# Patient Record
Sex: Male | Born: 1935 | Race: White | Hispanic: No | Marital: Married | Smoking: Never smoker
Health system: Southern US, Community
[De-identification: ages and names within clinical notes are randomized; demographics above are authoritative.]

## PROBLEM LIST (undated history)

## (undated) ENCOUNTER — Emergency Department: Admission: EM | Payer: PPO

## (undated) DIAGNOSIS — C801 Malignant (primary) neoplasm, unspecified: Secondary | ICD-10-CM

## (undated) DIAGNOSIS — K219 Gastro-esophageal reflux disease without esophagitis: Secondary | ICD-10-CM

## (undated) DIAGNOSIS — K635 Polyp of colon: Secondary | ICD-10-CM

## (undated) DIAGNOSIS — M1990 Unspecified osteoarthritis, unspecified site: Secondary | ICD-10-CM

## (undated) DIAGNOSIS — I493 Ventricular premature depolarization: Secondary | ICD-10-CM

## (undated) DIAGNOSIS — R002 Palpitations: Secondary | ICD-10-CM

## (undated) DIAGNOSIS — R001 Bradycardia, unspecified: Secondary | ICD-10-CM

## (undated) DIAGNOSIS — R251 Tremor, unspecified: Secondary | ICD-10-CM

## (undated) DIAGNOSIS — L570 Actinic keratosis: Secondary | ICD-10-CM

## (undated) DIAGNOSIS — F329 Major depressive disorder, single episode, unspecified: Secondary | ICD-10-CM

## (undated) DIAGNOSIS — S81811A Laceration without foreign body, right lower leg, initial encounter: Secondary | ICD-10-CM

## (undated) DIAGNOSIS — F419 Anxiety disorder, unspecified: Secondary | ICD-10-CM

## (undated) DIAGNOSIS — J449 Chronic obstructive pulmonary disease, unspecified: Secondary | ICD-10-CM

## (undated) DIAGNOSIS — N189 Chronic kidney disease, unspecified: Secondary | ICD-10-CM

## (undated) DIAGNOSIS — T7840XA Allergy, unspecified, initial encounter: Secondary | ICD-10-CM

## (undated) DIAGNOSIS — R55 Syncope and collapse: Secondary | ICD-10-CM

## (undated) DIAGNOSIS — I471 Supraventricular tachycardia, unspecified: Secondary | ICD-10-CM

## (undated) DIAGNOSIS — J310 Chronic rhinitis: Secondary | ICD-10-CM

## (undated) DIAGNOSIS — E039 Hypothyroidism, unspecified: Secondary | ICD-10-CM

## (undated) DIAGNOSIS — A4901 Methicillin susceptible Staphylococcus aureus infection, unspecified site: Secondary | ICD-10-CM

## (undated) DIAGNOSIS — Z974 Presence of external hearing-aid: Secondary | ICD-10-CM

## (undated) DIAGNOSIS — F32A Depression, unspecified: Secondary | ICD-10-CM

## (undated) DIAGNOSIS — Z87442 Personal history of urinary calculi: Secondary | ICD-10-CM

## (undated) DIAGNOSIS — Z9289 Personal history of other medical treatment: Secondary | ICD-10-CM

## (undated) DIAGNOSIS — I2510 Atherosclerotic heart disease of native coronary artery without angina pectoris: Secondary | ICD-10-CM

## (undated) DIAGNOSIS — I499 Cardiac arrhythmia, unspecified: Secondary | ICD-10-CM

## (undated) DIAGNOSIS — Z972 Presence of dental prosthetic device (complete) (partial): Secondary | ICD-10-CM

## (undated) DIAGNOSIS — C439 Malignant melanoma of skin, unspecified: Secondary | ICD-10-CM

## (undated) DIAGNOSIS — G250 Essential tremor: Secondary | ICD-10-CM

## (undated) DIAGNOSIS — E079 Disorder of thyroid, unspecified: Secondary | ICD-10-CM

## (undated) HISTORY — DX: Methicillin susceptible Staphylococcus aureus infection, unspecified site: A49.01

## (undated) HISTORY — PX: MELANOMA EXCISION: SHX5266

## (undated) HISTORY — DX: Depression, unspecified: F32.A

## (undated) HISTORY — DX: Disorder of thyroid, unspecified: E07.9

## (undated) HISTORY — DX: Actinic keratosis: L57.0

## (undated) HISTORY — PX: ADENOIDECTOMY: SUR15

## (undated) HISTORY — DX: Malignant melanoma of skin, unspecified: C43.9

## (undated) HISTORY — DX: Essential tremor: G25.0

## (undated) HISTORY — DX: Syncope and collapse: R55

## (undated) HISTORY — DX: Palpitations: R00.2

## (undated) HISTORY — DX: Personal history of other medical treatment: Z92.89

## (undated) HISTORY — PX: PATELLECTOMY: SHX1022

## (undated) HISTORY — DX: Anxiety disorder, unspecified: F41.9

## (undated) HISTORY — DX: Tremor, unspecified: R25.1

## (undated) HISTORY — DX: Ventricular premature depolarization: I49.3

## (undated) HISTORY — PX: BRAIN SURGERY: SHX531

## (undated) HISTORY — DX: Gastro-esophageal reflux disease without esophagitis: K21.9

## (undated) HISTORY — DX: Bradycardia, unspecified: R00.1

## (undated) HISTORY — DX: Polyp of colon: K63.5

## (undated) HISTORY — DX: Supraventricular tachycardia, unspecified: I47.10

## (undated) HISTORY — DX: Unspecified osteoarthritis, unspecified site: M19.90

## (undated) HISTORY — DX: Chronic kidney disease, unspecified: N18.9

## (undated) HISTORY — DX: Laceration without foreign body, right lower leg, initial encounter: S81.811A

## (undated) HISTORY — DX: Atherosclerotic heart disease of native coronary artery without angina pectoris: I25.10

## (undated) HISTORY — DX: Major depressive disorder, single episode, unspecified: F32.9

## (undated) HISTORY — DX: Allergy, unspecified, initial encounter: T78.40XA

## (undated) HISTORY — DX: Malignant (primary) neoplasm, unspecified: C80.1

## (undated) HISTORY — PX: TONSILLECTOMY: SUR1361

## (undated) HISTORY — PX: OTHER SURGICAL HISTORY: SHX169

---

## 2000-03-13 DIAGNOSIS — C801 Malignant (primary) neoplasm, unspecified: Secondary | ICD-10-CM

## 2000-03-13 HISTORY — DX: Malignant (primary) neoplasm, unspecified: C80.1

## 2001-03-13 HISTORY — PX: JOINT REPLACEMENT: SHX530

## 2010-05-10 DIAGNOSIS — Z85820 Personal history of malignant melanoma of skin: Secondary | ICD-10-CM | POA: Insufficient documentation

## 2010-05-10 DIAGNOSIS — L570 Actinic keratosis: Secondary | ICD-10-CM | POA: Insufficient documentation

## 2010-05-10 DIAGNOSIS — L820 Inflamed seborrheic keratosis: Secondary | ICD-10-CM | POA: Insufficient documentation

## 2012-12-11 ENCOUNTER — Ambulatory Visit: Payer: Self-pay | Admitting: *Deleted

## 2013-04-28 ENCOUNTER — Encounter: Payer: Self-pay | Admitting: Adult Health

## 2013-04-28 ENCOUNTER — Ambulatory Visit (INDEPENDENT_AMBULATORY_CARE_PROVIDER_SITE_OTHER): Payer: Commercial Managed Care - HMO | Admitting: Adult Health

## 2013-04-28 VITALS — BP 108/62 | HR 62 | Temp 98.0°F | Resp 16 | Ht 75.5 in | Wt 183.5 lb

## 2013-04-28 DIAGNOSIS — E291 Testicular hypofunction: Secondary | ICD-10-CM

## 2013-04-28 DIAGNOSIS — R7989 Other specified abnormal findings of blood chemistry: Secondary | ICD-10-CM

## 2013-04-28 DIAGNOSIS — F341 Dysthymic disorder: Secondary | ICD-10-CM

## 2013-04-28 DIAGNOSIS — F418 Other specified anxiety disorders: Secondary | ICD-10-CM | POA: Insufficient documentation

## 2013-04-28 DIAGNOSIS — R0982 Postnasal drip: Secondary | ICD-10-CM

## 2013-04-28 DIAGNOSIS — J310 Chronic rhinitis: Secondary | ICD-10-CM | POA: Insufficient documentation

## 2013-04-28 NOTE — Progress Notes (Signed)
Pre visit review using our clinic review tool, if applicable. No additional management support is needed unless otherwise documented below in the visit note. 

## 2013-04-28 NOTE — Progress Notes (Signed)
Patient ID: Theodore [name], male   DOB: Jul 19, 1929, 80 y.o.   MRN: [id number]    Subjective:    Patient ID: Theodore [name], male    DOB: Mar 15, 1929, 80 y.o.   MRN: [id number]  HPI  Theodore [name] is a pleasant 80 y/o male who presents to clinic to establish care. He reports a hx of depression mainly situational. Currently on medication. Depression related to adjusting to retirement. He has always been very active and feels he is not contributing. 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 Pt also reports hx of low testosterone and has been on replacement tx for ~ 2 years. Reports having his medicare wellness exam back in October. Problems with arthritis but well controlled. He is planning trip to [place] in March to visit his daughter.    Past Medical History  Diagnosis Date  . Arthritis   . Cancer 2002    melanoma right ear  . Depression   . Thyroid disease     hypothyroid  . Colon polyp   . Tremors of nervous system     Benign     Past Surgical History  Procedure Laterality Date  . Joint replacement Right 2003    knee  . Melanoma excision Right     ear. Followed by Dr. Nehemiah Massed     Family History  Problem Relation Age of Onset  . Hypertension Mother   . Heart disease Mother     CHF  . Heart disease Father   . Cancer Sister     melanoma     History   Social History  . Marital Status: Married    Spouse Name: [name]    Number of Children: 4  . Years of Education: 16   Occupational History  . Retired     Architect -    Social History Main Topics  . Smoking status: Never Smoker   . Smokeless tobacco: Never Used  . Alcohol Use: Yes     Comment: 1 glass of wine daily  . Drug Use: No  . Sexual Activity: Not on file   Other Topics Concern  . Not on file   Social History Narrative   Theodore [name] grew up in [place], [place]. 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 He and his wife are living at [facility]. He is very active at [facility] [employer] [facility]. He enjoys music.      Review of Systems  Constitutional: Negative.   HENT: Positive for hearing loss, postnasal drip and rhinorrhea.        Wears hearing aids  Eyes: Negative.   Respiratory: Negative.   Cardiovascular: Negative.   Gastrointestinal: Negative.   Endocrine: Negative.   Genitourinary: Negative.   Musculoskeletal: Positive for arthralgias (arthritis in several joints. Well controlled).  Skin: Negative.   Allergic/Immunologic: Negative.   Neurological: Negative.   Hematological: Negative.   Psychiatric/Behavioral: Negative for suicidal ideas and sleep disturbance. The patient is nervous/anxious (Depression - having trouble adjusting to retirement).        Objective:  There were no vitals taken for this visit.   Physical Exam  Constitutional: He is oriented to person, place, and time. He appears well-developed and well-nourished. No distress.  HENT:  Head: Normocephalic and atraumatic.  Right  Ear: External ear normal.  Left Ear: External ear normal.  Nose: Nose normal.  Mouth/Throat: Oropharynx is clear and moist.  Eyes: Conjunctivae and EOM are normal. Pupils are equal, round, and reactive to light.  Neck: Normal range of motion. Neck supple. No tracheal deviation present. No thyromegaly present.  Cardiovascular: Normal rate, regular rhythm, normal heart sounds and intact distal pulses.  Exam reveals no gallop and no friction rub.   No murmur heard. Pulmonary/Chest: Effort normal and breath sounds normal. No respiratory distress. He has no wheezes. He has no rales.  Abdominal: Soft. Bowel sounds are normal. He exhibits no distension and no mass. There is no tenderness. There is no rebound and no guarding.    Musculoskeletal: Normal range of motion. He exhibits no edema and no tenderness.  Lymphadenopathy:    He has no cervical adenopathy.  Neurological: He is alert and oriented to person, place, and time. He has normal reflexes. No cranial nerve deficit. Coordination normal.  Skin: Skin is warm and dry.  Psychiatric: He has a normal mood and affect. His behavior is normal. Judgment and thought content normal.       Assessment & Plan:   1. Depression with anxiety Doing well on currents meds. Reports these occur based on certain situations. He is having difficulty with adjusting to retirement. He stays very busy with exercise, activities within his retirement community and volunteer work. He also does some consulting work. Continue to follow.  2. Low serum testosterone level Last time levels were elevated. Will recheck testosterone.  - Testosterone; Future  3. Post-nasal drip Start nasonex. Sample provided. If this help will call in prescription for same.

## 2013-04-28 NOTE — Patient Instructions (Signed)
   Thank you for choosing [facility] for your health care needs.  Please have your labs drawn for your testosterone level.  The results will be available through MyChart for your convenience. Please remember to activate this. The activation code is located at the end of this form.  Nasonex 2 sprays into each nostril daily. If this helps I will send in a prescription for you.

## 2013-04-29 ENCOUNTER — Other Ambulatory Visit: Payer: Commercial Managed Care - HMO

## 2013-04-30 ENCOUNTER — Other Ambulatory Visit (INDEPENDENT_AMBULATORY_CARE_PROVIDER_SITE_OTHER): Payer: Commercial Managed Care - HMO

## 2013-04-30 ENCOUNTER — Encounter: Payer: Self-pay | Admitting: *Deleted

## 2013-04-30 DIAGNOSIS — R7989 Other specified abnormal findings of blood chemistry: Secondary | ICD-10-CM

## 2013-04-30 DIAGNOSIS — E291 Testicular hypofunction: Secondary | ICD-10-CM

## 2013-04-30 LAB — TESTOSTERONE: Testosterone: 673.38 ng/dL (ref 350.00–890.00)

## 2013-05-07 ENCOUNTER — Other Ambulatory Visit: Payer: Self-pay | Admitting: Adult Health

## 2013-05-07 ENCOUNTER — Telehealth: Payer: Self-pay | Admitting: Adult Health

## 2013-05-07 MED ORDER — BUPROPION HCL ER (XL) 300 MG PO TB24: 300.0000 mg | ORAL_TABLET | Freq: Every day | ORAL | Status: DC

## 2013-05-07 MED ORDER — LEVOTHYROXINE SODIUM 100 MCG PO TABS: 100.0000 ug | ORAL_TABLET | Freq: Every day | ORAL | Status: DC

## 2013-05-07 MED ORDER — PHENYTOIN SODIUM EXTENDED 100 MG PO CAPS: 200.0000 mg | ORAL_CAPSULE | Freq: Every day | ORAL | Status: DC

## 2013-05-07 NOTE — Telephone Encounter (Signed)
yes

## 2013-05-07 NOTE — Telephone Encounter (Signed)
Ok refill phenytoin?

## 2013-05-07 NOTE — Telephone Encounter (Signed)
Pt came into office, states [facility] advised him to come to the office to let us know that we should be receiving a fax from them.  Pt used [facility] on [address] Previously but will now have [facility] for some things.    [facility]: Bupropion Phenetoin Levothyroxine

## 2013-05-09 ENCOUNTER — Telehealth: Payer: Self-pay | Admitting: Adult Health

## 2013-05-09 MED ORDER — LEVOTHYROXINE SODIUM 100 MCG PO TABS: 100.0000 ug | ORAL_TABLET | Freq: Every day | ORAL | Status: DC

## 2013-05-09 NOTE — Telephone Encounter (Signed)
Pt came in today needing to get refill on the follow rx.  He is going out of town on Tuesday and needs rx prior to that.  He has mail order coming but needs before  levothroxine 100 mcg  Tablet   [facility] days [facility]

## 2013-07-15 ENCOUNTER — Other Ambulatory Visit: Payer: Self-pay | Admitting: Adult Health

## 2013-07-17 ENCOUNTER — Other Ambulatory Visit: Payer: Self-pay

## 2013-07-17 ENCOUNTER — Telehealth: Payer: Self-pay

## 2013-07-17 MED ORDER — TESTOSTERONE CYPIONATE 100 MG/ML IM SOLN: INTRAMUSCULAR | Status: DC

## 2013-07-17 NOTE — Telephone Encounter (Signed)
Rx was not sent to [facility], Rx was printed and needed approval from [facility] . The approval was denied. Patient needs to see the provider that prescribed the medication.

## 2013-07-17 NOTE — Telephone Encounter (Signed)
Patient called asking for a refill for his Testostrone CYP injection 2,000mg . Patient last lab was done Feb.2015. Rx was sent to [facility] mail order pharmacy as patient requested.

## 2013-07-17 NOTE — Telephone Encounter (Signed)
Spoke with Patient and notifed him that his Rx request was denied. Asked patient if he would like a referral for urologist. Patient stated no at this trime. He will contact the provider ( Dr. Ronny Flurry [place]) that has been prescribing it to him for years. Told patient to call back if he changes his mind about getting a referral.

## 2013-07-18 ENCOUNTER — Telehealth: Payer: Self-pay

## 2013-07-18 ENCOUNTER — Other Ambulatory Visit: Payer: Self-pay | Admitting: Adult Health

## 2013-07-18 DIAGNOSIS — R7989 Other specified abnormal findings of blood chemistry: Secondary | ICD-10-CM

## 2013-07-18 NOTE — Telephone Encounter (Signed)
Patient called yesterday requesting a Rx for testosterone injection and we denied his request. Offer to put in a referral for the patient to see a urologist. Patient would like a referral at this time.

## 2013-07-31 ENCOUNTER — Ambulatory Visit (INDEPENDENT_AMBULATORY_CARE_PROVIDER_SITE_OTHER): Payer: Commercial Managed Care - HMO | Admitting: Adult Health

## 2013-07-31 ENCOUNTER — Encounter: Payer: Self-pay | Admitting: Adult Health

## 2013-07-31 ENCOUNTER — Encounter (INDEPENDENT_AMBULATORY_CARE_PROVIDER_SITE_OTHER): Payer: Self-pay

## 2013-07-31 VITALS — BP 118/60 | HR 72 | Temp 98.2°F | Resp 14 | Ht 75.5 in | Wt 182.0 lb

## 2013-07-31 DIAGNOSIS — E559 Vitamin D deficiency, unspecified: Secondary | ICD-10-CM | POA: Insufficient documentation

## 2013-07-31 DIAGNOSIS — R5383 Other fatigue: Secondary | ICD-10-CM | POA: Insufficient documentation

## 2013-07-31 DIAGNOSIS — R5381 Other malaise: Secondary | ICD-10-CM

## 2013-07-31 DIAGNOSIS — F341 Dysthymic disorder: Secondary | ICD-10-CM

## 2013-07-31 DIAGNOSIS — R0982 Postnasal drip: Secondary | ICD-10-CM

## 2013-07-31 DIAGNOSIS — F418 Other specified anxiety disorders: Secondary | ICD-10-CM

## 2013-07-31 LAB — CBC WITH DIFFERENTIAL/PLATELET
Basophils Absolute: 0 10*3/uL (ref 0.0–0.1)
Basophils Relative: 0.5 % (ref 0.0–3.0)
Eosinophils Absolute: 0.2 10*3/uL (ref 0.0–0.7)
Eosinophils Relative: 3.2 % (ref 0.0–5.0)
HCT: 48.1 % (ref 39.0–52.0)
Hemoglobin: 16.8 g/dL (ref 13.0–17.0)
Lymphocytes Relative: 16.1 % (ref 12.0–46.0)
Lymphs Abs: 0.8 10*3/uL (ref 0.7–4.0)
MCHC: 34.9 g/dL (ref 30.0–36.0)
MCV: 98.3 fl (ref 78.0–100.0)
Monocytes Absolute: 0.4 10*3/uL (ref 0.1–1.0)
Monocytes Relative: 7 % (ref 3.0–12.0)
Neutro Abs: 3.8 10*3/uL (ref 1.4–7.7)
Neutrophils Relative %: 73.2 % (ref 43.0–77.0)
Platelets: 210 10*3/uL (ref 150.0–400.0)
RBC: 4.89 Mil/uL (ref 4.22–5.81)
RDW: 12.8 % (ref 11.5–15.5)
WBC: 5.2 10*3/uL (ref 4.0–10.5)

## 2013-07-31 LAB — BASIC METABOLIC PANEL
BUN: 21 mg/dL (ref 6–23)
CO2: 28 mEq/L (ref 19–32)
Calcium: 8.9 mg/dL (ref 8.4–10.5)
Chloride: 104 mEq/L (ref 96–112)
Creatinine, Ser: 1.3 mg/dL (ref 0.4–1.5)
GFR: 58.75 mL/min — ABNORMAL LOW (ref >60.00)
Glucose, Bld: 99 mg/dL (ref 70–99)
Potassium: 4.5 mEq/L (ref 3.5–5.1)
Sodium: 139 mEq/L (ref 135–145)

## 2013-07-31 LAB — TSH: TSH: 0.44 u[IU]/mL (ref 0.35–4.50)

## 2013-07-31 NOTE — Progress Notes (Signed)
Pre visit review using our clinic review tool, if applicable. No additional management support is needed unless otherwise documented below in the visit note. 

## 2013-07-31 NOTE — Patient Instructions (Addendum)
  Please have your blood drawn prior to leaving the office.  We will contact you with results once they are available.  For allergies try taking Allegra. This is over the counter.  Continue using the nettie pot.

## 2013-07-31 NOTE — Progress Notes (Signed)
Subjective:    Patient ID: Theodore [name], male    DOB: 01/27/1930, 80 y.o.   MRN: [id number]  HPI Theodore [name] is a pleasant 80 y/o male who presents to clinic with ongoing fatigue. Hx of low testosterone on weekly replacement, depression with anxiety (on wellbutrin and alprazolam). Has been taking alprazolam as needed with good results. Has only had to take it approximately twice in the last month. He has an appointment with urology next week to manage his testosterone. Fatigue is atypical for him. He exercises regularly and has been noticing feeling completely wiped out after his workout. He denies hx of anemia. Has hypothyroidism on levothyroxine. He is taking medication exactly as prescribed.  Pt also reports ongoing post nasal drip. Using nettie pot with improvement but the nasacort did not help any. Feels the post nasal drip causing cough. No fever, chills, shortness of breath. He does not have any sinus pressure or pain.   Past Medical History  Diagnosis Date  . Arthritis   . Cancer 2002    melanoma right ear  . Depression   . Thyroid disease     hypothyroid  . Colon polyp   . Tremors of nervous system     Benign    Review of Systems  Constitutional: Positive for fatigue. Negative for fever and chills.  HENT: Negative.   Eyes: Negative.   Respiratory: Negative.   Cardiovascular: Negative.   Gastrointestinal: Negative.   Endocrine: Negative.   Genitourinary: Negative.   Musculoskeletal: Negative.        Exercises regularly but feels wiped out following. This is a new finding for him.  Skin: Negative.   Allergic/Immunologic: Negative.   Neurological: Negative.   Hematological: Negative.   Psychiatric/Behavioral: The patient is nervous/anxious (controlled with alprazolam).        Depression controlled with wellbutrin       Objective:   Physical Exam  Constitutional: He is oriented to person, place, and time. He appears well-developed and well-nourished. No distress.    HENT:  Head: Normocephalic and atraumatic.  Eyes: Conjunctivae and EOM are normal.  Cardiovascular: Normal rate, regular rhythm, normal heart sounds and intact distal pulses.  Exam reveals no gallop and no friction rub.   No murmur heard. Pulmonary/Chest: Effort normal and breath sounds normal. No respiratory distress. He has no wheezes. He has no rales.  Musculoskeletal: Normal range of motion.  Neurological: He is alert and oriented to person, place, and time.  Skin: Skin is warm and dry.  Psychiatric: He has a normal mood and affect. His behavior is normal. Judgment and thought content normal.   BP 118/60  Pulse 72  Temp(Src) 98.2 F (36.8 C) (Oral)  Resp 14  Ht 6' 3.5" (1.918 m)  Wt 182 lb (82.555 kg)  BMI 22.44 kg/m2  SpO2 97%     Assessment & Plan:   1. Fatigue Multifactorial. Could be his testosterone which he has appointment next week. Last testosterone level was in the 600s. I will check his thyroid and for anemia. Make sure his electrolytes are stable. Fatigue may also be symptom of depression. Still has some symptoms but feels they are better controlled.  - TSH - CBC with Differential - Basic metabolic panel  2. Vitamin D deficiency Has been on 5000 units of D3. Will check levels today to make sure they are not too elevated. - Vit D  25 hydroxy (rtn osteoporosis monitoring)  3. Depression with anxiety As above, he is improved. Anxiety  better controlled with less episodes occuring. Exercise is a good outlet for him although his fatigue is hindering this a bit. Continue to follow.  4. Post-nasal drip Nasocort is not helping. I have asked patient to try Allegra without the Decongestant. Continue with the nettie pot. Allergy symptoms can also be contributing to him feeling somewhat run down. Unfortunately, pollen levels have been very high. Need to avoid exposure as much as he is able.

## 2013-08-01 LAB — VITAMIN D 25 HYDROXY (VIT D DEFICIENCY, FRACTURES): Vit D, 25-Hydroxy: 42 ng/mL (ref 30–89)

## 2013-08-07 ENCOUNTER — Other Ambulatory Visit: Payer: Self-pay | Admitting: Adult Health

## 2013-08-08 DIAGNOSIS — E291 Testicular hypofunction: Secondary | ICD-10-CM | POA: Insufficient documentation

## 2013-08-08 DIAGNOSIS — N138 Other obstructive and reflux uropathy: Secondary | ICD-10-CM | POA: Insufficient documentation

## 2013-08-08 DIAGNOSIS — N401 Enlarged prostate with lower urinary tract symptoms: Secondary | ICD-10-CM | POA: Insufficient documentation

## 2013-09-08 ENCOUNTER — Encounter: Payer: Self-pay | Admitting: Adult Health

## 2013-09-08 ENCOUNTER — Ambulatory Visit (INDEPENDENT_AMBULATORY_CARE_PROVIDER_SITE_OTHER): Payer: Commercial Managed Care - HMO | Admitting: Adult Health

## 2013-09-08 VITALS — BP 120/62 | HR 66 | Temp 97.9°F | Resp 16 | Wt 183.5 lb

## 2013-09-08 DIAGNOSIS — E291 Testicular hypofunction: Secondary | ICD-10-CM

## 2013-09-08 DIAGNOSIS — R0982 Postnasal drip: Secondary | ICD-10-CM

## 2013-09-08 DIAGNOSIS — R5383 Other fatigue: Secondary | ICD-10-CM

## 2013-09-08 DIAGNOSIS — F418 Other specified anxiety disorders: Secondary | ICD-10-CM

## 2013-09-08 DIAGNOSIS — R5381 Other malaise: Secondary | ICD-10-CM

## 2013-09-08 DIAGNOSIS — R7989 Other specified abnormal findings of blood chemistry: Secondary | ICD-10-CM

## 2013-09-08 DIAGNOSIS — F341 Dysthymic disorder: Secondary | ICD-10-CM

## 2013-09-08 MED ORDER — IPRATROPIUM BROMIDE 0.03 % NA SOLN: 2.0000 | Freq: Two times a day (BID) | NASAL | Status: DC

## 2013-09-08 NOTE — Patient Instructions (Addendum)
  Return on Thursday or Monday for your labs. Remember to schedule a lab appointment.  Start Atrovent nasal spray 2 sprays into each nostril every 12 hours.  If symptoms are not improved I will refer you to ENT (ear, nose and throat) specialist.  Continue increasing your physical activity gradually. Being on antibiotics can make you feel sluggish and without energy.  Return for follow up evaluation in 6 month or sooner if necessary.

## 2013-09-08 NOTE — Progress Notes (Signed)
Pre visit review using our clinic review tool, if applicable. No additional management support is needed unless otherwise documented below in the visit note. 

## 2013-09-08 NOTE — Progress Notes (Signed)
Patient ID: Theodore [name], male   DOB: 1929-10-02, 80 y.o.   MRN: [id number]    Subjective:    Patient ID: Theodore [name], male    DOB: [birth year], 80 y.o.   MRN: [id number]  HPI  Pt is a pleasant 80 y/o male who presents to clinic for follow up on several chronic problems:  1. Low testosterone Pt reports that he is being followed by urology for same. Reports that his previous PCP prescribed .05 mg weekly to maintain his levels more steady. He is currently on this dose every 10 days. Given recent studies about testosterone and cardiac implications, he reports that Urologist was reluctant to change his dose. He would like to have his level drawn midweek to see if they drop significantly.  2. Other fatigue Has been experiencing fatigue. Reports that he had a root canal on 08/18/13 and was started on Amoxicillin tid. He felt totally wiped out. No energy. He is very active. Exercises daily and was unable to do anything. He reports being off antibiotic for 14 days now. Saturday he began to feel improvement in energy. He has attended 2 exercise classes today and did very well.  3. Post-nasal drip Still having considerable post nasal drip and rhinorrhea. Tried allegra as we discussed but this did not help. Also tried flonase but this was also ineffective. He reports that when he is exercising and has to lie on his back he feels the drainage trickling and makes him cough  5. Depression with anxiety This is controlled at present. Only takes the alprazolam occasionally. He rarely feels he needs this. Has considered weaning off wellbutrin but does not want to feel like he was feeling a year ago.    Past Medical History  Diagnosis Date  . Arthritis   . Cancer 2002    melanoma right ear  . Depression   . Thyroid disease     hypothyroid  . Colon polyp   . Tremors of nervous system     Benign    Current Outpatient Prescriptions on File Prior to Visit  Medication Sig Dispense Refill  . ALPRAZolam  (XANAX) 0.25 MG tablet Take 0.25 mg by mouth daily as needed for anxiety.      [name] buPROPion (WELLBUTRIN XL) 300 MG 24 hr tablet TAKE 1 TABLET EVERY DAY  90 tablet  0  . cholecalciferol (VITAMIN D) 1000 UNITS tablet Take 1,000 Units by mouth daily.      [name] co-enzyme Q-10 30 MG capsule Take 30 mg by mouth 3 (three) times daily.      . COD LIVER OIL PO Take by mouth.      . cyanocobalamin 500 MCG tablet Take 500 mcg by mouth daily.      . Flaxseed, Linseed, (FLAX SEEDS PO) Take by mouth.      . Hydrogen Peroxide 30 % SOLN by Does not apply route. 5 drops per day in water (food grade)      . Iodine, Kelp, 0.15 MG TABS Take 1 tablet by mouth daily.      [name] levothyroxine (SYNTHROID, LEVOTHROID) 100 MCG tablet TAKE 1 TABLET DAILY BEFORE BREAKFAST.  90 tablet  1  . magnesium oxide (MAG-OX) 400 MG tablet Take 400 mg by mouth daily.      [name] OVER THE COUNTER MEDICATION Tumeric daily      . OVER THE COUNTER MEDICATION Standard Process- Cardio Plus      . OVER THE COUNTER MEDICATION Standard Process- [place]      .  OVER THE COUNTER MEDICATION Standard Process- Folic Acid      . OVER THE COUNTER MEDICATION Standard PRocess- Prosynbiotic      . phenytoin (DILANTIN) 100 MG ER capsule Take 2 capsules (200 mg total) by mouth daily.  60 capsule  6  . S-Adenosylmethionine (SAM-E PO) Take by mouth.      . testosterone cypionate (DEPOTESTOTERONE CYPIONATE) 100 MG/ML injection For IM use only, every 10 days Inject 50 mg into the muscle. For IM use only, every 10 days  10 mL  5  . vitamin E (VITAMIN E) 400 UNIT capsule Take 400 Units by mouth daily.       No current facility-administered medications on file prior to visit.     Review of Systems  Constitutional: Positive for fatigue (improving). Negative for fever and chills.  HENT: Positive for postnasal drip and rhinorrhea.   Eyes: Negative.   Respiratory: Positive for cough (secondary to post nasal drip).   Cardiovascular: Negative.   Gastrointestinal:  Negative.   Endocrine: Negative.   Genitourinary: Negative.   Musculoskeletal: Negative.   Skin: Negative.   Allergic/Immunologic: Negative.   Neurological: Positive for tremors (essential tremor).  Hematological: Negative.   Psychiatric/Behavioral: Negative.   All other systems reviewed and are negative.      Objective:  BP 120/62  Pulse 66  Temp(Src) 97.9 F (36.6 C) (Oral)  Resp 16  Wt 183 lb 8 oz (83.235 kg)  SpO2 98%   Physical Exam  Constitutional: He is oriented to person, place, and time. He appears well-developed and well-nourished. No distress.  HENT:  Head: Normocephalic and atraumatic.  Eyes: Conjunctivae and EOM are normal.  Cardiovascular: Normal rate, regular rhythm, normal heart sounds and intact distal pulses.  Exam reveals no gallop and no friction rub.   No murmur heard. Pulmonary/Chest: Effort normal and breath sounds normal. No respiratory distress. He has no wheezes. He has no rales.  Musculoskeletal: Normal range of motion.  Neurological: He is alert and oriented to person, place, and time.  Skin: Skin is warm and dry.  Psychiatric: He has a normal mood and affect. His behavior is normal. Judgment and thought content normal.      Assessment & Plan:   1. Low testosterone Check testosterone level midweek after administration to see where his levels are. He may benefit from splitting the dose and administering half dose weekly. He will return either on Thursday or Monday of next week.  - Testosterone; Future  2. Other fatigue Improving. I have asked him to gradually increase activity. Not to overdo it. Recent labs all looked good.  3. Post-nasal drip Try atrovent nasal spray. Use 2 sprays into each nostril every 12 hours. If no improvement will refer him to ENT.  4. Depression with anxiety Stable on medication. Continue to follow.

## 2013-09-15 ENCOUNTER — Encounter: Payer: Self-pay | Admitting: Adult Health

## 2013-09-15 ENCOUNTER — Other Ambulatory Visit (INDEPENDENT_AMBULATORY_CARE_PROVIDER_SITE_OTHER): Payer: Commercial Managed Care - HMO

## 2013-09-15 DIAGNOSIS — R7989 Other specified abnormal findings of blood chemistry: Secondary | ICD-10-CM

## 2013-09-15 DIAGNOSIS — E291 Testicular hypofunction: Secondary | ICD-10-CM

## 2013-09-15 LAB — TESTOSTERONE: Testosterone: 1037.13 ng/dL — ABNORMAL HIGH (ref 300.00–890.00)

## 2013-11-04 ENCOUNTER — Other Ambulatory Visit: Payer: Self-pay | Admitting: Adult Health

## 2013-11-04 ENCOUNTER — Encounter: Payer: Self-pay | Admitting: Adult Health

## 2013-11-04 DIAGNOSIS — Z85820 Personal history of malignant melanoma of skin: Secondary | ICD-10-CM

## 2013-11-04 DIAGNOSIS — Z9889 Other specified postprocedural states: Secondary | ICD-10-CM

## 2013-11-04 NOTE — Telephone Encounter (Signed)
Patient requesting a referral for [facility]. Please advise.

## 2013-11-12 ENCOUNTER — Telehealth: Payer: Self-pay

## 2013-11-12 NOTE — Telephone Encounter (Signed)
The patient is approved to see Dr.Tara Nicole Kindred at [facility] [phone] Good for 6 visits through 05/13/14

## 2013-12-02 ENCOUNTER — Other Ambulatory Visit: Payer: Self-pay | Admitting: *Deleted

## 2013-12-02 MED ORDER — LEVOTHYROXINE SODIUM 100 MCG PO TABS: ORAL_TABLET | ORAL | Status: DC

## 2013-12-15 ENCOUNTER — Encounter: Payer: Self-pay | Admitting: Internal Medicine

## 2013-12-15 MED ORDER — PHENYTOIN SODIUM EXTENDED 100 MG PO CAPS: 200.0000 mg | ORAL_CAPSULE | Freq: Every day | ORAL | Status: DC

## 2013-12-15 NOTE — Telephone Encounter (Signed)
Ok to refill,  Refill sent  

## 2013-12-15 NOTE — Telephone Encounter (Signed)
Ok refill? 

## 2014-01-12 DIAGNOSIS — D039 Melanoma in situ, unspecified: Secondary | ICD-10-CM

## 2014-01-12 HISTORY — DX: Melanoma in situ, unspecified: D03.9

## 2014-01-14 ENCOUNTER — Ambulatory Visit (INDEPENDENT_AMBULATORY_CARE_PROVIDER_SITE_OTHER): Payer: Commercial Managed Care - HMO | Admitting: *Deleted

## 2014-01-14 ENCOUNTER — Ambulatory Visit (INDEPENDENT_AMBULATORY_CARE_PROVIDER_SITE_OTHER): Payer: Commercial Managed Care - HMO | Admitting: Internal Medicine

## 2014-01-14 ENCOUNTER — Encounter: Payer: Self-pay | Admitting: Internal Medicine

## 2014-01-14 ENCOUNTER — Telehealth: Payer: Self-pay | Admitting: Internal Medicine

## 2014-01-14 VITALS — BP 124/66 | HR 49 | Temp 97.4°F | Resp 14 | Ht 75.5 in | Wt 185.5 lb

## 2014-01-14 DIAGNOSIS — E291 Testicular hypofunction: Secondary | ICD-10-CM

## 2014-01-14 DIAGNOSIS — R7989 Other specified abnormal findings of blood chemistry: Secondary | ICD-10-CM

## 2014-01-14 DIAGNOSIS — Z79899 Other long term (current) drug therapy: Secondary | ICD-10-CM

## 2014-01-14 DIAGNOSIS — J310 Chronic rhinitis: Secondary | ICD-10-CM

## 2014-01-14 DIAGNOSIS — Z23 Encounter for immunization: Secondary | ICD-10-CM

## 2014-01-14 MED ORDER — IPRATROPIUM BROMIDE 0.06 % NA SOLN: 2.0000 | Freq: Four times a day (QID) | NASAL | Status: DC

## 2014-01-14 NOTE — Patient Instructions (Signed)
We are increasing the strength and dosing of the atrovent nasal spray that Theodore [name]  tried to 2 squirts 4 times daily (new rx sent)  Add 25 mg of generic benadryl at bedtime only  Please suspend your testsoterone for 6 weeks to see if you notice a major difference in your wellbeing  Let me know if you want to resume it

## 2014-01-14 NOTE — Telephone Encounter (Signed)
Theodore [name] wasn't sure if he needed lab work in the next 6wks or a follow-up appt. I didn't see anything notated in his Summary. If he needs labs or a follow-up appt, please let him know. Thank you.

## 2014-01-14 NOTE — Progress Notes (Signed)
Pre-visit discussion using our clinic review tool. No additional management support is needed unless otherwise documented below in the visit note.  

## 2014-01-14 NOTE — Telephone Encounter (Signed)
Per Dr. Derrel Nip, it will depend on when/if he restarts his Testosterone. If he restarts his Testosterone, he will need to recheck labs 6 weeks from restarting.

## 2014-01-14 NOTE — Progress Notes (Signed)
Patient ID: Theodore [name], male   DOB: 1929/04/30, 80 y.o.   MRN: [id number]   Patient Active Problem List   Diagnosis Date Noted  . Long-term use of high-risk medication 01/16/2014  . Vitamin D deficiency 07/31/2013  . Fatigue 07/31/2013  . Depression with anxiety 04/28/2013  . Low serum testosterone level 04/28/2013  . Nonallergic rhinitis 04/28/2013    Subjective:  CC:   Chief Complaint  Patient presents with  . Acute Visit    sinus drainage interferring with sleep, affecting vocal cords, happens year round.    HPI:   Theodore [name] is a 80 y.o. male who presents for   Chronic nonpurulent rhinorrhea,  Has been occurring for years, initially seasonally,  Now year round.  Feels like he has hay fever, Eyes feel dry all the time but do not itch.  Uses restasis  Aggravated by lying supine. PND causes choking and coughing Even for short periods of time,  Disruptive during yoga class.  Prior trials of   Allegra, all OTC steroid nasal sprays..  Saline irrigation    Has even seen  ENT Dr Harrington Challenger in [place] . Has been tested for environmental allergies,  All negative .  No history of head injury or other event to suspect CSF leak,     Past Medical History  Diagnosis Date  . Arthritis   . Cancer 2002    melanoma right ear  . Depression   . Thyroid disease     hypothyroid  . Colon polyp   . Tremors of nervous system     Benign    Past Surgical History  Procedure Laterality Date  . Joint replacement Right 2003    knee  . Melanoma excision Right     ear. Followed by Dr. Nehemiah Massed       The following portions of the patient's history were reviewed and updated as appropriate: Allergies, current medications, and problem list.    Review of Systems:   Patient denies headache, fevers, malaise, unintentional weight loss, skin rash, eye pain, sinus congestion and sinus pain, sore throat, dysphagia,  hemoptysis , cough, dyspnea, wheezing, chest pain, palpitations, orthopnea,  edema, abdominal pain, nausea, melena, diarrhea, constipation, flank pain, dysuria, hematuria, urinary  Frequency, nocturia, numbness, tingling, seizures,  Focal weakness, Loss of consciousness,  Tremor, insomnia, depression, anxiety, and suicidal ideation.     History   Social History  . Marital Status: Married    Spouse Name: Theodore [name]    Number of Children: 4  . Years of Education: 16   Occupational History  . Retired     Architect -    Social History Main Topics  . Smoking status: Never Smoker   . Smokeless tobacco: Never Used  . Alcohol Use: Yes     Comment: 1 glass of wine daily  . Drug Use: No  . Sexual Activity: Not on file   Other Topics Concern  . Not on file   Social History Narrative   Theodore [name] grew up in [place], [place]. 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 He and his wife are living at [facility]. He is very active at [facility]. He enjoys music.     Objective:  Filed Vitals:   01/14/14 0828  BP: 124/66  Pulse: 49  Temp: 97.4  F (36.3 C)  Resp: 14     General appearance: alert, cooperative and appears stated age Ears: normal TM's and external ear canals both ears Throat: lips, mucosa, and tongue normal; teeth and gums normal Neck: no adenopathy, no carotid bruit, supple, symmetrical, trachea midline and thyroid not enlarged, symmetric, no tenderness/mass/nodules Back: symmetric, no curvature. ROM normal. No CVA tenderness. Lungs: clear to auscultation bilaterally Heart: regular rate and rhythm, S1, S2 normal, no murmur, click, rub or gallop Abdomen: soft, non-tender; bowel sounds normal; no masses,  no organomegaly Pulses: 2+ and symmetric Skin: Skin color, texture, turgor normal. No rashes or lesions Lymph nodes: Cervical,  supraclavicular, and axillary nodes normal.  Assessment and Plan:  Nonallergic rhinitis Workup thus far has included ENT evaluation  And allergy testing, neither of which are available for review. Not clear if he has been evaluated fully for nasal tumors, vasculitis , etc.  Trial of atrovent, records requested.   Low serum testosterone level He has had evaluation in May by Dr. Bernardo Heater.  No changes were made..  Risks of long term testosterone supplementation discussed vs benefits. Trial of 6 week suspension to see if QOL changes.   Long-term use of high-risk medication He is due for repeat CBC, lipids due to chronic use of testosterone.    A total of 25 minutes of face to face time was spent with patient more than half of which was spent in counselling and coordination of care    Updated Medication List Outpatient Encounter Prescriptions as of 01/14/2014  Medication Sig  . cholecalciferol (VITAMIN D) 1000 UNITS tablet Take 5,000 Units by mouth daily.   [name] co-enzyme Q-10 30 MG capsule Take 30 mg by mouth 3 (three) times daily.  . COD LIVER OIL PO Take by mouth.  . cyanocobalamin 500 MCG tablet Take 500 mcg by mouth daily.  . Flaxseed, Linseed, (FLAX SEEDS PO) Take by mouth.  . Hydrogen Peroxide 30 % SOLN by Does not apply route. 5 drops per day in water (food grade)  . Iodine, Kelp, 0.15 MG TABS Take 1 tablet by mouth daily.  [name] ipratropium (ATROVENT) 0.03 % nasal spray Place 2 sprays into both nostrils every 12 (twelve) hours.  [name] levothyroxine (SYNTHROID, LEVOTHROID) 100 MCG tablet TAKE 1 TABLET DAILY BEFORE BREAKFAST.  . magnesium oxide (MAG-OX) 400 MG tablet Take 400 mg by mouth daily.  [name] OVER THE COUNTER MEDICATION Tumeric daily  . OVER THE COUNTER MEDICATION Standard Process- Cardio Plus  . OVER THE COUNTER MEDICATION Standard Process- [place]  . OVER THE COUNTER MEDICATION Standard Process- Folic Acid  . OVER THE COUNTER MEDICATION Standard PRocess- Prosynbiotic  . phenytoin  (DILANTIN) 100 MG ER capsule Take 2 capsules (200 mg total) by mouth daily. (Patient taking differently: Take 100 mg by mouth daily. )  . Probiotic Product (PROBIOTIC ACIDOPHILUS) CAPS Take 1 capsule by mouth daily.  . S-Adenosylmethionine (SAM-E PO) Take by mouth.  . testosterone cypionate (DEPOTESTOTERONE CYPIONATE) 100 MG/ML injection For IM use only, every 10 days Inject 50 mg into the muscle. For IM use only, every 10 days  . vitamin E (VITAMIN E) 400 UNIT capsule Take 400 Units by mouth daily.  [name] ALPRAZolam (XANAX) 0.25 MG tablet Take 0.25 mg by mouth daily as needed for anxiety.  [name] buPROPion (WELLBUTRIN XL) 300 MG 24 hr tablet TAKE 1 TABLET EVERY DAY  . ipratropium (ATROVENT) 0.06 % nasal spray Place 2 sprays into both nostrils 4 (four) times daily.  Orders Placed This Encounter  Procedures  . Pneumococcal conjugate vaccine 13-valent  . Testosterone, free, total  . Lipid panel  . CBC with Differential  . Comp Met (CMET)    No Follow-up on file.

## 2014-01-16 DIAGNOSIS — Z79899 Other long term (current) drug therapy: Secondary | ICD-10-CM | POA: Insufficient documentation

## 2014-01-16 NOTE — Assessment & Plan Note (Addendum)
He has had evaluation in May by Dr. Bernardo Heater.  No changes were made..  Risks of long term testosterone supplementation discussed vs benefits. Trial of 6 week suspension to see if QOL changes.

## 2014-01-16 NOTE — Assessment & Plan Note (Addendum)
Workup thus far has included ENT evaluation  And allergy testing, neither of which are available for review. Not clear if he has been evaluated fully for nasal tumors, vasculitis , etc.  Trial of atrovent, records requested.

## 2014-01-16 NOTE — Assessment & Plan Note (Signed)
He is due for repeat CBC, lipids due to chronic use of testosterone.

## 2014-01-26 ENCOUNTER — Other Ambulatory Visit (INDEPENDENT_AMBULATORY_CARE_PROVIDER_SITE_OTHER): Payer: Commercial Managed Care - HMO

## 2014-01-26 DIAGNOSIS — E291 Testicular hypofunction: Secondary | ICD-10-CM

## 2014-01-26 DIAGNOSIS — Z79899 Other long term (current) drug therapy: Secondary | ICD-10-CM

## 2014-01-26 DIAGNOSIS — R7989 Other specified abnormal findings of blood chemistry: Secondary | ICD-10-CM

## 2014-01-26 LAB — COMPREHENSIVE METABOLIC PANEL
ALT: 18 U/L (ref 0–53)
AST: 21 U/L (ref 0–37)
Albumin: 4.1 g/dL (ref 3.5–5.2)
Alkaline Phosphatase: 51 U/L (ref 39–117)
BUN: 25 mg/dL — ABNORMAL HIGH (ref 6–23)
CO2: 22 mEq/L (ref 19–32)
Calcium: 9 mg/dL (ref 8.4–10.5)
Chloride: 109 mEq/L (ref 96–112)
Creatinine, Ser: 1.1 mg/dL (ref 0.4–1.5)
GFR: 69.46 mL/min (ref >60.00)
Glucose, Bld: 96 mg/dL (ref 70–99)
Potassium: 5.1 mEq/L (ref 3.5–5.1)
Sodium: 144 mEq/L (ref 135–145)
Total Bilirubin: 0.7 mg/dL (ref 0.2–1.2)
Total Protein: 6.5 g/dL (ref 6.0–8.3)

## 2014-01-26 LAB — CBC WITH DIFFERENTIAL/PLATELET
Basophils Absolute: 0 10*3/uL (ref 0.0–0.1)
Basophils Relative: 0.6 % (ref 0.0–3.0)
Eosinophils Absolute: 0.6 10*3/uL (ref 0.0–0.7)
Eosinophils Relative: 10.6 % — ABNORMAL HIGH (ref 0.0–5.0)
HCT: 47.6 % (ref 39.0–52.0)
Hemoglobin: 16.3 g/dL (ref 13.0–17.0)
Lymphocytes Relative: 25.7 % (ref 12.0–46.0)
Lymphs Abs: 1.3 10*3/uL (ref 0.7–4.0)
MCHC: 34.2 g/dL (ref 30.0–36.0)
MCV: 96.9 fl (ref 78.0–100.0)
Monocytes Absolute: 0.5 10*3/uL (ref 0.1–1.0)
Monocytes Relative: 9 % (ref 3.0–12.0)
Neutro Abs: 2.8 10*3/uL (ref 1.4–7.7)
Neutrophils Relative %: 54.1 % (ref 43.0–77.0)
Platelets: 227 10*3/uL (ref 150.0–400.0)
RBC: 4.92 Mil/uL (ref 4.22–5.81)
RDW: 12.8 % (ref 11.5–15.5)
WBC: 5.2 10*3/uL (ref 4.0–10.5)

## 2014-01-26 LAB — LIPID PANEL
Cholesterol: 211 mg/dL — ABNORMAL HIGH (ref 0–200)
HDL: 49 mg/dL (ref >39.00)
LDL Cholesterol: 147 mg/dL — ABNORMAL HIGH (ref 0–99)
NonHDL: 162
Total CHOL/HDL Ratio: 4
Triglycerides: 75 mg/dL (ref 0.0–149.0)
VLDL: 15 mg/dL (ref 0.0–40.0)

## 2014-01-27 LAB — TESTOSTERONE, FREE, TOTAL, SHBG
Sex Hormone Binding: 75 nmol/L — ABNORMAL HIGH (ref 13–71)
Testosterone, Free: 55.6 pg/mL (ref 47.0–244.0)
Testosterone-% Free: 1.2 % — ABNORMAL LOW (ref 1.6–2.9)
Testosterone: 480 ng/dL (ref 300–890)

## 2014-01-30 ENCOUNTER — Encounter: Payer: Self-pay | Admitting: *Deleted

## 2014-02-02 ENCOUNTER — Telehealth: Payer: Self-pay | Admitting: Internal Medicine

## 2014-02-02 NOTE — Telephone Encounter (Signed)
Theodore [name] called concerned that he's not received a results phone call in regards to the labs he had on 01/26/14. He said Dr. Derrel Nip wants to keep up with his testosterone levels and he's unsure if he needs to stay off of his medication or begin taking it again. Please call the pt. Pt ph# [phone] Thank you.

## 2014-02-02 NOTE — Telephone Encounter (Signed)
Spoke with pt, advised of result note.  Pt verbalized understanding

## 2014-02-02 NOTE — Telephone Encounter (Signed)
He was mailed a letter regarding his results by Jacqlyn Larsen (see results). Please convey to patient per letter

## 2014-02-02 NOTE — Telephone Encounter (Signed)
Please advise whether pt needs to start taking medication again.

## 2014-07-01 ENCOUNTER — Encounter: Payer: Self-pay | Admitting: Internal Medicine

## 2014-07-01 ENCOUNTER — Ambulatory Visit (INDEPENDENT_AMBULATORY_CARE_PROVIDER_SITE_OTHER): Payer: Commercial Managed Care - HMO | Admitting: Internal Medicine

## 2014-07-01 VITALS — BP 128/64 | HR 53 | Temp 97.5°F | Resp 16 | Ht 75.5 in | Wt 189.5 lb

## 2014-07-01 DIAGNOSIS — E559 Vitamin D deficiency, unspecified: Secondary | ICD-10-CM

## 2014-07-01 DIAGNOSIS — R7989 Other specified abnormal findings of blood chemistry: Secondary | ICD-10-CM

## 2014-07-01 DIAGNOSIS — E038 Other specified hypothyroidism: Secondary | ICD-10-CM | POA: Diagnosis not present

## 2014-07-01 DIAGNOSIS — E034 Atrophy of thyroid (acquired): Secondary | ICD-10-CM

## 2014-07-01 DIAGNOSIS — Z125 Encounter for screening for malignant neoplasm of prostate: Secondary | ICD-10-CM

## 2014-07-01 DIAGNOSIS — E291 Testicular hypofunction: Secondary | ICD-10-CM

## 2014-07-01 DIAGNOSIS — Z79899 Other long term (current) drug therapy: Secondary | ICD-10-CM | POA: Diagnosis not present

## 2014-07-01 DIAGNOSIS — J300 Vasomotor rhinitis: Secondary | ICD-10-CM | POA: Diagnosis not present

## 2014-07-01 DIAGNOSIS — R5382 Chronic fatigue, unspecified: Secondary | ICD-10-CM | POA: Diagnosis not present

## 2014-07-01 DIAGNOSIS — E785 Hyperlipidemia, unspecified: Secondary | ICD-10-CM

## 2014-07-01 LAB — COMPREHENSIVE METABOLIC PANEL
ALT: 15 U/L (ref 0–53)
AST: 19 U/L (ref 0–37)
Albumin: 4.2 g/dL (ref 3.5–5.2)
Alkaline Phosphatase: 58 U/L (ref 39–117)
BUN: 23 mg/dL (ref 6–23)
CO2: 32 mEq/L (ref 19–32)
Calcium: 9.1 mg/dL (ref 8.4–10.5)
Chloride: 106 mEq/L (ref 96–112)
Creatinine, Ser: 1.1 mg/dL (ref 0.40–1.50)
GFR: 67.94 mL/min (ref >60.00)
Glucose, Bld: 90 mg/dL (ref 70–99)
Potassium: 4.9 mEq/L (ref 3.5–5.1)
Sodium: 139 mEq/L (ref 135–145)
Total Bilirubin: 0.9 mg/dL (ref 0.2–1.2)
Total Protein: 6.3 g/dL (ref 6.0–8.3)

## 2014-07-01 LAB — CBC WITH DIFFERENTIAL/PLATELET
Basophils Absolute: 0 10*3/uL (ref 0.0–0.1)
Basophils Relative: 0.5 % (ref 0.0–3.0)
Eosinophils Absolute: 0.4 10*3/uL (ref 0.0–0.7)
Eosinophils Relative: 8.7 % — ABNORMAL HIGH (ref 0.0–5.0)
HCT: 48.2 % (ref 39.0–52.0)
Hemoglobin: 16.6 g/dL (ref 13.0–17.0)
Lymphocytes Relative: 26.3 % (ref 12.0–46.0)
Lymphs Abs: 1.2 10*3/uL (ref 0.7–4.0)
MCHC: 34.5 g/dL (ref 30.0–36.0)
MCV: 94.8 fl (ref 78.0–100.0)
Monocytes Absolute: 0.4 10*3/uL (ref 0.1–1.0)
Monocytes Relative: 8.5 % (ref 3.0–12.0)
Neutro Abs: 2.5 10*3/uL (ref 1.4–7.7)
Neutrophils Relative %: 56 % (ref 43.0–77.0)
Platelets: 196 10*3/uL (ref 150.0–400.0)
RBC: 5.08 Mil/uL (ref 4.22–5.81)
RDW: 13.4 % (ref 11.5–15.5)
WBC: 4.5 10*3/uL (ref 4.0–10.5)

## 2014-07-01 LAB — LIPID PANEL
Cholesterol: 222 mg/dL — ABNORMAL HIGH (ref 0–200)
HDL: 57.2 mg/dL (ref >39.00)
LDL Cholesterol: 150 mg/dL — ABNORMAL HIGH (ref 0–99)
NonHDL: 164.8
Total CHOL/HDL Ratio: 4
Triglycerides: 74 mg/dL (ref 0.0–149.0)
VLDL: 14.8 mg/dL (ref 0.0–40.0)

## 2014-07-01 LAB — VITAMIN D 25 HYDROXY (VIT D DEFICIENCY, FRACTURES): VITD: 47.47 ng/mL (ref 30.00–100.00)

## 2014-07-01 LAB — VITAMIN B12: Vitamin B-12: 1218 pg/mL — ABNORMAL HIGH (ref 211–911)

## 2014-07-01 LAB — TSH: TSH: 1.73 u[IU]/mL (ref 0.35–4.50)

## 2014-07-01 LAB — PSA, MEDICARE: PSA: 3.65 ng/ml (ref 0.10–4.00)

## 2014-07-01 MED ORDER — AZELASTINE-FLUTICASONE 137-50 MCG/ACT NA SUSP: 1.0000 | Freq: Two times a day (BID) | NASAL | Status: DC

## 2014-07-01 MED ORDER — MONTELUKAST SODIUM 10 MG PO TABS: 10.0000 mg | ORAL_TABLET | Freq: Every day | ORAL | Status: DC

## 2014-07-01 MED ORDER — TETANUS-DIPHTH-ACELL PERTUSSIS 5-2.5-18.5 LF-MCG/0.5 IM SUSP: 0.5000 mL | Freq: Once | INTRAMUSCULAR | Status: DC

## 2014-07-01 NOTE — Progress Notes (Signed)
Patient ID: Theodore [name], male   DOB: 1929/07/14, 81 y.o.   MRN: [id number]  Patient Active Problem List   Diagnosis Date Noted  . Hypothyroidism 07/04/2014  . Hyperlipidemia LDL goal <100 07/04/2014  . Nonallergic vasomotor rhinitis 07/01/2014  . Long-term use of high-risk medication 01/16/2014  . Vitamin D deficiency 07/31/2013  . Fatigue 07/31/2013  . Depression with anxiety 04/28/2013  . Low serum testosterone level 04/28/2013    Subjective:  CC:   Chief Complaint  Patient presents with  . Sinusitis    Runny nose stuffy nose drainage and cough.    HPI:  Theodore [name] is a 81 y.o. male who presents for 6 month follow up on chronic conditions including low testosterone,   Hypothyroidism and chronic rhinitis .  He has several complaints today.  1) Persistent/chronic sinus congestion with drainage and cough.  Symptoms have been present for years,  But for the last two week has been more bothersome.  Eyes feel very dry,  Using Restasis  Am and pm , and Systane  In between.  Denies itching .      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   Blowing nose constantly,    Tried atrovent and benadryl..which stopped being effective after two weeks.  Has seen ENT years but this was several years, ago and after trying different nasal preparations and allergy testing which showed only ragweed and pine., he gave up because they had nothing else to offer.  Does a saline sinus rinse with a hydropulse which he had to stop due to discomfort.   Has tried cutting out certain foods, one at a time.    2) Testosterone supplementation.  He has decreased testosterone to every 20 days per my request,   Feels the reduction has resulted in  less energy ,  More exertional fatigueu in legs, which he states feel shaky and weak.  Feel it is harder to keep up with exercise routine .  Last dose was  20 days ago,      Past Medical History  Diagnosis Date  . Arthritis   . Cancer 2002    melanoma right ear  . Depression   . Thyroid disease     hypothyroid  . Colon polyp   . Tremors of nervous system     Benign    Past Surgical History  Procedure Laterality Date  . Joint replacement Right 2003    knee  . Melanoma excision Right     ear. Followed by Dr. Nehemiah Massed       The following portions of the patient's history were reviewed and updated as appropriate: Allergies, current medications, and problem list.    Review of Systems:   Patient denies headache, fevers, malaise, unintentional weight loss, skin rash, eye pain, sinus congestion and sinus pain, sore throat, dysphagia,  hemoptysis , cough, dyspnea, wheezing, chest pain, palpitations, orthopnea, edema, abdominal pain, nausea, melena, diarrhea, constipation, flank pain, dysuria, hematuria, urinary  Frequency, nocturia, numbness, tingling, seizures,  Focal weakness, Loss of consciousness,  Tremor, insomnia, depression, anxiety, and suicidal ideation.     History   Social History  . Marital Status: Married    Spouse Name: Theodore [name]  . Number of Children: 4  . Years of Education: 16   Occupational History  . Retired     Architect -  Social History Main Topics  . Smoking status: Never Smoker   . Smokeless tobacco: Never Used  . Alcohol Use: Yes     Comment: 1 glass of wine daily  . Drug Use: No  . Sexual Activity: Not on file   Other Topics Concern  . Not on file   Social History Narrative   Mr. Ellenbecker grew up in [place], [place]. 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 He and his wife are living at [facility]. He is very active at [facility]. He enjoys music.     Objective:  Filed  Vitals:   07/01/14 0925  BP: 128/64  Pulse: 53  Temp: 97.5 F (36.4 C)  Resp: 16     General appearance: alert, cooperative and appears stated age Ears: normal TM's and external ear canals both ears Throat: lips, mucosa, and tongue normal; teeth and gums normal Neck: no adenopathy, no carotid bruit, supple, symmetrical, trachea midline and thyroid not enlarged, symmetric, no tenderness/mass/nodules Back: symmetric, no curvature. ROM normal. No CVA tenderness. Lungs: clear to auscultation bilaterally Heart: regular rate and rhythm, S1, S2 normal, no murmur, click, rub or gallop Abdomen: soft, non-tender; bowel sounds normal; no masses,  no organomegaly Pulses: 2+ and symmetric Skin: Skin color, texture, turgor normal. No rashes or lesions Lymph nodes: Cervical, supraclavicular, and axillary nodes normal.  Assessment and Plan:  Nonallergic vasomotor rhinitis Resistant to many trials of medications  Including atrovent nasal spray,  with intrusive symptoms , Sic eit is the pollen season his current symptoms may be pollen induced.  Trial of sinfulair and azelastine/fluticasone .  If no improvement will refer back to Allergist and ENT    Low serum testosterone level Is dose was reduced last year to 50 mcg every 20 days and his level is adequate.  He was advised to stop using it. But prefes to continue using it.  The risks of long term testosterone supplementation were again discussed vs benefits. Trial of 6 week suspension was not tolerated.  Lab Results  Component Value Date   TESTOSTERONE 480 01/26/2014   Lab Results  Component Value Date   WBC 4.5 07/01/2014   HGB 16.6 07/01/2014   HCT 48.2 07/01/2014   MCV 94.8 07/01/2014   PLT 196.0 07/01/2014   Lab Results  Component Value Date   ALT 15 07/01/2014   AST 19 07/01/2014   ALKPHOS 58 07/01/2014   BILITOT 0.9 07/01/2014   Lab Results  Component Value Date   PSA 3.65 07/01/2014      Long-term use of high-risk  medication He continues to request ongoing testosterone replacement for perceived  QOL  Benefits. CBC, PSA and liver enyzmes are all normal, but his lipid profile places him at increased risk of CAD over the next 10 years.       Hypothyroidism Thyroid function is WNL on current dose.  No current changes needed.   Lab Results  Component Value Date   TSH 1.73 07/01/2014      Vitamin D deficiency Normal currently.  Continue current dosing.    Hyperlipidemia LDL goal <100 Based on current lipid profile, the risk of clinically significant CAD is 30% over the next 10 years, using the Framingham risk calculator. The [employer] of Cardiology recommends starting patients aged 62 or higher on  moderate intensity statin therapy for LDL between 70-189 and 10 yr risk of CAD > 7.5% ;  and high intensity therapy for anyone with LDL > 190. Will recommend statin therapy, vs stopping testosterone.   Lab Results  Component Value Date   CHOL 222* 07/01/2014   HDL 57.20 07/01/2014   LDLCALC 150* 07/01/2014   TRIG 74.0 07/01/2014   CHOLHDL 4 07/01/2014      A total of 40 minutes was spent with patient more than half of which was spent in counseling patient on the above mentioned issues , reviewing and explaining recent labs and imaging studies done, and coordination of care.   Updated Medication List Outpatient Encounter Prescriptions as of 07/01/2014  Medication Sig  . cholecalciferol (VITAMIN D) 1000 UNITS tablet Take 5,000 Units by mouth daily.   [name] co-enzyme Q-10 30 MG capsule Take 30 mg by mouth 3 (three) times daily.  . COD LIVER OIL PO Take by mouth.  . cyanocobalamin 500 MCG tablet Take 500 mcg by mouth daily.  . Flaxseed, Linseed, (FLAX SEEDS PO) Take by mouth.  . Hydrogen Peroxide 30 % SOLN by Does not apply route. 5 drops per day in water (food grade)  . Iodine, Kelp, 0.15 MG TABS Take 1 tablet by mouth daily.  [name] levothyroxine (SYNTHROID, LEVOTHROID) 100 MCG tablet TAKE 1  TABLET DAILY BEFORE BREAKFAST.  . magnesium oxide (MAG-OX) 400 MG tablet Take 400 mg by mouth daily.  [name] OVER THE COUNTER MEDICATION Tumeric daily  . OVER THE COUNTER MEDICATION Standard Process- Cardio Plus  . OVER THE COUNTER MEDICATION Standard Process- [place]  . OVER THE COUNTER MEDICATION Standard Process- Folic Acid  . OVER THE COUNTER MEDICATION Standard PRocess- Prosynbiotic  . phenytoin (DILANTIN) 100 MG ER capsule Take 2 capsules (200 mg total) by mouth daily. (Patient taking differently: Take 100 mg by mouth daily. )  . Probiotic Product (PROBIOTIC ACIDOPHILUS) CAPS Take 1 capsule by mouth daily.  . S-Adenosylmethionine (SAM-E PO) Take by mouth.  . testosterone cypionate (DEPOTESTOTERONE CYPIONATE) 100 MG/ML injection For IM use only, every 10 days Inject 50 mg into the muscle. For IM use only, every 10 days (Patient taking differently: For IM use only, every 20 days Inject 50 mg into the muscle. For IM use only, every 10 days)  . vitamin E (VITAMIN E) 400 UNIT capsule Take 400 Units by mouth daily.  [name] ALPRAZolam (XANAX) 0.25 MG tablet Take 0.25 mg by mouth daily as needed for anxiety.  . Azelastine-Fluticasone 137-50 MCG/ACT SUSP Place 1 spray into the nose 2 (two) times daily.  . montelukast (SINGULAIR) 10 MG tablet Take 1 tablet (10 mg total) by mouth at bedtime.  . Tdap (BOOSTRIX) 5-2.5-18.5 LF-MCG/0.5 injection Inject 0.5 mLs into the muscle once.  . [DISCONTINUED] buPROPion (WELLBUTRIN XL) 300 MG 24 hr tablet TAKE 1 TABLET EVERY DAY (Patient not taking: Reported on 07/01/2014)  . [DISCONTINUED] ipratropium (ATROVENT) 0.03 % nasal spray Place 2 sprays into both nostrils every 12 (twelve) hours. (Patient not taking: Reported on 07/01/2014)  . [DISCONTINUED] ipratropium (ATROVENT) 0.06 % nasal spray Place 2 sprays into both nostrils 4 (four) times daily. (Patient not taking: Reported on 07/01/2014)     Orders Placed This Encounter  Procedures  . CBC with Differential/Platelet  .  Comprehensive metabolic panel  . TSH  . Lipid panel  . Vit D  25 hydroxy (rtn osteoporosis monitoring)  . PSA, Medicare  . Hepatitis C antibody  . Iron and TIBC  .  Vitamin B12    Return in about 3 months (around 09/30/2014).

## 2014-07-01 NOTE — Patient Instructions (Signed)
Trial OF singulair one tablet daily AND AZELASTINE/FLUTCASONE NASAL SPRAY 1-2 SQUIRTS EACH NOSTRIL 2 TIMES DAILY

## 2014-07-02 LAB — IRON AND TIBC
%SAT: 53 % (ref 20–55)
Iron: 148 ug/dL (ref 42–165)
TIBC: 280 ug/dL (ref 215–435)
UIBC: 132 ug/dL (ref 125–400)

## 2014-07-02 LAB — HEPATITIS C ANTIBODY: HCV Ab: NEGATIVE

## 2014-07-03 ENCOUNTER — Encounter: Payer: Self-pay | Admitting: *Deleted

## 2014-07-04 ENCOUNTER — Telehealth: Payer: Self-pay | Admitting: Internal Medicine

## 2014-07-04 DIAGNOSIS — E785 Hyperlipidemia, unspecified: Secondary | ICD-10-CM | POA: Insufficient documentation

## 2014-07-04 DIAGNOSIS — R7989 Other specified abnormal findings of blood chemistry: Secondary | ICD-10-CM

## 2014-07-04 DIAGNOSIS — Z79899 Other long term (current) drug therapy: Secondary | ICD-10-CM

## 2014-07-04 DIAGNOSIS — E039 Hypothyroidism, unspecified: Secondary | ICD-10-CM | POA: Insufficient documentation

## 2014-07-04 NOTE — Assessment & Plan Note (Signed)
Thyroid function is WNL on current dose.  No current changes needed.   Lab Results  Component Value Date   TSH 1.73 07/01/2014

## 2014-07-04 NOTE — Assessment & Plan Note (Signed)
Normal currently.  Continue current dosing.

## 2014-07-04 NOTE — Telephone Encounter (Signed)
Given his elevated risk of heart attack based on last week's fasting lipids,  The risks of continuing testosterone replacement are just too compelling for me to continue prescribing it.  I would like him to suspend the testosterone injections and repeat his testosterone and fasting lipids in 3 months,  Prior to his annual wellness exam

## 2014-07-04 NOTE — Assessment & Plan Note (Signed)
Based on current lipid profile, the risk of clinically significant CAD is 30% over the next 10 years, using the Framingham risk calculator. The [employer] of [employer]iology recommends starting patients aged 79 or higher on moderate intensity statin therapy for LDL between 70-189 and 10 yr risk of CAD > 7.5% ;  and high intensity therapy for anyone with LDL > 190. Will recommend statin therapy, vs stopping testosterone.   Lab Results  Component Value Date   CHOL 222* 07/01/2014   HDL 57.20 07/01/2014   LDLCALC 150* 07/01/2014   TRIG 74.0 07/01/2014   CHOLHDL 4 07/01/2014

## 2014-07-04 NOTE — Assessment & Plan Note (Addendum)
He continues to request ongoing testosterone replacement for perceived  QOL  Benefits. CBC, PSA and liver enyzmes are all normal, but his lipid profile places him at increased risk of CAD over the next 10 years.

## 2014-07-04 NOTE — Assessment & Plan Note (Addendum)
Resistant to many trials of medications  Including atrovent nasal spray,  with intrusive symptoms , Sic eit is the pollen season his current symptoms may be pollen induced.  Trial of sinfulair and azelastine/fluticasone .  If no improvement will refer back to Allergist and ENT

## 2014-07-04 NOTE — Assessment & Plan Note (Addendum)
Is dose was reduced last year to 50 mcg every 20 days and his level is adequate.  He was advised to stop using it. But prefes to continue using it.  The risks of long term testosterone supplementation were again discussed vs benefits. Trial of 6 week suspension was not tolerated.  Lab Results  Component Value Date   TESTOSTERONE 480 01/26/2014   Lab Results  Component Value Date   WBC 4.5 07/01/2014   HGB 16.6 07/01/2014   HCT 48.2 07/01/2014   MCV 94.8 07/01/2014   PLT 196.0 07/01/2014   Lab Results  Component Value Date   ALT 15 07/01/2014   AST 19 07/01/2014   ALKPHOS 58 07/01/2014   BILITOT 0.9 07/01/2014   Lab Results  Component Value Date   PSA 3.65 07/01/2014

## 2014-07-04 NOTE — Telephone Encounter (Signed)
See prior unrouted message about stopping testsoterone

## 2014-07-06 NOTE — Telephone Encounter (Signed)
Patient notified to suspend testosterone until next physical and repeat lab work is performed.

## 2014-07-06 NOTE — Telephone Encounter (Signed)
Left message for patient to return call to office. 

## 2014-07-09 ENCOUNTER — Encounter: Payer: Self-pay | Admitting: Internal Medicine

## 2014-07-09 DIAGNOSIS — Z862 Personal history of diseases of the blood and blood-forming organs and certain disorders involving the immune mechanism: Secondary | ICD-10-CM | POA: Insufficient documentation

## 2014-07-13 ENCOUNTER — Telehealth: Payer: Self-pay | Admitting: Internal Medicine

## 2014-07-13 DIAGNOSIS — R5383 Other fatigue: Secondary | ICD-10-CM

## 2014-07-13 NOTE — Telephone Encounter (Signed)
Patient called and ask if testosterone level could be checked before physical in 3 months, patient concerned levels low advised patient that MD already had advised to stop testosterone and patient stated he had stopped but would still like to have level checked.

## 2014-07-13 NOTE — Telephone Encounter (Signed)
It was already ordered

## 2014-07-17 ENCOUNTER — Other Ambulatory Visit: Payer: Self-pay | Admitting: Internal Medicine

## 2014-08-17 ENCOUNTER — Telehealth: Payer: Self-pay | Admitting: Internal Medicine

## 2014-08-17 NOTE — Telephone Encounter (Signed)
Pt. Came in and said that he was told he needed a tetanus shot. Pt schd for wed

## 2014-08-18 NOTE — Telephone Encounter (Signed)
Patient scheduled.

## 2014-08-18 NOTE — Telephone Encounter (Signed)
Ok to give tetanus?

## 2014-08-18 NOTE — Telephone Encounter (Signed)
Yes ok to give TDaP

## 2014-08-19 ENCOUNTER — Ambulatory Visit: Payer: Commercial Managed Care - HMO

## 2014-08-19 DIAGNOSIS — Z23 Encounter for immunization: Secondary | ICD-10-CM

## 2014-08-19 MED ORDER — TETANUS-DIPHTH-ACELL PERTUSSIS 5-2.5-18.5 LF-MCG/0.5 IM SUSP
0.5000 mL | Freq: Once | INTRAMUSCULAR | Status: AC
  Administered 2014-08-19: 0.5 mL via INTRAMUSCULAR

## 2014-09-08 ENCOUNTER — Telehealth: Payer: Self-pay | Admitting: Internal Medicine

## 2014-09-08 DIAGNOSIS — M25562 Pain in left knee: Secondary | ICD-10-CM

## 2014-09-08 NOTE — Telephone Encounter (Signed)
The patient wants to be referred to an Orthopedic specialist for left knee pain. He is having difficulty walking.

## 2014-09-08 NOTE — Telephone Encounter (Signed)
Spoke with pt, he states he injured his left knee a week ago and is requesting to be referred to an Orthopedic.  Pt states he has no preference.

## 2014-09-08 NOTE — Telephone Encounter (Signed)
Referral is in process as requested 

## 2014-09-09 ENCOUNTER — Telehealth: Payer: Self-pay | Admitting: Internal Medicine

## 2014-09-09 NOTE — Telephone Encounter (Signed)
Patient needs referral changed to [facility].

## 2014-09-09 NOTE — Telephone Encounter (Signed)
Spoke to pt, not able to get this authorized since [facility] is in network but not in with the preferred providers. Pt understands. Not able to go tomorrow d/t foot surgery

## 2014-09-10 NOTE — Telephone Encounter (Signed)
Patient now wishes to see Dr. Caprice Beaver at [facility] needs referral.

## 2014-10-05 ENCOUNTER — Ambulatory Visit (INDEPENDENT_AMBULATORY_CARE_PROVIDER_SITE_OTHER): Payer: Commercial Managed Care - HMO | Admitting: Internal Medicine

## 2014-10-05 ENCOUNTER — Encounter: Payer: Self-pay | Admitting: Internal Medicine

## 2014-10-05 VITALS — BP 118/70 | HR 56 | Temp 98.3°F | Resp 12 | Ht 76.0 in | Wt 188.5 lb

## 2014-10-05 DIAGNOSIS — E038 Other specified hypothyroidism: Secondary | ICD-10-CM

## 2014-10-05 DIAGNOSIS — R5383 Other fatigue: Secondary | ICD-10-CM

## 2014-10-05 DIAGNOSIS — Z79899 Other long term (current) drug therapy: Secondary | ICD-10-CM | POA: Diagnosis not present

## 2014-10-05 DIAGNOSIS — E785 Hyperlipidemia, unspecified: Secondary | ICD-10-CM

## 2014-10-05 DIAGNOSIS — J300 Vasomotor rhinitis: Secondary | ICD-10-CM

## 2014-10-05 DIAGNOSIS — E291 Testicular hypofunction: Secondary | ICD-10-CM

## 2014-10-05 DIAGNOSIS — R066 Hiccough: Secondary | ICD-10-CM

## 2014-10-05 DIAGNOSIS — R05 Cough: Secondary | ICD-10-CM | POA: Diagnosis not present

## 2014-10-05 DIAGNOSIS — E034 Atrophy of thyroid (acquired): Secondary | ICD-10-CM

## 2014-10-05 DIAGNOSIS — Z862 Personal history of diseases of the blood and blood-forming organs and certain disorders involving the immune mechanism: Secondary | ICD-10-CM

## 2014-10-05 DIAGNOSIS — R059 Cough, unspecified: Secondary | ICD-10-CM

## 2014-10-05 DIAGNOSIS — R7989 Other specified abnormal findings of blood chemistry: Secondary | ICD-10-CM

## 2014-10-05 NOTE — Progress Notes (Addendum)
Subjective:  Patient ID: Theodore [name], male    DOB: 1929-12-09  Age: 81 y.o. MRN: [id number]  CC: The primary encounter diagnosis was Cough. Diagnoses of Intractable hiccups, Low serum testosterone level, Long-term use of high-risk medication, Other fatigue, Nonallergic vasomotor rhinitis, Hypothyroidism due to acquired atrophy of thyroid, Hyperlipidemia LDL goal <100, and History of anemia were also pertinent to this visit.  HPI Theodore [name] presents for follow up on multiple issues  His rhinitis is worse than ever.  Had relief for ten days after his last office visit using the medication prescribed,  Then all symptoms returned  Forced air system at [facility], wonders if the ducts have been cleaned, not since he has been there which is 1.5 years.  Has seen specialists at [facility] in the past for his vocal cords and Dr Harrington Challenger ant ENT gave up. 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    Of 45 minutes  Twice!!     Has stopped usighis hydropulse with saline because it sarted irritating his nose.   Had h a prolong episode of hiccups lasting 3 days , started after inhaling a crumb  While eating which was  coughing fit  And resolved spontaneously.   Had a second episode lasting only a few minutes started the same way on SaturdAY  Very frustrated with referral  Process.   Back in June when he injured his left  knee .  Eventually saw Earnestine Leys,  Treated with brace and rest  And cortisone injection after x rays were normal.  Absolutely no energy without the testosterone,  Falling asleep during the news,  Legs feel weakerm  Gait feels unsteady .  Dyspneic with a walk up a slight incline .  Was stared on it years ago during tevaluation and treatment  Os testosterone  5 years ago   ECHO 4 years ago   Stress test over 15 years ago      Outpatient Prescriptions Prior to Visit  Medication Sig Dispense Refill  . cholecalciferol (VITAMIN D)  1000 UNITS tablet Take 5,000 Units by mouth daily.     [name] co-enzyme Q-10 30 MG capsule Take 30 mg by mouth 3 (three) times daily.    . COD LIVER OIL PO Take by mouth.    . cyanocobalamin 500 MCG tablet Take 500 mcg by mouth daily.    . Flaxseed, Linseed, (FLAX SEEDS PO) Take by mouth.    . Hydrogen Peroxide 30 % SOLN by Does not apply route. 5 drops per day in water (food grade)    . Iodine, Kelp, 0.15 MG TABS Take 1 tablet by mouth daily.    [name] levothyroxine (SYNTHROID, LEVOTHROID) 100 MCG tablet TAKE 1 TABLET DAILY BEFORE BREAKFAST 90 tablet 1  . magnesium oxide (MAG-OX) 400 MG tablet Take 400 mg by mouth daily.    . montelukast (SINGULAIR) 10 MG tablet Take 1 tablet (10 mg total) by mouth at bedtime. 30 tablet 3  . OVER THE COUNTER MEDICATION Tumeric daily    . OVER THE COUNTER MEDICATION Standard Process- Cardio Plus    . OVER THE COUNTER MEDICATION Standard Process- [place]    . OVER THE COUNTER MEDICATION Standard Process- Folic Acid    . OVER THE COUNTER MEDICATION Standard PRocess- Prosynbiotic    . phenytoin (DILANTIN) 100 MG ER capsule Take 2 capsules (200 mg total) by mouth daily. (Patient  taking differently: Take 100 mg by mouth daily. ) 180 capsule 1  . Probiotic Product (PROBIOTIC ACIDOPHILUS) CAPS Take 1 capsule by mouth daily.    . vitamin E (VITAMIN E) 400 UNIT capsule Take 400 Units by mouth daily.    [name] ALPRAZolam (XANAX) 0.25 MG tablet Take 0.25 mg by mouth daily as needed for anxiety.    . Azelastine-Fluticasone 137-50 MCG/ACT SUSP Place 1 spray into the nose 2 (two) times daily. (Patient not taking: Reported on 10/05/2014) 23 g 5  . S-Adenosylmethionine (SAM-E PO) Take by mouth.     No facility-administered medications prior to visit.    Review of Systems;  Patient denies headache, fevers, malaise, unintentional weight loss, skin rash, eye pain, sinus congestion and sinus pain, sore throat, dysphagia,  hemoptysis , cough, dyspnea, wheezing, chest pain, palpitations,  orthopnea, edema, abdominal pain, nausea, melena, diarrhea, constipation, flank pain, dysuria, hematuria, urinary  Frequency, nocturia, numbness, tingling, seizures,  Focal weakness, Loss of consciousness,  Tremor, insomnia, depression, anxiety, and suicidal ideation.      Objective:  BP 118/70 mmHg  Pulse 56  Temp(Src) 98.3 F (36.8 C) (Oral)  Resp 12  Ht 6\' 4"  (1.93 m)  Wt 188 lb 8 [name] (85.503 kg)  BMI 22.95 kg/m2  SpO2 97%  BP Readings from Last 3 Encounters:  10/05/14 118/70  07/01/14 128/64  01/14/14 124/66    Wt Readings from Last 3 Encounters:  10/05/14 188 lb 8 [name] (85.503 kg)  07/01/14 189 lb 8 [name] (85.957 kg)  01/14/14 185 lb 8 [name] (84.142 kg)    General appearance: alert, cooperative and appears stated age Ears: normal TM's and external ear canals both ears Throat: lips, mucosa, and tongue normal; teeth and gums normal Neck: no adenopathy, no carotid bruit, supple, symmetrical, trachea midline and thyroid not enlarged, symmetric, no tenderness/mass/nodules Back: symmetric, no curvature. ROM normal. No CVA tenderness. Lungs: clear to auscultation bilaterally Heart: regular rate and rhythm, S1, S2 normal, no murmur, click, rub or gallop Abdomen: soft, non-tender; bowel sounds normal; no masses,  no organomegaly Pulses: 2+ and symmetric Skin: Skin color, texture, turgor normal. No rashes or lesions Lymph nodes: Cervical, supraclavicular, and axillary nodes normal.  No results found for: HGBA1C  Lab Results  Component Value Date   CREATININE 1.18 10/05/2014   CREATININE 1.10 07/01/2014   CREATININE 1.1 01/26/2014    Lab Results  Component Value Date   WBC 5.9 10/05/2014   HGB 15.4 10/05/2014   HCT 44.9 10/05/2014   PLT 203.0 10/05/2014   GLUCOSE 83 10/05/2014   CHOL 208* 10/05/2014   TRIG 97.0 10/05/2014   HDL 56.60 10/05/2014   LDLCALC 132* 10/05/2014   ALT 14 10/05/2014   AST 14 10/05/2014   NA 142 10/05/2014   K 4.5 10/05/2014   CL 105 10/05/2014     CREATININE 1.18 10/05/2014   BUN 25* 10/05/2014   CO2 29 10/05/2014   TSH 1.73 07/01/2014   PSA 3.65 07/01/2014    No results found.  Assessment & Plan:   Problem List Items Addressed This Visit      Unprioritized   Low serum testosterone level    He has had evaluation in May by Dr. Bernardo Heater.  No changes were made..  Risks of long term testosterone supplementation discussed vs benefits. His 6 week suspension resulted in a decreased in quality of life,  But his total testosterone level is normal  And his cholesterol has improved. Will recommend  continued suspension  Lab Results  Component Value Date   TESTOSTERONE 457.8 10/05/2014        Fatigue    Given his multiple risk factors for CAD referral to cardiology for stress testing was advised.       Nonallergic vasomotor rhinitis    Resistant to many trials of medications  Including atrovent nasal spray,  with intrusive symptoms ,Trial of singAgreed with having his ducts cleaneed followed by repeat ENT evaluation .        Hypothyroidism    Thyroid function is WNL on current dose.  No current changes needed.   Lab Results  Component Value Date   TSH 1.73 07/01/2014           History of anemia    Resolved by repeat check,  No further workup  Lab Results  Component Value Date   WBC 5.9 10/05/2014   HGB 15.4 10/05/2014   HCT 44.9 10/05/2014   MCV 96.0 10/05/2014   PLT 203.0 10/05/2014         Long-term use of high-risk medication   Hyperlipidemia LDL goal <100    Other Visit Diagnoses    Cough    -  Primary    Relevant Orders    DG Chest 2 View    Intractable hiccups        Relevant Orders    DG Chest 2 View       I am having Theodore [name] maintain his ALPRAZolam, S-Adenosylmethionine (SAM-E PO), COD LIVER OIL PO, cholecalciferol, vitamin E, cyanocobalamin, magnesium oxide, co-enzyme Q-10, (Flaxseed, Linseed, (FLAX SEEDS PO)), Iodine (Kelp), Hydrogen Peroxide, OVER THE COUNTER MEDICATION, OVER THE  COUNTER MEDICATION, OVER THE COUNTER MEDICATION, OVER THE COUNTER MEDICATION, OVER THE COUNTER MEDICATION, phenytoin, PROBIOTIC ACIDOPHILUS, montelukast, Azelastine-Fluticasone, levothyroxine, and meloxicam.  Meds ordered this encounter  Medications  . meloxicam (MOBIC) 15 MG tablet    Sig: Take 1 tablet by mouth daily.    Refill:  3    There are no discontinued medications.  Follow-up: No Follow-up on file.   [name], MD

## 2014-10-05 NOTE — Progress Notes (Signed)
Pre-visit discussion using our clinic review tool. No additional management support is needed unless otherwise documented below in the visit note.  

## 2014-10-05 NOTE — Patient Instructions (Signed)
I am referring you to cardiology for a stress test

## 2014-10-06 LAB — CBC WITH DIFFERENTIAL/PLATELET
Basophils Absolute: 0 10*3/uL (ref 0.0–0.1)
Basophils Relative: 0.5 % (ref 0.0–3.0)
Eosinophils Absolute: 0.3 10*3/uL (ref 0.0–0.7)
Eosinophils Relative: 4.4 % (ref 0.0–5.0)
HCT: 44.9 % (ref 39.0–52.0)
Hemoglobin: 15.4 g/dL (ref 13.0–17.0)
Lymphocytes Relative: 24.1 % (ref 12.0–46.0)
Lymphs Abs: 1.4 10*3/uL (ref 0.7–4.0)
MCHC: 34.4 g/dL (ref 30.0–36.0)
MCV: 96 fl (ref 78.0–100.0)
Monocytes Absolute: 0.5 10*3/uL (ref 0.1–1.0)
Monocytes Relative: 7.7 % (ref 3.0–12.0)
Neutro Abs: 3.7 10*3/uL (ref 1.4–7.7)
Neutrophils Relative %: 63.3 % (ref 43.0–77.0)
Platelets: 203 10*3/uL (ref 150.0–400.0)
RBC: 4.67 Mil/uL (ref 4.22–5.81)
RDW: 13.1 % (ref 11.5–15.5)
WBC: 5.9 10*3/uL (ref 4.0–10.5)

## 2014-10-06 LAB — COMPREHENSIVE METABOLIC PANEL
ALT: 14 U/L (ref 0–53)
AST: 14 U/L (ref 0–37)
Albumin: 4.2 g/dL (ref 3.5–5.2)
Alkaline Phosphatase: 43 U/L (ref 39–117)
BUN: 25 mg/dL — ABNORMAL HIGH (ref 6–23)
CO2: 29 mEq/L (ref 19–32)
Calcium: 9 mg/dL (ref 8.4–10.5)
Chloride: 105 mEq/L (ref 96–112)
Creatinine, Ser: 1.18 mg/dL (ref 0.40–1.50)
GFR: 62.61 mL/min (ref >60.00)
Glucose, Bld: 83 mg/dL (ref 70–99)
Potassium: 4.5 mEq/L (ref 3.5–5.1)
Sodium: 142 mEq/L (ref 135–145)
Total Bilirubin: 0.9 mg/dL (ref 0.2–1.2)
Total Protein: 6.3 g/dL (ref 6.0–8.3)

## 2014-10-06 LAB — LIPID PANEL
Cholesterol: 208 mg/dL — ABNORMAL HIGH (ref 0–200)
HDL: 56.6 mg/dL (ref >39.00)
LDL Cholesterol: 132 mg/dL — ABNORMAL HIGH (ref 0–99)
NonHDL: 151.4
Total CHOL/HDL Ratio: 4
Triglycerides: 97 mg/dL (ref 0.0–149.0)
VLDL: 19.4 mg/dL (ref 0.0–40.0)

## 2014-10-07 LAB — TESTOSTERONE, FREE, DIRECT
Testosterone, Free: 5.4 pg/mL — ABNORMAL LOW (ref 6.6–18.1)
Testosterone, total: 457.8 ng/dL (ref 348.0–1197.0)

## 2014-10-07 NOTE — Assessment & Plan Note (Addendum)
He has had evaluation in May by Dr. Bernardo Heater.  No changes were made..  Risks of long term testosterone supplementation discussed vs benefits. His 6 week suspension resulted in a decreased in quality of life,  But his total testosterone level is normal  And his cholesterol has improved. Will recommend  continued suspension   Lab Results  Component Value Date   TESTOSTERONE 457.8 10/05/2014

## 2014-10-07 NOTE — Assessment & Plan Note (Signed)
Given his multiple risk factors for CAD referral to cardiology for stress testing was advised.

## 2014-10-07 NOTE — Assessment & Plan Note (Signed)
Resolved by repeat check,  No further workup  Lab Results  Component Value Date   WBC 5.9 10/05/2014   HGB 15.4 10/05/2014   HCT 44.9 10/05/2014   MCV 96.0 10/05/2014   PLT 203.0 10/05/2014

## 2014-10-07 NOTE — Assessment & Plan Note (Signed)
Thyroid function is WNL on current dose.  No current changes needed.   Lab Results  Component Value Date   TSH 1.73 07/01/2014

## 2014-10-07 NOTE — Assessment & Plan Note (Addendum)
Resistant to many trials of medications  Including atrovent nasal spray,  with intrusive symptoms ,Trial of singAgreed with having his ducts cleaneed followed by repeat ENT evaluation .

## 2015-01-25 ENCOUNTER — Other Ambulatory Visit: Payer: Self-pay | Admitting: Internal Medicine

## 2015-02-03 ENCOUNTER — Encounter: Payer: Self-pay | Admitting: Internal Medicine

## 2015-02-03 ENCOUNTER — Ambulatory Visit (INDEPENDENT_AMBULATORY_CARE_PROVIDER_SITE_OTHER): Payer: Commercial Managed Care - HMO | Admitting: Internal Medicine

## 2015-02-03 VITALS — BP 128/72 | HR 55 | Temp 97.6°F | Resp 12 | Ht 76.0 in | Wt 198.2 lb

## 2015-02-03 DIAGNOSIS — R29898 Other symptoms and signs involving the musculoskeletal system: Secondary | ICD-10-CM

## 2015-02-03 DIAGNOSIS — M6281 Muscle weakness (generalized): Secondary | ICD-10-CM | POA: Diagnosis not present

## 2015-02-03 DIAGNOSIS — E291 Testicular hypofunction: Secondary | ICD-10-CM

## 2015-02-03 DIAGNOSIS — H6123 Impacted cerumen, bilateral: Secondary | ICD-10-CM

## 2015-02-03 DIAGNOSIS — R5383 Other fatigue: Secondary | ICD-10-CM | POA: Diagnosis not present

## 2015-02-03 DIAGNOSIS — E785 Hyperlipidemia, unspecified: Secondary | ICD-10-CM | POA: Diagnosis not present

## 2015-02-03 DIAGNOSIS — E559 Vitamin D deficiency, unspecified: Secondary | ICD-10-CM

## 2015-02-03 DIAGNOSIS — R7989 Other specified abnormal findings of blood chemistry: Secondary | ICD-10-CM

## 2015-02-03 DIAGNOSIS — J300 Vasomotor rhinitis: Secondary | ICD-10-CM

## 2015-02-03 LAB — VITAMIN B12: Vitamin B-12: 828 pg/mL (ref 211–911)

## 2015-02-03 LAB — COMPLETE METABOLIC PANEL WITH GFR
ALT: 14 U/L (ref 9–46)
AST: 16 U/L (ref 10–35)
Albumin: 3.9 g/dL (ref 3.6–5.1)
Alkaline Phosphatase: 46 U/L (ref 40–115)
BUN: 20 mg/dL (ref 7–25)
CO2: 25 mmol/L (ref 20–31)
Calcium: 8.4 mg/dL — ABNORMAL LOW (ref 8.6–10.3)
Chloride: 104 mmol/L (ref 98–110)
Creat: 0.92 mg/dL (ref 0.70–1.11)
GFR, Est African American: 89 mL/min (ref >=60)
GFR, Est Non African American: 77 mL/min (ref >=60)
Glucose, Bld: 75 mg/dL (ref 65–99)
Potassium: 4.3 mmol/L (ref 3.5–5.3)
Sodium: 139 mmol/L (ref 135–146)
Total Bilirubin: 1 mg/dL (ref 0.2–1.2)
Total Protein: 6.2 g/dL (ref 6.1–8.1)

## 2015-02-03 LAB — CBC WITH DIFFERENTIAL/PLATELET
Basophils Absolute: 0 10*3/uL (ref 0.0–0.1)
Basophils Relative: 0.5 % (ref 0.0–3.0)
Eosinophils Absolute: 0.4 10*3/uL (ref 0.0–0.7)
Eosinophils Relative: 6.2 % — ABNORMAL HIGH (ref 0.0–5.0)
HCT: 44.4 % (ref 39.0–52.0)
Hemoglobin: 15.2 g/dL (ref 13.0–17.0)
Lymphocytes Relative: 24.5 % (ref 12.0–46.0)
Lymphs Abs: 1.5 10*3/uL (ref 0.7–4.0)
MCHC: 34.2 g/dL (ref 30.0–36.0)
MCV: 94.8 fl (ref 78.0–100.0)
Monocytes Absolute: 0.6 10*3/uL (ref 0.1–1.0)
Monocytes Relative: 9.3 % (ref 3.0–12.0)
Neutro Abs: 3.6 10*3/uL (ref 1.4–7.7)
Neutrophils Relative %: 59.5 % (ref 43.0–77.0)
Platelets: 237 10*3/uL (ref 150.0–400.0)
RBC: 4.69 Mil/uL (ref 4.22–5.81)
RDW: 13.2 % (ref 11.5–15.5)
WBC: 6 10*3/uL (ref 4.0–10.5)

## 2015-02-03 LAB — CK: Total CK: 134 U/L (ref 7–232)

## 2015-02-03 LAB — TSH: TSH: 1.51 u[IU]/mL (ref 0.35–4.50)

## 2015-02-03 LAB — VITAMIN D 25 HYDROXY (VIT D DEFICIENCY, FRACTURES): VITD: 39.25 ng/mL (ref 30.00–100.00)

## 2015-02-03 MED ORDER — MOMETASONE FUROATE 0.1 % EX CREA: 1.0000 "application " | TOPICAL_CREAM | Freq: Every day | CUTANEOUS | Status: DC

## 2015-02-03 NOTE — Progress Notes (Signed)
Subjective:  Patient ID: Theodore [name], male    DOB: 1929-12-16  Age: 81 y.o. MRN: [id number]  CC: The primary encounter diagnosis was Other fatigue. Diagnoses of Low testosterone, Vitamin D deficiency, Hyperlipidemia, Muscle weakness, Complaints of leg weakness, Nonallergic vasomotor rhinitis, and Cerumen impaction, bilateral were also pertinent to this visit.  HPI Theodore [name] presents for follow up on multiple complaints. Most of which are chronic .  He reports that his legs feel weak at night, doesn't feel steady on his feet when walking.  Present for the last 6 months .  Getting worse,  Feels wiped out after doing exercise classes (2/week, plus Tai Chi  ,  Bosu ball) .  No falls   No true vertigo.  Uses a lot of alternative medicine  Leg cramps occurring nearly every night, but  Not with exercise  Persistent rhinitis .  Has been chronic for many years,  No improvement with multiple  drug trials and ENT evaluations.  Does not recall trying benadryl       Outpatient Prescriptions Prior to Visit  Medication Sig Dispense Refill  . cholecalciferol (VITAMIN D) 1000 UNITS tablet Take 5,000 Units by mouth daily.     [name] co-enzyme Q-10 30 MG capsule Take 30 mg by mouth 3 (three) times daily.    . COD LIVER OIL PO Take by mouth.    . cyanocobalamin 500 MCG tablet Take 500 mcg by mouth daily.    . Flaxseed, Linseed, (FLAX SEEDS PO) Take by mouth.    . Hydrogen Peroxide 30 % SOLN by Does not apply route. 5 drops per day in water (food grade)    . Iodine, Kelp, 0.15 MG TABS Take 1 tablet by mouth daily.    [name] levothyroxine (SYNTHROID, LEVOTHROID) 100 MCG tablet TAKE 1 TABLET DAILY BEFORE BREAKFAST 90 tablet 1  . magnesium oxide (MAG-OX) 400 MG tablet Take 400 mg by mouth daily.    [name] OVER THE COUNTER MEDICATION Tumeric daily    . OVER THE COUNTER MEDICATION Standard Process- Cardio Plus    . OVER THE COUNTER MEDICATION Standard Process- [place]    . OVER THE COUNTER MEDICATION Standard  Process- Folic Acid    . OVER THE COUNTER MEDICATION Standard PRocess- Prosynbiotic    . Probiotic Product (PROBIOTIC ACIDOPHILUS) CAPS Take 1 capsule by mouth daily.    . vitamin E (VITAMIN E) 400 UNIT capsule Take 400 Units by mouth daily.    . meloxicam (MOBIC) 15 MG tablet Take 1 tablet by mouth daily.  3  . ALPRAZolam (XANAX) 0.25 MG tablet Take 0.25 mg by mouth daily as needed for anxiety.    . Azelastine-Fluticasone 137-50 MCG/ACT SUSP Place 1 spray into the nose 2 (two) times daily. (Patient not taking: Reported on 10/05/2014) 23 g 5  . montelukast (SINGULAIR) 10 MG tablet Take 1 tablet (10 mg total) by mouth at bedtime. (Patient not taking: Reported on 02/03/2015) 30 tablet 3  . phenytoin (DILANTIN) 100 MG ER capsule Take 2 capsules (200 mg total) by mouth daily. (Patient not taking: Reported on 02/03/2015) 180 capsule 1  . S-Adenosylmethionine (SAM-E PO) Take by mouth.     No facility-administered medications prior to visit.    Review of Systems;  Patient denies headache, fevers, malaise, unintentional weight loss, skin rash, eye pain, sinus congestion and sinus pain, sore throat, dysphagia,  hemoptysis , cough, dyspnea, wheezing, chest pain, palpitations, orthopnea, edema, abdominal pain, nausea, melena, diarrhea, constipation, flank pain, dysuria, hematuria,  urinary  Frequency, nocturia, numbness, tingling, seizures,  Focal weakness, Loss of consciousness,  Tremor, insomnia, depression, anxiety, and suicidal ideation.      Objective:  BP 128/72 mmHg  Pulse 55  Temp(Src) 97.6 F (36.4 C) (Oral)  Resp 12  Ht 6' 4"  (1.93 m)  Wt 198 lb 4 oz (89.926 kg)  BMI 24.14 kg/m2  SpO2 98%  BP Readings from Last 3 Encounters:  02/03/15 128/72  10/05/14 118/70  07/01/14 128/64    Wt Readings from Last 3 Encounters:  02/03/15 198 lb 4 oz (89.926 kg)  10/05/14 188 lb 8 oz (85.503 kg)  07/01/14 189 lb 8 oz (85.957 kg)    General appearance: alert, cooperative and appears stated  age Ears: normal TM's and bilateral cerumen impaction  Throat: lips, mucosa, and tongue normal; teeth and gums normal Neck: no adenopathy, no carotid bruit, supple, symmetrical, trachea midline and thyroid not enlarged, symmetric, no tenderness/mass/nodules Back: symmetric, no curvature. ROM normal. No CVA tenderness. Lungs: clear to auscultation bilaterally Heart: regular rate and rhythm, S1, S2 normal, no murmur, click, rub or gallop Abdomen: soft, non-tender; bowel sounds normal; no masses,  no organomegaly Pulses: 2+ and symmetric Skin: Skin color, texture, turgor normal. No rashes or lesions Neuro: CNs 2-12 intact. DTRs 2+/4 in biceps, brachioradialis, patellars and achilles. Muscle strength 5/5 in upper and lower exremities. Fine resting tremor bilaterally both hands cerebellar function normal. Romberg negative.  No pronator drift.   Gait normal.  Lymph nodes: Cervical, supraclavicular, and axillary nodes normal.  No results found for: HGBA1C  Lab Results  Component Value Date   CREATININE 1.18 10/05/2014   CREATININE 1.10 07/01/2014   CREATININE 1.1 01/26/2014    Lab Results  Component Value Date   WBC 6.0 02/03/2015   HGB 15.2 02/03/2015   HCT 44.4 02/03/2015   PLT 237.0 02/03/2015   GLUCOSE 83 10/05/2014   CHOL 208* 10/05/2014   TRIG 97.0 10/05/2014   HDL 56.60 10/05/2014   LDLCALC 132* 10/05/2014   ALT 14 10/05/2014   AST 14 10/05/2014   NA 142 10/05/2014   K 4.5 10/05/2014   CL 105 10/05/2014   CREATININE 1.18 10/05/2014   BUN 25* 10/05/2014   CO2 29 10/05/2014   TSH 1.51 02/03/2015   PSA 3.65 07/01/2014    No results found.  Assessment & Plan:   Problem List Items Addressed This Visit    Nonallergic vasomotor rhinitis    Trial of bedtime benadryl.  If not helpful, ENT referral to [facility]       Complaints of leg weakness    His exam is normal except for age related proximal muscle weakness which is mild.  PT referral made,  chck CK,  Testosterone and  thyroid levels are all normal.  Lab Results  Component Value Date   TSH 1.51 02/03/2015   Lab Results  Component Value Date   TESTOSTERONE 457.8 10/05/2014   Lab Results  Component Value Date   CKTOTAL 134 02/03/2015         Cerumen impaction    Eye drops advised,  Return for irrigation.       Fatigue - Primary   Relevant Orders   Vitamin B12 (Completed)   CBC with Differential/Platelet (Completed)   TSH (Completed)   COMPLETE METABOLIC PANEL WITH GFR   Vitamin D deficiency   Relevant Orders   VITAMIN D 25 Hydroxy (Vit-D Deficiency, Fractures) (Completed)    Other Visit Diagnoses    Low testosterone  Relevant Orders    Testosterone, Free, Total, SHBG    Hyperlipidemia        Muscle weakness        Relevant Orders    CK (Completed)    Ambulatory referral to Physical Therapy       I have discontinued Mr. Campo's ALPRAZolam, S-Adenosylmethionine (SAM-E PO), phenytoin, montelukast, and Azelastine-Fluticasone. I am also having him start on mometasone. Additionally, I am having him maintain his COD LIVER OIL PO, cholecalciferol, vitamin E, cyanocobalamin, magnesium oxide, co-enzyme Q-10, (Flaxseed, Linseed, (FLAX SEEDS PO)), Iodine (Kelp), Hydrogen Peroxide, OVER THE COUNTER MEDICATION, OVER THE COUNTER MEDICATION, OVER THE COUNTER MEDICATION, OVER THE COUNTER MEDICATION, OVER THE COUNTER MEDICATION, PROBIOTIC ACIDOPHILUS, meloxicam, and levothyroxine.  Meds ordered this encounter  Medications  . mometasone (ELOCON) 0.1 % cream    Sig: Apply 1 application topically daily. To ear canal for itching    Dispense:  45 g    Refill:  1    Medications Discontinued During This Encounter  Medication Reason  . ALPRAZolam (XANAX) 0.25 MG tablet Patient Preference  . Azelastine-Fluticasone 137-50 MCG/ACT SUSP Patient Preference  . S-Adenosylmethionine (SAM-E PO) Error  . montelukast (SINGULAIR) 10 MG tablet   . phenytoin (DILANTIN) 100 MG ER capsule     A total of  40 minutes was spent with patient more than half of which was spent in counseling patient on the above mentioned issues , reviewing and explaining recent labs and imaging studies done, and coordination of care. Follow-up: Return in about 5 days (around 02/08/2015), or RN visit for ear irrigation .   [name], MD

## 2015-02-03 NOTE — Progress Notes (Signed)
Pre-visit discussion using our clinic review tool. No additional management support is needed unless otherwise documented below in the visit note.  

## 2015-02-03 NOTE — Patient Instructions (Signed)
If your B12 level is low normal,  We can try B12 injections to see if your fatigue improves  We should  consider a sleep study test if yoru labs are normal  Referral for Physical therapy evaluation  Has been initiated.  Return next Monday for an ear cleaning  Mometasone ointment can be used once or twice daily for "chy ears" due to eczema  Try benadryl 25 mg at bedtime for the runny nose

## 2015-02-05 DIAGNOSIS — R29898 Other symptoms and signs involving the musculoskeletal system: Secondary | ICD-10-CM | POA: Insufficient documentation

## 2015-02-06 DIAGNOSIS — H6120 Impacted cerumen, unspecified ear: Secondary | ICD-10-CM | POA: Insufficient documentation

## 2015-02-06 NOTE — Assessment & Plan Note (Signed)
Trial of bedtime benadryl.  If not helpful, [facility] referral [facility]

## 2015-02-06 NOTE — Assessment & Plan Note (Signed)
His exam is normal except for age related proximal muscle weakness which is mild.  PT referral made,  chck CK,  Testosterone and thyroid levels are all normal.  Lab Results  Component Value Date   TSH 1.51 02/03/2015   Lab Results  Component Value Date   TESTOSTERONE 457.8 10/05/2014   Lab Results  Component Value Date   CKTOTAL 134 02/03/2015

## 2015-02-06 NOTE — Assessment & Plan Note (Signed)
Eye drops advised,  Return for irrigation.

## 2015-02-08 ENCOUNTER — Encounter: Payer: Self-pay | Admitting: *Deleted

## 2015-02-08 LAB — TESTOSTERONE, FREE, TOTAL, SHBG
Sex Hormone Binding: 49 nmol/L (ref 22–77)
Testosterone, Free: 38 pg/mL — ABNORMAL LOW (ref 47.0–244.0)
Testosterone-% Free: 1.5 % — ABNORMAL LOW (ref 1.6–2.9)
Testosterone: 254 ng/dL — ABNORMAL LOW (ref 300–890)

## 2015-02-09 ENCOUNTER — Ambulatory Visit (INDEPENDENT_AMBULATORY_CARE_PROVIDER_SITE_OTHER): Payer: Commercial Managed Care - HMO

## 2015-02-09 DIAGNOSIS — T169XXA Foreign body in ear, unspecified ear, initial encounter: Secondary | ICD-10-CM

## 2015-02-09 DIAGNOSIS — H6123 Impacted cerumen, bilateral: Secondary | ICD-10-CM | POA: Diagnosis not present

## 2015-02-09 NOTE — Progress Notes (Addendum)
Patient came in for bilateral ear irrigations.  Inspected left ear and noted whitish, light yellow colored wax in the ear.  Irrigated with warm water and hydrogen peroxide.  Medium amount of wax removed with irrigation and a end cap of his hearing aid removed with orange wax noted on it.  Inspected ear again and noted a additional hearing aid piece noted.  Stopped irrigation and had C. Doss, NP come into room to inspect his ears.  She noted that the hearing aid piece was visible and noted in right ear a piece was noted there.  Neither ear was wax noted.  Came out to talk with Dr. Derrel Nip and suggested a referral to ENT per patient choice.    Patient stated he has seen Amadeo Garnet in [place] at [place] prior.  Would like a Referral to him.    Please advise.     I have reviewed the above information and agree with above. Referral to Dr Amadeo Garnet for removal of foreign body (hearing aid cap)   [name], MD

## 2015-02-09 NOTE — Addendum Note (Signed)
Addended by: [name] on: 02/09/2015 07:59 PM   Modules accepted: Orders

## 2015-02-11 MED ORDER — TESTOSTERONE CYPIONATE 100 MG/ML IM SOLN: 50.0000 mg | INTRAMUSCULAR | Status: DC

## 2015-02-11 NOTE — Addendum Note (Signed)
Addended by: [name] on: 02/11/2015 06:39 AM   Modules accepted: Orders

## 2015-02-12 ENCOUNTER — Other Ambulatory Visit: Payer: Self-pay | Admitting: *Deleted

## 2015-02-12 ENCOUNTER — Telehealth: Payer: Self-pay | Admitting: *Deleted

## 2015-02-12 MED ORDER — TESTOSTERONE CYPIONATE 100 MG/ML IM SOLN: 50.0000 mg | INTRAMUSCULAR | Status: DC

## 2015-02-12 NOTE — Telephone Encounter (Signed)
Patient sript faxed to [facility]

## 2015-03-18 ENCOUNTER — Telehealth: Payer: Self-pay | Admitting: Internal Medicine

## 2015-03-18 DIAGNOSIS — R7989 Other specified abnormal findings of blood chemistry: Secondary | ICD-10-CM

## 2015-03-18 DIAGNOSIS — Z79899 Other long term (current) drug therapy: Secondary | ICD-10-CM

## 2015-03-18 NOTE — Telephone Encounter (Signed)
Pt wanted to know when he should get his Testerone level rechecked. Pt has AWV on 1/17. Please advise pt/msn

## 2015-03-18 NOTE — Telephone Encounter (Signed)
He can have it checked a minimum of 6 weeks after starting supplementation,  One week prior to injection (he injects every 2 weeks).  Fasting labs needed and ordered .

## 2015-03-18 NOTE — Telephone Encounter (Signed)
Was last checked in end of November, restarted supplementation.  Please advise.

## 2015-03-19 NOTE — Telephone Encounter (Signed)
Called and scheduled patient for 1030 on Monday for labs

## 2015-03-22 ENCOUNTER — Other Ambulatory Visit: Payer: Commercial Managed Care - HMO

## 2015-03-23 ENCOUNTER — Other Ambulatory Visit (INDEPENDENT_AMBULATORY_CARE_PROVIDER_SITE_OTHER): Payer: PPO

## 2015-03-23 ENCOUNTER — Other Ambulatory Visit: Payer: Self-pay | Admitting: Internal Medicine

## 2015-03-23 DIAGNOSIS — E291 Testicular hypofunction: Secondary | ICD-10-CM

## 2015-03-23 DIAGNOSIS — Z79899 Other long term (current) drug therapy: Secondary | ICD-10-CM | POA: Diagnosis not present

## 2015-03-23 DIAGNOSIS — R7989 Other specified abnormal findings of blood chemistry: Secondary | ICD-10-CM

## 2015-03-23 LAB — CBC WITH DIFFERENTIAL/PLATELET
Basophils Absolute: 0 10*3/uL (ref 0.0–0.1)
Basophils Relative: 0.4 % (ref 0.0–3.0)
Eosinophils Absolute: 0.3 10*3/uL (ref 0.0–0.7)
Eosinophils Relative: 5.3 % — ABNORMAL HIGH (ref 0.0–5.0)
HCT: 49.7 % (ref 39.0–52.0)
Hemoglobin: 16.8 g/dL (ref 13.0–17.0)
Lymphocytes Relative: 20.9 % (ref 12.0–46.0)
Lymphs Abs: 1.3 10*3/uL (ref 0.7–4.0)
MCHC: 33.8 g/dL (ref 30.0–36.0)
MCV: 94.8 fl (ref 78.0–100.0)
Monocytes Absolute: 0.5 10*3/uL (ref 0.1–1.0)
Monocytes Relative: 7.8 % (ref 3.0–12.0)
Neutro Abs: 4.1 10*3/uL (ref 1.4–7.7)
Neutrophils Relative %: 65.6 % (ref 43.0–77.0)
Platelets: 248 10*3/uL (ref 150.0–400.0)
RBC: 5.24 Mil/uL (ref 4.22–5.81)
RDW: 13.9 % (ref 11.5–15.5)
WBC: 6.3 10*3/uL (ref 4.0–10.5)

## 2015-03-23 LAB — LIPID PANEL
Cholesterol: 256 mg/dL — ABNORMAL HIGH (ref 0–200)
HDL: 59.2 mg/dL (ref >39.00)
LDL Cholesterol: 182 mg/dL — ABNORMAL HIGH (ref 0–99)
NonHDL: 196.74
Total CHOL/HDL Ratio: 4
Triglycerides: 75 mg/dL (ref 0.0–149.0)
VLDL: 15 mg/dL (ref 0.0–40.0)

## 2015-03-23 LAB — HEPATIC FUNCTION PANEL
ALT: 15 U/L (ref 0–53)
AST: 25 U/L (ref 0–37)
Albumin: 4.5 g/dL (ref 3.5–5.2)
Alkaline Phosphatase: 49 U/L (ref 39–117)
Bilirubin, Direct: 0.1 mg/dL (ref 0.0–0.3)
Total Bilirubin: 0.9 mg/dL (ref 0.2–1.2)
Total Protein: 7.3 g/dL (ref 6.0–8.3)

## 2015-03-25 ENCOUNTER — Telehealth: Payer: Self-pay | Admitting: *Deleted

## 2015-03-25 NOTE — Telephone Encounter (Signed)
Patient has requested a call back for lab results from 03/23/15 Contact [phone]

## 2015-03-25 NOTE — Telephone Encounter (Signed)
Please advise results. Thanks 

## 2015-03-25 NOTE — Telephone Encounter (Signed)
Please inform patients who call for their results that it is not necessary and I will call them when I have the results.

## 2015-03-29 LAB — TESTOSTERONE TOTAL,FREE,BIO, MALES
Albumin: 4.3 g/dL (ref 3.6–5.1)
Sex Hormone Binding: 48 nmol/L (ref 22–77)
Testosterone, Bioavailable: 62.7 ng/dL — ABNORMAL LOW (ref 130.5–681.7)
Testosterone, Free: 31.8 pg/mL — ABNORMAL LOW (ref 47.0–244.0)
Testosterone: 338 ng/dL (ref 250–827)

## 2015-03-30 ENCOUNTER — Ambulatory Visit (INDEPENDENT_AMBULATORY_CARE_PROVIDER_SITE_OTHER): Payer: PPO

## 2015-03-30 VITALS — BP 136/68 | HR 60 | Temp 97.2°F | Resp 14 | Ht 75.0 in | Wt 199.4 lb

## 2015-03-30 DIAGNOSIS — Z0000 Encounter for general adult medical examination without abnormal findings: Secondary | ICD-10-CM | POA: Diagnosis not present

## 2015-03-30 NOTE — Patient Instructions (Addendum)
Theodore [name],  Thank you for taking time to come for your Medicare Wellness Visit.  I appreciate your ongoing commitment to your health goals. Please review the following plan we discussed and let me know if I can assist you in the future. Health Maintenance, Male A healthy lifestyle and preventative care can promote health and wellness.  Maintain regular health, dental, and eye exams.  Eat a healthy diet. Foods like vegetables, fruits, whole grains, low-fat dairy products, and lean protein foods contain the nutrients you need and are low in calories. Decrease your intake of foods high in solid fats, added sugars, and salt. Get information about a proper diet from your health care provider, if necessary.  Regular physical exercise is one of the most important things you can do for your health. Most adults should get at least 150 minutes of moderate-intensity exercise (any activity that increases your heart rate and causes you to sweat) each week. In addition, most adults need muscle-strengthening exercises on 2 or more days a week.   Maintain a healthy weight. The body mass index (BMI) is a screening tool to identify possible weight problems. It provides an estimate of body fat based on height and weight. Your health care provider can find your BMI and can help you achieve or maintain a healthy weight. For males 20 years and older:  A BMI below 18.5 is considered underweight.  A BMI of 18.5 to 24.9 is normal.  A BMI of 25 to 29.9 is considered overweight.  A BMI of 30 and above is considered obese.  Maintain normal blood lipids and cholesterol by exercising and minimizing your intake of saturated fat. Eat a balanced diet with plenty of fruits and vegetables. Blood tests for lipids and cholesterol should begin at age 71 and be repeated every 5 years. If your lipid or cholesterol levels are high, you are over age 32, or you are at high risk for heart disease, you may need your cholesterol levels  checked more frequently.Ongoing high lipid and cholesterol levels should be treated with medicines if diet and exercise are not working.  If you smoke, find out from your health care provider how to quit. If you do not use tobacco, do not start.  Lung cancer screening is recommended for adults aged 68-80 years who are at high risk for developing lung cancer because of a history of smoking. A yearly low-dose CT scan of the lungs is recommended for people who have at least a 30-pack-year history of smoking and are current smokers or have quit within the past 15 years. A pack year of smoking is smoking an average of 1 pack of cigarettes a day for 1 year (for example, a 30-pack-year history of smoking could mean smoking 1 pack a day for 30 years or 2 packs a day for 15 years). Yearly screening should continue until the smoker has stopped smoking for at least 15 years. Yearly screening should be stopped for people who develop a health problem that would prevent them from having lung cancer treatment.  If you choose to drink alcohol, do not have more than 2 drinks per day. One drink is considered to be 12 oz (360 mL) of beer, 5 oz (150 mL) of wine, or 1.5 oz (45 mL) of liquor.  Avoid the use of street drugs. Do not share needles with anyone. Ask for help if you need support or instructions about stopping the use of drugs.  High blood pressure causes heart disease and  increases the risk of stroke. High blood pressure is more likely to develop in:  People who have blood pressure in the end of the normal range (100-139/85-89 mm Hg).  People who are overweight or obese.  People who are African American.  If you are 56-55 years of age, have your blood pressure checked every 3-5 years. If you are 27 years of age or older, have your blood pressure checked every year. You should have your blood pressure measured twice--once when you are at a hospital or clinic, and once when you are not at a hospital or clinic.  Record the average of the two measurements. To check your blood pressure when you are not at a hospital or clinic, you can use:  An automated blood pressure machine at a pharmacy.  A home blood pressure monitor.  If you are 51-21 years old, ask your health care provider if you should take aspirin to prevent heart disease.  Diabetes screening involves taking a blood sample to check your fasting blood sugar level. This should be done once every 3 years after age 87 if you are at a normal weight and without risk factors for diabetes. Testing should be considered at a younger age or be carried out more frequently if you are overweight and have at least 1 risk factor for diabetes.  Colorectal cancer can be detected and often prevented. Most routine colorectal cancer screening begins at the age of 39 and continues through age 86. However, your health care provider may recommend screening at an earlier age if you have risk factors for colon cancer. On a yearly basis, your health care provider may provide home test kits to check for hidden blood in the stool. A small camera at the end of a tube may be used to directly examine the colon (sigmoidoscopy or colonoscopy) to detect the earliest forms of colorectal cancer. Talk to your health care provider about this at age 45 when routine screening begins. A direct exam of the colon should be repeated every 5-10 years through age 43, unless early forms of precancerous polyps or small growths are found.  People who are at an increased risk for hepatitis B should be screened for this virus. You are considered at high risk for hepatitis B if:  You were born in a country where hepatitis B occurs often. Talk with your health care provider about which countries are considered high risk.  Your parents were born in a high-risk country and you have not received a shot to protect against hepatitis B (hepatitis B vaccine).  You have HIV or AIDS.  You use needles to  inject street drugs.  You live with, or have sex with, someone who has hepatitis B.  You are a man who has sex with other men (MSM).  You get hemodialysis treatment.  You take certain medicines for conditions like cancer, organ transplantation, and autoimmune conditions.  Hepatitis C blood testing is recommended for all people born from [birth year] through 1963 and any individual with known risk factors for hepatitis C.  Healthy men should no longer receive prostate-specific antigen (PSA) blood tests as part of routine cancer screening. Talk to your health care provider about prostate cancer screening.  Testicular cancer screening is not recommended for adolescents or adult males who have no symptoms. Screening includes self-exam, a health care provider exam, and other screening tests. Consult with your health care provider about any symptoms you have or any concerns you have about testicular cancer.  Practice safe sex. Use condoms and avoid high-risk sexual practices to reduce the spread of sexually transmitted infections (STIs).  You should be screened for STIs, including gonorrhea and chlamydia if:  You are sexually active and are younger than 24 years.  You are older than 24 years, and your health care provider tells you that you are at risk for this type of infection.  Your sexual activity has changed since you were last screened, and you are at an increased risk for chlamydia or gonorrhea. Ask your health care provider if you are at risk.  If you are at risk of being infected with HIV, it is recommended that you take a prescription medicine daily to prevent HIV infection. This is called pre-exposure prophylaxis (PrEP). You are considered at risk if:  You are a man who has sex with other men (MSM).  You are a heterosexual man who is sexually active with multiple partners.  You take drugs by injection.  You are sexually active with a partner who has HIV.  Talk with your health care  provider about whether you are at high risk of being infected with HIV. If you choose to begin PrEP, you should first be tested for HIV. You should then be tested every 3 months for as long as you are taking PrEP.  Use sunscreen. Apply sunscreen liberally and repeatedly throughout the day. You should seek shade when your shadow is shorter than you. Protect yourself by wearing long sleeves, pants, a wide-brimmed hat, and sunglasses year round whenever you are outdoors.  Tell your health care provider of new moles or changes in moles, especially if there is a change in shape or color. Also, tell your health care provider if a mole is larger than the size of a pencil eraser.  A one-time screening for abdominal aortic aneurysm (AAA) and surgical repair of large AAAs by ultrasound is recommended for men aged 82-75 years who are current or former smokers.  Stay current with your vaccines (immunizations).   This information is not intended to replace advice given to you by your health care provider. Make sure you discuss any questions you have with your health care provider.   Document Released: 08/26/2007 Document Revised: 03/20/2014 Document Reviewed: 07/25/2010 Elsevier Interactive Patient Education [employer].

## 2015-03-30 NOTE — Progress Notes (Signed)
Subjective:   Theodore [name] is a 82 y.o. male who presents for an Initial Medicare Annual Wellness Visit.  Review of Systems  No ROS.  Medicare Wellness Visit.  Cardiac Risk Factors include: male gender;advanced age (>63men, >50 women)    Objective:    Today's Vitals   03/30/15 1416  BP: 136/68  Pulse: 60  Temp: 97.2 F (36.2 C)  TempSrc: Oral  Resp: 14  Height: 6\' 3"  (1.905 m)  Weight: 199 lb 6.4 oz (90.447 kg)  SpO2: 98%    Current Medications (verified) Outpatient Encounter Prescriptions as of 03/30/2015  Medication Sig  . cholecalciferol (VITAMIN D) 1000 UNITS tablet Take 5,000 Units by mouth daily.   [name] co-enzyme Q-10 30 MG capsule Take 30 mg by mouth 3 (three) times daily.  . COD LIVER OIL PO Take by mouth.  . cyanocobalamin 500 MCG tablet Take 500 mcg by mouth daily.  . Flaxseed, Linseed, (FLAX SEEDS PO) Take by mouth.  . Hydrogen Peroxide 30 % SOLN by Does not apply route. 5 drops per day in water (food grade)  . Iodine, Kelp, 0.15 MG TABS Take 1 tablet by mouth daily.  [name] levothyroxine (SYNTHROID, LEVOTHROID) 100 MCG tablet TAKE 1 TABLET DAILY BEFORE BREAKFAST  . magnesium oxide (MAG-OX) 400 MG tablet Take 400 mg by mouth daily.  . meloxicam (MOBIC) 15 MG tablet Take 1 tablet by mouth daily.  . mometasone (ELOCON) 0.1 % cream Apply 1 application topically daily. To ear canal for itching  . OVER THE COUNTER MEDICATION Tumeric daily  . OVER THE COUNTER MEDICATION Standard Process- Cardio Plus  . OVER THE COUNTER MEDICATION Standard Process- [place]  . OVER THE COUNTER MEDICATION Standard Process- Folic Acid  . OVER THE COUNTER MEDICATION Standard PRocess- Prosynbiotic  . Probiotic Product (PROBIOTIC ACIDOPHILUS) CAPS Take 1 capsule by mouth daily.  [name] testosterone cypionate (DEPOTESTOTERONE CYPIONATE) 100 MG/ML injection Inject 0.5 mLs (50 mg total) into the muscle every 14 (fourteen) days. For IM use only, every 10 days  . vitamin E (VITAMIN E) 400 UNIT capsule  Take 400 Units by mouth daily.   No facility-administered encounter medications on file as of 03/30/2015.    Allergies (verified) Penicillins   History: Past Medical History  Diagnosis Date  . Arthritis   . Cancer [facility]) 2002    melanoma right ear  . Depression   . Thyroid disease     hypothyroid  . Colon polyp   . Tremors of nervous system     Benign   Past Surgical History  Procedure Laterality Date  . Joint replacement Right 2003    knee  . Melanoma excision Right     ear. Followed by Dr. Nehemiah Massed   Family History  Problem Relation Age of Onset  . Hypertension Mother   . Heart disease Mother     CHF  . Heart disease Father   . Cancer Sister     melanoma   Social History   Occupational History  . Retired     Architect -    Social History Main Topics  . Smoking status: Never Smoker   . Smokeless tobacco: Never Used  . Alcohol Use: Yes     Comment: 1 glass of wine daily  . Drug Use: No  . Sexual Activity: Not Currently   Tobacco Counseling Counseling given: Not Answered   Activities of Daily Living In your present state of health, do you have any difficulty performing the following activities: 03/30/2015  Hearing? Y  Vision? N  Difficulty concentrating or making decisions? N  Walking or climbing stairs? N  Dressing or bathing? N  Doing errands, shopping? N  Preparing Food and eating ? N  Using the Toilet? N  In the past six months, have you accidently leaked urine? N  Do you have problems with loss of bowel control? N  Managing your Medications? N  Managing your Finances? N  Housekeeping or managing your Housekeeping? N    Immunizations and Health Maintenance Immunization History  Administered Date(s) Administered  . Influenza,inj,Quad PF,36+ Mos 01/14/2014  . Influenza-Unspecified 12/11/2012  . Pneumococcal Conjugate-13 01/14/2014  . Pneumococcal Polysaccharide-23 04/28/2012  . Tdap 08/19/2014  . Zoster 04/28/2010   There are no  preventive care reminders to display for this patient.  Patient Care Team: [name], MD as PCP - General (Internal Medicine)  Indicate any recent Medical Services you may have received from other than [facility] providers in the past year (date may be approximate).    Assessment:   This is a routine wellness examination for Theodore [name]. The goal of the wellness visit is to assist the patient how to close the gaps in care and create a preventative care plan for the patient.   VIT D Calcium as appropriate/ Osteoporosis risk reviewed.  Medications reviewed; taking without issues or barriers.  Safety issues reviewed; smoke detectors in the home. No firearms in the home. Wears seatbelts when driving or riding with others. No violence in the home.  The patient was oriented x 3; appropriate in dress and manner and no objective failures at ADL's or IADL's.   Patient Concerns:  Aware of memory changes; sidetracks easily, becoming forgetful.  Requests recent Testosterone lab results as soon as possible.  Requests second referral to be placed to [facility] for unsteady gait; new insurance/Health Alert.  Deferred to PCP for follow up.  Hearing/Vision screen Hearing Screening Comments: Wears hearing aids Vision Screening Comments: Followed by [facility] Annual visits Wears glasses  Dietary issues and exercise activities discussed: Current Exercise Habits:: Structured exercise class, Type of exercise: calisthenics;exercise ball;strength training/weights;stretching, Time (Minutes): 60, Frequency (Times/Week): 5, Weekly Exercise (Minutes/Week): 300, Intensity: Moderate  Goals    . Healthy Lifestyle     Maintain exercise regiment. Increase water intake, stay hydrated. Make healthy food choices when eating, choose lean meats, fruits and vegetables.      Depression Screen PHQ 2/9 Scores 03/30/2015  PHQ - 2 Score 0    Fall Risk Fall Risk  03/30/2015  Falls in the past year? No     Cognitive Function: MMSE - Mini Mental State Exam 03/30/2015  Orientation to time 5  Orientation to Place 5  Registration 3  Attention/ Calculation 5  Recall 3  Language- name 2 objects 2  Language- repeat 1  Language- follow 3 step command 3  Language- read & follow direction 1  Write a sentence 0  Write a sentence-comments Some difficulty. Tremors.  Copy design 1  Total score 29    Screening Tests Health Maintenance  Topic Date Due  . INFLUENZA VACCINE  02/10/2016 (Originally 10/12/2014)  . TETANUS/TDAP  08/18/2024  . ZOSTAVAX  Completed  . PNA vac Low Risk Adult  Completed        Plan:   End of life planning; Advance aging; Advanced directives discussed. Copy requested of current HCPOA/Living Will.    During the course of the visit Theodore [name] was educated and counseled about the following appropriate screening  and preventive services:   Vaccines to include Pneumoccal, Influenza, Hepatitis B, Td, Zostavax, HCV  Electrocardiogram  Colorectal cancer screening  Cardiovascular disease screening  Diabetes screening  Glaucoma screening  Nutrition counseling  Prostate cancer screening  Smoking cessation counseling  Patient Instructions (the written plan) were given to the patient.   Varney Biles, LPN   [phone]

## 2015-03-30 NOTE — Progress Notes (Signed)
  I have reviewed the above information and agree with above.   Jessie Cowher, MD 

## 2015-04-01 ENCOUNTER — Telehealth: Payer: Self-pay

## 2015-04-01 NOTE — Telephone Encounter (Signed)
Change in medication dose documented.

## 2015-04-07 ENCOUNTER — Encounter: Payer: Self-pay | Admitting: Internal Medicine

## 2015-04-09 DIAGNOSIS — R269 Unspecified abnormalities of gait and mobility: Secondary | ICD-10-CM | POA: Diagnosis not present

## 2015-04-09 DIAGNOSIS — M6281 Muscle weakness (generalized): Secondary | ICD-10-CM | POA: Diagnosis not present

## 2015-04-13 ENCOUNTER — Telehealth: Payer: Self-pay | Admitting: Internal Medicine

## 2015-04-13 DIAGNOSIS — R29898 Other symptoms and signs involving the musculoskeletal system: Secondary | ICD-10-CM

## 2015-04-13 NOTE — Telephone Encounter (Signed)
Pt needs a new order for physical therapy at [facility]. Pt actually saw physical therapy on 04/09/15. The therapist request for the order to be back date for 04/09/15.msn

## 2015-04-14 DIAGNOSIS — R269 Unspecified abnormalities of gait and mobility: Secondary | ICD-10-CM | POA: Diagnosis not present

## 2015-04-14 NOTE — Telephone Encounter (Signed)
There is no way to back datean EPIC generated order  unless the patient had been seen by our office on that date, and he was not .   I wrote "to be started jan 27",  That's the best I can do

## 2015-06-01 ENCOUNTER — Other Ambulatory Visit: Payer: Self-pay

## 2015-06-01 ENCOUNTER — Telehealth: Payer: Self-pay | Admitting: Internal Medicine

## 2015-06-01 MED ORDER — LEVOTHYROXINE SODIUM 100 MCG PO TABS: 100.0000 ug | ORAL_TABLET | Freq: Every day | ORAL | Status: DC

## 2015-06-01 NOTE — Telephone Encounter (Signed)
[facility] and [facility] supply was [facility] to [facility] until his mail order gets to his home.

## 2015-06-01 NOTE — Telephone Encounter (Signed)
Pt was in office this morning. He has changed insurance company's and wants to make sure his mail prescriptions will be sent to the correct mail pharmacy. Pt's new insurance is  [employer]. Please call pt at home to advice.  Thanks.

## 2015-07-19 DIAGNOSIS — D229 Melanocytic nevi, unspecified: Secondary | ICD-10-CM | POA: Diagnosis not present

## 2015-07-19 DIAGNOSIS — Z1283 Encounter for screening for malignant neoplasm of skin: Secondary | ICD-10-CM | POA: Diagnosis not present

## 2015-07-19 DIAGNOSIS — L821 Other seborrheic keratosis: Secondary | ICD-10-CM | POA: Diagnosis not present

## 2015-07-19 DIAGNOSIS — L570 Actinic keratosis: Secondary | ICD-10-CM | POA: Diagnosis not present

## 2015-07-19 DIAGNOSIS — L578 Other skin changes due to chronic exposure to nonionizing radiation: Secondary | ICD-10-CM | POA: Diagnosis not present

## 2015-07-19 DIAGNOSIS — B351 Tinea unguium: Secondary | ICD-10-CM | POA: Diagnosis not present

## 2015-07-19 DIAGNOSIS — Z85820 Personal history of malignant melanoma of skin: Secondary | ICD-10-CM | POA: Diagnosis not present

## 2015-07-19 DIAGNOSIS — D1800 Hemangioma unspecified site: Secondary | ICD-10-CM | POA: Diagnosis not present

## 2015-08-20 ENCOUNTER — Telehealth: Payer: Self-pay | Admitting: Internal Medicine

## 2015-08-20 ENCOUNTER — Other Ambulatory Visit: Payer: Self-pay | Admitting: Internal Medicine

## 2015-08-20 DIAGNOSIS — R7989 Other specified abnormal findings of blood chemistry: Secondary | ICD-10-CM

## 2015-08-20 DIAGNOSIS — R5383 Other fatigue: Secondary | ICD-10-CM

## 2015-08-20 DIAGNOSIS — Z79899 Other long term (current) drug therapy: Secondary | ICD-10-CM

## 2015-08-20 MED ORDER — TESTOSTERONE CYPIONATE 100 MG/ML IM SOLN: 25.0000 mg | INTRAMUSCULAR | Status: DC

## 2015-08-20 NOTE — Telephone Encounter (Signed)
testosterone cypionate (DEPOTESTOSTERONE CYPIONATE) 200 MG/ML injection

## 2015-08-20 NOTE — Telephone Encounter (Signed)
Free testosterone date 03/23/15 and last script written was for 100 mg/per ML but is historical please advise can I fill?

## 2015-08-20 NOTE — Telephone Encounter (Signed)
TESTOSTERONE REFILLED.  NEEDS LABS DONE MIDWAY BETWEEN DOSES.  LABS ORDERED

## 2015-08-20 NOTE — Telephone Encounter (Signed)
Last fill:05/09/15 Last office visit: 02/03/15 No future appointment scheduled Okay to refill?

## 2015-08-23 NOTE — Telephone Encounter (Signed)
Patient needs lab appointment and follow up appointment.

## 2015-08-23 NOTE — Telephone Encounter (Signed)
Pt called back. Tried to get a lab and follow up appointment set up. He says that will not work. He is out of his medication. Please advise pt.  Thanks

## 2015-08-24 ENCOUNTER — Telehealth: Payer: Self-pay | Admitting: Internal Medicine

## 2015-08-24 NOTE — Telephone Encounter (Signed)
Theodore [name] from [place], [phone]. Question on pt's testosterone cypionate (DEPOTESTOTERONE CYPIONATE) 100 MG/ML injection. Would like to fill the 200 mg/ml.

## 2015-08-24 NOTE — Telephone Encounter (Signed)
The front desk is setting up appointment for patient for labs I will have to ask MD as to the 100 mg/ ml versus the 200 mg/ ml if pharmacy can use this dose.

## 2015-08-24 NOTE — Telephone Encounter (Signed)
Juliann Pulse,  I see that the patient needed a appt, did that get completed? Have you talked with him.  Looks like Dr. Derrel Nip ordered the 100/ is that pending the labs, I just want to make sure all was correct.thanks

## 2015-08-24 NOTE — Telephone Encounter (Signed)
Just let me know I will call them back. thanks

## 2015-08-24 NOTE — Telephone Encounter (Signed)
Patient medication has been sent but needs a lab appointment for next refill followed by an appointment for follow up.

## 2015-08-24 NOTE — Telephone Encounter (Signed)
Lab and follow up appointment have been made.

## 2015-08-25 ENCOUNTER — Other Ambulatory Visit: Payer: Self-pay | Admitting: *Deleted

## 2015-08-25 MED ORDER — TESTOSTERONE CYPIONATE 200 MG/ML IM SOLN: INTRAMUSCULAR | Status: DC

## 2015-08-25 MED ORDER — TESTOSTERONE CYPIONATE 100 MG/ML IM SOLN: 25.0000 mg | INTRAMUSCULAR | Status: DC

## 2015-08-25 NOTE — Progress Notes (Signed)
[facility]

## 2015-08-27 ENCOUNTER — Telehealth: Payer: Self-pay

## 2015-08-27 ENCOUNTER — Telehealth: Payer: Self-pay | Admitting: *Deleted

## 2015-08-27 NOTE — Telephone Encounter (Signed)
Transferred call to triage.

## 2015-08-27 NOTE — Telephone Encounter (Signed)
Patients pharmacy wants to change patients testosterone injection from 100mg  to 200mg  and change the amount he is dosing himself with.   Call came from [place] @ [phone]

## 2015-08-27 NOTE — Telephone Encounter (Signed)
Courtney from [place] requested a call, she has a direct number [phone].

## 2015-08-29 MED ORDER — TESTOSTERONE CYPIONATE 100 MG/ML IM SOLN: 25.0000 mg | INTRAMUSCULAR | Status: DC

## 2015-08-29 NOTE — Addendum Note (Signed)
Addended by: [name] on: 08/29/2015 04:34 PM   Modules accepted: Orders

## 2015-08-29 NOTE — Telephone Encounter (Signed)
Medication changed  to the 200 mg/ml strength .  Does is 0.25 ml which is 50 mg ,  Eery 14 days.  He needs to have fasting labs done one week after his next dose.

## 2015-08-30 NOTE — Telephone Encounter (Signed)
Left detailed message for pharmacy patient has medication filled locally.

## 2015-08-31 ENCOUNTER — Telehealth: Payer: Self-pay | Admitting: Internal Medicine

## 2015-08-31 NOTE — Telephone Encounter (Signed)
Stacey [phone] called from [place] regarding pt testosterone. She wants to know if it's ok to change the strength from 100 mg per ml to 200 mg per ml and pt will inject himself and use half as much.   Erline Levine will only be in the office today. Thank you!

## 2015-09-02 ENCOUNTER — Other Ambulatory Visit (INDEPENDENT_AMBULATORY_CARE_PROVIDER_SITE_OTHER): Payer: PPO

## 2015-09-02 DIAGNOSIS — R5383 Other fatigue: Secondary | ICD-10-CM | POA: Diagnosis not present

## 2015-09-02 DIAGNOSIS — Z79899 Other long term (current) drug therapy: Secondary | ICD-10-CM | POA: Diagnosis not present

## 2015-09-02 DIAGNOSIS — E291 Testicular hypofunction: Secondary | ICD-10-CM | POA: Diagnosis not present

## 2015-09-02 DIAGNOSIS — R7989 Other specified abnormal findings of blood chemistry: Secondary | ICD-10-CM

## 2015-09-03 LAB — TESTOSTERONE TOTAL,FREE,BIO, MALES
Albumin: 4 g/dL (ref 3.6–5.1)
Sex Hormone Binding: 52 nmol/L (ref 22–77)
Testosterone, Bioavailable: 70.2 ng/dL — ABNORMAL LOW (ref 130.5–681.7)
Testosterone, Free: 38.2 pg/mL — ABNORMAL LOW (ref 47.0–244.0)
Testosterone: 421 ng/dL (ref 250–827)

## 2015-09-03 LAB — CBC WITH DIFFERENTIAL/PLATELET
Basophils Absolute: 0 10*3/uL (ref 0.0–0.1)
Basophils Relative: 0.8 % (ref 0.0–3.0)
Eosinophils Absolute: 0.3 10*3/uL (ref 0.0–0.7)
Eosinophils Relative: 5.9 % — ABNORMAL HIGH (ref 0.0–5.0)
HCT: 40.7 % (ref 39.0–52.0)
Hemoglobin: 14.2 g/dL (ref 13.0–17.0)
Lymphocytes Relative: 27.3 % (ref 12.0–46.0)
Lymphs Abs: 1.5 10*3/uL (ref 0.7–4.0)
MCHC: 34.9 g/dL (ref 30.0–36.0)
MCV: 91.6 fl (ref 78.0–100.0)
Monocytes Absolute: 0.4 10*3/uL (ref 0.1–1.0)
Monocytes Relative: 8.2 % (ref 3.0–12.0)
Neutro Abs: 3.1 10*3/uL (ref 1.4–7.7)
Neutrophils Relative %: 57.8 % (ref 43.0–77.0)
Platelets: 235 10*3/uL (ref 150.0–400.0)
RBC: 4.44 Mil/uL (ref 4.22–5.81)
RDW: 13.5 % (ref 11.5–15.5)
WBC: 5.3 10*3/uL (ref 4.0–10.5)

## 2015-09-03 LAB — LIPID PANEL
Cholesterol: 200 mg/dL (ref 0–200)
HDL: 46.9 mg/dL (ref >39.00)
LDL Cholesterol: 116 mg/dL — ABNORMAL HIGH (ref 0–99)
NonHDL: 153.36
Total CHOL/HDL Ratio: 4
Triglycerides: 189 mg/dL — ABNORMAL HIGH (ref 0.0–149.0)
VLDL: 37.8 mg/dL (ref 0.0–40.0)

## 2015-09-03 LAB — TSH: TSH: 0.66 u[IU]/mL (ref 0.35–4.50)

## 2015-09-03 LAB — COMPREHENSIVE METABOLIC PANEL
ALT: 11 U/L (ref 0–53)
AST: 15 U/L (ref 0–37)
Albumin: 4.1 g/dL (ref 3.5–5.2)
Alkaline Phosphatase: 47 U/L (ref 39–117)
BUN: 21 mg/dL (ref 6–23)
CO2: 30 mEq/L (ref 19–32)
Calcium: 9 mg/dL (ref 8.4–10.5)
Chloride: 104 mEq/L (ref 96–112)
Creatinine, Ser: 1.09 mg/dL (ref 0.40–1.50)
GFR: 68.46 mL/min (ref >60.00)
Glucose, Bld: 99 mg/dL (ref 70–99)
Potassium: 4.9 mEq/L (ref 3.5–5.1)
Sodium: 140 mEq/L (ref 135–145)
Total Bilirubin: 0.9 mg/dL (ref 0.2–1.2)
Total Protein: 6.3 g/dL (ref 6.0–8.3)

## 2015-09-05 ENCOUNTER — Other Ambulatory Visit: Payer: Self-pay | Admitting: Internal Medicine

## 2015-09-05 ENCOUNTER — Encounter: Payer: Self-pay | Admitting: Internal Medicine

## 2015-09-17 ENCOUNTER — Encounter: Payer: Self-pay | Admitting: Internal Medicine

## 2015-09-17 ENCOUNTER — Ambulatory Visit (INDEPENDENT_AMBULATORY_CARE_PROVIDER_SITE_OTHER): Payer: PPO | Admitting: Internal Medicine

## 2015-09-17 VITALS — BP 128/64 | HR 59 | Temp 97.8°F | Resp 10 | Ht 75.0 in | Wt 194.5 lb

## 2015-09-17 DIAGNOSIS — E291 Testicular hypofunction: Secondary | ICD-10-CM

## 2015-09-17 DIAGNOSIS — R7989 Other specified abnormal findings of blood chemistry: Secondary | ICD-10-CM

## 2015-09-17 DIAGNOSIS — Z79899 Other long term (current) drug therapy: Secondary | ICD-10-CM

## 2015-09-17 MED ORDER — TESTOSTERONE CYPIONATE 200 MG/ML IM SOLN: INTRAMUSCULAR | Status: DC

## 2015-09-17 NOTE — Patient Instructions (Addendum)
You are doing well.  I advise you to continue the same dose of testosterone , because your total level is normal .  No medication changes today  today   I will see you in 6 months

## 2015-09-17 NOTE — Progress Notes (Signed)
Pre-visit discussion using our clinic review tool. No additional management support is needed unless otherwise documented below in the visit note.  

## 2015-09-17 NOTE — Progress Notes (Signed)
Subjective:  Patient ID: Theodore [name], male    DOB: 04/22/1929  Age: 82 y.o. MRN: [id number]  CC: The encounter diagnosis was Low serum testosterone level.  HPI Theodore [name] presents for follow up on testosterone deficiency managed with injections due to decreased QOL secondary to fatigue .  Has been taking testosterone for decades, initially found to be low duing treatment of depression.Last injection was July 5 .  Level was checked halfway between the biweekly injections.  Most recent testosterone level was normal , but his free testosterone level and bioavailale levels were very low Other labs were normal    Ome joint Stiffness in early morning.  Gone after 15-20 minutes  contineus to have excessive drainage worse at night despite flushing, etc.   Ran into an open cabinet door 10 days ago,  Hit head,  Was dizzy for 2 hours,  Had a gash on forehead.   Has been Exercising 5/week  FOR THE PAST MONTH  Walking 4 MILES AND KEEPS hr AT 103  FEELS GREAT       Lab Results  Component Value Date   TESTOSTERONE 421 09/02/2015     Outpatient Prescriptions Prior to Visit  Medication Sig Dispense Refill  . cholecalciferol (VITAMIN D) 1000 UNITS tablet Take 4,000 Units by mouth daily.     [name] co-enzyme Q-10 30 MG capsule Take 30 mg by mouth 3 (three) times daily.    . COD LIVER OIL PO Take by mouth.    . cyanocobalamin 500 MCG tablet Take 500 mcg by mouth daily.    . Flaxseed, Linseed, (FLAX SEEDS PO) Take by mouth.    . Hydrogen Peroxide 30 % SOLN by Does not apply route. 5 drops per day in water (food grade)    . Iodine, Kelp, 0.15 MG TABS Take 1 tablet by mouth daily.    [name] levothyroxine (SYNTHROID, LEVOTHROID) 100 MCG tablet Take 1 tablet (100 mcg total) by mouth daily before breakfast. 15 tablet 0  . magnesium oxide (MAG-OX) 400 MG tablet Take 400 mg by mouth daily.    . mometasone (ELOCON) 0.1 % cream Apply 1 application topically daily. To ear canal for itching 45 g 1  . OVER THE  COUNTER MEDICATION Tumeric daily    . OVER THE COUNTER MEDICATION Standard Process- Cardio Plus    . OVER THE COUNTER MEDICATION Standard Process- [place]    . OVER THE COUNTER MEDICATION Standard Process- Folic Acid    . OVER THE COUNTER MEDICATION Standard PRocess- Prosynbiotic    . Probiotic Product (PROBIOTIC ACIDOPHILUS) CAPS Take 1 capsule by mouth daily.    . vitamin E (VITAMIN E) 400 UNIT capsule Take 400 Units by mouth daily.    . meloxicam (MOBIC) 15 MG tablet Take 1 tablet by mouth daily.  3   No facility-administered medications prior to visit.    Review of Systems;  Patient denies headache, fevers, malaise, unintentional weight loss, skin rash, eye pain, sinus congestion and sinus pain, sore throat, dysphagia,  hemoptysis , cough, dyspnea, wheezing, chest pain, palpitations, orthopnea, edema, abdominal pain, nausea, melena, diarrhea, constipation, flank pain, dysuria, hematuria, urinary  Frequency, nocturia, numbness, tingling, seizures,  Focal weakness, Loss of consciousness,  Tremor, insomnia, depression, anxiety, and suicidal ideation.      Objective:  BP 128/64 mmHg  Pulse 59  Temp(Src) 97.8 F (36.6 C) (Oral)  Resp 10  Ht 6\' 3"  (1.905 m)  Wt 194 lb 8 oz (88.225 kg)  BMI 24.31  kg/m2  SpO2 98%  BP Readings from Last 3 Encounters:  09/17/15 128/64  03/30/15 136/68  02/03/15 128/72    Wt Readings from Last 3 Encounters:  09/17/15 194 lb 8 oz (88.225 kg)  03/30/15 199 lb 6.4 oz (90.447 kg)  02/03/15 198 lb 4 oz (89.926 kg)    General appearance: alert, cooperative and appears stated age Ears: normal TM's and external ear canals both ears Throat: lips, mucosa, and tongue normal; teeth and gums normal Neck: no adenopathy, no carotid bruit, supple, symmetrical, trachea midline and thyroid not enlarged, symmetric, no tenderness/mass/nodules Back: symmetric, no curvature. ROM normal. No CVA tenderness. Lungs: clear to auscultation bilaterally Heart: regular  rate and rhythm, S1, S2 normal, no murmur, click, rub or gallop Abdomen: soft, non-tender; bowel sounds normal; no masses,  no organomegaly Pulses: 2+ and symmetric Skin: Skin color, texture, turgor normal. No rashes or lesions Lymph nodes: Cervical, supraclavicular, and axillary nodes normal.  No results found for: HGBA1C  Lab Results  Component Value Date   CREATININE 1.09 09/02/2015   CREATININE 0.92 02/03/2015   CREATININE 1.18 10/05/2014    Lab Results  Component Value Date   WBC 5.3 09/02/2015   HGB 14.2 09/02/2015   HCT 40.7 09/02/2015   PLT 235.0 09/02/2015   GLUCOSE 99 09/02/2015   CHOL 200 09/02/2015   TRIG 189.0* 09/02/2015   HDL 46.90 09/02/2015   LDLCALC 116* 09/02/2015   ALT 11 09/02/2015   AST 15 09/02/2015   NA 140 09/02/2015   K 4.9 09/02/2015   CL 104 09/02/2015   CREATININE 1.09 09/02/2015   BUN 21 09/02/2015   CO2 30 09/02/2015   TSH 0.66 09/02/2015   PSA 3.65 07/01/2014     Assessment & Plan:   Problem List Items Addressed This Visit    Low serum testosterone level - Primary    He feels generally poor without testosterone supplementation.  Risks of long term testosterone supplementation discussed vs benefits. His 6 week suspension resulted in a decreased in quality of life,  But his total testosterone level is normal  And his cholesterol has improved. Since resuming cholesterol he feels significantly beter, has more endurance and his liver function and cholesterol are normal.  Continue current dose   Lab Results  Component Value Date   TESTOSTERONE 421 09/02/2015             I have discontinued Theodore [name]'s meloxicam. I am also having him maintain his COD LIVER OIL PO, cholecalciferol, vitamin E, cyanocobalamin, magnesium oxide, co-enzyme Q-10, (Flaxseed, Linseed, (FLAX SEEDS PO)), Iodine (Kelp), Hydrogen Peroxide, OVER THE COUNTER MEDICATION, OVER THE COUNTER MEDICATION, OVER THE COUNTER MEDICATION, OVER THE COUNTER MEDICATION, OVER THE  COUNTER MEDICATION, PROBIOTIC ACIDOPHILUS BIOBEADS, mometasone, levothyroxine, and testosterone cypionate.  Meds ordered this encounter  Medications  . testosterone cypionate (DEPOTESTOSTERONE CYPIONATE) 200 MG/ML injection    Sig: INJECT 0.25 MLS INTO THE MUSCLE EVERY 14 DAYS    Dispense:  10 mL    Refill:  0    Medications Discontinued During This Encounter  Medication Reason  . meloxicam (MOBIC) 15 MG tablet Patient Preference  . testosterone cypionate (DEPOTESTOSTERONE CYPIONATE) 200 MG/ML injection Reorder    Follow-up: Return in about 6 months (around 03/19/2016) for wellness.   [name], MD

## 2015-09-19 NOTE — Assessment & Plan Note (Signed)
He feels generally poor without testosterone supplementation.  Risks of long term testosterone supplementation discussed vs benefits. His 6 week suspension resulted in a decreased in quality of life,  But his total testosterone level is normal  And his cholesterol has improved. Since resuming cholesterol he feels significantly beter, has more endurance and his liver function and cholesterol are normal.  Continue current dose   Lab Results  Component Value Date   TESTOSTERONE 421 09/02/2015

## 2015-09-19 NOTE — Assessment & Plan Note (Signed)
He continues to request ongoing testosterone replacement for perceived  QOL  Benefits. CBC, PSA and liver enyzmes are all normal.  Lab Results  Component Value Date   ALT 11 09/02/2015   AST 15 09/02/2015   ALKPHOS 47 09/02/2015   BILITOT 0.9 09/02/2015   Lab Results  Component Value Date   WBC 5.3 09/02/2015   HGB 14.2 09/02/2015   HCT 40.7 09/02/2015   MCV 91.6 09/02/2015   PLT 235.0 09/02/2015   Lab Results  Component Value Date   CHOL 200 09/02/2015   HDL 46.90 09/02/2015   LDLCALC 116* 09/02/2015   TRIG 189.0* 09/02/2015   CHOLHDL 4 09/02/2015

## 2015-09-22 NOTE — Telephone Encounter (Signed)
Mailed unread message to patient.  

## 2015-11-16 ENCOUNTER — Telehealth: Payer: Self-pay | Admitting: *Deleted

## 2015-11-16 DIAGNOSIS — M5440 Lumbago with sciatica, unspecified side: Secondary | ICD-10-CM

## 2015-11-16 NOTE — Telephone Encounter (Signed)
Please give a time and date to have pt seen this week . He stated that the matter is urgent and he's in serious pain,pt is very frustrated and did not want to be seen in the office. He stated that he much rather have physical therapy, because he know what's wrong with his back .

## 2015-11-16 NOTE — Telephone Encounter (Signed)
I cant refer for a problem I have not treated,  He needs to make appt with me first

## 2015-11-16 NOTE — Telephone Encounter (Signed)
Please advise 

## 2015-11-16 NOTE — Telephone Encounter (Signed)
Please advise for referral, thanks 

## 2015-11-16 NOTE — Telephone Encounter (Signed)
Patient requested to have a referral to [facility] physical therapy for chronic ongoing back pain.  Pt contact [phone]

## 2015-11-17 NOTE — Telephone Encounter (Signed)
Tried to call and talk with patient and explain he at least needed an Xray before PCP could make referral to PT that we had to at least have an idea of whats going on since he has never been seen by PCP for this problem. Patient raised his voice stating he does not understand he called office 3 times yesterday left message on referral line. No one called him back, patient stated it is impossible to talk to you Juliann Pulse. "i do not like it, and my back is better today but I was in terrible pain and I knew physical therapy would help. Patient stated he was very un happy and to just cancel everything."

## 2015-11-17 NOTE — Telephone Encounter (Signed)
I drafted a letter that was my charted to patient this afternoon

## 2015-11-17 NOTE — Telephone Encounter (Signed)
I attempted to reach patient, spoke with him and he was not happy,I offered to schedule him for the visit on Thursday and he refused, stated that he is frustrated and wanted to know why Juliann Pulse didn't call him, I explained that she was working in the lab today and that I could have her call if needed.  He requested a call from [place].

## 2015-11-17 NOTE — Telephone Encounter (Signed)
You can use Thursday 12:00 since it was not used for hospital follow up (check with [facility])  He will need to have x rays done of lower back first.  I have ordered them so they can be done any time

## 2015-11-19 NOTE — Telephone Encounter (Signed)
Discussed with Tanya.  New process in [facility] drafted to original telephone note.  Tanya edited to Dr. Derrel Nip so patient notified via My Chart and printing and mailing today.

## 2015-11-19 NOTE — Telephone Encounter (Signed)
Letter was printed and sent via mychart and in the mail.

## 2015-12-13 ENCOUNTER — Telehealth: Payer: Self-pay | Admitting: *Deleted

## 2015-12-13 MED ORDER — LEVOTHYROXINE SODIUM 100 MCG PO TABS: 100.0000 ug | ORAL_TABLET | Freq: Every day | ORAL | 1 refills | Status: DC

## 2015-12-13 NOTE — Telephone Encounter (Signed)
Medication sent as requested.

## 2015-12-13 NOTE — Telephone Encounter (Signed)
Pt requested a Rx refill for levothyroxine  Ph[facility] mail order

## 2015-12-23 ENCOUNTER — Ambulatory Visit (INDEPENDENT_AMBULATORY_CARE_PROVIDER_SITE_OTHER): Payer: PPO | Admitting: Internal Medicine

## 2015-12-23 ENCOUNTER — Encounter: Payer: Self-pay | Admitting: Internal Medicine

## 2015-12-23 VITALS — BP 126/74 | HR 53 | Temp 97.8°F | Resp 12 | Ht 75.0 in | Wt 197.8 lb

## 2015-12-23 DIAGNOSIS — R0989 Other specified symptoms and signs involving the circulatory and respiratory systems: Secondary | ICD-10-CM | POA: Diagnosis not present

## 2015-12-23 DIAGNOSIS — M25562 Pain in left knee: Secondary | ICD-10-CM

## 2015-12-23 DIAGNOSIS — H608X3 Other otitis externa, bilateral: Secondary | ICD-10-CM

## 2015-12-23 DIAGNOSIS — R1314 Dysphagia, pharyngoesophageal phase: Secondary | ICD-10-CM

## 2015-12-23 MED ORDER — MELOXICAM 15 MG PO TABS: 15.0000 mg | ORAL_TABLET | Freq: Every day | ORAL | 2 refills | Status: DC

## 2015-12-23 NOTE — Patient Instructions (Addendum)
FOR YOUR KNEE:  TRY TAKING MELOXICAM 15 MG DAILY ,  AND ADD TYLENOL  UP TO 200 MG DAILY IF NEEDED FOR PAIN   IF NOT BETTER IN 2-3 WEEKS CALL FOR REFERRAL TO DR MILLER   Referral  To  Dr Kathyrn Sheriff  IS IN PROCESS , ALONG WITH A SWALLOW EVALUATION FOR THE DYSPHAGIA  Dysphagia Swallowing problems (dysphagia) occur when solids and liquids seem to stick in your throat on the way down to your stomach, or the food takes longer to get to the stomach. Other symptoms include regurgitating food, noises coming from the throat, chest discomfort with swallowing, and a feeling of fullness or the feeling of something being stuck in your throat when swallowing. When blockage in your throat is complete, it may be associated with drooling. CAUSES  Problems with swallowing may occur because of problems with the muscles. The food cannot be propelled in the usual manner into your stomach. You may have ulcers, scar tissue, or inflammation in the tube down which food travels from your mouth to your stomach (esophagus), which blocks food from passing normally into the stomach. Causes of inflammation include:  Acid reflux from your stomach into your esophagus.  Infection.  Radiation treatment for cancer.  Medicines taken without enough fluids to wash them down into your stomach. You may have nerve problems that prevent signals from being sent to the muscles of your esophagus to contract and move your food down to your stomach. Globus pharyngeus is a relatively common problem in which there is a sense of an obstruction or difficulty in swallowing, without any physical abnormalities of the swallowing passages being found. This problem usually improves over time with reassurance and testing to rule out other causes. DIAGNOSIS Dysphagia can be diagnosed and its cause can be determined by tests in which you swallow a white substance that helps illuminate the inside of your throat (contrast medium) while X-rays are taken.  Sometimes a flexible telescope that is inserted down your throat (endoscopy) to look at your esophagus and stomach is used. TREATMENT   If the dysphagia is caused by acid reflux or infection, medicines may be used.  If the dysphagia is caused by problems with your swallowing muscles, swallowing therapy may be used to help you strengthen your swallowing muscles.  If the dysphagia is caused by a blockage or mass, procedures to remove the blockage may be done. HOME CARE INSTRUCTIONS  Try to eat soft food that is easier to swallow and check your weight on a daily basis to be sure that it is not decreasing.  Be sure to drink liquids when sitting upright (not lying down). SEEK MEDICAL CARE IF:  You are losing weight because you are unable to swallow.  You are coughing when you drink liquids (aspiration).  You are coughing up partially digested food. SEEK IMMEDIATE MEDICAL CARE IF:  You are unable to swallow your own saliva .  You are having shortness of breath or a fever, or both.  You have a hoarse voice along with difficulty swallowing. MAKE SURE YOU:  Understand these instructions.  Will watch your condition.  Will get help right away if you are not doing well or get worse.   This information is not intended to replace advice given to you by your health care provider. Make sure you discuss any questions you have with your health care provider.   Document Released: 02/25/2000 Document Revised: 03/20/2014 Document Reviewed: 08/16/2012 Elsevier Interactive Patient Education [employer].

## 2015-12-23 NOTE — Progress Notes (Signed)
Subjective:  Patient ID: Theodore [name], male    DOB: 10-03-1929  Age: 82 y.o. MRN: [id number]  CC: The primary encounter diagnosis was Dysphagia, pharyngoesophageal phase. Diagnoses of Choking episode, Chronic eczematous otitis externa of both ears, and Anterior knee pain, left were also pertinent to this visit.  HPI Theodore [name] presents for evaluation of dysphagia . Patient has a longstanding  history of chronic uncontrolled nonallergic vasomotor rhinitis , with multiple ENT evaluations,  Who presents with a two month history of choking on food. Last episode occurred yesterday while eating a cracker made with flax seed, the episodes are always accompanied by coughing, but not regurgitation.    Two weeks ago he choked on a piece of meat.  The episode resulted in prolonged coughing so severe he developed a nosebleed. .  More lately he has noted episodes occurring lwith iquids as well as solids    2) persistent left knee pain.  Started after playing in a [employer]. History of patellectomy  1954.  History of  RIGHT KNEE REPLACEMENT 2003.[name]  Has been icing his  LEFT KNEE IT AND USING LINAMENT,  NO NSAIDS .  Pain is brought on by getting in and out of care,  And getting into and turning over in bed. he pain is described as sharp.   NO PAIN GOING UP OR DOWN STAIRS JUST WEAKNESS.  AGGRAVATED BY BACI BALL TOURNAMENT . Would like to see Earnestine Leys  3) Chronic otitis externa. WANTS A STEROID LOTION FOR EARS Stronger than mometasone but less $$ THAn CORDRAN (FLURANDRENOLIDE 0.05% )    Outpatient Medications Prior to Visit  Medication Sig Dispense Refill  . cholecalciferol (VITAMIN D) 1000 UNITS tablet Take 4,000 Units by mouth daily.     [name] co-enzyme Q-10 30 MG capsule Take 30 mg by mouth 3 (three) times daily.    . COD LIVER OIL PO Take by mouth.    . cyanocobalamin 500 MCG tablet Take 500 mcg by mouth daily.    . Iodine, Kelp, 0.15 MG TABS Take 1 tablet by mouth daily.    [name]  levothyroxine (SYNTHROID, LEVOTHROID) 100 MCG tablet Take 1 tablet (100 mcg total) by mouth daily before breakfast. 90 tablet 1  . magnesium oxide (MAG-OX) 400 MG tablet Take 400 mg by mouth daily.    [name] OVER THE COUNTER MEDICATION Tumeric daily    . OVER THE COUNTER MEDICATION Standard Process- Cardio Plus    . OVER THE COUNTER MEDICATION Standard Process- [place]    . OVER THE COUNTER MEDICATION Standard Process- Folic Acid    . OVER THE COUNTER MEDICATION Standard PRocess- Prosynbiotic    . testosterone cypionate (DEPOTESTOSTERONE CYPIONATE) 200 MG/ML injection INJECT 0.25 MLS INTO THE MUSCLE EVERY 14 DAYS 10 mL 0  . vitamin E (VITAMIN E) 400 UNIT capsule Take 400 Units by mouth daily.    . Flaxseed, Linseed, (FLAX SEEDS PO) Take by mouth.    . Hydrogen Peroxide 30 % SOLN by Does not apply route. 5 drops per day in water (food grade)    . mometasone (ELOCON) 0.1 % cream Apply 1 application topically daily. To ear canal for itching (Patient not taking: Reported on 12/23/2015) 45 g 1  . Probiotic Product (PROBIOTIC ACIDOPHILUS) CAPS Take 1 capsule by mouth daily.     No facility-administered medications prior to visit.     Review of Systems;  Patient denies headache, fevers, malaise, unintentional weight loss, skin rash, eye pain, sinus congestion  and sinus pain, sore throat,   hemoptysis , , dyspnea, wheezing, chest pain, palpitations, orthopnea, edema, abdominal pain, nausea, melena, diarrhea, constipation, flank pain, dysuria, hematuria, urinary  Frequency, nocturia, numbness, tingling, seizures,  Focal weakness, Loss of consciousness,  Tremor, insomnia, depression, anxiety, and suicidal ideation.      Objective:  BP 126/74   Pulse (!) 53   Temp 97.8 F (36.6 C) (Oral)   Resp 12   Ht 6\' 3"  (1.905 m)   Wt 197 lb 12 oz (89.7 kg)   SpO2 96%   BMI 24.72 kg/m   BP Readings from Last 3 Encounters:  12/23/15 126/74  09/17/15 128/64  03/30/15 136/68    Wt Readings from Last 3  Encounters:  12/23/15 197 lb 12 oz (89.7 kg)  09/17/15 194 lb 8 oz (88.2 kg)  03/30/15 199 lb 6.4 oz (90.4 kg)    General appearance: alert, cooperative and appears stated age Ears: normal TM's and external ear canals both ears Throat: lips, mucosa, and tongue normal; teeth and gums normal Neck: no adenopathy, no carotid bruit, supple, symmetrical, trachea midline and thyroid not enlarged, symmetric, no tenderness/mass/nodules Back: symmetric, no curvature. ROM normal. No CVA tenderness. Lungs: clear to auscultation bilaterally Heart: regular rate and rhythm, S1, S2 normal, no murmur, click, rub or gallop Abdomen: soft, non-tender; bowel sounds normal; no masses,  no organomegaly Pulses: 2+ and symmetric Skin: Skin color, texture, turgor normal. No rashes or lesions Lymph nodes: Cervical, supraclavicular, and axillary nodes normal.  No results found for: HGBA1C  Lab Results  Component Value Date   CREATININE 1.09 09/02/2015   CREATININE 0.92 02/03/2015   CREATININE 1.18 10/05/2014    Lab Results  Component Value Date   WBC 5.3 09/02/2015   HGB 14.2 09/02/2015   HCT 40.7 09/02/2015   PLT 235.0 09/02/2015   GLUCOSE 99 09/02/2015   CHOL 200 09/02/2015   TRIG 189.0 (H) 09/02/2015   HDL 46.90 09/02/2015   LDLCALC 116 (H) 09/02/2015   ALT 11 09/02/2015   AST 15 09/02/2015   NA 140 09/02/2015   K 4.9 09/02/2015   CL 104 09/02/2015   CREATININE 1.09 09/02/2015   BUN 21 09/02/2015   CO2 30 09/02/2015   TSH 0.66 09/02/2015   PSA 3.65 07/01/2014    Assessment & Plan:   Problem List Items Addressed This Visit    Choking episode    Recurrent, initially with solids,  Now with liquids.  Modified barium swallow ordered followed by ENT vs GI evaluation       Otitis externa, eczematoid    fluocinolide ointment prescribed.  Mometasone was not effective, and Cordran (flurandrenolide) was  too expensive.       Anterior knee pain, left    Persistent since recnet [employer]. Prior patellectomy. 2 weeks trial of meloxicam and tylenol.  If No improvement, referral to Earnestine Leys. Requested.        Other Visit Diagnoses    Dysphagia, pharyngoesophageal phase    -  Primary   Relevant Orders   Ambulatory referral to ENT   SLP clinical swallow evaluation     A total of 25 minutes of face to face time was spent with patient more than half of which was spent in counselling about the above mentioned conditions  and coordination of care  I am having Mr. Neer start on meloxicam and fluocinonide ointment. I am also having him maintain his COD LIVER OIL PO, cholecalciferol, vitamin E, cyanocobalamin,  magnesium oxide, co-enzyme Q-10, (Flaxseed, Linseed, (FLAX SEEDS PO)), Iodine (Kelp), Hydrogen Peroxide, OVER THE COUNTER MEDICATION, OVER THE COUNTER MEDICATION, OVER THE COUNTER MEDICATION, OVER THE COUNTER MEDICATION, OVER THE COUNTER MEDICATION, PROBIOTIC ACIDOPHILUS BIOBEADS, mometasone, testosterone cypionate, and levothyroxine.  Meds ordered this encounter  Medications  . meloxicam (MOBIC) 15 MG tablet    Sig: Take 1 tablet (15 mg total) by mouth daily.    Dispense:  30 tablet    Refill:  2  . fluocinonide ointment (LIDEX) 0.05 %    Sig: Apply 1 application topically 2 (two) times daily. To external ear canal for one week as needed    Dispense:  30 g    Refill:  1    There are no discontinued medications.  Follow-up: No Follow-up on file.   [name], MD

## 2015-12-23 NOTE — Progress Notes (Signed)
Pre-visit discussion using our clinic review tool. No additional management support is needed unless otherwise documented below in the visit note.  

## 2015-12-24 ENCOUNTER — Telehealth: Payer: Self-pay | Admitting: Internal Medicine

## 2015-12-24 ENCOUNTER — Other Ambulatory Visit: Payer: Self-pay | Admitting: Internal Medicine

## 2015-12-24 DIAGNOSIS — R1314 Dysphagia, pharyngoesophageal phase: Secondary | ICD-10-CM

## 2015-12-24 DIAGNOSIS — R1310 Dysphagia, unspecified: Secondary | ICD-10-CM

## 2015-12-24 NOTE — Telephone Encounter (Signed)
Please advise, thanks.

## 2015-12-24 NOTE — Telephone Encounter (Signed)
Order corrected to [facility]

## 2015-12-24 NOTE — Telephone Encounter (Signed)
Theodore [name] [address] [place] scheduling called about the order for SLP should entered in as a Modified Barium Swallow. Pt is scheduled for 01/12/16 @ 1pm . Pt is aware. Just order needs to be corrected. Thank you!

## 2015-12-24 NOTE — Telephone Encounter (Signed)
Noted thanks °

## 2015-12-26 ENCOUNTER — Other Ambulatory Visit: Payer: Self-pay | Admitting: Internal Medicine

## 2015-12-26 DIAGNOSIS — H60549 Acute eczematoid otitis externa, unspecified ear: Secondary | ICD-10-CM | POA: Insufficient documentation

## 2015-12-26 DIAGNOSIS — M25562 Pain in left knee: Secondary | ICD-10-CM | POA: Insufficient documentation

## 2015-12-26 MED ORDER — FLUOCINONIDE 0.05 % EX OINT: 1.0000 "application " | TOPICAL_OINTMENT | Freq: Two times a day (BID) | CUTANEOUS | 1 refills | Status: DC

## 2015-12-26 NOTE — Assessment & Plan Note (Signed)
fluocinolide ointment prescribed.  Mometasone was not effective, and Cordran (flurandrenolide) was  too expensive.

## 2015-12-26 NOTE — Assessment & Plan Note (Signed)
Recurrent, initially with solids,  Now with liquids.  Modified barium swallow ordered followed by ENT vs GI evaluation

## 2015-12-26 NOTE — Assessment & Plan Note (Addendum)
Persistent since recnet [employer]. Prior patellectomy. 2 weeks trial of meloxicam and tylenol.  If No improvement, referral to Earnestine Leys. Requested.

## 2015-12-27 ENCOUNTER — Encounter: Payer: Self-pay | Admitting: Internal Medicine

## 2016-01-05 ENCOUNTER — Telehealth: Payer: Self-pay | Admitting: *Deleted

## 2016-01-12 ENCOUNTER — Ambulatory Visit
Admission: RE | Admit: 2016-01-12 | Discharge: 2016-01-12 | Disposition: A | Discharge status: Home / Self Care | Payer: PPO | Source: Ambulatory Visit | Attending: Internal Medicine | Admitting: Internal Medicine

## 2016-01-12 DIAGNOSIS — R1310 Dysphagia, unspecified: Secondary | ICD-10-CM | POA: Insufficient documentation

## 2016-01-12 DIAGNOSIS — M1712 Unilateral primary osteoarthritis, left knee: Secondary | ICD-10-CM | POA: Diagnosis not present

## 2016-01-12 DIAGNOSIS — R1312 Dysphagia, oropharyngeal phase: Secondary | ICD-10-CM

## 2016-01-12 DIAGNOSIS — Z96651 Presence of right artificial knee joint: Secondary | ICD-10-CM | POA: Diagnosis not present

## 2016-01-12 NOTE — Therapy (Signed)
[place], [place], [address] Phone: [phone]   Fax:     Modified Barium Swallow  Patient Details  Name: Theodore [name] MRN: [id number] Date of Birth: [birth year] No Data Recorded  Encounter Date: 01/12/2016      End of Session - 01/12/16 1442    Visit Number 1   Date for SLP Re-Evaluation 01/12/16   SLP Start Time 70   SLP Stop Time  1400   SLP Time Calculation (min) 48 min   Activity Tolerance Patient tolerated treatment well      Past Medical History:  Diagnosis Date  . Arthritis   . Cancer [facility]) 2002   melanoma right ear  . Colon polyp   . Depression   . Thyroid disease    hypothyroid  . Tremors of nervous system    Benign    Past Surgical History:  Procedure Laterality Date  . JOINT REPLACEMENT Right 2003   knee  . MELANOMA EXCISION Right    ear. Followed by Dr. Nehemiah Massed    There were no vitals filed for this visit.   Subjective: Patient behavior: (alertness, ability to follow instructions, etc.): Patient able to follow directions  Chief complaint: Choking and coughing with food and liquid. The patient reported choking on rare meat, almonds, and water with flax seeds.  The patient was followed at [facility] in 2009.  At that time, SLP reported: "Notable videolaryngostroboscopic findings today revealed significant rhinitis as well as thick mucus along the vocal folds, very mild laryngeal edema, and MTD, as confirmed by Dr. Patrice Paradise." and an MBS which showed moderate pharyngeal residue and 1 episode of flash/transient laryngeal penetration.    Objective:  Radiological Procedure: A videoflouroscopic evaluation of oral-preparatory, reflex initiation, and pharyngeal phases of the swallow was performed; as well as a screening of the upper esophageal phase.  I. POSTURE: Upright in MBS chair  II. VIEW: Lateral  III. COMPENSATORY STRATEGIES: Dry swallow and liquid wash  helpful in clearing pharyngeal residue  IV. BOLUSES ADMINISTERED:   Thin Liquid: 2 small sips, 5 rapid, consecutive gulps (patient asked to "drink like you normally do")   Nectar-thick Liquid: 1 moderate size bolus    Puree: 2 teaspoon presentations   Mechanical Soft: 1/4 graham cracker in applesauce  V. RESULTS OF EVALUATION: A. ORAL PREPARATORY PHASE: (The lips, tongue, and velum are observed for strength and coordination)       **Overall Severity Rating: Within normal limits  B. SWALLOW INITIATION/REFLEX: (The reflex is normal if "triggered" by the time the bolus reached the base of the tongue)  **Overall Severity Rating: Mild, triggers at valleculae or while falling from the valleculae to the pyriform sinuses  C. PHARYNGEAL PHASE: (Pharyngeal function is normal if the bolus shows rapid, smooth, and continuous transit through the pharynx and there is no pharyngeal residue after the swallow)  **Overall Severity Rating: Moderate; decreased anterior hyoid movement, decreased tongue base retraction, and incomplete epiglottic inversion  D. LARYNGEAL PENETRATION: (Material entering into the laryngeal inlet/vestibule but not aspirated) During rapid consecutive gulps of thin liquid; trace laryngeal vestibule residue after completion of swallowing  E. ASPIRATION: Trace X1 during rapid, consecutive gulping of thin; aspiration elicited throat clearing  F. ESOPHAGEAL PHASE: (Screening of the upper esophagus) In the cervical esophagus there is a finger-like protrusion along the posterior wall during swallow (does not impede flow of boluses) consistent with prominent cricopharyngeus.    ASSESSMENT: 82 year old  man, with increasing episodes of choking on food and liquids, is presenting with mild-moderate oropharyngeal dysphagia characterized by delayed pharyngeal swallow initiation, decreased anterior hyoid movement, decreased tongue base retraction, and incomplete epiglottic inversion.  There is  moderate vallecular and pyriform sinus residue across consistencies.  This clears with second swallow or liquid chaser.  The patient does not spontaneously swallow, indicating decreased sensation.  There was no observed laryngeal penetration/aspiration of solids, nectar-thick liquid, or small sips of thin liquid.  During rapid, consecutive gulping (5 swallows) of thin liquid there is deep laryngeal penetration with trace aspiration (X1) and trace laryngeal vestibule residue.  The patient cleared his throat after the trace aspiration.  The patient was advised to take single sips only, never gulp his liquids.  This patient had an MBS at [facility] in 2009, the patient has had remarkably little change in 8 years considering his age.  The patient was advised to monitor and analyze episodes of choking and modify foods or eating patterns to prevent choking events (eg, moisten dry foods as well as chew carefully).  The patient reports that he will see Dr. Pryor Ochoa next week.  PLAN/RECOMMENDATIONS:   A. Diet: Regular; moisten, soften, chop as needed to avoid choking   B. Swallowing Precautions: Single sips of liquid- no gulping   C. Recommended consultation to: ENT, patient states he has an appointment with Dr. Pryor Ochoa next week   D. Therapy recommendations: none at this time   E. Results and recommendations were discussed with the patient at the time of the study.  The full report will be routed to his primary care physician, Dr. Derrel Nip and to Dr. Pryor Ochoa with the patient's permission.    Oropharyngeal dysphagia - Plan: DG OP Swallowing Func-Medicare/Speech Path, DG OP Swallowing Func-Medicare/Speech Path      G-Codes - 2016/01/22 1443    Functional Assessment Tool Used MBS, clinical judgment   Functional Limitations Swallowing   Swallow Current Status (O5366) At least 40 percent but less than 60 percent impaired, limited or restricted   Swallow Goal Status (Y4034) At least 40 percent but less than 60 percent  impaired, limited or restricted   Swallow Discharge Status [phone]) At least 40 percent but less than 60 percent impaired, limited or restricted          Problem List Patient Active Problem List   Diagnosis Date Noted  . Otitis externa, eczematoid 12/26/2015  . Anterior knee pain, left 12/26/2015  . Choking episode 12/23/2015  . History of anemia 07/09/2014  . Hypothyroidism 07/04/2014  . Hyperlipidemia LDL goal <100 07/04/2014  . Nonallergic vasomotor rhinitis 07/01/2014  . Long-term use of high-risk medication 01/16/2014  . Vitamin D deficiency 07/31/2013  . Fatigue 07/31/2013  . Depression with anxiety 04/28/2013  . Low serum testosterone level 04/28/2013   Leroy Sea, MS/CCC- SLP  Lou Miner 2016/01/22, 2:43 PM  [facility] [place], [place], [address] Phone: [phone]   Fax:     Name: Theodore [name] MRN: [id number] Date of Birth: 1929-03-28

## 2016-01-14 ENCOUNTER — Telehealth: Payer: Self-pay | Admitting: Internal Medicine

## 2016-01-14 DIAGNOSIS — R1312 Dysphagia, oropharyngeal phase: Secondary | ICD-10-CM

## 2016-01-14 NOTE — Telephone Encounter (Signed)
Referral for EGD in place,  Please notify patient that we  Are ordering this per ENT request

## 2016-01-14 NOTE — Telephone Encounter (Signed)
Christy from [facility] stating that they had Theodore [name] scheduled for Monday. However they are going to reschedule his appointment. They have reviewed his barium swallow but would like for him to have an endoscopy prior to them seeing him. Please advise.

## 2016-01-14 NOTE — Telephone Encounter (Signed)
Line busy could not leave message.

## 2016-01-17 NOTE — Telephone Encounter (Signed)
Patient notified and voiced understanding.

## 2016-01-17 NOTE — Telephone Encounter (Signed)
Pt called returning your call. Thank you!  Call pt @ [phone]

## 2016-01-18 ENCOUNTER — Other Ambulatory Visit: Payer: Self-pay | Admitting: Gastroenterology

## 2016-01-18 DIAGNOSIS — R1310 Dysphagia, unspecified: Secondary | ICD-10-CM | POA: Diagnosis not present

## 2016-01-24 DIAGNOSIS — Z1283 Encounter for screening for malignant neoplasm of skin: Secondary | ICD-10-CM | POA: Diagnosis not present

## 2016-01-24 DIAGNOSIS — D229 Melanocytic nevi, unspecified: Secondary | ICD-10-CM | POA: Diagnosis not present

## 2016-01-24 DIAGNOSIS — L821 Other seborrheic keratosis: Secondary | ICD-10-CM | POA: Diagnosis not present

## 2016-01-24 DIAGNOSIS — L820 Inflamed seborrheic keratosis: Secondary | ICD-10-CM | POA: Diagnosis not present

## 2016-01-24 DIAGNOSIS — D1800 Hemangioma unspecified site: Secondary | ICD-10-CM | POA: Diagnosis not present

## 2016-01-24 DIAGNOSIS — L219 Seborrheic dermatitis, unspecified: Secondary | ICD-10-CM | POA: Diagnosis not present

## 2016-01-24 DIAGNOSIS — B351 Tinea unguium: Secondary | ICD-10-CM | POA: Diagnosis not present

## 2016-01-24 DIAGNOSIS — L814 Other melanin hyperpigmentation: Secondary | ICD-10-CM | POA: Diagnosis not present

## 2016-01-24 DIAGNOSIS — L905 Scar conditions and fibrosis of skin: Secondary | ICD-10-CM | POA: Diagnosis not present

## 2016-01-24 DIAGNOSIS — Z85820 Personal history of malignant melanoma of skin: Secondary | ICD-10-CM | POA: Diagnosis not present

## 2016-01-26 ENCOUNTER — Ambulatory Visit
Admission: RE | Admit: 2016-01-26 | Discharge: 2016-01-26 | Disposition: A | Discharge status: Home / Self Care | Payer: PPO | Source: Ambulatory Visit | Attending: Gastroenterology | Admitting: Gastroenterology

## 2016-01-26 DIAGNOSIS — R1310 Dysphagia, unspecified: Secondary | ICD-10-CM | POA: Insufficient documentation

## 2016-01-31 ENCOUNTER — Telehealth: Payer: Self-pay | Admitting: Internal Medicine

## 2016-01-31 MED ORDER — ALPRAZOLAM 0.25 MG PO TABS: 0.2500 mg | ORAL_TABLET | Freq: Every day | ORAL | 1 refills | Status: DC | PRN

## 2016-01-31 NOTE — Telephone Encounter (Signed)
Refilled for two months.  Will need OV in January if more refills needed

## 2016-01-31 NOTE — Telephone Encounter (Signed)
Script faxed.

## 2016-01-31 NOTE — Telephone Encounter (Signed)
Patient has not had this medication since 01/27/15 ok to fill last OV 12/23/15?

## 2016-01-31 NOTE — Telephone Encounter (Signed)
Pt called requesting a refill on Xanax. Please advise, thank you!  Pharmacy - [id number] - [place], [place]  Call pt @ [phone]

## 2016-03-21 DIAGNOSIS — M1712 Unilateral primary osteoarthritis, left knee: Secondary | ICD-10-CM | POA: Diagnosis not present

## 2016-03-27 DIAGNOSIS — H2513 Age-related nuclear cataract, bilateral: Secondary | ICD-10-CM | POA: Diagnosis not present

## 2016-03-29 ENCOUNTER — Ambulatory Visit: Payer: PPO

## 2016-04-04 ENCOUNTER — Ambulatory Visit: Payer: PPO

## 2016-04-14 ENCOUNTER — Ambulatory Visit (INDEPENDENT_AMBULATORY_CARE_PROVIDER_SITE_OTHER): Payer: PPO

## 2016-04-14 VITALS — BP 120/70 | HR 57 | Temp 97.5°F | Resp 14 | Ht 74.0 in | Wt 186.8 lb

## 2016-04-14 DIAGNOSIS — Z0000 Encounter for general adult medical examination without abnormal findings: Secondary | ICD-10-CM | POA: Diagnosis not present

## 2016-04-14 NOTE — Progress Notes (Signed)
Subjective:   Theodore [name] is a 83 y.o. male who presents for Medicare Annual/Subsequent preventive examination.  Review of Systems:  No ROS.  Medicare Wellness Visit.  Cardiac Risk Factors include: advanced age (>69men, >102 women);male gender     Objective:    Vitals: BP 120/70 (BP Location: Left Arm, Patient Position: Sitting, Cuff Size: Normal)   Pulse (!) 57   Temp 97.5 F (36.4 C) (Oral)   Resp 14   Ht 6\' 2"  (1.88 m)   Wt 186 lb 12.8 oz (84.7 kg)   SpO2 97%   BMI 23.98 kg/m   Body mass index is 23.98 kg/m.  Tobacco History  Smoking Status  . Never Smoker  Smokeless Tobacco  . Never Used     Counseling given: Not Answered   Past Medical History:  Diagnosis Date  . Arthritis   . Cancer [facility]) 2002   melanoma right ear  . Colon polyp   . Depression   . Thyroid disease    hypothyroid  . Tremors of nervous system    Benign   Past Surgical History:  Procedure Laterality Date  . JOINT REPLACEMENT Right 2003   knee  . MELANOMA EXCISION Right    ear. Followed by Dr. Nehemiah Massed   Family History  Problem Relation Age of Onset  . Hypertension Mother   . Heart disease Mother     CHF  . Heart disease Father   . Cancer Sister     melanoma   History  Sexual Activity  . Sexual activity: Not Currently    Outpatient Encounter Prescriptions as of 04/14/2016  Medication Sig  . ALPRAZolam (XANAX) 0.25 MG tablet Take 1 tablet (0.25 mg total) by mouth daily as needed for anxiety.  . cholecalciferol (VITAMIN D) 1000 UNITS tablet Take 4,000 Units by mouth daily.   [name] co-enzyme Q-10 30 MG capsule Take 30 mg by mouth 3 (three) times daily.  . COD LIVER OIL PO Take by mouth.  . cyanocobalamin 500 MCG tablet Take 500 mcg by mouth daily.  . Flaxseed, Linseed, (FLAX SEEDS PO) Take by mouth.  . fluocinonide ointment (LIDEX) 1.24 % Apply 1 application topically 2 (two) times daily. To external ear canal for one week as needed  . Hydrogen Peroxide 30 % SOLN by Does not  apply route. 5 drops per day in water (food grade)  . Iodine, Kelp, 0.15 MG TABS Take 1 tablet by mouth daily.  [name] levothyroxine (SYNTHROID, LEVOTHROID) 100 MCG tablet TAKE 1 TABLET (100 MCG TOTAL) BY MOUTH DAILY BEFORE BREAKFAST.  . magnesium oxide (MAG-OX) 400 MG tablet Take 400 mg by mouth daily.  . mometasone (ELOCON) 0.1 % cream Apply 1 application topically daily. To ear canal for itching  . OVER THE COUNTER MEDICATION Tumeric daily  . OVER THE COUNTER MEDICATION Standard Process- Cardio Plus  . OVER THE COUNTER MEDICATION Standard Process- [place]  . OVER THE COUNTER MEDICATION Standard Process- Folic Acid  . OVER THE COUNTER MEDICATION Standard PRocess- Prosynbiotic  . Probiotic Product (PROBIOTIC ACIDOPHILUS) CAPS Take 1 capsule by mouth daily.  [name] testosterone cypionate (DEPOTESTOSTERONE CYPIONATE) 200 MG/ML injection INJECT 0.25 MLS INTO THE MUSCLE EVERY 14 DAYS  . vitamin E (VITAMIN E) 400 UNIT capsule Take 400 Units by mouth daily.  . [DISCONTINUED] levothyroxine (SYNTHROID, LEVOTHROID) 100 MCG tablet Take 1 tablet (100 mcg total) by mouth daily before breakfast.  . meloxicam (MOBIC) 15 MG tablet Take 1 tablet (15 mg total) by mouth daily. (Patient  not taking: Reported on 04/14/2016)   No facility-administered encounter medications on file as of 04/14/2016.     Activities of Daily Living In your present state of health, do you have any difficulty performing the following activities: 04/14/2016  Hearing? Y  Vision? N  Difficulty concentrating or making decisions? Y  Walking or climbing stairs? Y  Dressing or bathing? N  Doing errands, shopping? N  Preparing Food and eating ? N  Using the Toilet? N  In the past six months, have you accidently leaked urine? N  Do you have problems with loss of bowel control? N  Managing your Medications? N  Managing your Finances? N  Housekeeping or managing your Housekeeping? N  Some recent data might be hidden    Patient Care Team: [name], MD as PCP - General (Internal Medicine)   Assessment:    This is a routine wellness examination for Theodore [name]. The goal of the wellness visit is to assist the patient how to close the gaps in care and create a preventative care plan for the patient.   Osteoporosis risk reviewed.  Medications reviewed; taking without issues or barriers.  Safety issues reviewed; smoke detectors in the home. No firearms in the home. Wears seatbelts when driving or riding with others. No violence in the home.  No identified risk were noted; The patient was oriented x 3; appropriate in dress and manner and no objective failures at ADL's or IADL's.   BMI; discussed the importance of a healthy diet, water intake and exercise. Educational material provided.  Health maintenance gaps; closed.  Patient Concerns: None at this time. Follow up with PCP as needed.  Exercise Activities and Dietary recommendations Current Exercise Habits: Structured exercise class, Type of exercise: calisthenics;stretching (Plank), Time (Minutes): 30, Frequency (Times/Week): 3, Weekly Exercise (Minutes/Week): 90, Intensity: Intense  Goals    . Healthy Lifestyle          Yoga for exercise Increase water intake, stay hydrated Make healthy food choices when eating, choose lean meats, fruits and vegetables      Fall Risk Fall Risk  04/14/2016 03/30/2015  Falls in the past year? Yes No  Number falls in past yr: 1 -  Injury with Fall? No -  Follow up Falls prevention discussed -   Depression Screen PHQ 2/9 Scores 04/14/2016 03/30/2015  PHQ - 2 Score 0 0    Cognitive Function MMSE - Mini Mental State Exam 03/30/2015  Orientation to time 5  Orientation to Place 5  Registration 3  Attention/ Calculation 5  Recall 3  Language- name 2 objects 2  Language- repeat 1  Language- follow 3 step command 3  Language- read & follow direction 1  Write a sentence 0  Write a sentence-comments Some difficulty. Tremors.  Copy  design 1  Total score 29     6CIT Screen 04/14/2016  What Year? 0 points  What month? 0 points  What time? 0 points  Count back from 20 0 points  Months in reverse 0 points  Repeat phrase 0 points  Total Score 0    Immunization History  Administered Date(s) Administered  . Influenza,inj,Quad PF,36+ Mos 01/14/2014  . Influenza-Unspecified 12/11/2012  . Pneumococcal Conjugate-13 01/14/2014  . Pneumococcal Polysaccharide-23 04/28/2012  . Tdap 08/19/2014  . Zoster 04/28/2010   Screening Tests Health Maintenance  Topic Date Due  . INFLUENZA VACCINE  01/10/2018 (Originally 10/12/2015)  . TETANUS/TDAP  08/18/2024  . ZOSTAVAX  Completed  . PNA vac Low  Risk Adult  Completed      Plan:    End of life planning; Advance aging; Advanced directives discussed. Copy of current HCPOA/Living Will requested.  Medicare Attestation I have personally reviewed: The patient's medical and social history Their use of alcohol, tobacco or illicit drugs Their current medications and supplements The patient's functional ability including ADLs,fall risks, home safety risks, cognitive, and hearing and visual impairment Diet and physical activities Evidence for depression   The patient's weight, height, BMI, and visual acuity have been recorded in the chart.  I have made referrals and provided education to the patient based on review of the above and I have provided the patient with a written personalized care plan for preventive services.    During the course of the visit the patient was educated and counseled about the following appropriate screening and preventive services:   Vaccines to include Pneumoccal, Influenza, Hepatitis B, Td, Zostavax, HCV  Electrocardiogram  Cardiovascular Disease  Colorectal cancer screening  Diabetes screening  Prostate Cancer Screening  Glaucoma screening  Nutrition counseling   Smoking cessation counseling  Patient Instructions (the written plan) was  given to the patient.    Varney Biles, LPN  0/03/270

## 2016-04-14 NOTE — Patient Instructions (Addendum)
  [name] , Thank you for taking time to come for your Medicare Wellness Visit. I appreciate your ongoing commitment to your health goals. Please review the following plan we discussed and let me know if I can assist you in the future.   Follow up with Dr. Derrel Nip as needed.  These are the goals we discussed: Goals    . Healthy Lifestyle          Yoga for exercise Increase water intake, stay hydrated Make healthy food choices when eating, choose lean meats, fruits and vegetables       This is a list of the screening recommended for you and due dates:  Health Maintenance  Topic Date Due  . Flu Shot  01/10/2018*  . Tetanus Vaccine  08/18/2024  . Shingles Vaccine  Completed  . Pneumonia vaccines  Completed  *Topic was postponed. The date shown is not the original due date.

## 2016-04-19 NOTE — Progress Notes (Signed)
  I have reviewed the above information and agree with above.   Roisin Mones, MD 

## 2016-04-27 ENCOUNTER — Telehealth: Payer: Self-pay | Admitting: Internal Medicine

## 2016-04-27 ENCOUNTER — Encounter: Payer: Self-pay | Admitting: Internal Medicine

## 2016-04-27 ENCOUNTER — Ambulatory Visit (INDEPENDENT_AMBULATORY_CARE_PROVIDER_SITE_OTHER): Payer: PPO | Admitting: Internal Medicine

## 2016-04-27 ENCOUNTER — Ambulatory Visit (INDEPENDENT_AMBULATORY_CARE_PROVIDER_SITE_OTHER)
Admission: RE | Admit: 2016-04-27 | Discharge: 2016-04-27 | Disposition: A | Discharge status: Home / Self Care | Payer: PPO | Source: Ambulatory Visit | Attending: Internal Medicine | Admitting: Internal Medicine

## 2016-04-27 VITALS — BP 148/72 | HR 53 | Temp 97.7°F | Ht 74.0 in | Wt 185.6 lb

## 2016-04-27 DIAGNOSIS — I499 Cardiac arrhythmia, unspecified: Secondary | ICD-10-CM

## 2016-04-27 DIAGNOSIS — R0602 Shortness of breath: Secondary | ICD-10-CM | POA: Diagnosis not present

## 2016-04-27 DIAGNOSIS — F418 Other specified anxiety disorders: Secondary | ICD-10-CM | POA: Diagnosis not present

## 2016-04-27 DIAGNOSIS — R0600 Dyspnea, unspecified: Secondary | ICD-10-CM | POA: Diagnosis not present

## 2016-04-27 DIAGNOSIS — E039 Hypothyroidism, unspecified: Secondary | ICD-10-CM | POA: Diagnosis not present

## 2016-04-27 DIAGNOSIS — Z862 Personal history of diseases of the blood and blood-forming organs and certain disorders involving the immune mechanism: Secondary | ICD-10-CM | POA: Diagnosis not present

## 2016-04-27 LAB — CBC WITH DIFFERENTIAL/PLATELET
Basophils Absolute: 0 10*3/uL (ref 0.0–0.1)
Basophils Relative: 0.6 % (ref 0.0–3.0)
Eosinophils Absolute: 0.2 10*3/uL (ref 0.0–0.7)
Eosinophils Relative: 3.7 % (ref 0.0–5.0)
HCT: 45.9 % (ref 39.0–52.0)
Hemoglobin: 16 g/dL (ref 13.0–17.0)
Lymphocytes Relative: 23.8 % (ref 12.0–46.0)
Lymphs Abs: 1.2 10*3/uL (ref 0.7–4.0)
MCHC: 34.9 g/dL (ref 30.0–36.0)
MCV: 93.7 fl (ref 78.0–100.0)
Monocytes Absolute: 0.5 10*3/uL (ref 0.1–1.0)
Monocytes Relative: 9.3 % (ref 3.0–12.0)
Neutro Abs: 3.1 10*3/uL (ref 1.4–7.7)
Neutrophils Relative %: 62.6 % (ref 43.0–77.0)
Platelets: 243 10*3/uL (ref 150.0–400.0)
RBC: 4.9 Mil/uL (ref 4.22–5.81)
RDW: 13.7 % (ref 11.5–15.5)
WBC: 5 10*3/uL (ref 4.0–10.5)

## 2016-04-27 LAB — COMPREHENSIVE METABOLIC PANEL
ALT: 12 U/L (ref 0–53)
AST: 17 U/L (ref 0–37)
Albumin: 4.4 g/dL (ref 3.5–5.2)
Alkaline Phosphatase: 40 U/L (ref 39–117)
BUN: 16 mg/dL (ref 6–23)
CO2: 34 mEq/L — ABNORMAL HIGH (ref 19–32)
Calcium: 9.4 mg/dL (ref 8.4–10.5)
Chloride: 102 mEq/L (ref 96–112)
Creatinine, Ser: 1.1 mg/dL (ref 0.40–1.50)
GFR: 67.64 mL/min (ref >60.00)
Glucose, Bld: 82 mg/dL (ref 70–99)
Potassium: 4.8 mEq/L (ref 3.5–5.1)
Sodium: 138 mEq/L (ref 135–145)
Total Bilirubin: 1.7 mg/dL — ABNORMAL HIGH (ref 0.2–1.2)
Total Protein: 6.5 g/dL (ref 6.0–8.3)

## 2016-04-27 LAB — TSH: TSH: 0.48 u[IU]/mL (ref 0.35–4.50)

## 2016-04-27 NOTE — Progress Notes (Signed)
Pre visit review using our clinic review tool, if applicable. No additional management support is needed unless otherwise documented below in the visit note. 

## 2016-04-27 NOTE — Telephone Encounter (Signed)
Pt called about having 2 episodes of SOB in 2 days and irregular heart beat.  I transferred pt to team health. Thank you!  Call pt @ [phone].

## 2016-04-27 NOTE — Patient Instructions (Signed)
Chest x-ray at the [facility] office now  Call or return to clinic prn if these symptoms worsen or fail to improve as anticipated.
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
## 2016-04-27 NOTE — Telephone Encounter (Signed)
Tried to reach patient by phone no answer and no answer or voicemail.

## 2016-04-27 NOTE — Telephone Encounter (Addendum)
Called patient to confirm going to [place] office, no answer was able this call to leave message for patient to return call. FYI

## 2016-04-27 NOTE — Telephone Encounter (Signed)
fyi

## 2016-04-27 NOTE — Telephone Encounter (Signed)
[facility] Patient Name: Theodore [name] DOB: Feb 12, 1930 Initial Comment Caller states he has had shortness of breath and heart beat seems to be skipping beats. Nurse Assessment Nurse: Markus Daft, RN, Sherre Poot Date/Time (Eastern Time): 04/27/2016 8:44:16 AM Confirm and document reason for call. If symptomatic, describe symptoms. ---Caller states he has had shortness of breath and heart beat seems to be skipping beats while at rest yesterday evening. "fluttering" feeling. Pulse is skipping beats when felt. Lasted about an hr, and then he felt normal. Right now he feels a few minutes ago he had a fleeting chest pain, not sharp, barely there, lasted a few seconds, and gone now. Does the patient have any new or worsening symptoms? ---Yes Will a triage be completed? ---Yes Related visit to physician within the last 2 weeks? ---No Does the PT have any chronic conditions? (i.e. diabetes, asthma, etc.) ---No Is this a behavioral health or substance abuse call? ---No Guidelines Guideline Title Affirmed Question Affirmed Notes Heart Rate and Heartbeat Questions New or worsened shortness of breath with activity (dyspnea on exertion) Final Disposition User Go to ED Now (or PCP triage) Markus Daft, RN, Windy Comments BP 145/60, and HR in 60's yesterday evening. No available appts at [facility] or [facility]. Pt did not want to go to ER and willing to go to [facility] office where he will be seen at 10:15 am by Dr. Jabier Gauss Referrals REFERRED TO PCP OFFICE Disagree/Comply: Comply

## 2016-04-28 LAB — D-DIMER, QUANTITATIVE (NOT AT ARMC): D-Dimer, Quant: 0.62 mcg/mL FEU — ABNORMAL HIGH (ref <0.50)

## 2016-05-03 ENCOUNTER — Other Ambulatory Visit: Payer: Self-pay | Admitting: *Deleted

## 2016-05-03 NOTE — Telephone Encounter (Signed)
Last office visit 12/23/15 Next office visit 04/27/17 Denisa

## 2016-05-03 NOTE — Telephone Encounter (Signed)
Pt requested a medication refill for testosterone cypionate. Pharmacy [facility]

## 2016-05-04 MED ORDER — TESTOSTERONE CYPIONATE 200 MG/ML IM SOLN: INTRAMUSCULAR | 0 refills | Status: DC

## 2016-05-04 NOTE — Telephone Encounter (Signed)
Refilled    Lab Results  Component Value Date   TESTOSTERONE 421 09/02/2015

## 2016-05-05 NOTE — Telephone Encounter (Signed)
Printed, and faxed to [facility].

## 2016-05-08 ENCOUNTER — Telehealth: Payer: Self-pay | Admitting: Internal Medicine

## 2016-05-08 NOTE — Telephone Encounter (Signed)
Rx was faxed to [facility] on 05/05/16.  Pt informed.

## 2016-05-08 NOTE — Telephone Encounter (Signed)
Pt called requesting a refill on histestosterone cypionate (DEPOTESTOSTERONE CYPIONATE) 200 MG/ML injection . Please advise, thank you!  Pharmacy - [id number] - [place], [place]  Call pt @ [phone]

## 2016-05-09 ENCOUNTER — Observation Stay (HOSPITAL_BASED_OUTPATIENT_CLINIC_OR_DEPARTMENT_OTHER)
Admit: 2016-05-09 | Discharge: 2016-05-09 | Disposition: A | Discharge status: Home / Self Care | Payer: PPO | Attending: Internal Medicine | Admitting: Internal Medicine

## 2016-05-09 ENCOUNTER — Telehealth: Payer: Self-pay | Admitting: Internal Medicine

## 2016-05-09 ENCOUNTER — Encounter: Payer: Self-pay | Admitting: Family Medicine

## 2016-05-09 ENCOUNTER — Observation Stay
Admission: EM | Admit: 2016-05-09 | Discharge: 2016-05-10 | Disposition: A | Discharge status: Home / Self Care | Payer: PPO | Attending: Specialist | Admitting: Specialist

## 2016-05-09 ENCOUNTER — Emergency Department: Payer: PPO

## 2016-05-09 ENCOUNTER — Encounter: Payer: Self-pay | Admitting: *Deleted

## 2016-05-09 ENCOUNTER — Ambulatory Visit (INDEPENDENT_AMBULATORY_CARE_PROVIDER_SITE_OTHER): Payer: PPO | Admitting: Family Medicine

## 2016-05-09 ENCOUNTER — Telehealth: Payer: Self-pay | Admitting: *Deleted

## 2016-05-09 VITALS — BP 90/60 | HR 58 | Temp 97.4°F | Wt 184.6 lb

## 2016-05-09 DIAGNOSIS — Z85820 Personal history of malignant melanoma of skin: Secondary | ICD-10-CM | POA: Diagnosis not present

## 2016-05-09 DIAGNOSIS — J45909 Unspecified asthma, uncomplicated: Secondary | ICD-10-CM | POA: Insufficient documentation

## 2016-05-09 DIAGNOSIS — R634 Abnormal weight loss: Secondary | ICD-10-CM | POA: Diagnosis not present

## 2016-05-09 DIAGNOSIS — Z880 Allergy status to penicillin: Secondary | ICD-10-CM | POA: Diagnosis not present

## 2016-05-09 DIAGNOSIS — R0602 Shortness of breath: Principal | ICD-10-CM | POA: Insufficient documentation

## 2016-05-09 DIAGNOSIS — I25119 Atherosclerotic heart disease of native coronary artery with unspecified angina pectoris: Secondary | ICD-10-CM

## 2016-05-09 DIAGNOSIS — R079 Chest pain, unspecified: Secondary | ICD-10-CM

## 2016-05-09 DIAGNOSIS — I739 Peripheral vascular disease, unspecified: Secondary | ICD-10-CM

## 2016-05-09 DIAGNOSIS — R5383 Other fatigue: Secondary | ICD-10-CM | POA: Diagnosis present

## 2016-05-09 DIAGNOSIS — Z96651 Presence of right artificial knee joint: Secondary | ICD-10-CM | POA: Diagnosis not present

## 2016-05-09 DIAGNOSIS — F418 Other specified anxiety disorders: Secondary | ICD-10-CM | POA: Diagnosis not present

## 2016-05-09 DIAGNOSIS — R0789 Other chest pain: Secondary | ICD-10-CM | POA: Diagnosis not present

## 2016-05-09 DIAGNOSIS — E039 Hypothyroidism, unspecified: Secondary | ICD-10-CM | POA: Diagnosis present

## 2016-05-09 DIAGNOSIS — I700 Atherosclerosis of aorta: Secondary | ICD-10-CM | POA: Diagnosis not present

## 2016-05-09 DIAGNOSIS — R0609 Other forms of dyspnea: Secondary | ICD-10-CM

## 2016-05-09 DIAGNOSIS — Z79899 Other long term (current) drug therapy: Secondary | ICD-10-CM | POA: Insufficient documentation

## 2016-05-09 DIAGNOSIS — E785 Hyperlipidemia, unspecified: Secondary | ICD-10-CM | POA: Diagnosis present

## 2016-05-09 DIAGNOSIS — I2510 Atherosclerotic heart disease of native coronary artery without angina pectoris: Secondary | ICD-10-CM | POA: Insufficient documentation

## 2016-05-09 DIAGNOSIS — R42 Dizziness and giddiness: Secondary | ICD-10-CM | POA: Diagnosis not present

## 2016-05-09 DIAGNOSIS — R7989 Other specified abnormal findings of blood chemistry: Secondary | ICD-10-CM

## 2016-05-09 DIAGNOSIS — R001 Bradycardia, unspecified: Secondary | ICD-10-CM | POA: Diagnosis not present

## 2016-05-09 DIAGNOSIS — E559 Vitamin D deficiency, unspecified: Secondary | ICD-10-CM | POA: Diagnosis not present

## 2016-05-09 DIAGNOSIS — R0600 Dyspnea, unspecified: Secondary | ICD-10-CM

## 2016-05-09 LAB — URINALYSIS, COMPLETE (UACMP) WITH MICROSCOPIC
Bacteria, UA: NONE SEEN
Bilirubin Urine: NEGATIVE
Glucose, UA: NEGATIVE mg/dL
Hgb urine dipstick: NEGATIVE
Ketones, ur: NEGATIVE mg/dL
Leukocytes, UA: NEGATIVE
Nitrite: NEGATIVE
Protein, ur: NEGATIVE mg/dL
Specific Gravity, Urine: 1.01 (ref 1.005–1.030)
Squamous Epithelial / LPF: NONE SEEN
WBC, UA: NONE SEEN WBC/hpf (ref 0–5)
pH: 7 (ref 5.0–8.0)

## 2016-05-09 LAB — CBC
HCT: 44.7 % (ref 40.0–52.0)
Hemoglobin: 15.8 g/dL (ref 13.0–18.0)
MCH: 33.2 pg (ref 26.0–34.0)
MCHC: 35.5 g/dL (ref 32.0–36.0)
MCV: 93.5 fL (ref 80.0–100.0)
Platelets: 219 10*3/uL (ref 150–440)
RBC: 4.78 MIL/uL (ref 4.40–5.90)
RDW: 13.7 % (ref 11.5–14.5)
WBC: 5.1 10*3/uL (ref 3.8–10.6)

## 2016-05-09 LAB — BASIC METABOLIC PANEL
Anion gap: 5 (ref 5–15)
BUN: 18 mg/dL (ref 6–20)
CO2: 29 mmol/L (ref 22–32)
Calcium: 8.7 mg/dL — ABNORMAL LOW (ref 8.9–10.3)
Chloride: 103 mmol/L (ref 101–111)
Creatinine, Ser: 1.19 mg/dL (ref 0.61–1.24)
GFR calc Af Amer: >60 mL/min (ref >60)
GFR calc non Af Amer: 54 mL/min — ABNORMAL LOW (ref >60)
Glucose, Bld: 87 mg/dL (ref 65–99)
Potassium: 4.5 mmol/L (ref 3.5–5.1)
Sodium: 137 mmol/L (ref 135–145)

## 2016-05-09 LAB — MRSA PCR SCREENING: MRSA by PCR: NEGATIVE

## 2016-05-09 LAB — TROPONIN I
Troponin I: <0.03 ng/mL (ref <0.03)
Troponin I: <0.03 ng/mL (ref <0.03)
Troponin I: <0.03 ng/mL (ref <0.03)

## 2016-05-09 MED ORDER — TRAZODONE HCL 50 MG PO TABS: 25.0000 mg | ORAL_TABLET | Freq: Every evening | ORAL | Status: DC | PRN

## 2016-05-09 MED ORDER — ACETAMINOPHEN 650 MG RE SUPP: 650.0000 mg | Freq: Four times a day (QID) | RECTAL | Status: DC | PRN

## 2016-05-09 MED ORDER — ASPIRIN 81 MG PO CHEW
324.0000 mg | CHEWABLE_TABLET | Freq: Once | ORAL | Status: AC
  Administered 2016-05-09: 243 mg via ORAL
  Filled 2016-05-09: qty 4

## 2016-05-09 MED ORDER — ALPRAZOLAM 0.25 MG PO TABS: 0.2500 mg | ORAL_TABLET | Freq: Every day | ORAL | Status: DC | PRN

## 2016-05-09 MED ORDER — ENOXAPARIN SODIUM 40 MG/0.4ML ~~LOC~~ SOLN
SUBCUTANEOUS | Status: AC
Start: 2016-05-09 — End: 2016-05-09
  Administered 2016-05-09: 40 mg via SUBCUTANEOUS
  Filled 2016-05-09: qty 0.4

## 2016-05-09 MED ORDER — HEPARIN SODIUM (PORCINE) 5000 UNIT/ML IJ SOLN: 5000.0000 [IU] | Freq: Three times a day (TID) | INTRAMUSCULAR | Status: DC

## 2016-05-09 MED ORDER — ACETAMINOPHEN 325 MG PO TABS: 650.0000 mg | ORAL_TABLET | Freq: Four times a day (QID) | ORAL | Status: DC | PRN

## 2016-05-09 MED ORDER — NITROGLYCERIN 2 % TD OINT
TOPICAL_OINTMENT | TRANSDERMAL | Status: AC
  Filled 2016-05-09: qty 1

## 2016-05-09 MED ORDER — ONDANSETRON HCL 4 MG/2ML IJ SOLN: 4.0000 mg | Freq: Four times a day (QID) | INTRAMUSCULAR | Status: DC | PRN

## 2016-05-09 MED ORDER — BISACODYL 5 MG PO TBEC
5.0000 mg | DELAYED_RELEASE_TABLET | Freq: Every day | ORAL | Status: DC | PRN
  Filled 2016-05-09: qty 1

## 2016-05-09 MED ORDER — HYDROCODONE-ACETAMINOPHEN 5-325 MG PO TABS: 1.0000 | ORAL_TABLET | ORAL | Status: DC | PRN

## 2016-05-09 MED ORDER — DOCUSATE SODIUM 100 MG PO CAPS
100.0000 mg | ORAL_CAPSULE | Freq: Two times a day (BID) | ORAL | Status: DC
  Administered 2016-05-10: 100 mg via ORAL
  Filled 2016-05-09: qty 1

## 2016-05-09 MED ORDER — DOCUSATE SODIUM 100 MG PO CAPS
ORAL_CAPSULE | ORAL | Status: AC
  Administered 2016-05-09: 100 mg
  Filled 2016-05-09: qty 1

## 2016-05-09 MED ORDER — ASPIRIN EC 81 MG PO TBEC
81.0000 mg | DELAYED_RELEASE_TABLET | Freq: Every day | ORAL | Status: DC
  Administered 2016-05-10: 81 mg via ORAL
  Filled 2016-05-09: qty 1

## 2016-05-09 MED ORDER — ONDANSETRON HCL 4 MG PO TABS: 4.0000 mg | ORAL_TABLET | Freq: Four times a day (QID) | ORAL | Status: DC | PRN

## 2016-05-09 MED ORDER — LEVOTHYROXINE SODIUM 100 MCG PO TABS
100.0000 ug | ORAL_TABLET | Freq: Every day | ORAL | Status: DC
  Administered 2016-05-10: 100 ug via ORAL
  Filled 2016-05-09: qty 1

## 2016-05-09 MED ORDER — ENOXAPARIN SODIUM 40 MG/0.4ML ~~LOC~~ SOLN: 40.0000 mg | SUBCUTANEOUS | Status: DC

## 2016-05-09 MED ORDER — NITROGLYCERIN 2 % TD OINT: 0.5000 [in_us] | TOPICAL_OINTMENT | Freq: Four times a day (QID) | TRANSDERMAL | Status: DC

## 2016-05-09 MED ORDER — ENOXAPARIN SODIUM 40 MG/0.4ML ~~LOC~~ SOLN
40.0000 mg | SUBCUTANEOUS | Status: DC
  Administered 2016-05-09: 40 mg via SUBCUTANEOUS

## 2016-05-09 NOTE — ED Triage Notes (Addendum)
States SOB and bilateral leg weakness for several weeks, denies any cough, awake and alert in no acute distress, states he was sent from PCP office for low BP today

## 2016-05-09 NOTE — ED Notes (Signed)
Report to Finley, RN on 2A.

## 2016-05-09 NOTE — Progress Notes (Signed)
Pre visit review using our clinic review tool, if applicable. No additional management support is needed unless otherwise documented below in the visit note. 

## 2016-05-09 NOTE — Progress Notes (Signed)
  Tommi Rumps, MD Phone: [phone]  Theodore [name] is a 83 y.o. male who presents today for same day visit.  Patient reports over the last several weeks he's had progressive onset of dyspnea on exertion and chest tightness. Notes a fluttery sensation in his chest as well. Has felt faint at times specifically when he goes from a bent over to standing up. Notes he's been very slow to go upstairs given shortness of breath and weakness. His shortness of breath is more prevalent with exertion. He has no known cardiac history. He was evaluated 2 weeks ago by another physician and had a reassuring chest x-ray and EKG. There was a slight elevation to his d-dimer at that time. He was advised earlier today by our on-call service to go to the emergency room for evaluation though he deferred to this office visit. He does have a history of hyperlipidemia. He notes more persistent symptoms now than previously.  PMH: nonsmoker.   ROS see history of present illness  Objective  Physical Exam Vitals:   05/09/16 1045  BP: 90/60  Pulse: (!) 58  Temp: 97.4 F (36.3 C)    BP Readings from Last 3 Encounters:  05/09/16 90/60  04/27/16 (!) 148/72  04/14/16 120/70   Wt Readings from Last 3 Encounters:  05/09/16 184 lb 9.6 oz (83.7 kg)  04/27/16 185 lb 9.6 oz (84.2 kg)  04/14/16 186 lb 12.8 oz (84.7 kg)    Physical Exam  Constitutional: No distress.  HENT:  Head: Normocephalic and atraumatic.  Mouth/Throat: Oropharynx is clear and moist. No oropharyngeal exudate.  Eyes: Conjunctivae are normal. Pupils are equal, round, and reactive to light.  Cardiovascular: Normal rate, regular rhythm and normal heart sounds.   Pulmonary/Chest: Breath sounds normal. No respiratory distress. He has no wheezes. He has no rales.  At times patient has increased work of breathing though at other times appears normal  Musculoskeletal: He exhibits no edema.  Neurological: He is alert. Gait normal.  Skin: Skin is  warm and dry. He is not diaphoretic.   EKG: Sinus bradycardia with rate variation, rate 54, no apparent ischemic changes  Assessment/Plan: Please see individual problem list.  Shortness of breath Patient with progressive exertional shortness of breath and chest tightness. Does have some shortness of breath at rest at times. Prior workup did reveal a slightly elevated d-dimer though was otherwise reassuring. Given that he has had progressive worsening symptoms and at times appears to have shortness of breath in the office I advised emergency room evaluation. I advised EMS transport though he declined and did sign an AMA form which will be scanned into the chart. I did discuss the risks of transporting himself. He'll transport himself to the emergency room for evaluation. CMA contacted the ED charge nurse and informed them the patient was on his way.   Orders Placed This Encounter  Procedures  . EKG 12-Lead    Tommi Rumps, MD [place]

## 2016-05-09 NOTE — Telephone Encounter (Signed)
[facility]     Patient Name: Theodore [name] Initial Comment Caller states pt having SOB;   DOB: [birth year]      Nurse Assessment  Nurse: Julien Girt, RN, Almyra Free Date/Time (Eastern Time): 05/09/2016 8:36:02 AM  Confirm and document reason for call. If symptomatic, describe symptoms. ---Caller states he is having sob. He was seen on the 15th, neg chest x-ray, normal EKG, no dx. He feels like his sob it worse, is daily now and his heart is fluttering. He has weakness in his legs   Does the patient have any new or worsening symptoms? ---Yes   Will a triage be completed? ---Yes   Related visit to physician within the last 2 weeks? ---Yes   Does the PT have any chronic conditions? (i.e. diabetes, asthma, etc.) ---Yes   List chronic conditions. ---Hypothyroidism   Is this a behavioral health or substance abuse call? ---No     Guidelines     Guideline Title Affirmed Question Affirmed Notes   Breathing Difficulty Sounds like a life-threatening emergency to the triager    Final Disposition User   Call EMS 911 Now Julien Girt, RN, Almyra Free   Comments   Caller refuses 911 outcome or ED. Advised that I would notify his PCP now.   Upgraded to 911 outcome , caller is audibly sob and has chest tighteness   Referrals   GO TO FACILITY REFUSED   Disagree/Comply: Disagree   Disagree/Comply Reason: Disagree with instructions

## 2016-05-09 NOTE — Patient Instructions (Signed)
Nice to meet you. Please go to the ED for evaluation immediately. If you develop any new or worsening symptoms on the way please pull over and call 911.

## 2016-05-09 NOTE — ED Notes (Signed)
Pt taken to floor by Anda Kraft, RN on cardiac monitor. Pt alert and oriented X4, active, cooperative, pt in NAD. RR even and unlabored, color WNL.  All of belongings sent with patient.

## 2016-05-09 NOTE — Telephone Encounter (Signed)
fyi

## 2016-05-09 NOTE — ED Provider Notes (Addendum)
[facility] Emergency Department Provider Note  ____________________________________________   I have reviewed the triage vital signs and the nursing notes.   HISTORY  Chief Complaint Shortness of Breath    HPI Theodore [name] is a 83 y.o. male who presents today complaining of shortness of breath with exertion for the last 3 weeks. Also a chest tightness when he goes upstairs. Symptoms resolve at rest. He states that his never had this before. He states that normally he exercises regularly but he is unable to do so. He normally plays harmonica begets winded plain harmonica. He denies any pleuritic chest pain and leg swelling or personal or family history of PE or DVT. Denies any recent travel or surgery. He states that the discomfort in his chest when he exerts himself as a tightness, like "as if you have stage fright"he states that he has had no nausea or vomiting. The pain is substernal, does not radiate. He states that he also has been having some palpitations. He was seen and evaluated by his cardiologist a few weeks ago and the symptoms have progressed significantly since then. He did also see his primary care doctor today who noted that his blood pressure was somewhat low. Patient states that when he stands up too quickly, he feels lightheaded. He also states that he has had right lateral lower extremity feelings of generalized weakness. He denies any focal numbness or weakness headache or stiff neck. He denies any incontinence of bowel or bladder, he denies any diarrheal symptoms or urinary symptoms. He denies cough or URI symptoms. Lying in the bed at this time he is asymptomatic     Past Medical History:  Diagnosis Date  . Arthritis   . Cancer [facility]) 2002   melanoma right ear  . Colon polyp   . Depression   . Thyroid disease    hypothyroid  . Tremors of nervous system    Benign    Patient Active Problem List   Diagnosis Date Noted  . Shortness  of breath 05/09/2016  . Otitis externa, eczematoid 12/26/2015  . Anterior knee pain, left 12/26/2015  . Choking episode 12/23/2015  . History of anemia 07/09/2014  . Hypothyroidism 07/04/2014  . Hyperlipidemia LDL goal <100 07/04/2014  . Nonallergic vasomotor rhinitis 07/01/2014  . Long-term use of high-risk medication 01/16/2014  . Vitamin D deficiency 07/31/2013  . Fatigue 07/31/2013  . Depression with anxiety 04/28/2013  . Low serum testosterone level 04/28/2013    Past Surgical History:  Procedure Laterality Date  . JOINT REPLACEMENT Right 2003   knee  . MELANOMA EXCISION Right    ear. Followed by Dr. Nehemiah Massed    Prior to Admission medications   Medication Sig Start Date End Date Taking? Authorizing Provider  ALPRAZolam (XANAX) 0.25 MG tablet Take 1 tablet (0.25 mg total) by mouth daily as needed for anxiety. 01/31/16   [name], MD  cholecalciferol (VITAMIN D) 1000 UNITS tablet Take 4,000 Units by mouth daily.     Historical Provider, MD  co-enzyme Q-10 30 MG capsule Take 30 mg by mouth 3 (three) times daily.    Historical Provider, MD  COD LIVER OIL PO Take by mouth.    Historical Provider, MD  cyanocobalamin 500 MCG tablet Take 500 mcg by mouth daily.    Historical Provider, MD  Flaxseed, Linseed, (FLAX SEEDS PO) Take by mouth.    Historical Provider, MD  fluocinonide ointment (LIDEX) 3.26 % Apply 1 application topically 2 (two) times  daily. To external ear canal for one week as needed 12/26/15   [name], MD  Hydrogen Peroxide 30 % SOLN by Does not apply route. 5 drops per day in water (food grade)    Historical Provider, MD  Iodine, Kelp, 0.15 MG TABS Take 1 tablet by mouth daily.    Historical Provider, MD  levothyroxine (SYNTHROID, LEVOTHROID) 100 MCG tablet TAKE 1 TABLET (100 MCG TOTAL) BY MOUTH DAILY BEFORE BREAKFAST. 12/27/15   [name], MD  magnesium oxide (MAG-OX) 400 MG tablet Take 400 mg by mouth daily.    Historical Provider, MD  meloxicam  (MOBIC) 15 MG tablet Take 1 tablet (15 mg total) by mouth daily. 12/23/15   [name], MD  mometasone (ELOCON) 0.1 % cream Apply 1 application topically daily. To ear canal for itching 02/03/15   [name], MD  OVER THE COUNTER MEDICATION Tumeric daily    Historical Provider, MD  OVER THE COUNTER MEDICATION Standard Process- Cardio Plus    Historical Provider, MD  OVER THE COUNTER MEDICATION Standard Process- [place]    Historical Provider, MD  OVER THE COUNTER MEDICATION Standard Process- Folic Acid    Historical Provider, MD  OVER THE COUNTER MEDICATION Standard PRocess- [place]    Historical Provider, MD  Probiotic Product (PROBIOTIC ACIDOPHILUS) CAPS Take 1 capsule by mouth daily.    Historical Provider, MD  testosterone cypionate (DEPOTESTOSTERONE CYPIONATE) 200 MG/ML injection INJECT 0.25 MLS INTO THE MUSCLE EVERY 14 DAYS 05/04/16   [name], MD  vitamin E (VITAMIN E) 400 UNIT capsule Take 400 Units by mouth daily.    Historical Provider, MD    Allergies Penicillins  Family History  Problem Relation Age of Onset  . Hypertension Mother   . Heart disease Mother     CHF  . Heart disease Father   . Cancer Sister     melanoma    Social History Social History  Substance Use Topics  . Smoking status: Never Smoker  . Smokeless tobacco: Never Used  . Alcohol use Yes     Comment: 1 glass of wine daily    Review of Systems Constitutional: No fever/chills Eyes: No visual changes. ENT: No sore throat. No stiff neck no neck pain Cardiovascular: Positive chest pain. Respiratory: Denies shortness of breath. Gastrointestinal:   no vomiting.  No diarrhea.  No constipation. Genitourinary: Positive for dysuria. Musculoskeletal: Negative lower extremity swelling Skin: Negative for rash. Neurological: Negative for severe headaches, focal weakness or numbness. 10-point ROS otherwise negative.  ____________________________________________   PHYSICAL EXAM:  VITAL  SIGNS: ED Triage Vitals [05/09/16 1123]  Enc Vitals Group     BP (!) 141/65     Pulse Rate (!) 52     Resp 16     Temp 97.8 F (36.6 C)     Temp Source Oral     SpO2 100 %     Weight 177 lb (80.3 kg)     Height 6\' 3"  (1.905 m)     Head Circumference      Peak Flow      Pain Score      Pain Loc      Pain Edu?      Excl. in [place]?     Constitutional: Alert and oriented. Well appearing and in no acute distress. Eyes: Conjunctivae are normal. PERRL. EOMI. Head: Atraumatic. Nose: No congestion/rhinnorhea. Mouth/Throat: Mucous membranes are moist.  Oropharynx non-erythematous. Neck: No stridor.   Nontender with no meningismus  Cardiovascular: Normal rate, regular rhythm. Grossly normal heart sounds.  Good peripheral circulation. Respiratory: Normal respiratory effort.  No retractions. Lungs CTAB. Abdominal: Soft and nontender. No distention. No guarding no rebound Back:  There is no focal tenderness or step off.  there is no midline tenderness there are no lesions noted. there is no CVA tenderness Musculoskeletal: No lower extremity tenderness, no upper extremity tenderness. No joint effusions, no DVT signs strong distal pulses no edema Neurologic:  Normal speech and language. No gross focal neurologic deficits are appreciated.  Skin:  Skin is warm, dry and intact. No rash noted. Psychiatric: Mood and affect are normal. Speech and behavior are normal.  ____________________________________________   LABS (all labs ordered are listed, but only abnormal results are displayed)  Labs Reviewed  BASIC METABOLIC PANEL - Abnormal; Notable for the following:       Result Value   Calcium 8.7 (*)    GFR calc non Af Amer 54 (*)    All other components within normal limits  CBC  URINALYSIS, COMPLETE (UACMP) WITH MICROSCOPIC  CBG MONITORING, ED   ____________________________________________  EKG  I personally interpreted any EKGs ordered by me or triage Sinus Riccardi rate 55 bpm no  acute ST elevation or acute ST depression normal axis unremarkable EKG aside from mild bradycardia ____________________________________________  RADIOLOGY  I reviewed any imaging ordered by me or triage that were performed during my shift and, if possible, patient and/or family made aware of any abnormal findings. ____________________________________________   PROCEDURES  Procedure(s) performed: None  Procedures  Critical Care performed: None  ____________________________________________   INITIAL IMPRESSION / ASSESSMENT AND PLAN / ED COURSE  Pertinent labs & imaging results that were available during my care of the patient were reviewed by me and considered in my medical decision making (see chart for details).  Patient with concerning anginal like symptoms with increasing dyspnea on exertion and discomfort with exertion. No suspicion for PE given the exertional nature of these symptoms and low risk factors for PE. Nothing to suggest dissection. We are going to have to admit this patient to the hospital for further evaluation given the progressive nature of his symptoms and the fact that he was referred here from his doctor's office for further evaluation    ____________________________________________   FINAL CLINICAL IMPRESSION(S) / ED DIAGNOSES  Final diagnoses:  SOB (shortness of breath)      This chart was dictated using voice recognition software.  Despite best efforts to proofread,  errors can occur which can change meaning.      Schuyler Amor, MD 05/09/16 [place], MD 05/09/16 [phone]

## 2016-05-09 NOTE — Telephone Encounter (Signed)
Patient main complaint is SOB on exertion with chest tightness and pressure , patient refused ED advised Dr. Caryl Bis, and patient still refused ED even with provider preference he go to ED . Patient coming in at 10.45

## 2016-05-09 NOTE — Assessment & Plan Note (Signed)
Patient with progressive exertional shortness of breath and chest tightness. Does have some shortness of breath at rest at times. Prior workup did reveal a slightly elevated d-dimer though was otherwise reassuring. Given that he has had progressive worsening symptoms and at times appears to have shortness of breath in the office I advised emergency room evaluation. I advised EMS transport though he declined and did sign an AMA form which will be scanned into the chart. I did discuss the risks of transporting himself. He'll transport himself to the emergency room for evaluation. CMA contacted the ED charge nurse and informed them the patient was on his way.

## 2016-05-09 NOTE — H&P (Signed)
[facility] [place] NAME: Theodore [name]    MR#:  [id number]  DATE OF BIRTH:  [birth year]  DATE OF ADMISSION:  05/09/2016  PRIMARY CARE PHYSICIAN: [name], MD   REQUESTING/REFERRING PHYSICIAN: DR.Mcshane  CHIEF COMPLAINT: shortness of breath, fatigue.    Chief Complaint  Patient presents with  . Shortness of Breath    HISTORY OF PRESENT ILLNESS:  Theodore [name]  is a 83 y.o. male with a known history of Arthritis, depression, thyroid problems and came in because of shortness of breath, fatigue. Patient has no chest pain. No pedal edema. Has been having shortness of breath for almost 3 weeks on and off, patient has exertional dyspnea unable to take a flight of stairs without getting out of breath, having some dizziness associated with shortness of breath. Patient usually runs 2 miles on stationary bike and he is not able to do that recently for the past 1 week. He also plays harmonica but the according to him he is not able to do it because of shortness of breath. Has strong family history of heart disease, father died at the age of 45 with heart problems. Noted to have bradycardia with heart rate  47-56. She denies wheezing. No cough. Has chest pain. Pain complaint is shortness of breath, fatigue, dizziness, occasional fluttering in the chest.  PAST MEDICAL HISTORY:   Past Medical History:  Diagnosis Date  . Arthritis   . Cancer [facility]) 2002   melanoma right ear  . Colon polyp   . Depression   . Thyroid disease    hypothyroid  . Tremors of nervous system    Benign    PAST SURGICAL HISTOIRY:   Past Surgical History:  Procedure Laterality Date  . JOINT REPLACEMENT Right 2003   knee  . MELANOMA EXCISION Right    ear. Followed by Dr. Nehemiah Massed    SOCIAL HISTORY:   Social History  Substance Use Topics  . Smoking status: Never Smoker  . Smokeless tobacco: Never Used  . Alcohol use Yes     Comment: 1 glass of wine daily     FAMILY HISTORY:   Family History  Problem Relation Age of Onset  . Hypertension Mother   . Heart disease Mother     CHF  . Heart disease Father   . Cancer Sister     melanoma    DRUG ALLERGIES:   Allergies  Allergen Reactions  . Penicillins Rash    REVIEW OF SYSTEMS:  CONSTITUTIONAL: No fever, fatigue or weakness.  EYES: No blurred or double vision.  EARS, NOSE, AND THROAT: No tinnitus or ear pain.  RESPIRATORY: Shortness of breath, exertional dyspnea. CARDIOVASCULAR: No chest pain, orthopnea, edema.  GASTROINTESTINAL: No nausea, vomiting, diarrhea or abdominal pain.  GENITOURINARY: No dysuria, hematuria.  ENDOCRINE: No polyuria, nocturia,  HEMATOLOGY: No anemia, easy bruising or bleeding SKIN: No rash or lesion. MUSCULOSKELETAL: No joint pain or arthritis.   NEUROLOGIC: No tingling, numbness, weakness.  PSYCHIATRY: No anxiety or depression.   MEDICATIONS AT HOME:   Prior to Admission medications   Medication Sig Start Date End Date Taking? Authorizing Provider  cholecalciferol (VITAMIN D) 1000 UNITS tablet Take 4,000 Units by mouth daily.    Yes Historical Provider, MD  co-enzyme Q-10 30 MG capsule Take 30 mg by mouth daily.    Yes Historical Provider, MD  COD LIVER OIL PO Take by mouth.   Yes Historical Provider, MD  cyanocobalamin 500 MCG  tablet Take 500 mcg by mouth daily.   Yes Historical Provider, MD  fluocinonide ointment (LIDEX) 4.58 % Apply 1 application topically 2 (two) times daily. To external ear canal for one week as needed 12/26/15  Yes [name], MD  Iodine, Kelp, 0.15 MG TABS Take 1 tablet by mouth daily.   Yes Historical Provider, MD  levothyroxine (SYNTHROID, LEVOTHROID) 100 MCG tablet TAKE 1 TABLET (100 MCG TOTAL) BY MOUTH DAILY BEFORE BREAKFAST. 12/27/15  Yes [name], MD  magnesium oxide (MAG-OX) 400 MG tablet Take 400 mg by mouth daily.   Yes Historical Provider, MD  OVER THE COUNTER MEDICATION Tumeric daily   Yes Historical  Provider, MD  OVER THE COUNTER MEDICATION Take 3 tablets by mouth daily. Standard Process- Cardio Plus    Yes Historical Provider, MD  OVER THE COUNTER MEDICATION Standard Process- [place]   Yes Historical Provider, MD  OVER THE COUNTER MEDICATION Standard Process- Folic Acid   Yes Historical Provider, MD  OVER THE COUNTER MEDICATION Take by mouth daily. Hemp oil   Yes Historical Provider, MD  Probiotic Product (PROBIOTIC ACIDOPHILUS) CAPS Take 1 capsule by mouth daily.   Yes Historical Provider, MD  testosterone cypionate (DEPOTESTOSTERONE CYPIONATE) 200 MG/ML injection INJECT 0.25 MLS INTO THE MUSCLE EVERY 14 DAYS 05/04/16  Yes [name], MD  vitamin E (VITAMIN E) 400 UNIT capsule Take 400 Units by mouth daily.   Yes Historical Provider, MD  ALPRAZolam (XANAX) 0.25 MG tablet Take 1 tablet (0.25 mg total) by mouth daily as needed for anxiety. Patient not taking: Reported on 05/09/2016 01/31/16   [name], MD  Flaxseed, Linseed, (FLAX SEEDS PO) Take by mouth.    Historical Provider, MD  Hydrogen Peroxide 30 % SOLN by Does not apply route. 5 drops per day in water (food grade)    Historical Provider, MD  meloxicam (MOBIC) 15 MG tablet Take 1 tablet (15 mg total) by mouth daily. Patient not taking: Reported on 05/09/2016 12/23/15   [name], MD  mometasone (ELOCON) 0.1 % cream Apply 1 application topically daily. To ear canal for itching Patient not taking: Reported on 05/09/2016 02/03/15   [name], MD  OVER THE COUNTER MEDICATION Standard PRocess- [place]    Historical Provider, MD      VITAL SIGNS:  Blood pressure (!) 156/68, pulse (!) 44, temperature 97.8 F (36.6 C), temperature source Oral, resp. rate 14, height 6\' 3"  (1.905 m), weight 80.3 kg (177 lb), SpO2 100 %.  PHYSICAL EXAMINATION:  GENERAL:  83 y.o.-year-old patient lying in the bed with no acute distress.  EYES: Pupils equal, round, reactive to light and accommodation. No scleral icterus. Extraocular  muscles intact.  HEENT: Head atraumatic, normocephalic. Oropharynx and nasopharynx clear.  NECK:  Supple, no jugular venous distention. No thyroid enlargement, no tenderness.  LUNGS: Normal breath sounds bilaterally, no wheezing, rales,rhonchi or crepitation. No use of accessory muscles of respiration.  CARDIOVASCULAR: S1, S2 normal. No murmurs, rubs, or gallops.  ABDOMEN: Soft, nontender, nondistended. Bowel sounds present. No organomegaly or mass.  EXTREMITIES: No pedal edema, cyanosis, or clubbing.  NEUROLOGIC: Cranial nerves II through XII are intact. Muscle strength 5/5 in all extremities. Sensation intact. Gait not checked.  PSYCHIATRIC: The patient is alert and oriented x 3.  SKIN: No obvious rash, lesion, or ulcer.   LABORATORY PANEL:   CBC  Recent Labs Lab 05/09/16 1131  WBC 5.1  HGB 15.8  HCT 44.7  PLT 219   ------------------------------------------------------------------------------------------------------------------  Chemistries   Recent Labs Lab 05/09/16 1131  NA 137  K 4.5  CL 103  CO2 29  GLUCOSE 87  BUN 18  CREATININE 1.19  CALCIUM 8.7*   ------------------------------------------------------------------------------------------------------------------  Cardiac Enzymes  Recent Labs Lab 05/09/16 1131  TROPONINI <0.03   ------------------------------------------------------------------------------------------------------------------  RADIOLOGY:  Dg Chest 2 View  Result Date: 05/09/2016 CLINICAL DATA:  Shortness of breath, wheezing, leg weakness for 3 weeks EXAM: CHEST  2 VIEW COMPARISON:  None. FINDINGS: Cardiomediastinal silhouette is unremarkable. No infiltrate or pleural effusion. No pulmonary edema. Mild hyperinflation. Mild degenerative changes lower thoracic spine. IMPRESSION: No active cardiopulmonary disease.  Mild hyperinflation. Electronically Signed   By: Lahoma Crocker M.D.   On: 05/09/2016 12:07    EKG:   Orders placed or performed  during the hospital encounter of 05/09/16  . ED EKG  . ED EKG   EKG shows sinus bradycardia 55 bpm without any ST-T changes. IMPRESSION AND PLAN:   [age] year old male patient with exertional dyspnea, dizziness concerning for ACS:/UA. Patient will be admitted to observation status, cycle troponins 2 more times, check echocardiogram, continue aspirin, nitrates. Unable to give beta blockers because of bradycardia.  #2 hypothyroidism #3 depression Number for dizziness likely due to bradycardia, pt  May need Holter because he also complained of some  skipped  Beats in between.'  All the records are reviewed and case discussed with ED provider. Management plans discussed with the patient, family and they are in agreement.  CODE STATUS:full  TOTAL TIME TAKING CARE OF THIS PATIENT: 55 minutes.    Epifanio Lesches M.D on 05/09/2016 at 3:06 PM  Between 7am to 6pm - Pager - [phone]  After 6pm go to [email or web address] - password [email or web address] [place] [facility]  Office  [phone]  CC: Primary care physician; [name], MD  Note: This dictation was prepared with Dragon dictation along with smaller phrase technology. Any transcriptional errors that result from this process are unintentional.

## 2016-05-09 NOTE — ED Notes (Signed)
Pt given meal tray and drink. Updated on plan of care. Denies further needs at this time.

## 2016-05-09 NOTE — Telephone Encounter (Signed)
Pt was transferred over to [facility]. Pt has SOB . Pt contact [phone]

## 2016-05-09 NOTE — ED Notes (Signed)
Attempt to call report, transferred to nurse who is receiving patient with no answer X 3.

## 2016-05-10 ENCOUNTER — Encounter: Payer: Self-pay | Admitting: Radiology

## 2016-05-10 ENCOUNTER — Observation Stay: Payer: PPO

## 2016-05-10 DIAGNOSIS — R0609 Other forms of dyspnea: Secondary | ICD-10-CM

## 2016-05-10 DIAGNOSIS — R001 Bradycardia, unspecified: Secondary | ICD-10-CM

## 2016-05-10 DIAGNOSIS — I700 Atherosclerosis of aorta: Secondary | ICD-10-CM | POA: Diagnosis not present

## 2016-05-10 DIAGNOSIS — R5382 Chronic fatigue, unspecified: Secondary | ICD-10-CM

## 2016-05-10 DIAGNOSIS — F418 Other specified anxiety disorders: Secondary | ICD-10-CM | POA: Diagnosis not present

## 2016-05-10 DIAGNOSIS — I25119 Atherosclerotic heart disease of native coronary artery with unspecified angina pectoris: Secondary | ICD-10-CM | POA: Diagnosis not present

## 2016-05-10 DIAGNOSIS — I739 Peripheral vascular disease, unspecified: Secondary | ICD-10-CM

## 2016-05-10 DIAGNOSIS — R42 Dizziness and giddiness: Secondary | ICD-10-CM | POA: Diagnosis not present

## 2016-05-10 DIAGNOSIS — R0602 Shortness of breath: Secondary | ICD-10-CM | POA: Diagnosis not present

## 2016-05-10 LAB — BASIC METABOLIC PANEL
Anion gap: 5 (ref 5–15)
BUN: 20 mg/dL (ref 6–20)
CO2: 27 mmol/L (ref 22–32)
Calcium: 8.6 mg/dL — ABNORMAL LOW (ref 8.9–10.3)
Chloride: 108 mmol/L (ref 101–111)
Creatinine, Ser: 1.08 mg/dL (ref 0.61–1.24)
GFR calc Af Amer: >60 mL/min (ref >60)
GFR calc non Af Amer: >60 mL/min (ref >60)
Glucose, Bld: 93 mg/dL (ref 65–99)
Potassium: 4.1 mmol/L (ref 3.5–5.1)
Sodium: 140 mmol/L (ref 135–145)

## 2016-05-10 LAB — CBC
HCT: 43 % (ref 40.0–52.0)
Hemoglobin: 15.3 g/dL (ref 13.0–18.0)
MCH: 33.2 pg (ref 26.0–34.0)
MCHC: 35.6 g/dL (ref 32.0–36.0)
MCV: 93.3 fL (ref 80.0–100.0)
Platelets: 185 10*3/uL (ref 150–440)
RBC: 4.61 MIL/uL (ref 4.40–5.90)
RDW: 13.4 % (ref 11.5–14.5)
WBC: 4.1 10*3/uL (ref 3.8–10.6)

## 2016-05-10 LAB — GLUCOSE, CAPILLARY: Glucose-Capillary: 80 mg/dL (ref 65–99)

## 2016-05-10 LAB — TROPONIN I: Troponin I: <0.03 ng/mL (ref <0.03)

## 2016-05-10 LAB — ECHOCARDIOGRAM COMPLETE
Height: 75 in
Weight: 2832 oz

## 2016-05-10 MED ORDER — IOPAMIDOL (ISOVUE-370) INJECTION 76%
75.0000 mL | Freq: Once | INTRAVENOUS | Status: AC | PRN
  Administered 2016-05-10: 75 mL via INTRAVENOUS

## 2016-05-10 NOTE — Progress Notes (Signed)
Initial Nutrition Assessment  DOCUMENTATION CODES:   Non-severe (moderate) malnutrition in context of chronic illness  INTERVENTION:  1. Magic cup TID with meals, each supplement provides 290 kcal and 9 grams of protein  NUTRITION DIAGNOSIS:   Malnutrition related to chronic illness as evidenced by severe depletion of muscle mass, moderate depletion of body fat, severe depletion of body fat, moderate depletions of muscle mass.  GOAL:   Patient will meet greater than or equal to 90% of their needs  MONITOR:   PO intake, I & O's, Labs, Weight trends, Supplement acceptance  REASON FOR ASSESSMENT:   Malnutrition Screening Tool    ASSESSMENT:   Theodore [name]  is a 83 y.o. male with a known history of Arthritis, depression, thyroid problems and came in because of shortness of breath, fatigue. Patient has no chest pain. No pedal edema. Has been having shortness of breath for almost 3 weeks on and off, patient has exertional dyspnea unable to take a flight of stairs without getting out of breath, having some dizziness associated with shortness of breath  Spoke with Mr. Shahan, Wife at bedside. He reports good appetite PTA - eating 3 meals/day consisting of: Waffle with peanut butter Hard boiled Egg and Apple Chicken or fish with vegetables and starch for dinner. Patient cooks regularly. Per wife, patient has bouts of depression/anxiety that cause him to lose weight, had these since he was a teenager. Recently lost 8#/4% over 4.5 months. Ate meatloaf with green beans for dinner yesterday. Documented meal completion 100% thus far. Despite apparent good PO intake patient exhibits Severe muscle wasting at temples and clavicles. Moderate muscle wasting/fat depletions at arms, legs.  Labs and medications reviewed: Colace  Diet Order:  Diet Heart Room service appropriate? Yes; Fluid consistency: Thin  Skin:  Reviewed, no issues  Last BM:  05/09/2016  Height:   Ht Readings from Last 1  Encounters:  05/09/16 6\' 3"  (1.905 m)    Weight:   Wt Readings from Last 1 Encounters:  05/10/16 189 lb 8 oz (86 kg)    Ideal Body Weight:  89.09 kg  BMI:  Body mass index is 23.69 kg/m.  Estimated Nutritional Needs:   Kcal:  1700-2150 calories  Protein:  86-103 gm  Fluid:  >/= 1.7L  EDUCATION NEEDS:   No education needs identified at this time  Satira Anis. Morrissa Shein, MS, RD LDN Inpatient Clinical Dietitian Pager [phone]

## 2016-05-10 NOTE — Consult Note (Signed)
Cardiology Consultation Note  Patient ID: Theodore [name], MRN: [id number], DOB/AGE: 14-Jun-1929 83 y.o. Admit date: 05/09/2016   Date of Consult: 05/10/2016 Primary Physician: Theodore Mc, MD Primary Cardiologist: Theodore [name] - consult by Theodore [name] Requesting Physician: Dr. Verdell Carmine, MD  Chief Complaint: SOB/fatigue Reason for Consult: Same/bradycardia  HPI: 83 y.o. male with h/o asymptomatic bradycardia dating back to at least 2015, tremors melanoma of the right ear, hypothyroidism on replacement therapy with levothyroxine, and depression who presented to [facility] ED on 2/27 with increased shortness of breath, generalized fatigue, and weight loss.  Patient does not have any previously known cardiac history. Though he does report a remote stress test years ago. Over the past several years, dating back to at least 2015 patient has been mildly bradycardic with heart rates in the mid to upper 50s at all office visits. He has been asymptomatic with this. Over the past several weeks patient has noted progressive worsening of dyspnea on exertion, generalized fatigue, and decreased appetite. Patient initially saw PCP on 2/15 for these above complaints and was noted to have an elevated d-dimer at that time. He was advised to go to the ER however he declined that and presented to PCP on 2/27. At that time his shortness of breath was more evident. He was noted to have an oxygen saturation of 98% on room air. He was advised to go to the ER.  Upon the patient's arrival to [name] they were found to have systolic blood pressure in the 140s and heart rate in the mid 50s. He was asymptomatic. ECG nonacute as below, CXR showed no acute cardiopulmonary disease. Troponin negative 4. CBC unremarkable 2. Serum creatinine at approximate baseline of 1.1. Potassium 4.5. Recent TSH from 04/27/16 of 0.48. On telemetry patient has remained in sinus bradycardia with heart rates from the mid 40s to upper 50s. He has been asymptomatic.  Given the elevated d-dimer from 04/27/16 as above had yet to be worked up cardiology recommended CTA chest to evaluate for possible PE given the patient's shortness of breath. Internal medicine ordered CTA chest on 2/28 that was negative for PE. However, CTA chest did show aortic atherosclerosis with three-vessel coronary artery calcification. Patient is doing well at time of cardiology consult. He denies any chest pain. 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 The patient notes a 14 pound unintentional weight loss since the Christmas holiday. He notes mild early satiety. His wife also states that when the patient is depressed he does not eat as well. Patient reports he has been depressed since October 2017. IM plans to discharge to day and follow-up as an outpatient.  Past Medical History:  Diagnosis Date  . Arthritis   . Cancer Theodore [name]) 2002   melanoma right ear  . Colon polyp   . Depression   . Thyroid disease    hypothyroid  . Tremors of nervous system    Benign      Most Recent Cardiac Studies: TTE 05/10/2015: Study Conclusions  - Left ventricle: The cavity size was normal. Wall thickness was   increased increased in a pattern of mild to moderate LVH. There   was mild concentric hypertrophy. Systolic function was normal.   The estimated ejection fraction was in the range of 60% to 65%.   Wall motion was normal; there were no  regional wall motion   abnormalities. Left ventricular diastolic function parameters   were normal. - Left atrium: The atrium was normal in size. - Right ventricle: Systolic function was normal. - Pulmonary arteries: Systolic pressure was within the normal   range.   Surgical History:  Past Surgical History:  Procedure Laterality Date  . JOINT REPLACEMENT Right 2003   knee  . MELANOMA EXCISION Right     ear. Followed by Dr. Nehemiah [name]     Home Meds: Prior to Admission medications   Medication Sig Start Date End Date Taking? Authorizing Provider  cholecalciferol (VITAMIN D) 1000 UNITS tablet Take 4,000 Units by mouth daily.    Yes Historical Provider, MD  co-enzyme Q-10 30 MG capsule Take 30 mg by mouth daily.    Yes Historical Provider, MD  COD LIVER OIL PO Take by mouth.   Yes Historical Provider, MD  cyanocobalamin 500 MCG tablet Take 500 mcg by mouth daily.   Yes Historical Provider, MD  fluocinonide ointment (LIDEX) 7.25 % Apply 1 application topically 2 (two) times daily. To external ear canal for one week as needed 12/26/15  Yes Theodore Mc, MD  Iodine, Kelp, 0.15 MG TABS Take 1 tablet by mouth daily.   Yes Historical Provider, MD  levothyroxine (SYNTHROID, LEVOTHROID) 100 MCG tablet TAKE 1 TABLET (100 MCG TOTAL) BY MOUTH DAILY BEFORE BREAKFAST. 12/27/15  Yes Theodore Mc, MD  magnesium oxide (MAG-OX) 400 MG tablet Take 400 mg by mouth daily.   Yes Historical Provider, MD  OVER THE COUNTER MEDICATION Tumeric daily   Yes Historical Provider, MD  OVER THE COUNTER MEDICATION Take 3 tablets by mouth daily. Standard Process- Cardio Plus    Yes Historical Provider, MD  OVER THE COUNTER MEDICATION Standard Process- [place]   Yes Historical Provider, MD  OVER THE COUNTER MEDICATION Standard Process- Folic Acid   Yes Historical Provider, MD  OVER THE COUNTER MEDICATION Take by mouth daily. Hemp oil   Yes Historical Provider, MD  Probiotic Product (PROBIOTIC ACIDOPHILUS) CAPS Take 1 capsule by mouth daily.   Yes Historical Provider, MD  testosterone cypionate (DEPOTESTOSTERONE CYPIONATE) 200 MG/ML injection INJECT 0.25 MLS INTO THE MUSCLE EVERY 14 DAYS 05/04/16  Yes Theodore Mc, MD  vitamin E (VITAMIN E) 400 UNIT capsule Take 400 Units by mouth daily.   Yes Historical Provider, MD  ALPRAZolam (XANAX) 0.25 MG tablet Take 1 tablet (0.25 mg total) by mouth daily as needed for  anxiety. Patient not taking: Reported on 05/09/2016 01/31/16   Theodore Mc, MD  Flaxseed, Linseed, (FLAX SEEDS PO) Take by mouth.    Historical Provider, MD  Hydrogen Peroxide 30 % SOLN by Does not apply route. 5 drops per day in water (food grade)    Historical Provider, MD  meloxicam (MOBIC) 15 MG tablet Take 1 tablet (15 mg total) by mouth daily. Patient not taking: Reported on 05/09/2016 12/23/15   Theodore Mc, MD  mometasone (ELOCON) 0.1 % cream Apply 1 application topically daily. To ear canal for itching Patient not taking: Reported on 05/09/2016 02/03/15   Theodore Mc, MD  OVER THE COUNTER MEDICATION Standard PRocess- [place]    Historical Provider, MD    Inpatient Medications:  . aspirin EC  81 mg Oral Daily  . docusate sodium  100 mg Oral BID  . enoxaparin (LOVENOX) injection  40 mg Subcutaneous Q24H  . levothyroxine  100 mcg Oral QAC breakfast  . nitroGLYCERIN  0.5 inch Topical Q6H  Allergies:  Allergies  Allergen Reactions  . Penicillins Rash    Social History   Social History  . Marital status: Married    Spouse name: [name]  . Number of children: 4  . Years of education: 13   Occupational History  . Retired     Architect -    Social History Main Topics  . Smoking status: Never Smoker  . Smokeless tobacco: Never Used  . Alcohol use Yes     Comment: 1 glass of wine daily  . Drug use: No  . Sexual activity: Not Currently   Other Topics Concern  . Not on file   Social History Narrative   Mr. Rippeon grew up in [place], [place]. 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 He and his wife are living at [facility]. He is very active at [facility]. He enjoys music.      Family History  Problem Relation Age of Onset  .  Hypertension Mother   . Heart disease Mother     CHF  . Heart disease Father   . Cancer Sister     melanoma     Review of Systems: Review of Systems  Constitutional: Positive for malaise/fatigue. Negative for chills, diaphoresis, fever and weight loss.  HENT: Negative for congestion.   Eyes: Negative for discharge and redness.  Respiratory: Positive for shortness of breath. Negative for cough, hemoptysis, sputum production and wheezing.   Cardiovascular: Negative for chest pain, palpitations, orthopnea, claudication, leg swelling and PND.  Gastrointestinal: Negative for abdominal pain, blood in stool, heartburn, melena, nausea and vomiting.  Genitourinary: Negative for hematuria.  Musculoskeletal: Negative for falls and myalgias.  Skin: Negative for rash.  Neurological: Positive for weakness. Negative for dizziness, tingling, tremors, sensory change, speech change, focal weakness and loss of consciousness.  Endo/Heme/Allergies: Does not bruise/bleed easily.  Psychiatric/Behavioral: Positive for depression. Negative for substance abuse. The patient is not nervous/anxious.   All other systems reviewed and are negative.   Labs:  Recent Labs  05/09/16 1131 05/09/16 1639 05/09/16 2029 05/10/16 0236  TROPONINI <0.03 <0.03 <0.03 <0.03   Lab Results  Component Value Date   WBC 4.1 05/10/2016   HGB 15.3 05/10/2016   HCT 43.0 05/10/2016   MCV 93.3 05/10/2016   PLT 185 05/10/2016     Recent Labs Lab 05/10/16 0236  NA 140  K 4.1  CL 108  CO2 27  BUN 20  CREATININE 1.08  CALCIUM 8.6*  GLUCOSE 93   Lab Results  Component Value Date   CHOL 200 09/02/2015   HDL 46.90 09/02/2015   LDLCALC 116 (H) 09/02/2015   TRIG 189.0 (H) 09/02/2015   Lab Results  Component Value Date   DDIMER 0.62 (H) 04/27/2016    Radiology/Studies:  Dg Chest 2 View  Result Date: 05/09/2016 CLINICAL DATA:  Shortness of breath, wheezing, leg weakness for 3 weeks EXAM: CHEST  2 VIEW COMPARISON:   None. FINDINGS: Cardiomediastinal silhouette is unremarkable. No infiltrate or pleural effusion. No pulmonary edema. Mild hyperinflation. Mild degenerative changes lower thoracic spine. IMPRESSION: No active cardiopulmonary disease.  Mild hyperinflation. Electronically Signed   By: Lahoma Crocker M.D.   On: 05/09/2016 12:07   Dg Chest 2 View  Result Date: 04/27/2016 CLINICAL DATA:  Intermittent shortness of breath for 2 weeks. EXAM:  CHEST  2 VIEW COMPARISON:  None. FINDINGS: The heart, mediastinum and hila are unremarkable. Clear lungs.  No pleural effusion.  No pneumothorax. The skeletal structures are unremarkable. IMPRESSION: No active cardiopulmonary disease. Electronically Signed   By: Lajean Manes M.D.   On: 04/27/2016 13:52   Ct Angio Chest Pe W Or Wo Contrast  Result Date: 05/10/2016 CLINICAL DATA:  Shortness of breath with exertion over the last 3 weeks. Chest tightness. EXAM: CT ANGIOGRAPHY CHEST WITH CONTRAST TECHNIQUE: Multidetector CT imaging of the chest was performed using the standard protocol during bolus administration of intravenous contrast. Multiplanar CT image reconstructions and MIPs were obtained to evaluate the vascular anatomy. CONTRAST:  75 cc Isovue 370 COMPARISON:  Chest radiography 05/09/2016 FINDINGS: Cardiovascular: Pulmonary arterial opacification is excellent. No pulmonary emboli. Mild aortic atherosclerotic calcification. No aneurysm or dissection. Some coronary artery calcification is present. Mediastinum/Nodes: Normal Lungs/Pleura: The lungs are clear except for minimal atelectasis/car at both dependent lung bases. No pleural fluid. Upper Abdomen: Multiple stones filling the gallbladder, without CT evidence of cholecystitis or obstruction. Possibility of choledocholithiasis as well. Musculoskeletal: Chronic degenerative changes affect the spine. Review of the MIP images confirms the above findings. IMPRESSION: No pulmonary emboli or other acute chest pathology. Minimal  linear scarring or atelectasis at both dependent lung bases. Chololithiasis.  Possible choledocholithiasis. Aortic atherosclerosis.  Coronary artery calcification. Electronically Signed   By: Nelson Chimes M.D.   On: 05/10/2016 10:38    EKG: Interpreted by me showed: Sinus bradycardia with sinus arrhythmia, 55 BPM, no acute ST-T changes Telemetry: Interpreted by me showed: Sinus bradycardia, mid to upper 40s to upper 50s  Weights: Filed Weights   05/09/16 1123 05/09/16 2003 05/10/16 0500  Weight: 177 lb (80.3 kg) 189 lb 8 oz (86 kg) 189 lb 8 oz (86 kg)     Physical Exam: Blood pressure 134/64, pulse (!) 49, temperature 97.4 F (36.3 C), temperature source Oral, resp. rate 18, height 6\' 3"  (1.905 m), weight 189 lb 8 oz (86 kg), SpO2 96 %. Body mass index is 23.69 kg/m. General: Well developed, well nourished, in no acute distress. Head: Normocephalic, atraumatic, sclera non-icteric, no xanthomas, nares are without discharge.  Neck: Negative for carotid bruits. JVD not elevated. Lungs: Clear bilaterally to auscultation without wheezes, rales, or rhonchi. Breathing is unlabored. Heart: Bradycardic with S1 S2. No murmurs, rubs, or gallops appreciated. Abdomen: Soft, non-tender, non-distended with normoactive bowel sounds. No hepatomegaly. No rebound/guarding. No obvious abdominal masses. Msk:  Strength and tone appear normal for age. Extremities: No clubbing or cyanosis. No edema. Distal pedal pulses are 2+ and equal bilaterally. Neuro: Alert and oriented X 3. No facial asymmetry. No focal deficit. Moves all extremities spontaneously. Psych:  Responds to questions appropriately with a normal affect.    Assessment and Plan:  Principal Problem:   Bradycardia Active Problems:   Depression with anxiety   Fatigue   Hypothyroidism   Hyperlipidemia LDL goal <100   Shortness of breath    1. Sinus bradycardia: -Patient has been noted to be bradycardic since at least 2015, and has been  mostly asymptomatic throughout this time -Patient noted to be sinus bradycardia with heart rates in the mid-40s to upper 50s on telemetry while inpatient, asymptomatic -Given 3 vessel coronary artery calcification seen on CTA chest to evaluate for PE cannot rule out ischemia playing a role and his bradycardia -Will plan for outpatient Lexiscan Myoview to evaluate for high-risk ischemia -Will also have patient wear a 30 day event  monitor to evaluate for appropriate chronotropic response or arrhythmia -Potassium is adequate at 4.5, TSH recently checked as outpatient on 04/27/16 and found to be normal  2. Generalized fatigue/SOB/weight loss: -Ischemic and event monitor as above -Echo essentially normal -Patient reports 14 pound weight loss since the Christmas holiday as well as increased depression since October 2017. Patient and his wife report when the patient is depressed he does not eat very well. Recommend frequent snacking, could also consider supplementation with Ensure or Boost -Patient previously exercise 4-5 times a day by riding a stationary bike at 4.5 miles at a time he has noticed a decline in this exercise regimen, most recently exercising via walking outdoors on 2/26 for 2.5 miles -May benefit from PT  3. Hypothyroidism: -TSH normal as above -Continue replacement therapy  HLD: -Continue Lipitor   Signed, Christell Faith, PA-C [place] Pager: [phone] 05/10/2016, 12:12 PM

## 2016-05-10 NOTE — Discharge Summary (Signed)
[place] [facility] [place] NAME: Theodore [name]    MR#:  [id number]  DATE OF BIRTH:  1929-08-09  DATE OF ADMISSION:  05/09/2016 ADMITTING PHYSICIAN: Epifanio Lesches, MD  DATE OF DISCHARGE: 05/10/2016  1:21 PM  PRIMARY CARE PHYSICIAN: [name], MD    ADMISSION DIAGNOSIS:  SOB (shortness of breath) [R06.02] Exertional dyspnea [R06.09] Exertional chest pain [R07.9]  DISCHARGE DIAGNOSIS:  Principal Problem:   Bradycardia Active Problems:   Depression with anxiety   Fatigue   Hypothyroidism   Hyperlipidemia LDL goal <100   Shortness of breath   SECONDARY DIAGNOSIS:   Past Medical History:  Diagnosis Date  . Arthritis   . Cancer [facility]) 2002   melanoma right ear  . Colon polyp   . Depression   . Thyroid disease    hypothyroid  . Tremors of nervous system    Benign    HOSPITAL COURSE:   83 year old male with past medical history of osteoarthritis, skin cancer, depression, hypothyroidism, depression/anxiety who presented to the hospital due to shortness of breath and also noted to be bradycardic.  1. Shortness of breath-patient was not noted to be hypoxic. Patient was noted to have an elevated d-dimer as an outpatient, underwent a CT scan of his chest with contrast on the hospital which showed no evidence of pulmonary embolism. He was ambulated and currently is not hypoxic or complaint of shortness of breath and therefore being discharged home.  2. Sinus bradycardia-patient has been bradycardic for a few years. He is clinically usually asymptomatic. -Cardiology consult obtained, they recommend outpatient follow-up with him and/monitor and also possible functional study as an outpatient. Patient's CAT scan done that showed three-vessel Coronary Calcifications.  - pt. Will follow up with Dr. Rockey Situ in 2 weeks.   3. Hypothyroidism-patient will resume his Synthroid.  4. Anxiety-patient will resume his Xanax.  DISCHARGE CONDITIONS:    Stable  CONSULTS OBTAINED:  Treatment Team:  Minna Merritts, MD  DRUG ALLERGIES:   Allergies  Allergen Reactions  . Penicillins Rash    DISCHARGE MEDICATIONS:   Allergies as of 05/10/2016      Reactions   Penicillins Rash      Medication List    STOP taking these medications   ALPRAZolam 0.25 MG tablet Commonly known as:  XANAX   meloxicam 15 MG tablet Commonly known as:  MOBIC   mometasone 0.1 % cream Commonly known as:  ELOCON     TAKE these medications   cholecalciferol 1000 units tablet Commonly known as:  VITAMIN D Take 4,000 Units by mouth daily.   co-enzyme Q-10 30 MG capsule Take 30 mg by mouth daily.   COD LIVER OIL PO Take by mouth.   cyanocobalamin 500 MCG tablet Take 500 mcg by mouth daily.   FLAX SEEDS PO Take by mouth.   fluocinonide ointment 0.05 % Commonly known as:  LIDEX Apply 1 application topically 2 (two) times daily. To external ear canal for one week as needed   Hydrogen Peroxide 30 % Soln by Does not apply route. 5 drops per day in water (food grade)   Iodine (Kelp) 0.15 MG Tabs Take 1 tablet by mouth daily.   levothyroxine 100 MCG tablet Commonly known as:  SYNTHROID, LEVOTHROID TAKE 1 TABLET (100 MCG TOTAL) BY MOUTH DAILY BEFORE BREAKFAST.   magnesium oxide 400 MG tablet Commonly known as:  MAG-OX Take 400 mg by mouth daily.   OVER THE COUNTER MEDICATION Tumeric daily  OVER THE COUNTER MEDICATION Take 3 tablets by mouth daily. Standard Process- Cardio Plus   OVER THE COUNTER MEDICATION Standard Process- [place]   OVER THE COUNTER MEDICATION Standard Process- Folic Acid   OVER THE COUNTER MEDICATION Standard PRocess- Prosynbiotic   OVER THE COUNTER MEDICATION Take by mouth daily. Hemp oil   PROBIOTIC ACIDOPHILUS BIOBEADS Caps Take 1 capsule by mouth daily.   testosterone cypionate 200 MG/ML injection Commonly known as:  DEPOTESTOSTERONE CYPIONATE INJECT 0.25 MLS INTO THE MUSCLE EVERY 14 DAYS    vitamin E 400 UNIT capsule Generic drug:  vitamin E Take 400 Units by mouth daily.         DISCHARGE INSTRUCTIONS:   DIET:  Cardiac diet  DISCHARGE CONDITION:  Stable  ACTIVITY:  Activity as tolerated  OXYGEN:  Home Oxygen: No.   Oxygen Delivery: room air  DISCHARGE LOCATION:  home   If you experience worsening of your admission symptoms, develop shortness of breath, life threatening emergency, suicidal or homicidal thoughts you must seek medical attention immediately by calling 911 or calling your MD immediately  if symptoms less severe.  You Must read complete instructions/literature along with all the possible adverse reactions/side effects for all the Medicines you take and that have been prescribed to you. Take any new Medicines after you have completely understood and accpet all the possible adverse reactions/side effects.   Please note  You were cared for by a hospitalist during your hospital stay. If you have any questions about your discharge medications or the care you received while you were in the hospital after you are discharged, you can call the unit and asked to speak with the hospitalist on call if the hospitalist that took care of you is not available. Once you are discharged, your primary care physician will handle any further medical issues. Please note that NO REFILLS for any discharge medications will be authorized once you are discharged, as it is imperative that you return to your primary care physician (or establish a relationship with a primary care physician if you do not have one) for your aftercare needs so that they can reassess your need for medications and monitor your lab values.     Today   No chest pain, shortness of breath.  Noted to be bradycardic but hemodynamically stable.   VITAL SIGNS:  Blood pressure 134/64, pulse (!) 49, temperature 97.4 F (36.3 C), temperature source Oral, resp. rate 18, height 6\' 3"  (1.905 m), weight 86 kg  (189 lb 8 oz), SpO2 96 %.  I/O:   Intake/Output Summary (Last 24 hours) at 05/10/16 1547 Last data filed at 05/10/16 1011  Gross per 24 hour  Intake              315 ml  Output                0 ml  Net              315 ml    PHYSICAL EXAMINATION:  GENERAL:  83 y.o.-year-old patient lying in the bed in no acute distress.  EYES: Pupils equal, round, reactive to light and accommodation. No scleral icterus. Extraocular muscles intact.  HEENT: Head atraumatic, normocephalic. Oropharynx and nasopharynx clear.  NECK:  Supple, no jugular venous distention. No thyroid enlargement, no tenderness.  LUNGS: Normal breath sounds bilaterally, no wheezing, rales,rhonchi. No use of accessory muscles of respiration.  CARDIOVASCULAR: S1, S2 normal. No murmurs, rubs, or gallops.  ABDOMEN: Soft, non-tender, non-distended. Bowel sounds  present. No organomegaly or mass.  EXTREMITIES: No pedal edema, cyanosis, or clubbing.  NEUROLOGIC: Cranial nerves II through XII are intact. No focal motor or sensory defecits b/l.  PSYCHIATRIC: The patient is alert and oriented x 3.  SKIN: No obvious rash, lesion, or ulcer.   DATA REVIEW:   CBC  Recent Labs Lab 05/10/16 0236  WBC 4.1  HGB 15.3  HCT 43.0  PLT 185    Chemistries   Recent Labs Lab 05/10/16 0236  NA 140  K 4.1  CL 108  CO2 27  GLUCOSE 93  BUN 20  CREATININE 1.08  CALCIUM 8.6*    Cardiac Enzymes  Recent Labs Lab 05/10/16 0236  TROPONINI <0.03    Microbiology Results  Results for orders placed or performed during the hospital encounter of 05/09/16  MRSA PCR Screening     Status: None   Collection Time: 05/09/16  7:44 PM  Result Value Ref Range Status   MRSA by PCR NEGATIVE NEGATIVE Final    Comment:        The GeneXpert MRSA Assay (FDA approved for NASAL specimens only), is one component of a comprehensive MRSA colonization surveillance program. It is not intended to diagnose MRSA infection nor to guide or monitor  treatment for MRSA infections.     RADIOLOGY:  Dg Chest 2 View  Result Date: 05/09/2016 CLINICAL DATA:  Shortness of breath, wheezing, leg weakness for 3 weeks EXAM: CHEST  2 VIEW COMPARISON:  None. FINDINGS: Cardiomediastinal silhouette is unremarkable. No infiltrate or pleural effusion. No pulmonary edema. Mild hyperinflation. Mild degenerative changes lower thoracic spine. IMPRESSION: No active cardiopulmonary disease.  Mild hyperinflation. Electronically Signed   By: Lahoma Crocker M.D.   On: 05/09/2016 12:07   Ct Angio Chest Pe W Or Wo Contrast  Result Date: 05/10/2016 CLINICAL DATA:  Shortness of breath with exertion over the last 3 weeks. Chest tightness. EXAM: CT ANGIOGRAPHY CHEST WITH CONTRAST TECHNIQUE: Multidetector CT imaging of the chest was performed using the standard protocol during bolus administration of intravenous contrast. Multiplanar CT image reconstructions and MIPs were obtained to evaluate the vascular anatomy. CONTRAST:  75 cc Isovue 370 COMPARISON:  Chest radiography 05/09/2016 FINDINGS: Cardiovascular: Pulmonary arterial opacification is excellent. No pulmonary emboli. Mild aortic atherosclerotic calcification. No aneurysm or dissection. Some coronary artery calcification is present. Mediastinum/Nodes: Normal Lungs/Pleura: The lungs are clear except for minimal atelectasis/car at both dependent lung bases. No pleural fluid. Upper Abdomen: Multiple stones filling the gallbladder, without CT evidence of cholecystitis or obstruction. Possibility of choledocholithiasis as well. Musculoskeletal: Chronic degenerative changes affect the spine. Review of the MIP images confirms the above findings. IMPRESSION: No pulmonary emboli or other acute chest pathology. Minimal linear scarring or atelectasis at both dependent lung bases. Chololithiasis.  Possible choledocholithiasis. Aortic atherosclerosis.  Coronary artery calcification. Electronically Signed   By: Nelson Chimes M.D.   On:  05/10/2016 10:38      Management plans discussed with the patient, family and they are in agreement.  CODE STATUS:     Code Status Orders        Start     Ordered   05/09/16 1447  Full code  Continuous     05/09/16 1447    Code Status History    Date Active Date Inactive Code Status Order ID Comments User Context   This patient has a current code status but no historical code status.    Advance Directive Documentation   Flowsheet Row Most Recent Value  Type of Advance Directive  Healthcare Power of Attorney  Pre-existing out of facility DNR order (yellow form or pink MOST form)  No data  "MOST" Form in Place?  No data      TOTAL TIME TAKING CARE OF THIS PATIENT: 40 minutes.    Henreitta Leber M.D on 05/10/2016 at 3:47 PM  Between 7am to 6pm - Pager - [phone]  After 6pm go to [email or web address] - Proofreader  [facility]  Office  [phone]  CC: Primary care physician; [name], MD

## 2016-05-11 ENCOUNTER — Telehealth: Payer: Self-pay | Admitting: Cardiovascular Disease

## 2016-05-11 NOTE — Telephone Encounter (Signed)
TCM.... Pt calling stating he just discharged from hospital  He states he saw Dr Rockey Situ in hospital  He is coming to see Dr Rockey Situ on 05/19/16

## 2016-05-11 NOTE — Telephone Encounter (Signed)
Attempted to contact pt regarding discharge from [facility] on 05/11/15. Left message asking pt to call back regarding discharge instructions and/or medications. Advised pt of appt w/ Dr. Rockey Situ on 05/19/16 at 11:00 w/ [facility]. Asked pt to call back if unable to keep this appt.

## 2016-05-12 ENCOUNTER — Telehealth: Payer: Self-pay | Admitting: Cardiovascular Disease

## 2016-05-12 DIAGNOSIS — R079 Chest pain, unspecified: Secondary | ICD-10-CM

## 2016-05-12 DIAGNOSIS — R0602 Shortness of breath: Secondary | ICD-10-CM

## 2016-05-12 NOTE — Telephone Encounter (Signed)
Patient contacted regarding discharge from [facility] on 05/10/16.  Patient understands to follow up with Dr. Rockey Situ on 05/19/16 at 11:00 at [facility]. Patient understands discharge instructions? yes Patient understands medications and regiment? yes Patient understands to bring all medications to this visit? yes   Advised pt that Dr. Rockey Situ spoke w/ him in the hospital and recommended that pt have a lexiscan and Zio monitor. Scheduled lexi for 05/17/16 @ 12:30. Call transferred to scheduling to sched pt for Zio monitor placement and to move his appt out ~3 wks.

## 2016-05-16 ENCOUNTER — Encounter
Admission: RE | Admit: 2016-05-16 | Discharge: 2016-05-16 | Disposition: A | Discharge status: Home / Self Care | Payer: PPO | Source: Ambulatory Visit | Attending: Cardiovascular Disease | Admitting: Cardiovascular Disease

## 2016-05-16 DIAGNOSIS — R079 Chest pain, unspecified: Secondary | ICD-10-CM | POA: Diagnosis not present

## 2016-05-16 DIAGNOSIS — R0602 Shortness of breath: Secondary | ICD-10-CM | POA: Diagnosis not present

## 2016-05-16 LAB — NM MYOCAR MULTI W/SPECT W/WALL MOTION / EF
Estimated workload: 1 METS
LV dias vol: 49 mL (ref 62–150)
LV sys vol: 15 mL
Peak HR: 57 {beats}/min
Percent HR: 54 %
Percent of predicted max HR: 41 %
Rest HR: 50 {beats}/min
Stage 1 Grade: 0 %
Stage 1 HR: 48 {beats}/min
Stage 1 Speed: 0 mph
Stage 2 Grade: 0 %
Stage 2 HR: 48 {beats}/min
Stage 2 Speed: 0 mph
Stage 3 Grade: 0 %
Stage 3 HR: 57 {beats}/min
Stage 3 Speed: 0 mph
Stage 4 Grade: 0 %
Stage 4 HR: 72 {beats}/min
Stage 4 Speed: 0 mph
Stage 5 DBP: 62 mmHg
Stage 5 Grade: 0 %
Stage 5 HR: 70 {beats}/min
Stage 5 SBP: 120 mmHg
Stage 5 Speed: 0 mph
TID: 0.86

## 2016-05-16 MED ORDER — TECHNETIUM TC 99M TETROFOSMIN IV KIT
12.7700 | PACK | Freq: Once | INTRAVENOUS | Status: AC | PRN
  Administered 2016-05-16: 12.77 via INTRAVENOUS

## 2016-05-16 MED ORDER — REGADENOSON 0.4 MG/5ML IV SOLN
0.4000 mg | Freq: Once | INTRAVENOUS | Status: AC
  Administered 2016-05-16: 0.4 mg via INTRAVENOUS

## 2016-05-16 MED ORDER — TECHNETIUM TC 99M TETROFOSMIN IV KIT
32.6700 | PACK | Freq: Once | INTRAVENOUS | Status: AC | PRN
  Administered 2016-05-16: 32.67 via INTRAVENOUS

## 2016-05-17 ENCOUNTER — Other Ambulatory Visit: Payer: PPO

## 2016-05-18 ENCOUNTER — Other Ambulatory Visit: Payer: Self-pay | Admitting: Internal Medicine

## 2016-05-18 ENCOUNTER — Telehealth: Payer: Self-pay | Admitting: Internal Medicine

## 2016-05-18 DIAGNOSIS — I25119 Atherosclerotic heart disease of native coronary artery with unspecified angina pectoris: Secondary | ICD-10-CM

## 2016-05-18 NOTE — Telephone Encounter (Signed)
Pt recently had several labs in hospital. What else would you like ordered? Thanks!

## 2016-05-18 NOTE — Telephone Encounter (Signed)
Left detailed mess informing pt of below.  

## 2016-05-18 NOTE — Telephone Encounter (Signed)
Pt called wanting to get labs done before his appt. Need orders please and thank you!  Call pt @ [phone].

## 2016-05-18 NOTE — Telephone Encounter (Signed)
He needs fasting lipids.  Ordered

## 2016-05-19 ENCOUNTER — Encounter: Payer: PPO | Admitting: Cardiovascular Disease

## 2016-05-22 ENCOUNTER — Other Ambulatory Visit (INDEPENDENT_AMBULATORY_CARE_PROVIDER_SITE_OTHER): Payer: PPO

## 2016-05-22 DIAGNOSIS — I25119 Atherosclerotic heart disease of native coronary artery with unspecified angina pectoris: Secondary | ICD-10-CM

## 2016-05-22 LAB — COMPREHENSIVE METABOLIC PANEL
ALT: 14 U/L (ref 0–53)
AST: 17 U/L (ref 0–37)
Albumin: 4.4 g/dL (ref 3.5–5.2)
Alkaline Phosphatase: 41 U/L (ref 39–117)
BUN: 22 mg/dL (ref 6–23)
CO2: 32 mEq/L (ref 19–32)
Calcium: 9.9 mg/dL (ref 8.4–10.5)
Chloride: 103 mEq/L (ref 96–112)
Creatinine, Ser: 1.21 mg/dL (ref 0.40–1.50)
GFR: 60.58 mL/min (ref >60.00)
Glucose, Bld: 100 mg/dL — ABNORMAL HIGH (ref 70–99)
Potassium: 5.2 mEq/L — ABNORMAL HIGH (ref 3.5–5.1)
Sodium: 141 mEq/L (ref 135–145)
Total Bilirubin: 1.3 mg/dL — ABNORMAL HIGH (ref 0.2–1.2)
Total Protein: 7 g/dL (ref 6.0–8.3)

## 2016-05-22 LAB — LIPID PANEL
Cholesterol: 239 mg/dL — ABNORMAL HIGH (ref 0–200)
HDL: 59 mg/dL (ref >39.00)
LDL Cholesterol: 162 mg/dL — ABNORMAL HIGH (ref 0–99)
NonHDL: 179.83
Total CHOL/HDL Ratio: 4
Triglycerides: 89 mg/dL (ref 0.0–149.0)
VLDL: 17.8 mg/dL (ref 0.0–40.0)

## 2016-05-24 ENCOUNTER — Other Ambulatory Visit: Payer: Self-pay | Admitting: Internal Medicine

## 2016-05-24 ENCOUNTER — Encounter: Payer: Self-pay | Admitting: Internal Medicine

## 2016-05-24 DIAGNOSIS — E875 Hyperkalemia: Secondary | ICD-10-CM

## 2016-05-26 ENCOUNTER — Ambulatory Visit (INDEPENDENT_AMBULATORY_CARE_PROVIDER_SITE_OTHER): Payer: PPO

## 2016-05-26 DIAGNOSIS — R079 Chest pain, unspecified: Secondary | ICD-10-CM | POA: Diagnosis not present

## 2016-05-26 DIAGNOSIS — R0602 Shortness of breath: Secondary | ICD-10-CM | POA: Diagnosis not present

## 2016-05-29 ENCOUNTER — Other Ambulatory Visit (INDEPENDENT_AMBULATORY_CARE_PROVIDER_SITE_OTHER): Payer: PPO

## 2016-05-29 DIAGNOSIS — E875 Hyperkalemia: Secondary | ICD-10-CM | POA: Diagnosis not present

## 2016-05-29 LAB — BASIC METABOLIC PANEL
BUN: 19 mg/dL (ref 6–23)
CO2: 32 mEq/L (ref 19–32)
Calcium: 9.4 mg/dL (ref 8.4–10.5)
Chloride: 100 mEq/L (ref 96–112)
Creatinine, Ser: 1.1 mg/dL (ref 0.40–1.50)
GFR: 67.62 mL/min (ref >60.00)
Glucose, Bld: 92 mg/dL (ref 70–99)
Potassium: 4.6 mEq/L (ref 3.5–5.1)
Sodium: 137 mEq/L (ref 135–145)

## 2016-05-31 ENCOUNTER — Encounter: Payer: Self-pay | Admitting: Internal Medicine

## 2016-05-31 ENCOUNTER — Ambulatory Visit (INDEPENDENT_AMBULATORY_CARE_PROVIDER_SITE_OTHER): Payer: PPO | Admitting: Internal Medicine

## 2016-05-31 VITALS — BP 124/68 | HR 61 | Temp 98.2°F | Resp 16 | Ht 75.0 in | Wt 185.4 lb

## 2016-05-31 DIAGNOSIS — R0600 Dyspnea, unspecified: Secondary | ICD-10-CM

## 2016-05-31 DIAGNOSIS — K8020 Calculus of gallbladder without cholecystitis without obstruction: Secondary | ICD-10-CM

## 2016-05-31 DIAGNOSIS — R0609 Other forms of dyspnea: Secondary | ICD-10-CM | POA: Diagnosis not present

## 2016-05-31 DIAGNOSIS — R7989 Other specified abnormal findings of blood chemistry: Secondary | ICD-10-CM

## 2016-05-31 DIAGNOSIS — E349 Endocrine disorder, unspecified: Secondary | ICD-10-CM

## 2016-05-31 DIAGNOSIS — F418 Other specified anxiety disorders: Secondary | ICD-10-CM | POA: Diagnosis not present

## 2016-05-31 DIAGNOSIS — R0602 Shortness of breath: Secondary | ICD-10-CM

## 2016-05-31 DIAGNOSIS — R634 Abnormal weight loss: Secondary | ICD-10-CM

## 2016-05-31 DIAGNOSIS — E291 Testicular hypofunction: Secondary | ICD-10-CM | POA: Diagnosis not present

## 2016-05-31 DIAGNOSIS — R066 Hiccough: Secondary | ICD-10-CM

## 2016-05-31 NOTE — Progress Notes (Signed)
Pre visit review using our clinic review tool, if applicable. No additional management support is needed unless otherwise documented below in the visit note. 

## 2016-05-31 NOTE — Patient Instructions (Addendum)
While Dr Rockey Situ is finishing the workup on your heart, I am going to have your lung function and your gallbladder tested   I want you try 1/2 TABLET  OF ALPRAZOLAM THE NEXT TIME YOU  FEEL SHORT OF BREATH AND PANICKY   RETURN FOR A MORNING TESTOSTERONE LEVEL

## 2016-05-31 NOTE — Progress Notes (Signed)
Subjective:  Patient ID: Theodore [name], male    DOB: 1929-12-04  Age: 83 y.o. MRN: [id number]  CC: The primary encounter diagnosis was Dyspnea on exertion. Diagnoses of Gallstones, Hiccups, Hypotestosteronemia, SOB (shortness of breath), Low serum testosterone level, Weight loss, unintentional, and Depression with anxiety were also pertinent to this visit.  HPI HASHEEM VOLAND presents for follow upon recent hospitalization.   Admitted to [facility] on 2/27 for exertional dyspnea and ruled out for PE.  3 vessel disease was noted on coronaries so cardiology referral was made.  Dr Rockey Situ ordered a lexiscan which has been done and resulted as a   Low risk study:  no ischemic changes,  Hyperdynamic LV noted .     cardionet was started on march 16 .   cardiology follow up in still pending  Echo at [facility],  EF 60 to 65% mod LVH no WMA  Symptoms started at the end of January  He is frustrated bc he gets short of breath and light headed with nausea,  With minimal provocation , legs feel weak.  Has stopped singing bc of dyspnea.   Has been checking bp at home .  Hr is 61 with normal bp 120/80,  But notes that the heart rate drops when the blood pressure increases.  Feels worst when bp is normal 120/70 and pulse is 55 to 60  The episodes hit him without warning,  No diaphoresis.has to lie down for 2 hours,  Then symptoms resolve .  Has had 3 chest x rays,  All normal.     Unintentional weight loss of 12 lbs due to smaller appetite. No abd pain  But has new onset hicupps.  Just one or 2 at a time.  No change in BMs.  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  No history of "the bends" or deep diving.   Testosterone dose was reduced from previous dose , last check was normal in June   CT chest reviewed:  Gallstones,  Atherosclerosis noted.   LDL 162 addressed,  Does not trust statins       Outpatient Medications  Prior to Visit  Medication Sig Dispense Refill  . cholecalciferol (VITAMIN D) 1000 UNITS tablet Take 4,000 Units by mouth daily.     [name] co-enzyme Q-10 30 MG capsule Take 30 mg by mouth daily.     . COD LIVER OIL PO Take by mouth.    . cyanocobalamin 500 MCG tablet Take 500 mcg by mouth daily.    . Flaxseed, Linseed, (FLAX SEEDS PO) Take by mouth.    . fluocinonide ointment (LIDEX) 1.63 % Apply 1 application topically 2 (two) times daily. To external ear canal for one week as needed 30 g 1  . Iodine, Kelp, 0.15 MG TABS Take 1 tablet by mouth daily.    [name] levothyroxine (SYNTHROID, LEVOTHROID) 100 MCG tablet TAKE 1 TABLET (100 MCG TOTAL) BY MOUTH DAILY BEFORE BREAKFAST. 30 tablet 2  . magnesium oxide (MAG-OX) 400 MG tablet Take 400 mg by mouth daily.    [name] OVER THE COUNTER MEDICATION Tumeric daily    . OVER THE COUNTER MEDICATION Take 3 tablets by mouth daily. Standard Process- Cardio Plus     . OVER THE COUNTER MEDICATION Standard Process- [place]    . OVER THE COUNTER MEDICATION Standard Process- Folic Acid    . OVER  THE COUNTER MEDICATION Standard PRocess- Prosynbiotic    . OVER THE COUNTER MEDICATION Take by mouth daily. Hemp oil    . Probiotic Product (PROBIOTIC ACIDOPHILUS) CAPS Take 1 capsule by mouth daily.    [name] testosterone cypionate (DEPOTESTOSTERONE CYPIONATE) 200 MG/ML injection INJECT 0.25 MLS INTO THE MUSCLE EVERY 14 DAYS 10 mL 0  . vitamin E (VITAMIN E) 400 UNIT capsule Take 400 Units by mouth daily.    . Hydrogen Peroxide 30 % SOLN by Does not apply route. 5 drops per day in water (food grade)     No facility-administered medications prior to visit.     Review of Systems;  Patient denies headache, fevers, malaise, unintentional weight loss, skin rash, eye pain, sinus congestion and sinus pain, sore throat, dysphagia,  hemoptysis , cough, dyspnea, wheezing, chest pain, palpitations, orthopnea, edema, abdominal pain, nausea, melena, diarrhea, constipation, flank pain, dysuria,  hematuria, urinary  Frequency, nocturia, numbness, tingling, seizures,  Focal weakness, Loss of consciousness,  Tremor, insomnia, depression, anxiety, and suicidal ideation.      Objective:  BP 124/68 (BP Location: Left Arm, Patient Position: Sitting, Cuff Size: Normal)   Pulse 61   Temp 98.2 F (36.8 C) (Oral)   Resp 16   Ht 6\' 3"  (1.905 m)   Wt 185 lb 6.4 oz (84.1 kg)   SpO2 98%   BMI 23.17 kg/m   BP Readings from Last 3 Encounters:  05/31/16 124/68  05/10/16 134/64  05/09/16 90/60    Wt Readings from Last 3 Encounters:  05/31/16 185 lb 6.4 oz (84.1 kg)  05/10/16 189 lb 8 oz (86 kg)  05/09/16 184 lb 9.6 oz (83.7 kg)    General appearance: alert, cooperative and appears stated age Ears: normal TM's and external ear canals both ears Throat: lips, mucosa, and tongue normal; teeth and gums normal Neck: no adenopathy, no carotid bruit, supple, symmetrical, trachea midline and thyroid not enlarged, symmetric, no tenderness/mass/nodules Back: symmetric, no curvature. ROM normal. No CVA tenderness. Lungs: clear to auscultation bilaterally Heart: regular rate and rhythm, S1, S2 normal, no murmur, click, rub or gallop Abdomen: soft, non-tender; bowel sounds normal; no masses,  no organomegaly Pulses: 2+ and symmetric Skin: Skin color, texture, turgor normal. No rashes or lesions Lymph nodes: Cervical, supraclavicular, and axillary nodes normal.  No results found for: HGBA1C  Lab Results  Component Value Date   CREATININE 1.10 05/29/2016   CREATININE 1.21 05/22/2016   CREATININE 1.08 05/10/2016    Lab Results  Component Value Date   WBC 4.1 05/10/2016   HGB 15.3 05/10/2016   HCT 43.0 05/10/2016   PLT 185 05/10/2016   GLUCOSE 92 05/29/2016   CHOL 239 (H) 05/22/2016   TRIG 89.0 05/22/2016   HDL 59.00 05/22/2016   LDLCALC 162 (H) 05/22/2016   ALT 14 05/22/2016   AST 17 05/22/2016   NA 137 05/29/2016   K 4.6 05/29/2016   CL 100 05/29/2016   CREATININE 1.10  05/29/2016   BUN 19 05/29/2016   CO2 32 05/29/2016   TSH 0.48 04/27/2016   PSA 3.65 07/01/2014    Nm Myocar Multi W/spect W/wall Motion / Ef  Result Date: 05/16/2016  Low risk study without evidence of ischemia or scar.  The left ventricular ejection fraction is hyperdynamic (>65%).  Sensitivity and specificity of the study are degraded by extracardiac activity.     Assessment & Plan:   Problem List Items Addressed This Visit    Depression with anxiety    adding  alprazolam for panic attacks.       Low serum testosterone level    Repeat level is due      SOB (shortness of breath)    Cardiac workup underway.   Will rule out pulmonary disease with PFTs      Weight loss, unintentional    Accompanied by early satiety and new onset hiccups..  Need to rule out gastric carcinoma.  GI referral  Planned.       Other Visit Diagnoses    Dyspnea on exertion    -  Primary   Relevant Orders   Pulmonary Function Test [facility] Only   Gallstones       Hiccups       Relevant Orders   NM Hepato W/Eject Fract   Hypotestosteronemia       Relevant Orders   Testos,Total,Free and SHBG (Male)    A total of 40 minutes was spent with patient more than half of which was spent in counseling patient on the above mentioned issues , reviewing and explaining recent labs and imaging studies done, and coordination of care.  I have discontinued Mr. Duffy's Hydrogen Peroxide. I am also having him maintain his COD LIVER OIL PO, cholecalciferol, vitamin E, cyanocobalamin, magnesium oxide, co-enzyme Q-10, (Flaxseed, Linseed, (FLAX SEEDS PO)), Iodine (Kelp), OVER THE COUNTER MEDICATION, OVER THE COUNTER MEDICATION, OVER THE COUNTER MEDICATION, OVER THE COUNTER MEDICATION, OVER THE COUNTER MEDICATION, PROBIOTIC ACIDOPHILUS BIOBEADS, levothyroxine, fluocinonide ointment, testosterone cypionate, and OVER THE COUNTER MEDICATION.  No orders of the defined types were placed in this encounter.   Medications  Discontinued During This Encounter  Medication Reason  . Hydrogen Peroxide 30 % SOLN Patient has not taken in last 30 days    Follow-up: Return in about 3 months (around 08/31/2016) for FASTING APPT ASAP FOR TESTOSTERONE.   [name], MD

## 2016-06-01 ENCOUNTER — Other Ambulatory Visit (INDEPENDENT_AMBULATORY_CARE_PROVIDER_SITE_OTHER): Payer: PPO

## 2016-06-01 DIAGNOSIS — E349 Endocrine disorder, unspecified: Secondary | ICD-10-CM | POA: Diagnosis not present

## 2016-06-02 ENCOUNTER — Telehealth: Payer: Self-pay | Admitting: Internal Medicine

## 2016-06-03 DIAGNOSIS — R634 Abnormal weight loss: Secondary | ICD-10-CM | POA: Insufficient documentation

## 2016-06-03 NOTE — Assessment & Plan Note (Signed)
Accompanied by early satiety and new onset hiccups..  Need to rule out gastric carcinoma.  GI referral  Planned.

## 2016-06-03 NOTE — Assessment & Plan Note (Signed)
Cardiac workup underway.   Will rule out pulmonary disease with PFTs

## 2016-06-03 NOTE — Assessment & Plan Note (Signed)
Repeat level is due

## 2016-06-03 NOTE — Assessment & Plan Note (Signed)
adding alprazolam for panic attacks.

## 2016-06-05 LAB — TESTOS,TOTAL,FREE AND SHBG (FEMALE)
Sex Hormone Binding Glob.: 52 nmol/L (ref 22–77)
Testosterone, Free: 80.4 pg/mL (ref 30.0–135.0)
Testosterone,Total,LC/MS/MS: 687 ng/dL (ref 250–1100)

## 2016-06-06 ENCOUNTER — Ambulatory Visit: Payer: PPO | Admitting: Internal Medicine

## 2016-06-08 ENCOUNTER — Encounter: Payer: Self-pay | Admitting: Internal Medicine

## 2016-06-14 DIAGNOSIS — R42 Dizziness and giddiness: Secondary | ICD-10-CM | POA: Diagnosis not present

## 2016-06-20 ENCOUNTER — Ambulatory Visit: Payer: PPO

## 2016-06-21 ENCOUNTER — Ambulatory Visit: Payer: PPO

## 2016-06-23 ENCOUNTER — Other Ambulatory Visit: Payer: Self-pay | Admitting: *Deleted

## 2016-06-23 ENCOUNTER — Telehealth: Payer: Self-pay | Admitting: Internal Medicine

## 2016-06-23 MED ORDER — TESTOSTERONE CYPIONATE 200 MG/ML IM SOLN: INTRAMUSCULAR | 0 refills | Status: DC

## 2016-06-23 NOTE — Telephone Encounter (Signed)
Shingles does not occur on both sides of the body.  ,  Any fevers?

## 2016-06-23 NOTE — Telephone Encounter (Signed)
Patient advised of  Shingle occurs on one side of body.  Spoke with patient states no fever.    There is an opening Monday at 2:00 pm can you place patient in that opening .   Patient aware of appointment .

## 2016-06-23 NOTE — Telephone Encounter (Signed)
Pt called and stated that he and his wife has an identical rash. Pt's wife just came home from her doctor and was diagnosed with shingles. She was prescribed a 5 day treatment, he does not know what the medication is. Pt would like to know f Dr. Derrel Nip could prescribe something for him? Please advise, thank you!  Call pt @ [phone]

## 2016-06-23 NOTE — Progress Notes (Signed)
Cardiology Office Note  Date:  06/26/2016   ID:  Theodore [name], DOB 1930/02/29, MRN [id number]  PCP:  Theodore Mc, MD   Chief Complaint  Patient presents with  . other    NP. TCM- ed-to-hosp.adm. 2/27. Had Echo. Pt c/o chest tightness, sob, weakness, dizziness. Reviewed meds with pt verbally.    HPI:  74 your gentleman who lives at [facility] with notes indicating a history of asymptomatic bradycardia, prior history of depression, who presented to the hospital with episodes of shortness of breath, lightheadedness, fatigue 05/10/2016 Who presents for follow-up of his Sinus bradycardia  Tired later in the day SOB episodes/dizzy Legs weak, unsteady Sometimes nap in the afternoon Tired getting dinner together, weak Some dizziness (checked BP 643 systolic heart rate 32R) Breathing heavy in office, with talking, Has to stop and take a breath  Event monitor: PVCs,  avg rate 61 bpm No significant arrhythmia Results reviewed with him  Stop doing his exercise program, was nervous about his heart  On previous office visit wife reported he haddepression started October 2017,  significant weight loss since that time, anorexia  CT scan done in the hospital showing no significant PE contribute to shortness of breath  shows three-vessel coronary calcifications at least moderate in severity, aortic arch calcification mild to moderate  Results below reviewed with him in detail Echo 05/09/2016 - Left ventricle: The cavity size was normal. Wall thickness was  increased increased in a pattern of mild to moderate LVH. There  was mild concentric hypertrophy. Systolic function was normal.   The estimated ejection fraction was in the range of 60% to 65%.  Wall motion was normal; there were no regional wall motion abnormalities. Left ventricular diastolic function parameterswere normal. - Pulmonary arteries: Systolic pressure was within the normalrange.   Stress myoview 05/16/2016  Low risk study  without evidence of ischemia or scar.  The left ventricular ejection fraction is hyperdynamic (>65%).  Sensitivity and specificity of the study are degraded by extracardiac activity.   EKG on today's visit person the reviewed by myself showing sinus bradycardia rate 57 bpm no significant ST or T-wave changes   PMH:   has a past medical history of Arthritis; Cancer ([place]) (2002); Colon polyp; Depression; Thyroid disease; and Tremors of nervous system.  PSH:    Past Surgical History:  Procedure Laterality Date  . JOINT REPLACEMENT Right 2003   knee  . MELANOMA EXCISION Right    ear. Followed by Dr. Nehemiah [name]    Current Outpatient Prescriptions  Medication Sig Dispense Refill  . cholecalciferol (VITAMIN D) 1000 UNITS tablet Take 4,000 Units by mouth daily.     [name] co-enzyme Q-10 30 MG capsule Take 30 mg by mouth daily.     . COD LIVER OIL PO Take by mouth.    . cyanocobalamin 500 MCG tablet Take 500 mcg by mouth daily.    . fluocinonide ointment (LIDEX) 5.18 % Apply 1 application topically 2 (two) times daily. To external ear canal for one week as needed 30 g 1  . Iodine, Kelp, 0.15 MG TABS Take 1 tablet by mouth daily.    [name] levothyroxine (SYNTHROID, LEVOTHROID) 100 MCG tablet TAKE 1 TABLET (100 MCG TOTAL) BY MOUTH DAILY BEFORE BREAKFAST. 30 tablet 2  . magnesium oxide (MAG-OX) 400 MG tablet Take 400 mg by mouth daily.    [name] OVER THE COUNTER MEDICATION Tumeric daily    . OVER THE COUNTER MEDICATION Take 4 tablets by mouth 2 (two)  times daily. Standard Process- Cardio Plus     . OVER THE COUNTER MEDICATION Take 4 tablets by mouth 2 (two) times daily. Standard Process- Cataplex     . OVER THE COUNTER MEDICATION Standard Process- Folic Acid    . OVER THE COUNTER MEDICATION Take by mouth daily. Hemp oil    . Probiotic Product (PROBIOTIC ACIDOPHILUS) CAPS Take 1 capsule by mouth daily.    . RESTASIS 0.05 % ophthalmic emulsion Place 1 drop into both eyes 2 (two) times daily.    [name] testosterone  cypionate (DEPOTESTOSTERONE CYPIONATE) 200 MG/ML injection INJECT 0.25 MLS INTO THE MUSCLE EVERY 14 DAYS 10 mL 0  . UNABLE TO FIND Take 4 tablets by mouth 2 (two) times daily. Med Name: Standard process- Cyruta Plus    . UNABLE TO FIND Take 4 tablets by mouth daily. Med Name: Standard process- [place]    . vitamin E (VITAMIN E) 400 UNIT capsule Take 400 Units by mouth daily.     No current facility-administered medications for this visit.      Allergies:   Penicillins   Social History:  The patient  reports that he has never smoked. He has never used smokeless tobacco. He reports that he drinks alcohol. He reports that he does not use drugs.   Family History:   family history includes Cancer in his sister; Heart disease in his father and mother; Hypertension in his mother.    Review of Systems: Review of Systems  Constitutional: Negative.   Respiratory: Positive for shortness of breath.   Cardiovascular: Negative.   Gastrointestinal: Negative.   Musculoskeletal: Negative.   Neurological: Negative.   Psychiatric/Behavioral: Negative.   All other systems reviewed and are negative.    PHYSICAL EXAM: VS:  BP 123/70 (BP Location: Right Arm, Patient Position: Sitting, Cuff Size: Normal)   Pulse (!) 57   Ht 6\' 3"  (1.905 m)   Wt 185 lb 4 oz (84 kg)   BMI 23.15 kg/m  , BMI Body mass index is 23.15 kg/m. GEN: Well nourished, well developed, in no acute distress HEENT: normal Neck: no JVD, carotid bruits, or masses Cardiac: RRR; no murmurs, rubs, or gallops,no edema  Respiratory:  clear to auscultation bilaterally, normal work of breathing GI: soft, nontender, nondistended, + BS MS: no deformity or atrophy Skin: warm and dry, no rash Neuro:  Strength and sensation are intact Psych: euthymic mood, full affect    Recent Labs: 04/27/2016: TSH 0.48 05/10/2016: Hemoglobin 15.3; Platelets 185 05/22/2016: ALT 14 05/29/2016: BUN 19; Creatinine, Ser 1.10; Potassium 4.6; Sodium 137     Lipid Panel Lab Results  Component Value Date   CHOL 239 (H) 05/22/2016   HDL 59.00 05/22/2016   LDLCALC 162 (H) 05/22/2016   TRIG 89.0 05/22/2016      Wt Readings from Last 3 Encounters:  06/26/16 185 lb 4 oz (84 kg)  05/31/16 185 lb 6.4 oz (84.1 kg)  05/10/16 189 lb 8 oz (86 kg)       ASSESSMENT AND PLAN:  Hyperlipidemia LDL goal <100  Aortic atherosclerosis (HCC)  Coronary artery disease involving native coronary artery of native heart with angina pectoris (HCC)  Chronic fatigue  Bradycardia  Weight loss, unintentional  --- Sinus bradycardia Recent event monitor, no significant pauses, likely not causing any clinical symptoms He does have PVCs  --- Fatigue, shortness of breath Negative stress test, normal echocardiogram Possibly secondary to depression, deconditioning, anxiety Recommended he restart his exercise program at [facility]  ----Depression Dramatic  weight loss, wife reports depression since October 2017 Less active, general fatigue Recommended he restart exercise program  ---CAD As above, three-vessel coronary disease on CT scan, Stress test With no ischemia  ---PAD Mild to moderate aortic atherosclerosis seen Images reviewed by myself. Discussed with him Would aim for LDL less than 70 with statin if tolerated    Disposition:   F/U  12 months As needed  No orders of the defined types were placed in this encounter.    Signed, Esmond Plants, M.D., Ph.D. 06/26/2016  [place], [place]

## 2016-06-23 NOTE — Telephone Encounter (Signed)
Printed, signed and faxed. Pt has been notified.

## 2016-06-23 NOTE — Telephone Encounter (Signed)
Reason for call: rash Symptoms: rash armpits bilateral, abdomen, right leg started first then went spread on left leg,  circumference around it about 1 week form crust around it .  When the redness goes away it will crust scab over and gradually diminish until it goes away. The rash itches .  Patient states rash isn't painful .   He states he rash is identical to wifes .     Duration 3 weeks  Medications: Lidex helping  Patient had shingles vaccination on 04/28/2010 Wife was diagnosed with shingles  Please advise

## 2016-06-23 NOTE — Telephone Encounter (Signed)
Patient has requested a medication refill for testosterone Pharmacy [facility] [address]  Pt contact [phone]

## 2016-06-23 NOTE — Telephone Encounter (Signed)
Patient scheduled.

## 2016-06-23 NOTE — Telephone Encounter (Signed)
Refilled: 05/04/2016 Last OV: 05/31/2016 Next OV: 09/04/2016

## 2016-06-23 NOTE — Telephone Encounter (Signed)
refilled 

## 2016-06-26 ENCOUNTER — Ambulatory Visit (INDEPENDENT_AMBULATORY_CARE_PROVIDER_SITE_OTHER): Payer: PPO | Admitting: Internal Medicine

## 2016-06-26 ENCOUNTER — Ambulatory Visit (INDEPENDENT_AMBULATORY_CARE_PROVIDER_SITE_OTHER): Payer: PPO | Admitting: Cardiovascular Disease

## 2016-06-26 ENCOUNTER — Encounter: Payer: Self-pay | Admitting: Internal Medicine

## 2016-06-26 ENCOUNTER — Encounter: Payer: Self-pay | Admitting: Cardiovascular Disease

## 2016-06-26 VITALS — BP 106/64 | HR 57 | Ht 75.0 in | Wt 185.2 lb

## 2016-06-26 DIAGNOSIS — E039 Hypothyroidism, unspecified: Secondary | ICD-10-CM | POA: Diagnosis not present

## 2016-06-26 DIAGNOSIS — F418 Other specified anxiety disorders: Secondary | ICD-10-CM | POA: Diagnosis not present

## 2016-06-26 DIAGNOSIS — I700 Atherosclerosis of aorta: Secondary | ICD-10-CM | POA: Diagnosis not present

## 2016-06-26 DIAGNOSIS — B354 Tinea corporis: Secondary | ICD-10-CM

## 2016-06-26 DIAGNOSIS — E785 Hyperlipidemia, unspecified: Secondary | ICD-10-CM

## 2016-06-26 DIAGNOSIS — I25119 Atherosclerotic heart disease of native coronary artery with unspecified angina pectoris: Secondary | ICD-10-CM | POA: Diagnosis not present

## 2016-06-26 DIAGNOSIS — R7989 Other specified abnormal findings of blood chemistry: Secondary | ICD-10-CM | POA: Diagnosis not present

## 2016-06-26 DIAGNOSIS — R5382 Chronic fatigue, unspecified: Secondary | ICD-10-CM

## 2016-06-26 DIAGNOSIS — R0602 Shortness of breath: Secondary | ICD-10-CM | POA: Diagnosis not present

## 2016-06-26 DIAGNOSIS — R001 Bradycardia, unspecified: Secondary | ICD-10-CM | POA: Diagnosis not present

## 2016-06-26 DIAGNOSIS — R634 Abnormal weight loss: Secondary | ICD-10-CM | POA: Diagnosis not present

## 2016-06-26 MED ORDER — TESTOSTERONE CYPIONATE 200 MG/ML IM SOLN: INTRAMUSCULAR | 0 refills | Status: DC

## 2016-06-26 NOTE — Patient Instructions (Addendum)

## 2016-06-26 NOTE — Progress Notes (Signed)
Subjective:  Patient ID: Theodore [name], male    DOB: October 04, 1929  Age: 83 y.o. MRN: [id number]  CC: Diagnoses of Aortic atherosclerosis ([place]), Weight loss, unintentional, Acquired hypothyroidism, Low serum testosterone level, SOB (shortness of breath), and Ringworm, body were pertinent to this visit.  HPI Theodore [name] presents for evaluation painless itchy rash..  Started with a purple macular dime sized round lesion in his  Right axilla,  Resolved spontaneously . Then had one on his left thigh,  Which developed a scaling pale border before resolving.  Third one on his truck (lower abdomen) in final stages of resolution.  Wife was diagnosed with shingles,  But photo and history of her lesions contradicts diagnosis (midline , at nape of neck,  both sides of occipital scalp involved, and not painful) .  pictures of his resolved lesions suggest ringworm.  He has used a natural poultice that he learned how to  make while living in the [place] for 17 years. Cannot remember how it was made.   2) Follow up on persistent weight loss, decreased appetite, post prandial hiccups    Has not had HIDA scan yet or GI referral /   Had a  GI referral   In Nov 2017 for evaluation of dysphagia and reflux,   saw [facility]  For dysphagia .  Refused endoscopy , barium swallow ordered , was normal  Testosterone deficiency,  Using 0.25 ml every 2 weeks but constantly running out a dose early because of the small amount,  Feels very tired and weak when he misses a dose.      Completed cardiology workup by Dr. Rockey Situ: all evidence points to Normal heart .  Still short of breath and dizzy ,  Has pfts tomrorow.       Outpatient Medications Prior to Visit  Medication Sig Dispense Refill  . cholecalciferol (VITAMIN D) 1000 UNITS tablet Take 4,000 Units by mouth daily.     [name] co-enzyme Q-10 30 MG capsule Take 30 mg by mouth daily.     . COD LIVER OIL PO Take by mouth.    . cyanocobalamin 500 MCG tablet Take 500 mcg by mouth  daily.    . fluocinonide ointment (LIDEX) 4.26 % Apply 1 application topically 2 (two) times daily. To external ear canal for one week as needed 30 g 1  . Iodine, Kelp, 0.15 MG TABS Take 1 tablet by mouth daily.    [name] levothyroxine (SYNTHROID, LEVOTHROID) 100 MCG tablet TAKE 1 TABLET (100 MCG TOTAL) BY MOUTH DAILY BEFORE BREAKFAST. 30 tablet 2  . magnesium oxide (MAG-OX) 400 MG tablet Take 400 mg by mouth daily.    [name] OVER THE COUNTER MEDICATION Tumeric daily    . OVER THE COUNTER MEDICATION Take 4 tablets by mouth 2 (two) times daily. Standard Process- Cardio Plus     . OVER THE COUNTER MEDICATION Take 4 tablets by mouth 2 (two) times daily. Standard Process- Cataplex     . OVER THE COUNTER MEDICATION Standard Process- Folic Acid    . OVER THE COUNTER MEDICATION Take by mouth daily. Hemp oil    . Probiotic Product (PROBIOTIC ACIDOPHILUS) CAPS Take 1 capsule by mouth daily.    . RESTASIS 0.05 % ophthalmic emulsion Place 1 drop into both eyes 2 (two) times daily.    [name] UNABLE TO FIND Take 4 tablets by mouth 2 (two) times daily. Med Name: Standard process- Cyruta Plus    . UNABLE TO FIND Take 4 tablets  by mouth daily. Med Name: Standard process- [place]    . vitamin E (VITAMIN E) 400 UNIT capsule Take 400 Units by mouth daily.    [name] testosterone cypionate (DEPOTESTOSTERONE CYPIONATE) 200 MG/ML injection INJECT 0.25 MLS INTO THE MUSCLE EVERY 14 DAYS 10 mL 0   No facility-administered medications prior to visit.     Review of Systems;  Patient denies headache, fevers, malaise, unintentional weight loss, skin rash, eye pain, sinus congestion and sinus pain, sore throat, dysphagia,  hemoptysis , cough, dyspnea, wheezing, chest pain, palpitations, orthopnea, edema, abdominal pain, nausea, melena, diarrhea, constipation, flank pain, dysuria, hematuria, urinary  Frequency, nocturia, numbness, tingling, seizures,  Focal weakness, Loss of consciousness,  Tremor, insomnia, depression, anxiety, and suicidal  ideation.      Objective:  BP 124/68   Pulse (!) 58   Temp 98 F (36.7 C) (Oral)   Resp 15   Ht 6\' 3"  (1.905 m)   Wt 186 lb 12.8 oz (84.7 kg)   SpO2 98%   BMI 23.35 kg/m   BP Readings from Last 3 Encounters:  06/26/16 124/68  06/26/16 106/64  05/31/16 124/68    Wt Readings from Last 3 Encounters:  06/26/16 186 lb 12.8 oz (84.7 kg)  06/26/16 185 lb 4 oz (84 kg)  05/31/16 185 lb 6.4 oz (84.1 kg)    General appearance: alert, cooperative and appears stated age Ears: normal TM's and external ear canals both ears Throat: lips, mucosa, and tongue normal; teeth and gums normal Neck: no adenopathy, no carotid bruit, supple, symmetrical, trachea midline and thyroid not enlarged, symmetric, no tenderness/mass/nodules Back: symmetric, no curvature. ROM normal. No CVA tenderness. Lungs: clear to auscultation bilaterally Heart: regular rate and rhythm, S1, S2 normal, no murmur, click, rub or gallop Abdomen: soft, non-tender; bowel sounds normal; no masses,  no organomegaly Pulses: 2+ and symmetric Skin: Skin color, texture, turgor normal. No rashes or lesions Lymph nodes: Cervical, supraclavicular, and axillary nodes normal.  No results found for: HGBA1C  Lab Results  Component Value Date   CREATININE 1.10 05/29/2016   CREATININE 1.21 05/22/2016   CREATININE 1.08 05/10/2016    Lab Results  Component Value Date   WBC 4.1 05/10/2016   HGB 15.3 05/10/2016   HCT 43.0 05/10/2016   PLT 185 05/10/2016   GLUCOSE 92 05/29/2016   CHOL 239 (H) 05/22/2016   TRIG 89.0 05/22/2016   HDL 59.00 05/22/2016   LDLCALC 162 (H) 05/22/2016   ALT 14 05/22/2016   AST 17 05/22/2016   NA 137 05/29/2016   K 4.6 05/29/2016   CL 100 05/29/2016   CREATININE 1.10 05/29/2016   BUN 19 05/29/2016   CO2 32 05/29/2016   TSH 0.48 04/27/2016   PSA 3.65 07/01/2014    Nm Myocar Multi W/spect W/wall Motion / Ef  Result Date: 05/16/2016  Low risk study without evidence of ischemia or scar.  The  left ventricular ejection fraction is hyperdynamic (>65%).  Sensitivity and specificity of the study are degraded by extracardiac activity.     Assessment & Plan:   Problem List Items Addressed This Visit    Aortic atherosclerosis ([place])    He had signs of atherosclerosis in his coronaries and aorta in recent chest CT.  He has declined statin therapy but will repeat advice once his gi issues are worked out .  Lab Results  Component Value Date   CHOL 239 (H) 05/22/2016   HDL 59.00 05/22/2016   LDLCALC 162 (H) 05/22/2016   TRIG  89.0 05/22/2016   CHOLHDL 4 05/22/2016         Hypothyroidism    Thyroid function is WNL on current dose.  No current changes needed.   Lab Results  Component Value Date   TSH 0.48 04/27/2016         Low serum testosterone level    I am increasing his dose to 0. 50 ml every 2 weeks so he does not run out .  Advised him to continue using only 0. 25 ml       Ringworm, body    Currently resolved,  Will prescribe topical terbinafine ( lamisil) if recurrent. Advised to stop using steroid cream      SOB (shortness of breath)    PFTs have been ordered.  Cardiac workup negative for ischemic cardiomyopathy      Weight loss, unintentional    With post prandial hiccuping,  And cholelithiasis on recent ct Needs egd to rule out malignancy.  He will follow up with [facility]         I have changed Mr. Belkin's testosterone cypionate. I am also having him maintain his COD LIVER OIL PO, cholecalciferol, vitamin E, cyanocobalamin, magnesium oxide, co-enzyme Q-10, Iodine (Kelp), OVER THE COUNTER MEDICATION, OVER THE COUNTER MEDICATION, OVER THE COUNTER MEDICATION, OVER THE COUNTER MEDICATION, PROBIOTIC ACIDOPHILUS BIOBEADS, levothyroxine, fluocinonide ointment, OVER THE COUNTER MEDICATION, RESTASIS, UNABLE TO FIND, and UNABLE TO FIND.  Meds ordered this encounter  Medications  . testosterone cypionate (DEPOTESTOSTERONE CYPIONATE) 200 MG/ML injection    Sig:  INJECT 0.50 MLS INTO THE MUSCLE EVERY 14 DAYS    Dispense:  10 mL    Refill:  0    Medications Discontinued During This Encounter  Medication Reason  . testosterone cypionate (DEPOTESTOSTERONE CYPIONATE) 200 MG/ML injection Reorder   A total of 40 minutes was spent with patient more than half of which was spent in counseling patient on the above mentioned issues , reviewing and explaining recent labs and imaging studies done, and coordination of care. Follow-up: No Follow-up on file.   [name], MD

## 2016-06-26 NOTE — Patient Instructions (Addendum)
You do not need the HIDA scan.  You need to see Dr Angelica Chessman  for an endoscopy  To evaluate the cause of your hiccups that occur after eating   I have increased the dose of the testosterone  So that yoy can fill it early and not run so often .  Please continue to use only 0.25 ml (50 mg ) every two week   I think your rash s[pots are  due to ringworm .  Call if you have another episode  And I will call in  A treament

## 2016-06-26 NOTE — Progress Notes (Signed)
Pre visit review using our clinic review tool, if applicable. No additional management support is needed unless otherwise documented below in the visit note. 

## 2016-06-27 ENCOUNTER — Ambulatory Visit: Payer: PPO | Attending: Internal Medicine

## 2016-06-27 ENCOUNTER — Telehealth: Payer: Self-pay

## 2016-06-27 DIAGNOSIS — B354 Tinea corporis: Secondary | ICD-10-CM | POA: Insufficient documentation

## 2016-06-27 DIAGNOSIS — R0600 Dyspnea, unspecified: Secondary | ICD-10-CM | POA: Diagnosis not present

## 2016-06-27 DIAGNOSIS — R0609 Other forms of dyspnea: Secondary | ICD-10-CM | POA: Diagnosis not present

## 2016-06-27 DIAGNOSIS — R0602 Shortness of breath: Secondary | ICD-10-CM

## 2016-06-27 LAB — BLOOD GAS, ARTERIAL
Acid-Base Excess: 3.2 mmol/L — ABNORMAL HIGH (ref 0.0–2.0)
Bicarbonate: 27.9 mmol/L (ref 20.0–28.0)
FIO2: 21
O2 Saturation: 96.5 %
Patient temperature: 37
pCO2 arterial: 42 mmHg (ref 32.0–48.0)
pH, Arterial: 7.43 (ref 7.350–7.450)
pO2, Arterial: 83 mmHg (ref 83.0–108.0)

## 2016-06-27 NOTE — Assessment & Plan Note (Addendum)
I am increasing his dose to 0. 50 ml every 2 weeks so he does not run out .  Advised him to continue using only 0. 25 ml

## 2016-06-27 NOTE — Assessment & Plan Note (Addendum)
With post prandial hiccuping,  And cholelithiasis on recent ct Needs egd to rule out malignancy.  He will follow up with [facility]

## 2016-06-27 NOTE — Assessment & Plan Note (Signed)
improved with testosterone supplementation ,  He is aware of the risks of use.

## 2016-06-27 NOTE — Telephone Encounter (Signed)
Barbara from scheduling at [facility] called this morning that stated that pt came for his PFT this morning but that they couldn't do the whole thing in one day. So she stated that they rescheduled him for April 26th @ 2:00pm to do the methacholine part. Pamala Hurry stated that she needed a separate order for the methacholine section. I have put the order in it just needs to be signed off on by you.

## 2016-06-27 NOTE — Assessment & Plan Note (Addendum)
Currently resolved,  Will prescribe topical terbinafine ( lamisil) if recurrent. Advised to stop using steroid cream

## 2016-06-27 NOTE — Assessment & Plan Note (Signed)
PFTs have been ordered.  Cardiac workup negative for ischemic cardiomyopathy

## 2016-06-27 NOTE — Assessment & Plan Note (Addendum)
He had signs of atherosclerosis in his coronaries and aorta in recent chest CT.  He has declined statin therapy but will repeat advice once his gi issues are worked out .  Lab Results  Component Value Date   CHOL 239 (H) 05/22/2016   HDL 59.00 05/22/2016   LDLCALC 162 (H) 05/22/2016   TRIG 89.0 05/22/2016   CHOLHDL 4 05/22/2016

## 2016-06-27 NOTE — Assessment & Plan Note (Signed)
Thyroid function is WNL on current dose.  No current changes needed.   Lab Results  Component Value Date   TSH 0.48 04/27/2016

## 2016-06-28 NOTE — Addendum Note (Signed)
Addended by: Minna Merritts on: 06/28/2016 04:12 PM   Modules accepted: Level of Service

## 2016-06-29 ENCOUNTER — Encounter: Payer: Self-pay | Admitting: Internal Medicine

## 2016-06-29 MED ORDER — ALBUTEROL SULFATE HFA 108 (90 BASE) MCG/ACT IN AERS: 2.0000 | INHALATION_SPRAY | Freq: Four times a day (QID) | RESPIRATORY_TRACT | 2 refills | Status: DC | PRN

## 2016-06-30 ENCOUNTER — Ambulatory Visit: Payer: PPO

## 2016-07-03 ENCOUNTER — Other Ambulatory Visit: Payer: Self-pay

## 2016-07-03 ENCOUNTER — Ambulatory Visit (INDEPENDENT_AMBULATORY_CARE_PROVIDER_SITE_OTHER): Payer: PPO | Admitting: *Deleted

## 2016-07-03 DIAGNOSIS — R0602 Shortness of breath: Secondary | ICD-10-CM | POA: Diagnosis not present

## 2016-07-03 MED ORDER — LEVOTHYROXINE SODIUM 100 MCG PO TABS: 100.0000 ug | ORAL_TABLET | Freq: Every day | ORAL | 0 refills | Status: DC

## 2016-07-03 NOTE — Progress Notes (Signed)
  I have reviewed the above information and agree with above.   Teresa Tullo, MD 

## 2016-07-03 NOTE — Progress Notes (Signed)
Patient was advised in proper inhaler use according to Nursing standards , patient felt confident in use of inhaler, Patient exhaled fully and then on inspiration depressed inhaler into mouth while breathing in deeply , patient exhaled with pursed lips slowly, waiting  3 -5 seconds before initiating second puff as prescribed.    Patient 02 sats checked prior to use 96%  Pulse 58 after 2 puffs of pro -air rechecked 02 sats 99% pulse 65.

## 2016-07-05 ENCOUNTER — Telehealth: Payer: Self-pay | Admitting: *Deleted

## 2016-07-05 ENCOUNTER — Ambulatory Visit: Payer: PPO

## 2016-07-05 NOTE — Telephone Encounter (Signed)
Patient has requested a call for instructions on his inhaler  Pt contact [phone]

## 2016-07-06 ENCOUNTER — Ambulatory Visit: Payer: PPO

## 2016-07-06 ENCOUNTER — Ambulatory Visit: Payer: PPO | Attending: Internal Medicine

## 2016-07-06 DIAGNOSIS — R0602 Shortness of breath: Secondary | ICD-10-CM | POA: Insufficient documentation

## 2016-07-06 MED ORDER — METHACHOLINE 0.0625 MG/ML NEB SOLN
2.0000 mL | Freq: Once | RESPIRATORY_TRACT | Status: AC
  Administered 2016-07-06: 0.125 mg via RESPIRATORY_TRACT
  Filled 2016-07-06: qty 2

## 2016-07-06 MED ORDER — METHACHOLINE 4 MG/ML NEB SOLN
2.0000 mL | Freq: Once | RESPIRATORY_TRACT | Status: AC
  Administered 2016-07-06: 8 mg via RESPIRATORY_TRACT
  Filled 2016-07-06: qty 2

## 2016-07-06 MED ORDER — SODIUM CHLORIDE 0.9 % IN NEBU
3.0000 mL | INHALATION_SOLUTION | Freq: Once | RESPIRATORY_TRACT | Status: AC
  Administered 2016-07-06: 3 mL via RESPIRATORY_TRACT

## 2016-07-06 MED ORDER — METHACHOLINE 16 MG/ML NEB SOLN
2.0000 mL | Freq: Once | RESPIRATORY_TRACT | Status: AC
  Administered 2016-07-06: 32 mg via RESPIRATORY_TRACT
  Filled 2016-07-06: qty 2

## 2016-07-06 MED ORDER — METHACHOLINE 0.25 MG/ML NEB SOLN
2.0000 mL | Freq: Once | RESPIRATORY_TRACT | Status: AC
  Administered 2016-07-06: 0.5 mg via RESPIRATORY_TRACT
  Filled 2016-07-06: qty 2

## 2016-07-06 MED ORDER — METHACHOLINE 1 MG/ML NEB SOLN
2.0000 mL | Freq: Once | RESPIRATORY_TRACT | Status: AC
  Administered 2016-07-06: 2 mg via RESPIRATORY_TRACT
  Filled 2016-07-06: qty 2

## 2016-07-06 MED ORDER — ALBUTEROL SULFATE (2.5 MG/3ML) 0.083% IN NEBU
2.5000 mg | INHALATION_SOLUTION | Freq: Once | RESPIRATORY_TRACT | Status: AC
  Administered 2016-07-06: 2.5 mg via RESPIRATORY_TRACT

## 2016-07-06 NOTE — Telephone Encounter (Signed)
Spoke with pt and gave him the directions and instructions on how to use his inhaler. The pt gave a verbal understanding.

## 2016-07-07 DIAGNOSIS — L300 Nummular dermatitis: Secondary | ICD-10-CM | POA: Diagnosis not present

## 2016-07-09 ENCOUNTER — Other Ambulatory Visit: Payer: Self-pay | Admitting: Internal Medicine

## 2016-07-09 ENCOUNTER — Encounter: Payer: Self-pay | Admitting: Internal Medicine

## 2016-07-09 DIAGNOSIS — J449 Chronic obstructive pulmonary disease, unspecified: Secondary | ICD-10-CM

## 2016-07-24 ENCOUNTER — Other Ambulatory Visit: Payer: Self-pay | Admitting: Internal Medicine

## 2016-07-24 DIAGNOSIS — R531 Weakness: Secondary | ICD-10-CM

## 2016-07-24 NOTE — Telephone Encounter (Signed)
Pt came in the office upset this morning upset because his referral hadn't been placed yet. Asked Dr. Derrel Nip if she would order this and sign it since the pt was out front waiting on it. Took the order up front to pt and he was very upset because the referral hadn't been done yet. Explained to the pt that I was sorry for his wait and I understood that he was in pain but that Dr. Derrel Nip had been seeing patients so we had to wait on her to order the referral and sign it. The pt was still upset and stated that "something as simple as that shouldn't need the doctor". Once again explained it to the pt that a referral is something that has to be done by the physician. Pt was still upset when he left.

## 2016-07-24 NOTE — Telephone Encounter (Signed)
Patient requested a call at in reference to his referral [phone]  Cell [phone] Pt stated that PT will see him as soon as the referral is placed

## 2016-07-24 NOTE — Telephone Encounter (Signed)
Pt called requesting a new referral for PT at [facility]. He states that his right shoulder is really hurting him. He says it is muscle pain. He would like this as soon as possible. Also, he inquired about a referral for endoscopy. I do not see a referral for endoscopy. Please advise. Thank you. Pt cb [phone]

## 2016-07-24 NOTE — Telephone Encounter (Signed)
Mailed unread message to patient, thanks 

## 2016-07-25 DIAGNOSIS — M25511 Pain in right shoulder: Secondary | ICD-10-CM | POA: Diagnosis not present

## 2016-07-30 ENCOUNTER — Encounter: Payer: Self-pay | Admitting: Internal Medicine

## 2016-07-31 DIAGNOSIS — Z85820 Personal history of malignant melanoma of skin: Secondary | ICD-10-CM | POA: Diagnosis not present

## 2016-07-31 DIAGNOSIS — Z1283 Encounter for screening for malignant neoplasm of skin: Secondary | ICD-10-CM | POA: Diagnosis not present

## 2016-07-31 DIAGNOSIS — L905 Scar conditions and fibrosis of skin: Secondary | ICD-10-CM | POA: Diagnosis not present

## 2016-07-31 DIAGNOSIS — L570 Actinic keratosis: Secondary | ICD-10-CM | POA: Diagnosis not present

## 2016-07-31 DIAGNOSIS — D1800 Hemangioma unspecified site: Secondary | ICD-10-CM | POA: Diagnosis not present

## 2016-07-31 DIAGNOSIS — L821 Other seborrheic keratosis: Secondary | ICD-10-CM | POA: Diagnosis not present

## 2016-07-31 DIAGNOSIS — L814 Other melanin hyperpigmentation: Secondary | ICD-10-CM | POA: Diagnosis not present

## 2016-07-31 NOTE — Telephone Encounter (Signed)
He was seen by [facility] on 11/7. I called [facility] to follow up on the endoscopy that he mentioned. I was told that per the note there was no evidence of dysphagia so no need of an endoscopy. They will be getting a nurse to call me back regarding this.

## 2016-07-31 NOTE — Telephone Encounter (Signed)
PATIENT WAS REFERREDTO GI  IN November FOR DYSPHAGIA. SAYS HE WAS NEVER CALLED.  CAN YOU FIGURE OUT WHAT HAPPENED?

## 2016-08-01 ENCOUNTER — Encounter: Payer: Self-pay | Admitting: Internal Medicine

## 2016-08-14 ENCOUNTER — Encounter: Payer: Self-pay | Admitting: Internal Medicine

## 2016-08-14 ENCOUNTER — Ambulatory Visit (INDEPENDENT_AMBULATORY_CARE_PROVIDER_SITE_OTHER): Payer: PPO | Admitting: Internal Medicine

## 2016-08-14 VITALS — BP 108/60 | HR 68 | Resp 16 | Ht 75.0 in | Wt 185.0 lb

## 2016-08-14 DIAGNOSIS — J449 Chronic obstructive pulmonary disease, unspecified: Secondary | ICD-10-CM | POA: Diagnosis not present

## 2016-08-14 MED ORDER — PANTOPRAZOLE SODIUM 40 MG PO TBEC: 40.0000 mg | DELAYED_RELEASE_TABLET | Freq: Every day | ORAL | 3 refills | Status: DC

## 2016-08-14 MED ORDER — AZELASTINE-FLUTICASONE 137-50 MCG/ACT NA SUSP: 1.0000 | Freq: Two times a day (BID) | NASAL | 0 refills | Status: DC

## 2016-08-14 MED ORDER — BUDESONIDE-FORMOTEROL FUMARATE 160-4.5 MCG/ACT IN AERO
2.0000 | INHALATION_SPRAY | Freq: Two times a day (BID) | RESPIRATORY_TRACT | 0 refills | Status: DC
Start: 2016-08-14 — End: 2016-09-04

## 2016-08-14 NOTE — Patient Instructions (Addendum)
Start Symbicort Albuterol as needed Start Protonix Start Dymista

## 2016-08-14 NOTE — Progress Notes (Signed)
Name: Theodore [name] MRN: [id number] DOB: 1929/03/25     CONSULTATION DATE:  08/14/2016   REFERRING MD : Derrel Nip  CHIEF COMPLAINT:  SOB   HISTORY OF PRESENT ILLNESS:  83 yo white male with SOB for approx 6 months Worsening over last 2 months Has allergic rhinitis and post nasal drip symptoms daily Has occasional hiccups +resp problems as a child +frequent colds over many years +coughing a lot +wheezing occasionally Albuterol inhaler has some benefit +problems with swallowing Previous extensive cardiac work up by dr Rockey Situ was negative previous second hand smoking exposure Lived in [place] for [place] years [place]  PAST MEDICAL HISTORY :   has a past medical history of Arthritis; Cancer ([place]) (2002); Colon polyp; Depression; Thyroid disease; and Tremors of nervous system.  has a past surgical history that includes Joint replacement (Right, 2003) and Melanoma excision (Right). Prior to Admission medications   Medication Sig Start Date End Date Taking? Authorizing Provider  albuterol (PROVENTIL HFA;VENTOLIN HFA) 108 (90 Base) MCG/ACT inhaler Inhale 2 puffs into the lungs every 6 (six) hours as needed for wheezing or shortness of breath. Use with spacer 06/29/16  Yes [name], MD  cholecalciferol (VITAMIN D) 1000 UNITS tablet Take 4,000 Units by mouth daily.    Yes [provider]  co-enzyme Q-10 30 MG capsule Take 30 mg by mouth daily.    Yes [provider]  COD LIVER OIL PO Take by mouth.   Yes [provider]  cyanocobalamin 500 MCG tablet Take 500 mcg by mouth daily.   Yes [provider]  fluocinonide ointment (LIDEX) 6.73 % Apply 1 application topically 2 (two) times daily. To external ear canal for one week as needed 12/26/15  Yes [name], MD  Iodine, Kelp, 0.15 MG TABS Take 1 tablet by mouth daily.   Yes [provider]  levothyroxine (SYNTHROID, LEVOTHROID) 100 MCG tablet Take 1 tablet (100 mcg total) by mouth  daily before breakfast. 07/03/16  Yes [name], MD  magnesium oxide (MAG-OX) 400 MG tablet Take 400 mg by mouth daily.   Yes [provider]  OVER THE COUNTER MEDICATION Tumeric daily   Yes [provider]  OVER THE COUNTER MEDICATION Take 4 tablets by mouth 2 (two) times daily. Standard Process- Cardio Plus    Yes [provider]  OVER THE COUNTER MEDICATION Take 4 tablets by mouth 2 (two) times daily. Standard Process- Cataplex    Yes [provider]  OVER THE COUNTER MEDICATION Standard Process- Folic Acid   Yes [provider]  OVER THE COUNTER MEDICATION Take by mouth daily. Hemp oil   Yes [provider]  Probiotic Product (PROBIOTIC ACIDOPHILUS) CAPS Take 1 capsule by mouth daily.   Yes [provider]  RESTASIS 0.05 % ophthalmic emulsion Place 1 drop into both eyes 2 (two) times daily. 05/10/16  Yes [provider]  testosterone cypionate (DEPOTESTOSTERONE CYPIONATE) 200 MG/ML injection INJECT 0.50 MLS INTO THE MUSCLE EVERY 14 DAYS 06/26/16  Yes [name], MD  UNABLE TO FIND Take 4 tablets by mouth 2 (two) times daily. Med Name: Standard process- Cyruta Plus   Yes [provider]  UNABLE TO FIND Take 4 tablets by mouth daily. Med Name: Standard process- [place]   Yes [provider]  vitamin E (VITAMIN E) 400 UNIT capsule Take 400 Units by mouth daily.   Yes [provider]   Allergies  Allergen Reactions  . Penicillins  Rash    FAMILY HISTORY:  family history includes Cancer in his sister; Heart disease in his father and mother; Hypertension in his mother. SOCIAL HISTORY:  reports that he has never smoked. He has never used smokeless tobacco. He reports that he drinks alcohol. He reports that he does not use drugs.  REVIEW OF SYSTEMS:   Constitutional: Negative for fever, chills, weight loss, +malaise/fatigue   HENT: Negative for hearing loss, ear pain, nosebleeds,  congestion, sore throat, neck pain, tinnitus and ear discharge.   Eyes: Negative for blurred vision, double vision, photophobia, pain, discharge and redness.  Respiratory:+ cough, -hemoptysis, -sputum production, +shortness of breath, +wheezing and -stridor.   Cardiovascular: Negative for chest pain, palpitations, orthopnea, claudication, leg swelling and PND.  Gastrointestinal: Negative for heartburn, nausea, vomiting, abdominal pain, diarrhea, constipation, blood in stool and melena.  Genitourinary: Negative for dysuria, urgency, frequency, hematuria and flank pain.  Musculoskeletal: Negative for myalgias, back pain, joint pain and falls.  Skin: Negative for itching and rash.  Neurological: +dizziness, NEG tingling, tremors, sensory change, speech change, focal weakness, seizures, loss of consciousness, weakness and headaches.  Endo/Heme/Allergies: Negative for environmental allergies and polydipsia. Does not bruise/bleed easily.  ALL OTHER ROS ARE NEGATIVE   BP 108/60 (BP Location: Right Arm, Cuff Size: Normal)   Pulse 68   Resp 16   Ht 6\' 3"  (1.905 m)   Wt 185 lb (83.9 kg)   SpO2 99%   BMI 23.12 kg/m   Physical Examination:   GENERAL:NAD, no fevers, chills, no weakness no fatigue HEAD: Normocephalic, atraumatic.  EYES: Pupils equal, round, reactive to light. Extraocular muscles intact. No scleral icterus.  MOUTH: Moist mucosal membrane.   EAR, NOSE, THROAT: Clear without exudates. No external lesions.  NECK: Supple. No thyromegaly. No nodules. No JVD.  PULMONARY:CTA B/L no wheezes, no crackles, no rhonchi CARDIOVASCULAR: S1 and S2. Regular rate and rhythm. No murmurs, rubs, or gallops. No edema.  GASTROINTESTINAL: Soft, nontender, nondistended. No masses. Positive bowel sounds.  MUSCULOSKELETAL: No swelling, clubbing, or edema. Range of motion full in all extremities.  NEUROLOGIC: Cranial nerves II through XII are intact. No gross focal neurological deficits.  SKIN: No  ulceration, lesions, rashes, or cyanosis. Skin warm and dry. Turgor intact.  PSYCHIATRIC: Mood, affect within normal limits. The patient is awake, alert and oriented x 3. Insight, judgment intact.    I have Independently reviewed images of  CXR on 05/09/16   on 08/14/2016 Interpretation:no acute findings, no effusions, no opacities noted    ASSESSMENT / PLAN: 83 yo white male with SOB and DOE with abnormal PFT's c/w Mild COPD Gold Stage A in setting of allergic rhinitis and probable GERD  1.SOB and DOE from COPD -start therapy for COPD  2.COPD-Mild -will start IC/LABA with Symbicort 160 BID -albuterol as needed  3.allergic Rhinitis -srtart Dymista nasal sprays  4.probable GERD-start PPI  Follow up in 3 months   Patient/Family are satisfied with Plan of action and management. All questions answered  Corrin Parker, M.D.  [facility]  Medical Director [place] Director [facility] Cardio-Pulmonary Department

## 2016-08-24 ENCOUNTER — Telehealth: Payer: Self-pay | Admitting: Internal Medicine

## 2016-08-24 NOTE — Telephone Encounter (Signed)
Patient was seen recently and has not had any improvement with Symbicort  Patient says he is still having difficulties breathing and now has developed a hoarse throat   Please call

## 2016-08-24 NOTE — Telephone Encounter (Signed)
No improvement in breathing and states he has became hoarse since starting the Symbicort. Informed pt to hold Symbicort until he hears back on what his next step is. Please advise.

## 2016-08-24 NOTE — Telephone Encounter (Signed)
Message to patient  Please call you insurance company and obtain copy of your medication formulary   This will help your Pulmonologist to prescribe the most cost effective medications that your insurance company allows.

## 2016-08-25 NOTE — Telephone Encounter (Signed)
Spoke with pt and informed him of the message that was forwarded to DK and that I tried to find an alternative online for the Symbicort. Informed pt he can call insurance and find out from them what is covered in place of symbicort. Pt states he will call and then contact me back. Will await call.

## 2016-08-25 NOTE — Telephone Encounter (Signed)
Pt emailed me the message below in regards to meds in place of the Symbicort. Please advise.   Misty -  The following two medications are the same Te[place] 3 copay of $45 on my insurance and supposedly substation.  Terbutaline Sulfate  Stiolto Respimat   My problem with Symbacort in addition to losing my voice is that using it as directed for 10 days produced no breathing improvement.  Per your instructions, I stopped using it.  Please advise.   Thanks,  Kevan Ny    I have emailed pt back to inform him I send ing the message to DK. Will await response.

## 2016-08-25 NOTE — Telephone Encounter (Signed)
Symbicort is covered but no alternatives given. Please advise on a different medication.

## 2016-08-25 NOTE — Telephone Encounter (Signed)
Pt is calling to see status of his phone call yesterday.

## 2016-08-28 MED ORDER — TIOTROPIUM BROMIDE-OLODATEROL 2.5-2.5 MCG/ACT IN AERS: 2.0000 | INHALATION_SPRAY | Freq: Every day | RESPIRATORY_TRACT | 3 refills | Status: DC

## 2016-08-28 NOTE — Telephone Encounter (Signed)
Stiolto Respimat please

## 2016-08-28 NOTE — Telephone Encounter (Signed)
Pt informed that Stiolto sent. Nothing further needed.

## 2016-08-29 DIAGNOSIS — R634 Abnormal weight loss: Secondary | ICD-10-CM | POA: Diagnosis not present

## 2016-08-29 DIAGNOSIS — R1310 Dysphagia, unspecified: Secondary | ICD-10-CM | POA: Diagnosis not present

## 2016-09-04 ENCOUNTER — Ambulatory Visit (INDEPENDENT_AMBULATORY_CARE_PROVIDER_SITE_OTHER): Payer: PPO | Admitting: Internal Medicine

## 2016-09-04 ENCOUNTER — Encounter: Payer: Self-pay | Admitting: Internal Medicine

## 2016-09-04 VITALS — BP 136/54 | HR 51 | Temp 97.8°F | Resp 17 | Ht 75.0 in | Wt 185.6 lb

## 2016-09-04 DIAGNOSIS — Z79899 Other long term (current) drug therapy: Secondary | ICD-10-CM

## 2016-09-04 DIAGNOSIS — I700 Atherosclerosis of aorta: Secondary | ICD-10-CM | POA: Diagnosis not present

## 2016-09-04 DIAGNOSIS — J4530 Mild persistent asthma, uncomplicated: Secondary | ICD-10-CM

## 2016-09-04 DIAGNOSIS — R7989 Other specified abnormal findings of blood chemistry: Secondary | ICD-10-CM | POA: Diagnosis not present

## 2016-09-04 DIAGNOSIS — E039 Hypothyroidism, unspecified: Secondary | ICD-10-CM | POA: Diagnosis not present

## 2016-09-04 DIAGNOSIS — R1310 Dysphagia, unspecified: Secondary | ICD-10-CM

## 2016-09-04 DIAGNOSIS — R1319 Other dysphagia: Secondary | ICD-10-CM

## 2016-09-04 NOTE — Patient Instructions (Signed)
I'm glad you are feeling better  I would like to check your testosterone level along with other fasting labs around 7 days after your next injection

## 2016-09-04 NOTE — Progress Notes (Signed)
Subjective:  Patient ID: Theodore [name], male    DOB: 09-12-1929  Age: 83 y.o. MRN: [id number]  CC: The primary encounter diagnosis was Aortic atherosclerosis ([place]). Diagnoses of Acquired hypothyroidism, Long-term use of high-risk medication, Low serum testosterone level, Mild persistent asthma without complication, and Esophageal dysphagia were also pertinent to this visit.  HPI Theodore [name] presents for follow up on  Multiple issues including non cardiac fatigue and dyspnea with cough , as well as chronic dysphagia,    PFTS suggested reversible small airways disease . Theodore [name] on June 4   Started on Symbicort and PPI for probable GERD  As a cause for his chronic cough. . Did not start Protonix   Stopped symbicort after 12 days because he lost his voice and had no relief  From his dyspnea and cough .  Notified Theodore [name];s office  Stiolto prescribed but not started.  Patient started using a natural nebulized liquid called Respiratory Relief ( elemental silver 135 mcg , peppermint oil,  And deonized water ) used it 3 times daily initially, then reduce nebs to twice daily.  Feels significantly better. Does not want to resume any traditional pulmonary meds.  Doesn't see Theodore [name] until September    Chronic dysphagia:  saw GI again after deferring endoscopy in November. Endoscopy is scheduled for Nov 2018!  Still coughing up minute amounts of food products after eating .   he has had DG esophagus and a swallow evaluation  [place] done since November   testosterone deficiency :   Pharmacy has only been filling 1 ml per month due to controlled substance 49foudn otu by calling the pharmacy) .  He has been using 0.25 mg so the bottle is lasting him  2 months worth of medications (4 doses)  . Frustrated,  But feels better.     Outpatient Medications Prior to Visit  Medication Sig Dispense Refill  . ALPRAZolam (XANAX) 0.25 MG tablet Take 0.25 mg by mouth daily as needed. for anxiety  1  . Azelastine-Fluticasone  (DYMISTA) 137-50 MCG/ACT SUSP Place 1 spray into the nose 2 (two) times daily. 23 g 0  . cholecalciferol (VITAMIN D) 1000 UNITS tablet Take 4,000 Units by mouth daily.     [name] co-enzyme Q-10 30 MG capsule Take 30 mg by mouth daily.     . COD LIVER OIL PO Take by mouth.    . cyanocobalamin 500 MCG tablet Take 500 mcg by mouth daily.    . fluocinonide ointment (LIDEX) 1.47 % Apply 1 application topically 2 (two) times daily. To external ear canal for one week as needed 30 g 1  . Iodine, Kelp, 0.15 MG TABS Take 1 tablet by mouth daily.    [name] levothyroxine (SYNTHROID, LEVOTHROID) 100 MCG tablet Take 1 tablet (100 mcg total) by mouth daily before breakfast. 90 tablet 0  . magnesium oxide (MAG-OX) 400 MG tablet Take 400 mg by mouth daily.    [name] OVER THE COUNTER MEDICATION Tumeric daily    . OVER THE COUNTER MEDICATION Take 4 tablets by mouth 2 (two) times daily. Standard Process- Cardio Plus     . OVER THE COUNTER MEDICATION Take 4 tablets by mouth 2 (two) times daily. Standard Process- Cataplex     . OVER THE COUNTER MEDICATION Standard Process- Folic Acid    . OVER THE COUNTER MEDICATION Take by mouth daily. Hemp oil    . Probiotic Product (PROBIOTIC ACIDOPHILUS) CAPS Take 1 capsule by mouth daily.    [name]  RESTASIS 0.05 % ophthalmic emulsion Place 1 drop into both eyes 2 (two) times daily.    [name] UNABLE TO FIND Take 4 tablets by mouth 2 (two) times daily. Med Name: Standard process- Cyruta Plus    . vitamin E (VITAMIN E) 400 UNIT capsule Take 400 Units by mouth daily.    [name] testosterone cypionate (DEPOTESTOSTERONE CYPIONATE) 200 MG/ML injection INJECT 0.50 MLS INTO THE MUSCLE EVERY 14 DAYS 10 mL 0  . albuterol (PROVENTIL HFA;VENTOLIN HFA) 108 (90 Base) MCG/ACT inhaler Inhale 2 puffs into the lungs every 6 (six) hours as needed for wheezing or shortness of breath. Use with spacer (Patient not taking: Reported on 09/04/2016) 1 Inhaler 2  . budesonide-formoterol (SYMBICORT) 160-4.5 MCG/ACT inhaler Inhale 2 puffs  into the lungs 2 (two) times daily. (Patient not taking: Reported on 09/04/2016) 1 Inhaler 0  . pantoprazole (PROTONIX) 40 MG tablet Take 1 tablet (40 mg total) by mouth daily. (Patient not taking: Reported on 09/04/2016) 30 tablet 3  . Tiotropium Bromide-Olodaterol (STIOLTO RESPIMAT) 2.5-2.5 MCG/ACT AERS Inhale 2 puffs into the lungs daily. (Patient not taking: Reported on 09/04/2016) 4 g 3   No facility-administered medications prior to visit.     Review of Systems;  Patient denies headache, fevers, malaise, unintentional weight loss, skin rash, eye pain, sinus congestion and sinus pain, sore throat, dysphagia,  hemoptysis , cough, dyspnea, wheezing, chest pain, palpitations, orthopnea, edema, abdominal pain, nausea, melena, diarrhea, constipation, flank pain, dysuria, hematuria, urinary  Frequency, nocturia, numbness, tingling, seizures,  Focal weakness, Loss of consciousness,  Tremor, insomnia, depression, anxiety, and suicidal ideation.      Objective:  BP (!) 136/54 (BP Location: Left Arm, Patient Position: Sitting, Cuff Size: Normal)   Pulse (!) 51   Temp 97.8 F (36.6 C) (Oral)   Resp 17   Ht 6\' 3"  (1.905 m)   Wt 185 lb 9.6 oz (84.2 kg)   SpO2 97%   BMI 23.20 kg/m   BP Readings from Last 3 Encounters:  09/04/16 (!) 136/54  08/14/16 108/60  06/26/16 124/68    Wt Readings from Last 3 Encounters:  09/04/16 185 lb 9.6 oz (84.2 kg)  08/14/16 185 lb (83.9 kg)  06/26/16 186 lb 12.8 oz (84.7 kg)    General appearance: alert, cooperative and appears stated age Ears: normal TM's and external ear canals both ears Throat: lips, mucosa, and tongue normal; teeth and gums normal Neck: no adenopathy, no carotid bruit, supple, symmetrical, trachea midline and thyroid not enlarged, symmetric, no tenderness/mass/nodules Back: symmetric, no curvature. ROM normal. No CVA tenderness. Lungs: clear to auscultation bilaterally Heart: regular rate and rhythm, S1, S2 normal, no murmur, click,  rub or gallop Abdomen: soft, non-tender; bowel sounds normal; no masses,  no organomegaly Pulses: 2+ and symmetric Skin: Skin color, texture, turgor normal. No rashes or lesions Lymph nodes: Cervical, supraclavicular, and axillary nodes normal.  No results found for: HGBA1C  Lab Results  Component Value Date   CREATININE 1.10 05/29/2016   CREATININE 1.21 05/22/2016   CREATININE 1.08 05/10/2016    Lab Results  Component Value Date   WBC 4.1 05/10/2016   HGB 15.3 05/10/2016   HCT 43.0 05/10/2016   PLT 185 05/10/2016   GLUCOSE 92 05/29/2016   CHOL 239 (H) 05/22/2016   TRIG 89.0 05/22/2016   HDL 59.00 05/22/2016   LDLCALC 162 (H) 05/22/2016   ALT 14 05/22/2016   AST 17 05/22/2016   NA 137 05/29/2016   K 4.6 05/29/2016   CL  100 05/29/2016   CREATININE 1.10 05/29/2016   BUN 19 05/29/2016   CO2 32 05/29/2016   TSH 0.48 04/27/2016   PSA 3.65 07/01/2014    Nm Myocar Multi W/spect W/wall Motion / Ef  Result Date: 05/16/2016  Low risk study without evidence of ischemia or scar.  The left ventricular ejection fraction is hyperdynamic (>65%).  Sensitivity and specificity of the study are degraded by extracardiac activity.     Assessment & Plan:   Problem List Items Addressed This Visit    Low serum testosterone level    With poor quality of life when testsoterone supplementation is suspended.  Continue 0.25 ml every 2 weeks pending next level  Mid trough      Long-term use of high-risk medication   Relevant Orders   Testos,Total,Free and SHBG (Male)   Comprehensive metabolic panel   CBC with Differential/Platelet   Hypothyroidism    Thyroid function is WNL on current dose.  No current changes needed.   Lab Results  Component Value Date   TSH 0.48 04/27/2016         Relevant Orders   TSH   Dysphagia    For EGD im July.  DG esophagus was normal,  Functional swallow eval reportedly normal as well.  Advised to moisten each morsel of food prior to swallowing.         Asthma    Suggested  by PFTs .did not tolerate ICS/LABA.  Symptoms have resolved uing a nebulized homeopathic solution containing silver .  Advised to discuss with dr Mortimer Fries.       Aortic atherosclerosis ([place]) - Primary   Relevant Orders   Lipid panel     A total of  25 minutes was spent with patient more than half of which was spent in counseling patient on the above mentioned issues , reviewing and explaining recent labs and imaging studies done, and coordination of care. I have discontinued Mr. Rogalski's albuterol, budesonide-formoterol, pantoprazole, and Tiotropium Bromide-Olodaterol. I am also having him maintain his COD LIVER OIL PO, cholecalciferol, vitamin E, cyanocobalamin, magnesium oxide, co-enzyme Q-10, Iodine (Kelp), OVER THE COUNTER MEDICATION, OVER THE COUNTER MEDICATION, OVER THE COUNTER MEDICATION, OVER THE COUNTER MEDICATION, PROBIOTIC ACIDOPHILUS BIOBEADS, fluocinonide ointment, OVER THE COUNTER MEDICATION, RESTASIS, UNABLE TO FIND, levothyroxine, ALPRAZolam, and Azelastine-Fluticasone.  No orders of the defined types were placed in this encounter.   Medications Discontinued During This Encounter  Medication Reason  . albuterol (PROVENTIL HFA;VENTOLIN HFA) 108 (90 Base) MCG/ACT inhaler Patient has not taken in last 30 days  . budesonide-formoterol (SYMBICORT) 160-4.5 MCG/ACT inhaler Patient has not taken in last 30 days  . pantoprazole (PROTONIX) 40 MG tablet Patient has not taken in last 30 days  . Tiotropium Bromide-Olodaterol (STIOLTO RESPIMAT) 2.5-2.5 MCG/ACT AERS Patient has not taken in last 30 days    Follow-up: Return in about 6 months (around 03/06/2017) for FASTING LABS 10 DAYS ROUGHLY .   [name], MD

## 2016-09-05 ENCOUNTER — Telehealth: Payer: Self-pay

## 2016-09-05 DIAGNOSIS — R1310 Dysphagia, unspecified: Secondary | ICD-10-CM | POA: Insufficient documentation

## 2016-09-05 MED ORDER — TESTOSTERONE CYPIONATE 200 MG/ML IM SOLN: INTRAMUSCULAR | 5 refills | Status: DC

## 2016-09-05 NOTE — Telephone Encounter (Signed)
Spoke with pt's pharmacy and they stated that the pt is getting a 74ml vial and according to the rx he should be using half the bottle every 14 days.

## 2016-09-05 NOTE — Telephone Encounter (Signed)
LMTCB

## 2016-09-05 NOTE — Assessment & Plan Note (Addendum)
Suggested  by PFTs .did not tolerate ICS/LABA.  Symptoms have resolved uing a nebulized homeopathic solution containing silver .  Advised to discuss with dr Mortimer Fries.

## 2016-09-05 NOTE — Telephone Encounter (Signed)
As them why they are giving him a 1 ml vial when I wrote for a 10 ml vial.

## 2016-09-05 NOTE — Assessment & Plan Note (Signed)
For EGD im July.  DG esophagus was normal,  Functional swallow eval reportedly normal as well.  Advised to moisten each morsel of food prior to swallowing.

## 2016-09-05 NOTE — Assessment & Plan Note (Signed)
With poor quality of life when testsoterone supplementation is suspended.  Continue 0.25 ml every 2 weeks pending next level  Mid trough 

## 2016-09-05 NOTE — Assessment & Plan Note (Signed)
Thyroid function is WNL on current dose.  No current changes needed.   Lab Results  Component Value Date   TSH 0.48 04/27/2016

## 2016-09-05 NOTE — Telephone Encounter (Signed)
Ok , I have changed the prescription,  Let patiient know why. Continue 0.25 ml as the biweekly dose until we check his next testosterone level

## 2016-09-05 NOTE — Telephone Encounter (Signed)
Pharmacy stated that the patients insurance will only cover a 30 day supply at a time.

## 2016-09-06 NOTE — Telephone Encounter (Signed)
Patient has been notified

## 2016-09-15 ENCOUNTER — Telehealth: Payer: Self-pay | Admitting: Radiology

## 2016-09-15 ENCOUNTER — Other Ambulatory Visit: Payer: Self-pay | Admitting: Radiology

## 2016-09-15 DIAGNOSIS — R7989 Other specified abnormal findings of blood chemistry: Secondary | ICD-10-CM

## 2016-09-15 DIAGNOSIS — Z79899 Other long term (current) drug therapy: Secondary | ICD-10-CM

## 2016-09-15 DIAGNOSIS — E039 Hypothyroidism, unspecified: Secondary | ICD-10-CM

## 2016-09-15 DIAGNOSIS — E785 Hyperlipidemia, unspecified: Secondary | ICD-10-CM

## 2016-09-15 NOTE — Telephone Encounter (Signed)
Done

## 2016-09-15 NOTE — Telephone Encounter (Signed)
Yes please place reorder as future orders. Orders can't be released under lab visit due to error saying orders expire on 09/04/16.

## 2016-09-15 NOTE — Telephone Encounter (Signed)
Pt coming in for labs Monday, please place future orders. Thank you 

## 2016-09-15 NOTE — Addendum Note (Signed)
Addended by: [name] on: 09/15/2016 02:33 PM   Modules accepted: Orders

## 2016-09-15 NOTE — Telephone Encounter (Signed)
They were placed on June 25 during visit .  Let me know if they have to be re entered

## 2016-09-18 ENCOUNTER — Other Ambulatory Visit (INDEPENDENT_AMBULATORY_CARE_PROVIDER_SITE_OTHER): Payer: PPO

## 2016-09-18 ENCOUNTER — Telehealth: Payer: Self-pay | Admitting: Internal Medicine

## 2016-09-18 DIAGNOSIS — E785 Hyperlipidemia, unspecified: Secondary | ICD-10-CM | POA: Diagnosis not present

## 2016-09-18 DIAGNOSIS — E039 Hypothyroidism, unspecified: Secondary | ICD-10-CM | POA: Diagnosis not present

## 2016-09-18 DIAGNOSIS — R7989 Other specified abnormal findings of blood chemistry: Secondary | ICD-10-CM | POA: Diagnosis not present

## 2016-09-18 DIAGNOSIS — Z79899 Other long term (current) drug therapy: Secondary | ICD-10-CM | POA: Diagnosis not present

## 2016-09-18 LAB — COMPREHENSIVE METABOLIC PANEL
ALT: 13 U/L (ref 0–53)
AST: 17 U/L (ref 0–37)
Albumin: 4.1 g/dL (ref 3.5–5.2)
Alkaline Phosphatase: 43 U/L (ref 39–117)
BUN: 19 mg/dL (ref 6–23)
CO2: 31 mEq/L (ref 19–32)
Calcium: 9.1 mg/dL (ref 8.4–10.5)
Chloride: 106 mEq/L (ref 96–112)
Creatinine, Ser: 1.19 mg/dL (ref 0.40–1.50)
GFR: 61.71 mL/min (ref >60.00)
Glucose, Bld: 96 mg/dL (ref 70–99)
Potassium: 4.9 mEq/L (ref 3.5–5.1)
Sodium: 141 mEq/L (ref 135–145)
Total Bilirubin: 1.2 mg/dL (ref 0.2–1.2)
Total Protein: 6.4 g/dL (ref 6.0–8.3)

## 2016-09-18 LAB — CBC WITH DIFFERENTIAL/PLATELET
Basophils Absolute: 0 10*3/uL (ref 0.0–0.1)
Basophils Relative: 0.8 % (ref 0.0–3.0)
Eosinophils Absolute: 0.5 10*3/uL (ref 0.0–0.7)
Eosinophils Relative: 10.3 % — ABNORMAL HIGH (ref 0.0–5.0)
HCT: 45.7 % (ref 39.0–52.0)
Hemoglobin: 16 g/dL (ref 13.0–17.0)
Lymphocytes Relative: 29.6 % (ref 12.0–46.0)
Lymphs Abs: 1.3 10*3/uL (ref 0.7–4.0)
MCHC: 35.1 g/dL (ref 30.0–36.0)
MCV: 94 fl (ref 78.0–100.0)
Monocytes Absolute: 0.4 10*3/uL (ref 0.1–1.0)
Monocytes Relative: 9 % (ref 3.0–12.0)
Neutro Abs: 2.2 10*3/uL (ref 1.4–7.7)
Neutrophils Relative %: 50.3 % (ref 43.0–77.0)
Platelets: 223 10*3/uL (ref 150.0–400.0)
RBC: 4.86 Mil/uL (ref 4.22–5.81)
RDW: 13.3 % (ref 11.5–15.5)
WBC: 4.4 10*3/uL (ref 4.0–10.5)

## 2016-09-18 LAB — LIPID PANEL
Cholesterol: 216 mg/dL — ABNORMAL HIGH (ref 0–200)
HDL: 50.7 mg/dL (ref >39.00)
LDL Cholesterol: 150 mg/dL — ABNORMAL HIGH (ref 0–99)
NonHDL: 165.07
Total CHOL/HDL Ratio: 4
Triglycerides: 76 mg/dL (ref 0.0–149.0)
VLDL: 15.2 mg/dL (ref 0.0–40.0)

## 2016-09-18 LAB — TSH: TSH: 1.13 u[IU]/mL (ref 0.35–4.50)

## 2016-09-18 NOTE — Telephone Encounter (Signed)
Placed in red folder  

## 2016-09-18 NOTE — Telephone Encounter (Signed)
Pt has a copy of his visit with with Dr Nevin Bloodgood DrTullo requested. It's in her folder upfront. Thank you!

## 2016-09-21 ENCOUNTER — Encounter: Payer: Self-pay | Admitting: Internal Medicine

## 2016-09-23 LAB — TESTOS,TOTAL,FREE AND SHBG (FEMALE)
Sex Hormone Binding Glob.: 58 nmol/L (ref 22–77)
Testosterone, Free: 38.3 pg/mL (ref 30.0–135.0)
Testosterone,Total,LC/MS/MS: 425 ng/dL (ref 250–1100)

## 2016-09-24 ENCOUNTER — Encounter: Payer: Self-pay | Admitting: Internal Medicine

## 2016-09-28 DIAGNOSIS — M25562 Pain in left knee: Secondary | ICD-10-CM | POA: Diagnosis not present

## 2016-10-04 ENCOUNTER — Other Ambulatory Visit: Payer: Self-pay | Admitting: Internal Medicine

## 2016-10-05 NOTE — Telephone Encounter (Signed)
Mailed unread message to patient.  

## 2016-10-09 NOTE — Telephone Encounter (Signed)
Mailed unread message to patient. thanks 

## 2016-10-30 ENCOUNTER — Encounter: Payer: Self-pay | Admitting: *Deleted

## 2016-10-31 ENCOUNTER — Ambulatory Visit: Payer: PPO | Admitting: Anesthesiology

## 2016-10-31 ENCOUNTER — Ambulatory Visit
Admission: RE | Admit: 2016-10-31 | Discharge: 2016-10-31 | Disposition: A | Discharge status: Home / Self Care | Payer: PPO | Source: Ambulatory Visit | Attending: Gastroenterology | Admitting: Gastroenterology

## 2016-10-31 ENCOUNTER — Encounter
Admission: RE | Disposition: A | Discharge status: Home / Self Care | Payer: Self-pay | Source: Ambulatory Visit | Attending: Gastroenterology

## 2016-10-31 DIAGNOSIS — Z87442 Personal history of urinary calculi: Secondary | ICD-10-CM | POA: Diagnosis not present

## 2016-10-31 DIAGNOSIS — K2970 Gastritis, unspecified, without bleeding: Secondary | ICD-10-CM | POA: Diagnosis not present

## 2016-10-31 DIAGNOSIS — J449 Chronic obstructive pulmonary disease, unspecified: Secondary | ICD-10-CM | POA: Insufficient documentation

## 2016-10-31 DIAGNOSIS — K2950 Unspecified chronic gastritis without bleeding: Secondary | ICD-10-CM | POA: Diagnosis not present

## 2016-10-31 DIAGNOSIS — K224 Dyskinesia of esophagus: Secondary | ICD-10-CM | POA: Insufficient documentation

## 2016-10-31 DIAGNOSIS — K2930 Chronic superficial gastritis without bleeding: Secondary | ICD-10-CM | POA: Diagnosis not present

## 2016-10-31 DIAGNOSIS — R1310 Dysphagia, unspecified: Secondary | ICD-10-CM | POA: Diagnosis present

## 2016-10-31 DIAGNOSIS — K219 Gastro-esophageal reflux disease without esophagitis: Secondary | ICD-10-CM | POA: Insufficient documentation

## 2016-10-31 DIAGNOSIS — K210 Gastro-esophageal reflux disease with esophagitis: Secondary | ICD-10-CM | POA: Diagnosis not present

## 2016-10-31 DIAGNOSIS — Z79899 Other long term (current) drug therapy: Secondary | ICD-10-CM | POA: Diagnosis not present

## 2016-10-31 DIAGNOSIS — Z85820 Personal history of malignant melanoma of skin: Secondary | ICD-10-CM | POA: Insufficient documentation

## 2016-10-31 DIAGNOSIS — E039 Hypothyroidism, unspecified: Secondary | ICD-10-CM | POA: Diagnosis not present

## 2016-10-31 DIAGNOSIS — F329 Major depressive disorder, single episode, unspecified: Secondary | ICD-10-CM | POA: Diagnosis not present

## 2016-10-31 DIAGNOSIS — I2510 Atherosclerotic heart disease of native coronary artery without angina pectoris: Secondary | ICD-10-CM | POA: Diagnosis not present

## 2016-10-31 DIAGNOSIS — K449 Diaphragmatic hernia without obstruction or gangrene: Secondary | ICD-10-CM | POA: Diagnosis not present

## 2016-10-31 DIAGNOSIS — K228 Other specified diseases of esophagus: Secondary | ICD-10-CM | POA: Diagnosis not present

## 2016-10-31 DIAGNOSIS — R1313 Dysphagia, pharyngeal phase: Secondary | ICD-10-CM | POA: Insufficient documentation

## 2016-10-31 DIAGNOSIS — Z880 Allergy status to penicillin: Secondary | ICD-10-CM | POA: Diagnosis not present

## 2016-10-31 HISTORY — DX: Personal history of urinary calculi: Z87.442

## 2016-10-31 HISTORY — DX: Chronic rhinitis: J31.0

## 2016-10-31 HISTORY — PX: ESOPHAGOGASTRODUODENOSCOPY (EGD) WITH PROPOFOL: SHX5813

## 2016-10-31 HISTORY — DX: Chronic obstructive pulmonary disease, unspecified: J44.9

## 2016-10-31 HISTORY — DX: Hypothyroidism, unspecified: E03.9

## 2016-10-31 SURGERY — ESOPHAGOGASTRODUODENOSCOPY (EGD) WITH PROPOFOL
Anesthesia: General

## 2016-10-31 MED ORDER — FENTANYL CITRATE (PF) 100 MCG/2ML IJ SOLN
INTRAMUSCULAR | Status: AC
  Filled 2016-10-31: qty 2

## 2016-10-31 MED ORDER — MIDAZOLAM HCL 2 MG/2ML IJ SOLN
INTRAMUSCULAR | Status: AC
  Filled 2016-10-31: qty 2

## 2016-10-31 MED ORDER — LIDOCAINE 2% (20 MG/ML) 5 ML SYRINGE
INTRAMUSCULAR | Status: DC | PRN
  Administered 2016-10-31: 40 mg via INTRAVENOUS

## 2016-10-31 MED ORDER — FENTANYL CITRATE (PF) 100 MCG/2ML IJ SOLN
INTRAMUSCULAR | Status: DC | PRN
  Administered 2016-10-31: 50 ug via INTRAVENOUS

## 2016-10-31 MED ORDER — MIDAZOLAM HCL 5 MG/5ML IJ SOLN
INTRAMUSCULAR | Status: DC | PRN
  Administered 2016-10-31: 1 mg via INTRAVENOUS

## 2016-10-31 MED ORDER — PROPOFOL 500 MG/50ML IV EMUL
INTRAVENOUS | Status: AC
  Filled 2016-10-31: qty 50

## 2016-10-31 MED ORDER — SODIUM CHLORIDE 0.9 % IV SOLN
INTRAVENOUS | Status: DC
  Administered 2016-10-31 (×2): via INTRAVENOUS

## 2016-10-31 MED ORDER — PROPOFOL 500 MG/50ML IV EMUL
INTRAVENOUS | Status: DC | PRN
  Administered 2016-10-31: 140 ug/kg/min via INTRAVENOUS

## 2016-10-31 MED ORDER — PROPOFOL 10 MG/ML IV BOLUS
INTRAVENOUS | Status: DC | PRN
  Administered 2016-10-31: 100 mg via INTRAVENOUS

## 2016-10-31 MED ORDER — SODIUM CHLORIDE 0.9 % IV SOLN: INTRAVENOUS | Status: DC

## 2016-10-31 NOTE — Transfer of Care (Signed)
Immediate Anesthesia Transfer of Care Note  Patient: Theodore [name]  Procedure(s) Performed: Procedure(s): ESOPHAGOGASTRODUODENOSCOPY (EGD) WITH PROPOFOL (N/A)  Patient Location: PACU and Endoscopy Unit  Anesthesia Type:General  Level of Consciousness: sedated  Airway & Oxygen Therapy: Patient Spontanous Breathing and Patient connected to nasal cannula oxygen  Post-op Assessment: Report given to RN and Post -op Vital signs reviewed and stable  Post vital signs: Reviewed and stable  Last Vitals:  Vitals:   10/31/16 0959  BP: (!) 157/57  Pulse: (!) 51  Resp: 20  Temp: (!) 35.4 C  SpO2: 100%    Last Pain:  Vitals:   10/31/16 0959  TempSrc: Tympanic         Complications: No apparent anesthesia complications

## 2016-10-31 NOTE — Anesthesia Preprocedure Evaluation (Signed)
Anesthesia Evaluation  Patient identified by MRN, date of birth, ID band Patient awake    Reviewed: Allergy & Precautions, NPO status , Patient's Chart, lab work & pertinent test results  History of Anesthesia Complications Negative for: history of anesthetic complications  Airway Mallampati: II       Dental  (+) Partial Lower, Partial Upper   Pulmonary COPD (pt denies),           Cardiovascular + CAD and + Peripheral Vascular Disease       Neuro/Psych Depression    GI/Hepatic Neg liver ROS,   Endo/Other  Hypothyroidism   Renal/GU negative Renal ROS     Musculoskeletal   Abdominal   Peds  Hematology   Anesthesia Other Findings   Reproductive/Obstetrics                             Anesthesia Physical Anesthesia Plan  ASA: III  Anesthesia Plan: General   Post-op Pain Management:    Induction: Intravenous  PONV Risk Score and Plan: Propofol infusion  Airway Management Planned:   Additional Equipment:   Intra-op Plan:   Post-operative Plan:   Informed Consent: I have reviewed the patients History and Physical, chart, labs and discussed the procedure including the risks, benefits and alternatives for the proposed anesthesia with the patient or authorized representative who has indicated his/her understanding and acceptance.     Plan Discussed with:   Anesthesia Plan Comments:         Anesthesia Quick Evaluation

## 2016-10-31 NOTE — Op Note (Signed)
[facility] Gastroenterology Patient Name: Theodore [name] Procedure Date: 10/31/2016 11:15 AM MRN: [id number] Account #: [id number] Date of Birth: 1929/06/12 Admit Type: Outpatient Age: 83 Room: [facility] ENDO ROOM 3 Gender: Male Note Status: Finalized Procedure:            Upper GI endoscopy Indications:          Dysphagia Providers:            Lollie Sails, MD Referring MD:         [name], MD (Referring MD) Medicines:            Monitored Anesthesia Care Complications:        No immediate complications. Procedure:            Pre-Anesthesia Assessment:                       - ASA Grade Assessment: III - A patient with severe                        systemic disease.                       After obtaining informed consent, the endoscope was                        passed under direct vision. Throughout the procedure,                        the patient's blood pressure, pulse, and oxygen                        saturations were monitored continuously. The Endoscope                        was introduced through the mouth, and advanced to the                        third part of duodenum. The upper GI endoscopy was                        accomplished without difficulty. Findings:      prominant cricopharyngeus muscle      The Z-line was variable. Biopsies were taken with a cold forceps for       histology.      Abnormal motility was noted in the middle third of the esophagus and in       the lower third of the esophagus. There is a decrease in motility of the       esophageal body. The distal esophagus/lower esophageal sphincter is       open. Tertiary peristaltic waves are noted.      Diffuse minimal inflammation characterized by erythema was found in the       gastric body. Biopsies were taken with a cold forceps for histology.      A small hiatal hernia was present.      The examined duodenum was normal.      The cardia and gastric fundus were normal on  retroflexion otherwise.      There is no evidence of stenosis or stricture. Impression:           - Z-line variable. Biopsied.                       -  Abnormal esophageal motility, suspicious for                        presbyesophagus.                       - Gastritis. Biopsied.                       - Small hiatal hernia.                       - Normal examined duodenum. Recommendation:       - Discharge patient to home.                       - Use Aciphex (rabeprazole) 20 mg PO daily daily.                       - Return to GI clinic in 4 weeks. Procedure Code(s):    --- Professional ---                       [phone], Esophagogastroduodenoscopy, flexible, transoral;                        with biopsy, single or multiple Diagnosis Code(s):    --- Professional ---                       K22.8, Other specified diseases of esophagus                       K22.4, Dyskinesia of esophagus                       K29.70, Gastritis, unspecified, without bleeding                       K44.9, Diaphragmatic hernia without obstruction or                        gangrene                       R13.10, Dysphagia, unspecified CPT copyright 2016 American Medical Association. All rights reserved. The codes documented in this report are preliminary and upon coder review may  be revised to meet current compliance requirements. Lollie Sails, MD 10/31/2016 11:49:54 AM This report has been signed electronically. Number of Addenda: 0 Note Initiated On: 10/31/2016 11:15 AM      [facility]

## 2016-10-31 NOTE — H&P (Signed)
Outpatient short stay form Pre-procedure 10/31/2016 11:15 AM Theodore Sails MD  Primary Physician: Dr [name]  Reason for visit:  EGD  History of present illness:  Patient is a 83 year old male presenting today as above. He has a personal history of dysphagia. He has had fairly extensive workup in the past by speech pathology as well as radiology indicating a oral pharyngeal dysphagia with multiple irregularities in the posterior pharynx physiology retention of fluid in the vallecular and piriform sinuses delayed pharyngeal swallow initiation decreased anterior hyaloid movement decreased tongue base retraction and incomplete epiglottic inversion. A small liquid tracer or second swallow helps these. He also has some difficulty with thick nasopharyngeal secretions and mucus. He had a standard barium swallow also at about the same time in November 2017 showing a prominent cricopharyngeus muscle however a barium tablet passed promptly from the mouth to the stomach. There was a tiny diverticulum noted in the posterior wall of the distal cervical esophagus. He does not regurgitate foods but rather will cough things. Patient denies use of aspirin or blood thinning agents.    Current Facility-Administered Medications:  .  0.9 %  sodium chloride infusion, , Intravenous, Continuous, Theodore Sails, MD, Last Rate: 20 mL/hr at 10/31/16 1013 .  0.9 %  sodium chloride infusion, , Intravenous, Continuous, Theodore Sails, MD  Prescriptions Prior to Admission  Medication Sig Dispense Refill Last Dose  . Ascorbic Acid (VITAMIN C) 1000 MG tablet Take 1,000 mg by mouth daily.   10/29/2016  . Iodine, Kelp, 0.15 MG TABS Take 1 tablet by mouth daily.   Past Month at Unknown time  . levothyroxine (SYNTHROID, LEVOTHROID) 100 MCG tablet Take 1 tablet by mouth once daily before breakfast 90 tablet 0 10/30/2016 at Unknown time  . OMEGA-3 FATTY ACIDS PO Take 300 mg by mouth daily.   10/29/2016  . ALPRAZolam  (XANAX) 0.25 MG tablet Take 0.25 mg by mouth daily as needed. for anxiety  1 Taking  . Azelastine-Fluticasone (DYMISTA) 137-50 MCG/ACT SUSP Place 1 spray into the nose 2 (two) times daily. 23 g 0 08/31/2016  . cholecalciferol (VITAMIN D) 1000 UNITS tablet Take 4,000 Units by mouth daily.    10/29/2016  . co-enzyme Q-10 30 MG capsule Take 30 mg by mouth daily.    10/29/2016  . COD LIVER OIL PO Take by mouth.   10/29/2016  . cyanocobalamin 500 MCG tablet Take 500 mcg by mouth daily.   10/29/2016  . fluocinonide ointment (LIDEX) 7.56 % Apply 1 application topically 2 (two) times daily. To external ear canal for one week as needed 30 g 1 08/31/2016  . magnesium oxide (MAG-OX) 400 MG tablet Take 400 mg by mouth daily.   10/29/2016  . mometasone (ELOCON) 0.1 % cream Apply 1 application topically daily.   Not Taking at Unknown time  . OVER THE COUNTER MEDICATION Tumeric daily   10/29/2016  . OVER THE COUNTER MEDICATION Take 4 tablets by mouth 2 (two) times daily. Standard Process- Cardio Plus    Taking  . OVER THE COUNTER MEDICATION Take 4 tablets by mouth 2 (two) times daily. Standard Process- Cataplex    10/29/2016  . OVER THE COUNTER MEDICATION Standard Process- Folic Acid   4/33/2951  . OVER THE COUNTER MEDICATION Take by mouth daily. Hemp oil   10/29/2016  . Probiotic Product (PROBIOTIC ACIDOPHILUS) CAPS Take 1 capsule by mouth daily.   10/29/2016  . RESTASIS 0.05 % ophthalmic emulsion Place 1 drop into both eyes  2 (two) times daily.   10/31/2016  . testosterone cypionate (DEPOTESTOSTERONE CYPIONATE) 200 MG/ML injection INJECT 0.50 MLS INTO THE MUSCLE EVERY 14 DAYS 1 mL 5 10/29/2016  . UNABLE TO FIND Take 4 tablets by mouth 2 (two) times daily. Med Name: Standard process- Cyruta Plus   10/29/2016  . vitamin E (VITAMIN E) 400 UNIT capsule Take 400 Units by mouth daily.   10/29/2016     Allergies  Allergen Reactions  . Penicillins Rash     Past Medical History:  Diagnosis Date  . Arthritis   . Cancer  [facility]) 2002   melanoma right ear  . Colon polyp   . COPD (chronic obstructive pulmonary disease) ([place])    pt said he believes it is a misdiagnosis   . Depression   . History of kidney stones   . Hypothyroidism   . Rhinitis   . Thyroid disease    hypothyroid  . Tremors of nervous system    Benign    Review of systems:      Physical Exam    Heart and lungs: Regular rate and rhythm without rub or gallop, lungs are bilaterally clear.    HEENT: Normocephalic atraumatic eyes are anicteric    Other:     Pertinant exam for procedure: Soft nontender nondistended bowel sounds positive normoactive.    Planned proceedures: EGD and indicated procedures. I have discussed the risks benefits and complications of procedures to include not limited to bleeding, infection, perforation and the risk of sedation and the patient wishes to proceed.    Theodore Sails, MD Gastroenterology 10/31/2016  11:15 AM

## 2016-10-31 NOTE — Anesthesia Post-op Follow-up Note (Signed)
Anesthesia QCDR form completed.        

## 2016-10-31 NOTE — Anesthesia Postprocedure Evaluation (Signed)
Anesthesia Post Note  Patient: Theodore [name]  Procedure(s) Performed: Procedure(s) (LRB): ESOPHAGOGASTRODUODENOSCOPY (EGD) WITH PROPOFOL (N/A)  Patient location during evaluation: Endoscopy Anesthesia Type: General Level of consciousness: awake and alert Pain management: pain level controlled Vital Signs Assessment: post-procedure vital signs reviewed and stable Respiratory status: spontaneous breathing, nonlabored ventilation, respiratory function stable and patient connected to nasal cannula oxygen Cardiovascular status: blood pressure returned to baseline and stable Postop Assessment: no signs of nausea or vomiting Anesthetic complications: no     Last Vitals:  Vitals:   10/31/16 1144 10/31/16 1145  BP: 104/66 104/66  Pulse: (!) 57 (!) 56  Resp: 16 16  Temp: (!) 35.6 C (!) 35.6 C  SpO2: 99% 99%    Last Pain:  Vitals:   10/31/16 1145  TempSrc: Tympanic                 Martha Clan

## 2016-11-01 DIAGNOSIS — E049 Nontoxic goiter, unspecified: Secondary | ICD-10-CM | POA: Diagnosis not present

## 2016-11-01 DIAGNOSIS — M1712 Unilateral primary osteoarthritis, left knee: Secondary | ICD-10-CM | POA: Diagnosis not present

## 2016-11-01 DIAGNOSIS — F329 Major depressive disorder, single episode, unspecified: Secondary | ICD-10-CM | POA: Diagnosis not present

## 2016-11-01 DIAGNOSIS — Z880 Allergy status to penicillin: Secondary | ICD-10-CM | POA: Diagnosis not present

## 2016-11-01 DIAGNOSIS — R0689 Other abnormalities of breathing: Secondary | ICD-10-CM | POA: Diagnosis not present

## 2016-11-01 DIAGNOSIS — I10 Essential (primary) hypertension: Secondary | ICD-10-CM | POA: Diagnosis not present

## 2016-11-02 ENCOUNTER — Telehealth: Payer: Self-pay | Admitting: Internal Medicine

## 2016-11-02 ENCOUNTER — Telehealth: Payer: Self-pay | Admitting: Cardiovascular Disease

## 2016-11-02 ENCOUNTER — Encounter: Payer: Self-pay | Admitting: Gastroenterology

## 2016-11-02 LAB — SURGICAL PATHOLOGY

## 2016-11-02 NOTE — Telephone Encounter (Signed)
Patient brought in rhythm strips and EKG's along with letter stating that patient had some [place] and was instructed to bring them in for your review per Dr. Benay Pike. Left voicemail message for him to call back.

## 2016-11-02 NOTE — Telephone Encounter (Signed)
Placed in yellow result folder.

## 2016-11-02 NOTE — Telephone Encounter (Signed)
Pt dropped off his most recent EKG for Dr. Derrel Nip to look over. Please advise. Placed in mail to be delivered

## 2016-11-02 NOTE — Telephone Encounter (Signed)
Pt returning our call °Please call back ° °

## 2016-11-02 NOTE — Telephone Encounter (Signed)
Spoke with patient and let him know that it is not uncommon to have some arrhythmias during procedures that require sedation or medications. Patient states that he felt fine throughout and had no symptoms whatsoever. He just wanted Dr. Rockey Situ to be aware and have the images on file. Will show these to Dr. Rockey Situ and route message as well. Patient denies any symptoms and states that he is fine now.

## 2016-11-02 NOTE — Telephone Encounter (Signed)
Pt brought by EKGs from Dr. Benay Pike for Dr. Rockey Situ to review Placed in nurse box

## 2016-11-03 NOTE — Telephone Encounter (Signed)
Reviewed.  Dr Donivan Scull office has responded to patient and the rhythm strips have been uploaded to his chart

## 2016-11-23 DIAGNOSIS — M1712 Unilateral primary osteoarthritis, left knee: Secondary | ICD-10-CM | POA: Diagnosis not present

## 2016-11-23 DIAGNOSIS — M25562 Pain in left knee: Secondary | ICD-10-CM | POA: Diagnosis not present

## 2016-11-27 ENCOUNTER — Ambulatory Visit: Payer: PPO | Admitting: Internal Medicine

## 2016-12-11 NOTE — Telephone Encounter (Signed)
error 

## 2016-12-19 ENCOUNTER — Telehealth: Payer: Self-pay | Admitting: Internal Medicine

## 2016-12-19 DIAGNOSIS — J441 Chronic obstructive pulmonary disease with (acute) exacerbation: Secondary | ICD-10-CM | POA: Diagnosis not present

## 2016-12-19 NOTE — Telephone Encounter (Signed)
Advised patient to go to walk in clinic, patient agreed.

## 2016-12-19 NOTE — Telephone Encounter (Signed)
Pt called c/o lung congestion, sneezing, cough, and overall weakness. Please advise, thank you!  Call pt @ [phone]

## 2016-12-22 ENCOUNTER — Other Ambulatory Visit: Payer: Self-pay | Admitting: Internal Medicine

## 2017-01-01 DIAGNOSIS — M19219 Secondary osteoarthritis, unspecified shoulder: Secondary | ICD-10-CM | POA: Diagnosis not present

## 2017-01-01 DIAGNOSIS — M19019 Primary osteoarthritis, unspecified shoulder: Secondary | ICD-10-CM | POA: Diagnosis not present

## 2017-01-02 DIAGNOSIS — K2970 Gastritis, unspecified, without bleeding: Secondary | ICD-10-CM | POA: Diagnosis not present

## 2017-01-02 DIAGNOSIS — R066 Hiccough: Secondary | ICD-10-CM | POA: Diagnosis not present

## 2017-01-02 DIAGNOSIS — R1314 Dysphagia, pharyngoesophageal phase: Secondary | ICD-10-CM | POA: Diagnosis not present

## 2017-01-03 DIAGNOSIS — M25511 Pain in right shoulder: Secondary | ICD-10-CM | POA: Diagnosis not present

## 2017-01-03 DIAGNOSIS — M542 Cervicalgia: Secondary | ICD-10-CM | POA: Diagnosis not present

## 2017-01-06 DIAGNOSIS — K5903 Drug induced constipation: Secondary | ICD-10-CM | POA: Diagnosis not present

## 2017-01-09 DIAGNOSIS — M19019 Primary osteoarthritis, unspecified shoulder: Secondary | ICD-10-CM | POA: Diagnosis not present

## 2017-01-22 ENCOUNTER — Telehealth: Payer: Self-pay | Admitting: Internal Medicine

## 2017-01-22 NOTE — Telephone Encounter (Signed)
Copied from [place] #[id number]. Topic: Referral - Request >> Jan 22, 2017  9:05 AM Clack, Laban Emperor wrote: Reason for CRM: Pt states that he needs a referral to [facility].

## 2017-01-23 NOTE — Telephone Encounter (Signed)
Please use this encounter as an example of the inadequate information the first receiver is obtaining

## 2017-01-23 NOTE — Telephone Encounter (Signed)
Spoke with patient again. He was very upset and disrespectful because his referral was not placed within 24 hours after he requested it. Patient stated that this is the second time this has happened and last time he had to come up here and stand until he got what he wanted done. 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 Patient also stated that he was very unsatisfied with [place] and the way we run our practice. Advised patient that you were not in the office this morning but I would try my best to help him.

## 2017-01-23 NOTE — Telephone Encounter (Signed)
I do not place orders for referrals without a reason.  wahy does he want PT.  Why didn't anyone ask him that?

## 2017-01-23 NOTE — Telephone Encounter (Signed)
Pt called and stated that he is at the rehab facility at [facility] and they need a referral for PT.

## 2017-01-23 NOTE — Telephone Encounter (Signed)
Since I have never evaluated him for shoulder pain, I will not make a referral to PT .  Since he is seeing orthopedics for the problem tomorrow he should wait until he sees them and they will order PT if they feel it is appropriate

## 2017-01-23 NOTE — Telephone Encounter (Signed)
Spoke with pt and he stated that he needs the referral for right shoulder pain. He stated that he has gotten a referral from you for this before and PT discharged him do to pain got better. Pt stated that pain has now returned. He also stated that he has seen [facility] for this reason and they did an xray of his shoulder and he has an appt with the physical therapist there tomorrow at 2:40. However he stated that it would be more convenient for him to have the PT done at [facility]. Would you place the referral or would [facility] need to place the referral for him?

## 2017-01-24 DIAGNOSIS — M436 Torticollis: Secondary | ICD-10-CM | POA: Diagnosis not present

## 2017-01-24 DIAGNOSIS — M542 Cervicalgia: Secondary | ICD-10-CM | POA: Diagnosis not present

## 2017-01-24 NOTE — Telephone Encounter (Signed)
Spoke with pt and informed him of the message below and he stated that he understood and that everything is already taken care of.

## 2017-01-26 DIAGNOSIS — M542 Cervicalgia: Secondary | ICD-10-CM | POA: Diagnosis not present

## 2017-01-26 DIAGNOSIS — M436 Torticollis: Secondary | ICD-10-CM | POA: Diagnosis not present

## 2017-01-29 DIAGNOSIS — M542 Cervicalgia: Secondary | ICD-10-CM | POA: Diagnosis not present

## 2017-01-29 DIAGNOSIS — M436 Torticollis: Secondary | ICD-10-CM | POA: Diagnosis not present

## 2017-02-05 DIAGNOSIS — M542 Cervicalgia: Secondary | ICD-10-CM | POA: Diagnosis not present

## 2017-02-05 DIAGNOSIS — M436 Torticollis: Secondary | ICD-10-CM | POA: Diagnosis not present

## 2017-02-23 DIAGNOSIS — R1314 Dysphagia, pharyngoesophageal phase: Secondary | ICD-10-CM | POA: Diagnosis not present

## 2017-02-23 DIAGNOSIS — K2970 Gastritis, unspecified, without bleeding: Secondary | ICD-10-CM | POA: Diagnosis not present

## 2017-02-23 DIAGNOSIS — R066 Hiccough: Secondary | ICD-10-CM | POA: Diagnosis not present

## 2017-03-12 ENCOUNTER — Other Ambulatory Visit: Payer: Self-pay

## 2017-03-12 DIAGNOSIS — Z79899 Other long term (current) drug therapy: Secondary | ICD-10-CM

## 2017-03-12 DIAGNOSIS — E785 Hyperlipidemia, unspecified: Secondary | ICD-10-CM

## 2017-03-12 MED ORDER — TESTOSTERONE CYPIONATE 200 MG/ML IM SOLN: INTRAMUSCULAR | 5 refills | Status: DC

## 2017-03-12 NOTE — Telephone Encounter (Signed)
Refilled: 09/05/2016 Last OV: 09/04/2016 Next OV: not scheduled Last Lab: 09/18/2016

## 2017-03-12 NOTE — Telephone Encounter (Signed)
He is overdue for labs.  Medication refilled but please make appt for fasting labs including testosterone

## 2017-03-19 ENCOUNTER — Ambulatory Visit: Payer: PPO | Admitting: Cardiovascular Disease

## 2017-03-19 ENCOUNTER — Telehealth: Payer: Self-pay | Admitting: Cardiovascular Disease

## 2017-03-19 ENCOUNTER — Encounter: Payer: Self-pay | Admitting: Cardiovascular Disease

## 2017-03-19 VITALS — BP 148/82 | HR 58 | Ht 75.0 in | Wt 185.8 lb

## 2017-03-19 DIAGNOSIS — E785 Hyperlipidemia, unspecified: Secondary | ICD-10-CM

## 2017-03-19 DIAGNOSIS — I25119 Atherosclerotic heart disease of native coronary artery with unspecified angina pectoris: Secondary | ICD-10-CM

## 2017-03-19 DIAGNOSIS — R001 Bradycardia, unspecified: Secondary | ICD-10-CM

## 2017-03-19 DIAGNOSIS — F418 Other specified anxiety disorders: Secondary | ICD-10-CM

## 2017-03-19 DIAGNOSIS — I700 Atherosclerosis of aorta: Secondary | ICD-10-CM

## 2017-03-19 NOTE — Patient Instructions (Addendum)
We will send a referral for pulmonary rehab for COPD, SOB  Medication Instructions:   No medication changes made  Labwork:  No new labs needed  Testing/Procedures:  No further testing at this time   Follow-Up: It was a pleasure seeing you in the office today. Please call us if you have new issues that need to be addressed before your next appt.  [phone]  Your physician wants you to follow-up in: 12 months as needed You will receive a reminder letter in the mail two months in advance. If you don't receive a letter, please call our office to schedule the follow-up appointment.  If you need a refill on your cardiac medications before your next appointment, please call your pharmacy.

## 2017-03-19 NOTE — Telephone Encounter (Signed)
Patient came into office and dropped off letter with concerns. He was scheduled to come in today to see Dr. Rockey Situ to discuss his concerns. Called and spoke with patient and reviewed letter that he dropped off along with triage note. Reviewed normal heart rate ranges and pulse oximetry readings along with blood pressure monitor and his diagnosis of COPD. He reports having shortness of breath with some weakness in the evening. Reviewed that his shortness of breath is from the COPD and that he should discuss this with pulmonary physician. He continued to discuss he had some abnormal EKG readings from July 2018 knee procedure and he was concerned that this may be causing his weakness. Patient will be coming in later to see Dr. Rockey Situ and advised he should discuss concerns at that time. He verbalized understanding of our conversation, agreement with plan, and had no further questions at this time.

## 2017-03-19 NOTE — Telephone Encounter (Signed)
Pt came into office stating  He is having episodes of HR beat is irregular  He brought in a letter States he is having SOB and weakness of legs Pt c/o Shortness Of Breath: STAT if SOB developed within the last 24 hours or pt is noticeably SOB on the phone  1. Are you currently SOB (can you hear that pt is SOB on the phone)? no  2. How long have you been experiencing SOB? Couple weeks  3. Are you SOB when sitting or when up moving around?  Both siting and moving around   4. Are you currently experiencing any other symptoms? Weakness of legs  States he just does not want to go ED  States he can feel perfectly normal for a couple hours and then all the sudden it will come on He states is comes usually around 6-630 pm every night   We may call on home/cell phone   Placed in nurses bin.

## 2017-03-19 NOTE — Progress Notes (Signed)
Cardiology Office Note  Date:  03/19/2017   ID:  Theodore [name], DOB December 13, 1929, MRN [id number]  PCP:  [name], MD   Chief Complaint  Patient presents with  . other    [name] chest discomfort, Low Hr, feeling completely wiped out with light activity, Sob and leg weakness. Meds reviewed verbally with pt.    HPI:  84 yo gentleman who lives at [facility] [place] with notes indicating a history of  asymptomatic bradycardia,  prior history of depression,  who presented to the hospital with episodes of shortness of breath, lightheadedness, fatigue 05/10/2016 Copd  Who presents for follow-up of his Sinus bradycardia and shortness of breath  In follow-up today he reports that he has started having SOB episodes again Legs weak, unsteady Similar to previous presentation one year ago "walking like a drunk"  Normal stress test 05/16/2016 Event monitor  Seen by Dr. Mortimer Fries, Started inhalers, did not help  Tried colloidal silver with nebulizer SOB better Weaned off by dec, felt great SOB back in the past 2 weeks Now cant sing in choir, harmonica  nap in the afternoon Tired getting dinner together, weak Breathing heavy in office, with talking, Has to stop and take a breath  Worried about low heartbeat using pulse oximeter to measure Previous event monitor reviewed showing PVCs Previously stopped his exercise program, was nervous about his heart  depression started October 2017,  significant weight loss since that time, anorexia  CT scan done in the hospital showing no significant PE contribute to shortness of breath  shows three-vessel coronary calcifications at least moderate in severity, aortic arch calcification mild to moderate  EKG personally reviewed by myself on todays visit Shows normal sinus rhythm rate 58 bpm no significant ST or T wave changes  Results below reviewed with him in detail Echo 05/09/2016 - Left ventricle: The cavity size was normal. Wall thickness was  increased  increased in a pattern of mild to moderate LVH. There  was mild concentric hypertrophy. Systolic function was normal.   The estimated ejection fraction was in the range of 60% to 65%.  Wall motion was normal; there were no regional wall motion abnormalities. Left ventricular diastolic function parameterswere normal. - Pulmonary arteries: Systolic pressure was within the normalrange.   Stress myoview 05/16/2016  Low risk study without evidence of ischemia or scar.  The left ventricular ejection fraction is hyperdynamic (>65%).  Sensitivity and specificity of the study are degraded by extracardiac activity.   EKG on today's visit person the reviewed by myself showing sinus bradycardia rate 57 bpm no significant ST or T-wave changes   PMH:   has a past medical history of Arthritis, Cancer ([place]) (2002), Colon polyp, COPD (chronic obstructive pulmonary disease) ([place]), Depression, History of kidney stones, Hypothyroidism, Rhinitis, Thyroid disease, and Tremors of nervous system.  PSH:    Past Surgical History:  Procedure Laterality Date  . ADENOIDECTOMY    . ESOPHAGOGASTRODUODENOSCOPY (EGD) WITH PROPOFOL N/A 10/31/2016   Procedure: ESOPHAGOGASTRODUODENOSCOPY (EGD) WITH PROPOFOL;  Surgeon: Lollie Sails, MD;  Location: [facility] ENDOSCOPY;  Service: Endoscopy;  Laterality: N/A;  . JOINT REPLACEMENT Right 2003   knee  . knee meniscus repair Right   . MELANOMA EXCISION Right    ear. Followed by Dr. Nehemiah Massed  . PATELLECTOMY Bilateral   . TONSILLECTOMY      Current Outpatient Medications  Medication Sig Dispense Refill  . Ascorbic Acid (VITAMIN C) 1000 MG tablet Take 1,000 mg by mouth daily.    [name]  cholecalciferol (VITAMIN D) 1000 UNITS tablet Take 4,000 Units by mouth daily.     [name] co-enzyme Q-10 30 MG capsule Take 30 mg by mouth daily.     . COD LIVER OIL PO Take by mouth.    . cyanocobalamin 500 MCG tablet Take 500 mcg by mouth daily.    . fluocinonide ointment (LIDEX) 1.61 % Apply 1  application topically 2 (two) times daily. To external ear canal for one week as needed 30 g 1  . levothyroxine (SYNTHROID, LEVOTHROID) 100 MCG tablet Take 1 tablet by mouth once daily before breakfast 90 tablet 0  . magnesium oxide (MAG-OX) 400 MG tablet Take 400 mg by mouth daily.    . mometasone (ELOCON) 0.1 % cream Apply 1 application topically daily.    . OMEGA-3 FATTY ACIDS PO Take 300 mg by mouth daily.    [name] OVER THE COUNTER MEDICATION Tumeric daily    . OVER THE COUNTER MEDICATION Take 4 tablets by mouth 2 (two) times daily. Standard Process- Cardio Plus     . OVER THE COUNTER MEDICATION Take 4 tablets by mouth 2 (two) times daily. Standard Process- Cataplex     . OVER THE COUNTER MEDICATION Take by mouth daily. Hemp oil    . Probiotic Product (PROBIOTIC ACIDOPHILUS) CAPS Take 1 capsule by mouth daily.    . RESTASIS 0.05 % ophthalmic emulsion Place 1 drop into both eyes 2 (two) times daily.    [name] testosterone cypionate (DEPOTESTOSTERONE CYPIONATE) 200 MG/ML injection INJECT 0.50 MLS INTO THE MUSCLE EVERY 14 DAYS 1 mL 5  . UNABLE TO FIND Take 4 tablets by mouth 2 (two) times daily. Med Name: Standard process- Cyruta Plus    . vitamin E (VITAMIN E) 400 UNIT capsule Take 400 Units by mouth daily.     No current facility-administered medications for this visit.      Allergies:   Penicillins   Social History:  The patient  reports that  has never smoked. he has never used smokeless tobacco. He reports that he drinks alcohol. He reports that he does not use drugs.   Family History:   family history includes Cancer in his sister; Heart disease in his father and mother; Hypertension in his mother.    Review of Systems: Review of Systems  Constitutional: Negative.   Respiratory: Positive for shortness of breath.   Cardiovascular: Negative.   Gastrointestinal: Negative.   Musculoskeletal: Negative.   Neurological: Negative.   Psychiatric/Behavioral: Negative.   All other systems  reviewed and are negative.    PHYSICAL EXAM: Vitals:   03/19/17 1121  BP: (!) 148/82  Pulse: (!) 58   Body mass index is 23.22 kg/m.  Vitals:   03/19/17 1121  Weight: 185 lb 12 oz (84.3 kg)  Height: 6\' 3"  (1.905 m)   GEN: Well nourished, well developed, in no acute distress  HEENT: normal  Neck: no JVD, carotid bruits, or masses Cardiac: RRR; no murmurs, rubs, or gallops,no edema  Respiratory:  clear to auscultation bilaterally, normal work of breathing GI: soft, nontender, nondistended, + BS MS: no deformity or atrophy  Skin: warm and dry, no rash Neuro:  Strength and sensation are intact Psych: euthymic mood, full affect    Recent Labs: 09/18/2016: ALT 13; BUN 19; Creatinine, Ser 1.19; Hemoglobin 16.0; Platelets 223.0; Potassium 4.9; Sodium 141; TSH 1.13    Lipid Panel Lab Results  Component Value Date   CHOL 216 (H) 09/18/2016   HDL 50.70 09/18/2016   [place]  150 (H) 09/18/2016   TRIG 76.0 09/18/2016      Wt Readings from Last 3 Encounters:  03/19/17 185 lb 12 oz (84.3 kg)  10/31/16 180 lb (81.6 kg)  09/04/16 185 lb 9.6 oz (84.2 kg)       ASSESSMENT AND PLAN:  Bradycardia - Plan: EKG 12-Lead Asymptomatic bradycardia, No further workup needed, previous event monitor reviewed with him Discussed PVCs and periodic low heart rate  Depression with anxiety Will defer to primary care, very anxious concerning his medical issues  Aortic atherosclerosis ([place]) He manages his cholesterol with over-the-counter medications vitamins  COPD Managed by Dr. Mortimer Fries, on inhalers Trying to do management his way,  used silver colloid with nebulizer and feels he got better, Scheduled to see pulmonary tomorrow Legs are weak, short of breath still, recommended he see pulmonary rehab  Coronary artery disease involving native coronary artery of native heart with angina pectoris ([place]) Currently with no symptoms of angina. No further workup at this time. Continue current  medication regimen. Previous stress test reviewed with him   Fatigue, shortness of breath Negative stress test, normal echocardiogram Possibly secondary to depression, deconditioning, anxiety Referral to pulmonary rehab  Depression  depression since October 2017 Recommended he restart exercise program  CAD As above, three-vessel coronary disease on CT scan, Stress test With no ischemia  PAD Mild to moderate aortic atherosclerosis seen He does not want a statin    Disposition:   F/U  12 months As needed   Total encounter time more than 25 minutes  Greater than 50% was spent in counseling and coordination of care with the patient    Orders Placed This Encounter  Procedures  . EKG 12-Lead     Signed, Esmond Plants, M.D., Ph.D. 03/19/2017  [place], [place]

## 2017-03-20 ENCOUNTER — Ambulatory Visit: Payer: PPO | Admitting: Internal Medicine

## 2017-03-20 ENCOUNTER — Encounter: Payer: Self-pay | Admitting: Internal Medicine

## 2017-03-20 VITALS — BP 116/70 | HR 60 | Resp 16 | Ht 75.0 in | Wt 186.0 lb

## 2017-03-20 DIAGNOSIS — J449 Chronic obstructive pulmonary disease, unspecified: Secondary | ICD-10-CM | POA: Diagnosis not present

## 2017-03-20 NOTE — Addendum Note (Signed)
Addended by: Adair Laundry on: 03/20/2017 09:29 AM   Modules accepted: Orders

## 2017-03-20 NOTE — Telephone Encounter (Signed)
Scheduled pt for fasting lab work. Labs have been ordered. Pt is aware of appt date and time.

## 2017-03-20 NOTE — Patient Instructions (Signed)
Follow up as needed

## 2017-03-20 NOTE — Progress Notes (Signed)
   Name: EDDISON SEARLS MRN: [id number] DOB: [birth year]     CONSULTATION DATE:  03/20/2017   REFERRING MD : Derrel Nip  CHIEF COMPLAINT:  SOB   HISTORY OF PRESENT ILLNESS:  Alert and awake SOB when he found out his heart rate was low SOB resolved with time and relaxation No signs of infection  No signs of CHF  Patient states that anxiety is driving his SOB   REVIEW OF SYSTEMS:   ALL OTHER ROS ARE NEGATIVE   Resp 16   Ht 6\' 3"  (1.905 m)   Wt 186 lb (84.4 kg)   BMI 23.25 kg/m   Physical Examination:   GENERAL:NAD, no fevers, chills, no weakness no fatigue HEAD: Normocephalic, atraumatic.  EYES: Pupils equal, round, reactive to light. Extraocular muscles intact. No scleral icterus.  MOUTH: Moist mucosal membrane.   EAR, NOSE, THROAT: Clear without exudates. No external lesions.  NECK: Supple. No thyromegaly. No nodules. No JVD.  PULMONARY:CTA B/L no wheezes, no crackles, no rhonchi CARDIOVASCULAR: S1 and S2. Regular rate and rhythm. No murmurs, rubs, or gallops. No edema.  GASTROINTESTINAL: Soft, nontender, nondistended. No masses. Positive bowel sounds.  MUSCULOSKELETAL: No swelling, clubbing, or edema. Range of motion full in all extremities.  NEUROLOGIC: Cranial nerves II through XII are intact. No gross focal neurological deficits.  SKIN: No ulceration, lesions, rashes, or cyanosis. Skin warm and dry. Turgor intact.  PSYCHIATRIC: Mood, affect within normal limits. The patient is awake, alert and oriented x 3. Insight, judgment intact.    ASSESSMENT / PLAN: 84 yo white male with SOB and DOE with abnormal PFT's c/w Mild COPD Gold Stage A in setting of allergic rhinitis and probable GERD  1.SOB and DOE from COPD -albuterol as needed  2.COPD-Mild -albuterol as needed Avoid stress   3.allergic Rhinitis -continue  nasal sprays  4.probable GERD-start PPI  Follow up as needed   Patient satisfied with Plan of action and management. All questions answered  Corrin Parker, M.D.  [facility]  Medical Director [place] Director [facility] Cardio-Pulmonary Department

## 2017-03-22 ENCOUNTER — Other Ambulatory Visit: Payer: Self-pay | Admitting: Internal Medicine

## 2017-03-22 ENCOUNTER — Other Ambulatory Visit (INDEPENDENT_AMBULATORY_CARE_PROVIDER_SITE_OTHER): Payer: PPO

## 2017-03-22 DIAGNOSIS — E785 Hyperlipidemia, unspecified: Secondary | ICD-10-CM | POA: Diagnosis not present

## 2017-03-22 DIAGNOSIS — Z79899 Other long term (current) drug therapy: Secondary | ICD-10-CM

## 2017-03-22 LAB — COMPREHENSIVE METABOLIC PANEL
ALT: 12 U/L (ref 0–53)
AST: 17 U/L (ref 0–37)
Albumin: 4.4 g/dL (ref 3.5–5.2)
Alkaline Phosphatase: 40 U/L (ref 39–117)
BUN: 24 mg/dL — ABNORMAL HIGH (ref 6–23)
CO2: 31 mEq/L (ref 19–32)
Calcium: 9.3 mg/dL (ref 8.4–10.5)
Chloride: 102 mEq/L (ref 96–112)
Creatinine, Ser: 1.22 mg/dL (ref 0.40–1.50)
GFR: 59.89 mL/min — ABNORMAL LOW (ref >60.00)
Glucose, Bld: 100 mg/dL — ABNORMAL HIGH (ref 70–99)
Potassium: 4.7 mEq/L (ref 3.5–5.1)
Sodium: 139 mEq/L (ref 135–145)
Total Bilirubin: 1.5 mg/dL — ABNORMAL HIGH (ref 0.2–1.2)
Total Protein: 7 g/dL (ref 6.0–8.3)

## 2017-03-22 LAB — CBC WITH DIFFERENTIAL/PLATELET
Basophils Absolute: 0 10*3/uL (ref 0.0–0.1)
Basophils Relative: 0.6 % (ref 0.0–3.0)
Eosinophils Absolute: 0.3 10*3/uL (ref 0.0–0.7)
Eosinophils Relative: 6.9 % — ABNORMAL HIGH (ref 0.0–5.0)
HCT: 49.3 % (ref 39.0–52.0)
Hemoglobin: 16.6 g/dL (ref 13.0–17.0)
Lymphocytes Relative: 30.7 % (ref 12.0–46.0)
Lymphs Abs: 1.5 10*3/uL (ref 0.7–4.0)
MCHC: 33.7 g/dL (ref 30.0–36.0)
MCV: 96.5 fl (ref 78.0–100.0)
Monocytes Absolute: 0.4 10*3/uL (ref 0.1–1.0)
Monocytes Relative: 8.6 % (ref 3.0–12.0)
Neutro Abs: 2.7 10*3/uL (ref 1.4–7.7)
Neutrophils Relative %: 53.2 % (ref 43.0–77.0)
Platelets: 252 10*3/uL (ref 150.0–400.0)
RBC: 5.11 Mil/uL (ref 4.22–5.81)
RDW: 14.2 % (ref 11.5–15.5)
WBC: 5 10*3/uL (ref 4.0–10.5)

## 2017-03-22 LAB — LIPID PANEL
Cholesterol: 218 mg/dL — ABNORMAL HIGH (ref 0–200)
HDL: 55.1 mg/dL (ref >39.00)
LDL Cholesterol: 144 mg/dL — ABNORMAL HIGH (ref 0–99)
NonHDL: 162.44
Total CHOL/HDL Ratio: 4
Triglycerides: 92 mg/dL (ref 0.0–149.0)
VLDL: 18.4 mg/dL (ref 0.0–40.0)

## 2017-03-22 NOTE — Telephone Encounter (Signed)
Pt dropped off a letter about his testosterone injection. Letter is up front in Dr. Lupita Dawn color folder.

## 2017-03-22 NOTE — Telephone Encounter (Signed)
Please advise 

## 2017-03-23 NOTE — Telephone Encounter (Signed)
Refilled: 03/12/2017 Last OV: 09/04/2016 Next OV: 04/17/2017 Last Labs: 03/23/2017

## 2017-03-26 DIAGNOSIS — Z85820 Personal history of malignant melanoma of skin: Secondary | ICD-10-CM | POA: Diagnosis not present

## 2017-03-26 DIAGNOSIS — L820 Inflamed seborrheic keratosis: Secondary | ICD-10-CM | POA: Diagnosis not present

## 2017-03-26 DIAGNOSIS — D239 Other benign neoplasm of skin, unspecified: Secondary | ICD-10-CM

## 2017-03-26 DIAGNOSIS — D485 Neoplasm of uncertain behavior of skin: Secondary | ICD-10-CM | POA: Diagnosis not present

## 2017-03-26 DIAGNOSIS — D2262 Melanocytic nevi of left upper limb, including shoulder: Secondary | ICD-10-CM | POA: Diagnosis not present

## 2017-03-26 DIAGNOSIS — D1800 Hemangioma unspecified site: Secondary | ICD-10-CM | POA: Diagnosis not present

## 2017-03-26 DIAGNOSIS — L821 Other seborrheic keratosis: Secondary | ICD-10-CM | POA: Diagnosis not present

## 2017-03-26 DIAGNOSIS — Z1283 Encounter for screening for malignant neoplasm of skin: Secondary | ICD-10-CM | POA: Diagnosis not present

## 2017-03-26 HISTORY — DX: Other benign neoplasm of skin, unspecified: D23.9

## 2017-03-27 ENCOUNTER — Other Ambulatory Visit: Payer: Self-pay | Admitting: Internal Medicine

## 2017-03-27 LAB — TESTOS,TOTAL,FREE AND SHBG (FEMALE)
Free Testosterone: 37.4 pg/mL (ref 30.0–135.0)
Sex Hormone Binding: 53 nmol/L (ref 22–77)
Testosterone, Total, LC-MS-MS: 406 ng/dL (ref 250–1100)

## 2017-03-27 MED ORDER — TESTOSTERONE CYPIONATE 200 MG/ML IM SOLN: INTRAMUSCULAR | 0 refills | Status: DC

## 2017-04-02 DIAGNOSIS — H04123 Dry eye syndrome of bilateral lacrimal glands: Secondary | ICD-10-CM | POA: Diagnosis not present

## 2017-04-17 ENCOUNTER — Ambulatory Visit: Payer: PPO

## 2017-04-18 ENCOUNTER — Other Ambulatory Visit: Payer: Self-pay | Admitting: Internal Medicine

## 2017-04-26 ENCOUNTER — Other Ambulatory Visit: Payer: Self-pay | Admitting: Internal Medicine

## 2017-04-26 MED ORDER — ALPRAZOLAM 0.25 MG PO TABS: 0.2500 mg | ORAL_TABLET | Freq: Every day | ORAL | 1 refills | Status: DC | PRN

## 2017-04-26 NOTE — Telephone Encounter (Signed)
Med was not discontinued.  Epic dc's it automatically when it runs out of refills . Please call in ,  I will authorize for 30 days with one refill.  Needs OV to be scheduled

## 2017-04-26 NOTE — Telephone Encounter (Signed)
Copied from [place]. Topic: Quick Communication - Rx Refill/Question >> Apr 26, 2017 10:02 AM Theodore [name], NT wrote: Medication: Alprazolam .25 mg   Has the patient contacted their pharmacy? Yes    (Agent: If no, request that the patient contact the pharmacy for the refill.)  [id number] [name], [place] (Phone) [phone] (Fax)   Preferred Pharmacy (with phone number or street name):    Agent: Please be advised that RX refills may take up to 3 business days. We ask that you follow-up with your pharmacy.

## 2017-04-26 NOTE — Telephone Encounter (Signed)
Alprazolam  refill Last OV:  09/04/16 Last Refill: med was discontinued on 03/19/17

## 2017-04-26 NOTE — Telephone Encounter (Signed)
Line busy no voicemail script called to pharmacy patient needs 3 month Follow up scheduled PEC can advised and schedule.

## 2017-04-27 NOTE — Telephone Encounter (Signed)
Printed, signed and faxed.  

## 2017-04-27 NOTE — Telephone Encounter (Signed)
Duplicate request

## 2017-04-27 NOTE — Telephone Encounter (Signed)
Refilled: 04/26/2017 Last OV: 09/04/2016 Next OV: not scheduled

## 2017-05-01 DIAGNOSIS — H04123 Dry eye syndrome of bilateral lacrimal glands: Secondary | ICD-10-CM | POA: Diagnosis not present

## 2017-05-04 ENCOUNTER — Ambulatory Visit: Payer: PPO

## 2017-05-07 DIAGNOSIS — D485 Neoplasm of uncertain behavior of skin: Secondary | ICD-10-CM | POA: Diagnosis not present

## 2017-05-07 DIAGNOSIS — L988 Other specified disorders of the skin and subcutaneous tissue: Secondary | ICD-10-CM | POA: Diagnosis not present

## 2017-05-07 DIAGNOSIS — Z86018 Personal history of other benign neoplasm: Secondary | ICD-10-CM

## 2017-05-07 HISTORY — DX: Personal history of other benign neoplasm: Z86.018

## 2017-05-14 DIAGNOSIS — D485 Neoplasm of uncertain behavior of skin: Secondary | ICD-10-CM | POA: Diagnosis not present

## 2017-05-14 DIAGNOSIS — L820 Inflamed seborrheic keratosis: Secondary | ICD-10-CM | POA: Diagnosis not present

## 2017-05-29 ENCOUNTER — Ambulatory Visit (INDEPENDENT_AMBULATORY_CARE_PROVIDER_SITE_OTHER): Payer: PPO

## 2017-05-29 ENCOUNTER — Telehealth: Payer: Self-pay

## 2017-05-29 VITALS — BP 108/64 | HR 64 | Temp 98.0°F | Resp 16 | Ht 75.0 in | Wt 185.1 lb

## 2017-05-29 DIAGNOSIS — Z0000 Encounter for general adult medical examination without abnormal findings: Secondary | ICD-10-CM | POA: Diagnosis not present

## 2017-05-29 MED ORDER — FLUTICASONE PROPIONATE 50 MCG/ACT NA SUSP: 2.0000 | Freq: Every day | NASAL | 6 refills | Status: DC

## 2017-05-29 NOTE — Progress Notes (Signed)
Subjective:   Theodore [name] is a 84 y.o. male who presents for Medicare Annual/Subsequent preventive examination.  Review of Systems:  No ROS.  Medicare Wellness Visit. Additional risk factors are reflected in the social history.  Cardiac Risk Factors include: advanced age (>66men, >38 women);male gender     Objective:    Vitals: BP 108/64 (BP Location: Left Arm, Patient Position: Sitting, Cuff Size: Normal)   Pulse 64   Temp 98 F (36.7 C) (Oral)   Resp 16   Ht 6\' 3"  (1.905 m)   Wt 185 lb 1.9 oz (84 kg)   SpO2 99%   BMI 23.14 kg/m   Body mass index is 23.14 kg/m.  Advanced Directives 05/29/2017 10/31/2016 05/09/2016 05/09/2016 04/14/2016 03/30/2015  Does Patient Have a Medical Advance Directive? Yes Yes Yes Yes Yes Yes  Type of Paramedic of [place];Living will [place];Living will Healthcare Power of [place] Living will [place];Living will [place];Living will  Does patient want to make changes to medical advance directive? No - Patient declined - No - Patient declined - No - Patient declined No - Patient declined  Copy of [place] in Chart? No - copy requested No - copy requested No - copy requested - No - copy requested No - copy requested    Tobacco Social History   Tobacco Use  Smoking Status Never Smoker  Smokeless Tobacco Never Used     Counseling given: Not Answered   Clinical Intake:  Pre-visit preparation completed: Yes  Pain : No/denies pain     Nutritional Status: BMI of 19-24  Normal Diabetes: No  How often do you need to have someone help you when you read instructions, pamphlets, or other written materials from your doctor or pharmacy?: 1 - Never  Interpreter Needed?: No     Past Medical History:  Diagnosis Date  . Arthritis   . Cancer [facility]) 2002   melanoma right ear  . Colon polyp   . COPD (chronic obstructive pulmonary disease) ([place])     pt said he believes it is a misdiagnosis   . Depression   . History of kidney stones   . Hypothyroidism   . Rhinitis   . Thyroid disease    hypothyroid  . Tremors of nervous system    Benign   Past Surgical History:  Procedure Laterality Date  . ADENOIDECTOMY    . ESOPHAGOGASTRODUODENOSCOPY (EGD) WITH PROPOFOL N/A 10/31/2016   Procedure: ESOPHAGOGASTRODUODENOSCOPY (EGD) WITH PROPOFOL;  Surgeon: Lollie Sails, MD;  [facility] END[facility];  Service: Endoscopy;  Laterality: N/A;  . JOINT REPLACEMENT Right 2003   knee  . knee meniscus repair Right   . MELANOMA EXCISION Right    ear. Followed by Dr. Nehemiah Massed  . PATELLECTOMY Bilateral   . TONSILLECTOMY     Family History  Problem Relation Age of Onset  . Hypertension Mother   . Heart disease Mother        CHF  . Heart disease Father   . Cancer Sister        melanoma   Social History   Socioeconomic History  . Marital status: Married    Spouse name: [name]  . Number of children: 4  . Years of education: 49  . Highest education level: None  Social Needs  . Financial resource strain: Not hard at all  . Food insecurity - worry: Never true  . Food insecurity - inability:  Never true  . Transportation needs - medical: No  . Transportation needs - non-medical: No  Occupational History  . Occupation: Retired    Comment: Architect -   Tobacco Use  . Smoking status: Never Smoker  . Smokeless tobacco: Never Used  Substance and Sexual Activity  . Alcohol use: Yes    Comment: 1 glass of wine daily  . Drug use: No  . Sexual activity: Not Currently  Other Topics Concern  . None  Social History Narrative   Mr. Pelissier grew up in [place], [place]. 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 He and his wife are living at [facility]. He is very active at [facility] [facility]. He enjoys music.     Outpatient Encounter Medications as of 05/29/2017  Medication Sig  . ALPRAZolam (XANAX) 0.25 MG tablet Take 1 tablet (0.25 mg total) by mouth daily as needed for anxiety.  . Ascorbic Acid (VITAMIN C) 1000 MG tablet Take 1,000 mg by mouth daily.  . cholecalciferol (VITAMIN D) 1000 UNITS tablet Take 4,000 Units by mouth daily.   [name] co-enzyme Q-10 30 MG capsule Take 30 mg by mouth daily.   . COD LIVER OIL PO Take by mouth.  . cyanocobalamin 500 MCG tablet Take 500 mcg by mouth daily.  [name] levothyroxine (SYNTHROID, LEVOTHROID) 100 MCG tablet Take 1 tablet by mouth once daily before breakfast  . magnesium oxide (MAG-OX) 400 MG tablet Take 400 mg by mouth daily.  [name] MELATONIN PO Take 1 capsule by mouth.  . mometasone (ELOCON) 0.1 % cream Apply 1 application topically daily.  . OMEGA-3 FATTY ACIDS PO Take 300 mg by mouth daily.  [name] OVER THE COUNTER MEDICATION Tumeric daily  . OVER THE COUNTER MEDICATION Take 4 tablets by mouth 2 (two) times daily. Standard Process- Cardio Plus   . OVER THE COUNTER MEDICATION Take 4 tablets by mouth 2 (two) times daily. Standard Process- Cataplex   . OVER THE COUNTER MEDICATION Take by mouth daily. Hemp oil  . Probiotic Product (PROBIOTIC ACIDOPHILUS) CAPS Take 1 capsule by mouth daily.  . RESTASIS 0.05 % ophthalmic emulsion Place 1 drop into both eyes 2 (two) times daily.  [name] testosterone cypionate (DEPOTESTOSTERONE CYPIONATE) 200 MG/ML injection INJECT 0.50 MLS INTO THE MUSCLE EVERY 14 DAYS  . UNABLE TO FIND Take 4 tablets by mouth 2 (two) times daily. Med Name: Standard process- Cyruta Plus  . vitamin E (VITAMIN E) 400 UNIT capsule Take 400 Units by mouth daily.   No facility-administered encounter medications on file as of 05/29/2017.     Activities of Daily Living In your present state of health, do you have any difficulty performing the following activities: 05/29/2017  Hearing? Y    Comment Hearing aids  Vision? N  Difficulty concentrating or making decisions? Y  Walking or climbing stairs? N  Dressing or bathing? N  Doing errands, shopping? N  Preparing Food and eating ? N  Using the Toilet? N  In the past six months, have you accidently leaked urine? N  Do you have problems with loss of bowel control? N  Managing your Medications? N  Managing your Finances? N  Housekeeping or managing your Housekeeping? N  Some recent data might be hidden    Patient Care Team: [name], MD as PCP -  General (Internal Medicine)   Assessment:   This is a routine wellness examination for [place]. The goal of the wellness visit is to assist the patient how to close the gaps in care and create a preventative care plan for the patient.   The roster of all physicians providing medical care to patient is listed in the Snapshot section of the chart.  Taking calcium VIT D as appropriate/Osteoporosis risk reviewed.    Safety issues reviewed; Smoke and carbon monoxide detectors in the home. No firearms or firearms locked in a safe within the home. Wears seatbelts when driving or riding with others. No violence in the home.  They do not have excessive sun exposure.  Discussed the need for sun protection: hats, long sleeves and the use of sunscreen if there is significant sun exposure.  Patient is alert, normal appearance, oriented to person/place/and time. Correctly identified the president of the [place] and recalls of 3/3 words.  Performs simple calculations and can read correct time from watch face. Displays appropriate judgement.  No new identified risk were noted.  No failures at ADL's or IADL's.    BMI- discussed the importance of a healthy diet, water intake and the benefits of aerobic exercise. Educational material provided.   24 hour diet recall: Low cholesterol/low carb diet  Dental- every 6 months.  Dr.Booth ([place]).  Eye- Visual acuity not assessed per patient  preference since they have regular follow up with the ophthalmologist.  Wears corrective lenses.  Sleep patterns- Sleeps 6-8 hours at night.  Wakes feeling rested.  Naps during the day.    Health maintenance gaps- closed.  Patient Concerns: None at this time. Follow up with PCP as needed.  Exercise Activities and Dietary recommendations Current Exercise Habits: Home exercise routine, Type of exercise: calisthenics;yoga, Time (Minutes): 60, Frequency (Times/Week): 3, Weekly Exercise (Minutes/Week): 180, Intensity: Moderate  Goals    .  INCREASE WATER INTAKE       Fall Risk Fall Risk  05/29/2017 04/14/2016 03/30/2015  Falls in the past year? No Yes No  Number falls in past yr: - 1 -  Injury with Fall? - No -  Follow up - Falls prevention discussed -   Depression Screen PHQ 2/9 Scores 05/29/2017 04/14/2016 03/30/2015  PHQ - 2 Score 0 0 0    Cognitive Function MMSE - Mini Mental State Exam 03/30/2015  Orientation to time 5  Orientation to Place 5  Registration 3  Attention/ Calculation 5  Recall 3  Language- name 2 objects 2  Language- repeat 1  Language- follow 3 step command 3  Language- read & follow direction 1  Write a sentence 0  Write a sentence-comments Some difficulty. Tremors.  Copy design 1  Total score 29     6CIT Screen 05/29/2017 04/14/2016  What Year? 0 points 0 points  What month? 0 points 0 points  What time? 0 points 0 points  Count back from 20 0 points 0 points  Months in reverse 0 points 0 points  Repeat phrase 0 points 0 points  Total Score 0 0    Immunization History  Administered Date(s) Administered  . Influenza,inj,Quad PF,6+ Mos 01/14/2014  . Influenza-Unspecified 12/11/2012  . Pneumococcal Conjugate-13 01/14/2014  . Pneumococcal Polysaccharide-23 04/28/2012  . Tdap 08/19/2014  . Zoster 04/28/2010   Screening Tests Health Maintenance  Topic Date Due  . INFLUENZA VACCINE  01/10/2018 (Originally 10/11/2016)  . TETANUS/TDAP  08/18/2024  .  PNA vac Low Risk Adult  Completed  Plan:    End of life planning; Advance aging; Advanced directives discussed. Copy of current HCPOA/Living Will requested.    I have personally reviewed and noted the following in the patient's chart:   . Medical and social history . Use of alcohol, tobacco or illicit drugs  . Current medications and supplements . Functional ability and status . Nutritional status . Physical activity . Advanced directives . List of other physicians . Hospitalizations, surgeries, and ER visits in previous 12 months . Vitals . Screenings to include cognitive, depression, and falls . Referrals and appointments  In addition, I have reviewed and discussed with patient certain preventive protocols, quality metrics, and best practice recommendations. A written personalized care plan for preventive services as well as general preventive health recommendations were provided to patient.     Varney Biles, LPN  05/22/8116   Reviewed above information.  Agree with assessment and plan.   Dr Nicki Reaper

## 2017-05-29 NOTE — Telephone Encounter (Signed)
flonase sent

## 2017-05-29 NOTE — Telephone Encounter (Signed)
Requests flonase be sent to local pharmacy for continued post nasal drip.  Will schedule follow up if symptoms do not subside.

## 2017-05-29 NOTE — Patient Instructions (Addendum)
  [name] , Thank you for taking time to come for your Medicare Wellness Visit. I appreciate your ongoing commitment to your health goals. Please review the following plan we discussed and let me know if I can assist you in the future.   Follow up with Dr. Derrel Nip as needed.    Bring a copy of your [place] and/or Living Will to be scanned into chart.  Have a great day!  These are the goals we discussed: Goals    .  INCREASE WATER INTAKE       This is a list of the screening recommended for you and due dates:  Health Maintenance  Topic Date Due  . Flu Shot  01/10/2018*  . Tetanus Vaccine  08/18/2024  . Pneumonia vaccines  Completed  *Topic was postponed. The date shown is not the original due date.

## 2017-06-06 DIAGNOSIS — H04123 Dry eye syndrome of bilateral lacrimal glands: Secondary | ICD-10-CM | POA: Diagnosis not present

## 2017-06-11 ENCOUNTER — Telehealth: Payer: Self-pay | Admitting: Internal Medicine

## 2017-06-11 NOTE — Telephone Encounter (Signed)
Copied from [place]. Topic: Appointment Scheduling - Scheduling Inquiry for Clinic >> Jun 11, 2017  8:59 AM Synthia Innocent wrote: Reason for CRM: requesting follow up on meds, requesting to be seen before next available in May, ok to work in?  Patient has been scheduled with Dr. Derrel Nip. He is aware of his appointment on 4.5.19 @ 4:00

## 2017-06-15 ENCOUNTER — Ambulatory Visit (INDEPENDENT_AMBULATORY_CARE_PROVIDER_SITE_OTHER): Payer: PPO | Admitting: Internal Medicine

## 2017-06-15 ENCOUNTER — Encounter: Payer: Self-pay | Admitting: Internal Medicine

## 2017-06-15 DIAGNOSIS — Z79899 Other long term (current) drug therapy: Secondary | ICD-10-CM

## 2017-06-15 DIAGNOSIS — F418 Other specified anxiety disorders: Secondary | ICD-10-CM

## 2017-06-15 DIAGNOSIS — J4530 Mild persistent asthma, uncomplicated: Secondary | ICD-10-CM | POA: Diagnosis not present

## 2017-06-15 MED ORDER — "SYRINGE 25G X 1"" 3 ML MISC": 0 refills | Status: DC

## 2017-06-15 MED ORDER — CYANOCOBALAMIN 1000 MCG/ML IJ SOLN: 1000.0000 ug | Freq: Once | INTRAMUSCULAR | 0 refills | Status: AC

## 2017-06-15 MED ORDER — ALBUTEROL SULFATE 108 (90 BASE) MCG/ACT IN AEPB: 2.0000 | INHALATION_SPRAY | RESPIRATORY_TRACT | 11 refills | Status: DC | PRN

## 2017-06-15 NOTE — Progress Notes (Signed)
Subjective:  Patient ID: Theodore [name], male    DOB: 10-04-1929  Age: 84 y.o. MRN: [id number]  CC: Diagnoses of Depression with anxiety, Mild persistent asthma without complication, and Long-term use of high-risk medication were pertinent to this visit.  HPI Theodore [name] presents for follow up on  Persistent shortness of breath,  hypothyroidism,  Low testosterone managed with injection biweekly  Last level was normal in January   Multiple complaints  1) shortness of breath,  Weakness of legs,  And no stamina.  Occurs often after dinner ,  Makes him short of breath  no loss of muscle tone.    Short of breath after one flight of stairs. Getting winded evan while doing yoga going to the grocery store.  Clears throat,  But does not cough often, except if he eats too quickly  2) had RFA of  Left knee in August and irregular heart rate noted . Bought a pulse oximetery and  noted drops in HR to 30's  For 2 minutes  At a time.  [name]    Has history of asthma but  Felt no imporvement after 2 weeks of spiriva and symbicort   . Had a cardiac workup in 2018 that was negative for iscehmia  Saw Cardiology and Pulmonology in January,  Reassured he was fine.  Felt great for a few onths.   His x wife of 14 years died recently and he  Has been grieved by it.   Does not believe in the after life . Suddenly realizes he is at the end of his life .    History of depression as a teenager,  Has not been treated in at least 10 years ago in [place] does not want to try medications  Takes Hemp Oil fror the last year ingd turmeric    Discussed referral to [facility]   Outpatient Medications Prior to Visit  Medication Sig Dispense Refill  . ALPRAZolam (XANAX) 0.25 MG tablet Take 1 tablet (0.25 mg total) by mouth daily as needed for anxiety. 30 tablet 1  . Ascorbic Acid (VITAMIN C) 1000 MG tablet Take 1,000 mg by mouth daily.    . cholecalciferol (VITAMIN D) 1000 UNITS tablet Take 4,000 Units by mouth daily.     [name]  co-enzyme Q-10 30 MG capsule Take 30 mg by mouth daily.     . COD LIVER OIL PO Take by mouth.    . cyanocobalamin 500 MCG tablet Take 500 mcg by mouth daily.    . fluticasone (FLONASE) 50 MCG/ACT nasal spray Place 2 sprays into both nostrils daily. 16 g 6  . levothyroxine (SYNTHROID, LEVOTHROID) 100 MCG tablet Take 1 tablet by mouth once daily before breakfast 90 tablet 0  . magnesium oxide (MAG-OX) 400 MG tablet Take 400 mg by mouth daily.    [name] MELATONIN PO Take 1 capsule by mouth.    . mometasone (ELOCON) 0.1 % cream Apply 1 application topically daily.    . OMEGA-3 FATTY ACIDS PO Take 300 mg by mouth daily.    [name] OVER THE COUNTER MEDICATION Tumeric daily    . OVER THE COUNTER MEDICATION Take 4 tablets by mouth 2 (two) times daily. Standard Process- Cardio Plus     . OVER THE COUNTER MEDICATION Take 4 tablets by mouth 2 (two) times daily. Standard Process- Cataplex     . OVER THE COUNTER MEDICATION Take by mouth daily. Hemp oil    . Probiotic Product (PROBIOTIC ACIDOPHILUS) CAPS Take  1 capsule by mouth daily.    . RESTASIS 0.05 % ophthalmic emulsion Place 1 drop into both eyes 2 (two) times daily.    [name] testosterone cypionate (DEPOTESTOSTERONE CYPIONATE) 200 MG/ML injection INJECT 0.50 MLS INTO THE MUSCLE EVERY 14 DAYS 10 mL 0  . UNABLE TO FIND Take 4 tablets by mouth 2 (two) times daily. Med Name: Standard process- Cyruta Plus    . vitamin E (VITAMIN E) 400 UNIT capsule Take 400 Units by mouth daily.     No facility-administered medications prior to visit.     Review of Systems;  Patient denies headache, fevers, malaise, unintentional weight loss, skin rash, eye pain, sinus congestion and sinus pain, sore throat, dysphagia,  hemoptysis , cough, dyspnea, wheezing, chest pain, palpitations, orthopnea, edema, abdominal pain, nausea, melena, diarrhea, constipation, flank pain, dysuria, hematuria, urinary  Frequency, nocturia, numbness, tingling, seizures,  Focal weakness, Loss of consciousness,   Tremor, insomnia, depression, anxiety, and suicidal ideation.      Objective:  BP 122/74 (BP Location: Left Arm, Patient Position: Sitting, Cuff Size: Normal)   Pulse (!) 57   Temp 97.6 F (36.4 C) (Oral)   Resp 15   Ht 6\' 3"  (1.905 m)   Wt 185 lb 6.4 oz (84.1 kg)   SpO2 96%   BMI 23.17 kg/m   BP Readings from Last 3 Encounters:  06/15/17 122/74  05/29/17 108/64  03/20/17 116/70    Wt Readings from Last 3 Encounters:  06/15/17 185 lb 6.4 oz (84.1 kg)  05/29/17 185 lb 1.9 oz (84 kg)  03/20/17 186 lb (84.4 kg)    General appearance: alert, cooperative and appears stated age Ears: normal TM's and external ear canals both ears Throat: lips, mucosa, and tongue normal; teeth and gums normal Neck: no adenopathy, no carotid bruit, supple, symmetrical, trachea midline and thyroid not enlarged, symmetric, no tenderness/mass/nodules Back: symmetric, no curvature. ROM normal. No CVA tenderness. Lungs: clear to auscultation bilaterally Heart: regular rate and rhythm, S1, S2 normal, no murmur, click, rub or gallop Abdomen: soft, non-tender; bowel sounds normal; no masses,  no organomegaly Pulses: 2+ and symmetric Skin: Skin color, texture, turgor normal. No rashes or lesions Lymph nodes: Cervical, supraclavicular, and axillary nodes normal.  No results found for: HGBA1C  Lab Results  Component Value Date   CREATININE 1.22 03/22/2017   CREATININE 1.19 09/18/2016   CREATININE 1.10 05/29/2016    Lab Results  Component Value Date   WBC 5.0 03/22/2017   HGB 16.6 03/22/2017   HCT 49.3 03/22/2017   PLT 252.0 03/22/2017   GLUCOSE 100 (H) 03/22/2017   CHOL 218 (H) 03/22/2017   TRIG 92.0 03/22/2017   HDL 55.10 03/22/2017   LDLCALC 144 (H) 03/22/2017   ALT 12 03/22/2017   AST 17 03/22/2017   NA 139 03/22/2017   K 4.7 03/22/2017   CL 102 03/22/2017   CREATININE 1.22 03/22/2017   BUN 24 (H) 03/22/2017   CO2 31 03/22/2017   TSH 1.13 09/18/2016   PSA 3.65 07/01/2014    No  results found.  Assessment & Plan:   Problem List Items Addressed This Visit    Long-term use of high-risk medication    He continues to request ongoing testosterone replacement for perceived  QOL  Benefits and is aware of the potential effects on cholesterol,  Hgb,  And PSA.  Lab Results  Component Value Date   ALT 12 03/22/2017   AST 17 03/22/2017   ALKPHOS 40 03/22/2017   BILITOT  1.5 (H) 03/22/2017   Lab Results  Component Value Date   WBC 5.0 03/22/2017   HGB 16.6 03/22/2017   HCT 49.3 03/22/2017   MCV 96.5 03/22/2017   PLT 252.0 03/22/2017   Lab Results  Component Value Date   CHOL 218 (H) 03/22/2017   HDL 55.10 03/22/2017   LDLCALC 144 (H) 03/22/2017   TRIG 92.0 03/22/2017   CHOLHDL 4 03/22/2017            Depression with anxiety    Secondary to patient's coming to terms with his own aging and impending mortality . He is not comforted by his [place] faith because he states that he really doesn't believe in the after life.  He does not want to try SSRI therapy.. Discussed role of psychotherapy and he is requesting referral to [facility].       Asthma    Recommended trial of albuterol MDI prior to exercise/exertion given his increased symptoms      Relevant Medications   Albuterol Sulfate (PROAIR RESPICLICK) 408 (90 Base) MCG/ACT AEPB    A total of 40 minutes was spent with patient more than half of which was spent in counseling patient on the above mentioned issues , reviewing and explaining recent labs , and coordination of care.   I am having Theodore [name] start on cyanocobalamin, SYRINGE 3CC/25GX1", and Albuterol Sulfate. I am also having him maintain his COD LIVER OIL PO, cholecalciferol, vitamin E, cyanocobalamin, magnesium oxide, co-enzyme Q-10, OVER THE COUNTER MEDICATION, OVER THE COUNTER MEDICATION, OVER THE COUNTER MEDICATION, PROBIOTIC ACIDOPHILUS BIOBEADS, OVER THE COUNTER MEDICATION, RESTASIS, UNABLE TO FIND, vitamin C, mometasone, OMEGA-3  FATTY ACIDS PO, testosterone cypionate, levothyroxine, ALPRAZolam, MELATONIN PO, and fluticasone.  Meds ordered this encounter  Medications  . cyanocobalamin (,VITAMIN B-12,) 1000 MCG/ML injection    Sig: Inject 1 mL (1,000 mcg total) into the muscle once for 1 dose.    Dispense:  10 mL    Refill:  0  . Syringe/Needle, Disp, (SYRINGE 3CC/25GX1") 25G X 1" 3 ML MISC    Sig: Use for b12 injections    Dispense:  50 each    Refill:  0  . Albuterol Sulfate (PROAIR RESPICLICK) 144 (90 Base) MCG/ACT AEPB    Sig: Inhale 2 puffs into the lungs as needed.    Dispense:  1 each    Refill:  11    There are no discontinued medications.  Follow-up: No follow-ups on file.   [name], MD

## 2017-06-15 NOTE — Patient Instructions (Signed)
Try using your albuterol inhaler  Prior to exercising.  It may help your exertional dyspnea   I have sent B12 injectible liquid and syringes to your pharmacy..  Try 1 ml injected weekly    Referral to Debbe Bales for management of grief [place]

## 2017-06-17 NOTE — Assessment & Plan Note (Signed)
He continues to request ongoing testosterone replacement for perceived  QOL  Benefits and is aware of the potential effects on cholesterol,  Hgb,  And PSA.  Lab Results  Component Value Date   ALT 12 03/22/2017   AST 17 03/22/2017   ALKPHOS 40 03/22/2017   BILITOT 1.5 (H) 03/22/2017   Lab Results  Component Value Date   WBC 5.0 03/22/2017   HGB 16.6 03/22/2017   HCT 49.3 03/22/2017   MCV 96.5 03/22/2017   PLT 252.0 03/22/2017   Lab Results  Component Value Date   CHOL 218 (H) 03/22/2017   HDL 55.10 03/22/2017   LDLCALC 144 (H) 03/22/2017   TRIG 92.0 03/22/2017   CHOLHDL 4 03/22/2017

## 2017-06-17 NOTE — Assessment & Plan Note (Signed)
Recommended trial of albuterol MDI prior to exercise/exertion given his increased symptoms 

## 2017-06-17 NOTE — Assessment & Plan Note (Addendum)
Secondary to patient's coming to terms with his own aging and impending mortality . He is not comforted by his [place] faith because he states that he really doesn't believe in the after life.  He does not want to try SSRI therapy.. Discussed role of psychotherapy and he is requesting referral to [facility].

## 2017-06-22 ENCOUNTER — Ambulatory Visit: Payer: PPO | Admitting: Psychology

## 2017-06-22 DIAGNOSIS — F339 Major depressive disorder, recurrent, unspecified: Secondary | ICD-10-CM | POA: Diagnosis not present

## 2017-06-26 ENCOUNTER — Telehealth: Payer: Self-pay | Admitting: Internal Medicine

## 2017-06-26 MED ORDER — "SYRINGE/NEEDLE (DISP) 25G X 1"" 3 ML MISC": 1.0000 "application " | 0 refills | Status: DC

## 2017-06-26 MED ORDER — "NEEDLE (DISP) 25G X 1"" MISC": 1.0000 "application " | 0 refills | Status: DC

## 2017-06-26 MED ORDER — CYANOCOBALAMIN 1000 MCG/ML IJ SOLN: 1000.0000 ug | INTRAMUSCULAR | 0 refills | Status: DC

## 2017-06-26 NOTE — Telephone Encounter (Signed)
Please advise 

## 2017-06-26 NOTE — Telephone Encounter (Signed)
Copied from [place] [id number]. Topic: Quick Communication - See Telephone Encounter >> Jun 26, 2017  1:35 PM Ether Griffins B wrote: CRM for notification. See Telephone encounter for: 06/26/17.  Pt calling in stating that [id number] [name], [place] has not received his B12 injectables. He states [facility] 2 days ago requesting it as well.

## 2017-06-26 NOTE — Telephone Encounter (Signed)
Spoke with pt to let him know that we have sent in the b12 injections.

## 2017-06-29 ENCOUNTER — Encounter: Payer: Self-pay | Admitting: Pulmonary Disease

## 2017-06-29 ENCOUNTER — Ambulatory Visit (INDEPENDENT_AMBULATORY_CARE_PROVIDER_SITE_OTHER): Payer: PPO | Admitting: Pulmonary Disease

## 2017-06-29 VITALS — BP 118/60 | HR 39 | Ht 75.0 in | Wt 184.0 lb

## 2017-06-29 DIAGNOSIS — R001 Bradycardia, unspecified: Secondary | ICD-10-CM

## 2017-06-29 DIAGNOSIS — R0609 Other forms of dyspnea: Secondary | ICD-10-CM

## 2017-06-29 DIAGNOSIS — I951 Orthostatic hypotension: Secondary | ICD-10-CM

## 2017-06-29 DIAGNOSIS — I493 Ventricular premature depolarization: Secondary | ICD-10-CM

## 2017-06-29 DIAGNOSIS — R531 Weakness: Secondary | ICD-10-CM

## 2017-06-29 DIAGNOSIS — R0600 Dyspnea, unspecified: Secondary | ICD-10-CM

## 2017-06-29 NOTE — Patient Instructions (Signed)
We will order a cardiopulmonary stress test which will evaluate cardiac function (including heart rhythm) and respiratory/pulmonary function during exercise.  This test will be performed in [place].  Follow-up with Dr. Mortimer Fries after this test has been performed.  A chest x-ray has been ordered to be performed prior to that follow-up visit.

## 2017-06-29 NOTE — Progress Notes (Signed)
ACUTE PULMONARY OFFICE VISIT  ACUTE PROBLEM: SOB, DOE, weakness, lightheadedness  CHRONIC PULMONARY PROBLEMS: Minimal obstructive lung disease (FEV1/FVC 66%, FEV!1 2.98, 148% pred) Chronic bradycardia Chronic frequent PVCs  SUBJ: 38 M never smoker, pt of DK, who has been evaluated in the past for symptoms very similar to his current problems including exertional dyspnea, lightheadedness and lower extremity weakness.  In the past, he has been diagnosed with mild obstructive lung disease (see PFT results documented below) and very frequent PVCs which were not felt to be the cause of his symptoms.  He has undergone a thorough cardiac evaluation approximately 1 year ago.  He is seen as add on today for increased dyspnea, lightheadedness/orthostasis, lower extremity weakness which accompanies dyspnea and lightheadedness.  He denies significant chest pain, cough, sputum production, hemoptysis, lower extremity edema, calf tenderness, orthopnea, paroxysmal nocturnal dyspnea.  OBJ: Vitals:   06/29/17 0855 06/29/17 0904  BP:  118/60  Pulse:  (!) 39  SpO2:  99%  Weight: 184 lb (83.5 kg)   Height: 6\' 3"  (1.905 m)     Gen: NAD HEENT: NCAT, sclera white Neck: No JVD Lungs: breath sounds full, no wheezes or other adventitious sounds Cardiovascular: Frequent extrasystoles, no murmurs Abdomen: Soft, nontender, normal BS Ext: without clubbing, cyanosis, edema Neuro: grossly intact Skin: Limited exam, no lesions noted  CXR 05/09/16: normal Echcoardiogram 05/09/16: normal CT chest 05/10/16: minimal bibasilar dependent atelectasis. No PE EKG 06/29/17: very freq PVC (bigeminy and trigeminy)  I have personally reviewed all chest radiographs reported above including CXRs and CT chest unless otherwise indicated  IMPRESSION: DOE (dyspnea on exertion)  Orthostasis  Weakness  Frequent PVCs  Sinus bradycardia  His constellation of symptoms are exertional dyspnea, weakness and orthostatic  symptoms.  All of these might be explained by cardiac problems (specifically, chronotropic insufficiency).  However, he also admits to significant stressors in his life recently and admits that there might be an anxiety component.  Based on his previous PFTs, CT chest, current exam and lack of response to inhaled bronchodilators, I feel certain that they cause of his exertional dyspnea does not have a pulmonary origin.  This could be chronotropic insufficiency or there might be a component of an panic/hyperventilation syndrome.   PLAN:   I have reviewed the above EKG with Dr. Rockey Situ.  We agreed to proceed with a cardiopulmonary stress test to be performed in [place] to better define the cause of his exertional limitation.  He is to follow-up with Dr. Mortimer Fries in 4 weeks after the above test has been performed with a CXR performed at the time of that follow-up.  I have reviewed this patient's medical problems, current medications and therapies and prior pulmonary office notes in evaluation and formulation of the above assessment and plan    Merton Border, MD PCCM service Mobile [phone] Pager [phone] 06/29/2017 9:14 AM

## 2017-07-02 ENCOUNTER — Ambulatory Visit: Payer: PPO | Admitting: Psychology

## 2017-07-02 DIAGNOSIS — F339 Major depressive disorder, recurrent, unspecified: Secondary | ICD-10-CM | POA: Diagnosis not present

## 2017-07-06 ENCOUNTER — Encounter: Payer: Self-pay | Admitting: Pulmonary Disease

## 2017-07-06 ENCOUNTER — Ambulatory Visit (HOSPITAL_COMMUNITY): Payer: PPO | Attending: Pulmonary Disease

## 2017-07-06 ENCOUNTER — Other Ambulatory Visit (HOSPITAL_COMMUNITY): Payer: Self-pay | Admitting: *Deleted

## 2017-07-06 DIAGNOSIS — R0600 Dyspnea, unspecified: Secondary | ICD-10-CM

## 2017-07-06 DIAGNOSIS — R0609 Other forms of dyspnea: Secondary | ICD-10-CM

## 2017-07-06 DIAGNOSIS — I493 Ventricular premature depolarization: Secondary | ICD-10-CM

## 2017-07-06 DIAGNOSIS — R001 Bradycardia, unspecified: Secondary | ICD-10-CM | POA: Diagnosis not present

## 2017-07-11 ENCOUNTER — Other Ambulatory Visit: Payer: Self-pay | Admitting: Internal Medicine

## 2017-07-11 DIAGNOSIS — E039 Hypothyroidism, unspecified: Secondary | ICD-10-CM

## 2017-07-11 NOTE — Telephone Encounter (Signed)
NEEDS TSH PRIOR TO NEXT REFILL

## 2017-07-11 NOTE — Telephone Encounter (Signed)
Refilled: 04/19/2017 Last OV: 06/15/2017 Next OV: not scheduled Last TSH: 09/18/2016

## 2017-07-12 ENCOUNTER — Encounter: Payer: Self-pay | Admitting: Podiatry

## 2017-07-12 ENCOUNTER — Ambulatory Visit: Payer: PPO | Admitting: Podiatry

## 2017-07-12 VITALS — BP 141/50 | HR 52

## 2017-07-12 DIAGNOSIS — M216X1 Other acquired deformities of right foot: Secondary | ICD-10-CM | POA: Diagnosis not present

## 2017-07-12 DIAGNOSIS — Q828 Other specified congenital malformations of skin: Secondary | ICD-10-CM | POA: Diagnosis not present

## 2017-07-12 DIAGNOSIS — M2041 Other hammer toe(s) (acquired), right foot: Secondary | ICD-10-CM | POA: Diagnosis not present

## 2017-07-12 NOTE — Progress Notes (Signed)
This patient presents to the office with chief complaint of a painful skin lesion on the bottom of his right forefoot.  He says he has a painful callus developed on the right forefoot.  This patient states that this area is becoming increasingly painful when he walks and wears his shoes.  He says he has provided no self treatment nor sought any professional help.  He admits to having surgical correction on the second and third digits, right foot.  These 2 digits are now contracted.  He admits to having orthoses before which had a cut out at the site of the pain.  He presents the office today for an evaluation and treatment of this painful skin lesion.  General Appearance  Alert, conversant and in no acute stress.  Vascular  Dorsalis pedis and posterior tibial  pulses are palpable  bilaterally.  Capillary return is within normal limits  bilaterally. Temperature is within normal limits  bilaterally.  Neurologic  Senn-Weinstein monofilament wire test within normal limits  bilaterally. Muscle power within normal limits bilaterally.  Nails normal nails noted with no evidence of bacterial or fungal infection.  Orthopedic  No limitations of motion of motion feet .  No crepitus or effusions noted.  HAV  B/L.  Hammer toes 2-4 right foot.  Hammer toes 2-5 left foot.  Skin  normotropic skin with no porokeratosis noted bilaterally.  No signs of infections or ulcers noted.   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  IE  Debride porokeratosis.  Discussed the dislocated second digit right foot, which has caused plantar flexion of the second metatarsal.  Padding was dispensed and placed on his right forefoot.  He is to return to the office when necessary and consider permanent orthoses.   Gardiner Barefoot DPM

## 2017-07-16 ENCOUNTER — Ambulatory Visit: Payer: PPO | Admitting: Psychology

## 2017-07-16 DIAGNOSIS — F339 Major depressive disorder, recurrent, unspecified: Secondary | ICD-10-CM

## 2017-07-17 NOTE — Addendum Note (Signed)
Addended by: Adair Laundry on: 07/17/2017 09:48 AM   Modules accepted: Orders

## 2017-07-17 NOTE — Telephone Encounter (Signed)
LMTCB. Need to schedule pt a non fasting lab appt. Lab has been ordered. Pt needs to have thyroid checked again before Dr. Derrel Nip will givehim any more refills on his thyroid medication.

## 2017-07-18 NOTE — Telephone Encounter (Signed)
Lab appt has been scheduled.  

## 2017-07-23 ENCOUNTER — Ambulatory Visit
Admission: RE | Admit: 2017-07-23 | Discharge: 2017-07-23 | Disposition: A | Discharge status: Home / Self Care | Payer: PPO | Source: Ambulatory Visit | Attending: Pulmonary Disease | Admitting: Pulmonary Disease

## 2017-07-23 DIAGNOSIS — R0609 Other forms of dyspnea: Secondary | ICD-10-CM | POA: Insufficient documentation

## 2017-07-23 DIAGNOSIS — R0600 Dyspnea, unspecified: Secondary | ICD-10-CM

## 2017-07-23 DIAGNOSIS — R0602 Shortness of breath: Secondary | ICD-10-CM | POA: Diagnosis not present

## 2017-07-26 ENCOUNTER — Other Ambulatory Visit: Payer: Self-pay

## 2017-07-26 ENCOUNTER — Ambulatory Visit: Payer: PPO | Admitting: Podiatry

## 2017-07-27 ENCOUNTER — Ambulatory Visit (INDEPENDENT_AMBULATORY_CARE_PROVIDER_SITE_OTHER): Payer: PPO | Admitting: Internal Medicine

## 2017-07-27 ENCOUNTER — Telehealth: Payer: Self-pay | Admitting: Internal Medicine

## 2017-07-27 ENCOUNTER — Encounter: Payer: Self-pay | Admitting: Internal Medicine

## 2017-07-27 ENCOUNTER — Telehealth: Payer: Self-pay

## 2017-07-27 VITALS — BP 132/62 | Ht 75.0 in | Wt 182.0 lb

## 2017-07-27 DIAGNOSIS — I739 Peripheral vascular disease, unspecified: Secondary | ICD-10-CM

## 2017-07-27 DIAGNOSIS — I998 Other disorder of circulatory system: Secondary | ICD-10-CM

## 2017-07-27 DIAGNOSIS — R531 Weakness: Secondary | ICD-10-CM | POA: Diagnosis not present

## 2017-07-27 DIAGNOSIS — J449 Chronic obstructive pulmonary disease, unspecified: Secondary | ICD-10-CM | POA: Diagnosis not present

## 2017-07-27 NOTE — Telephone Encounter (Signed)
Patient was seen today by Dr. Mortimer Fries and was told he needed to see Dr. Lucky Cowboy at [place].  Patient was told to call and scheduled an appt.   [facility] Requires a referral from MD prior to scheduling .  Patient aware that if [facility] doesn't call in a week to contact our office .

## 2017-07-27 NOTE — Progress Notes (Signed)
   Name: Theodore [name] MRN: [id number] DOB: Feb 28, 1930     CONSULTATION DATE:  07/27/2017   REFERRING MD : Derrel Nip  CHIEF COMPLAINT:  SOB, follow COPD  HISTORY OF PRESENT ILLNESS:  Alert and awake SOB resolved with time and relaxation No signs of infection  No signs of CHF  Patient states that anxiety is driving his SOB Uses albuterol infrequently Was doing things all day yesterday and no issues  States that he has weakness and wobbly gate with lower ext Recommend vasc surgery referral to assess for ischemia  No signs of infection No signs of CHF  CARIOOPULM STRESS TEST WAS WNL     REVIEW OF SYSTEMS:    Review of Systems:  Gen:  Denies  fever, sweats, chills weigh loss  HEENT: Denies blurred vision, double vision, ear pain, eye pain, hearing loss, nose bleeds, sore throat Cardiac:  No dizziness, chest pain or heaviness, chest tightness,edema Resp:   +cough +sputum production, +shortness of breath,+wheezing, -hemoptysis,  Gi: Denies swallowing difficulty, stomach pain, nausea or vomiting, diarrhea, constipation, bowel incontinence Gu:  Denies bladder incontinence, burning urine Ext:   +leg weakness Skin: Denies  skin rash, easy bruising or bleeding or hives Endoc:  Denies polyuria, polydipsia , polyphagia or weight change Psych:   Denies depression, insomnia or hallucinations  Other:  All other systems negative  ALL OTHER ROS ARE NEGATIVE   BP 132/62 (BP Location: Left Arm, Cuff Size: Normal)   Ht 6\' 3"  (1.905 m)   Wt 182 lb (82.6 kg)   SpO2 99%   BMI 22.75 kg/m   Physical Examination:   GENERAL:NAD, no fevers, chills, no weakness no fatigue HEAD: Normocephalic, atraumatic.  EYES: Pupils equal, round, reactive to light. Extraocular muscles intact. No scleral icterus.  MOUTH: Moist mucosal membrane.   EAR, NOSE, THROAT: Clear without exudates. No external lesions.  NECK: Supple. No thyromegaly. No nodules. No JVD.  PULMONARY:CTA B/L no wheezes, no  crackles, no rhonchi CARDIOVASCULAR: S1 and S2. Regular rate and rhythm. No murmurs, rubs, or gallops. No edema.  GASTROINTESTINAL: Soft, nontender, nondistended. No masses. Positive bowel sounds.  MUSCULOSKELETAL: No swelling, clubbing, or edema. Range of motion full in all extremities.  NEUROLOGIC: Cranial nerves II through XII are intact. No gross focal neurological deficits.  SKIN: No ulceration, lesions, rashes, or cyanosis. Skin warm and dry. Turgor intact.  PSYCHIATRIC: Mood, affect within normal limits. The patient is awake, alert and oriented x 3. Insight, judgment intact.    ASSESSMENT / PLAN: 84 yo white male with SOB and DOE with abnormal PFT's c/w Mild COPD Gold Stage A in setting of allergic rhinitis and probable GERD  1.SOB and DOE from COPD -albuterol as needed  2.COPD-Mild -albuterol as needed Avoid stress   3.allergic Rhinitis -continue  nasal sprays  4.probable GERD-start PPI  5.LOWER EXT WEAKNESS Patient will need to see [place] for LOWER EXT ISCHEMIA Follow up as needed   Patient satisfied with Plan of action and management. All questions answered Follow up in 6 months   Aryn Kops Patricia Pesa, M.D.  [facility]  Medical Director [place] Director [facility] Cardio-Pulmonary Department

## 2017-07-27 NOTE — Telephone Encounter (Signed)
Please advise 

## 2017-07-27 NOTE — Telephone Encounter (Signed)
Copied from [place] [phone] [id number]. Topic: Referral - Question >> Jul 27, 2017 10:49 AM Pricilla Handler wrote: Reason for CRM: Patient called wanting to know if Dr. Derrel Nip would send a referral to Dr. Leotis Pain - Vascular Surgeon. Patient states that he saw Dr. Flora Lipps today which stated that the patient needs to see Dr. Lucky Cowboy. They told the patient that he could call Dr. Bunnie Domino office without a referral. Patient called Dr. Bunnie Domino office, but they stated that the patient will need a referral. Patient would like a call back as to whether Dr. Derrel Nip would send this referral or nor.       Thank You!!!

## 2017-07-27 NOTE — Telephone Encounter (Signed)
Ref placed. Nothing further needed.

## 2017-07-27 NOTE — Patient Instructions (Signed)
ALBUTEROL AS NEEDED    RECOMMEND VASC SURGERY CONSULTATION

## 2017-07-27 NOTE — Telephone Encounter (Signed)
Spoke with pt and informed him that the referral has been placed. Pt gave a verbal understanding.

## 2017-07-27 NOTE — Telephone Encounter (Signed)
Dr Mortimer Fries had already done it ,  So now he has 2 referrals

## 2017-07-27 NOTE — Addendum Note (Signed)
Addended by: [name] on: 07/27/2017 12:54 PM   Modules accepted: Orders

## 2017-07-30 ENCOUNTER — Ambulatory Visit (INDEPENDENT_AMBULATORY_CARE_PROVIDER_SITE_OTHER): Payer: PPO | Admitting: Podiatry

## 2017-07-30 ENCOUNTER — Encounter: Payer: Self-pay | Admitting: Podiatry

## 2017-07-30 DIAGNOSIS — M2041 Other hammer toe(s) (acquired), right foot: Secondary | ICD-10-CM | POA: Diagnosis not present

## 2017-07-30 DIAGNOSIS — M216X1 Other acquired deformities of right foot: Secondary | ICD-10-CM | POA: Diagnosis not present

## 2017-07-30 NOTE — Progress Notes (Signed)
This patient presents to the office with chief complaint of a painful skin lesion on the bottom of his right forefoot.  He was diagnosed with a porokeratosis secondary to a plantar flexed second metatarsal.  Patient states that the pinning worked initially for 3 days and then he needed to stop wearing the pad.  He presents the office today bringing his insoles into the office for reevaluation and treatment of this painful right forefoot.  General Appearance  Alert, conversant and in no acute stress.  Vascular  Dorsalis pedis and posterior tibial  pulses are palpable  bilaterally.  Capillary return is within normal limits  bilaterally. Temperature is within normal limits  bilaterally.  Neurologic  Senn-Weinstein monofilament wire test within normal limits  bilaterally. Muscle power within normal limits bilaterally.  Nails normal nails noted with no evidence of bacterial or fungal infection.  Orthopedic  No limitations of motion of motion feet .  No crepitus or effusions noted.  HAV  B/L.  Hammer toes 2-4 right foot.  Hammer toes 2-5 left foot.  Skin  normotropic skin with no porokeratosis noted bilaterally.  No signs of infections or ulcers noted.   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  ROV.  Adding was placed on his over-the-counter insoles.  Patient stood and said he felt relief and no pain in his right forefoot.  He was told to change the I to relieve his pain.  He is to return to the office when necessary and consider permanent orthoses.   Gardiner Barefoot DPM

## 2017-08-09 ENCOUNTER — Encounter (INDEPENDENT_AMBULATORY_CARE_PROVIDER_SITE_OTHER): Payer: Self-pay | Admitting: Vascular Surgery

## 2017-08-09 ENCOUNTER — Ambulatory Visit (INDEPENDENT_AMBULATORY_CARE_PROVIDER_SITE_OTHER): Payer: PPO | Admitting: Vascular Surgery

## 2017-08-09 VITALS — BP 120/60 | HR 54 | Resp 16 | Ht 75.0 in | Wt 184.0 lb

## 2017-08-09 DIAGNOSIS — E785 Hyperlipidemia, unspecified: Secondary | ICD-10-CM | POA: Diagnosis not present

## 2017-08-09 DIAGNOSIS — R29898 Other symptoms and signs involving the musculoskeletal system: Secondary | ICD-10-CM | POA: Diagnosis not present

## 2017-08-09 NOTE — Progress Notes (Signed)
Subjective:    Patient ID: [name], male    DOB: 1929-04-03, 84 y.o.   MRN: [id number] Chief Complaint  Patient presents with  . New Patient (Initial Visit)    ref Derrel Nip for ischemia   Presents as a new patient referred by Dr. Derrel Nip for bilateral lower extremity weakness.  Patient notes a weakness to his thigh and calf muscles with ambulation for long distances which has progressively worsened over the last year.  Patient does not necessarily experience any "pain" however there is a noticeable weakness.  Patient denies any rest pain or ulceration to the bilateral lower extremity.  Patient denies any recent surgery or trauma to the bilateral lower extremity.  Patient is status post bilateral total knee replacements.  Patient does have a past medical diagnosis of mild COPD.  Patient denies any fever, nausea vomiting.  Review of Systems  Constitutional: Negative.   HENT: Negative.   Eyes: Negative.   Respiratory: Negative.   Cardiovascular:       Thigh and calf weakness  Gastrointestinal: Negative.   Endocrine: Negative.   Genitourinary: Negative.   Musculoskeletal: Negative.   Skin: Negative.   Allergic/Immunologic: Negative.   Neurological: Negative.   Hematological: Negative.   Psychiatric/Behavioral: Negative.       Objective:   Physical Exam  Constitutional: He is oriented to person, place, and time. He appears well-developed and well-nourished. No distress.  HENT:  Head: Normocephalic and atraumatic.  Right Ear: External ear normal.  Left Ear: External ear normal.  Eyes: Pupils are equal, round, and reactive to light. Conjunctivae and EOM are normal.  Neck: Normal range of motion.  Cardiovascular: Normal rate, regular rhythm, normal heart sounds and intact distal pulses.  Pulses:      Radial pulses are 2+ on the right side, and 2+ on the left side.  Hard to palpate pedal pulses however the bilateral feet are warm  Pulmonary/Chest: Effort normal and breath sounds  normal.  Musculoskeletal: Normal range of motion. He exhibits no edema.  Neurological: He is alert and oriented to person, place, and time.  Skin: Skin is warm and dry. He is not diaphoretic.  Psychiatric: He has a normal mood and affect. His behavior is normal. Judgment and thought content normal.  Vitals reviewed.  BP 120/60 (BP Location: Left Arm)   Pulse (!) 54   Resp 16   Ht 6\' 3"  (1.905 m)   Wt 184 lb (83.5 kg)   BMI 23.00 kg/m   Past Medical History:  Diagnosis Date  . Arthritis   . Cancer [facility]) 2002   melanoma right ear  . Colon polyp   . COPD (chronic obstructive pulmonary disease) ([place])    pt said he believes it is a misdiagnosis   . Depression   . History of kidney stones   . Hypothyroidism   . Rhinitis   . Thyroid disease    hypothyroid  . Tremors of nervous system    Benign   Social History   Socioeconomic History  . Marital status: Married    Spouse name: [name]  . Number of children: 4  . Years of education: 6  . Highest education level: Not on file  Occupational History  . Occupation: Retired    Comment: Architect -   Social Needs  . Financial resource strain: Not hard at all  . Food insecurity:    Worry: Never true    Inability: Never true  . Transportation needs:    Medical:  No    Non-medical: No  Tobacco Use  . Smoking status: Never Smoker  . Smokeless tobacco: Never Used  Substance and Sexual Activity  . Alcohol use: Yes    Comment: 1 glass of wine daily  . Drug use: No  . Sexual activity: Not Currently  Lifestyle  . Physical activity:    Days per week: Not on file    Minutes per session: Not on file  . Stress: Not on file  Relationships  . Social connections:    Talks on phone: Not on file    Gets together: Not on file    Attends religious service: Not on file    Active member of club or organization: Not on file    Attends meetings of clubs or organizations: Not on file    Relationship status: Not on file  . Intimate  partner violence:    Fear of current or ex partner: No    Emotionally abused: No    Physically abused: No    Forced sexual activity: No  Other Topics Concern  . Not on file  Social History Narrative   Mr. Scrima grew up in [place], [place]. 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 He and his wife are living at [facility]. He is very active at  [employer] [facility] [employer]. He enjoys music.    Past Surgical History:  Procedure Laterality Date  . ADENOIDECTOMY    . ESOPHAGOGASTRODUODENOSCOPY (EGD) WITH PROPOFOL N/A 10/31/2016   Procedure: ESOPHAGOGASTRODUODENOSCOPY (EGD) WITH PROPOFOL;  Surgeon: Lollie Sails, MD;  Location: [facility] ENDOSCOPY;  Service: Endoscopy;  Laterality: N/A;  . JOINT REPLACEMENT Right 2003   knee  . knee meniscus repair Right   . MELANOMA EXCISION Right    ear. Followed by Dr. Nehemiah Massed  . PATELLECTOMY Bilateral   . TONSILLECTOMY     Family History  Problem Relation Age of Onset  . Hypertension Mother   . Heart disease Mother        CHF  . Heart disease Father   . Cancer Sister        melanoma   Allergies  Allergen Reactions  . Penicillins Rash      Assessment & Plan:  Presents as a new patient referred by Dr. Derrel Nip for bilateral lower extremity weakness.  Patient notes a weakness to his thigh and calf muscles with ambulation for long distances which has progressively worsened over the last year.  Patient does not necessarily experience any "pain" however there is a noticeable weakness.  Patient denies any rest pain or ulceration to the bilateral lower extremity.  Patient denies any recent surgery or trauma to the bilateral lower extremity.  Patient is status post bilateral total knee replacements.  Patient does have a past medical diagnosis of mild COPD.   Patient denies any fever, nausea vomiting.  1. Weakness of both lower extremities - New Patient with progressively worsening thigh and calf claudication with ambulation for over the last year The patient feels that the symptoms have progressed to the point that he is unable to function on a daily basis and they have become lifestyle limiting Patient has hard to palpate pedal pulses on exam I will bring the patient back to undergo a bilateral ABI to assess for any contributing peripheral artery disease I have discussed with the  patient at length the risk factors for and pathogenesis of atherosclerotic disease and encouraged a healthy diet, regular exercise regimen and blood pressure / glucose control.  The patient was encouraged to call the office in the interim if he experiences any claudication like symptoms, rest pain or ulcers to his feet / toes.  - VAS [place] ABI WITH/WO TBI; Future  2. Hyperlipidemia LDL goal <100 - Stable Encouraged good control as its slows the progression of atherosclerotic disease  Current Outpatient Medications on File Prior to Visit  Medication Sig Dispense Refill  . Ascorbic Acid (VITAMIN C) 1000 MG tablet Take 1,000 mg by mouth daily.    . Bilberry, Vaccinium myrtillus, (BILBERRY PO) Take by mouth.    . cholecalciferol (VITAMIN D) 1000 UNITS tablet Take 4,000 Units by mouth daily.     [name] co-enzyme Q-10 30 MG capsule Take 30 mg by mouth daily.     . COD LIVER OIL PO Take by mouth.    . cyanocobalamin 500 MCG tablet Take 500 mcg by mouth daily.    . Flaxseed, Linseed, (FLAX SEEDS PO) Take by mouth.    . fluorometholone (FML) 0.1 % ophthalmic suspension     . fluticasone (FLONASE) 50 MCG/ACT nasal spray Place 2 sprays into both nostrils daily. 16 g 6  . hydrogen peroxide 1.5 % SOLN Apply topically as needed.    . IODINE, KELP, PO Take by mouth.    . levothyroxine (SYNTHROID, LEVOTHROID) 100 MCG tablet Take 1 tablet by mouth once daily before breakfast 90 tablet 0  .  Lifitegrast (XIIDRA) 5 % SOLN Apply to eye.    . LUTEIN PO Take by mouth.    . magnesium oxide (MAG-OX) 400 MG tablet Take 400 mg by mouth daily.    [name] MELATONIN PO Take 1 capsule by mouth.    . mometasone (ELOCON) 0.1 % cream Apply 1 application topically daily.    [name] NEEDLE, DISP, 25 G (EASY TOUCH FLIPLOCK NEEDLES) 25G X 1" MISC Inject 1 application into the muscle once a week. Use for b12 injections 12 each 0  . OMEGA-3 FATTY ACIDS PO Take 300 mg by mouth daily.    [name] OVER THE COUNTER MEDICATION Tumeric daily    . OVER THE COUNTER MEDICATION Take 4 tablets by mouth 2 (two) times daily. Standard Process- Cardio Plus     . OVER THE COUNTER MEDICATION Take 4 tablets by mouth 2 (two) times daily. Standard Process- Cataplex     . OVER THE COUNTER MEDICATION Take by mouth daily. Hemp oil    . Polyethyl Glycol-Propyl Glycol (SYSTANE OP) Apply to eye.    . Probiotic Product (PROBIOTIC ACIDOPHILUS) CAPS Take 1 capsule by mouth daily.    . RESTASIS 0.05 % ophthalmic emulsion Place 1 drop into both eyes 2 (two) times daily.    . S-Adenosylmethionine (SAM-E PO) Take by mouth.    . SYRINGE-NEEDLE, DISP, 3 ML (LUER LOCK SAFETY SYRINGES) 25G X 1" 3 ML MISC Inject 1 application into the muscle once a week. Use to for b12 injections 12 each 0  . Syringe/Needle, Disp, (SYRINGE 3CC/25GX1") 25G X 1" 3 ML MISC Use for b12 injections 50 each 0  . testosterone cypionate (DEPOTESTOSTERONE CYPIONATE) 200 MG/ML injection INJECT 0.50 MLS INTO THE MUSCLE EVERY 14 DAYS 10 mL 0  . TURMERIC PO Take by mouth.    [name] UNABLE TO FIND Take 4 tablets by mouth 2 (two) times daily. Med Name: Standard process- Cyruta Plus    .  vitamin E (VITAMIN E) 400 UNIT capsule Take 400 Units by mouth daily.     No current facility-administered medications on file prior to visit.    There are no Patient Instructions on file for this visit. No follow-ups on file.  Shenna Brissette A Lorelai Huyser, PA-C

## 2017-08-13 ENCOUNTER — Ambulatory Visit: Payer: PPO | Admitting: Psychology

## 2017-08-13 DIAGNOSIS — F339 Major depressive disorder, recurrent, unspecified: Secondary | ICD-10-CM | POA: Diagnosis not present

## 2017-08-22 ENCOUNTER — Ambulatory Visit: Payer: PPO | Admitting: Podiatry

## 2017-08-23 ENCOUNTER — Ambulatory Visit: Payer: PPO | Admitting: Podiatry

## 2017-08-24 ENCOUNTER — Other Ambulatory Visit (INDEPENDENT_AMBULATORY_CARE_PROVIDER_SITE_OTHER): Payer: PPO

## 2017-08-24 DIAGNOSIS — E039 Hypothyroidism, unspecified: Secondary | ICD-10-CM

## 2017-08-24 LAB — TSH: TSH: 1.48 u[IU]/mL (ref 0.35–4.50)

## 2017-08-27 ENCOUNTER — Ambulatory Visit: Payer: PPO | Admitting: Psychology

## 2017-08-27 ENCOUNTER — Ambulatory Visit: Payer: PPO | Admitting: Podiatry

## 2017-08-27 ENCOUNTER — Encounter: Payer: Self-pay | Admitting: Podiatry

## 2017-08-27 DIAGNOSIS — F339 Major depressive disorder, recurrent, unspecified: Secondary | ICD-10-CM

## 2017-08-27 DIAGNOSIS — M2041 Other hammer toe(s) (acquired), right foot: Secondary | ICD-10-CM | POA: Diagnosis not present

## 2017-08-27 DIAGNOSIS — M216X1 Other acquired deformities of right foot: Secondary | ICD-10-CM

## 2017-08-27 DIAGNOSIS — Q828 Other specified congenital malformations of skin: Secondary | ICD-10-CM | POA: Diagnosis not present

## 2017-08-27 NOTE — Progress Notes (Signed)
This patient presents to the office with chief complaint of a painful skin lesion on the bottom of his right forefoot.  He was diagnosed with a porokeratosis secondary to a plantar flexed second metatarsal.  Patient states that the padding that was applied to his arch support in his right foot does not appear to be properly positioned.  General Appearance  Alert, conversant and in no acute stress.  Vascular  Dorsalis pedis and posterior tibial  pulses are palpable  bilaterally.  Capillary return is within normal limits  bilaterally. Temperature is within normal limits  bilaterally.  Neurologic  Senn-Weinstein monofilament wire test within normal limits  bilaterally. Muscle power within normal limits bilaterally.  Nails normal nails noted with no evidence of bacterial or fungal infection.  Orthopedic  No limitations of motion of motion feet .  No crepitus or effusions noted.  HAV  B/L.  Hammer toes 2-4 right foot.  Hammer toes 2-5 left foot.  Skin  normotropic skin with no porokeratosis noted bilaterally.  No signs of infections or ulcers noted.   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  ROV.  Examination of his padding reveals the position is in correct.  Using Betadine to mark the insole the correct position of the padding was applied to the insole.  Patient was told if this  Padding works,, then he should return to the office to see Liliane Channel  for permanent insoles.  He is to return to the office when necessary and consider permanent orthoses.   Gardiner Barefoot DPM

## 2017-08-30 ENCOUNTER — Other Ambulatory Visit: Payer: PPO | Admitting: Orthotics

## 2017-09-05 ENCOUNTER — Other Ambulatory Visit: Payer: Self-pay | Admitting: Internal Medicine

## 2017-09-10 ENCOUNTER — Ambulatory Visit: Payer: PPO | Admitting: Psychology

## 2017-09-10 DIAGNOSIS — F339 Major depressive disorder, recurrent, unspecified: Secondary | ICD-10-CM | POA: Diagnosis not present

## 2017-09-24 ENCOUNTER — Ambulatory Visit: Payer: PPO | Admitting: Psychology

## 2017-09-24 DIAGNOSIS — F339 Major depressive disorder, recurrent, unspecified: Secondary | ICD-10-CM

## 2017-09-26 ENCOUNTER — Encounter

## 2017-09-26 ENCOUNTER — Encounter (INDEPENDENT_AMBULATORY_CARE_PROVIDER_SITE_OTHER): Payer: Self-pay | Admitting: Vascular Surgery

## 2017-09-26 ENCOUNTER — Ambulatory Visit (INDEPENDENT_AMBULATORY_CARE_PROVIDER_SITE_OTHER): Payer: PPO

## 2017-09-26 ENCOUNTER — Ambulatory Visit (INDEPENDENT_AMBULATORY_CARE_PROVIDER_SITE_OTHER): Payer: PPO | Admitting: Nurse Practitioner

## 2017-09-26 VITALS — BP 127/60 | HR 60 | Resp 16 | Ht 75.0 in | Wt 180.0 lb

## 2017-09-26 DIAGNOSIS — E785 Hyperlipidemia, unspecified: Secondary | ICD-10-CM | POA: Diagnosis not present

## 2017-09-26 DIAGNOSIS — R29898 Other symptoms and signs involving the musculoskeletal system: Secondary | ICD-10-CM | POA: Diagnosis not present

## 2017-09-26 DIAGNOSIS — I739 Peripheral vascular disease, unspecified: Secondary | ICD-10-CM | POA: Diagnosis not present

## 2017-09-26 NOTE — Progress Notes (Signed)
Subjective:    Patient ID: Theodore [name], male    DOB: 06-04-1929, 84 y.o.   MRN: [id number] Chief Complaint  Patient presents with  . Follow-up    pt conv abi    HPI  Theodore [name] is a 84 year old male who is following up today for weakness of his bilateral extremities.  He explains his symptoms as having the strength to walk about a walk upstairs, however after so long he feels a "tiredness" that does not allow him to continue.  He does not endorse his legs giving out suddenly.  He denies any pain, cramping, or tightness of his calves or thighs.  He denies any wounds to his lower legs or feet.  The patient does endorse having a history of spinal stenosis on his left side.  He denies any  nausea, vomiting, fever, or diarrhea.  Today the patient had a low bilateral lower extremity duplex study, which revealed that on his right leg his resting ABI indicates moderate disease, with a ABI of 0.72.  Also of note, the distal posterior tibial artery at the ankle is occluded by duplex, there is a collateral seen in the anterior tibial feeding into the distal posterior tibial just proximal to the ankle.  The popliteal artery is widely patent.  On the left there is a resting ABI which also indicates moderate arterial disease with an ABI of 0.65.  The popliteal is also widely patent on the left.   Review of Systems  Constitutional: Negative.  Negative for activity change, fatigue and fever.  HENT: Negative.   Eyes: Negative.   Respiratory: Negative.   Cardiovascular: Negative.   Gastrointestinal: Negative for diarrhea, nausea and vomiting.  Endocrine: Negative.   Genitourinary: Negative.   Musculoskeletal: Negative.   Skin: Negative.   Neurological: Positive for weakness. Negative for syncope and numbness.  Hematological: Negative.   Psychiatric/Behavioral: The patient is nervous/anxious.           Objective:   Physical Exam  Constitutional: He is oriented to person, place, and time. He  appears well-developed and well-nourished.  HENT:  Head: Normocephalic.  Eyes: Pupils are equal, round, and reactive to light. EOM are normal.  Neck: Normal range of motion. No JVD present. No tracheal deviation present.  Cardiovascular: Normal rate, regular rhythm, S1 normal, S2 normal and intact distal pulses.  Pulses:      Posterior tibial pulses are 2+ on the right side, and 2+ on the left side.  Pulmonary/Chest: Effort normal and breath sounds normal.  Musculoskeletal: Normal range of motion. He exhibits no edema, tenderness or deformity.  Neurological: He is alert and oriented to person, place, and time. No sensory deficit. Coordination normal.  Skin: Skin is warm and dry.  Psychiatric: He has a normal mood and affect. His behavior is normal. Judgment and thought content normal.      BP 127/60 (BP Location: Right Arm)   Pulse 60   Resp 16   Ht 6\' 3"  (1.905 m)   Wt 180 lb (81.6 kg)   BMI 22.50 kg/m   Past Medical History:  Diagnosis Date  . Arthritis   . Cancer [facility]) 2002   melanoma right ear  . Colon polyp   . COPD (chronic obstructive pulmonary disease) ([place])    pt said he believes it is a misdiagnosis   . Depression   . History of kidney stones   . Hypothyroidism   . Rhinitis   . Thyroid disease    hypothyroid  .  Tremors of nervous system    Benign    Social History   Socioeconomic History  . Marital status: Married    Spouse name: [name]  . Number of children: 4  . Years of education: 74  . Highest education level: Not on file  Occupational History  . Occupation: Retired    Comment: Architect -   Social Needs  . Financial resource strain: Not hard at all  . Food insecurity:    Worry: Never true    Inability: Never true  . Transportation needs:    Medical: No    Non-medical: No  Tobacco Use  . Smoking status: Never Smoker  . Smokeless tobacco: Never Used  Substance and Sexual Activity  . Alcohol use: Yes    Comment: 1 glass of wine daily    . Drug use: No  . Sexual activity: Not Currently  Lifestyle  . Physical activity:    Days per week: Not on file    Minutes per session: Not on file  . Stress: Not on file  Relationships  . Social connections:    Talks on phone: Not on file    Gets together: Not on file    Attends religious service: Not on file    Active member of club or organization: Not on file    Attends meetings of clubs or organizations: Not on file    Relationship status: Not on file  . Intimate partner violence:    Fear of current or ex partner: No    Emotionally abused: No    Physically abused: No    Forced sexual activity: No  Other Topics Concern  . Not on file  Social History Narrative   Mr. Puls grew up in [place], [place]. 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 He and his wife are living at [facility]. He is very active at [employer]. He enjoys music.     Past Surgical History:  Procedure Laterality Date  . ADENOIDECTOMY    . ESOPHAGOGASTRODUODENOSCOPY (EGD) WITH PROPOFOL N/A 10/31/2016   Procedure: ESOPHAGOGASTRODUODENOSCOPY (EGD) WITH PROPOFOL;  Surgeon: Lollie Sails, MD;  Location: [facility] ENDOSCOPY;  Service: Endoscopy;  Laterality: N/A;  . JOINT REPLACEMENT Right 2003   knee  . knee meniscus repair Right   . MELANOMA EXCISION Right    ear. Followed by Dr. Nehemiah Massed  . PATELLECTOMY Bilateral   . TONSILLECTOMY      Family History  Problem Relation Age of Onset  . Hypertension Mother   . Heart disease Mother        CHF  . Heart disease Father   . Cancer Sister        melanoma    Allergies  Allergen Reactions  . Penicillins Rash       Assessment & Plan:  Theodore [name] is a 84 year old male who is following up today for weakness of his bilateral extremities.   He explains his symptoms as having the strength to walk about a walk upstairs, however after so long he feels a "tiredness" that does not allow him to continue.  He does not endorse his legs giving out suddenly.  He denies any pain, cramping, or tightness of his calves or thighs.  He denies any wounds to his lower legs or feet.  The patient does endorse  having a history of spinal stenosis on his left side.  He denies any  nausea, vomiting, fever, or diarrhea.  Today the patient had a low bilateral lower extremity duplex study, which revealed that on his right leg his resting ABI indicates moderate disease, with a ABI of 0.72.  Also of note, the distal posterior tibial artery at the ankle is occluded by duplex, there is a collateral seen in the anterior tibial feeding into the distal posterior tibial just proximal to the ankle.  The popliteal artery is widely patent.  On the left there is a resting ABI which also indicates moderate arterial disease with an ABI of 0.65.  The popliteal is also widely patent on the left.   1. Peripheral artery disease (HCC)-New  Theodore [name]'s lower extremity duplex and ABIs are consistent with moderate peripheral artery disease in each leg.  However, his symptoms are not typically consistent with what is seen in ischemic peripheral artery disease.  Theodore [name] is free from ulcers, rest pain, and claudication.  The weakness or tiredness that Theodore [name] describes is likely multifactorial.  Given that Theodore [name] has a history of spinal stenosis on his left, it was suggested that he follow-up with his primary care physician to obtain x-rays of his lumbar spine to rule out any possible worsening stenosis.  If the x-rays ruled out any further worsening of spinal stenosis, would be willing to do a diagnostic angiogram if this is something that he and his wife wish to proceed with.  The pathophysiology and risk factors of peripheral artery disease were discussed extensively with Mr.  Tamala [name] and his wife.  The angiogram procedure was also discussed extensively with Theodore [name] and his wife, and the collective decision was made to look into less invasive options (x-ray), before moving into more invasive options.  However, should Theodore [name] and his wife decide to move forward with an angiogram the risk and benefits of the procedure were explained and the patient and his wife understood.  They were also given reading materials as well.  In the meantime, the suggestion was to optimize medical therapy to slow progression of peripheral artery disease by adding aspirin 81 mg as well as a statin.  It was felt that this management decision was best left his primary care physician and we will allow them to make the decision whether to start these medications as well as further management.  We will have Theodore [name] follow-up, regardless of whether he moves forward to the angiogram, in 6 months to check progression of his peripheral artery disease.  - VAS [place] LOWER EXTREMITY ARTERIAL DUPLEX; Future - VAS [place] ABI WITH/WO TBI; Future  2. Weakness of both lower extremities-Stable See plan above  3. Hyperlipidemia LDL goal <100-Stable Will recommend optimizing medical therapy for hyperlipidemia.  Will refer this to the primary care physician as to whether this is the right choice for the patient.  Will refer for continued management of this issue.   Current Outpatient Medications on File Prior to Visit  Medication Sig Dispense Refill  . Ascorbic Acid (VITAMIN C) 1000 MG tablet Take 1,000 mg by mouth daily.    . Bilberry, Vaccinium myrtillus, (BILBERRY PO) Take by mouth.    . cholecalciferol (VITAMIN D) 1000 UNITS tablet Take 4,000 Units by mouth daily.     [name] co-enzyme Q-10 30 MG capsule Take 30 mg by mouth daily.     . COD LIVER OIL PO Take by mouth.    [name]  cyanocobalamin 500 MCG tablet Take 500 mcg by mouth daily.    . Flaxseed, Linseed, (FLAX SEEDS PO) Take by mouth.    . fluorometholone (FML)  0.1 % ophthalmic suspension     . fluticasone (FLONASE) 50 MCG/ACT nasal spray Place 2 sprays into both nostrils daily. 16 g 6  . hydrogen peroxide 1.5 % SOLN Apply topically as needed.    . IODINE, KELP, PO Take by mouth.    . levothyroxine (SYNTHROID, LEVOTHROID) 100 MCG tablet Take 1 tablet by mouth once daily before breakfast 90 tablet 1  . Lifitegrast (XIIDRA) 5 % SOLN Apply to eye.    . LUTEIN PO Take by mouth.    . magnesium oxide (MAG-OX) 400 MG tablet Take 400 mg by mouth daily.    [name] MELATONIN PO Take 1 capsule by mouth.    . mometasone (ELOCON) 0.1 % cream Apply 1 application topically daily.    [name] NEEDLE, DISP, 25 G (EASY TOUCH FLIPLOCK NEEDLES) 25G X 1" MISC Inject 1 application into the muscle once a week. Use for b12 injections 12 each 0  . OMEGA-3 FATTY ACIDS PO Take 300 mg by mouth daily.    [name] OVER THE COUNTER MEDICATION Tumeric daily    . OVER THE COUNTER MEDICATION Take 4 tablets by mouth 2 (two) times daily. Standard Process- Cardio Plus     . OVER THE COUNTER MEDICATION Take 4 tablets by mouth 2 (two) times daily. Standard Process- Cataplex     . OVER THE COUNTER MEDICATION Take by mouth daily. Hemp oil    . Polyethyl Glycol-Propyl Glycol (SYSTANE OP) Apply to eye.    . Probiotic Product (PROBIOTIC ACIDOPHILUS) CAPS Take 1 capsule by mouth daily.    . RESTASIS 0.05 % ophthalmic emulsion Place 1 drop into both eyes 2 (two) times daily.    . S-Adenosylmethionine (SAM-E PO) Take by mouth.    . SYRINGE-NEEDLE, DISP, 3 ML (LUER LOCK SAFETY SYRINGES) 25G X 1" 3 ML MISC Inject 1 application into the muscle once a week. Use to for b12 injections 12 each 0  . Syringe/Needle, Disp, (SYRINGE 3CC/25GX1") 25G X 1" 3 ML MISC Use for b12 injections 50 each 0  . testosterone cypionate (DEPOTESTOSTERONE CYPIONATE) 200 MG/ML injection INJECT 0.50 MLS INTO THE MUSCLE EVERY 14 DAYS 10 mL 0  . TURMERIC PO Take by mouth.    [name] UNABLE TO FIND Take 4 tablets by mouth 2 (two) times daily. Med Name:  Standard process- Cyruta Plus    . vitamin E (VITAMIN E) 400 UNIT capsule Take 400 Units by mouth daily.     No current facility-administered medications on file prior to visit.     There are no Patient Instructions on file for this visit. No follow-ups on file.   Kris Hartmann, NP

## 2017-10-02 DIAGNOSIS — H2513 Age-related nuclear cataract, bilateral: Secondary | ICD-10-CM | POA: Diagnosis not present

## 2017-10-15 ENCOUNTER — Ambulatory Visit: Payer: PPO | Admitting: Psychology

## 2017-10-15 DIAGNOSIS — F339 Major depressive disorder, recurrent, unspecified: Secondary | ICD-10-CM | POA: Diagnosis not present

## 2017-10-16 ENCOUNTER — Other Ambulatory Visit: Payer: Self-pay

## 2017-10-16 ENCOUNTER — Encounter: Payer: Self-pay | Admitting: *Deleted

## 2017-10-16 DIAGNOSIS — H2512 Age-related nuclear cataract, left eye: Secondary | ICD-10-CM | POA: Diagnosis not present

## 2017-10-18 NOTE — Discharge Instructions (Signed)

## 2017-10-19 NOTE — Anesthesia Preprocedure Evaluation (Addendum)
Anesthesia Evaluation  Patient identified by MRN, date of birth, ID band Patient awake    Reviewed: Allergy & Precautions, NPO status , Patient's Chart, lab work & pertinent test results  History of Anesthesia Complications Negative for: history of anesthetic complications  Airway Mallampati: I  TM Distance: >3 FB Neck ROM: Full    Dental  (+)    Pulmonary COPD,    Pulmonary exam normal breath sounds clear to auscultation       Cardiovascular Exercise Tolerance: Good + Peripheral Vascular Disease (bilateral LE)  Normal cardiovascular exam Rhythm:Regular Rate:Normal  ECG 06/29/17: SR with frequent PVCs; possible left atrial enlargement  Myocardial perfusion 05/16/16:  Low risk study without evidence of ischemia or scar. The left ventricular ejection fraction is hyperdynamic (>65%). Sensitivity and specificity of the study are degraded by extracardiac activity.   Neuro/Psych PSYCHIATRIC DISORDERS Anxiety HOH    GI/Hepatic GERD  ,  Endo/Other  Hypothyroidism   Renal/GU Renal disease (hx nephrolithiasis)     Musculoskeletal  (+) Arthritis , Osteoarthritis,    Abdominal   Peds  Hematology negative hematology ROS (+)   Anesthesia Other Findings   Reproductive/Obstetrics                            Anesthesia Physical Anesthesia Plan  ASA: III  Anesthesia Plan: MAC   Post-op Pain Management:    Induction: Intravenous  PONV Risk Score and Plan: 1 and TIVA  Airway Management Planned: Natural Airway  Additional Equipment:   Intra-op Plan:   Post-operative Plan:   Informed Consent: I have reviewed the patients History and Physical, chart, labs and discussed the procedure including the risks, benefits and alternatives for the proposed anesthesia with the patient or authorized representative who has indicated his/her understanding and acceptance.     Plan Discussed with:  CRNA  Anesthesia Plan Comments:        Anesthesia Quick Evaluation

## 2017-10-22 ENCOUNTER — Ambulatory Visit: Payer: PPO | Admitting: Psychology

## 2017-10-22 ENCOUNTER — Ambulatory Visit: Payer: PPO | Admitting: Anesthesiology

## 2017-10-22 ENCOUNTER — Ambulatory Visit
Admission: RE | Admit: 2017-10-22 | Discharge: 2017-10-22 | Disposition: A | Discharge status: Home / Self Care | Payer: PPO | Source: Ambulatory Visit | Attending: Ophthalmology | Admitting: Ophthalmology

## 2017-10-22 ENCOUNTER — Encounter
Admission: RE | Disposition: A | Discharge status: Home / Self Care | Payer: Self-pay | Source: Ambulatory Visit | Attending: Ophthalmology

## 2017-10-22 DIAGNOSIS — Z79890 Hormone replacement therapy (postmenopausal): Secondary | ICD-10-CM | POA: Insufficient documentation

## 2017-10-22 DIAGNOSIS — H25812 Combined forms of age-related cataract, left eye: Secondary | ICD-10-CM | POA: Diagnosis not present

## 2017-10-22 DIAGNOSIS — I739 Peripheral vascular disease, unspecified: Secondary | ICD-10-CM | POA: Diagnosis not present

## 2017-10-22 DIAGNOSIS — Z79899 Other long term (current) drug therapy: Secondary | ICD-10-CM | POA: Diagnosis not present

## 2017-10-22 DIAGNOSIS — Z96659 Presence of unspecified artificial knee joint: Secondary | ICD-10-CM | POA: Diagnosis not present

## 2017-10-22 DIAGNOSIS — H2512 Age-related nuclear cataract, left eye: Secondary | ICD-10-CM | POA: Insufficient documentation

## 2017-10-22 DIAGNOSIS — E890 Postprocedural hypothyroidism: Secondary | ICD-10-CM | POA: Insufficient documentation

## 2017-10-22 DIAGNOSIS — Z85828 Personal history of other malignant neoplasm of skin: Secondary | ICD-10-CM | POA: Insufficient documentation

## 2017-10-22 DIAGNOSIS — J449 Chronic obstructive pulmonary disease, unspecified: Secondary | ICD-10-CM | POA: Diagnosis not present

## 2017-10-22 HISTORY — DX: Cardiac arrhythmia, unspecified: I49.9

## 2017-10-22 HISTORY — DX: Presence of external hearing-aid: Z97.4

## 2017-10-22 HISTORY — PX: CATARACT EXTRACTION W/PHACO: SHX586

## 2017-10-22 HISTORY — DX: Presence of dental prosthetic device (complete) (partial): Z97.2

## 2017-10-22 SURGERY — PHACOEMULSIFICATION, CATARACT, WITH IOL INSERTION
Anesthesia: Monitor Anesthesia Care | Site: Eye | Laterality: Left | Wound class: "Clean "

## 2017-10-22 MED ORDER — ACETAMINOPHEN 160 MG/5ML PO SOLN: 325.0000 mg | ORAL | Status: DC | PRN

## 2017-10-22 MED ORDER — ONDANSETRON HCL 4 MG/2ML IJ SOLN: 4.0000 mg | Freq: Once | INTRAMUSCULAR | Status: DC | PRN

## 2017-10-22 MED ORDER — ACETAMINOPHEN 325 MG PO TABS: 650.0000 mg | ORAL_TABLET | Freq: Once | ORAL | Status: DC | PRN

## 2017-10-22 MED ORDER — MOXIFLOXACIN HCL 0.5 % OP SOLN
OPHTHALMIC | Status: DC | PRN
  Administered 2017-10-22: 0.2 mL via OPHTHALMIC

## 2017-10-22 MED ORDER — FENTANYL CITRATE (PF) 100 MCG/2ML IJ SOLN
INTRAMUSCULAR | Status: DC | PRN
  Administered 2017-10-22 (×2): 50 ug via INTRAVENOUS

## 2017-10-22 MED ORDER — LIDOCAINE HCL (PF) 2 % IJ SOLN
INTRAOCULAR | Status: DC | PRN
  Administered 2017-10-22: 1 mL via INTRAOCULAR

## 2017-10-22 MED ORDER — SODIUM HYALURONATE 10 MG/ML IO SOLN
INTRAOCULAR | Status: DC | PRN
  Administered 2017-10-22: 0.55 mL via INTRAOCULAR

## 2017-10-22 MED ORDER — SODIUM HYALURONATE 23 MG/ML IO SOLN
INTRAOCULAR | Status: DC | PRN
  Administered 2017-10-22: 0.6 mL via INTRAOCULAR

## 2017-10-22 MED ORDER — EPINEPHRINE PF 1 MG/ML IJ SOLN
INTRAOCULAR | Status: DC | PRN
  Administered 2017-10-22: 119 mL via OPHTHALMIC

## 2017-10-22 MED ORDER — MIDAZOLAM HCL 2 MG/2ML IJ SOLN
INTRAMUSCULAR | Status: DC | PRN
  Administered 2017-10-22 (×2): 1 mg via INTRAVENOUS

## 2017-10-22 MED ORDER — ARMC OPHTHALMIC DILATING DROPS
1.0000 "application " | OPHTHALMIC | Status: DC | PRN
  Administered 2017-10-22 (×2): 1 via OPHTHALMIC

## 2017-10-22 MED ORDER — LACTATED RINGERS IV SOLN: INTRAVENOUS | Status: DC

## 2017-10-22 SURGICAL SUPPLY — 17 items

## 2017-10-22 NOTE — Transfer of Care (Signed)
Immediate Anesthesia Transfer of Care Note  Patient: Theodore [name]  Procedure(s) Performed: CATARACT EXTRACTION PHACO AND INTRAOCULAR LENS PLACEMENT (IOC)  LEFT (Left Eye)  Patient Location: PACU  Anesthesia Type: MAC  Level of Consciousness: awake, alert  and patient cooperative  Airway and Oxygen Therapy: Patient Spontanous Breathing and Patient connected to supplemental oxygen  Post-op Assessment: Post-op Vital signs reviewed, Patient's Cardiovascular Status Stable, Respiratory Function Stable, Patent Airway and No signs of Nausea or vomiting  Post-op Vital Signs: Reviewed and stable  Complications: No apparent anesthesia complications

## 2017-10-22 NOTE — Op Note (Signed)
OPERATIVE NOTE  LOTHAR PREHN [id number] 10/22/2017   PREOPERATIVE DIAGNOSIS:  Nuclear sclerotic cataract left eye.  H25.12   POSTOPERATIVE DIAGNOSIS:    Nuclear sclerotic cataract left eye.     PROCEDURE:  Phacoemusification with posterior chamber intraocular lens placement of the left eye   LENS:   Implant Name Type Inv. Item Serial No. Manufacturer Lot No. LRB No. Used  LENS IOL DIOP 22.0 - [id number] Intraocular Lens LENS IOL DIOP 22.0 [id number] AMO  Left 1       PCB00 +22.0   ULTRASOUND TIME: 0 minutes 59 seconds.  CDE 9.29   SURGEON:  Benay Pillow, MD, MPH   ANESTHESIA:  Topical with tetracaine drops augmented with 1% preservative-free intracameral lidocaine.  ESTIMATED BLOOD LOSS: <1 mL   COMPLICATIONS:  None.   DESCRIPTION OF PROCEDURE:  The patient was identified in the holding room and transported to the operating room and placed in the supine position under the operating microscope.  The left eye was identified as the operative eye and it was prepped and draped in the usual sterile ophthalmic fashion.   A 1.0 millimeter clear-corneal paracentesis was made at the 5:00 position. 0.5 ml of preservative-free 1% lidocaine with epinephrine was injected into the anterior chamber.  The anterior chamber was filled with Healon 5 viscoelastic.  A 2.4 millimeter keratome was used to make a near-clear corneal incision at the 2:00 position.  A curvilinear capsulorrhexis was made with a cystotome and capsulorrhexis forceps.  Balanced salt solution was used to hydrodissect and hydrodelineate the nucleus.   Phacoemulsification was then used in stop and chop fashion to remove the lens nucleus and epinucleus.  The remaining cortex was then removed using the irrigation and aspiration handpiece. Healon was then placed into the capsular bag to distend it for lens placement.  A lens was then injected into the capsular bag.  The remaining viscoelastic was aspirated.   Wounds were hydrated  with balanced salt solution.  The anterior chamber was inflated to a physiologic pressure with balanced salt solution.  Intracameral vigamox 0.1 mL undiltued was injected into the eye and a drop placed onto the ocular surface.  No wound leaks were noted.  The patient was taken to the recovery room in stable condition without complications of anesthesia or surgery  Benay Pillow 10/22/2017, 9:15 AM

## 2017-10-22 NOTE — H&P (Signed)
The History and Physical notes are on paper, have been signed, and are to be scanned.   I have examined the patient and there are no changes to the H&P.   Benay Pillow 10/22/2017 8:36 AM

## 2017-10-22 NOTE — Anesthesia Postprocedure Evaluation (Signed)
Anesthesia Post Note  Patient: Theodore [name]  Procedure(s) Performed: CATARACT EXTRACTION PHACO AND INTRAOCULAR LENS PLACEMENT (IOC)  LEFT (Left Eye)  Patient location during evaluation: PACU Anesthesia Type: MAC Level of consciousness: awake and alert, oriented and patient cooperative Pain management: pain level controlled Vital Signs Assessment: post-procedure vital signs reviewed and stable Respiratory status: spontaneous breathing, nonlabored ventilation and respiratory function stable Cardiovascular status: blood pressure returned to baseline and stable Postop Assessment: adequate PO intake Anesthetic complications: no    Darrin Nipper

## 2017-10-23 ENCOUNTER — Encounter: Payer: Self-pay | Admitting: Ophthalmology

## 2017-10-23 ENCOUNTER — Other Ambulatory Visit: Payer: Self-pay | Admitting: Internal Medicine

## 2017-10-23 DIAGNOSIS — D229 Melanocytic nevi, unspecified: Secondary | ICD-10-CM | POA: Diagnosis not present

## 2017-10-23 DIAGNOSIS — Z1283 Encounter for screening for malignant neoplasm of skin: Secondary | ICD-10-CM | POA: Diagnosis not present

## 2017-10-23 DIAGNOSIS — D485 Neoplasm of uncertain behavior of skin: Secondary | ICD-10-CM | POA: Diagnosis not present

## 2017-10-23 DIAGNOSIS — L821 Other seborrheic keratosis: Secondary | ICD-10-CM | POA: Diagnosis not present

## 2017-10-23 DIAGNOSIS — Z85820 Personal history of malignant melanoma of skin: Secondary | ICD-10-CM | POA: Diagnosis not present

## 2017-10-23 DIAGNOSIS — L578 Other skin changes due to chronic exposure to nonionizing radiation: Secondary | ICD-10-CM | POA: Diagnosis not present

## 2017-10-23 DIAGNOSIS — D225 Melanocytic nevi of trunk: Secondary | ICD-10-CM | POA: Diagnosis not present

## 2017-10-23 DIAGNOSIS — L820 Inflamed seborrheic keratosis: Secondary | ICD-10-CM | POA: Diagnosis not present

## 2017-10-23 DIAGNOSIS — D1800 Hemangioma unspecified site: Secondary | ICD-10-CM | POA: Diagnosis not present

## 2017-10-23 NOTE — Telephone Encounter (Signed)
Refilled: 03/27/2017 Last OV: 06/15/2017 Next OV: not scheduled Last Testosterone Level: 03/22/2017 - 406

## 2017-10-29 ENCOUNTER — Ambulatory Visit: Payer: PPO | Admitting: Psychology

## 2017-10-29 DIAGNOSIS — F339 Major depressive disorder, recurrent, unspecified: Secondary | ICD-10-CM

## 2017-11-13 ENCOUNTER — Other Ambulatory Visit: Payer: Self-pay

## 2017-11-13 ENCOUNTER — Encounter: Payer: Self-pay | Admitting: *Deleted

## 2017-11-13 DIAGNOSIS — H2511 Age-related nuclear cataract, right eye: Secondary | ICD-10-CM | POA: Diagnosis not present

## 2017-11-15 NOTE — Discharge Instructions (Signed)

## 2017-11-19 ENCOUNTER — Ambulatory Visit: Payer: PPO | Admitting: Psychology

## 2017-11-19 DIAGNOSIS — F339 Major depressive disorder, recurrent, unspecified: Secondary | ICD-10-CM

## 2017-11-20 ENCOUNTER — Ambulatory Visit: Payer: PPO | Admitting: Anesthesiology

## 2017-11-20 ENCOUNTER — Encounter
Admission: RE | Disposition: A | Discharge status: Home / Self Care | Payer: Self-pay | Source: Ambulatory Visit | Attending: Ophthalmology

## 2017-11-20 ENCOUNTER — Ambulatory Visit
Admission: RE | Admit: 2017-11-20 | Discharge: 2017-11-20 | Disposition: A | Discharge status: Home / Self Care | Payer: PPO | Source: Ambulatory Visit | Attending: Ophthalmology | Admitting: Ophthalmology

## 2017-11-20 DIAGNOSIS — F329 Major depressive disorder, single episode, unspecified: Secondary | ICD-10-CM | POA: Diagnosis not present

## 2017-11-20 DIAGNOSIS — J449 Chronic obstructive pulmonary disease, unspecified: Secondary | ICD-10-CM | POA: Diagnosis not present

## 2017-11-20 DIAGNOSIS — Z85828 Personal history of other malignant neoplasm of skin: Secondary | ICD-10-CM | POA: Insufficient documentation

## 2017-11-20 DIAGNOSIS — E7800 Pure hypercholesterolemia, unspecified: Secondary | ICD-10-CM | POA: Insufficient documentation

## 2017-11-20 DIAGNOSIS — H2511 Age-related nuclear cataract, right eye: Secondary | ICD-10-CM | POA: Insufficient documentation

## 2017-11-20 DIAGNOSIS — Z79899 Other long term (current) drug therapy: Secondary | ICD-10-CM | POA: Insufficient documentation

## 2017-11-20 DIAGNOSIS — H25811 Combined forms of age-related cataract, right eye: Secondary | ICD-10-CM | POA: Diagnosis not present

## 2017-11-20 DIAGNOSIS — G250 Essential tremor: Secondary | ICD-10-CM | POA: Insufficient documentation

## 2017-11-20 DIAGNOSIS — E039 Hypothyroidism, unspecified: Secondary | ICD-10-CM | POA: Diagnosis not present

## 2017-11-20 HISTORY — PX: CATARACT EXTRACTION W/PHACO: SHX586

## 2017-11-20 SURGERY — PHACOEMULSIFICATION, CATARACT, WITH IOL INSERTION
Anesthesia: Monitor Anesthesia Care | Site: Eye | Laterality: Right | Wound class: "Clean "

## 2017-11-20 MED ORDER — MOXIFLOXACIN HCL 0.5 % OP SOLN
OPHTHALMIC | Status: DC | PRN
  Administered 2017-11-20: 0.2 mL via OPHTHALMIC

## 2017-11-20 MED ORDER — EPINEPHRINE PF 1 MG/ML IJ SOLN
INTRAOCULAR | Status: DC | PRN
  Administered 2017-11-20: 83 mL via OPHTHALMIC

## 2017-11-20 MED ORDER — FENTANYL CITRATE (PF) 100 MCG/2ML IJ SOLN
INTRAMUSCULAR | Status: DC | PRN
  Administered 2017-11-20 (×2): 50 ug via INTRAVENOUS

## 2017-11-20 MED ORDER — MIDAZOLAM HCL 2 MG/2ML IJ SOLN
INTRAMUSCULAR | Status: DC | PRN
  Administered 2017-11-20 (×2): 1 mg via INTRAVENOUS

## 2017-11-20 MED ORDER — SODIUM HYALURONATE 23 MG/ML IO SOLN
INTRAOCULAR | Status: DC | PRN
  Administered 2017-11-20: 0.6 mL via INTRAOCULAR

## 2017-11-20 MED ORDER — SODIUM HYALURONATE 10 MG/ML IO SOLN
INTRAOCULAR | Status: DC | PRN
  Administered 2017-11-20: 0.55 mL via INTRAOCULAR

## 2017-11-20 MED ORDER — LACTATED RINGERS IV SOLN: 1000.0000 mL | INTRAVENOUS | Status: DC

## 2017-11-20 MED ORDER — LIDOCAINE HCL (PF) 2 % IJ SOLN
INTRAOCULAR | Status: DC | PRN
  Administered 2017-11-20: 1 mL via INTRAOCULAR

## 2017-11-20 MED ORDER — ARMC OPHTHALMIC DILATING DROPS
1.0000 "application " | OPHTHALMIC | Status: DC | PRN
  Administered 2017-11-20: 1 via OPHTHALMIC

## 2017-11-20 SURGICAL SUPPLY — 17 items
CANNULA ANT/CHMB 27G (MISCELLANEOUS) ×1 IMPLANT
CANNULA ANT/CHMB 27GA (MISCELLANEOUS) ×2 IMPLANT
DISSECTOR HYDRO NUCLEUS 50X22 (MISCELLANEOUS) ×2 IMPLANT
GLOVE BIO SURGEON STRL SZ8 (GLOVE) ×2 IMPLANT
GLOVE SURG LX 7.5 STRW (GLOVE) ×1
GLOVE SURG LX STRL 7.5 STRW (GLOVE) ×1 IMPLANT
GOWN STRL REUS W/ TWL LRG LVL3 (GOWN DISPOSABLE) ×2 IMPLANT
GOWN STRL REUS W/TWL LRG LVL3 (GOWN DISPOSABLE) ×2
LENS IOL TECNIS ITEC 22.5 (Intraocular Lens) ×1 IMPLANT
MARKER SKIN DUAL TIP RULER LAB (MISCELLANEOUS) ×2 IMPLANT
PACK DR. KING ARMS (PACKS) ×2 IMPLANT
PACK EYE AFTER SURG (MISCELLANEOUS) ×2 IMPLANT
PACK OPTHALMIC (MISCELLANEOUS) ×2 IMPLANT
SYR 3ML LL SCALE MARK (SYRINGE) ×2 IMPLANT
SYR TB 1ML LUER SLIP (SYRINGE) ×2 IMPLANT
WATER STERILE IRR 500ML POUR (IV SOLUTION) ×2 IMPLANT
WIPE NON LINTING 3.25X3.25 (MISCELLANEOUS) ×2 IMPLANT

## 2017-11-20 NOTE — Transfer of Care (Signed)
Immediate Anesthesia Transfer of Care Note  Patient: Theodore [name]  Procedure(s) Performed: CATARACT EXTRACTION PHACO AND INTRAOCULAR LENS PLACEMENT (IOC) RIGHT (Right Eye)  Patient Location: PACU  Anesthesia Type: MAC  Level of Consciousness: awake, alert  and patient cooperative  Airway and Oxygen Therapy: Patient Spontanous Breathing and Patient connected to supplemental oxygen  Post-op Assessment: Post-op Vital signs reviewed, Patient's Cardiovascular Status Stable, Respiratory Function Stable, Patent Airway and No signs of Nausea or vomiting  Post-op Vital Signs: Reviewed and stable  Complications: No apparent anesthesia complications

## 2017-11-20 NOTE — Anesthesia Procedure Notes (Signed)
Procedure Name: MAC Date/Time: 11/20/2017 11:06 AM Performed by: Lind Guest, CRNA Pre-anesthesia Checklist: Patient identified, Emergency Drugs available, Suction available, Patient being monitored and Timeout performed Patient Re-evaluated:Patient Re-evaluated prior to induction Oxygen Delivery Method: Nasal cannula

## 2017-11-20 NOTE — Op Note (Signed)
OPERATIVE NOTE  Theodore [name] [id number] 11/20/2017   PREOPERATIVE DIAGNOSIS:  Nuclear sclerotic cataract right eye.  H25.11   POSTOPERATIVE DIAGNOSIS:    Nuclear sclerotic cataract right eye.     PROCEDURE:  Phacoemusification with posterior chamber intraocular lens placement of the right eye   LENS:   Implant Name Type Inv. Item Serial No. Manufacturer Lot No. LRB No. Used  LENS IOL DIOP 22.5 - [id number] Intraocular Lens LENS IOL DIOP 22.5 [id number] AMO  Right 1       PCB00 +22.5   ULTRASOUND TIME: 0 minutes 50 seconds.  CDE 4.43   SURGEON:  Benay Pillow, MD, MPH  ANESTHESIOLOGIST: Anesthesiologist: Elgie Collard, MD CRNA: Lind Guest, CRNA   ANESTHESIA:  Topical with tetracaine drops augmented with 1% preservative-free intracameral lidocaine.  ESTIMATED BLOOD LOSS: less than 1 mL.   COMPLICATIONS:  None.   DESCRIPTION OF PROCEDURE:  The patient was identified in the holding room and transported to the operating room and placed in the supine position under the operating microscope.  The right eye was identified as the operative eye and it was prepped and draped in the usual sterile ophthalmic fashion.   A 1.0 millimeter clear-corneal paracentesis was made at the 10:30 position. 0.5 ml of preservative-free 1% lidocaine with epinephrine was injected into the anterior chamber.  The anterior chamber was filled with Healon 5 viscoelastic.  A 2.4 millimeter keratome was used to make a near-clear corneal incision at the 8:00 position.  A curvilinear capsulorrhexis was made with a cystotome and capsulorrhexis forceps.  Balanced salt solution was used to hydrodissect and hydrodelineate the nucleus.   Phacoemulsification was then used in stop and chop fashion to remove the lens nucleus and epinucleus.  The remaining cortex was then removed using the irrigation and aspiration handpiece. Healon was then placed into the capsular bag to distend it for lens placement.  A lens was  then injected into the capsular bag.  The remaining viscoelastic was aspirated.   Wounds were hydrated with balanced salt solution.  The anterior chamber was inflated to a physiologic pressure with balanced salt solution.   Intracameral vigamox 0.1 mL undiluted was injected into the eye and a drop placed onto the ocular surface.  No wound leaks were noted.  The patient was taken to the recovery room in stable condition without complications of anesthesia or surgery  Benay Pillow 11/20/2017, 11:20 AM

## 2017-11-20 NOTE — Anesthesia Postprocedure Evaluation (Signed)
Anesthesia Post Note  Patient: Theodore [name]  Procedure(s) Performed: CATARACT EXTRACTION PHACO AND INTRAOCULAR LENS PLACEMENT (IOC) RIGHT (Right Eye)  Patient location during evaluation: PACU Anesthesia Type: MAC Level of consciousness: awake and alert Pain management: pain level controlled Vital Signs Assessment: post-procedure vital signs reviewed and stable Respiratory status: spontaneous breathing Cardiovascular status: blood pressure returned to baseline Anesthetic complications: no    Jaci Standard, III,  Sandrine Bloodsworth D

## 2017-11-20 NOTE — H&P (Signed)
The History and Physical notes are on paper, have been signed, and are to be scanned.   I have examined the patient and there are no changes to the H&P.   Theodore [name] 11/20/2017 10:39 AM

## 2017-11-20 NOTE — Anesthesia Preprocedure Evaluation (Signed)
Anesthesia Evaluation  Patient identified by MRN, date of birth, ID band Patient awake    Reviewed: Allergy & Precautions, H&P , NPO status , Patient's Chart, lab work & pertinent test results  History of Anesthesia Complications Negative for: history of anesthetic complications  Airway Mallampati: II  TM Distance: >3 FB Neck ROM: full    Dental no notable dental hx.    Pulmonary COPD,    Pulmonary exam normal breath sounds clear to auscultation       Cardiovascular negative cardio ROS Normal cardiovascular exam     Neuro/Psych    GI/Hepatic negative GI ROS, Neg liver ROS,   Endo/Other  negative endocrine ROS  Renal/GU negative Renal ROS     Musculoskeletal   Abdominal   Peds  Hematology negative hematology ROS (+)   Anesthesia Other Findings   Reproductive/Obstetrics                             Anesthesia Physical Anesthesia Plan  ASA: II  Anesthesia Plan: MAC   Post-op Pain Management:    Induction:   PONV Risk Score and Plan:   Airway Management Planned:   Additional Equipment:   Intra-op Plan:   Post-operative Plan:   Informed Consent: I have reviewed the patients History and Physical, chart, labs and discussed the procedure including the risks, benefits and alternatives for the proposed anesthesia with the patient or authorized representative who has indicated his/her understanding and acceptance.     Plan Discussed with:   Anesthesia Plan Comments:         Anesthesia Quick Evaluation

## 2017-11-21 ENCOUNTER — Encounter: Payer: Self-pay | Admitting: Ophthalmology

## 2017-12-17 ENCOUNTER — Ambulatory Visit: Payer: PPO | Admitting: Psychology

## 2017-12-17 DIAGNOSIS — F339 Major depressive disorder, recurrent, unspecified: Secondary | ICD-10-CM | POA: Diagnosis not present

## 2017-12-27 ENCOUNTER — Other Ambulatory Visit: Payer: Self-pay | Admitting: Internal Medicine

## 2017-12-28 NOTE — Telephone Encounter (Signed)
Refilled: 10/23/2017 Last OV: 06/15/2017 Next OV: not scheduled

## 2018-01-14 ENCOUNTER — Ambulatory Visit: Payer: PPO | Admitting: Psychology

## 2018-01-14 DIAGNOSIS — F339 Major depressive disorder, recurrent, unspecified: Secondary | ICD-10-CM | POA: Diagnosis not present

## 2018-01-18 ENCOUNTER — Other Ambulatory Visit: Payer: Self-pay | Admitting: Internal Medicine

## 2018-01-29 ENCOUNTER — Encounter: Payer: Self-pay | Admitting: Internal Medicine

## 2018-01-29 ENCOUNTER — Ambulatory Visit: Payer: Self-pay | Admitting: Internal Medicine

## 2018-01-29 ENCOUNTER — Ambulatory Visit: Payer: PPO | Admitting: Internal Medicine

## 2018-01-29 VITALS — BP 120/70 | HR 60 | Ht 75.0 in | Wt 183.4 lb

## 2018-01-29 DIAGNOSIS — J449 Chronic obstructive pulmonary disease, unspecified: Secondary | ICD-10-CM | POA: Diagnosis not present

## 2018-01-29 NOTE — Patient Instructions (Signed)
Follow up with Cardiology  Follow up with Vascular surgery

## 2018-01-29 NOTE — Progress Notes (Signed)
Name: Theodore [name] MRN: [id number] DOB: October 07, 1929     CONSULTATION DATE:  01/29/2018   REFERRING MD : Derrel Nip  CHIEF COMPLAINT:  Follow up COPD  HISTORY OF PRESENT ILLNESS:  SOB is intermittent No signs of infection No signs of CHF   Patient uses his own NEB therapy as needed He takes electrodes and uses distilled water and mixes in [facility] and uses this in his own nebulizer He calls this concoction Colloidal Silver Nebulizers  He uses this NEB therapy as   Needed and helps a lot with chest congestion and cough This seems to be a from of Saline NEBS    Has PAD follows Vasc surgery He takes OTC EDTA -patient feels that this has helped his vascualr issues      Review of Systems:  Gen:  Denies  fever, sweats, chills weigh loss  HEENT: Denies blurred vision, double vision, ear pain, eye pain, hearing loss, nose bleeds, sore throat Cardiac:  No dizziness, chest pain or heaviness, chest tightness,edema, No JVD Resp:   No cough, -sputum production, -shortness of breath,-wheezing, -hemoptysis,  Gi: Denies swallowing difficulty, stomach pain, nausea or vomiting, diarrhea, constipation, bowel incontinence Gu:  Denies bladder incontinence, burning urine Ext:   Denies Joint pain, stiffness or swelling Skin: Denies  skin rash, easy bruising or bleeding or hives Endoc:  Denies polyuria, polydipsia , polyphagia or weight change Psych:   Denies depression, insomnia or hallucinations  Other:  All other systems negative     BP 120/70 (BP Location: Left Arm, Cuff Size: Normal)   Pulse 60   Ht 6\' 3"  (1.905 m)   Wt 183 lb 6.4 oz (83.2 kg)   SpO2 100%   BMI 22.92 kg/m   Physical Examination:   GENERAL:NAD, no fevers, chills, no weakness no fatigue HEAD: Normocephalic, atraumatic.  EYES: Pupils equal, round, reactive to light. Extraocular muscles intact. No scleral icterus.  MOUTH: Moist mucosal membrane. Dentition intact. No abscess noted.  EAR, NOSE, THROAT: Clear  without exudates. No external lesions.  NECK: Supple. No thyromegaly. No nodules. No JVD.  PULMONARY: CTA B/L no wheezing, rhonchi, crackles CARDIOVASCULAR: S1 and S2. Regular rate and rhythm. No murmurs, rubs, or gallops. No edema. Pedal pulses 2+ bilaterally.  GASTROINTESTINAL: Soft, nontender, nondistended. No masses. Positive bowel sounds. No hepatosplenomegaly.  MUSCULOSKELETAL: No swelling, clubbing, or edema. Range of motion full in all extremities.  NEUROLOGIC: Cranial nerves II through XII are intact. No gross focal neurological deficits. Sensation intact. Reflexes intact.  SKIN: No ulceration, lesions, rashes, or cyanosis. Skin warm and dry. Turgor intact.  PSYCHIATRIC: Mood, affect within normal limits. The patient is awake, alert and oriented x 3. Insight, judgment intact.  ALL OTHER ROS ARE NEGATIVE        ASSESSMENT / PLAN: 84 yo pleasant white male with chronic intermittent SOB/DOE with mild COPD GOLD STAGE A with allergic rhinitis and GERD with Peripheral Artery disease  SOB and DOE intermittent  Resolves with home made saline nebs(sea salt and distilled water produced by generator)  COPD MILD No precribed inhalers at this time No signs of infection No need for steroids  Allergic rhinitis Saline Nasal sprays as needed  PAD  Uses OTC EDTA Feels better Follow up vascular surgery  Follow up with Cardiology    Patient  satisfied with Plan of action and management. All questions answered Follow up in 6 months  Jenifer Struve Patricia Pesa, M.D.  [facility] Pulmonary & Critical Care Medicine  Medical Director [facility]  [facility] Cardio-Pulmonary Department

## 2018-02-11 ENCOUNTER — Ambulatory Visit: Payer: PPO | Admitting: Psychology

## 2018-02-11 DIAGNOSIS — F339 Major depressive disorder, recurrent, unspecified: Secondary | ICD-10-CM | POA: Diagnosis not present

## 2018-03-07 ENCOUNTER — Telehealth: Payer: Self-pay | Admitting: Internal Medicine

## 2018-03-07 MED ORDER — LEVOTHYROXINE SODIUM 100 MCG PO TABS: ORAL_TABLET | ORAL | 1 refills | Status: DC

## 2018-03-07 NOTE — Telephone Encounter (Signed)
PT is requesting to have his levothyroxine (SYNTHROID, LEVOTHROID) 100 MCG tablet refilled. He has 2 more weeks remaining on his current medication. Pt is requesting to have this filled through his mail pharmacy.

## 2018-03-07 NOTE — Telephone Encounter (Signed)
Medication has been refilled and pt is aware.  

## 2018-03-18 ENCOUNTER — Ambulatory Visit: Payer: PPO | Admitting: Psychology

## 2018-03-18 DIAGNOSIS — F339 Major depressive disorder, recurrent, unspecified: Secondary | ICD-10-CM | POA: Diagnosis not present

## 2018-03-19 ENCOUNTER — Ambulatory Visit: Payer: Self-pay | Admitting: Cardiovascular Disease

## 2018-03-20 NOTE — Progress Notes (Signed)
Cardiology Office Note  Date:  03/21/2018   ID:  Theodore [name], DOB [birth year], MRN [id number]  PCP:  [name], MD   Chief Complaint  Patient presents with  . other    12 mo follow up. Medications reviewed verbally.     HPI:  85 yo gentleman who lives at [facility] [place] with notes indicating a history of  asymptomatic bradycardia,  prior history of depression,  who presented to the hospital with episodes of shortness of breath, lightheadedness, fatigue 05/10/2016 COPD  Who presents for follow-up of his sinus bradycardia and shortness of breath  In follow-up reports he is doing relatively well although complains of" Leg weakness, He is active, does yoga, 1x a week bike 1x a week Office on second floor, does stairs  "12 times a day"  Does not want cholesterol pill, statin CT scan with aortic atherosclerosis can coronary calcification Reviewed recent ABI studies with him showing moderate bilateral disease  Had flu shot, did not want to  Asymptomatic PVCs PVCs seen on EKG today  followed by Dr. Mortimer Fries Cardiopulmonary stress testing was ordered 2019  gas exchange demonstrates excellent functional capacity no evidence of cardiopulmonary limitations.  At peak exercise, patient approached ventilatory limits and is likely hyperventilating and shown with elevated VE/VCO2 slope.    Prior history of shortness of breath episodes Previous use of inhalers did not help Does not seem to be as big an issue recently  Normal stress test 05/16/2016 Event monitor  Uses colloidal silver with nebulizer nap in the afternoon  Prior history of depression started October 2017,  significant weight loss since that time, anorexia  EKG personally reviewed by myself on todays visit Shows normal sinus rhythm rate 69 bpm no significant ST or T wave changes Frequent PVCs, discussed with him, he is asymptomatic and does not want medications or further work-up  Other past medical history reviewed CT  scan done in the hospital showing no significant PE contribute to shortness of breath  shows three-vessel coronary calcifications at least moderate in severity, aortic arch calcification mild to moderate  Echo 05/09/2016 - Left ventricle: The cavity size was normal. Wall thickness was  increased increased in a pattern of mild to moderate LVH. There  was mild concentric hypertrophy. Systolic function was normal.   The estimated ejection fraction was in the range of 60% to 65%.  Wall motion was normal; there were no regional wall motion abnormalities. Left ventricular diastolic function parameterswere normal. - Pulmonary arteries: Systolic pressure was within the normalrange.  Stress myoview 05/16/2016  Low risk study without evidence of ischemia or scar.  The left ventricular ejection fraction is hyperdynamic (>65%).  Sensitivity and specificity of the study are degraded by extracardiac activity.     PMH:   has a past medical history of Arthritis, Cancer ([place]) (2002), Colon polyp, COPD (chronic obstructive pulmonary disease) ([place]), Depression, Dysrhythmia, History of kidney stones, Hypothyroidism, Rhinitis, Thyroid disease, Tremors of nervous system, Wears dentures, and Wears hearing aid in both ears.  PSH:    Past Surgical History:  Procedure Laterality Date  . ADENOIDECTOMY    . CATARACT EXTRACTION W/PHACO Left 10/22/2017   Procedure: CATARACT EXTRACTION PHACO AND INTRAOCULAR LENS PLACEMENT ([place])  LEFT;  Surgeon: Eulogio Bear, MD;  Location: [facility];  Service: Ophthalmology;  Laterality: Left;  . CATARACT EXTRACTION W/PHACO Right 11/20/2017   Procedure: CATARACT EXTRACTION PHACO AND INTRAOCULAR LENS PLACEMENT ([place]) RIGHT;  Surgeon: Eulogio Bear, MD;  Location: [facility];  Service: Ophthalmology;  Laterality: Right;  . ESOPHAGOGASTRODUODENOSCOPY (EGD) WITH PROPOFOL N/A 10/31/2016   Procedure: ESOPHAGOGASTRODUODENOSCOPY (EGD) WITH PROPOFOL;  Surgeon: Lollie Sails, MD;  Location: [facility] ENDOSCOPY;  Service: Endoscopy;  Laterality: N/A;  . JOINT REPLACEMENT Right 2003   knee  . knee meniscus repair Right   . MELANOMA EXCISION Right    ear. Followed by Dr. Nehemiah Massed  . PATELLECTOMY Bilateral   . TONSILLECTOMY      Current Outpatient Medications  Medication Sig Dispense Refill  . Ascorbic Acid (VITAMIN C) 1000 MG tablet Take 1,000 mg by mouth daily.    [name] b complex vitamins tablet Take 4 tablets by mouth daily.    . Bilberry, Vaccinium myrtillus, (BILBERRY PO) Take by mouth.    . cholecalciferol (VITAMIN D) 1000 UNITS tablet Take 4,000 Units by mouth daily.     [name] co-enzyme Q-10 30 MG capsule Take 30 mg by mouth daily.     . COD LIVER OIL PO Take by mouth.    . cyanocobalamin 500 MCG tablet Take 500 mcg by mouth daily.    . Flaxseed, Linseed, (FLAX SEEDS PO) Take by mouth daily. With omega 3    . IODINE, KELP, PO Take by mouth.    . levothyroxine (SYNTHROID, LEVOTHROID) 100 MCG tablet Take 1 tablet by mouth once daily before breakfast 90 tablet 1  . LUTEIN PO Take by mouth.    . magnesium oxide (MAG-OX) 400 MG tablet Take 400 mg by mouth daily.    . mometasone (ELOCON) 0.1 % cream Apply 1 application topically daily.    [name] NEEDLE, DISP, 25 G (EASY TOUCH FLIPLOCK NEEDLES) 25G X 1" MISC Inject 1 application into the muscle once a week. Use for b12 injections 12 each 0  . OVER THE COUNTER MEDICATION Take 1,000 mg by mouth daily. Concentrated raw butter oil    . OVER THE COUNTER MEDICATION Take 4 tablets by mouth daily. Standard Process- Cardio Plus     . OVER THE COUNTER MEDICATION Take 1 tablet by mouth daily. Cardio Platinum - Vitamin K-2 500 mg, Arginine 300 mg, Natokinase 100 mg    . OVER THE COUNTER MEDICATION Take by mouth daily. Hemp oil    . OVER THE COUNTER MEDICATION Take 133 mg by mouth 2 (two) times daily. Fenukgreek    . Phosphatidylserine 100 MG CAPS Take by mouth daily.    . Probiotic Product (PROBIOTIC ACIDOPHILUS) CAPS Take 1  capsule by mouth daily.    . RESTASIS 0.05 % ophthalmic emulsion Place 1 drop into both eyes 2 (two) times daily.    . S-Adenosylmethionine (SAM-E PO) Take by mouth.    . SYRINGE-NEEDLE, DISP, 3 ML (LUER LOCK SAFETY SYRINGES) 25G X 1" 3 ML MISC Inject 1 application into the muscle once a week. Use to for b12 injections 12 each 0  . Syringe/Needle, Disp, (SYRINGE 3CC/25GX1") 25G X 1" 3 ML MISC Use for b12 injections 50 each 0  . testosterone cypionate (DEPOTESTOSTERONE CYPIONATE) 200 MG/ML injection INJECT 0.50 MLS INTO THE MUSCLE EVERY 14 DAYS 1 mL 2  . TURMERIC PO Take by mouth.    . vitamin E (VITAMIN E) 400 UNIT capsule Take 400 Units by mouth daily.    [name] ezetimibe (ZETIA) 10 MG tablet Take 1 tablet (10 mg total) by mouth daily. 90 tablet 3   No current facility-administered medications for this visit.      Allergies:   Penicillins   Social History:  The patient  reports that he has never smoked. He has never used smokeless tobacco. He reports current alcohol use. He reports that he does not use drugs.   Family History:   family history includes Cancer in his sister; Heart disease in his father and mother; Hypertension in his mother.    Review of Systems: Review of Systems  Constitutional: Negative.        Leg weakness  Respiratory: Negative.   Cardiovascular: Negative.   Gastrointestinal: Negative.   Musculoskeletal: Negative.   Neurological: Negative.   Psychiatric/Behavioral: Negative.   All other systems reviewed and are negative.   PHYSICAL EXAM: Vitals:   03/21/18 1319  BP: 140/72  Pulse: 69   Body mass index is 22.88 kg/m.  Vitals:   03/21/18 1319  Weight: 186 lb (84.4 kg)  Height: 6' 3.6" (1.92 m)   Constitutional:  oriented to person, place, and time. No distress.  HENT:  Head: Grossly normal Eyes:  no discharge. No scleral icterus.  Neck: No JVD, no carotid bruits  Cardiovascular: Regular rate and rhythm, no murmurs appreciated Pulmonary/Chest: Clear  to auscultation bilaterally, no wheezes or rails Abdominal: Soft.  no distension.  no tenderness.  Musculoskeletal: Normal range of motion Neurological:  normal muscle tone. Coordination normal. No atrophy Skin: Skin warm and dry Psychiatric: normal affect, pleasant    Recent Labs: 03/22/2017: ALT 12; BUN 24; Creatinine, Ser 1.22; Hemoglobin 16.6; Platelets 252.0; Potassium 4.7; Sodium 139 08/24/2017: TSH 1.48    Lipid Panel Lab Results  Component Value Date   CHOL 218 (H) 03/22/2017   HDL 55.10 03/22/2017   LDLCALC 144 (H) 03/22/2017   TRIG 92.0 03/22/2017      Wt Readings from Last 3 Encounters:  03/21/18 186 lb (84.4 kg)  01/29/18 183 lb 6.4 oz (83.2 kg)  11/20/17 182 lb (82.6 kg)       ASSESSMENT AND PLAN:  Bradycardia - Plan: EKG 12-Lead Asymptomatic bradycardia, PVCs noted today, rate 60-70  Depression with anxiety Will defer to primary care,  Discussed how well he is doing today with his exercise  Aortic atherosclerosis ([place]) He is willing to try Zetia 10 mg daily.  Prescription sent in Does not want a statin  COPD Managed by Dr. Mortimer Fries, on inhalers Reports that he uses silver colloid with nebulizer  Chronic leg weakness and chronic shortness of breath  Coronary artery disease involving native coronary artery of native heart with angina pectoris ([place]) Currently with no symptoms of angina. No further workup at this time. Continue current medication regimen. Previous stress test reviewed with him   Fatigue, shortness of breath Negative stress test, normal echocardiogram Encouraged him to continue his regular exercise program  Depression  depression since October 2017 Continue exercise, overall doing well  CAD As above, three-vessel coronary disease on CT scan, Stress test With no ischemia Started on Zetia, low-dose aspirin  PAD Mild to moderate aortic atherosclerosis seen He does not want a statin    Disposition:   F/U  12 months     Total encounter time more than 25 minutes  Greater than 50% was spent in counseling and coordination of care with the patient    Orders Placed This Encounter  Procedures  . EKG 12-Lead     Signed, Esmond Plants, M.D., Ph.D. 03/21/2018  [facility] [place], [place] [phone]

## 2018-03-21 ENCOUNTER — Encounter: Payer: Self-pay | Admitting: Cardiovascular Disease

## 2018-03-21 ENCOUNTER — Ambulatory Visit: Payer: PPO | Admitting: Cardiovascular Disease

## 2018-03-21 VITALS — BP 140/72 | HR 69 | Ht 75.6 in | Wt 186.0 lb

## 2018-03-21 DIAGNOSIS — F418 Other specified anxiety disorders: Secondary | ICD-10-CM

## 2018-03-21 DIAGNOSIS — E785 Hyperlipidemia, unspecified: Secondary | ICD-10-CM

## 2018-03-21 DIAGNOSIS — I700 Atherosclerosis of aorta: Secondary | ICD-10-CM

## 2018-03-21 DIAGNOSIS — R001 Bradycardia, unspecified: Secondary | ICD-10-CM

## 2018-03-21 DIAGNOSIS — I25119 Atherosclerotic heart disease of native coronary artery with unspecified angina pectoris: Secondary | ICD-10-CM | POA: Diagnosis not present

## 2018-03-21 DIAGNOSIS — I493 Ventricular premature depolarization: Secondary | ICD-10-CM

## 2018-03-21 DIAGNOSIS — R0609 Other forms of dyspnea: Secondary | ICD-10-CM | POA: Diagnosis not present

## 2018-03-21 DIAGNOSIS — R0600 Dyspnea, unspecified: Secondary | ICD-10-CM | POA: Insufficient documentation

## 2018-03-21 MED ORDER — EZETIMIBE 10 MG PO TABS: 10.0000 mg | ORAL_TABLET | Freq: Every day | ORAL | 3 refills | Status: DC

## 2018-03-21 NOTE — Patient Instructions (Signed)
Medication Instructions:   Asa 81 mg daily Zetia one a day for cholesterol   If you need a refill on your cardiac medications before your next appointment, please call your pharmacy.    Lab work: No new labs needed   If you have labs (blood work) drawn today and your tests are completely normal, you will receive your results only by: [name] MyChart Message (if you have MyChart) OR . A paper copy in the mail If you have any lab test that is abnormal or we need to change your treatment, we will call you to review the results.   Testing/Procedures: No new testing needed   Follow-Up: At [facility], you and your health needs are our priority.  As part of our continuing mission to provide you with exceptional heart care, we have created designated Provider Care Teams.  These Care Teams include your primary Cardiologist (physician) and Advanced Practice Providers (APPs -  Physician Assistants and Nurse Practitioners) who all work together to provide you with the care you need, when you need it.  . You will need a follow up appointment in 12 months .   Please call our office 2 months in advance to schedule this appointment.    . Providers on your designated Care Team:   . Murray Hodgkins, NP . Christell Faith, PA-C . Marrianne Mood, PA-C  Any Other Special Instructions Will Be Listed Below (If Applicable).  For educational health videos Log in to : [email or web address] Or : [email or web address], password : [email or web address]

## 2018-03-29 DIAGNOSIS — L820 Inflamed seborrheic keratosis: Secondary | ICD-10-CM | POA: Diagnosis not present

## 2018-03-29 DIAGNOSIS — D485 Neoplasm of uncertain behavior of skin: Secondary | ICD-10-CM | POA: Diagnosis not present

## 2018-03-29 DIAGNOSIS — Z1283 Encounter for screening for malignant neoplasm of skin: Secondary | ICD-10-CM | POA: Diagnosis not present

## 2018-03-29 DIAGNOSIS — D225 Melanocytic nevi of trunk: Secondary | ICD-10-CM | POA: Diagnosis not present

## 2018-03-29 DIAGNOSIS — D1800 Hemangioma unspecified site: Secondary | ICD-10-CM | POA: Diagnosis not present

## 2018-03-29 DIAGNOSIS — L821 Other seborrheic keratosis: Secondary | ICD-10-CM | POA: Diagnosis not present

## 2018-03-29 DIAGNOSIS — Z85820 Personal history of malignant melanoma of skin: Secondary | ICD-10-CM | POA: Diagnosis not present

## 2018-03-29 DIAGNOSIS — D229 Melanocytic nevi, unspecified: Secondary | ICD-10-CM | POA: Diagnosis not present

## 2018-04-01 ENCOUNTER — Ambulatory Visit (INDEPENDENT_AMBULATORY_CARE_PROVIDER_SITE_OTHER): Payer: PPO | Admitting: Vascular Surgery

## 2018-04-01 ENCOUNTER — Encounter (INDEPENDENT_AMBULATORY_CARE_PROVIDER_SITE_OTHER): Payer: PPO

## 2018-04-04 ENCOUNTER — Ambulatory Visit (INDEPENDENT_AMBULATORY_CARE_PROVIDER_SITE_OTHER): Payer: PPO

## 2018-04-04 ENCOUNTER — Ambulatory Visit (INDEPENDENT_AMBULATORY_CARE_PROVIDER_SITE_OTHER): Payer: PPO | Admitting: Vascular Surgery

## 2018-04-04 ENCOUNTER — Other Ambulatory Visit: Payer: Self-pay | Admitting: Internal Medicine

## 2018-04-04 ENCOUNTER — Encounter (INDEPENDENT_AMBULATORY_CARE_PROVIDER_SITE_OTHER): Payer: Self-pay | Admitting: Vascular Surgery

## 2018-04-04 VITALS — BP 150/75 | HR 52 | Resp 12 | Ht 75.0 in | Wt 181.8 lb

## 2018-04-04 DIAGNOSIS — E785 Hyperlipidemia, unspecified: Secondary | ICD-10-CM

## 2018-04-04 DIAGNOSIS — I25119 Atherosclerotic heart disease of native coronary artery with unspecified angina pectoris: Secondary | ICD-10-CM | POA: Diagnosis not present

## 2018-04-04 DIAGNOSIS — I739 Peripheral vascular disease, unspecified: Secondary | ICD-10-CM

## 2018-04-04 DIAGNOSIS — R29898 Other symptoms and signs involving the musculoskeletal system: Secondary | ICD-10-CM | POA: Diagnosis not present

## 2018-04-04 NOTE — Progress Notes (Signed)
MRN : [id number]  Theodore [name] is a 85 y.o. (March 11, 1930) male who presents with chief complaint of  Chief Complaint  Patient presents with  . Follow-up  .  History of Present Illness:   The patient returns to the office for followup and review of the noninvasive studies. There have been no interval changes in lower extremity symptoms. He continues to describe a weakness and "shuffling of my feet".  No interval development of rest pain symptoms. No new ulcers or wounds have occurred since the last visit.  There have been no significant changes to the patient's overall health care.  The patient denies amaurosis fugax or recent TIA symptoms. There are no recent neurological changes noted. The patient denies history of DVT, PE or superficial thrombophlebitis. The patient denies recent episodes of angina or shortness of breath.   ABI Rt=0.68 and Lt=0.79   Duplex ultrasound of the  Bilateral lower extremities arterial system shows normal femoral popliteal arteries with mixed tibial disease  Current Meds  Medication Sig  . Ascorbic Acid (VITAMIN C) 1000 MG tablet Take 1,000 mg by mouth daily.  [name] b complex vitamins tablet Take 4 tablets by mouth daily.  . Bilberry, Vaccinium myrtillus, (BILBERRY PO) Take by mouth.  . cholecalciferol (VITAMIN D) 1000 UNITS tablet Take 4,000 Units by mouth daily.   [name] co-enzyme Q-10 30 MG capsule Take 30 mg by mouth daily.   . COD LIVER OIL PO Take by mouth.  . cyanocobalamin 500 MCG tablet Take 500 mcg by mouth daily.  [name] ezetimibe (ZETIA) 10 MG tablet Take 1 tablet (10 mg total) by mouth daily.  . Flaxseed, Linseed, (FLAX SEEDS PO) Take by mouth daily. With omega 3  . IODINE, KELP, PO Take by mouth.  . levothyroxine (SYNTHROID, LEVOTHROID) 100 MCG tablet Take 1 tablet by mouth once daily before breakfast  . LUTEIN PO Take by mouth.  . magnesium oxide (MAG-OX) 400 MG tablet Take 400 mg by mouth daily.  . mometasone (ELOCON) 0.1 % cream Apply 1  application topically daily.  [name] NEEDLE, DISP, 25 G (EASY TOUCH FLIPLOCK NEEDLES) 25G X 1" MISC Inject 1 application into the muscle once a week. Use for b12 injections  . OVER THE COUNTER MEDICATION Take 1,000 mg by mouth daily. Concentrated raw butter oil  . OVER THE COUNTER MEDICATION Take 4 tablets by mouth daily. Standard Process- Cardio Plus   . OVER THE COUNTER MEDICATION Take 1 tablet by mouth daily. Cardio Platinum - Vitamin K-2 500 mg, Arginine 300 mg, Natokinase 100 mg  . OVER THE COUNTER MEDICATION Take by mouth daily. Hemp oil  . OVER THE COUNTER MEDICATION Take 133 mg by mouth 2 (two) times daily. Fenukgreek  . Phosphatidylserine 100 MG CAPS Take by mouth daily.  . Probiotic Product (PROBIOTIC ACIDOPHILUS) CAPS Take 1 capsule by mouth daily.  . RESTASIS 0.05 % ophthalmic emulsion Place 1 drop into both eyes 2 (two) times daily.  . S-Adenosylmethionine (SAM-E PO) Take by mouth.  . SYRINGE-NEEDLE, DISP, 3 ML (LUER LOCK SAFETY SYRINGES) 25G X 1" 3 ML MISC Inject 1 application into the muscle once a week. Use to for b12 injections  . Syringe/Needle, Disp, (SYRINGE 3CC/25GX1") 25G X 1" 3 ML MISC Use for b12 injections  . testosterone cypionate (DEPOTESTOSTERONE CYPIONATE) 200 MG/ML injection INJECT 0.50 MLS INTO THE MUSCLE EVERY 14 DAYS  . TURMERIC PO Take by mouth.  . vitamin E (VITAMIN E) 400 UNIT capsule Take 400 Units by mouth  daily.    Past Medical History:  Diagnosis Date  . Arthritis    knees  . Cancer [facility]) 2002   melanoma right ear  . Colon polyp   . COPD (chronic obstructive pulmonary disease) ([place])    pt said he believes it is a misdiagnosis   . Depression   . Dysrhythmia    PVCs  . History of kidney stones   . Hypothyroidism   . Rhinitis   . Thyroid disease    hypothyroid  . Tremors of nervous system    Benign  . Wears dentures    partial upper and lower  . Wears hearing aid in both ears     Past Surgical History:  Procedure Laterality Date  .  ADENOIDECTOMY    . CATARACT EXTRACTION W/PHACO Left 10/22/2017   Procedure: CATARACT EXTRACTION PHACO AND INTRAOCULAR LENS PLACEMENT ([place])  LEFT;  Surgeon: Eulogio Bear, MD;  Location: [place];  Service: Ophthalmology;  Laterality: Left;  . CATARACT EXTRACTION W/PHACO Right 11/20/2017   Procedure: CATARACT EXTRACTION PHACO AND INTRAOCULAR LENS PLACEMENT ([place]) RIGHT;  Surgeon: Eulogio Bear, MD;  Location: [place];  Service: Ophthalmology;  Laterality: Right;  . ESOPHAGOGASTRODUODENOSCOPY (EGD) WITH PROPOFOL N/A 10/31/2016   Procedure: ESOPHAGOGASTRODUODENOSCOPY (EGD) WITH PROPOFOL;  Surgeon: Lollie Sails, MD;  Location: [facility] ENDOSCOPY;  Service: Endoscopy;  Laterality: N/A;  . JOINT REPLACEMENT Right 2003   knee  . knee meniscus repair Right   . MELANOMA EXCISION Right    ear. Followed by Dr. Nehemiah Massed  . PATELLECTOMY Bilateral   . TONSILLECTOMY      Social History Social History   Tobacco Use  . Smoking status: Never Smoker  . Smokeless tobacco: Never Used  Substance Use Topics  . Alcohol use: Yes    Comment: 1 glass of wine daily  . Drug use: No    Family History Family History  Problem Relation Age of Onset  . Hypertension Mother   . Heart disease Mother        CHF  . Heart disease Father   . Cancer Sister        melanoma    Allergies  Allergen Reactions  . Penicillins Rash     REVIEW OF SYSTEMS (Negative unless checked)  Constitutional: [] Weight loss  [] Fever  [] Chills Cardiac: [] Chest pain   [] Chest pressure   [] Palpitations   [] Shortness of breath when laying flat   [] Shortness of breath with exertion. Vascular:  [] Pain in legs with walking   [x] Pain in legs at rest  [] History of DVT   [] Phlebitis   [] Swelling in legs   [] Varicose veins   [] Non-healing ulcers Pulmonary:   [] Uses home oxygen   [] Productive cough   [] Hemoptysis   [] Wheeze  [] COPD   [] Asthma Neurologic:  [] Dizziness   [] Seizures   [] History of stroke    [] History of TIA  [] Aphasia   [] Vissual changes   [] Weakness or numbness in arm   [] Weakness or numbness in leg Musculoskeletal:   [] Joint swelling   [] Joint pain   [] Low back pain Hematologic:  [] Easy bruising  [] Easy bleeding   [] Hypercoagulable state   [] Anemic Gastrointestinal:  [] Diarrhea   [] Vomiting  [] Gastroesophageal reflux/heartburn   [] Difficulty swallowing. Genitourinary:  [] Chronic kidney disease   [] Difficult urination  [] Frequent urination   [] Blood in urine Skin:  [] Rashes   [] Ulcers  Psychological:  [] History of anxiety   []  History of major depression.  Physical Examination  Vitals:  04/04/18 1208  BP: (!) 150/75  Pulse: (!) 52  Resp: 12  Weight: 181 lb 12.8 oz (82.5 kg)  Height: 6\' 3"  (1.905 m)   Body mass index is 22.72 kg/m. Gen: WD/WN, NAD Head: [place]/AT, No temporalis wasting.  Ear/Nose/Throat: Hearing grossly intact, nares w/o erythema or drainage Eyes: PER, EOMI, sclera nonicteric.  Neck: Supple, no large masses.   Pulmonary:  Good air movement, no audible wheezing bilaterally, no use of accessory muscles.  Cardiac: RRR, no JVD Vascular: Vessel Right Left  Radial Palpable Palpable  PT Not Palpable Not Palpable  DP Not Palpable Not Palpable  Gastrointestinal: Non-distended. No guarding/no peritoneal signs.  Musculoskeletal: M/S 5/5 throughout.  No deformity or atrophy.  Neurologic: CN 2-12 intact. Symmetrical.  Speech is fluent. Motor exam as listed above. Psychiatric: Judgment intact, Mood & affect appropriate for pt's clinical situation. Dermatologic: No rashes or ulcers noted.  No changes consistent with cellulitis. Lymph : No lichenification or skin changes of chronic lymphedema.  CBC Lab Results  Component Value Date   WBC 5.0 03/22/2017   HGB 16.6 03/22/2017   HCT 49.3 03/22/2017   MCV 96.5 03/22/2017   PLT 252.0 03/22/2017    BMET    Component Value Date/Time   NA 139 03/22/2017 0827   K 4.7 03/22/2017 0827   CL 102 03/22/2017 0827    CO2 31 03/22/2017 0827   GLUCOSE 100 (H) 03/22/2017 0827   BUN 24 (H) 03/22/2017 0827   CREATININE 1.22 03/22/2017 0827   CREATININE 0.92 02/03/2015 1504   CALCIUM 9.3 03/22/2017 0827   GFRNONAA >60 05/10/2016 0236   GFRNONAA 77 02/03/2015 1504   GFRAA >60 05/10/2016 0236   GFRAA 89 02/03/2015 1504   CrCl cannot be calculated (Patient's most recent lab result is older than the maximum 21 days allowed.).  COAG No results found for: INR, PROTIME  Radiology No results found.   Assessment/Plan 1. PAD (peripheral artery disease) (HCC)  Recommend:  The patient has evidence of atherosclerosis of the lower extremities with claudication.  The patient does not voice lifestyle limiting changes at this point in time.  Noninvasive studies do not suggest clinically significant change.  No invasive studies, angiography or surgery at this time The patient should continue walking and begin a more formal exercise program.  The patient should continue antiplatelet therapy and aggressive treatment of the lipid abnormalities  No changes in the patient's medications at this time  The patient should continue wearing graduated compression socks 10-15 mmHg strength to control the mild edema.   - VAS [place] ABI WITH/WO TBI; Future  2. Weakness of both lower extremities He has not been recently evaluated by Neurology.  He describes weakness and numbness as well as shuffling which are not typical for PAD.  In fact at this point he states he is going up stairs at his home 10+ time a day and is riding a bike 3 miles once a weak, again not necessarily consistent with PAD  Given these factors Neurology evaluation may be helpful  3. Coronary artery disease involving native coronary artery of native heart with angina pectoris ([place]) Continue cardiac and antihypertensive medications as already ordered and reviewed, no changes at this time.  Continue statin as ordered and reviewed, no changes at this  time  Nitrates PRN for chest pain   4. Hyperlipidemia LDL goal <100 Continue statin as ordered and reviewed, no changes at this time     Hortencia Pilar, MD  04/04/2018 12:10 PM

## 2018-04-05 ENCOUNTER — Telehealth: Payer: Self-pay | Admitting: Internal Medicine

## 2018-04-05 DIAGNOSIS — E039 Hypothyroidism, unspecified: Secondary | ICD-10-CM

## 2018-04-05 DIAGNOSIS — E785 Hyperlipidemia, unspecified: Secondary | ICD-10-CM

## 2018-04-05 DIAGNOSIS — R7301 Impaired fasting glucose: Secondary | ICD-10-CM

## 2018-04-05 DIAGNOSIS — R7989 Other specified abnormal findings of blood chemistry: Secondary | ICD-10-CM

## 2018-04-05 DIAGNOSIS — Z79899 Other long term (current) drug therapy: Secondary | ICD-10-CM

## 2018-04-05 NOTE — Telephone Encounter (Signed)
Spoke with pt and informed him of Dr. Lupita Dawn message below. The pt gave a verbal understanding.

## 2018-04-05 NOTE — Telephone Encounter (Signed)
Dr Delana Meyer thinks he should see a neurologist, because he didn't find any severe circulation issues,  Is he willing to see one?

## 2018-04-05 NOTE — Telephone Encounter (Signed)
No, it is not all right.  Patients don't get to "add " on tests  ON A WHIM, without discussing why they want it and he wwill have to wait until the visit

## 2018-04-05 NOTE — Telephone Encounter (Signed)
Pt is having lab work done on 05/28/2018. I ordered CBC, CMP, A1c, Lipid, Testosterone, TSH. Pt stated that he would like to have a CRP checked, he stated that he has never had that done before. Is it okay to order and is there anything that I forgot to order?

## 2018-04-16 ENCOUNTER — Ambulatory Visit: Payer: PPO | Admitting: Psychology

## 2018-04-16 DIAGNOSIS — F339 Major depressive disorder, recurrent, unspecified: Secondary | ICD-10-CM | POA: Diagnosis not present

## 2018-04-16 NOTE — Telephone Encounter (Signed)
Pt says he would like Dr. Derrel Nip to refer him to a neurologist.

## 2018-04-18 NOTE — Telephone Encounter (Signed)
Attempted to call pt no answer no voicemail. Please transfer pt to our office if he calls back.

## 2018-04-22 NOTE — Telephone Encounter (Signed)
LMTCB. Please transfer pt to our office.  

## 2018-04-22 NOTE — Telephone Encounter (Signed)
Dr. Delana Meyer recommended that pt see a neurologist due to balance and hand tremors. Pt states that both the balance and hand tremors have gotten worse and now he has started experiencing leg weakness. Pt stated that he does not need a referral with his insurance but that he would like to know who you would recommend.

## 2018-04-22 NOTE — Telephone Encounter (Signed)
Dr Manuella Ghazi at [place] ,  Or Dr Carles Collet Johann Capers in [place]

## 2018-04-23 NOTE — Telephone Encounter (Signed)
Spoke with pt and informed him of the doctors that Dr. Derrel Nip recommended. Pt gave a verbal understanding.

## 2018-05-06 ENCOUNTER — Other Ambulatory Visit: Payer: Self-pay | Admitting: Internal Medicine

## 2018-05-08 ENCOUNTER — Other Ambulatory Visit: Payer: Self-pay | Admitting: Internal Medicine

## 2018-05-08 MED ORDER — TESTOSTERONE CYPIONATE 200 MG/ML IM SOLN: INTRAMUSCULAR | 2 refills | Status: DC

## 2018-05-14 ENCOUNTER — Ambulatory Visit: Payer: PPO | Admitting: Psychology

## 2018-05-21 ENCOUNTER — Ambulatory Visit (INDEPENDENT_AMBULATORY_CARE_PROVIDER_SITE_OTHER): Payer: PPO | Admitting: Psychology

## 2018-05-21 DIAGNOSIS — F330 Major depressive disorder, recurrent, mild: Secondary | ICD-10-CM | POA: Diagnosis not present

## 2018-05-28 ENCOUNTER — Other Ambulatory Visit (INDEPENDENT_AMBULATORY_CARE_PROVIDER_SITE_OTHER): Payer: PPO

## 2018-05-28 ENCOUNTER — Other Ambulatory Visit: Payer: Self-pay

## 2018-05-28 DIAGNOSIS — R7301 Impaired fasting glucose: Secondary | ICD-10-CM

## 2018-05-28 DIAGNOSIS — Z79899 Other long term (current) drug therapy: Secondary | ICD-10-CM | POA: Diagnosis not present

## 2018-05-28 DIAGNOSIS — E785 Hyperlipidemia, unspecified: Secondary | ICD-10-CM | POA: Diagnosis not present

## 2018-05-28 DIAGNOSIS — R7989 Other specified abnormal findings of blood chemistry: Secondary | ICD-10-CM

## 2018-05-28 DIAGNOSIS — E039 Hypothyroidism, unspecified: Secondary | ICD-10-CM

## 2018-05-28 LAB — COMPREHENSIVE METABOLIC PANEL
ALT: 15 U/L (ref 0–53)
AST: 18 U/L (ref 0–37)
Albumin: 4.2 g/dL (ref 3.5–5.2)
Alkaline Phosphatase: 42 U/L (ref 39–117)
BUN: 21 mg/dL (ref 6–23)
CO2: 32 mEq/L (ref 19–32)
Calcium: 9.2 mg/dL (ref 8.4–10.5)
Chloride: 104 mEq/L (ref 96–112)
Creatinine, Ser: 1.3 mg/dL (ref 0.40–1.50)
GFR: 52.22 mL/min — ABNORMAL LOW (ref >60.00)
Glucose, Bld: 97 mg/dL (ref 70–99)
Potassium: 5.1 mEq/L (ref 3.5–5.1)
Sodium: 141 mEq/L (ref 135–145)
Total Bilirubin: 1.4 mg/dL — ABNORMAL HIGH (ref 0.2–1.2)
Total Protein: 6.6 g/dL (ref 6.0–8.3)

## 2018-05-28 LAB — CBC WITH DIFFERENTIAL/PLATELET
Basophils Absolute: 0 10*3/uL (ref 0.0–0.1)
Basophils Relative: 0.7 % (ref 0.0–3.0)
Eosinophils Absolute: 0.3 10*3/uL (ref 0.0–0.7)
Eosinophils Relative: 6.3 % — ABNORMAL HIGH (ref 0.0–5.0)
HCT: 48.1 % (ref 39.0–52.0)
Hemoglobin: 16.5 g/dL (ref 13.0–17.0)
Lymphocytes Relative: 31.6 % (ref 12.0–46.0)
Lymphs Abs: 1.6 10*3/uL (ref 0.7–4.0)
MCHC: 34.4 g/dL (ref 30.0–36.0)
MCV: 95.5 fl (ref 78.0–100.0)
Monocytes Absolute: 0.5 10*3/uL (ref 0.1–1.0)
Monocytes Relative: 9.1 % (ref 3.0–12.0)
Neutro Abs: 2.7 10*3/uL (ref 1.4–7.7)
Neutrophils Relative %: 52.3 % (ref 43.0–77.0)
Platelets: 225 10*3/uL (ref 150.0–400.0)
RBC: 5.03 Mil/uL (ref 4.22–5.81)
RDW: 13.7 % (ref 11.5–15.5)
WBC: 5.1 10*3/uL (ref 4.0–10.5)

## 2018-05-28 LAB — LIPID PANEL
Cholesterol: 177 mg/dL (ref 0–200)
HDL: 53.9 mg/dL (ref >39.00)
LDL Cholesterol: 109 mg/dL — ABNORMAL HIGH (ref 0–99)
NonHDL: 123.1
Total CHOL/HDL Ratio: 3
Triglycerides: 70 mg/dL (ref 0.0–149.0)
VLDL: 14 mg/dL (ref 0.0–40.0)

## 2018-05-28 LAB — HEMOGLOBIN A1C: Hgb A1c MFr Bld: 4.7 % (ref 4.6–6.5)

## 2018-05-28 LAB — TSH: TSH: 2 u[IU]/mL (ref 0.35–4.50)

## 2018-05-30 LAB — TESTOSTERONE, FREE, TOTAL, SHBG
Sex Hormone Binding: 57.5 nmol/L (ref 19.3–76.4)
Testosterone, Free: 15.3 pg/mL (ref 6.6–18.1)
Testosterone: 1154 ng/dL — ABNORMAL HIGH (ref 264–916)

## 2018-05-31 ENCOUNTER — Encounter: Payer: Self-pay | Admitting: Internal Medicine

## 2018-05-31 ENCOUNTER — Ambulatory Visit (INDEPENDENT_AMBULATORY_CARE_PROVIDER_SITE_OTHER): Payer: PPO | Admitting: Internal Medicine

## 2018-05-31 ENCOUNTER — Other Ambulatory Visit: Payer: Self-pay

## 2018-05-31 VITALS — BP 116/60 | HR 62 | Temp 97.7°F | Resp 16 | Ht 75.0 in | Wt 185.4 lb

## 2018-05-31 DIAGNOSIS — E785 Hyperlipidemia, unspecified: Secondary | ICD-10-CM

## 2018-05-31 DIAGNOSIS — E034 Atrophy of thyroid (acquired): Secondary | ICD-10-CM | POA: Diagnosis not present

## 2018-05-31 DIAGNOSIS — R7989 Other specified abnormal findings of blood chemistry: Secondary | ICD-10-CM | POA: Diagnosis not present

## 2018-05-31 DIAGNOSIS — Z79899 Other long term (current) drug therapy: Secondary | ICD-10-CM

## 2018-05-31 DIAGNOSIS — R1314 Dysphagia, pharyngoesophageal phase: Secondary | ICD-10-CM

## 2018-05-31 DIAGNOSIS — R29898 Other symptoms and signs involving the musculoskeletal system: Secondary | ICD-10-CM | POA: Diagnosis not present

## 2018-05-31 DIAGNOSIS — R634 Abnormal weight loss: Secondary | ICD-10-CM

## 2018-05-31 NOTE — Progress Notes (Signed)
Subjective:  Patient ID: Theodore [name], male    DOB: Nov 03, 1929  Age: 85 y.o. MRN: [id number]  CC: The primary encounter diagnosis was Low serum testosterone level. Diagnoses of Hyperlipidemia LDL goal <100, Long-term use of high-risk medication, Weight loss, unintentional, Weakness of both lower extremities, Hypothyroidism due to acquired atrophy of thyroid, and Pharyngoesophageal dysphagia were also pertinent to this visit.   HPI Theodore [name] presents for follow up on multiple chronic issues.    Screened for COVID exposure because of recent travel.  Returned from [facility]  On March 8 .  Flew,  Asymptomatic .  Cataract surgery both eyes by Dr Edison Pace . Vision not good yet. Adjusting eyeglasses  Saw [facility] for intermittent dyspnea , COPD GOLD Stage A .  No meds prescribed.   Had treadmill stress test: normal  Had testosterone level checked 2-3 days after his injection.  Wants his crp checked. Dissuaded him from doing that  Low energy by 4:00 in the afternoon   m has to take a nap  Walking is not easy , gets short of breath and legs don't want to keep going.   Has moderate PAD by recent vascular evaluation .  Has had back pain in the past,  But has not had a formal Neurology evaluation of imaging of the spine   Presyncopal episode recently while Singing with several other men,  Developed vertigo and nearly fainted   Feels ataxic when he walks,  Has leg weakness,  Has neurology appt coming up with Dr Manuella Ghazi on April 20.  to also discuss worsening bilateral hand tremor   Has been trying diapasen,  A commerical product that is supposed to help with circulation on his feet   Outpatient Medications Prior to Visit  Medication Sig Dispense Refill  . Ascorbic Acid (VITAMIN C) 1000 MG tablet Take 1,000 mg by mouth daily.    [name] b complex vitamins tablet Take 4 tablets by mouth daily.    . Bilberry, Vaccinium myrtillus, (BILBERRY PO) Take by mouth.    . cholecalciferol (VITAMIN D) 1000 UNITS  tablet Take 4,000 Units by mouth daily.     [name] co-enzyme Q-10 30 MG capsule Take 30 mg by mouth daily.     . COD LIVER OIL PO Take by mouth.    . cyanocobalamin 500 MCG tablet Take 500 mcg by mouth daily.    [name] ezetimibe (ZETIA) 10 MG tablet Take 1 tablet (10 mg total) by mouth daily. 90 tablet 3  . Flaxseed, Linseed, (FLAX SEEDS PO) Take by mouth daily. With omega 3    . IODINE, KELP, PO Take by mouth.    . levothyroxine (SYNTHROID, LEVOTHROID) 100 MCG tablet Take 1 tablet by mouth once daily before breakfast 90 tablet 1  . LUTEIN PO Take by mouth.    . magnesium oxide (MAG-OX) 400 MG tablet Take 400 mg by mouth daily.    . mometasone (ELOCON) 0.1 % cream Apply 1 application topically daily.    [name] OVER THE COUNTER MEDICATION Take 1,000 mg by mouth daily. Concentrated raw butter oil    . OVER THE COUNTER MEDICATION Take 4 tablets by mouth daily. Standard Process- Cardio Plus     . OVER THE COUNTER MEDICATION Take 1 tablet by mouth daily. Cardio Platinum - Vitamin K-2 500 mg, Arginine 300 mg, Natokinase 100 mg    . OVER THE COUNTER MEDICATION Take by mouth daily. Hemp oil    . OVER THE COUNTER MEDICATION Take  133 mg by mouth 2 (two) times daily. Fenukgreek    . Phosphatidylserine 100 MG CAPS Take by mouth daily.    . Probiotic Product (PROBIOTIC ACIDOPHILUS) CAPS Take 1 capsule by mouth daily.    . RESTASIS 0.05 % ophthalmic emulsion Place 1 drop into both eyes 2 (two) times daily.    . S-Adenosylmethionine (SAM-E PO) Take by mouth.    . testosterone cypionate (DEPOTESTOSTERONE CYPIONATE) 200 MG/ML injection Inject  0.5 mL into muscle every 14 days 1 mL 2  . TURMERIC PO Take by mouth.    . vitamin E (VITAMIN E) 400 UNIT capsule Take 400 Units by mouth daily.    . Syringe/Needle, Disp, (SYRINGE 3CC/25GX1") 25G X 1" 3 ML MISC Use for b12 injections (Patient not taking: Reported on 05/31/2018) 50 each 0  . NEEDLE, DISP, 25 G (EASY TOUCH FLIPLOCK NEEDLES) 25G X 1" MISC Inject 1 application into the  muscle once a week. Use for b12 injections (Patient not taking: Reported on 05/31/2018) 12 each 0  . SYRINGE-NEEDLE, DISP, 3 ML (LUER LOCK SAFETY SYRINGES) 25G X 1" 3 ML MISC Inject 1 application into the muscle once a week. Use to for b12 injections (Patient not taking: Reported on 05/31/2018) 12 each 0   No facility-administered medications prior to visit.     Review of Systems;  Patient denies headache, fevers, malaise, unintentional weight loss, skin rash, eye pain, sinus congestion and sinus pain, sore throat, dysphagia,  hemoptysis , cough, dyspnea, wheezing, chest pain, palpitations, orthopnea, edema, abdominal pain, nausea, melena, diarrhea, constipation, flank pain, dysuria, hematuria, urinary  Frequency, nocturia, numbness, tingling, seizures,  Focal weakness, Loss of consciousness,  Tremor, insomnia, depression, anxiety, and suicidal ideation.      Objective:  BP 116/60 (BP Location: Left Arm, Patient Position: Sitting, Cuff Size: Normal)   Pulse 62   Temp 97.7 F (36.5 C) (Oral)   Resp 16   Ht 6\' 3"  (1.905 m)   Wt 185 lb 6.4 oz (84.1 kg)   SpO2 97%   BMI 23.17 kg/m   BP Readings from Last 3 Encounters:  05/31/18 116/60  04/04/18 (!) 150/75  03/21/18 140/72    Wt Readings from Last 3 Encounters:  05/31/18 185 lb 6.4 oz (84.1 kg)  04/04/18 181 lb 12.8 oz (82.5 kg)  03/21/18 186 lb (84.4 kg)    General appearance: alert, cooperative and appears stated age Ears: normal TM's and external ear canals both ears Throat: lips, mucosa, and tongue normal; teeth and gums normal Neck: no adenopathy, no carotid bruit, supple, symmetrical, trachea midline and thyroid not enlarged, symmetric, no tenderness/mass/nodules Back: symmetric, no curvature. ROM normal. No CVA tenderness. Lungs: clear to auscultation bilaterally Heart: regular rate and rhythm, S1, S2 normal, no murmur, click, rub or gallop Abdomen: soft, non-tender; bowel sounds normal; no masses,  no organomegaly Pulses:  2+ and symmetric Skin: Skin color, texture, turgor normal. No rashes or lesions Lymph nodes: Cervical, supraclavicular, and axillary nodes normal.  Lab Results  Component Value Date   HGBA1C 4.7 05/28/2018    Lab Results  Component Value Date   CREATININE 1.30 05/28/2018   CREATININE 1.22 03/22/2017   CREATININE 1.19 09/18/2016    Lab Results  Component Value Date   WBC 5.1 05/28/2018   HGB 16.5 05/28/2018   HCT 48.1 05/28/2018   PLT 225.0 05/28/2018   GLUCOSE 97 05/28/2018   CHOL 177 05/28/2018   TRIG 70.0 05/28/2018   HDL 53.90 05/28/2018   LDLCALC 109 (  H) 05/28/2018   ALT 15 05/28/2018   AST 18 05/28/2018   NA 141 05/28/2018   K 5.1 05/28/2018   CL 104 05/28/2018   CREATININE 1.30 05/28/2018   BUN 21 05/28/2018   CO2 32 05/28/2018   TSH 2.00 05/28/2018   PSA 3.65 07/01/2014   HGBA1C 4.7 05/28/2018    No results found.  Assessment & Plan:   Problem List Items Addressed This Visit    Weight loss, unintentional    Resolved, weight is stable . Work up included and EGD in [facility] 2019      Weakness of both lower extremities    Recommending neurologic evaluation prior to attributing symptoms to PAD       Low serum testosterone level - Primary    With poor quality of life when testsoterone supplementation is suspended.  Continue 0.25 ml every 2 weeks pending next level  Mid trough      Relevant Orders   Testos,Total,Free and SHBG (Male)   Long-term use of high-risk medication   Relevant Orders   CBC with Differential/Platelet   Comprehensive metabolic panel   Hypothyroidism    Thyroid function is WNL on current dose.  No current changes needed.   Lab Results  Component Value Date   TSH 2.00 05/28/2018         Hyperlipidemia LDL goal <100    Managed with Zetia  Lab Results  Component Value Date   CHOL 177 05/28/2018   HDL 53.90 05/28/2018   LDLCALC 109 (H) 05/28/2018   TRIG 70.0 05/28/2018   CHOLHDL 3 05/28/2018   Lab Results  Component  Value Date   ALT 15 05/28/2018   AST 18 05/28/2018   ALKPHOS 42 05/28/2018   BILITOT 1.4 (H) 05/28/2018         Relevant Orders   Lipid panel   Dysphagia    Secondary to presbyesophagus noted on 2018 EGD         I have discontinued Carloyn Manner A. Eoff's SYRINGE-NEEDLE (DISP) 3 ML and NEEDLE (DISP) 25 G. I am also having him maintain his COD LIVER OIL PO, cholecalciferol, vitamin E, vitamin B-12, magnesium oxide, co-enzyme Q-10, OVER THE COUNTER MEDICATION, OVER THE COUNTER MEDICATION, OVER THE COUNTER MEDICATION, Probiotic Acidophilus BioBeads, OVER THE COUNTER MEDICATION, Restasis, vitamin C, mometasone, SYRINGE 3CC/25GX1", TURMERIC PO, S-Adenosylmethionine (SAM-E PO), (Bilberry, Vaccinium myrtillus, (BILBERRY PO)), LUTEIN PO, (Flaxseed, Linseed, (FLAX SEEDS PO)), (IODINE, KELP, PO), Phosphatidylserine, b complex vitamins, OVER THE COUNTER MEDICATION, levothyroxine, ezetimibe, and testosterone cypionate.  No orders of the defined types were placed in this encounter.   Medications Discontinued During This Encounter  Medication Reason  . NEEDLE, DISP, 25 G (EASY TOUCH FLIPLOCK NEEDLES) 25G X 1" MISC Error  . SYRINGE-NEEDLE, DISP, 3 ML (LUER LOCK SAFETY SYRINGES) 25G X 1" 3 ML MISC Error    Follow-up: Return in about 3 months (around 08/31/2018).   [name], MD

## 2018-05-31 NOTE — Patient Instructions (Addendum)
Please mention your leg weakness  to Dr. Manuella Ghazi when you see him on April 20th to discuss your hand tremor .  Dr Delana Meyer would like you to rule out a neurologic cause of your leg weakness before attributing it to your circulation issues   Continue a walking program daily,  30 minutes minimum   Your foot swelling is due to "venous insufficiency" and can be managed with light weight compression stockings.. (tighter stockings are not advised because of your arterial circulation issues)  Your testosterone level needs to be rechecked 10 days after your injection. You can coordinate your next 3 month lab draw to accommodate this need

## 2018-06-02 ENCOUNTER — Encounter: Payer: Self-pay | Admitting: Internal Medicine

## 2018-06-02 NOTE — Assessment & Plan Note (Signed)
Secondary to presbyesophagus noted on 2018 EGD

## 2018-06-02 NOTE — Assessment & Plan Note (Addendum)
Recommending neurologic evaluation prior to attributing symptoms to PAD

## 2018-06-02 NOTE — Assessment & Plan Note (Addendum)
Resolved, weight is stable . Work up included and EGD in [facility] 2019

## 2018-06-02 NOTE — Assessment & Plan Note (Signed)
Managed with Zetia  Lab Results  Component Value Date   CHOL 177 05/28/2018   HDL 53.90 05/28/2018   LDLCALC 109 (H) 05/28/2018   TRIG 70.0 05/28/2018   CHOLHDL 3 05/28/2018   Lab Results  Component Value Date   ALT 15 05/28/2018   AST 18 05/28/2018   ALKPHOS 42 05/28/2018   BILITOT 1.4 (H) 05/28/2018

## 2018-06-02 NOTE — Assessment & Plan Note (Signed)
Thyroid function is WNL on current dose.  No current changes needed.   Lab Results  Component Value Date   TSH 2.00 05/28/2018

## 2018-06-02 NOTE — Assessment & Plan Note (Signed)
With poor quality of life when testsoterone supplementation is suspended.  Continue 0.25 ml every 2 weeks pending next level  Mid trough

## 2018-06-18 ENCOUNTER — Ambulatory Visit: Payer: PPO | Admitting: Psychology

## 2018-07-02 ENCOUNTER — Other Ambulatory Visit: Payer: Self-pay

## 2018-07-02 MED ORDER — LEVOTHYROXINE SODIUM 100 MCG PO TABS: ORAL_TABLET | ORAL | 1 refills | Status: DC

## 2018-07-09 ENCOUNTER — Ambulatory Visit (INDEPENDENT_AMBULATORY_CARE_PROVIDER_SITE_OTHER): Payer: PPO | Admitting: Psychology

## 2018-07-09 DIAGNOSIS — F330 Major depressive disorder, recurrent, mild: Secondary | ICD-10-CM | POA: Diagnosis not present

## 2018-07-30 ENCOUNTER — Ambulatory Visit (INDEPENDENT_AMBULATORY_CARE_PROVIDER_SITE_OTHER): Payer: PPO | Admitting: Psychology

## 2018-07-30 DIAGNOSIS — F330 Major depressive disorder, recurrent, mild: Secondary | ICD-10-CM | POA: Diagnosis not present

## 2018-08-01 DIAGNOSIS — H04123 Dry eye syndrome of bilateral lacrimal glands: Secondary | ICD-10-CM | POA: Diagnosis not present

## 2018-08-06 ENCOUNTER — Other Ambulatory Visit: Payer: Self-pay | Admitting: Internal Medicine

## 2018-08-07 NOTE — Telephone Encounter (Signed)
Refilled: 05/08/2018 Last OV: 05/31/2018 Next OV: 09/02/2018

## 2018-08-12 DIAGNOSIS — M542 Cervicalgia: Secondary | ICD-10-CM | POA: Insufficient documentation

## 2018-08-14 DIAGNOSIS — M5412 Radiculopathy, cervical region: Secondary | ICD-10-CM | POA: Insufficient documentation

## 2018-08-14 DIAGNOSIS — M542 Cervicalgia: Secondary | ICD-10-CM | POA: Diagnosis not present

## 2018-08-19 ENCOUNTER — Telehealth: Payer: Self-pay | Admitting: *Deleted

## 2018-08-19 ENCOUNTER — Other Ambulatory Visit: Payer: Self-pay

## 2018-08-19 ENCOUNTER — Other Ambulatory Visit (INDEPENDENT_AMBULATORY_CARE_PROVIDER_SITE_OTHER): Payer: PPO

## 2018-08-19 DIAGNOSIS — Z79899 Other long term (current) drug therapy: Secondary | ICD-10-CM

## 2018-08-19 DIAGNOSIS — R7989 Other specified abnormal findings of blood chemistry: Secondary | ICD-10-CM

## 2018-08-19 DIAGNOSIS — E785 Hyperlipidemia, unspecified: Secondary | ICD-10-CM

## 2018-08-19 LAB — COMPREHENSIVE METABOLIC PANEL
ALT: 15 U/L (ref 0–53)
AST: 17 U/L (ref 0–37)
Albumin: 4.1 g/dL (ref 3.5–5.2)
Alkaline Phosphatase: 41 U/L (ref 39–117)
BUN: 25 mg/dL — ABNORMAL HIGH (ref 6–23)
CO2: 29 mEq/L (ref 19–32)
Calcium: 8.8 mg/dL (ref 8.4–10.5)
Chloride: 105 mEq/L (ref 96–112)
Creatinine, Ser: 1.21 mg/dL (ref 0.40–1.50)
GFR: 56.7 mL/min — ABNORMAL LOW (ref >60.00)
Glucose, Bld: 91 mg/dL (ref 70–99)
Potassium: 4.2 mEq/L (ref 3.5–5.1)
Sodium: 140 mEq/L (ref 135–145)
Total Bilirubin: 1 mg/dL (ref 0.2–1.2)
Total Protein: 6.1 g/dL (ref 6.0–8.3)

## 2018-08-19 LAB — CBC WITH DIFFERENTIAL/PLATELET
Basophils Absolute: 0 10*3/uL (ref 0.0–0.1)
Basophils Relative: 0.6 % (ref 0.0–3.0)
Eosinophils Absolute: 0.3 10*3/uL (ref 0.0–0.7)
Eosinophils Relative: 4.7 % (ref 0.0–5.0)
HCT: 48.8 % (ref 39.0–52.0)
Hemoglobin: 16.9 g/dL (ref 13.0–17.0)
Lymphocytes Relative: 41.2 % (ref 12.0–46.0)
Lymphs Abs: 2.9 10*3/uL (ref 0.7–4.0)
MCHC: 34.7 g/dL (ref 30.0–36.0)
MCV: 95.8 fl (ref 78.0–100.0)
Monocytes Absolute: 0.7 10*3/uL (ref 0.1–1.0)
Monocytes Relative: 10 % (ref 3.0–12.0)
Neutro Abs: 3.1 10*3/uL (ref 1.4–7.7)
Neutrophils Relative %: 43.5 % (ref 43.0–77.0)
Platelets: 246 10*3/uL (ref 150.0–400.0)
RBC: 5.1 Mil/uL (ref 4.22–5.81)
RDW: 13.9 % (ref 11.5–15.5)
WBC: 7.1 10*3/uL (ref 4.0–10.5)

## 2018-08-19 LAB — LIPID PANEL
Cholesterol: 166 mg/dL (ref 0–200)
HDL: 57.4 mg/dL (ref >39.00)
LDL Cholesterol: 97 mg/dL (ref 0–99)
NonHDL: 109.06
Total CHOL/HDL Ratio: 3
Triglycerides: 59 mg/dL (ref 0.0–149.0)
VLDL: 11.8 mg/dL (ref 0.0–40.0)

## 2018-08-19 NOTE — Telephone Encounter (Signed)
Please advise if patient needs his testosterone checked. If so, please place future order.

## 2018-08-19 NOTE — Telephone Encounter (Signed)
SOMEBODY allowed this patient to come in without an appointment. THAT means he was not appropriately screened by the front office ? THAT CANNOT HAPPEN, NOT WITH COVID 19 CASES POPPING UP LEFT AND RIGHT  This case needs to be explored: the who what and why

## 2018-08-19 NOTE — Telephone Encounter (Signed)
Testosterone level reordered.

## 2018-08-19 NOTE — Telephone Encounter (Signed)
Pt walked in for labs (no appt) & states that he was told to just ome in for a testosterone lab. No order found for that test. Please place order if needed. The labs that were ordered previously were also drawn.  Also, this patient was stuck twice by [facility] with no success. I went to assess the patient to attempt to collect his blood and noticed that he looked clamy & sweaty. I asked him if he was okay several times & he states that he was. I asked one more time just before sticking him & he said he felt dizzy.  I did not draw him at that time. I was able to get him cooled down & gave him juice. One he felt better, I drew him & let him go once he was okay.  Pt states that has never happened during a blood draw before & kept apologizing. I assured him that it was a natural response & nothing to apologize for.

## 2018-08-19 NOTE — Telephone Encounter (Signed)
Dr. Derrel Nip,  I will get clarification as to what actually occurred

## 2018-08-20 DIAGNOSIS — M5412 Radiculopathy, cervical region: Secondary | ICD-10-CM | POA: Diagnosis not present

## 2018-08-20 DIAGNOSIS — M542 Cervicalgia: Secondary | ICD-10-CM | POA: Diagnosis not present

## 2018-08-20 NOTE — Addendum Note (Signed)
Addended by: Leeanne Rio on: 08/20/2018 08:32 AM   Modules accepted: Orders

## 2018-08-26 ENCOUNTER — Ambulatory Visit (INDEPENDENT_AMBULATORY_CARE_PROVIDER_SITE_OTHER): Payer: PPO | Admitting: Psychology

## 2018-08-26 DIAGNOSIS — M542 Cervicalgia: Secondary | ICD-10-CM | POA: Diagnosis not present

## 2018-08-26 DIAGNOSIS — F330 Major depressive disorder, recurrent, mild: Secondary | ICD-10-CM

## 2018-08-26 DIAGNOSIS — M5412 Radiculopathy, cervical region: Secondary | ICD-10-CM | POA: Diagnosis not present

## 2018-08-26 LAB — TESTOS,TOTAL,FREE AND SHBG (FEMALE)
Free Testosterone: 140.4 pg/mL — ABNORMAL HIGH (ref 30.0–135.0)
Sex Hormone Binding: 38 nmol/L (ref 22–77)
Testosterone, Total, LC-MS-MS: 731 ng/dL (ref 250–1100)

## 2018-08-29 DIAGNOSIS — H04123 Dry eye syndrome of bilateral lacrimal glands: Secondary | ICD-10-CM | POA: Diagnosis not present

## 2018-08-30 ENCOUNTER — Encounter: Payer: Self-pay | Admitting: *Deleted

## 2018-08-30 DIAGNOSIS — M542 Cervicalgia: Secondary | ICD-10-CM | POA: Diagnosis not present

## 2018-08-30 DIAGNOSIS — M5412 Radiculopathy, cervical region: Secondary | ICD-10-CM | POA: Diagnosis not present

## 2018-09-02 ENCOUNTER — Ambulatory Visit (INDEPENDENT_AMBULATORY_CARE_PROVIDER_SITE_OTHER): Payer: PPO | Admitting: Internal Medicine

## 2018-09-02 ENCOUNTER — Other Ambulatory Visit: Payer: Self-pay

## 2018-09-02 ENCOUNTER — Encounter: Payer: Self-pay | Admitting: Internal Medicine

## 2018-09-02 DIAGNOSIS — I739 Peripheral vascular disease, unspecified: Secondary | ICD-10-CM

## 2018-09-02 DIAGNOSIS — Z79899 Other long term (current) drug therapy: Secondary | ICD-10-CM

## 2018-09-02 DIAGNOSIS — F4321 Adjustment disorder with depressed mood: Secondary | ICD-10-CM | POA: Insufficient documentation

## 2018-09-02 DIAGNOSIS — R7989 Other specified abnormal findings of blood chemistry: Secondary | ICD-10-CM

## 2018-09-02 DIAGNOSIS — R0609 Other forms of dyspnea: Secondary | ICD-10-CM | POA: Diagnosis not present

## 2018-09-02 DIAGNOSIS — R29898 Other symptoms and signs involving the musculoskeletal system: Secondary | ICD-10-CM

## 2018-09-02 DIAGNOSIS — R0600 Dyspnea, unspecified: Secondary | ICD-10-CM

## 2018-09-02 DIAGNOSIS — F418 Other specified anxiety disorders: Secondary | ICD-10-CM

## 2018-09-02 MED ORDER — TESTOSTERONE CYPIONATE 100 MG/ML IM SOLN: 100.0000 mg | INTRAMUSCULAR | 0 refills | Status: DC

## 2018-09-02 MED ORDER — ALPRAZOLAM 0.25 MG PO TABS: 0.2500 mg | ORAL_TABLET | Freq: Every evening | ORAL | 1 refills | Status: DC | PRN

## 2018-09-02 MED ORDER — BUPROPION HCL ER (XL) 150 MG PO TB24: 150.0000 mg | ORAL_TABLET | Freq: Every day | ORAL | 1 refills | Status: DC

## 2018-09-02 NOTE — Assessment & Plan Note (Signed)
Dosing may be inaccurate due to problematic delivery.  decreasing strength to 100 mg /ml,  Recommend he continue 100 mg every 2 weeks but will recheck level in about 6 weeks

## 2018-09-02 NOTE — Assessment & Plan Note (Signed)
Trial of wellbutrin XL 150 mg.  Follow up 2 weeks.  Marland Kitchenalprazolam refilled for prn use.

## 2018-09-02 NOTE — Assessment & Plan Note (Signed)
Mild, non progressive by annual vascular follow up

## 2018-09-02 NOTE — Progress Notes (Signed)
Virtual Visit via [email or web address]  This visit type was conducted due to national recommendations for restrictions regarding the COVID-19 pandemic (e.g. social distancing).  This format is felt to be most appropriate for this patient at this time.  All issues noted in this document were discussed and addressed.  No physical exam was performed (except for noted visual exam findings with Video Visits).   I connected with@ on 09/02/18 at  8:30 AM EDT by a video enabled telemedicine application or telephone and verified that I am speaking with the correct person using two identifiers. Location patient: home Location provider: work or home office Persons participating in the virtual visit: patient, provider  I discussed the limitations, risks, security and privacy concerns of performing an evaluation and management service by telephone and the availability of in person appointments. I also discussed with the patient that there may be a patient responsible charge related to this service. The patient expressed understanding and agreed to proceed.   Reason for visit: 3 month follow up  HPI:  1) hypotestosterone: reviewed last level which was high normal.  The draw was midway through his 2 week interval,  He does admit that he has trouble drawing up an accurate  0.5 ml dose,  So we discussed Changing strength of liquids to 100 mg /ml   So he can use 1 ml dosing .  Reviewed the significance of his lower SHBG  Per his request.   2) depression with anxiety.  Has been  Using 0.125 mg alprazolam  For early wakeups . Having a hard time because he is grieving.  Had to euthanize his beloved cat f only 5 years last week due to an acute illness.  Has been going to therapy for several months.,  Medication recommended.  Has tolerated wellbutrin int he past.  Discussed Starting wellbutrin   3) leg weakness: walking  1 mile . Leg muscles feel tired but not painful. .  Stops, no pain.  Mild PAD per vascular annula evaluation    No progression.  Feels he has a  shuffling gait .  Advised to reschedule neurology for evaluation given several symptoms of PD  Prosate CA screen: wants to understand why no PSA has been done lately.  Discussion given.     ROS: See pertinent positives and negatives per HPI.  Past Medical History:  Diagnosis Date  . Arthritis    knees  . Cancer [facility]) 2002   melanoma right ear  . Colon polyp   . COPD (chronic obstructive pulmonary disease) ([place])    pt said he believes it is a misdiagnosis   . Depression   . Dysrhythmia    PVCs  . History of kidney stones   . Hypothyroidism   . Rhinitis   . Thyroid disease    hypothyroid  . Tremors of nervous system    Benign  . Wears dentures    partial upper and lower  . Wears hearing aid in both ears     Past Surgical History:  Procedure Laterality Date  . ADENOIDECTOMY    . CATARACT EXTRACTION W/PHACO Left 10/22/2017   Procedure: CATARACT EXTRACTION PHACO AND INTRAOCULAR LENS PLACEMENT ([place])  LEFT;  Surgeon: Eulogio Bear, MD;  Location: [place];  Service: Ophthalmology;  Laterality: Left;  . CATARACT EXTRACTION W/PHACO Right 11/20/2017   Procedure: CATARACT EXTRACTION PHACO AND INTRAOCULAR LENS PLACEMENT ([place]) RIGHT;  Surgeon: Eulogio Bear, MD;  Location: [place];  Service: Ophthalmology;  Laterality: Right;  .  ESOPHAGOGASTRODUODENOSCOPY (EGD) WITH PROPOFOL N/A 10/31/2016   Procedure: ESOPHAGOGASTRODUODENOSCOPY (EGD) WITH PROPOFOL;  Surgeon: Lollie Sails, MD;  Location[facility] END[facility]COPY;  Service: Endoscopy;  Laterality: N/A;  . JOINT REPLACEMENT Right 2003   knee  . knee meniscus repair Right   . MELANOMA EXCISION Right    ear. Followed by Dr. Nehemiah Massed  . PATELLECTOMY Bilateral   . TONSILLECTOMY      Family History  Problem Relation Age of Onset  . Hypertension Mother   . Heart disease Mother        CHF  . Heart disease Father   . Cancer Sister        melanoma    SOCIAL HX: married,   Retired, plays the [employer].  NO alcohol or tobacco    Current Outpatient Medications:  .  Ascorbic Acid (VITAMIN C) 1000 MG tablet, Take 1,000 mg by mouth daily., Disp: , Rfl:  .  b complex vitamins tablet, Take 4 tablets by mouth daily., Disp: , Rfl:  .  cholecalciferol (VITAMIN D) 1000 UNITS tablet, Take 4,000 Units by mouth daily. , Disp: , Rfl:  .  co-enzyme Q-10 30 MG capsule, Take 30 mg by mouth daily. , Disp: , Rfl:  .  COD LIVER OIL PO, Take by mouth., Disp: , Rfl:  .  cyanocobalamin 500 MCG tablet, Take 500 mcg by mouth daily., Disp: , Rfl:  .  cyclobenzaprine (FLEXERIL) 5 MG tablet, TAKE 1 TABLET BY MOUTH EVERYDAY AT BEDTIME, Disp: , Rfl:  .  ezetimibe (ZETIA) 10 MG tablet, Take 1 tablet (10 mg total) by mouth daily., Disp: 90 tablet, Rfl: 3 .  Flaxseed, Linseed, (FLAX SEEDS PO), Take by mouth daily. With omega 3, Disp: , Rfl:  .  IODINE, KELP, PO, Take by mouth., Disp: , Rfl:  .  levothyroxine (SYNTHROID) 100 MCG tablet, Take 1 tablet by mouth once daily before breakfast, Disp: 90 tablet, Rfl: 1 .  LUTEIN PO, Take by mouth., Disp: , Rfl:  .  magnesium oxide (MAG-OX) 400 MG tablet, Take 400 mg by mouth daily., Disp: , Rfl:  .  mometasone (ELOCON) 0.1 % cream, Apply 1 application topically daily., Disp: , Rfl:  .  OVER THE COUNTER MEDICATION, Take 1,000 mg by mouth daily. Concentrated raw butter oil, Disp: , Rfl:  .  OVER THE COUNTER MEDICATION, Take 4 tablets by mouth daily. Standard Process- Cardio Plus , Disp: , Rfl:  .  OVER THE COUNTER MEDICATION, Take 1 tablet by mouth daily. Cardio Platinum - Vitamin K-2 500 mg, Arginine 300 mg, Natokinase 100 mg, Disp: , Rfl:  .  OVER THE COUNTER MEDICATION, Take by mouth daily. Hemp oil, Disp: , Rfl:  .  OVER THE COUNTER MEDICATION, Take 133 mg by mouth 2 (two) times daily. Fenukgreek, Disp: , Rfl:  .  Phosphatidylserine 100 MG CAPS, Take by mouth daily., Disp: , Rfl:  .  Probiotic Product (PROBIOTIC ACIDOPHILUS) CAPS, Take 1 capsule by  mouth daily., Disp: , Rfl:  .  RESTASIS 0.05 % ophthalmic emulsion, Place 1 drop into both eyes 2 (two) times daily., Disp: , Rfl:  .  S-Adenosylmethionine (SAM-E PO), Take by mouth., Disp: , Rfl:  .  traMADol (ULTRAM) 50 MG tablet, Take 50 mg by mouth every 8 (eight) hours., Disp: , Rfl:  .  TURMERIC PO, Take by mouth., Disp: , Rfl:  .  ALPRAZolam (XANAX) 0.25 MG tablet, Take 1 tablet (0.25 mg total) by mouth at bedtime as needed for anxiety., Disp:  20 tablet, Rfl: 1 .  buPROPion (WELLBUTRIN XL) 150 MG 24 hr tablet, Take 1 tablet (150 mg total) by mouth daily., Disp: 90 tablet, Rfl: 1 .  testosterone cypionate (DEPO-TESTOSTERONE) 100 MG/ML injection, Inject 1 mL (100 mg total) into the muscle every 14 (fourteen) days. For IM use only, Disp: 10 mL, Rfl: 0  EXAM:  VITALS per patient if applicable:  GENERAL: alert, oriented, appears well and in no acute distress  HEENT: atraumatic, conjunttiva clear, no obvious abnormalities on inspection of external nose and ears  NECK: normal movements of the head and neck  LUNGS: on inspection no signs of respiratory distress, breathing rate appears normal, no obvious gross SOB, gasping or wheezing  CV: no obvious cyanosis  MS: moves all visible extremities without noticeable abnormality  PSYCH/NEURO: pleasant and cooperative, no obvious depression or anxiety, speech and thought processing grossly intact  ASSESSMENT AND PLAN:  Discussed the following assessment and plan:   Depression with anxiety Trial of wellbutrin XL 150 mg.  Follow up 2 weeks.  Marland Kitchenalprazolam refilled for prn use.   DOE (dyspnea on exertion) Cardiac workup non revealing   PAD (peripheral artery disease) (HCC) Mild, non progressive by annual vascular follow up  Weakness of both lower extremities He has several mild signs and symptoms suggestive of PD>  urged to reschedule evaluation by Neurology   Low serum testosterone level Dosing may be inaccurate due to problematic  delivery.  decreasing strength to 100 mg /ml,  Recommend he continue 100 mg every 2 weeks but will recheck level in about 6 weeks    I discussed the assessment and treatment plan with the patient. The patient was provided an opportunity to ask questions and all were answered. The patient agreed with the plan and demonstrated an understanding of the instructions.   The patient was advised to call back or seek an in-person evaluation if the symptoms worsen or if the condition fails to improve as anticipated.  I provided 40 minutes of non-face-to-face time during this encounter.   [name], MD

## 2018-09-02 NOTE — Assessment & Plan Note (Signed)
He has several mild signs and symptoms suggestive of PD>  urged to reschedule evaluation by Neurology

## 2018-09-02 NOTE — Assessment & Plan Note (Signed)
Cardiac workup non revealing

## 2018-09-02 NOTE — Patient Instructions (Signed)
The new testosterone liquid is less potent,  So your dose will be 1 ml instead of 1/2 ml (easier to draw up)  We should recheck your level midway through your 2 week interval,  In about 6 weeks    I am starting you on wellbutrin XL for your depression . Take the medicaiton  with breakfast  Janett Billow will call you to set up a two week follow up

## 2018-09-04 ENCOUNTER — Other Ambulatory Visit: Payer: Self-pay | Admitting: Internal Medicine

## 2018-09-04 DIAGNOSIS — M542 Cervicalgia: Secondary | ICD-10-CM | POA: Diagnosis not present

## 2018-09-04 DIAGNOSIS — M5412 Radiculopathy, cervical region: Secondary | ICD-10-CM | POA: Diagnosis not present

## 2018-09-04 NOTE — Telephone Encounter (Signed)
Medication Refill - Medication: buPROPion (WELLBUTRIN XL) 150 MG 24 hr tablet  Refill request.  Patient is all out of medication.  Preferred Pharmacy (with phone number or street name):  [id number] [name], [place] [phone] (Phone) [phone] (Fax)

## 2018-09-04 NOTE — Telephone Encounter (Signed)
Medication has been sent.  

## 2018-09-11 DIAGNOSIS — M5412 Radiculopathy, cervical region: Secondary | ICD-10-CM | POA: Diagnosis not present

## 2018-09-11 DIAGNOSIS — M542 Cervicalgia: Secondary | ICD-10-CM | POA: Diagnosis not present

## 2018-09-12 DIAGNOSIS — M542 Cervicalgia: Secondary | ICD-10-CM | POA: Diagnosis not present

## 2018-09-18 DIAGNOSIS — M542 Cervicalgia: Secondary | ICD-10-CM | POA: Diagnosis not present

## 2018-09-18 DIAGNOSIS — M5412 Radiculopathy, cervical region: Secondary | ICD-10-CM | POA: Diagnosis not present

## 2018-09-23 ENCOUNTER — Ambulatory Visit (INDEPENDENT_AMBULATORY_CARE_PROVIDER_SITE_OTHER): Payer: PPO | Admitting: Internal Medicine

## 2018-09-23 ENCOUNTER — Encounter: Payer: Self-pay | Admitting: Internal Medicine

## 2018-09-23 DIAGNOSIS — J449 Chronic obstructive pulmonary disease, unspecified: Secondary | ICD-10-CM

## 2018-09-23 DIAGNOSIS — F418 Other specified anxiety disorders: Secondary | ICD-10-CM | POA: Diagnosis not present

## 2018-09-23 DIAGNOSIS — I739 Peripheral vascular disease, unspecified: Secondary | ICD-10-CM

## 2018-09-23 DIAGNOSIS — R29898 Other symptoms and signs involving the musculoskeletal system: Secondary | ICD-10-CM

## 2018-09-23 NOTE — Progress Notes (Signed)
Virtual Visit [email or web address]  This visit type was conducted due to national recommendations for restrictions regarding the COVID-19 pandemic (e.g. social distancing).  This format is felt to be most appropriate for this patient at this time.  All issues noted in this document were discussed and addressed.  No physical exam was performed (except for noted visual exam findings with Video Visits).   I connected with@ on 09/23/18 at  4:30 PM EDT by a video enabled telemedicine application or telephone and verified that I am speaking with the correct person using two identifiers. Location patient: home Location provider: work or home office Persons participating in the virtual visit: patient, provider  I discussed the limitations, risks, security and privacy concerns of performing an evaluation and management service by telephone and the availability of in person appointments. I also discussed with the patient that there may be a patient responsible charge related to this service. The patient expressed understanding and agreed to proceed.  Reason for visit: follow up on depression /grief   HPI:  Trial of wellbutrin 2 weeks ago, prescribed at last visit to supplement alprazolam.  Feels significantly better  On the new medication  Still  Bothered by the fact that his legs start to feel weak after walking a distance of one mile. Left leg bothers him more than the right. Has begun a chelation therapy supplement (contains: EDTA, B6, B12 folate, and reservatrol) .  Notices a purplish color to lower legs tha t improves with leg elevation  Recent vascular workup reviewed with patient   ROS: See pertinent positives and negatives per HPI.  Past Medical History:  Diagnosis Date  . Arthritis    knees  . Cancer [facility]) 2002   melanoma right ear  . Colon polyp   . COPD (chronic obstructive pulmonary disease) ([place])    pt said he believes it is a misdiagnosis   . Depression   . Dysrhythmia    PVCs  . History of  kidney stones   . Hypothyroidism   . Rhinitis   . Thyroid disease    hypothyroid  . Tremors of nervous system    Benign  . Wears dentures    partial upper and lower  . Wears hearing aid in both ears     Past Surgical History:  Procedure Laterality Date  . ADENOIDECTOMY    . CATARACT EXTRACTION W/PHACO Left 10/22/2017   Procedure: CATARACT EXTRACTION PHACO AND INTRAOCULAR LENS PLACEMENT ([place])  LEFT;  Surgeon: Eulogio Bear, MD;  Location: [place];  Service: Ophthalmology;  Laterality: Left;  . CATARACT EXTRACTION W/PHACO Right 11/20/2017   Procedure: CATARACT EXTRACTION PHACO AND INTRAOCULAR LENS PLACEMENT ([place]) RIGHT;  Surgeon: Eulogio Bear, MD;  Location: [place];  Service: Ophthalmology;  Laterality: Right;  . ESOPHAGOGASTRODUODENOSCOPY (EGD) WITH PROPOFOL N/A 10/31/2016   Procedure: ESOPHAGOGASTRODUODENOSCOPY (EGD) WITH PROPOFOL;  Surgeon: Lollie Sails, MD;  Location: [facility] ENDOSCOPY;  Service: Endoscopy;  Laterality: N/A;  . JOINT REPLACEMENT Right 2003   knee  . knee meniscus repair Right   . MELANOMA EXCISION Right    ear. Followed by Dr. Nehemiah Massed  . PATELLECTOMY Bilateral   . TONSILLECTOMY      Family History  Problem Relation Age of Onset  . Hypertension Mother   . Heart disease Mother        CHF  . Heart disease Father   . Cancer Sister        melanoma    SOCIAL HX:  reports  that he has never smoked. He has never used smokeless tobacco. He reports current alcohol use. He reports that he does not use drugs.   Current Outpatient Medications:  .  ALPRAZolam (XANAX) 0.25 MG tablet, Take 1 tablet (0.25 mg total) by mouth at bedtime as needed for anxiety., Disp: 20 tablet, Rfl: 1 .  Ascorbic Acid (VITAMIN C) 1000 MG tablet, Take 1,000 mg by mouth daily., Disp: , Rfl:  .  b complex vitamins tablet, Take 4 tablets by mouth daily., Disp: , Rfl:  .  buPROPion (WELLBUTRIN XL) 150 MG 24 hr tablet, Take 1 tablet (150 mg total) by mouth  daily., Disp: 90 tablet, Rfl: 1 .  cholecalciferol (VITAMIN D) 1000 UNITS tablet, Take 4,000 Units by mouth daily. , Disp: , Rfl:  .  co-enzyme Q-10 30 MG capsule, Take 30 mg by mouth daily. , Disp: , Rfl:  .  COD LIVER OIL PO, Take by mouth., Disp: , Rfl:  .  cyanocobalamin 500 MCG tablet, Take 500 mcg by mouth daily., Disp: , Rfl:  .  cyclobenzaprine (FLEXERIL) 5 MG tablet, TAKE 1 TABLET BY MOUTH EVERYDAY AT BEDTIME, Disp: , Rfl:  .  ezetimibe (ZETIA) 10 MG tablet, Take 1 tablet (10 mg total) by mouth daily., Disp: 90 tablet, Rfl: 3 .  Flaxseed, Linseed, (FLAX SEEDS PO), Take by mouth daily. With omega 3, Disp: , Rfl:  .  IODINE, KELP, PO, Take by mouth., Disp: , Rfl:  .  levothyroxine (SYNTHROID) 100 MCG tablet, Take 1 tablet by mouth once daily before breakfast, Disp: 90 tablet, Rfl: 1 .  LUTEIN PO, Take by mouth., Disp: , Rfl:  .  magnesium oxide (MAG-OX) 400 MG tablet, Take 400 mg by mouth daily., Disp: , Rfl:  .  mometasone (ELOCON) 0.1 % cream, Apply 1 application topically daily., Disp: , Rfl:  .  OVER THE COUNTER MEDICATION, Take 1,000 mg by mouth daily. Concentrated raw butter oil, Disp: , Rfl:  .  OVER THE COUNTER MEDICATION, Take 4 tablets by mouth daily. Standard Process- Cardio Plus , Disp: , Rfl:  .  OVER THE COUNTER MEDICATION, Take 1 tablet by mouth daily. Cardio Platinum - Vitamin K-2 500 mg, Arginine 300 mg, Natokinase 100 mg, Disp: , Rfl:  .  OVER THE COUNTER MEDICATION, Take by mouth daily. Hemp oil, Disp: , Rfl:  .  OVER THE COUNTER MEDICATION, Take 133 mg by mouth 2 (two) times daily. Fenukgreek, Disp: , Rfl:  .  Phosphatidylserine 100 MG CAPS, Take by mouth daily., Disp: , Rfl:  .  Probiotic Product (PROBIOTIC ACIDOPHILUS) CAPS, Take 1 capsule by mouth daily., Disp: , Rfl:  .  RESTASIS 0.05 % ophthalmic emulsion, Place 1 drop into both eyes 2 (two) times daily., Disp: , Rfl:  .  S-Adenosylmethionine (SAM-E PO), Take by mouth., Disp: , Rfl:  .  testosterone cypionate  (DEPO-TESTOSTERONE) 100 MG/ML injection, Inject 1 mL (100 mg total) into the muscle every 14 (fourteen) days. For IM use only, Disp: 10 mL, Rfl: 0 .  traMADol (ULTRAM) 50 MG tablet, Take 50 mg by mouth every 8 (eight) hours., Disp: , Rfl:  .  TURMERIC PO, Take by mouth., Disp: , Rfl:   EXAM:  VITALS per patient if applicable:  GENERAL: alert, oriented, appears well and in no acute distress  HEENT: atraumatic, conjunttiva clear, no obvious abnormalities on inspection of external nose and ears  NECK: normal movements of the head and neck  LUNGS: on inspection no signs of  respiratory distress, breathing rate appears normal, no obvious gross SOB, gasping or wheezing  CV: no obvious cyanosis  MS: moves all visible extremities without noticeable abnormality  PSYCH/NEURO: pleasant and cooperative, no obvious depression or anxiety, speech and thought processing grossly intact  ASSESSMENT AND PLAN:   Weakness of both lower extremities He has several mild signs and symptoms suggestive of PD>  urged to reschedule evaluation by Neurology   PAD (peripheral artery disease) ([place]) Reviewed recent ABIs with patient.  Encouraged to continue walking daily    I discussed the assessment and treatment plan with the patient. The patient was provided an opportunity to ask questions and all were answered. The patient agreed with the plan and demonstrated an understanding of the instructions.   The patient was advised to call back or seek an in-person evaluation if the symptoms worsen or if the condition fails to improve as anticipated.  I provided  25 minutes of non-face-to-face time during this encounter.   [name], MD

## 2018-09-24 ENCOUNTER — Ambulatory Visit (INDEPENDENT_AMBULATORY_CARE_PROVIDER_SITE_OTHER): Payer: PPO | Admitting: Psychology

## 2018-09-24 DIAGNOSIS — F330 Major depressive disorder, recurrent, mild: Secondary | ICD-10-CM

## 2018-09-24 DIAGNOSIS — J449 Chronic obstructive pulmonary disease, unspecified: Secondary | ICD-10-CM | POA: Insufficient documentation

## 2018-09-24 DIAGNOSIS — Z1283 Encounter for screening for malignant neoplasm of skin: Secondary | ICD-10-CM | POA: Diagnosis not present

## 2018-09-24 DIAGNOSIS — Z85820 Personal history of malignant melanoma of skin: Secondary | ICD-10-CM | POA: Diagnosis not present

## 2018-09-24 DIAGNOSIS — D229 Melanocytic nevi, unspecified: Secondary | ICD-10-CM | POA: Diagnosis not present

## 2018-09-24 DIAGNOSIS — L820 Inflamed seborrheic keratosis: Secondary | ICD-10-CM | POA: Diagnosis not present

## 2018-09-24 DIAGNOSIS — D485 Neoplasm of uncertain behavior of skin: Secondary | ICD-10-CM | POA: Diagnosis not present

## 2018-09-24 DIAGNOSIS — D225 Melanocytic nevi of trunk: Secondary | ICD-10-CM | POA: Diagnosis not present

## 2018-09-24 DIAGNOSIS — L821 Other seborrheic keratosis: Secondary | ICD-10-CM | POA: Diagnosis not present

## 2018-09-24 DIAGNOSIS — D1800 Hemangioma unspecified site: Secondary | ICD-10-CM | POA: Diagnosis not present

## 2018-09-24 NOTE — Assessment & Plan Note (Signed)
Reviewed recent ABIs with patient.  Encouraged to continue walking daily

## 2018-09-24 NOTE — Assessment & Plan Note (Signed)
He has several mild signs and symptoms suggestive of PD>  urged to reschedule evaluation by Neurology

## 2018-09-24 NOTE — Patient Instructions (Addendum)
Continue Wellbutrin for 3 more months. Then we will discuss whether  to try weaning off of it  Continue walking,  It  Is important for your circulation

## 2018-09-25 DIAGNOSIS — M5412 Radiculopathy, cervical region: Secondary | ICD-10-CM | POA: Diagnosis not present

## 2018-09-25 DIAGNOSIS — M542 Cervicalgia: Secondary | ICD-10-CM | POA: Diagnosis not present

## 2018-10-02 DIAGNOSIS — M5412 Radiculopathy, cervical region: Secondary | ICD-10-CM | POA: Diagnosis not present

## 2018-10-02 DIAGNOSIS — M542 Cervicalgia: Secondary | ICD-10-CM | POA: Diagnosis not present

## 2018-10-16 ENCOUNTER — Other Ambulatory Visit: Payer: PPO

## 2018-10-18 ENCOUNTER — Other Ambulatory Visit (INDEPENDENT_AMBULATORY_CARE_PROVIDER_SITE_OTHER): Payer: PPO

## 2018-10-18 ENCOUNTER — Other Ambulatory Visit: Payer: Self-pay

## 2018-10-18 DIAGNOSIS — R7989 Other specified abnormal findings of blood chemistry: Secondary | ICD-10-CM

## 2018-10-18 DIAGNOSIS — Z79899 Other long term (current) drug therapy: Secondary | ICD-10-CM

## 2018-10-18 LAB — TESTOSTERONE: Testosterone: 441.42 ng/dL (ref 300.00–890.00)

## 2018-10-18 LAB — PSA, MEDICARE: PSA: 9.77 ng/ml — ABNORMAL HIGH (ref 0.10–4.00)

## 2018-10-18 NOTE — Addendum Note (Signed)
Addended by: Leeanne Rio on: 10/18/2018 08:19 AM   Modules accepted: Orders

## 2018-10-21 ENCOUNTER — Other Ambulatory Visit: Payer: Self-pay | Admitting: Internal Medicine

## 2018-10-21 DIAGNOSIS — R7989 Other specified abnormal findings of blood chemistry: Secondary | ICD-10-CM

## 2018-10-21 DIAGNOSIS — R972 Elevated prostate specific antigen [PSA]: Secondary | ICD-10-CM

## 2018-10-30 LAB — TESTOSTERONE, FREE: Testosterone, Free: 7.4 pg/mL (ref 6.6–18.1)

## 2018-10-30 LAB — TESTOSTERONE, % FREE: Testosterone-% Free: 1.3 %

## 2018-10-30 LAB — SEX HORMONE BINDING GLOBULIN: Sex Hormone Binding: 34.2 nmol/L (ref 19.3–76.4)

## 2018-11-20 ENCOUNTER — Ambulatory Visit: Payer: Self-pay | Admitting: Urology

## 2018-11-27 ENCOUNTER — Encounter: Payer: Self-pay | Admitting: Urology

## 2018-11-27 ENCOUNTER — Ambulatory Visit: Payer: Self-pay | Admitting: Urology

## 2018-11-27 ENCOUNTER — Ambulatory Visit (INDEPENDENT_AMBULATORY_CARE_PROVIDER_SITE_OTHER): Payer: PPO | Admitting: Urology

## 2018-11-27 ENCOUNTER — Other Ambulatory Visit: Payer: Self-pay

## 2018-11-27 VITALS — BP 147/75 | HR 71 | Ht 75.0 in | Wt 179.7 lb

## 2018-11-27 DIAGNOSIS — R972 Elevated prostate specific antigen [PSA]: Secondary | ICD-10-CM

## 2018-11-27 NOTE — Progress Notes (Signed)
11/27/18 3:31 PM   Theodore [name] [birth year] [id number]  Referring provider: [name], MD [address] [place],  [place] [address]  CC: Elevated PSA  HPI: I saw [name] in urology clinic today for elevated PSA from Dr. Derrel [name].  He is an 85 year old very healthy male who is been on testosterone replacement for over 20+ years for fatigue and decreased energy who was recently found to have an elevated PSA of 9.77 on 10/18/2018.  This was elevated from his prior value in 06/2014 that was 3.65, and 2.1 in 10/2006.  He denies any family history of prostate cancer.  He denies any significant urinary symptoms, gross hematuria, flank pain, weight loss, or bone pain.  There are no aggravating or alleviating factors.  Severity is mild.   PMH: Past Medical History:  Diagnosis Date  . Arthritis    knees  . Cancer [facility]) 2002   melanoma right ear  . Colon polyp   . COPD (chronic obstructive pulmonary disease) ([place])    pt said he believes it is a misdiagnosis   . Depression   . Dysrhythmia    PVCs  . History of kidney stones   . Hypothyroidism   . Rhinitis   . Thyroid disease    hypothyroid  . Tremors of nervous system    Benign  . Wears dentures    partial upper and lower  . Wears hearing aid in both ears     Surgical History: Past Surgical History:  Procedure Laterality Date  . ADENOIDECTOMY    . CATARACT EXTRACTION W/PHACO Left 10/22/2017   Procedure: CATARACT EXTRACTION PHACO AND INTRAOCULAR LENS PLACEMENT ([place])  LEFT;  Surgeon: Theodore Bear, MD;  Location: [place];  Service: Ophthalmology;  Laterality: Left;  . CATARACT EXTRACTION W/PHACO Right 11/20/2017   Procedure: CATARACT EXTRACTION PHACO AND INTRAOCULAR LENS PLACEMENT ([place]) RIGHT;  Surgeon: Theodore Bear, MD;  Location: [place];  Service: Ophthalmology;  Laterality: Right;  . ESOPHAGOGASTRODUODENOSCOPY (EGD) WITH PROPOFOL N/A 10/31/2016   Procedure:  ESOPHAGOGASTRODUODENOSCOPY (EGD) WITH PROPOFOL;  Surgeon: Theodore Sails, MD;  Location: [facility] ENDOSCOPY;  Service: Endoscopy;  Laterality: N/A;  . JOINT REPLACEMENT Right 2003   knee  . knee meniscus repair Right   . MELANOMA EXCISION Right    ear. Followed by Theodore [name]  . PATELLECTOMY Bilateral   . TONSILLECTOMY     Allergies:  Allergies  Allergen Reactions  . Penicillins Rash    Family History: Family History  Problem Relation Age of Onset  . Hypertension Mother   . Heart disease Mother        CHF  . Heart disease Father   . Cancer Sister        melanoma    Social History:  reports that he has never smoked. He has never used smokeless tobacco. He reports current alcohol use. He reports that he does not use drugs.  ROS: Please see flowsheet from today's date for complete review of systems.  Physical Exam: BP (!) 147/75 (BP Location: Left Arm, Patient Position: Sitting, Cuff Size: Normal)   Pulse 71   Ht 6\' 3"  (1.905 m)   Wt 179 lb 11.2 oz (81.5 kg)   BMI 22.46 kg/m    Constitutional:  Alert and oriented, No acute distress. Cardiovascular: No clubbing, cyanosis, or edema. Respiratory: Normal respiratory effort, no increased work of breathing. GI: Abdomen is soft, nontender, nondistended, no abdominal masses DRE: 50 g, smooth, no nodules  Lymph: No cervical or inguinal lymphadenopathy. Skin: No rashes, bruises or suspicious lesions. Neurologic: Grossly intact, no focal deficits, moving all 4 extremities. Psychiatric: Normal mood and affect.  Laboratory Data: Reviewed, see HPI  Pertinent Imaging: None to review  Assessment & Plan:   In summary, the patient is an 86 year old healthy male with no family history of prostate cancer, normal DRE, on testosterone replacement therapy with an isolated elevated PSA of 9.7 from a baseline of 3.65 in 2016.  We had a very long conversation about the AUA guidelines regarding routine screening for prostate cancer, and  that routine PSA screening is not recommended in men over age 30.  We discussed that normal PSA value was less than 6.5 for men in their 70s, but there are no typical ranges for patient in their 93s as these patients are not typically screened with PSA.  We also discussed the controversial role of testosterone replacement in regards to prostate cancer.  We discussed options including discontinuing PSA screening, repeating PSA in 2 to 3 months to trend, or stopping testosterone and repeating PSA in 2 to 3 months.  We discussed the risks and benefits of these approaches at length.  Repeat PSA with free/total ratio in 2 to 3 months, call with results If PSA remains elevated, consider trial off testosterone to see if PSA normalizes If PSA rising significantly, would consider prostate biopsy   Theodore Co, MD  [facility] [address], [place], [place] [address] [phone]

## 2018-11-27 NOTE — Patient Instructions (Signed)
Prostate Cancer Screening  The prostate is a walnut-sized gland that is located below the bladder and in front of the rectum in males. The function of the prostate (prostate gland) is to add fluid to semen during ejaculation. Prostate cancer is the second most common type of cancer in men. A screening test for cancer is a test that is done before cancer symptoms start. Screening can help to identify cancer at an early stage, when the cancer can be treated more easily. The recommended prostate cancer screening test is a blood test called the prostate-specific antigen (PSA) test. PSA is a protein that is made in the prostate. As you age, your prostate naturally produces more PSA. Abnormally high PSA levels may be caused by:  Prostate cancer.  An enlarged prostate that is not caused by cancer (benign prostatic hyperplasia, BPH). This condition is very common in older men.  A prostate gland infection (prostatitis).  Medicines to assist with hair growth, such as finasteride. Depending on the PSA results, you may need more tests, such as:  A physical exam to check the size of your prostate gland.  Blood and imaging tests.  A procedure to remove tissue samples from your prostate gland for testing (biopsy). Who should have screening? Screening recommendations vary based on age.  If you are younger than age 40, screening is not recommended.  If you are age 40-54 and you have no risk factors, screening is not recommended.  If you are younger than age 55, ask your health care provider if you need screening if you have one of these risk factors: ? Being of African-American descent. ? Having a family history of prostate cancer.  If you are age 55-69, talk with your health care provider about your need for screening and how often screening should be done.  If you are older than age 70, screening is not recommended. This is because the risks that screening can cause are greater than the benefits  that it may provide (risks outweigh the benefits). If you are at high risk for prostate cancer, your health care provider may recommend that you have screenings more often or start screening at a younger age. You may be at high risk if you:  Are older than age 55.  Are African-American.  Have a father, brother, or uncle who has been diagnosed with prostate cancer. The risk may be higher if your family member's cancer occurred at an early age. What are the benefits of screening? There is a small chance that screening may lower your risk of dying from prostate cancer. The chance is small because prostate cancer is typically a slow-growing cancer, and most men with prostate cancer die from a different cause. What are the risks of screening? The main risk of prostate cancer screening is diagnosing and treating prostate cancer that would never have caused any symptoms or problems (overdiagnosis and overtreatment). PSA screening cannot tell you if your PSA is high due to cancer or a different cause. A prostate biopsy is the only procedure to diagnose prostate cancer. Even the results of a biopsy may not tell you if your cancer needs to be treated. Slow-growing prostate cancer may not need any treatment other than monitoring, so diagnosing and treating it may cause unnecessary stress or other side effects. A prostate biopsy may also cause:  Infection or fever.  A false negative. This is a result that shows that you do not have prostate cancer when you actually do have prostate cancer. Questions   to ask your health care provider  When should I start prostate cancer screening?  What is my risk for prostate cancer?  How often do I need screening?  What type of screening tests do I need?  How do I get my test results?  What do my results mean?  Do I need treatment? Contact a health care provider if:  You have difficulty urinating.  You have pain when you urinate or ejaculate.  You have  blood in your urine or semen.  You have pain in your back or in the area of your prostate.  You have trouble getting or maintaining an erection (erectile dysfunction, ED). Summary  Prostate cancer is a common type of cancer in men. The prostate (prostate gland) is located below the bladder and in front of the rectum. This gland adds fluid to semen during ejaculation.  Prostate cancer screening may identify cancer at an early stage, when the cancer can be treated more easily.  The prostate-specific antigen (PSA) test is the recommended screening test for prostate cancer.  Discuss the risks and benefits of prostate cancer screening with your health care provider. If you are age 70 or older, screening is likely to lead to more risks than benefits (risks outweigh the benefits). This information is not intended to replace advice given to you by your health care provider. Make sure you discuss any questions you have with your health care provider. Document Released: 12/08/2016 Document Revised: 02/09/2017 Document Reviewed: 12/08/2016 Elsevier Patient Education  2020 [employer]  

## 2018-11-29 ENCOUNTER — Other Ambulatory Visit: Payer: Self-pay | Admitting: Internal Medicine

## 2018-12-11 ENCOUNTER — Ambulatory Visit: Payer: Self-pay | Admitting: Urology

## 2018-12-13 DIAGNOSIS — H04123 Dry eye syndrome of bilateral lacrimal glands: Secondary | ICD-10-CM | POA: Diagnosis not present

## 2019-01-01 DIAGNOSIS — M9905 Segmental and somatic dysfunction of pelvic region: Secondary | ICD-10-CM | POA: Diagnosis not present

## 2019-01-01 DIAGNOSIS — M5136 Other intervertebral disc degeneration, lumbar region: Secondary | ICD-10-CM | POA: Diagnosis not present

## 2019-01-01 DIAGNOSIS — M5416 Radiculopathy, lumbar region: Secondary | ICD-10-CM | POA: Diagnosis not present

## 2019-01-01 DIAGNOSIS — M9903 Segmental and somatic dysfunction of lumbar region: Secondary | ICD-10-CM | POA: Diagnosis not present

## 2019-01-02 DIAGNOSIS — M5136 Other intervertebral disc degeneration, lumbar region: Secondary | ICD-10-CM | POA: Diagnosis not present

## 2019-01-02 DIAGNOSIS — M9905 Segmental and somatic dysfunction of pelvic region: Secondary | ICD-10-CM | POA: Diagnosis not present

## 2019-01-02 DIAGNOSIS — M9903 Segmental and somatic dysfunction of lumbar region: Secondary | ICD-10-CM | POA: Diagnosis not present

## 2019-01-02 DIAGNOSIS — M5416 Radiculopathy, lumbar region: Secondary | ICD-10-CM | POA: Diagnosis not present

## 2019-01-06 DIAGNOSIS — M5136 Other intervertebral disc degeneration, lumbar region: Secondary | ICD-10-CM | POA: Diagnosis not present

## 2019-01-06 DIAGNOSIS — M5416 Radiculopathy, lumbar region: Secondary | ICD-10-CM | POA: Diagnosis not present

## 2019-01-06 DIAGNOSIS — M9903 Segmental and somatic dysfunction of lumbar region: Secondary | ICD-10-CM | POA: Diagnosis not present

## 2019-01-06 DIAGNOSIS — M9905 Segmental and somatic dysfunction of pelvic region: Secondary | ICD-10-CM | POA: Diagnosis not present

## 2019-01-08 DIAGNOSIS — M9905 Segmental and somatic dysfunction of pelvic region: Secondary | ICD-10-CM | POA: Diagnosis not present

## 2019-01-08 DIAGNOSIS — M5416 Radiculopathy, lumbar region: Secondary | ICD-10-CM | POA: Diagnosis not present

## 2019-01-08 DIAGNOSIS — M5136 Other intervertebral disc degeneration, lumbar region: Secondary | ICD-10-CM | POA: Diagnosis not present

## 2019-01-08 DIAGNOSIS — M9903 Segmental and somatic dysfunction of lumbar region: Secondary | ICD-10-CM | POA: Diagnosis not present

## 2019-01-09 DIAGNOSIS — M5136 Other intervertebral disc degeneration, lumbar region: Secondary | ICD-10-CM | POA: Diagnosis not present

## 2019-01-09 DIAGNOSIS — M5416 Radiculopathy, lumbar region: Secondary | ICD-10-CM | POA: Diagnosis not present

## 2019-01-09 DIAGNOSIS — M9903 Segmental and somatic dysfunction of lumbar region: Secondary | ICD-10-CM | POA: Diagnosis not present

## 2019-01-09 DIAGNOSIS — M9905 Segmental and somatic dysfunction of pelvic region: Secondary | ICD-10-CM | POA: Diagnosis not present

## 2019-01-13 DIAGNOSIS — M9905 Segmental and somatic dysfunction of pelvic region: Secondary | ICD-10-CM | POA: Diagnosis not present

## 2019-01-13 DIAGNOSIS — M9903 Segmental and somatic dysfunction of lumbar region: Secondary | ICD-10-CM | POA: Diagnosis not present

## 2019-01-13 DIAGNOSIS — M5416 Radiculopathy, lumbar region: Secondary | ICD-10-CM | POA: Diagnosis not present

## 2019-01-13 DIAGNOSIS — M5136 Other intervertebral disc degeneration, lumbar region: Secondary | ICD-10-CM | POA: Diagnosis not present

## 2019-01-15 DIAGNOSIS — M5136 Other intervertebral disc degeneration, lumbar region: Secondary | ICD-10-CM | POA: Diagnosis not present

## 2019-01-15 DIAGNOSIS — M9905 Segmental and somatic dysfunction of pelvic region: Secondary | ICD-10-CM | POA: Diagnosis not present

## 2019-01-15 DIAGNOSIS — M9903 Segmental and somatic dysfunction of lumbar region: Secondary | ICD-10-CM | POA: Diagnosis not present

## 2019-01-15 DIAGNOSIS — M5416 Radiculopathy, lumbar region: Secondary | ICD-10-CM | POA: Diagnosis not present

## 2019-01-16 DIAGNOSIS — M5136 Other intervertebral disc degeneration, lumbar region: Secondary | ICD-10-CM | POA: Diagnosis not present

## 2019-01-16 DIAGNOSIS — M5416 Radiculopathy, lumbar region: Secondary | ICD-10-CM | POA: Diagnosis not present

## 2019-01-16 DIAGNOSIS — M9905 Segmental and somatic dysfunction of pelvic region: Secondary | ICD-10-CM | POA: Diagnosis not present

## 2019-01-16 DIAGNOSIS — M9903 Segmental and somatic dysfunction of lumbar region: Secondary | ICD-10-CM | POA: Diagnosis not present

## 2019-01-20 DIAGNOSIS — G250 Essential tremor: Secondary | ICD-10-CM | POA: Diagnosis not present

## 2019-01-20 DIAGNOSIS — R4189 Other symptoms and signs involving cognitive functions and awareness: Secondary | ICD-10-CM | POA: Diagnosis not present

## 2019-01-20 DIAGNOSIS — R200 Anesthesia of skin: Secondary | ICD-10-CM | POA: Diagnosis not present

## 2019-01-20 DIAGNOSIS — R202 Paresthesia of skin: Secondary | ICD-10-CM | POA: Diagnosis not present

## 2019-01-20 DIAGNOSIS — E538 Deficiency of other specified B group vitamins: Secondary | ICD-10-CM | POA: Diagnosis not present

## 2019-01-20 DIAGNOSIS — E531 Pyridoxine deficiency: Secondary | ICD-10-CM | POA: Diagnosis not present

## 2019-01-20 DIAGNOSIS — E519 Thiamine deficiency, unspecified: Secondary | ICD-10-CM | POA: Diagnosis not present

## 2019-01-20 DIAGNOSIS — E559 Vitamin D deficiency, unspecified: Secondary | ICD-10-CM | POA: Diagnosis not present

## 2019-01-21 DIAGNOSIS — M5416 Radiculopathy, lumbar region: Secondary | ICD-10-CM | POA: Diagnosis not present

## 2019-01-21 DIAGNOSIS — M9903 Segmental and somatic dysfunction of lumbar region: Secondary | ICD-10-CM | POA: Diagnosis not present

## 2019-01-21 DIAGNOSIS — M9905 Segmental and somatic dysfunction of pelvic region: Secondary | ICD-10-CM | POA: Diagnosis not present

## 2019-01-21 DIAGNOSIS — M5136 Other intervertebral disc degeneration, lumbar region: Secondary | ICD-10-CM | POA: Diagnosis not present

## 2019-01-23 DIAGNOSIS — M9903 Segmental and somatic dysfunction of lumbar region: Secondary | ICD-10-CM | POA: Diagnosis not present

## 2019-01-23 DIAGNOSIS — M5136 Other intervertebral disc degeneration, lumbar region: Secondary | ICD-10-CM | POA: Diagnosis not present

## 2019-01-23 DIAGNOSIS — M5416 Radiculopathy, lumbar region: Secondary | ICD-10-CM | POA: Diagnosis not present

## 2019-01-23 DIAGNOSIS — M9905 Segmental and somatic dysfunction of pelvic region: Secondary | ICD-10-CM | POA: Diagnosis not present

## 2019-01-24 DIAGNOSIS — M9905 Segmental and somatic dysfunction of pelvic region: Secondary | ICD-10-CM | POA: Diagnosis not present

## 2019-01-24 DIAGNOSIS — M5136 Other intervertebral disc degeneration, lumbar region: Secondary | ICD-10-CM | POA: Diagnosis not present

## 2019-01-24 DIAGNOSIS — M5416 Radiculopathy, lumbar region: Secondary | ICD-10-CM | POA: Diagnosis not present

## 2019-01-24 DIAGNOSIS — M9903 Segmental and somatic dysfunction of lumbar region: Secondary | ICD-10-CM | POA: Diagnosis not present

## 2019-01-27 DIAGNOSIS — M5416 Radiculopathy, lumbar region: Secondary | ICD-10-CM | POA: Diagnosis not present

## 2019-01-27 DIAGNOSIS — M9903 Segmental and somatic dysfunction of lumbar region: Secondary | ICD-10-CM | POA: Diagnosis not present

## 2019-01-27 DIAGNOSIS — M9905 Segmental and somatic dysfunction of pelvic region: Secondary | ICD-10-CM | POA: Diagnosis not present

## 2019-01-27 DIAGNOSIS — M5136 Other intervertebral disc degeneration, lumbar region: Secondary | ICD-10-CM | POA: Diagnosis not present

## 2019-01-28 ENCOUNTER — Other Ambulatory Visit: Payer: Self-pay | Admitting: Neurology

## 2019-01-28 DIAGNOSIS — R4189 Other symptoms and signs involving cognitive functions and awareness: Secondary | ICD-10-CM

## 2019-01-29 DIAGNOSIS — M719 Bursopathy, unspecified: Secondary | ICD-10-CM | POA: Insufficient documentation

## 2019-01-29 DIAGNOSIS — M19019 Primary osteoarthritis, unspecified shoulder: Secondary | ICD-10-CM | POA: Insufficient documentation

## 2019-01-29 DIAGNOSIS — M5416 Radiculopathy, lumbar region: Secondary | ICD-10-CM | POA: Diagnosis not present

## 2019-01-29 DIAGNOSIS — M5136 Other intervertebral disc degeneration, lumbar region: Secondary | ICD-10-CM | POA: Diagnosis not present

## 2019-01-29 DIAGNOSIS — M9905 Segmental and somatic dysfunction of pelvic region: Secondary | ICD-10-CM | POA: Diagnosis not present

## 2019-01-29 DIAGNOSIS — M19219 Secondary osteoarthritis, unspecified shoulder: Secondary | ICD-10-CM | POA: Insufficient documentation

## 2019-01-29 DIAGNOSIS — M1710 Unilateral primary osteoarthritis, unspecified knee: Secondary | ICD-10-CM | POA: Insufficient documentation

## 2019-01-29 DIAGNOSIS — M25519 Pain in unspecified shoulder: Secondary | ICD-10-CM | POA: Insufficient documentation

## 2019-01-29 DIAGNOSIS — M9903 Segmental and somatic dysfunction of lumbar region: Secondary | ICD-10-CM | POA: Diagnosis not present

## 2019-01-29 DIAGNOSIS — M179 Osteoarthritis of knee, unspecified: Secondary | ICD-10-CM | POA: Insufficient documentation

## 2019-01-31 DIAGNOSIS — M5416 Radiculopathy, lumbar region: Secondary | ICD-10-CM | POA: Diagnosis not present

## 2019-01-31 DIAGNOSIS — M5136 Other intervertebral disc degeneration, lumbar region: Secondary | ICD-10-CM | POA: Diagnosis not present

## 2019-01-31 DIAGNOSIS — M9905 Segmental and somatic dysfunction of pelvic region: Secondary | ICD-10-CM | POA: Diagnosis not present

## 2019-01-31 DIAGNOSIS — M9903 Segmental and somatic dysfunction of lumbar region: Secondary | ICD-10-CM | POA: Diagnosis not present

## 2019-02-03 DIAGNOSIS — M9903 Segmental and somatic dysfunction of lumbar region: Secondary | ICD-10-CM | POA: Diagnosis not present

## 2019-02-03 DIAGNOSIS — M5136 Other intervertebral disc degeneration, lumbar region: Secondary | ICD-10-CM | POA: Diagnosis not present

## 2019-02-03 DIAGNOSIS — M9905 Segmental and somatic dysfunction of pelvic region: Secondary | ICD-10-CM | POA: Diagnosis not present

## 2019-02-03 DIAGNOSIS — M5416 Radiculopathy, lumbar region: Secondary | ICD-10-CM | POA: Diagnosis not present

## 2019-02-04 ENCOUNTER — Other Ambulatory Visit: Payer: Self-pay

## 2019-02-04 DIAGNOSIS — L820 Inflamed seborrheic keratosis: Secondary | ICD-10-CM | POA: Diagnosis not present

## 2019-02-04 DIAGNOSIS — D485 Neoplasm of uncertain behavior of skin: Secondary | ICD-10-CM | POA: Diagnosis not present

## 2019-02-04 DIAGNOSIS — Z85820 Personal history of malignant melanoma of skin: Secondary | ICD-10-CM | POA: Diagnosis not present

## 2019-02-05 DIAGNOSIS — Z85820 Personal history of malignant melanoma of skin: Secondary | ICD-10-CM | POA: Diagnosis not present

## 2019-02-05 DIAGNOSIS — D485 Neoplasm of uncertain behavior of skin: Secondary | ICD-10-CM | POA: Diagnosis not present

## 2019-02-10 ENCOUNTER — Ambulatory Visit (HOSPITAL_COMMUNITY)
Admission: RE | Admit: 2019-02-10 | Discharge: 2019-02-10 | Disposition: A | Discharge status: Home / Self Care | Payer: PPO | Source: Ambulatory Visit | Attending: Neurology | Admitting: Neurology

## 2019-02-10 ENCOUNTER — Other Ambulatory Visit: Payer: Self-pay

## 2019-02-10 DIAGNOSIS — R4189 Other symptoms and signs involving cognitive functions and awareness: Secondary | ICD-10-CM | POA: Diagnosis not present

## 2019-02-10 DIAGNOSIS — R930 Abnormal findings on diagnostic imaging of skull and head, not elsewhere classified: Secondary | ICD-10-CM | POA: Diagnosis not present

## 2019-02-10 DIAGNOSIS — G3184 Mild cognitive impairment, so stated: Secondary | ICD-10-CM | POA: Diagnosis not present

## 2019-02-12 ENCOUNTER — Encounter: Payer: Self-pay | Admitting: Internal Medicine

## 2019-02-12 ENCOUNTER — Ambulatory Visit
Admission: RE | Admit: 2019-02-12 | Discharge: 2019-02-12 | Disposition: A | Discharge status: Home / Self Care | Payer: PPO | Source: Ambulatory Visit | Attending: Internal Medicine | Admitting: Internal Medicine

## 2019-02-12 ENCOUNTER — Other Ambulatory Visit: Payer: Self-pay

## 2019-02-12 ENCOUNTER — Ambulatory Visit (INDEPENDENT_AMBULATORY_CARE_PROVIDER_SITE_OTHER): Payer: PPO | Admitting: Internal Medicine

## 2019-02-12 VITALS — BP 144/78 | HR 65 | Temp 96.9°F | Ht 75.0 in | Wt 183.4 lb

## 2019-02-12 DIAGNOSIS — R0609 Other forms of dyspnea: Secondary | ICD-10-CM

## 2019-02-12 DIAGNOSIS — R05 Cough: Secondary | ICD-10-CM | POA: Diagnosis not present

## 2019-02-12 DIAGNOSIS — J449 Chronic obstructive pulmonary disease, unspecified: Secondary | ICD-10-CM

## 2019-02-12 DIAGNOSIS — R0600 Dyspnea, unspecified: Secondary | ICD-10-CM | POA: Diagnosis not present

## 2019-02-12 MED ORDER — ALBUTEROL SULFATE HFA 108 (90 BASE) MCG/ACT IN AERS: 2.0000 | INHALATION_SPRAY | RESPIRATORY_TRACT | 6 refills | Status: DC | PRN

## 2019-02-12 NOTE — Patient Instructions (Signed)
Obtain CXR 2 view  Start ALbuterol 2-4 puffs every 4 hrs as needed for Shortness of breath

## 2019-02-12 NOTE — Progress Notes (Signed)
Name: Theodore [name] MRN: [id number] DOB: [birth year]     CONSULTATION DATE:  02/12/2019   SYNOPSIS established care for COPD  Patient uses his own NEB therapy as needed He takes electrodes and uses distilled water and mixes in [facility] and uses this in his own nebulizer He calls this concoction Colloidal Silver Nebulizers  He uses this NEB therapy as   Needed and helps a lot with chest congestion and cough This seems to be a form of Saline NEBS    Has PAD follows Vasc surgery He takes OTC EDTA -patient feels that this has helped his vascualr issues  CHIEF COMPLAINT: Follow-up COPD  HISTORY OF PRESENT ILLNESS:  Shortness of breath with intermittent Patient has increased shortness of breath and dyspnea exertion of the last 6 weeks Patient denies fevers Denies any exposure to Covid   No  COPD exacerbation at this time No evidence of heart failure at this time No evidence or signs of infection at this time No respiratory distress No fevers, chills, nausea, vomiting, diarrhea No evidence of lower extremity edema No evidence hemoptysis         Review of Systems:  Gen:  Denies  fever, sweats, chills weight loss  HEENT: Denies blurred vision, double vision, ear pain, eye pain, hearing loss, nose bleeds, sore throat Cardiac:  No dizziness, chest pain or heaviness, chest tightness,edema, No JVD Resp: + cough, -sputum production, +shortness of breath,-wheezing, -hemoptysis,  Gi: Denies swallowing difficulty, stomach pain, nausea or vomiting, diarrhea, constipation, bowel incontinence Gu:  Denies bladder incontinence, burning urine Ext:   Denies Joint pain, stiffness or swelling Skin: Denies  skin rash, easy bruising or bleeding or hives Endoc:  Denies polyuria, polydipsia , polyphagia or weight change Psych:   Denies depression, insomnia or hallucinations  Other:  All other systems negative    BP (!) 144/78 (BP Location: Left Arm, Cuff Size: Normal)   Pulse 65    Temp (!) 96.9 F (36.1 C) (Temporal)   Ht 6\' 3"  (1.905 m)   Wt 183 lb 6.4 oz (83.2 kg)   SpO2 97%   BMI 22.92 kg/m    Physical Examination:   GENERAL:NAD, no fevers, chills, no weakness no fatigue HEAD: Normocephalic, atraumatic.  EYES: PERLA, EOMI No scleral icterus.  MOUTH: Moist mucosal membrane.  EAR, NOSE, THROAT: Clear without exudates. No external lesions.  NECK: Supple. No thyromegaly.  No JVD.  PULMONARY: CTA B/L no wheezing, rhonchi, crackles CARDIOVASCULAR: S1 and S2. Regular rate and rhythm. No murmurs GASTROINTESTINAL: Soft, nontender, nondistended. Positive bowel sounds.  MUSCULOSKELETAL: No swelling, clubbing, or edema.  NEUROLOGIC: No gross focal neurological deficits. 5/5 strength all extremities SKIN: No ulceration, lesions, rashes, or cyanosis.  PSYCHIATRIC: Insight, judgment intact. -depression -anxiety ALL OTHER ROS ARE NEGATIVE           ASSESSMENT / PLAN:  85 year old pleasant white male with chronic intermittent shortness of breath and dyspnea exertion with mild COPD Gold stage a with allergic rhinitis and GERD with peripheral artery disease  Shortness of breath and dyspnea on exertion is intermittent  Patient uses homemade saline labs to salt distilled water produced for generator Shortness of breath and dyspnea exertion seems to have progressed over the last 6 weeks At this time I will have ordered a chest x-ray two-view I have also explained to patient he may need albuterol 2 to 4 puffs every 4 hours as needed  At this time if the chest x-ray albuterol does not help I  would recommend proceeding with a CT chest and also inhaled bronchodilator therapy  Mild COPD Albuterol as needed No signs of infection No signs of exacerbation at this time  Allergic rhinitis Saline sprays as needed  Peripheral artery disease Uses over-the-counter EDTA Follow-up with vascular surgery as scheduled    COVID-19 EDUCATION: The signs and symptoms of  COVID-19 were discussed with the patient and how to seek care for testing.  The importance of social distancing was discussed today. Hand Washing Techniques and avoid touching face was advised.     MEDICATION ADJUSTMENTS/LABS AND TESTS ORDERED: Home made nebulizers as needed Albuterol As needed  CURRENT MEDICATIONS REVIEWED AT LENGTH WITH PATIENT TODAY   Patient satisfied with Plan of action and management. All questions answered  Follow up in 3 months   Correll Denbow Patricia Pesa, M.D.  [facility]  Medical Director [place] Director [facility] Cardio-Pulmonary Department

## 2019-02-17 DIAGNOSIS — R4189 Other symptoms and signs involving cognitive functions and awareness: Secondary | ICD-10-CM | POA: Diagnosis not present

## 2019-02-17 DIAGNOSIS — R202 Paresthesia of skin: Secondary | ICD-10-CM | POA: Diagnosis not present

## 2019-02-17 DIAGNOSIS — R200 Anesthesia of skin: Secondary | ICD-10-CM | POA: Diagnosis not present

## 2019-02-17 DIAGNOSIS — G250 Essential tremor: Secondary | ICD-10-CM | POA: Diagnosis not present

## 2019-02-20 DIAGNOSIS — M542 Cervicalgia: Secondary | ICD-10-CM | POA: Diagnosis not present

## 2019-02-24 DIAGNOSIS — M542 Cervicalgia: Secondary | ICD-10-CM | POA: Diagnosis not present

## 2019-02-25 ENCOUNTER — Other Ambulatory Visit: Payer: Self-pay | Admitting: Internal Medicine

## 2019-02-27 DIAGNOSIS — M542 Cervicalgia: Secondary | ICD-10-CM | POA: Diagnosis not present

## 2019-03-04 DIAGNOSIS — M542 Cervicalgia: Secondary | ICD-10-CM | POA: Diagnosis not present

## 2019-03-06 DIAGNOSIS — M542 Cervicalgia: Secondary | ICD-10-CM | POA: Diagnosis not present

## 2019-03-11 ENCOUNTER — Other Ambulatory Visit: Payer: Self-pay

## 2019-03-11 DIAGNOSIS — M542 Cervicalgia: Secondary | ICD-10-CM | POA: Diagnosis not present

## 2019-03-11 MED ORDER — LEVOTHYROXINE SODIUM 100 MCG PO TABS: ORAL_TABLET | ORAL | 0 refills | Status: DC

## 2019-03-13 DIAGNOSIS — M542 Cervicalgia: Secondary | ICD-10-CM | POA: Diagnosis not present

## 2019-03-18 DIAGNOSIS — M542 Cervicalgia: Secondary | ICD-10-CM | POA: Diagnosis not present

## 2019-03-20 ENCOUNTER — Ambulatory Visit: Payer: PPO | Admitting: Family

## 2019-03-20 ENCOUNTER — Other Ambulatory Visit: Payer: Self-pay

## 2019-03-20 ENCOUNTER — Encounter: Payer: Self-pay | Admitting: Family

## 2019-03-20 VITALS — BP 140/60 | HR 68 | Ht 75.0 in | Wt 179.8 lb

## 2019-03-20 DIAGNOSIS — E785 Hyperlipidemia, unspecified: Secondary | ICD-10-CM | POA: Diagnosis not present

## 2019-03-20 DIAGNOSIS — R030 Elevated blood-pressure reading, without diagnosis of hypertension: Secondary | ICD-10-CM | POA: Diagnosis not present

## 2019-03-20 DIAGNOSIS — I25119 Atherosclerotic heart disease of native coronary artery with unspecified angina pectoris: Secondary | ICD-10-CM

## 2019-03-20 DIAGNOSIS — I739 Peripheral vascular disease, unspecified: Secondary | ICD-10-CM

## 2019-03-20 DIAGNOSIS — R001 Bradycardia, unspecified: Secondary | ICD-10-CM | POA: Diagnosis not present

## 2019-03-20 DIAGNOSIS — M542 Cervicalgia: Secondary | ICD-10-CM | POA: Diagnosis not present

## 2019-03-20 DIAGNOSIS — I493 Ventricular premature depolarization: Secondary | ICD-10-CM | POA: Diagnosis not present

## 2019-03-20 MED ORDER — EZETIMIBE 10 MG PO TABS: 10.0000 mg | ORAL_TABLET | Freq: Every day | ORAL | 3 refills | Status: DC

## 2019-03-20 NOTE — Progress Notes (Signed)
Office Visit    Patient Name: Theodore [name] Date of Encounter: 03/20/2019  Primary Care Provider:  [name], MD Primary Cardiologist:  Ida Rogue, MD Electrophysiologist:  None   Chief Complaint    Theodore [name] is a 86 y.o. male with a hx of asymptomatic bradycardia, CAD, depression, COPD, PAD, PVC presents today for annual follow-up of CAD.  Past Medical History    Past Medical History:  Diagnosis Date  . Arthritis    knees  . Cancer [facility]) 2002   melanoma right ear  . Colon polyp   . COPD (chronic obstructive pulmonary disease) ([place])    pt said he believes it is a misdiagnosis   . Depression   . Dysrhythmia    PVCs  . History of kidney stones   . Hypothyroidism   . Rhinitis   . Thyroid disease    hypothyroid  . Tremors of nervous system    Benign  . Wears dentures    partial upper and lower  . Wears hearing aid in both ears    Past Surgical History:  Procedure Laterality Date  . ADENOIDECTOMY    . CATARACT EXTRACTION W/PHACO Left 10/22/2017   Procedure: CATARACT EXTRACTION PHACO AND INTRAOCULAR LENS PLACEMENT ([place])  LEFT;  Surgeon: Eulogio Bear, MD;  Location: [facility];  Service: Ophthalmology;  Laterality: Left;  . CATARACT EXTRACTION W/PHACO Right 11/20/2017   Procedure: CATARACT EXTRACTION PHACO AND INTRAOCULAR LENS PLACEMENT ([place]) RIGHT;  Surgeon: Eulogio Bear, MD;  Location: [facility];  Service: Ophthalmology;  Laterality: Right;  . ESOPHAGOGASTRODUODENOSCOPY (EGD) WITH PROPOFOL N/A 10/31/2016   Procedure: ESOPHAGOGASTRODUODENOSCOPY (EGD) WITH PROPOFOL;  Surgeon: Lollie Sails, MD;  Location: [facility] ENDOSCOPY;  Service: Endoscopy;  Laterality: N/A;  . JOINT REPLACEMENT Right 2003   knee  . knee meniscus repair Right   . MELANOMA EXCISION Right    ear. Followed by Dr. Nehemiah Massed  . PATELLECTOMY Bilateral   . TONSILLECTOMY      Allergies  Allergies  Allergen Reactions  . Penicillins Rash    History of  Present Illness    Theodore [name] is a 86 y.o. male with a hx of asymptomatic bradycardia, CAD, depression, COPD, PAD, PVC last seen 03/21/2018 by Dr. Rockey Situ.  He has three-vessel coronary artery disease on CT scan.  Stress test with no evidence of ischemia 05/16/2016.  Echo 05/09/2016 with mild to moderate LVH, LVEF 60 to 65%, no significant valvular abnormalities.  Last seen by pulmonology 02/12/19. Noted to be more SOB over last 6 weeks and recommended to use his albuterol inhaler. CXR 02/12/19 with no acute cardiopulmonary findings.   He reports no chest pain, pressure, tightness. He lives at [facility] with his wife. Tells me he walks up and down his stairs at least 12 times per day without difficulty. He does the majority of the cooking and enjoys meals such as salmon or baked chicken. Endorses eating a low sodium diet.   Has not been checking his BP at home as his cuff has broken but reports recent BP at pulmonology office was 120s.   Continues to politely decline statin as he does not think they work.   EKGs/Labs/Other Studies Reviewed:   The following studies were reviewed today: Echo 05/09/2016 - Left ventricle: The cavity size was normal. Wall thickness was  increased increased in a pattern of mild to moderate LVH. There  was mild concentric hypertrophy. Systolic function was normal.   The  estimated ejection fraction was in the range of 60% to 65%.  Wall motion was normal; there were no regional wall motion abnormalities. Left ventricular diastolic function parameterswere normal. - Pulmonary arteries: Systolic pressure was within the normalrange.   Stress myoview 05/16/2016  Low risk study without evidence of ischemia or scar.  The left ventricular ejection fraction is hyperdynamic (>65%).  Sensitivity and specificity of the study are degraded by extracardiac activity.   EKG:  EKG is ordered today.  The ekg ordered today demonstrates SR 68 bpm with possible LAE - no acute ST/T wave  changes.  Recent Labs: 05/28/2018: TSH 2.00 08/19/2018: ALT 15; BUN 25; Creatinine, Ser 1.21; Hemoglobin 16.9; Platelets 246.0; Potassium 4.2; Sodium 140  Recent Lipid Panel    Component Value Date/Time   CHOL 166 08/19/2018 0839   TRIG 59.0 08/19/2018 0839   HDL 57.40 08/19/2018 0839   CHOLHDL 3 08/19/2018 0839   VLDL 11.8 08/19/2018 0839   LDLCALC 97 08/19/2018 0839    Home Medications   No outpatient medications have been marked as taking for the 03/20/19 encounter (Appointment) with Loel Dubonnet, NP.      Review of Systems    Review of Systems  Constitution: Negative for chills, fever and malaise/fatigue.  Cardiovascular: Negative for chest pain, dyspnea on exertion, leg swelling, near-syncope, orthopnea, palpitations and syncope.  Respiratory: Negative for cough, shortness of breath and wheezing.   Gastrointestinal: Negative for nausea and vomiting.  Neurological: Negative for dizziness, light-headedness and weakness.   All other systems reviewed and are otherwise negative except as noted above.  Physical Exam    VS:  There were no vitals taken for this visit. , BMI There is no height or weight on file to calculate BMI. GEN: Well nourished, well developed, in no acute distress. HEENT: normal. Neck: Supple, no JVD, carotid bruits, or masses. Cardiac: RRR, no murmurs, rubs, or gallops. No clubbing, cyanosis, edema.  Radials/PT 2+ and equal bilaterally.  Respiratory:  Respirations regular and unlabored, clear to auscultation bilaterally. GI: Soft, nontender, nondistended MS: No deformity or atrophy. Skin: Warm and dry, no rash. Neuro:  Strength and sensation are intact. Psych: Normal affect.   Assessment & Plan    1. CAD - 3 vessel CAD on CT scan. Subsequent stress test without ischemia. Stable with no anginal symptoms. No indication for ischemic eval at this time. No beta blocker secondary to hx of bradycardia. Declines statin, as below - is on Zetia. Regular  exercise encouraged.   2. PAD - Follows with vascular surgery. Reports LLE is more painful than previous year. Has appt next week with vascular surgery.   3. HLD, LDL goal <70 - He politely declines statin therapy. Continue Zetia. Continue to avoid fried foods.   4. COPD - Follows with Dr. Mortimer Fries of pulmonology.   5. Elevated BP without diagnosis of HTN - SBP 140 today. Tells me when seen recently by Dr. Mortimer Fries it was 120. He has not been checking recently. He is agreeable to check BP 2-3 times per week and report BP consistently >130/80. May require antihypertensive therapy.   6. Bradycardia/PVC - Noted history. Not present on EKG today. Were previously asymptomatic.    Disposition: Follow up in 1 year(s) with Dr. Rockey Situ or APP   Loel Dubonnet, NP 03/20/2019, 1:30 PM

## 2019-03-20 NOTE — Patient Instructions (Signed)
Medication Instructions:  No medication changes today.  *If you need a refill on your cardiac medications before your next appointment, please call your pharmacy*  Lab Work: No lab work today.  If you have labs (blood work) drawn today and your tests are completely normal, you will receive your results only by: [name] MyChart Message (if you have MyChart) OR . A paper copy in the mail If you have any lab test that is abnormal or we need to change your treatment, we will call you to review the results.  Testing/Procedures: You had an EKG today. It showed normal sinus rhythm.   Follow-Up: At [facility], you and your health needs are our priority.  As part of our continuing mission to provide you with exceptional heart care, we have created designated Provider Care Teams.  These Care Teams include your primary Cardiologist (physician) and Advanced Practice Providers (APPs -  Physician Assistants and Nurse Practitioners) who all work together to provide you with the care you need, when you need it.  Your next appointment:   1 year(s)  The format for your next appointment:   In Person  Provider:    You may see Ida Rogue, MD or one of the following Advanced Practice Providers on your designated Care Team:    Murray Hodgkins, NP  Christell Faith, PA-C  Marrianne Mood, PA-C  Other Instructions  Would recommend checking your blood pressure twice per week. If you notice your blood pressure is consistently more than 130/80 please call our office or Dr. Lupita Dawn office.

## 2019-03-27 ENCOUNTER — Ambulatory Visit (INDEPENDENT_AMBULATORY_CARE_PROVIDER_SITE_OTHER): Payer: PPO

## 2019-03-27 ENCOUNTER — Other Ambulatory Visit: Payer: Self-pay

## 2019-03-27 VITALS — Ht 75.0 in | Wt 179.0 lb

## 2019-03-27 DIAGNOSIS — Z0000 Encounter for general adult medical examination without abnormal findings: Secondary | ICD-10-CM | POA: Diagnosis not present

## 2019-03-27 NOTE — Patient Instructions (Addendum)
  [name] , Thank you for taking time to come for your Medicare Wellness Visit. I appreciate your ongoing commitment to your health goals. Please review the following plan we discussed and let me know if I can assist you in the future.   These are the goals we discussed: Goals      Patient Stated   . I would like to ride the stationary bike 3.5 miles 3 times weekly when able (pt-stated)       This is a list of the screening recommended for you and due dates:  Health Maintenance  Topic Date Due  . Tetanus Vaccine  08/18/2024  . Flu Shot  Completed  . Pneumonia vaccines  Completed

## 2019-03-27 NOTE — Progress Notes (Signed)
Subjective:   Theodore [name] is a 86 y.o. male who presents for Medicare Annual/Subsequent preventive examination.  Review of Systems:  No ROS.  Medicare Wellness Virtual Visit.  Visual/audio telehealth visit, UTA vital signs.   Wt/Ht provided. See social history for additional risk factors.   Cardiac Risk Factors include: advanced age (>52men, >43 women);male gender     Objective:    Vitals: Ht 6\' 3"  (1.905 m)   Wt 179 lb (81.2 kg)   BMI 22.37 kg/m   Body mass index is 22.37 kg/m.  Advanced Directives 03/27/2019 11/20/2017 10/22/2017 05/29/2017 10/31/2016 05/09/2016 05/09/2016  Does Patient Have a Medical Advance Directive? Yes Yes Yes Yes Yes Yes Yes  Type of Paramedic of [place];Living will [place];Living will [place];Living will [place];Living will [place];Living will [place] will  Does patient want to make changes to medical advance directive? No - Patient declined - No - Patient declined No - Patient declined - No - Patient declined -  Copy of [place] in Chart? No - copy requested Yes Yes No - copy requested No - copy requested No - copy requested -    Tobacco Social History   Tobacco Use  Smoking Status Never Smoker  Smokeless Tobacco Never Used     Counseling given: Not Answered   Clinical Intake:  Pre-visit preparation completed: Yes        Diabetes: No  How often do you need to have someone help you when you read instructions, pamphlets, or other written materials from your doctor or pharmacy?: 1 - Never  Interpreter Needed?: No     Past Medical History:  Diagnosis Date  . Arthritis    knees  . Cancer [facility]) 2002   melanoma right ear  . Colon polyp   . COPD (chronic obstructive pulmonary disease) ([place])    pt said he believes it is a misdiagnosis   . Depression   . Dysrhythmia    PVCs  .  History of kidney stones   . Hypothyroidism   . Rhinitis   . Thyroid disease    hypothyroid  . Tremors of nervous system    Benign  . Wears dentures    partial upper and lower  . Wears hearing aid in both ears    Past Surgical History:  Procedure Laterality Date  . ADENOIDECTOMY    . CATARACT EXTRACTION W/PHACO Left 10/22/2017   Procedure: CATARACT EXTRACTION PHACO AND INTRAOCULAR LENS PLACEMENT ([place])  LEFT;  Surgeon: Eulogio Bear, MD;  Location: [place];  Service: Ophthalmology;  Laterality: Left;  . CATARACT EXTRACTION W/PHACO Right 11/20/2017   Procedure: CATARACT EXTRACTION PHACO AND INTRAOCULAR LENS PLACEMENT ([place]) RIGHT;  Surgeon: Eulogio Bear, MD;  Location: [place];  Service: Ophthalmology;  Laterality: Right;  . ESOPHAGOGASTRODUODENOSCOPY (EGD) WITH PROPOFOL N/A 10/31/2016   Procedure: ESOPHAGOGASTRODUODENOSCOPY (EGD) WITH PROPOFOL;  Surgeon: Lollie Sails, MD;  Location: [facility] ENDOSCOPY;  Service: Endoscopy;  Laterality: N/A;  . JOINT REPLACEMENT Right 2003   knee  . knee meniscus repair Right   . MELANOMA EXCISION Right    ear. Followed by Dr. Nehemiah Massed  . PATELLECTOMY Bilateral   . TONSILLECTOMY     Family History  Problem Relation Age of Onset  . Hypertension Mother   . Heart disease Mother        CHF  . Heart disease Father   .  Cancer Sister        melanoma   Social History   Socioeconomic History  . Marital status: Married    Spouse name: [name]  . Number of children: 4  . Years of education: 74  . Highest education level: Not on file  Occupational History  . Occupation: Retired    Comment: Architect -   Tobacco Use  . Smoking status: Never Smoker  . Smokeless tobacco: Never Used  Substance and Sexual Activity  . Alcohol use: Yes    Comment: 1 glass of wine daily  . Drug use: No  . Sexual activity: Not Currently  Other Topics Concern  . Not on file  Social History Narrative   Mr. Deyoung grew up in [place], [place]. 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 He and his wife are living at [facility] He is very active at [facility]. He enjoys music.    Social Determinants of Health   Financial Resource Strain:   . Difficulty of Paying Living Expenses: Not on file  Food Insecurity: No Food Insecurity  . Worried About Charity fundraiser in the Last Year: Never true  . Ran Out of Food in the Last Year: Never true  Transportation Needs: No Transportation Needs  . Lack of Transportation (Medical): No  . Lack of Transportation (Non-Medical): No  Physical Activity: Insufficiently Active  . Days of Exercise per Week: 4 days  . Minutes of Exercise per Session: 30 min  Stress: No Stress Concern Present  . Feeling of Stress : Only a little  Social Connections: Not Isolated  . Frequency of Communication with Friends and Family: More than three times a week  . Frequency of Social Gatherings with Friends and Family: Once a week  . Attends Religious Services: 1 to 4 times per year  . Active Member of Clubs or Organizations: Yes  . Attends Archivist Meetings: More than 4 times per year  . Marital Status: Married    Outpatient Encounter Medications as of 03/27/2019  Medication Sig  . albuterol (VENTOLIN HFA) 108 (90 Base) MCG/ACT inhaler Inhale 2 puffs into the lungs every 4 (four) hours as needed for wheezing or shortness of breath.  . Ascorbic Acid (VITAMIN C) 1000 MG tablet Take 1,000 mg by mouth daily.  [name] b complex vitamins tablet Take 4 tablets by mouth daily.  [name] buPROPion (WELLBUTRIN XL) 150 MG 24 hr tablet TAKE 1 TABLET BY MOUTH EVERY DAY  . cholecalciferol (VITAMIN D) 1000 UNITS tablet Take 4,000 Units by mouth daily.   [name] co-enzyme Q-10 30 MG capsule Take 30 mg by  mouth daily.   . COD LIVER OIL PO Take by mouth.  . donepezil (ARICEPT) 5 MG tablet Take 5 mg by mouth at bedtime.   [name] ezetimibe (ZETIA) 10 MG tablet Take 1 tablet (10 mg total) by mouth daily.  . Flaxseed, Linseed, (FLAX SEEDS PO) Take by mouth daily. With omega 3  . levothyroxine (SYNTHROID) 100 MCG tablet Take 1 tablet by mouth once daily before breakfast  . LUTEIN PO Take by mouth.  . magnesium oxide (MAG-OX) 400 MG tablet Take 400 mg by mouth daily.  [name] OVER THE COUNTER MEDICATION Take 1,000 mg by mouth daily. Concentrated raw butter oil  . OVER THE COUNTER MEDICATION  Take 4 tablets by mouth daily. Standard Process- Cardio Plus   . OVER THE COUNTER MEDICATION Take 1 tablet by mouth daily. Cardio Platinum - Vitamin K-2 500 mg, Arginine 300 mg, Natokinase 100 mg  . OVER THE COUNTER MEDICATION Take by mouth daily. Hemp oil  . Phosphatidylserine 100 MG CAPS Take by mouth daily.  . Probiotic Product (PROBIOTIC ACIDOPHILUS) CAPS Take 1 capsule by mouth daily.  . RESTASIS 0.05 % ophthalmic emulsion Place 1 drop into both eyes 2 (two) times daily.  . S-Adenosylmethionine (SAM-E PO) Take by mouth.  . testosterone cypionate (DEPO-TESTOSTERONE) 100 MG/ML injection Inject 1 mL (100 mg total) into the muscle every 14 (fourteen) days. For IM use only  . TURMERIC PO Take by mouth.  . vitamin B-12 (CYANOCOBALAMIN) 500 MCG tablet Take 500 mcg by mouth daily.    No facility-administered encounter medications on file as of 03/27/2019.    Activities of Daily Living In your present state of health, do you have any difficulty performing the following activities: 03/27/2019  Hearing? Y  Comment Hearing aid  Vision? N  Difficulty concentrating or making decisions? N  Walking or climbing stairs? N  Dressing or bathing? N  Doing errands, shopping? Y  Preparing Food and eating ? N  Using the Toilet? N  In the past six months, have you accidently leaked urine? N  Do you have problems with loss of bowel  control? N  Managing your Medications? N  Managing your Finances? N  Housekeeping or managing your Housekeeping? N  Comment Maid assist 1 times a month  Some recent data might be hidden    Patient Care Team: [name], MD as PCP - General (Internal Medicine) Minna Merritts, MD as PCP - Cardiology (Cardiology)   Assessment:   This is a routine wellness examination for [place].  Nurse connected with patient 03/27/19 at 11:00 AM EST by a telephone enabled telemedicine application and verified that I am speaking with the correct person using two identifiers. Patient stated full name and DOB. Patient gave permission to continue with virtual visit. Patient's location was at home and Nurse's location was at [facility] office.   Patient is alert and oriented x3. Patient denies difficulty focusing or concentrating. Patient likes to read, plays backgammon, instruments and cards for brain stimulation.  MMSE omitted due to recent Neurology exam- score 28/30.  Health Maintenance Due: See completed HM at the end of note.   Eye: Visual acuity not assessed. Virtual visit. Followed by their ophthalmologist.  Dental: Dentures- yes  Hearing: Hearing aids- yes  Safety:  Patient feels safe at home- yes Patient does have smoke detectors at home- yes Patient does wear sunscreen or protective clothing when in direct sunlight - yes Patient does wear seat belt when in a moving vehicle - yes Patient drives- yes Adequate lighting in walkways free from debris- yes Grab bars and handrails used as appropriate- yes Ambulates with an assistive device- yes; knee brace  Cell phone on person when ambulating outside of the home-   Social: Alcohol intake - yes      Smoking history- never  Smokers in home? none Illicit drug use? none  Medication: Taking as directed and without issues.  Self managed - yes   Covid-19: Precautions and sickness symptoms discussed. Wears mask, social distancing, hand  hygiene as appropriate.   Activities of Daily Living Patient denies needing assistance with: household chores, feeding themselves, getting from bed to chair, getting to the toilet, bathing/showering,  dressing, managing money, or preparing meals.  Assisted by staff with household chores once a month.    Discussed the importance of a healthy diet, water intake and the benefits of aerobic exercise.   Physical activity- walking 3-4 times weekly. Stair climbing 10 times daily.   Diet:  Regular Water: good intake Caffeine: none  Other Providers Patient Care Team: [name], MD as PCP - General (Internal Medicine) Minna Merritts, MD as PCP - Cardiology (Cardiology)  Exercise Activities and Dietary recommendations Current Exercise Habits: Home exercise routine, Type of exercise: walking, Frequency (Times/Week): 4, Intensity: Mild  Goals      Patient Stated   . I would like to ride the stationary bike 3.5 miles 3 times weekly when able (pt-stated)       Fall Risk Fall Risk  03/27/2019 02/04/2019 05/29/2017 04/14/2016 03/30/2015  Falls in the past year? 0 0 No Yes No  Comment - Emmi Telephone Survey: data to providers prior to load - - -  Number falls in past yr: - - - 1 -  Injury with Fall? - - - No -  Follow up Falls evaluation completed - - Falls prevention discussed -   Timed Get Up and Go Performed: no, virtual visit  Depression Screen PHQ 2/9 Scores 09/24/2018 05/29/2017 04/14/2016 03/30/2015  PHQ - 2 Score 0 0 0 0  PHQ- 9 Score 0 - - -    Cognitive Function MMSE - Mini Mental State Exam 03/30/2015  Orientation to time 5  Orientation to Place 5  Registration 3  Attention/ Calculation 5  Recall 3  Language- name 2 objects 2  Language- repeat 1  Language- follow 3 step command 3  Language- read & follow direction 1  Write a sentence 0  Write a sentence-comments Some difficulty. Tremors.  Copy design 1  Total score 29     6CIT Screen 05/29/2017 04/14/2016  What  Year? 0 points 0 points  What month? 0 points 0 points  What time? 0 points 0 points  Count back from 20 0 points 0 points  Months in reverse 0 points 0 points  Repeat phrase 0 points 0 points  Total Score 0 0    Immunization History  Administered Date(s) Administered  . Fluad Quad(high Dose 65+) 12/13/2018  . Influenza,inj,Quad PF,6+ Mos 01/14/2014, 01/25/2018  . Influenza-Unspecified 12/11/2012  . Pneumococcal Conjugate-13 01/14/2014  . Pneumococcal Polysaccharide-23 04/28/2012  . Tdap 08/19/2014  . Zoster 04/28/2010  . Zoster Recombinat (Shingrix) 03/12/2017   Screening Tests Health Maintenance  Topic Date Due  . TETANUS/TDAP  08/18/2024  . INFLUENZA VACCINE  Completed  . PNA vac Low Risk Adult  Completed       Plan:   Keep all routine maintenance appointments.   Medicare Attestation I have personally reviewed: The patient's medical and social history Their use of alcohol, tobacco or illicit drugs Their current medications and supplements The patient's functional ability including ADLs,fall risks, home safety risks, cognitive, and hearing and visual impairment Diet and physical activities Evidence for depression   I have reviewed and discussed with patient certain preventive protocols, quality metrics, and best practice recommendations.     Varney Biles, LPN  9/73/[id number]

## 2019-04-03 DIAGNOSIS — H31001 Unspecified chorioretinal scars, right eye: Secondary | ICD-10-CM | POA: Diagnosis not present

## 2019-04-03 DIAGNOSIS — H04123 Dry eye syndrome of bilateral lacrimal glands: Secondary | ICD-10-CM | POA: Diagnosis not present

## 2019-04-03 DIAGNOSIS — H52223 Regular astigmatism, bilateral: Secondary | ICD-10-CM | POA: Diagnosis not present

## 2019-04-07 ENCOUNTER — Other Ambulatory Visit: Payer: Self-pay

## 2019-04-07 ENCOUNTER — Ambulatory Visit (INDEPENDENT_AMBULATORY_CARE_PROVIDER_SITE_OTHER): Payer: PPO | Admitting: Vascular Surgery

## 2019-04-07 ENCOUNTER — Encounter (INDEPENDENT_AMBULATORY_CARE_PROVIDER_SITE_OTHER): Payer: Self-pay | Admitting: Vascular Surgery

## 2019-04-07 ENCOUNTER — Ambulatory Visit (INDEPENDENT_AMBULATORY_CARE_PROVIDER_SITE_OTHER): Payer: PPO

## 2019-04-07 VITALS — BP 133/75 | HR 51 | Resp 15 | Wt 178.4 lb

## 2019-04-07 DIAGNOSIS — I739 Peripheral vascular disease, unspecified: Secondary | ICD-10-CM | POA: Diagnosis not present

## 2019-04-07 DIAGNOSIS — I25119 Atherosclerotic heart disease of native coronary artery with unspecified angina pectoris: Secondary | ICD-10-CM | POA: Diagnosis not present

## 2019-04-07 DIAGNOSIS — J449 Chronic obstructive pulmonary disease, unspecified: Secondary | ICD-10-CM

## 2019-04-07 DIAGNOSIS — E785 Hyperlipidemia, unspecified: Secondary | ICD-10-CM | POA: Diagnosis not present

## 2019-04-07 NOTE — Progress Notes (Signed)
MRN : [id number]  Theodore [name] is a 86 y.o. (06/26/1929) male who presents with chief complaint of No chief complaint on file. [name]  History of Present Illness:  The patient returns to the office for followup and review of the noninvasive studies. There have been no interval changes in lower extremity symptoms. He continues to describe a weakness and "shuffling of my feet".  No interval development of rest pain symptoms. No new ulcers or wounds have occurred since the last visit.  There have been no significant changes to the patient's overall health care.  The patient denies amaurosis fugax or recent TIA symptoms. There are no recent neurological changes noted. The patient denies history of DVT, PE or superficial thrombophlebitis. The patient denies recent episodes of angina or shortness of breath.   ABI Rt=0.90 and Lt=0.73  (previous ABI Rt=0.68 and Lt=0.79) Previous duplex ultrasound of the  Bilateral lower extremities arterial system shows normal femoral popliteal arteries with mixed tibial disease  No outpatient medications have been marked as taking for the 04/07/19 encounter (Appointment) with Delana Meyer, Dolores Lory, MD.    Past Medical History:  Diagnosis Date  . Arthritis    knees  . Cancer [facility]) 2002   melanoma right ear  . Colon polyp   . COPD (chronic obstructive pulmonary disease) ([place])    pt said he believes it is a misdiagnosis   . Depression   . Dysrhythmia    PVCs  . History of kidney stones   . Hypothyroidism   . Rhinitis   . Thyroid disease    hypothyroid  . Tremors of nervous system    Benign  . Wears dentures    partial upper and lower  . Wears hearing aid in both ears     Past Surgical History:  Procedure Laterality Date  . ADENOIDECTOMY    . CATARACT EXTRACTION W/PHACO Left 10/22/2017   Procedure: CATARACT EXTRACTION PHACO AND INTRAOCULAR LENS PLACEMENT ([place])  LEFT;  Surgeon: Eulogio Bear, MD;  Location: [place];  Service:  Ophthalmology;  Laterality: Left;  . CATARACT EXTRACTION W/PHACO Right 11/20/2017   Procedure: CATARACT EXTRACTION PHACO AND INTRAOCULAR LENS PLACEMENT ([place]) RIGHT;  Surgeon: Eulogio Bear, MD;  Location: [place];  Service: Ophthalmology;  Laterality: Right;  . ESOPHAGOGASTRODUODENOSCOPY (EGD) WITH PROPOFOL N/A 10/31/2016   Procedure: ESOPHAGOGASTRODUODENOSCOPY (EGD) WITH PROPOFOL;  Surgeon: Lollie Sails, MD;  Location: [facility] ENDOSCOPY;  Service: Endoscopy;  Laterality: N/A;  . JOINT REPLACEMENT Right 2003   knee  . knee meniscus repair Right   . MELANOMA EXCISION Right    ear. Followed by Dr. Nehemiah Massed  . PATELLECTOMY Bilateral   . TONSILLECTOMY      Social History Social History   Tobacco Use  . Smoking status: Never Smoker  . Smokeless tobacco: Never Used  Substance Use Topics  . Alcohol use: Yes    Comment: 1 glass of wine daily  . Drug use: No    Family History Family History  Problem Relation Age of Onset  . Hypertension Mother   . Heart disease Mother        CHF  . Heart disease Father   . Cancer Sister        melanoma    Allergies  Allergen Reactions  . Penicillins Rash     REVIEW OF SYSTEMS (Negative unless checked)  Constitutional: [] Weight loss  [] Fever  [] Chills Cardiac: [] Chest pain   [] Chest pressure   [] Palpitations   [] Shortness  of breath when laying flat   [] Shortness of breath with exertion. Vascular:  [x] Pain in legs with walking   [x] Pain in legs at rest  [] History of DVT   [] Phlebitis   [x] Swelling in legs   [] Varicose veins   [] Non-healing ulcers Pulmonary:   [] Uses home oxygen   [] Productive cough   [] Hemoptysis   [] Wheeze  [] COPD   [] Asthma Neurologic:  [] Dizziness   [] Seizures   [] History of stroke   [] History of TIA  [] Aphasia   [] Vissual changes   [] Weakness or numbness in arm   [x] Weakness or numbness in leg Musculoskeletal:   [] Joint swelling   [x] Joint pain   [] Low back pain Hematologic:  [] Easy bruising  [] Easy  bleeding   [] Hypercoagulable state   [] Anemic Gastrointestinal:  [] Diarrhea   [] Vomiting  [] Gastroesophageal reflux/heartburn   [] Difficulty swallowing. Genitourinary:  [] Chronic kidney disease   [] Difficult urination  [] Frequent urination   [] Blood in urine Skin:  [] Rashes   [] Ulcers  Psychological:  [] History of anxiety   []  History of major depression.  Physical Examination  There were no vitals filed for this visit. There is no height or weight on file to calculate BMI. Gen: WD/WN, NAD Head: [place]/AT, No temporalis wasting.  Ear/Nose/Throat: Hearing grossly intact, nares w/o erythema or drainage Eyes: PER, EOMI, sclera nonicteric.  Neck: Supple, no large masses.   Pulmonary:  Good air movement, no audible wheezing bilaterally, no use of accessory muscles.  Cardiac: RRR, no JVD Vascular: scattered varicosities present bilaterally.  Mild venous stasis changes to the legs bilaterally.  2+ soft pitting edema Vessel Right Left  Radial Palpable Palpable  PT Not Palpable Not Palpable  DP Not Palpable Not Palpable  Gastrointestinal: Non-distended. No guarding/no peritoneal signs.  Musculoskeletal: M/S 5/5 throughout.  No deformity or atrophy.  Neurologic: CN 2-12 intact. Symmetrical.  Speech is fluent. Motor exam as listed above. Psychiatric: Judgment intact, Mood & affect appropriate for pt's clinical situation. Dermatologic: No rashes or ulcers noted.  No changes consistent with cellulitis. Lymph : No lichenification or skin changes of chronic lymphedema.  CBC Lab Results  Component Value Date   WBC 7.1 08/19/2018   HGB 16.9 08/19/2018   HCT 48.8 08/19/2018   MCV 95.8 08/19/2018   PLT 246.0 08/19/2018    BMET    Component Value Date/Time   NA 140 08/19/2018 0839   K 4.2 08/19/2018 0839   CL 105 08/19/2018 0839   CO2 29 08/19/2018 0839   GLUCOSE 91 08/19/2018 0839   BUN 25 (H) 08/19/2018 0839   CREATININE 1.21 08/19/2018 0839   CREATININE 0.92 02/03/2015 1504   CALCIUM 8.8  08/19/2018 0839   GFRNONAA >60 05/10/2016 0236   GFRNONAA 77 02/03/2015 1504   GFRAA >60 05/10/2016 0236   GFRAA 89 02/03/2015 1504   CrCl cannot be calculated (Patient's most recent lab result is older than the maximum 21 days allowed.).  COAG No results found for: INR, PROTIME  Radiology No results found.    Assessment/Plan 1. PAD (peripheral artery disease) (HCC)  Recommend:  The patient has evidence of atherosclerosis of the lower extremities with claudication.  The patient does not voice lifestyle limiting changes at this point in time.  Noninvasive studies do not suggest clinically significant change.  No invasive studies, angiography or surgery at this time The patient should continue walking and begin a more formal exercise program.  The patient should continue antiplatelet therapy and aggressive treatment of the lipid abnormalities  No changes in  the patient's medications at this time  The patient should continue wearing graduated compression socks 10-15 mmHg strength to control the mild edema.   - VAS [place] ABI WITH/WO TBI; Future  2. Coronary artery disease involving native coronary artery of native heart with angina pectoris ([place]) Continue cardiac and antihypertensive medications as already ordered and reviewed, no changes at this time.  Continue statin as ordered and reviewed, no changes at this time  Nitrates PRN for chest pain   3. Hyperlipidemia LDL goal <100 Continue statin as ordered and reviewed, no changes at this time   4. Chronic obstructive pulmonary disease, unspecified COPD type ([place]) Continue pulmonary medications and aerosols as already ordered, these medications have been reviewed and there are no changes at this time.     Hortencia Pilar, MD  04/07/2019 8:27 AM

## 2019-04-08 ENCOUNTER — Encounter (INDEPENDENT_AMBULATORY_CARE_PROVIDER_SITE_OTHER): Payer: Self-pay | Admitting: Vascular Surgery

## 2019-04-21 DIAGNOSIS — L821 Other seborrheic keratosis: Secondary | ICD-10-CM | POA: Diagnosis not present

## 2019-04-21 DIAGNOSIS — D1801 Hemangioma of skin and subcutaneous tissue: Secondary | ICD-10-CM | POA: Diagnosis not present

## 2019-04-21 DIAGNOSIS — L578 Other skin changes due to chronic exposure to nonionizing radiation: Secondary | ICD-10-CM | POA: Diagnosis not present

## 2019-04-21 DIAGNOSIS — L820 Inflamed seborrheic keratosis: Secondary | ICD-10-CM | POA: Diagnosis not present

## 2019-04-21 DIAGNOSIS — D1722 Benign lipomatous neoplasm of skin and subcutaneous tissue of left arm: Secondary | ICD-10-CM | POA: Diagnosis not present

## 2019-04-29 ENCOUNTER — Other Ambulatory Visit: Payer: Self-pay | Admitting: Internal Medicine

## 2019-04-29 NOTE — Telephone Encounter (Signed)
Refilled: 09/02/2018 Last OV: 09/23/2018 Next OV: not scheduled

## 2019-05-09 DIAGNOSIS — M1712 Unilateral primary osteoarthritis, left knee: Secondary | ICD-10-CM | POA: Diagnosis not present

## 2019-05-13 ENCOUNTER — Other Ambulatory Visit: Payer: Self-pay | Admitting: Internal Medicine

## 2019-05-14 ENCOUNTER — Telehealth: Payer: Self-pay | Admitting: Internal Medicine

## 2019-05-14 DIAGNOSIS — R4189 Other symptoms and signs involving cognitive functions and awareness: Secondary | ICD-10-CM | POA: Diagnosis not present

## 2019-05-14 DIAGNOSIS — G250 Essential tremor: Secondary | ICD-10-CM | POA: Diagnosis not present

## 2019-05-14 DIAGNOSIS — Z719 Counseling, unspecified: Secondary | ICD-10-CM | POA: Diagnosis not present

## 2019-05-14 MED ORDER — AZELASTINE HCL 0.1 % NA SOLN: 1.0000 | Freq: Two times a day (BID) | NASAL | 1 refills | Status: DC

## 2019-05-14 NOTE — Telephone Encounter (Signed)
Spoke with Olivia Mackie at [employer] to let her know that it is okay to switch the manufacturer. Called the pt to let him know. Pt stated that he has a bunch of the manufacturer that he is already taking so I advised pt to give [place] a call when he starts the new bottle to schedule a lab appt 6 weeks after starting. Pt gave a verbal understanding.   Pt stated that he saw his neurologist this week and he is going to have a procedure preformed soon. He stated that the neurologist told him that he needs to have his drainage cleared up as much as possible and he recommended the pt try using Azelistine nasal spray. Pt is wondering if we could send in a prescription for it.

## 2019-05-14 NOTE — Telephone Encounter (Signed)
Spoke with pt to let him know that the medication has been sent in.

## 2019-05-14 NOTE — Telephone Encounter (Signed)
Azelastine nasal spray [facility]

## 2019-05-14 NOTE — Telephone Encounter (Signed)
Hope with [facility] called to state that the manufacturer changed for levothyroxine (SYNTHROID) 100 MCG tablet. It was [place]oz and now it is [place]. Is this okay with PCP? Please call back at [phone]. Ref # [id number]

## 2019-05-19 ENCOUNTER — Telehealth: Payer: Self-pay | Admitting: Internal Medicine

## 2019-05-19 NOTE — Telephone Encounter (Signed)
Theodore [name] with [facility] called and states that pt is doing "excellent". Vital signs BP 135/69 HR-51 Temp-96.6

## 2019-05-19 NOTE — Telephone Encounter (Signed)
FYI

## 2019-05-28 DIAGNOSIS — G250 Essential tremor: Secondary | ICD-10-CM | POA: Insufficient documentation

## 2019-06-03 ENCOUNTER — Other Ambulatory Visit: Payer: Self-pay | Admitting: Neurology

## 2019-06-03 ENCOUNTER — Other Ambulatory Visit (HOSPITAL_COMMUNITY): Payer: Self-pay | Admitting: Neurology

## 2019-06-03 DIAGNOSIS — G250 Essential tremor: Secondary | ICD-10-CM

## 2019-06-05 ENCOUNTER — Other Ambulatory Visit: Payer: Self-pay | Admitting: Internal Medicine

## 2019-06-05 DIAGNOSIS — G5620 Lesion of ulnar nerve, unspecified upper limb: Secondary | ICD-10-CM | POA: Insufficient documentation

## 2019-06-05 DIAGNOSIS — G5622 Lesion of ulnar nerve, left upper limb: Secondary | ICD-10-CM | POA: Diagnosis not present

## 2019-06-05 DIAGNOSIS — M79641 Pain in right hand: Secondary | ICD-10-CM | POA: Insufficient documentation

## 2019-06-05 DIAGNOSIS — G5621 Lesion of ulnar nerve, right upper limb: Secondary | ICD-10-CM | POA: Diagnosis not present

## 2019-06-13 ENCOUNTER — Other Ambulatory Visit: Payer: Self-pay

## 2019-06-13 ENCOUNTER — Ambulatory Visit
Admission: RE | Admit: 2019-06-13 | Discharge: 2019-06-13 | Disposition: A | Discharge status: Home / Self Care | Payer: PPO | Source: Ambulatory Visit | Attending: Neurology | Admitting: Neurology

## 2019-06-13 DIAGNOSIS — G250 Essential tremor: Secondary | ICD-10-CM | POA: Diagnosis not present

## 2019-06-13 DIAGNOSIS — R251 Tremor, unspecified: Secondary | ICD-10-CM | POA: Diagnosis not present

## 2019-06-30 DIAGNOSIS — G5603 Carpal tunnel syndrome, bilateral upper limbs: Secondary | ICD-10-CM | POA: Diagnosis not present

## 2019-06-30 DIAGNOSIS — G5623 Lesion of ulnar nerve, bilateral upper limbs: Secondary | ICD-10-CM | POA: Diagnosis not present

## 2019-06-30 DIAGNOSIS — R4189 Other symptoms and signs involving cognitive functions and awareness: Secondary | ICD-10-CM | POA: Diagnosis not present

## 2019-06-30 DIAGNOSIS — G250 Essential tremor: Secondary | ICD-10-CM | POA: Diagnosis not present

## 2019-06-30 DIAGNOSIS — I25119 Atherosclerotic heart disease of native coronary artery with unspecified angina pectoris: Secondary | ICD-10-CM | POA: Diagnosis not present

## 2019-07-10 DIAGNOSIS — G5603 Carpal tunnel syndrome, bilateral upper limbs: Secondary | ICD-10-CM | POA: Diagnosis not present

## 2019-07-10 DIAGNOSIS — G5622 Lesion of ulnar nerve, left upper limb: Secondary | ICD-10-CM | POA: Diagnosis not present

## 2019-07-10 DIAGNOSIS — G5621 Lesion of ulnar nerve, right upper limb: Secondary | ICD-10-CM | POA: Diagnosis not present

## 2019-07-10 DIAGNOSIS — G5601 Carpal tunnel syndrome, right upper limb: Secondary | ICD-10-CM | POA: Insufficient documentation

## 2019-07-17 ENCOUNTER — Other Ambulatory Visit: Payer: Self-pay

## 2019-07-17 ENCOUNTER — Encounter: Payer: Self-pay | Admitting: Podiatry

## 2019-07-17 ENCOUNTER — Ambulatory Visit: Payer: PPO | Admitting: Podiatry

## 2019-07-17 VITALS — Temp 97.3°F

## 2019-07-17 DIAGNOSIS — Q828 Other specified congenital malformations of skin: Secondary | ICD-10-CM | POA: Diagnosis not present

## 2019-07-17 DIAGNOSIS — M2041 Other hammer toe(s) (acquired), right foot: Secondary | ICD-10-CM

## 2019-07-17 DIAGNOSIS — M216X1 Other acquired deformities of right foot: Secondary | ICD-10-CM

## 2019-07-17 NOTE — Progress Notes (Signed)
This patient returns to my office for at risk foot care.  This patient requires this care by a professional since this patient will be at risk due to having PAD   He has painful callus right forefoot.   Patient requests trimming of his nails since he has cut his left big toe doing it himself. This patient is unable to cut nails himself since the patient cannot reach his nails.These nails are painful walking and wearing shoes.  This patient presents for at risk foot care today.  General Appearance  Alert, conversant and in no acute stress.  Vascular  Dorsalis pedis and posterior tibial  pulses are palpable  bilaterally.  Capillary return is within normal limits  bilaterally. Temperature is within normal limits  bilaterally.  Neurologic  Senn-Weinstein monofilament wire test within normal limits  bilaterally. Muscle power within normal limits bilaterally.  Nails Thick disfigured discolored nails with subungual debris  from hallux to fifth toes bilaterally. No evidence of bacterial infection or drainage bilaterally.  Orthopedic  No limitations of motion  feet .  No crepitus or effusions noted.  No bony pathology or digital deformities noted  .HAV  B/L.  Hammer toes 2-5  B/L.  Plantar flexed second metatarsal right foot.  Skin  normotropic skin with no porokeratosis noted bilaterally.  No signs of infections or ulcers noted.     Onychomycosis  Pain in right toes  Pain in left toes  Porokeratosis sub 2 right foot.  Consent was obtained for treatment procedures.   Mechanical debridement of nails 1-5  bilaterally performed with a nail nipper.  Filed with dremel without incident. Debridement of porokeratosis with # 15 blade.   Return office visit   prn                  Told patient to return for periodic foot care and evaluation due to potential at risk complications.   Gardiner Barefoot DPM

## 2019-07-18 ENCOUNTER — Telehealth: Payer: Self-pay | Admitting: Internal Medicine

## 2019-07-18 MED ORDER — LEVOTHYROXINE SODIUM 100 MCG PO TABS: 100.0000 ug | ORAL_TABLET | Freq: Every day | ORAL | 0 refills | Status: DC

## 2019-07-18 NOTE — Telephone Encounter (Signed)
[facility]SYNTHROID) 100 MCG tablet

## 2019-07-18 NOTE — Telephone Encounter (Signed)
Medication has been refilled.

## 2019-07-21 DIAGNOSIS — E039 Hypothyroidism, unspecified: Secondary | ICD-10-CM | POA: Diagnosis not present

## 2019-07-21 DIAGNOSIS — G250 Essential tremor: Secondary | ICD-10-CM | POA: Diagnosis not present

## 2019-07-21 DIAGNOSIS — Z79899 Other long term (current) drug therapy: Secondary | ICD-10-CM | POA: Diagnosis not present

## 2019-08-01 DIAGNOSIS — G250 Essential tremor: Secondary | ICD-10-CM | POA: Diagnosis not present

## 2019-08-06 ENCOUNTER — Other Ambulatory Visit: Payer: Self-pay | Admitting: Internal Medicine

## 2019-08-07 DIAGNOSIS — G5601 Carpal tunnel syndrome, right upper limb: Secondary | ICD-10-CM | POA: Diagnosis not present

## 2019-08-07 DIAGNOSIS — M65332 Trigger finger, left middle finger: Secondary | ICD-10-CM | POA: Diagnosis not present

## 2019-08-13 ENCOUNTER — Ambulatory Visit: Payer: PPO | Admitting: Physician Assistant

## 2019-08-14 ENCOUNTER — Other Ambulatory Visit: Payer: Self-pay | Admitting: Internal Medicine

## 2019-08-14 ENCOUNTER — Other Ambulatory Visit: Payer: Self-pay

## 2019-08-14 ENCOUNTER — Encounter: Payer: Self-pay | Admitting: Nurse Practitioner

## 2019-08-14 ENCOUNTER — Ambulatory Visit: Payer: PPO | Admitting: Nurse Practitioner

## 2019-08-14 VITALS — BP 130/54 | HR 55 | Ht 75.0 in | Wt 183.0 lb

## 2019-08-14 DIAGNOSIS — R002 Palpitations: Secondary | ICD-10-CM | POA: Diagnosis not present

## 2019-08-14 DIAGNOSIS — R5383 Other fatigue: Secondary | ICD-10-CM

## 2019-08-14 DIAGNOSIS — R0600 Dyspnea, unspecified: Secondary | ICD-10-CM | POA: Diagnosis not present

## 2019-08-14 DIAGNOSIS — E782 Mixed hyperlipidemia: Secondary | ICD-10-CM | POA: Diagnosis not present

## 2019-08-14 DIAGNOSIS — R0609 Other forms of dyspnea: Secondary | ICD-10-CM

## 2019-08-14 MED ORDER — TESTOSTERONE CYPIONATE 200 MG/ML IM SOLN: INTRAMUSCULAR | 5 refills | Status: DC

## 2019-08-14 NOTE — Progress Notes (Signed)
Office Visit    Patient Name: Theodore [name] Date of Encounter: 08/14/2019  Primary Care Provider:  [name], MD Primary Cardiologist:  Ida Rogue, MD  Chief Complaint    86 year old male with a history of asymptomatic bradycardia, CAD, depression, COPD, PAD, and PVCs, who presents for follow-up related to fatigue and DOE.  Past Medical History    Past Medical History:  Diagnosis Date  . Arthritis    knees  . Asymptomatic Sinus bradycardia    a. 05/2016 Zio: Avg HR 61 (41-167).  . Benign essential tremor   . Cancer [facility]) 2002   melanoma right ear  . Colon polyp   . COPD (chronic obstructive pulmonary disease) ([place])    pt said he believes it is a misdiagnosis   . Coronary artery calcification seen on CT scan    a. 06/2016 CTA Chest: cor Ca2+; b. 05/2017 MV: EF >65%. No ischemia/infarct.  . Depression   . History of echocardiogram    a. 04/2016 Echo: EF 60-65%, mild conc LVH. Nl PASP.  [name] History of kidney stones   . Hypothyroidism   . Hypothyroidism   . Palpitations    a. 05/2016 Zio: Avg HR 61 (41-167). 9 SVT runs (fastest 167 - 5 beats; longest 8 beats - 101 bpm). Rare PACs/PVCs.  . PVC's (premature ventricular contractions)   . Rhinitis   . Wears dentures    partial upper and lower  . Wears hearing aid in both ears    Past Surgical History:  Procedure Laterality Date  . ADENOIDECTOMY    . CATARACT EXTRACTION W/PHACO Left 10/22/2017   Procedure: CATARACT EXTRACTION PHACO AND INTRAOCULAR LENS PLACEMENT ([place])  LEFT;  Surgeon: Eulogio Bear, MD;  Location: [place];  Service: Ophthalmology;  Laterality: Left;  . CATARACT EXTRACTION W/PHACO Right 11/20/2017   Procedure: CATARACT EXTRACTION PHACO AND INTRAOCULAR LENS PLACEMENT ([place]) RIGHT;  Surgeon: Eulogio Bear, MD;  Location: [place];  Service: Ophthalmology;  Laterality: Right;  . ESOPHAGOGASTRODUODENOSCOPY (EGD) WITH PROPOFOL N/A 10/31/2016   Procedure:  ESOPHAGOGASTRODUODENOSCOPY (EGD) WITH PROPOFOL;  Surgeon: Lollie Sails, MD;  Location: [facility] END[facility]COPY;  Service: Endoscopy;  Laterality: N/A;  . JOINT REPLACEMENT Right 2003   knee  . knee meniscus repair Right   . MELANOMA EXCISION Right    ear. Followed by Dr. Nehemiah Massed  . PATELLECTOMY Bilateral   . TONSILLECTOMY      Allergies  Allergies  Allergen Reactions  . Penicillins Rash and Other (See Comments)    History of Present Illness    86 year old male with the above past medical history including asymptomatic bradycardia, CAD (on CT), depression, COPD, PAD, and PVCs.  Previous chest CT showed three-vessel coronary disease with subsequent stress testing in March 2018 showing no evidence of ischemia or infarct.  Echocardiogram in February 2018 showed an EF of 60% with mild to moderate LVH.  There were no significant valvular abnormalities.  He has been conservatively managed with Zetia (declined statin).  He was last seen in cardiology clinic in early January of this year, at which time he was doing relatively well.  In early May, he underwent MRI guided focused ultrasound thalamotomy on the left for treatment of essential tremor of the right arm.  He was told that he may experience fatigue and some weakness on the right side following this procedure.  He has since noted fatigue and also dyspnea on exertion, which is new for him.  Because of both  of those symptoms, he has been less likely to do some of the things he enjoys, such as racing his radial controlled sailboat.  He notes that prior to his procedure, he started experiencing a vibration-like symptom over his left lower pectoral muscle, occurring several times a day, lasting just a few seconds, and resolving spontaneously.  This is most likely to occur when he is lying down in the afternoon.  He says it is very superficial sensation and at times he feels as though he can feel the vibration with his hand.  He does not think that this  is in any way associated with heart palpitations.  The vibration does not hurt, it has just been noticeable, though this week, he has barely noticed it at all.  He denies chest pain, palpitations, PND, orthopnea, dizziness, syncope, edema, or early satiety.  Home Medications    Prior to Admission medications   Medication Sig Start Date End Date Taking? Authorizing Provider  albuterol (VENTOLIN HFA) 108 (90 Base) MCG/ACT inhaler Inhale 2 puffs into the lungs every 4 (four) hours as needed for wheezing or shortness of breath. 02/12/19   Flora Lipps, MD  Alpha-Lipoic Acid 600 MG CAPS Take by mouth.    [provider]  ascorbic Acid (VITAMIN C) 500 MG CPCR Take by mouth.    [provider]  azelastine (ASTELIN) 0.1 % nasal spray Place 1 spray into both nostrils 2 (two) times daily. Use in each nostril as directed 05/14/19   [name], MD  b complex vitamins tablet Take by mouth.    [provider]  buPROPion (WELLBUTRIN XL) 150 MG 24 hr tablet Take by mouth.    [provider]  buPROPion HBr (APLENZIN PO) bupropion HBr    [provider]  Cholecalciferol 125 MCG (5000 UT) TABS Take by mouth.    [provider]  COD LIVER OIL PO Take by mouth.    [provider]  Coenzyme Q10 100 MG capsule Take by mouth.    [provider]  cycloSPORINE (RESTASIS) 0.05 % ophthalmic emulsion Place 1 drop into both eyes 2 (two) times daily.    [provider]  donepezil (ARICEPT) 5 MG tablet Take by mouth. 07/03/19   [provider]  ezetimibe (ZETIA) 10 MG tablet Take 1 tablet (10 mg total) by mouth daily. 03/20/19   Loel Dubonnet, NP  famotidine (PEPCID) 20 MG tablet Take by mouth. 07/17/19 07/24/19  [provider]  Flaxseed, Linseed, (FLAX SEEDS PO) Take by mouth daily. With omega 3    [provider]  levothyroxine (SYNTHROID) 100 MCG tablet Take 1 tablet (100 mcg total) by mouth daily before breakfast.  07/18/19   [name], MD  LUTEIN PO Take by mouth.    [provider]  magnesium oxide (MAG-OX) 400 MG tablet Take 400 mg by mouth daily.    [provider]  meloxicam (MOBIC) 7.5 MG tablet Take by mouth. 05/09/19   [provider]  OVER THE COUNTER MEDICATION Take 1,000 mg by mouth daily. Concentrated raw butter oil    [provider]  OVER THE COUNTER MEDICATION Take 4 tablets by mouth daily. Standard Process- Cardio Plus     [provider]  OVER THE COUNTER MEDICATION Take 1 tablet by mouth daily. Cardio Platinum - Vitamin K-2 500 mg, Arginine 300 mg, Natokinase 100 mg    [provider]  OVER THE COUNTER MEDICATION Take by mouth daily. Hemp oil  [provider]  Phosphatidylserine 100 MG CAPS Take by mouth daily.    [provider]  Probiotic Product (PROBIOTIC ACIDOPHILUS) CAPS Take 1 capsule by mouth daily.    [provider]  S-Adenosylmethionine 400 MG TABS Take by mouth.    [provider]  testosterone cypionate (DEPOTESTOSTERONE CYPIONATE) 200 MG/ML injection INJECT 0.5 ML (100 MG TOTAL) INTO THE MUSCLE EVERY 14 (FOURTEEN) DAYS. 08/06/19   [name], MD  Turmeric 400 MG CAPS Take by mouth.    [provider]  UNABLE TO FIND Take by mouth.    [provider]  vitamin B-12 (CYANOCOBALAMIN) 500 MCG tablet Take 500 mcg by mouth daily.     [provider]    Review of Systems    He has been having fatigue and dyspnea on exertion.  He has also been noticing a superficial left pectoral vibration over the past month.  He denies chest pain, palpitations, PND, orthopnea, dizziness, syncope, edema, or early satiety.  All other systems reviewed and are otherwise negative except as noted above.  Physical Exam    VS:  BP (!) 130/54 (BP Location: Left Arm, Patient Position: Sitting, Cuff Size: Normal)   Pulse (!) 55   Ht 6\' 3"  (1.905 m)   Wt 183 lb (83 kg)   SpO2 98%    BMI 22.87 kg/m  , BMI Body mass index is 22.87 kg/m. GEN: Well nourished, well developed, in no acute distress. HEENT: normal. Neck: Supple, no JVD, carotid bruits, or masses. Cardiac: RRR, no murmurs, rubs, or gallops. No clubbing, cyanosis, edema.  Radials/DP/PT 1+ and equal bilaterally.  Respiratory:  Respirations regular and unlabored, clear to auscultation bilaterally. GI: Soft, nontender, nondistended, BS + x 4. MS: no deformity or atrophy. Skin: warm and dry, no rash. Neuro:  Strength and sensation are intact. Psych: Normal affect.  Accessory Clinical Findings    ECG personally reviewed by me today -sinus bradycardia, 55, PACs- no acute changes.  Lab Results  Component Value Date   WBC 7.1 08/19/2018   HGB 16.9 08/19/2018   HCT 48.8 08/19/2018   MCV 95.8 08/19/2018   PLT 246.0 08/19/2018   Lab Results  Component Value Date   CREATININE 1.21 08/19/2018   BUN 25 (H) 08/19/2018   NA 140 08/19/2018   K 4.2 08/19/2018   CL 105 08/19/2018   CO2 29 08/19/2018   Lab Results  Component Value Date   ALT 15 08/19/2018   AST 17 08/19/2018   ALKPHOS 41 08/19/2018   BILITOT 1.0 08/19/2018   Lab Results  Component Value Date   CHOL 166 08/19/2018   HDL 57.40 08/19/2018   LDLCALC 97 08/19/2018   TRIG 59.0 08/19/2018   CHOLHDL 3 08/19/2018    Lab Results  Component Value Date   HGBA1C 4.7 05/28/2018    Assessment & Plan    1.  Dyspnea on exertion: Patient notes that over the past month, he has been experiencing fatigue and dyspnea on exertion which is unusual for him.  He did undergo MRI guided focused ultrasound thalamotomy in early May in the setting of benign essential tremor on the right and was advised that he may experience fatigue after that procedure.  The dyspnea is concerning however.  His ECG and exam are unremarkable today.  I will arrange for an echocardiogram.  We also discussed obtaining lab work but he notes that he is due for his annual follow-up with  Dr. Derrel Nip and he will  reach out to her office to arrange for labs as he also usually has testosterone testing and PSA in addition to routine blood work (CBC, complete metabolic panel, lipids, TSH).  2.  Fatigue: See #1.  This has been present since his neurologic procedure, and he was advised that this may occur as a side effect.  As noted above, he has also been having dyspnea on exertion which was not expected.  He is due for lab work and plans to reach out to primary care to have this performed through that office.  3.  Left chest vibration: Over the past month, he has noted intermittent superficial vibrations over his left lower pectoral muscle which lasts just a few seconds and resolve spontaneously.  These typically occur when he is lying down after feeling fatigued.  He does not think that these are related to palpitations as it is a very superficial feeling.  We discussed that this might represent a muscle spasm.  As noted above, he will arrange for follow-up labs through primary care.  I advised him to try and pay attention to heart rhythm during these episodes as there could be a role for event monitoring.  He does have a known history of rare PACs, PVCs, and brief episodes of SVT noted on monitoring in 2018.  4.  Palpitations: Patient denies palpitations today.  See #3.  5.  Hyperlipidemia: LDL of 97 last year and Zetia therapy.  He has previously wished to avoid statin therapy.  6.  Coronary calcium on CT: Asymptomatic.  Previous nonischemic stress testing.  He remains on Zetia therapy.  7.  Disposition: Follow-up echocardiogram.  Follow-up in clinic in 6 to 8 weeks.  He plans to follow-up with primary care for his annual physical and lab work in the interim.  Murray Hodgkins, NP 08/14/2019, 10:01 AM

## 2019-08-14 NOTE — Telephone Encounter (Signed)
PT called and stated that his pharmacy refused his prepscrtion for testosterone need new one

## 2019-08-14 NOTE — Patient Instructions (Signed)
Medication Instructions:  Your physician recommends that you continue on your current medications as directed. Please refer to the Current Medication list given to you today.  *If you need a refill on your cardiac medications before your next appointment, please call your pharmacy*   Lab Work: None ordered  If you have labs (blood work) drawn today and your tests are completely normal, you will receive your results only by: [name] MyChart Message (if you have MyChart) OR . A paper copy in the mail If you have any lab test that is abnormal or we need to change your treatment, we will call you to review the results.   Testing/Procedures: 1- Echo  Please return to [facility] on ______________ at _______________ AM/PM for an Echocardiogram. Your physician has requested that you have an echocardiogram. Echocardiography is a painless test that uses sound waves to create images of your heart. It provides your doctor with information about the size and shape of your heart and how well your heart's chambers and valves are working. This procedure takes approximately one hour. There are no restrictions for this procedure. Please note; depending on visual quality an IV may need to be placed.     Follow-Up: At [facility], you and your health needs are our priority.  As part of our continuing mission to provide you with exceptional heart care, we have created designated Provider Care Teams.  These Care Teams include your primary Cardiologist (physician) and Advanced Practice Providers (APPs -  Physician Assistants and Nurse Practitioners) who all work together to provide you with the care you need, when you need it.  We recommend signing up for the patient portal called "MyChart".  Sign up information is provided on this After Visit Summary.  MyChart is used to connect with patients for Virtual Visits (Telemedicine).  Patients are able to view lab/test results, encounter notes, upcoming  appointments, etc.  Non-urgent messages can be sent to your provider as well.   To learn more about what you can do with MyChart, go to [email or web address].    Your next appointment:   6-8 week(s)  The format for your next appointment:   In Person  Provider:    You may see Ida Rogue, MD or Murray Hodgkins, NP

## 2019-08-14 NOTE — Telephone Encounter (Signed)
Pharmacy could not fill pt's rx because it was not sent electronically the last time it was faxed. Pt is needing a new rx sent electronically so he can get medication.

## 2019-08-20 ENCOUNTER — Telehealth: Payer: Self-pay | Admitting: *Deleted

## 2019-08-20 DIAGNOSIS — E034 Atrophy of thyroid (acquired): Secondary | ICD-10-CM

## 2019-08-20 DIAGNOSIS — E785 Hyperlipidemia, unspecified: Secondary | ICD-10-CM

## 2019-08-20 DIAGNOSIS — Z79899 Other long term (current) drug therapy: Secondary | ICD-10-CM

## 2019-08-20 DIAGNOSIS — E559 Vitamin D deficiency, unspecified: Secondary | ICD-10-CM

## 2019-08-20 DIAGNOSIS — E291 Testicular hypofunction: Secondary | ICD-10-CM

## 2019-08-20 NOTE — Telephone Encounter (Signed)
Please place future orders for lab appt.  

## 2019-08-22 ENCOUNTER — Other Ambulatory Visit (INDEPENDENT_AMBULATORY_CARE_PROVIDER_SITE_OTHER): Payer: PPO

## 2019-08-22 ENCOUNTER — Other Ambulatory Visit: Payer: Self-pay

## 2019-08-22 DIAGNOSIS — Z79899 Other long term (current) drug therapy: Secondary | ICD-10-CM

## 2019-08-22 DIAGNOSIS — E559 Vitamin D deficiency, unspecified: Secondary | ICD-10-CM | POA: Diagnosis not present

## 2019-08-22 DIAGNOSIS — E785 Hyperlipidemia, unspecified: Secondary | ICD-10-CM

## 2019-08-22 DIAGNOSIS — E291 Testicular hypofunction: Secondary | ICD-10-CM

## 2019-08-22 DIAGNOSIS — E034 Atrophy of thyroid (acquired): Secondary | ICD-10-CM | POA: Diagnosis not present

## 2019-08-22 LAB — COMPREHENSIVE METABOLIC PANEL
ALT: 18 U/L (ref 0–53)
AST: 22 U/L (ref 0–37)
Albumin: 4.3 g/dL (ref 3.5–5.2)
Alkaline Phosphatase: 45 U/L (ref 39–117)
BUN: 23 mg/dL (ref 6–23)
CO2: 31 mEq/L (ref 19–32)
Calcium: 9.2 mg/dL (ref 8.4–10.5)
Chloride: 104 mEq/L (ref 96–112)
Creatinine, Ser: 1.21 mg/dL (ref 0.40–1.50)
GFR: 56.57 mL/min — ABNORMAL LOW (ref >60.00)
Glucose, Bld: 91 mg/dL (ref 70–99)
Potassium: 4.4 mEq/L (ref 3.5–5.1)
Sodium: 140 mEq/L (ref 135–145)
Total Bilirubin: 1.2 mg/dL (ref 0.2–1.2)
Total Protein: 6.4 g/dL (ref 6.0–8.3)

## 2019-08-22 LAB — CBC WITH DIFFERENTIAL/PLATELET
Basophils Absolute: 0 10*3/uL (ref 0.0–0.1)
Basophils Relative: 0.7 % (ref 0.0–3.0)
Eosinophils Absolute: 0.4 10*3/uL (ref 0.0–0.7)
Eosinophils Relative: 9 % — ABNORMAL HIGH (ref 0.0–5.0)
HCT: 46 % (ref 39.0–52.0)
Hemoglobin: 15.7 g/dL (ref 13.0–17.0)
Lymphocytes Relative: 29.7 % (ref 12.0–46.0)
Lymphs Abs: 1.4 10*3/uL (ref 0.7–4.0)
MCHC: 34 g/dL (ref 30.0–36.0)
MCV: 95.9 fl (ref 78.0–100.0)
Monocytes Absolute: 0.4 10*3/uL (ref 0.1–1.0)
Monocytes Relative: 8 % (ref 3.0–12.0)
Neutro Abs: 2.4 10*3/uL (ref 1.4–7.7)
Neutrophils Relative %: 52.6 % (ref 43.0–77.0)
Platelets: 225 10*3/uL (ref 150.0–400.0)
RBC: 4.79 Mil/uL (ref 4.22–5.81)
RDW: 13.1 % (ref 11.5–15.5)
WBC: 4.5 10*3/uL (ref 4.0–10.5)

## 2019-08-22 LAB — LIPID PANEL
Cholesterol: 218 mg/dL — ABNORMAL HIGH (ref 0–200)
HDL: 63.3 mg/dL (ref >39.00)
LDL Cholesterol: 131 mg/dL — ABNORMAL HIGH (ref 0–99)
NonHDL: 155.09
Total CHOL/HDL Ratio: 3
Triglycerides: 118 mg/dL (ref 0.0–149.0)
VLDL: 23.6 mg/dL (ref 0.0–40.0)

## 2019-08-22 LAB — VITAMIN D 25 HYDROXY (VIT D DEFICIENCY, FRACTURES): VITD: 62.76 ng/mL (ref 30.00–100.00)

## 2019-08-26 ENCOUNTER — Telehealth: Payer: Self-pay | Admitting: Nurse Practitioner

## 2019-08-26 LAB — THYROID PANEL WITH TSH
Free Thyroxine Index: 2.6 (ref 1.4–3.8)
T3 Uptake: 36 % — ABNORMAL HIGH (ref 22–35)
T4, Total: 7.3 ug/dL (ref 4.9–10.5)
TSH: 2.15 mIU/L (ref 0.40–4.50)

## 2019-08-26 LAB — TESTOS,TOTAL,FREE AND SHBG (FEMALE)
Free Testosterone: 27.2 pg/mL — ABNORMAL LOW (ref 30.0–135.0)
Sex Hormone Binding: 71 nmol/L (ref 22–77)
Testosterone, Total, LC-MS-MS: 332 ng/dL (ref 250–1100)

## 2019-08-26 NOTE — Telephone Encounter (Signed)
Labs reviewed.  Lipids higher than last year.  Is he still taking zetia?  Should resume if not.

## 2019-08-26 NOTE — Telephone Encounter (Signed)
Call to patient to verify he is still taking zetia. Pt reports compliance with all ordered rx.   Routing to Ignacia Bayley, NP for updated POC.

## 2019-08-26 NOTE — Telephone Encounter (Signed)
The patient had lab work with Dr. Derrel Nip on 08/22/19- to Ignacia Bayley, NP to review.

## 2019-08-26 NOTE — Telephone Encounter (Signed)
Patient states he had is blood work done this past Friday. Please review.

## 2019-08-27 DIAGNOSIS — I25119 Atherosclerotic heart disease of native coronary artery with unspecified angina pectoris: Secondary | ICD-10-CM | POA: Diagnosis not present

## 2019-08-27 DIAGNOSIS — I6523 Occlusion and stenosis of bilateral carotid arteries: Secondary | ICD-10-CM | POA: Diagnosis not present

## 2019-08-27 NOTE — Telephone Encounter (Signed)
Zetia was started 03/2018 with improvement in lipids shortly thereafter.  Current numbers are very similar to values prior to starting zetia.  Would you mind checking with his pharmacy to ensure that he is filling?  Unless he has dramatically changed his diet (wt relatively unchanged), current values appear that he isn't responding to zetia.

## 2019-08-27 NOTE — Telephone Encounter (Signed)
[facility]  Refilled 01/2019 (90 day) 08/02/19 (90 day)  Made call to mail in pharmacy he uses for other medications. They reported no hx of sending zetia to this patient.   I cant see where it has been sent to different pharmacy in the past year.   Routing to provider to make aware.

## 2019-09-01 ENCOUNTER — Ambulatory Visit: Payer: PPO | Admitting: Dermatology

## 2019-09-01 ENCOUNTER — Other Ambulatory Visit: Payer: Self-pay

## 2019-09-01 ENCOUNTER — Encounter: Payer: Self-pay | Admitting: Dermatology

## 2019-09-01 DIAGNOSIS — Z86018 Personal history of other benign neoplasm: Secondary | ICD-10-CM | POA: Diagnosis not present

## 2019-09-01 DIAGNOSIS — L821 Other seborrheic keratosis: Secondary | ICD-10-CM

## 2019-09-01 DIAGNOSIS — L814 Other melanin hyperpigmentation: Secondary | ICD-10-CM

## 2019-09-01 DIAGNOSIS — L720 Epidermal cyst: Secondary | ICD-10-CM

## 2019-09-01 DIAGNOSIS — D1801 Hemangioma of skin and subcutaneous tissue: Secondary | ICD-10-CM

## 2019-09-01 DIAGNOSIS — D229 Melanocytic nevi, unspecified: Secondary | ICD-10-CM

## 2019-09-01 DIAGNOSIS — D225 Melanocytic nevi of trunk: Secondary | ICD-10-CM | POA: Diagnosis not present

## 2019-09-01 DIAGNOSIS — Z1283 Encounter for screening for malignant neoplasm of skin: Secondary | ICD-10-CM | POA: Diagnosis not present

## 2019-09-01 DIAGNOSIS — L820 Inflamed seborrheic keratosis: Secondary | ICD-10-CM

## 2019-09-01 DIAGNOSIS — Z86006 Personal history of melanoma in-situ: Secondary | ICD-10-CM | POA: Diagnosis not present

## 2019-09-01 DIAGNOSIS — D1722 Benign lipomatous neoplasm of skin and subcutaneous tissue of left arm: Secondary | ICD-10-CM | POA: Diagnosis not present

## 2019-09-01 DIAGNOSIS — L578 Other skin changes due to chronic exposure to nonionizing radiation: Secondary | ICD-10-CM | POA: Diagnosis not present

## 2019-09-01 NOTE — Progress Notes (Signed)
   Follow-Up Visit   Subjective  Theodore [name] is a 86 y.o. male who presents for the following: Annual Exam (Total body skin exam hx of Melanoma L jaw, R ear).  No spots changing or bothersome today.   The following portions of the chart were reviewed this encounter and updated as appropriate:      Review of Systems:  No other skin or systemic complaints except as noted in HPI or Assessment and Plan.  Objective  Well appearing patient in no apparent distress; mood and affect are within normal limits.  A full examination was performed including scalp, head, eyes, ears, nose, lips, neck, chest, axillae, abdomen, back, buttocks, bilateral upper extremities, bilateral lower extremities, hands, feet, fingers, toes, fingernails, and toenails. All findings within normal limits unless otherwise noted below.  Objective  L jaw, R ear: Well healed scar with no evidence of recurrence  Objective  Left Shoulder - Anterior: Scar with no evidence of recurrence.   Objective  Central frontal scalp: 2.0cm speckled brown patch  Objective  Left Upper Back: 2.0cm firm sq nodule  Objective  Upper Arms: 1.5cm rubbery nodule left upper arm; rubbery nodules bil upper arms  Objective  Right Lower Sternum: 4.40mm medium dark brown macule, speckled  Images    Objective  Left Pretibia: Erythematous keratotic or waxy stuck-on papule    Assessment & Plan  Hx of melanoma in situ L jaw, R ear  Clear. Observe for recurrence. Call clinic for new or changing lesions.  Recommend regular skin exams, daily broad-spectrum spf 30+ sunscreen use, and photoprotection.     History of dysplastic nevus Left Shoulder - Anterior  Clear, observe for changes   Seborrheic keratosis Central frontal scalp  Bx-proven, Benign, observe.    Epidermal inclusion cyst Left Upper Back  Reassured benign growth.  Recommend observation.  Discussed surgical excision in office if changes noted or symptomatic.      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    Nevus Right Lower Sternum  vs SK  Benign-appearing.  Observation.  Call clinic for new or changing moles.  Recommend daily use of broad spectrum spf 30+ sunscreen to sun-exposed areas.    Inflamed seborrheic keratosis Left Pretibia  Benign, observe.  Discussed LN2 if becomes bothersome.  Pt defers today   Skin cancer screening performed today. Actinic Damage - diffuse scaly erythematous macules with underlying dyspigmentation - Recommend daily broad spectrum sunscreen SPF 30+ to sun-exposed areas, reapply every 2 hours as needed.  - Call for new or changing lesions.  Seborrheic Keratoses - Stuck-on, waxy, tan-brown papules and plaques  - Discussed benign etiology and prognosis. - Observe - Call for any changes  Hemangiomas - Red papules - Discussed benign nature - Observe - Call for any changes  Lentigines - Scattered tan macules - Discussed due to sun exposure - Benign, observe - Call for any changes   Return in about 6 months (around 03/02/2020) for UBSE.  IJamesetta Orleans, CMA, am acting as scribe for [name], MD .  Documentation: I have reviewed the above documentation for accuracy and completeness, and I agree with the above.  [name] MD

## 2019-09-05 DIAGNOSIS — M65332 Trigger finger, left middle finger: Secondary | ICD-10-CM | POA: Diagnosis not present

## 2019-09-05 DIAGNOSIS — G5601 Carpal tunnel syndrome, right upper limb: Secondary | ICD-10-CM | POA: Diagnosis not present

## 2019-09-08 NOTE — Telephone Encounter (Signed)
Just closing the loop on this.  So it does appear that there was a gap in time where he had not filled his zetia, thus his current numbers are reflective of missing it for several months.  It appears that he last refilled it on 5/22, and should continue once daily dosing.

## 2019-09-11 ENCOUNTER — Ambulatory Visit (INDEPENDENT_AMBULATORY_CARE_PROVIDER_SITE_OTHER): Payer: PPO

## 2019-09-11 ENCOUNTER — Other Ambulatory Visit: Payer: Self-pay

## 2019-09-11 DIAGNOSIS — R0600 Dyspnea, unspecified: Secondary | ICD-10-CM

## 2019-09-11 DIAGNOSIS — R0609 Other forms of dyspnea: Secondary | ICD-10-CM

## 2019-09-19 DIAGNOSIS — G250 Essential tremor: Secondary | ICD-10-CM | POA: Diagnosis not present

## 2019-09-23 ENCOUNTER — Other Ambulatory Visit: Payer: Self-pay

## 2019-09-23 ENCOUNTER — Ambulatory Visit (INDEPENDENT_AMBULATORY_CARE_PROVIDER_SITE_OTHER): Payer: PPO | Admitting: Internal Medicine

## 2019-09-23 ENCOUNTER — Encounter: Payer: Self-pay | Admitting: Internal Medicine

## 2019-09-23 VITALS — BP 130/66 | HR 65 | Temp 97.6°F | Resp 14 | Ht 75.0 in | Wt 189.4 lb

## 2019-09-23 DIAGNOSIS — R0600 Dyspnea, unspecified: Secondary | ICD-10-CM

## 2019-09-23 DIAGNOSIS — R635 Abnormal weight gain: Secondary | ICD-10-CM | POA: Diagnosis not present

## 2019-09-23 DIAGNOSIS — R7989 Other specified abnormal findings of blood chemistry: Secondary | ICD-10-CM | POA: Diagnosis not present

## 2019-09-23 LAB — CBC WITH DIFFERENTIAL/PLATELET
Basophils Absolute: 0 10*3/uL (ref 0.0–0.1)
Basophils Relative: 0.7 % (ref 0.0–3.0)
Eosinophils Absolute: 0.3 10*3/uL (ref 0.0–0.7)
Eosinophils Relative: 5.3 % — ABNORMAL HIGH (ref 0.0–5.0)
HCT: 43.2 % (ref 39.0–52.0)
Hemoglobin: 14.8 g/dL (ref 13.0–17.0)
Lymphocytes Relative: 21.5 % (ref 12.0–46.0)
Lymphs Abs: 1.1 10*3/uL (ref 0.7–4.0)
MCHC: 34.3 g/dL (ref 30.0–36.0)
MCV: 95.2 fl (ref 78.0–100.0)
Monocytes Absolute: 0.4 10*3/uL (ref 0.1–1.0)
Monocytes Relative: 7.8 % (ref 3.0–12.0)
Neutro Abs: 3.2 10*3/uL (ref 1.4–7.7)
Neutrophils Relative %: 64.7 % (ref 43.0–77.0)
Platelets: 211 10*3/uL (ref 150.0–400.0)
RBC: 4.54 Mil/uL (ref 4.22–5.81)
RDW: 13.9 % (ref 11.5–15.5)
WBC: 4.9 10*3/uL (ref 4.0–10.5)

## 2019-09-23 LAB — TSH: TSH: 1.39 u[IU]/mL (ref 0.35–4.50)

## 2019-09-23 LAB — COMPREHENSIVE METABOLIC PANEL
ALT: 18 U/L (ref 0–53)
AST: 22 U/L (ref 0–37)
Albumin: 4.1 g/dL (ref 3.5–5.2)
Alkaline Phosphatase: 42 U/L (ref 39–117)
BUN: 26 mg/dL — ABNORMAL HIGH (ref 6–23)
CO2: 28 mEq/L (ref 19–32)
Calcium: 8.9 mg/dL (ref 8.4–10.5)
Chloride: 105 mEq/L (ref 96–112)
Creatinine, Ser: 1.22 mg/dL (ref 0.40–1.50)
GFR: 56.02 mL/min — ABNORMAL LOW (ref >60.00)
Glucose, Bld: 87 mg/dL (ref 70–99)
Potassium: 4 mEq/L (ref 3.5–5.1)
Sodium: 140 mEq/L (ref 135–145)
Total Bilirubin: 1.1 mg/dL (ref 0.2–1.2)
Total Protein: 6.3 g/dL (ref 6.0–8.3)

## 2019-09-23 NOTE — Patient Instructions (Signed)
Good to see you!   I'll have the results in a few days

## 2019-09-23 NOTE — Progress Notes (Signed)
Subjective:  Patient ID: Theodore [name], male    DOB: 01-25-1930  Age: 86 y.o. MRN: [id number]  CC: The primary encounter diagnosis was Weight gain. Diagnoses of Low serum testosterone level and Dyspnea, unspecified type were also pertinent to this visit.  HPI [name] presents for evaluation of reported weight gain   This visit occurred during the SARS-CoV-2 public health emergency.  Safety protocols were in place, including screening questions prior to the visit, additional usage of staff PPE, and extensive cleaning of exam room while observing appropriate contact time as indicated for disinfecting solutions.    He has been weighing daily .using a [employer] scale and reports that he has gained 11 lbs in the last ten days.    185 today . Has been weighing daily and noting a  1 lb daily gain.  He denies any  dietary changes.  Diet is very lean and was reviewed today in detail.   Only desserts is a 1/3 of a single serve ice cream Sunday.  Wife has been weighing daily on same machine and weight has not changed .   Pants are getting tighter.  Some dyspnea which is chronic and unchanged. No orthopnea . However he is not walking for exercise as he was prior to undergoing a US guided thalotomy which was done in May for treatment of essential tremor( done by a neurosurgeon at [place]). Used to walk 3-4 times per week,  One mile at a brisk pace .   Now not walking  At all  Due to heat and gait instabilty which is improving.  Weight gain is not an anticipated side effect of procedure.  Gait is improving and feeling more stable.   Had cardiology evaluation last week for "palpitations".  EKG and ECHO were done and reviewed.     No constipation.  Left knee arthritis avoiding replacement   Outpatient Medications Prior to Visit  Medication Sig Dispense Refill   Alpha-Lipoic Acid 600 MG CAPS Take by mouth.     ascorbic Acid (VITAMIN C) 500 MG CPCR Take by mouth.     b complex vitamins tablet  Take by mouth.     COD LIVER OIL PO Take by mouth.     Coenzyme Q10 100 MG capsule Take by mouth.     cycloSPORINE (RESTASIS) 0.05 % ophthalmic emulsion Place 1 drop into both eyes 2 (two) times daily.     donepezil (ARICEPT) 5 MG tablet Take by mouth.     ezetimibe (ZETIA) 10 MG tablet Take 1 tablet (10 mg total) by mouth daily. 90 tablet 3   Flaxseed, Linseed, (FLAX SEEDS PO) Take by mouth daily. With omega 3     levothyroxine (SYNTHROID) 100 MCG tablet Take 1 tablet (100 mcg total) by mouth daily before breakfast. 90 tablet 0   LUTEIN PO Take by mouth.     magnesium oxide (MAG-OX) 400 MG tablet Take 400 mg by mouth daily.     OVER THE COUNTER MEDICATION Take 1,000 mg by mouth daily. Concentrated raw butter oil     OVER THE COUNTER MEDICATION Take 4 tablets by mouth daily. Standard Process- Cardio Plus      OVER THE COUNTER MEDICATION Take 1 tablet by mouth daily. Cardio Platinum - Vitamin K-2 500 mg, Arginine 300 mg, Natokinase 100 mg     OVER THE COUNTER MEDICATION Take by mouth daily. Hemp oil     Probiotic Product (PROBIOTIC ACIDOPHILUS) CAPS Take 1 capsule by mouth  daily.     testosterone cypionate (DEPOTESTOSTERONE CYPIONATE) 200 MG/ML injection INJECT 0.5 ML (100 MG TOTAL) INTO THE MUSCLE EVERY 14 (FOURTEEN) DAYS. 1 mL 5   Turmeric 400 MG CAPS Take by mouth.     buPROPion (WELLBUTRIN XL) 150 MG 24 hr tablet Take by mouth.     buPROPion HBr (APLENZIN PO) bupropion HBr     Cholecalciferol 125 MCG (5000 UT) TABS Take by mouth.     famotidine (PEPCID) 20 MG tablet Take by mouth.     meloxicam (MOBIC) 7.5 MG tablet Take by mouth.     Phosphatidylserine 100 MG CAPS Take by mouth daily. (Patient not taking: Reported on 09/23/2019)     S-Adenosylmethionine 400 MG TABS Take by mouth. (Patient not taking: Reported on 09/23/2019)     UNABLE TO FIND Take by mouth. (Patient not taking: Reported on 09/23/2019)     No facility-administered medications prior to visit.     Review of Systems;  Patient denies headache, fevers, malaise, unintentional weight loss, skin rash, eye pain, sinus congestion and sinus pain, sore throat, dysphagia,  hemoptysis , cough, dyspnea, wheezing, chest pain, palpitations, orthopnea, edema, abdominal pain, nausea, melena, diarrhea, constipation, flank pain, dysuria, hematuria, urinary  Frequency, nocturia, numbness, tingling, seizures,  Focal weakness, Loss of consciousness,  Tremor, insomnia, depression, anxiety, and suicidal ideation.      Objective:  BP 130/66 (BP Location: Left Arm, Patient Position: Sitting, Cuff Size: Normal)    Pulse 65    Temp 97.6 F (36.4 C) (Oral)    Resp 14    Ht 6\' 3"  (1.905 m)    Wt 189 lb 6.4 oz (85.9 kg)    SpO2 98%    BMI 23.67 kg/m   BP Readings from Last 3 Encounters:  09/23/19 130/66  08/14/19 (!) 130/54  04/07/19 133/75    Wt Readings from Last 3 Encounters:  09/23/19 189 lb 6.4 oz (85.9 kg)  08/14/19 183 lb (83 kg)  04/07/19 178 lb 6.4 oz (80.9 kg)    General appearance: alert, cooperative and appears stated age Ears: normal TM's and external ear canals both ears Throat: lips, mucosa, and tongue normal; teeth and gums normal Neck: no adenopathy, no carotid bruit, supple, symmetrical, trachea midline and thyroid not enlarged, symmetric, no tenderness/mass/nodules Back: symmetric, no curvature. ROM normal. No CVA tenderness. Lungs: clear to auscultation bilaterally Heart: regular rate and rhythm, S1, S2 normal, no murmur, click, rub or gallop Abdomen: soft, non-tender; bowel sounds normal; no masses,  no organomegaly Pulses: 2+ and symmetric Skin: Skin color, texture, turgor normal. No rashes or lesions Lymph nodes: Cervical, supraclavicular, and axillary nodes normal.  Lab Results  Component Value Date   HGBA1C 4.7 05/28/2018    Lab Results  Component Value Date   CREATININE 1.22 09/23/2019   CREATININE 1.21 08/22/2019   CREATININE 1.21 08/19/2018    Lab Results   Component Value Date   WBC 4.9 09/23/2019   HGB 14.8 09/23/2019   HCT 43.2 09/23/2019   PLT 211.0 09/23/2019   GLUCOSE 87 09/23/2019   CHOL 218 (H) 08/22/2019   TRIG 118.0 08/22/2019   HDL 63.30 08/22/2019   LDLCALC 131 (H) 08/22/2019   ALT 18 09/23/2019   AST 22 09/23/2019   NA 140 09/23/2019   K 4.0 09/23/2019   CL 105 09/23/2019   CREATININE 1.22 09/23/2019   BUN 26 (H) 09/23/2019   CO2 28 09/23/2019   TSH 1.39 09/23/2019   PSA 9.77 (H) 10/18/2018  HGBA1C 4.7 05/28/2018    CT HEAD WO CONTRAST  Result Date: 06/13/2019 CLINICAL DATA:  Chronic tremors. EXAM: CT HEAD WITHOUT CONTRAST TECHNIQUE: Contiguous axial images were obtained from the base of the skull through the vertex without intravenous contrast. COMPARISON:  MRI of the brain February 10, 2019 FINDINGS: Brain: No evidence of acute infarction, hemorrhage, hydrocephalus, extra-axial collection or mass lesion/mass effect. Vascular: Calcified plaques are noted in the bilateral carotid siphons and in the left vertebral artery. Skull: Normal. Negative for fracture or focal lesion. Sinuses/Orbits: No acute finding. Other: Small amount of cerumen in the bilateral external auditory canals. IMPRESSION: 1. No acute intracranial abnormality. 2. 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 Electronically Signed   By: Pedro Earls M.D.   On: 06/13/2019 11:05    Assessment & Plan:   Problem List Items Addressed This Visit      Unprioritized   Low serum testosterone level   Relevant Orders   Testos,Total,Free and SHBG (Male)   Weight gain - Primary    Thyroid function repeated and thyroid is active.  Likely due to lack of activity .  Testosterone level pending   Lab Results  Component Value Date   TSH 1.39 09/23/2019          Relevant Orders   TSH (Completed)    Other Visit Diagnoses    Dyspnea, unspecified type       Relevant Orders   CBC with  Differential/Platelet (Completed)   Comprehensive metabolic panel (Completed)     I provided  30 minutes of  face-to-face time during this encounter reviewing patient's current problems and past surgeries, labs and imaging studies, providing counseling on the above mentioned problems , and coordination  of care .  I have discontinued Carloyn Manner A. Armor's Phosphatidylserine, meloxicam, famotidine, buPROPion HBr (APLENZIN PO), UNABLE TO FIND, buPROPion, Cholecalciferol, and S-Adenosylmethionine. I am also having him maintain his COD LIVER OIL PO, magnesium oxide, OVER THE COUNTER MEDICATION, OVER THE COUNTER MEDICATION, OVER THE COUNTER MEDICATION, Probiotic Acidophilus BioBeads, OVER THE COUNTER MEDICATION, LUTEIN PO, (Flaxseed, Linseed, (FLAX SEEDS PO)), ezetimibe, ascorbic Acid, Alpha-Lipoic Acid, b complex vitamins, Coenzyme Q10, cycloSPORINE, donepezil, Turmeric, levothyroxine, and testosterone cypionate.  No orders of the defined types were placed in this encounter.   Medications Discontinued During This Encounter  Medication Reason   meloxicam (MOBIC) 7.5 MG tablet Error   famotidine (PEPCID) 20 MG tablet Error   buPROPion HBr (APLENZIN PO) Error   buPROPion (WELLBUTRIN XL) 150 MG 24 hr tablet Error   Cholecalciferol 125 MCG (5000 UT) TABS Error   Phosphatidylserine 100 MG CAPS Completed Course   S-Adenosylmethionine 400 MG TABS Error   UNABLE TO FIND Error    Follow-up: No follow-ups on file.   [name], MD

## 2019-09-23 NOTE — Assessment & Plan Note (Signed)
Thyroid function repeated and thyroid is active.  Likely due to lack of activity .  Testosterone level pending   Lab Results  Component Value Date   TSH 1.39 09/23/2019

## 2019-09-24 LAB — TESTOS,TOTAL,FREE AND SHBG (FEMALE)

## 2019-09-28 NOTE — Progress Notes (Signed)
Cardiology Office Note  Date:  09/29/2019   ID:  Theodore [name], DOB 1929/09/26, MRN [id number]  PCP:  [name], MD   Chief Complaint  Patient presents with  . Other    Follow up for Echo, 6-8 weeks; medication verbally reviewed with patient    HPI:  86 yo gentleman who lives at [facility] with notes indicating a history of asymptomatic bradycardia,  prior history of depression,  who presented to the hospital with episodes of shortness of breath, lightheadedness, fatigue 05/10/2016 COPD  Who presents for follow-up of his sinus bradycardia and shortness of breath  Last seen by myself in clinic January 2020 Seen by one of our providers January 2021 and again in June 2021  seen by pulmonology 02/12/19.   more SOB over last 6 weeks and recommended to use his albuterol inhaler.  CXR 02/12/19 with no acute cardiopulmonary findings.   Previously declined statin  Reports having procedure for his Essential tremors, Following procedure, symptoms are much improved on the right,  Had treatment at [place], u/s teratment ,  Followed by neurology, they recommended the procedure Affected gait on right, right leg Hoarse voice, should improve [facility], [place], dr. Wynonia Sours Follows with zoom call  Less active, less exercise Thinks as it is right leg strength improves following procedure as detailed above he will restart his exercise program  Echo, results discussed in detail 1. Left ventricular ejection fraction, by estimation, is 55 to 60%. The  left ventricle has normal function. The left ventricle has no regional  wall motion abnormalities. Left ventricular diastolic parameters were  normal.  2. Right ventricular systolic function is normal. The right ventricular  size is normal.   Still with fluttering feeling in chest, comes and goes PVCs in the past  Does not want cholesterol pill, statin CT scan with aortic atherosclerosis ,coronary calcification Reviewed recent ABI  studies with him showing moderate bilateral disease  Had flu shot, did not want to  Asymptomatic PVCs PVCs seen on EKG today  Chronic shortness of breath, previously seen by pulmonary Inhalers did not seem to help Cardiopulmonary stress testing results as detailed below Prior stress testing for symptoms with no significant ischemia  EKG personally reviewed by myself on todays visit Shows sinus bradycardia rate 53 bpm no significant ST-T wave changes   Other past medical history reviewed Cardiopulmonary stress testing was ordered 2019  gas exchange demonstrates excellent functional capacity no evidence of cardiopulmonary limitations.  At peak exercise, patient approached ventilatory limits and is likely hyperventilating and shown with elevated VE/VCO2 slope.  EKG personally reviewed by myself on todays visit Shows sinus bradycardia rate 53 bpm no significant ST-T wave changes  Other past medical history reviewed Prior history of depression October 2017,  Weight loss with anorexia  CT scan done in the hospital showing no significant PE contribute to shortness of breath  shows three-vessel coronary calcifications at least moderate in severity, aortic arch calcification mild to moderate   PMH:   has a past medical history of Arthritis, Asymptomatic Sinus bradycardia, Benign essential tremor, Cancer ([place]) (2002), Colon polyp, COPD (chronic obstructive pulmonary disease) ([place]), Coronary artery calcification seen on CT scan, Depression, History of echocardiogram, History of kidney stones, dysplastic nevus (05/07/2017), Hypothyroidism, Hypothyroidism, Melanoma ([place]) (1990's per pt), 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  PSH:    Past Surgical History:  Procedure Laterality Date  . ADENOIDECTOMY    .  CATARACT EXTRACTION W/PHACO Left 10/22/2017   Procedure: CATARACT  EXTRACTION PHACO AND INTRAOCULAR LENS PLACEMENT ([place])  LEFT;  Surgeon: Eulogio Bear, MD;  Location: [facility];  Service: Ophthalmology;  Laterality: Left;  . CATARACT EXTRACTION W/PHACO Right 11/20/2017   Procedure: CATARACT EXTRACTION PHACO AND INTRAOCULAR LENS PLACEMENT ([place]) RIGHT;  Surgeon: Eulogio Bear, MD;  Location: [place];  Service: Ophthalmology;  Laterality: Right;  . ESOPHAGOGASTRODUODENOSCOPY (EGD) WITH PROPOFOL N/A 10/31/2016   Procedure: ESOPHAGOGASTRODUODENOSCOPY (EGD) WITH PROPOFOL;  Surgeon: Lollie Sails, MD;  [facility] ENDOSCOPY;  Service: Endoscopy;  Laterality: N/A;  . JOINT REPLACEMENT Right 2003   knee  . knee meniscus repair Right   . MELANOMA EXCISION Right    ear. Followed by Dr. Nehemiah Massed  . PATELLECTOMY Bilateral   . TONSILLECTOMY      Current Outpatient Medications  Medication Sig Dispense Refill  . Alpha-Lipoic Acid 600 MG CAPS Take by mouth.    [name] ascorbic Acid (VITAMIN C) 500 MG CPCR Take by mouth.    [name] b complex vitamins tablet Take by mouth.    . COD LIVER OIL PO Take by mouth.    . Coenzyme Q10 100 MG capsule Take by mouth.    . cycloSPORINE (RESTASIS) 0.05 % ophthalmic emulsion Place 1 drop into both eyes 2 (two) times daily.    [name] donepezil (ARICEPT) 5 MG tablet Take by mouth.    . ezetimibe (ZETIA) 10 MG tablet Take 1 tablet (10 mg total) by mouth daily. 90 tablet 3  . Flaxseed, Linseed, (FLAX SEEDS PO) Take by mouth daily. With omega 3    . levothyroxine (SYNTHROID) 100 MCG tablet Take 1 tablet (100 mcg total) by mouth daily before breakfast. 90 tablet 0  . LUTEIN PO Take by mouth.    . magnesium oxide (MAG-OX) 400 MG tablet Take 400 mg by mouth daily.    [name] OVER THE COUNTER MEDICATION Take 4 tablets by mouth daily. Standard Process- Cardio Plus     . OVER THE COUNTER MEDICATION Take 1 tablet by mouth daily. Cardio Platinum - Vitamin K-2 500 mg, Arginine 300 mg, Natokinase 100 mg    . OVER THE COUNTER  MEDICATION Take by mouth daily. Hemp oil    . Probiotic Product (PROBIOTIC ACIDOPHILUS) CAPS Take 1 capsule by mouth daily.    [name] testosterone cypionate (DEPOTESTOSTERONE CYPIONATE) 200 MG/ML injection INJECT 0.5 ML (100 MG TOTAL) INTO THE MUSCLE EVERY 14 (FOURTEEN) DAYS. 1 mL 5  . Turmeric 400 MG CAPS Take by mouth.     No current facility-administered medications for this visit.     Allergies:   Penicillins   Social History:  The patient  reports that he has never smoked. He has never used smokeless tobacco. He reports current alcohol use. He reports that he does not use drugs.   Family History:   family history includes Cancer in his sister; Heart disease in his father and mother; Hypertension in his mother.    Review of Systems: Review of Systems  Constitutional: Negative.        Leg weakness  Respiratory: Negative.   Cardiovascular: Negative.   Gastrointestinal: Negative.   Musculoskeletal: Negative.   Neurological: Negative.   Psychiatric/Behavioral: Negative.   All other systems reviewed and are negative.   PHYSICAL EXAM: Vitals:   09/29/19 1520  BP: 120/62  Pulse: (!) 53  SpO2: 97%   Body mass index is 23.5 kg/m.  Vitals:   09/29/19 1520  Weight:  188 lb (85.3 kg)  Height: 6\' 3"  (1.905 m)   Constitutional:  oriented to person, place, and time. No distress.  HENT:  Head: Grossly normal Eyes:  no discharge. No scleral icterus.  Neck: No JVD, no carotid bruits  Cardiovascular: Regular rate and rhythm, no murmurs appreciated Pulmonary/Chest: Clear to auscultation bilaterally, no wheezes or rails Abdominal: Soft.  no distension.  no tenderness.  Musculoskeletal: Normal range of motion Neurological:  normal muscle tone. Coordination normal. No atrophy Skin: Skin warm and dry Psychiatric: normal affect, pleasant   Recent Labs: 09/23/2019: ALT 18; BUN 26; Creatinine, Ser 1.22; Hemoglobin 14.8; Platelets 211.0; Potassium 4.0; Sodium 140; TSH 1.39    Lipid  Panel Lab Results  Component Value Date   CHOL 218 (H) 08/22/2019   HDL 63.30 08/22/2019   LDLCALC 131 (H) 08/22/2019   TRIG 118.0 08/22/2019      Wt Readings from Last 3 Encounters:  09/29/19 188 lb (85.3 kg)  09/23/19 189 lb 6.4 oz (85.9 kg)  08/14/19 183 lb (83 kg)       ASSESSMENT AND PLAN:  Bradycardia - Plan: EKG 12-Lead Asymptomatic bradycardia, rate in the 50s  Depression with anxiety Recommended regular exercise program  Aortic atherosclerosis (HCC) He is willing to try Zetia 10 mg daily.  Prescription sent in Does not want a statin  COPD Managed by Dr. Mortimer Fries, on inhalers Chronic shortness of breath, no further work-up ordered at this time  Coronary artery disease involving native coronary artery of native heart with angina pectoris [facility]) Currently with no symptoms of angina. No further workup at this time. Continue current medication regimen. Previously declined statin   Fatigue, shortness of breath Prior work-up including negative stress test, normal echocardiogram Recommended regular walking program  Depression  depression since October 2017 Continue exercise, overall doing well  CAD On aspirin with low-dose Zetia, prior stress test low risk  PAD Mild to moderate aortic atherosclerosis seen He does not want a statin On Zetia  Essential tremor Recent successful procedure discussed with him in detail, overall he is very happy with his recovery    Total encounter time more than 25 minutes  Greater than 50% was spent in counseling and coordination of care with the patient    No orders of the defined types were placed in this encounter.    Signed, Esmond Plants, M.D., Ph.D. 09/29/2019  [place], [place]

## 2019-09-29 ENCOUNTER — Ambulatory Visit (INDEPENDENT_AMBULATORY_CARE_PROVIDER_SITE_OTHER): Payer: PPO | Admitting: Cardiovascular Disease

## 2019-09-29 ENCOUNTER — Other Ambulatory Visit: Payer: Self-pay

## 2019-09-29 ENCOUNTER — Encounter: Payer: Self-pay | Admitting: Cardiovascular Disease

## 2019-09-29 VITALS — BP 120/62 | HR 53 | Ht 75.0 in | Wt 188.0 lb

## 2019-09-29 DIAGNOSIS — I25119 Atherosclerotic heart disease of native coronary artery with unspecified angina pectoris: Secondary | ICD-10-CM | POA: Diagnosis not present

## 2019-09-29 DIAGNOSIS — I700 Atherosclerosis of aorta: Secondary | ICD-10-CM

## 2019-09-29 DIAGNOSIS — R002 Palpitations: Secondary | ICD-10-CM | POA: Diagnosis not present

## 2019-09-29 DIAGNOSIS — R0600 Dyspnea, unspecified: Secondary | ICD-10-CM

## 2019-09-29 DIAGNOSIS — I739 Peripheral vascular disease, unspecified: Secondary | ICD-10-CM | POA: Diagnosis not present

## 2019-09-29 DIAGNOSIS — E782 Mixed hyperlipidemia: Secondary | ICD-10-CM | POA: Diagnosis not present

## 2019-09-29 DIAGNOSIS — R0609 Other forms of dyspnea: Secondary | ICD-10-CM

## 2019-09-29 MED ORDER — EZETIMIBE 10 MG PO TABS: 10.0000 mg | ORAL_TABLET | Freq: Every day | ORAL | 3 refills | Status: DC

## 2019-09-29 NOTE — Patient Instructions (Signed)

## 2019-09-30 NOTE — Addendum Note (Signed)
Addended by: Othelia Pulling C on: 09/30/2019 11:05 AM   Modules accepted: Orders

## 2019-10-06 ENCOUNTER — Other Ambulatory Visit: Payer: Self-pay | Admitting: Internal Medicine

## 2019-10-09 DIAGNOSIS — R2689 Other abnormalities of gait and mobility: Secondary | ICD-10-CM | POA: Diagnosis not present

## 2019-10-09 DIAGNOSIS — M25762 Osteophyte, left knee: Secondary | ICD-10-CM | POA: Diagnosis not present

## 2019-10-09 DIAGNOSIS — M1712 Unilateral primary osteoarthritis, left knee: Secondary | ICD-10-CM | POA: Diagnosis not present

## 2019-10-09 DIAGNOSIS — M25562 Pain in left knee: Secondary | ICD-10-CM | POA: Diagnosis not present

## 2019-10-09 DIAGNOSIS — M170 Bilateral primary osteoarthritis of knee: Secondary | ICD-10-CM | POA: Diagnosis not present

## 2019-10-27 DIAGNOSIS — M1712 Unilateral primary osteoarthritis, left knee: Secondary | ICD-10-CM | POA: Diagnosis not present

## 2019-10-27 DIAGNOSIS — M25562 Pain in left knee: Secondary | ICD-10-CM | POA: Diagnosis not present

## 2019-10-31 DIAGNOSIS — M65332 Trigger finger, left middle finger: Secondary | ICD-10-CM | POA: Diagnosis not present

## 2019-10-31 DIAGNOSIS — G5601 Carpal tunnel syndrome, right upper limb: Secondary | ICD-10-CM | POA: Diagnosis not present

## 2019-12-12 DIAGNOSIS — M1712 Unilateral primary osteoarthritis, left knee: Secondary | ICD-10-CM | POA: Diagnosis not present

## 2019-12-16 DIAGNOSIS — M1712 Unilateral primary osteoarthritis, left knee: Secondary | ICD-10-CM | POA: Diagnosis not present

## 2019-12-18 DIAGNOSIS — G250 Essential tremor: Secondary | ICD-10-CM | POA: Diagnosis not present

## 2019-12-25 DIAGNOSIS — H04123 Dry eye syndrome of bilateral lacrimal glands: Secondary | ICD-10-CM | POA: Diagnosis not present

## 2020-01-03 ENCOUNTER — Other Ambulatory Visit: Payer: Self-pay | Admitting: Internal Medicine

## 2020-02-03 DIAGNOSIS — M5442 Lumbago with sciatica, left side: Secondary | ICD-10-CM | POA: Diagnosis not present

## 2020-02-03 DIAGNOSIS — M9903 Segmental and somatic dysfunction of lumbar region: Secondary | ICD-10-CM | POA: Diagnosis not present

## 2020-02-09 DIAGNOSIS — M9903 Segmental and somatic dysfunction of lumbar region: Secondary | ICD-10-CM | POA: Diagnosis not present

## 2020-02-09 DIAGNOSIS — M5442 Lumbago with sciatica, left side: Secondary | ICD-10-CM | POA: Diagnosis not present

## 2020-02-12 DIAGNOSIS — M9903 Segmental and somatic dysfunction of lumbar region: Secondary | ICD-10-CM | POA: Diagnosis not present

## 2020-02-12 DIAGNOSIS — M5442 Lumbago with sciatica, left side: Secondary | ICD-10-CM | POA: Diagnosis not present

## 2020-02-17 DIAGNOSIS — M5442 Lumbago with sciatica, left side: Secondary | ICD-10-CM | POA: Diagnosis not present

## 2020-02-17 DIAGNOSIS — M9903 Segmental and somatic dysfunction of lumbar region: Secondary | ICD-10-CM | POA: Diagnosis not present

## 2020-02-19 DIAGNOSIS — M5442 Lumbago with sciatica, left side: Secondary | ICD-10-CM | POA: Diagnosis not present

## 2020-02-19 DIAGNOSIS — M9903 Segmental and somatic dysfunction of lumbar region: Secondary | ICD-10-CM | POA: Diagnosis not present

## 2020-02-20 ENCOUNTER — Telehealth: Payer: Self-pay

## 2020-02-20 DIAGNOSIS — R29898 Other symptoms and signs involving the musculoskeletal system: Secondary | ICD-10-CM

## 2020-02-20 NOTE — Telephone Encounter (Signed)
Pt would like a referral sent to [facility] for physical therapy for his leg weakness.

## 2020-02-23 DIAGNOSIS — M9903 Segmental and somatic dysfunction of lumbar region: Secondary | ICD-10-CM | POA: Diagnosis not present

## 2020-02-23 DIAGNOSIS — M5442 Lumbago with sciatica, left side: Secondary | ICD-10-CM | POA: Diagnosis not present

## 2020-02-24 DIAGNOSIS — M9903 Segmental and somatic dysfunction of lumbar region: Secondary | ICD-10-CM | POA: Diagnosis not present

## 2020-02-24 DIAGNOSIS — M5442 Lumbago with sciatica, left side: Secondary | ICD-10-CM | POA: Diagnosis not present

## 2020-02-28 ENCOUNTER — Other Ambulatory Visit: Payer: Self-pay | Admitting: Internal Medicine

## 2020-03-01 ENCOUNTER — Ambulatory Visit: Payer: PPO | Admitting: Dermatology

## 2020-03-01 ENCOUNTER — Encounter: Payer: Self-pay | Admitting: Dermatology

## 2020-03-01 ENCOUNTER — Other Ambulatory Visit: Payer: Self-pay

## 2020-03-01 DIAGNOSIS — D1722 Benign lipomatous neoplasm of skin and subcutaneous tissue of left arm: Secondary | ICD-10-CM

## 2020-03-01 DIAGNOSIS — Z86006 Personal history of melanoma in-situ: Secondary | ICD-10-CM | POA: Diagnosis not present

## 2020-03-01 DIAGNOSIS — D1720 Benign lipomatous neoplasm of skin and subcutaneous tissue of unspecified limb: Secondary | ICD-10-CM

## 2020-03-01 DIAGNOSIS — D1800 Hemangioma unspecified site: Secondary | ICD-10-CM | POA: Diagnosis not present

## 2020-03-01 DIAGNOSIS — L821 Other seborrheic keratosis: Secondary | ICD-10-CM | POA: Diagnosis not present

## 2020-03-01 DIAGNOSIS — Z85820 Personal history of malignant melanoma of skin: Secondary | ICD-10-CM

## 2020-03-01 DIAGNOSIS — D1721 Benign lipomatous neoplasm of skin and subcutaneous tissue of right arm: Secondary | ICD-10-CM

## 2020-03-01 DIAGNOSIS — L578 Other skin changes due to chronic exposure to nonionizing radiation: Secondary | ICD-10-CM | POA: Diagnosis not present

## 2020-03-01 DIAGNOSIS — D225 Melanocytic nevi of trunk: Secondary | ICD-10-CM | POA: Diagnosis not present

## 2020-03-01 DIAGNOSIS — D229 Melanocytic nevi, unspecified: Secondary | ICD-10-CM

## 2020-03-01 DIAGNOSIS — L820 Inflamed seborrheic keratosis: Secondary | ICD-10-CM | POA: Diagnosis not present

## 2020-03-01 DIAGNOSIS — Z86018 Personal history of other benign neoplasm: Secondary | ICD-10-CM | POA: Diagnosis not present

## 2020-03-01 NOTE — Telephone Encounter (Signed)
RX Refill:testosterone Last Seen:09-23-19 Last ordered:08-14-19

## 2020-03-01 NOTE — Patient Instructions (Signed)

## 2020-03-01 NOTE — Progress Notes (Signed)
Follow-Up Visit   Subjective  Theodore [name] is a 86 y.o. male who presents for the following: UBSE (Upper body skin exam, hx of Melanoma IS L jaw, hx of Melanoma R ear, hx of Dysplastic Nevus L ant shoulder).    The following portions of the chart were reviewed this encounter and updated as appropriate:       Review of Systems:  No other skin or systemic complaints except as noted in HPI or Assessment and Plan.  Objective  Well appearing patient in no apparent distress; mood and affect are within normal limits.  All skin waist up examined.  Objective  L jaw: Well healed scar with no evidence of recurrence   Objective  Right Ear: Well healed scar with no evidence of recurrence   Objective  Left anterior shoulder: Scar with no evidence of recurrence.   Objective  bil arms: Multiple rubbery nodules, 1.5cm R upper arm anticubitum  R lower sternum  Objective  R lower sternum: 4.33mm med brown macule, photo compared  Objective  Mid sternum x 1, R nasal alar crease x 1, R upper forehead x 1, R mid jaw x 1, Posterior scalp x 1, R upper arm x 1 (6): Erythematous keratotic or waxy stuck-on papule   Objective  Central frontal scalp: Speckled waxy patch  Images     Assessment & Plan    Hemangiomas - Red papules - Discussed benign nature - Observe - Call for any changes  Seborrheic Keratoses - Stuck-on, waxy, tan-brown papules and plaques  - Discussed benign etiology and prognosis. - Observe - Call for any changes  Actinic Damage - chronic, secondary to cumulative UV radiation exposure/sun exposure over time - diffuse scaly erythematous macules with underlying dyspigmentation - Recommend daily broad spectrum sunscreen SPF 30+ to sun-exposed areas, reapply every 2 hours as needed.  - Call for new or changing lesions.  History of melanoma in situ L jaw  01/2014 Mohs surgery  Clear. Observe for recurrence. Call clinic for new or changing lesions.   Recommend regular skin exams, daily broad-spectrum spf 30+ sunscreen use, and photoprotection.     History of melanoma Right Ear  1990's  Clear. Observe for recurrence. Call clinic for new or changing lesions.  Recommend regular skin exams, daily broad-spectrum spf 30+ sunscreen use, and photoprotection.     History of dysplastic nevus Left anterior shoulder  Clear. Observe for recurrence. Call clinic for new or changing lesions.  Recommend regular skin exams, daily broad-spectrum spf 30+ sunscreen use, and photoprotection.     Lipoma of upper extremity, unspecified laterality bil arms  Benign appearing, observe  Reassured benign growth.  Recommend observation.  Discussed surgical excision in office if changes noted or symptomatic.   Nevus R lower sternum  Benign-appearing.  Stable.  Observation.  Call clinic for new or changing moles.  Recommend daily use of broad spectrum spf 30+ sunscreen to sun-exposed areas.    Inflamed seborrheic keratosis (6) Mid sternum x 1, R nasal alar crease x 1, R upper forehead x 1, R mid jaw x 1, Posterior scalp x 1, R upper arm x 1  Destruction of lesion - Mid sternum x 1, R nasal alar crease x 1, R upper forehead x 1, R mid jaw x 1, Posterior scalp x 1, R upper arm x 1  Destruction method: cryotherapy   Informed consent: discussed and consent obtained   Lesion destroyed using liquid nitrogen: Yes   Region frozen until ice ball extended  beyond lesion: Yes   Outcome: patient tolerated procedure well with no complications   Post-procedure details: wound care instructions given    Seborrheic keratosis Central frontal scalp  Bx proven 2015, benign  Benign appearing, observe  Return in about 6 months (around 08/30/2020) for UBSE, Hx of Melanoma, Hx of Dysplastic nevi.  I, Othelia Pulling, RMA, am acting as scribe for [name], MD . Documentation: I have reviewed the above documentation for accuracy and completeness, and I agree with the  above.  [name] MD

## 2020-03-02 DIAGNOSIS — R2681 Unsteadiness on feet: Secondary | ICD-10-CM | POA: Diagnosis not present

## 2020-03-02 DIAGNOSIS — R262 Difficulty in walking, not elsewhere classified: Secondary | ICD-10-CM | POA: Diagnosis not present

## 2020-03-02 DIAGNOSIS — R29898 Other symptoms and signs involving the musculoskeletal system: Secondary | ICD-10-CM | POA: Diagnosis not present

## 2020-03-03 DIAGNOSIS — M9903 Segmental and somatic dysfunction of lumbar region: Secondary | ICD-10-CM | POA: Diagnosis not present

## 2020-03-03 DIAGNOSIS — M5442 Lumbago with sciatica, left side: Secondary | ICD-10-CM | POA: Diagnosis not present

## 2020-03-04 DIAGNOSIS — M5442 Lumbago with sciatica, left side: Secondary | ICD-10-CM | POA: Diagnosis not present

## 2020-03-04 DIAGNOSIS — M9903 Segmental and somatic dysfunction of lumbar region: Secondary | ICD-10-CM | POA: Diagnosis not present

## 2020-03-16 DIAGNOSIS — R262 Difficulty in walking, not elsewhere classified: Secondary | ICD-10-CM | POA: Diagnosis not present

## 2020-03-16 DIAGNOSIS — R2681 Unsteadiness on feet: Secondary | ICD-10-CM | POA: Diagnosis not present

## 2020-03-16 DIAGNOSIS — R29898 Other symptoms and signs involving the musculoskeletal system: Secondary | ICD-10-CM | POA: Diagnosis not present

## 2020-03-22 ENCOUNTER — Ambulatory Visit (INDEPENDENT_AMBULATORY_CARE_PROVIDER_SITE_OTHER): Payer: PPO

## 2020-03-22 ENCOUNTER — Encounter (INDEPENDENT_AMBULATORY_CARE_PROVIDER_SITE_OTHER): Payer: Self-pay | Admitting: Vascular Surgery

## 2020-03-22 ENCOUNTER — Other Ambulatory Visit: Payer: Self-pay

## 2020-03-22 ENCOUNTER — Ambulatory Visit (INDEPENDENT_AMBULATORY_CARE_PROVIDER_SITE_OTHER): Payer: PPO | Admitting: Vascular Surgery

## 2020-03-22 VITALS — BP 130/69 | HR 55 | Ht 75.0 in | Wt 191.0 lb

## 2020-03-22 DIAGNOSIS — I739 Peripheral vascular disease, unspecified: Secondary | ICD-10-CM

## 2020-03-22 DIAGNOSIS — J449 Chronic obstructive pulmonary disease, unspecified: Secondary | ICD-10-CM | POA: Diagnosis not present

## 2020-03-22 DIAGNOSIS — I25119 Atherosclerotic heart disease of native coronary artery with unspecified angina pectoris: Secondary | ICD-10-CM

## 2020-03-22 DIAGNOSIS — E785 Hyperlipidemia, unspecified: Secondary | ICD-10-CM

## 2020-03-22 NOTE — Progress Notes (Signed)
MRN : [id number]  Theodore [name] is a 87 y.o. (12-19-1929) male who presents with chief complaint of No chief complaint on file. [name]  History of Present Illness:   The patient returns to the office for followup and review of the noninvasive studies. There have been no interval changes in lower extremity symptoms.He continues to describe a weakness and "shuffling of my feet".No interval development of rest pain symptoms. No new ulcers or wounds have occurred since the last visit.  There have been no significant changes to the patient's overall health care.  The patient denies amaurosis fugax or recent TIA symptoms. There are no recent neurological changes noted. The patient denies history of DVT, PE or superficial thrombophlebitis. The patient denies recent episodes of angina or shortness of breath.   ABI Rt=0.0.83and Lt=0.83(previous ABI Rt=0.90and Lt=0.73) Previous duplex ultrasound of theBilateral lower extremities arterial system shows normal femoral popliteal arteries with mixed tibial disease  No outpatient medications have been marked as taking for the 03/22/20 encounter (Appointment) with Delana Meyer, Dolores Lory, MD.    Past Medical History:  Diagnosis Date  . Arthritis    knees  . Asymptomatic Sinus bradycardia    a. 05/2016 Zio: Avg HR 61 (41-167).  . Benign essential tremor   . Cancer [facility]) 2002   melanoma right ear  . Colon polyp   . COPD (chronic obstructive pulmonary disease) ([place])    pt said he believes it is a misdiagnosis   . Coronary artery calcification seen on CT scan    a. 06/2016 CTA Chest: cor Ca2+; b. 05/2017 MV: EF >65%. No ischemia/infarct.  . Depression   . History of echocardiogram    a. 04/2016 Echo: EF 60-65%, mild conc LVH. Nl PASP.  [name] History of kidney stones   . Hx of dysplastic nevus 05/07/2017   L anterior shoulder, severe  . Hypothyroidism   . Hypothyroidism   . Melanoma ([place]) 1990's per pt   R ear  . 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 Palpitations    a. 05/2016 Zio: Avg HR 61 (41-167). 9 SVT runs (fastest 167 - 5 beats; longest 8 beats - 101 bpm). Rare PACs/PVCs.  . PVC's (premature ventricular contractions)   . Rhinitis   . Wears dentures    partial upper and lower  . Wears hearing aid in both ears     Past Surgical History:  Procedure Laterality Date  . ADENOIDECTOMY    . CATARACT EXTRACTION W/PHACO Left 10/22/2017   Procedure: CATARACT EXTRACTION PHACO AND INTRAOCULAR LENS PLACEMENT ([place])  LEFT;  Surgeon: Eulogio Bear, MD;  Location: [place];  Service: Ophthalmology;  Laterality: Left;  . CATARACT EXTRACTION W/PHACO Right 11/20/2017   Procedure: CATARACT EXTRACTION PHACO AND INTRAOCULAR LENS PLACEMENT ([place]) RIGHT;  Surgeon: Eulogio Bear, MD;  Location: [place];  Service: Ophthalmology;  Laterality: Right;  . ESOPHAGOGASTRODUODENOSCOPY (EGD) WITH PROPOFOL N/A 10/31/2016   Procedure: ESOPHAGOGASTRODUODENOSCOPY (EGD) WITH PROPOFOL;  Surgeon: Lollie Sails, MD;  Location: [facility] ENDOSCOPY;  Service: Endoscopy;  Laterality: N/A;  . JOINT REPLACEMENT Right 2003   knee  . knee meniscus repair Right   . MELANOMA EXCISION Right    ear. Followed by Dr. Nehemiah Massed  . PATELLECTOMY Bilateral   . TONSILLECTOMY      Social History Social History   Tobacco Use  . Smoking status: Never Smoker  . Smokeless tobacco: Never Used  Vaping Use  . Vaping Use: Never  used  Substance Use Topics  . Alcohol use: Yes    Comment: 1 glass of wine daily  . Drug use: No    Family History Family History  Problem Relation Age of Onset  . Hypertension Mother   . Heart disease Mother        CHF  . Heart disease Father   . Cancer Sister        melanoma    Allergies  Allergen Reactions  . Penicillins Rash and Other (See Comments)     REVIEW OF SYSTEMS (Negative unless checked)  Constitutional: [] Weight loss  [] Fever  [] Chills Cardiac: [] Chest pain   [] Chest pressure    [] Palpitations   [] Shortness of breath when laying flat   [] Shortness of breath with exertion. Vascular:  [x] Pain in legs with walking   [] Pain in legs at rest  [] History of DVT   [] Phlebitis   [] Swelling in legs   [] Varicose veins   [] Non-healing ulcers Pulmonary:   [] Uses home oxygen   [] Productive cough   [] Hemoptysis   [] Wheeze  [] COPD   [] Asthma Neurologic:  [] Dizziness   [] Seizures   [] History of stroke   [] History of TIA  [] Aphasia   [] Vissual changes   [] Weakness or numbness in arm   [x] Weakness or numbness in leg Musculoskeletal:   [] Joint swelling   [x] Joint pain   [x] Low back pain Hematologic:  [] Easy bruising  [] Easy bleeding   [] Hypercoagulable state   [] Anemic Gastrointestinal:  [] Diarrhea   [] Vomiting  [x] Gastroesophageal reflux/heartburn   [] Difficulty swallowing. Genitourinary:  [] Chronic kidney disease   [] Difficult urination  [] Frequent urination   [] Blood in urine Skin:  [] Rashes   [] Ulcers  Psychological:  [] History of anxiety   []  History of major depression.  Physical Examination  There were no vitals filed for this visit. There is no height or weight on file to calculate BMI. Gen: WD/WN, NAD Head: [place]/AT, No temporalis wasting.  Ear/Nose/Throat: Hearing grossly intact, nares w/o erythema or drainage Eyes: PER, EOMI, sclera nonicteric.  Neck: Supple, no large masses.   Pulmonary:  Good air movement, no audible wheezing bilaterally, no use of accessory muscles.  Cardiac: RRR, no JVD Vascular:  Vessel Right Left  Radial Palpable Palpable  PT Not Palpable Not Palpable  DP Not Palpable Not Palpable  Gastrointestinal: Non-distended. No guarding/no peritoneal signs.  Musculoskeletal: M/S 5/5 throughout.  No deformity or atrophy.  Neurologic: CN 2-12 intact. Symmetrical.  Speech is fluent. Motor exam as listed above. Psychiatric: Judgment intact, Mood & affect appropriate for pt's clinical situation. Dermatologic: No rashes or ulcers noted.  No changes consistent with  cellulitis.   CBC Lab Results  Component Value Date   WBC 4.9 09/23/2019   HGB 14.8 09/23/2019   HCT 43.2 09/23/2019   MCV 95.2 09/23/2019   PLT 211.0 09/23/2019    BMET    Component Value Date/Time   NA 140 09/23/2019 1341   K 4.0 09/23/2019 1341   CL 105 09/23/2019 1341   CO2 28 09/23/2019 1341   GLUCOSE 87 09/23/2019 1341   BUN 26 (H) 09/23/2019 1341   CREATININE 1.22 09/23/2019 1341   CREATININE 0.92 02/03/2015 1504   CALCIUM 8.9 09/23/2019 1341   GFRNONAA >60 05/10/2016 0236   GFRNONAA 77 02/03/2015 1504   GFRAA >60 05/10/2016 0236   GFRAA 89 02/03/2015 1504   CrCl cannot be calculated (Patient's most recent lab result is older than the maximum 21 days allowed.).  COAG No results found for: INR, PROTIME  Radiology No results found.   Assessment/Plan 1. PAD (peripheral artery disease) (HCC)  Recommend:  The patient has evidence of atherosclerosis of the lower extremities with claudication.  The patient does not voice lifestyle limiting changes at this point in time.  Noninvasive studies do not suggest clinically significant change.  No invasive studies, angiography or surgery at this time The patient should continue walking and begin a more formal exercise program.  The patient should continue antiplatelet therapy and aggressive treatment of the lipid abnormalities  No changes in the patient's medications at this time  The patient should continue wearing graduated compression socks 10-15 mmHg strength to control the mild edema.   - VAS [place] ABI WITH/WO TBI; Future  2. Coronary artery disease involving native coronary artery of native heart with angina pectoris ([place]) Continue cardiac and antihypertensive medications as already ordered and reviewed, no changes at this time.  Continue statin as ordered and reviewed, no changes at this time  Nitrates PRN for chest pain   3. Chronic obstructive pulmonary disease, unspecified COPD type ([place]) Continue  pulmonary medications and aerosols as already ordered, these medications have been reviewed and there are no changes at this time.    4. Hyperlipidemia LDL goal <100 Continue statin as ordered and reviewed, no changes at this time   Hortencia Pilar, MD  03/22/2020 9:46 AM

## 2020-03-29 ENCOUNTER — Telehealth: Payer: Self-pay

## 2020-03-29 ENCOUNTER — Ambulatory Visit: Payer: PPO

## 2020-03-29 NOTE — Telephone Encounter (Signed)
Patient was not available for AWV. Called the office and noted the recording as closed for the day. Reschedule as appropriate.

## 2020-04-05 ENCOUNTER — Encounter (INDEPENDENT_AMBULATORY_CARE_PROVIDER_SITE_OTHER): Payer: PPO

## 2020-04-05 ENCOUNTER — Ambulatory Visit (INDEPENDENT_AMBULATORY_CARE_PROVIDER_SITE_OTHER): Payer: PPO | Admitting: Vascular Surgery

## 2020-04-06 ENCOUNTER — Encounter: Payer: Self-pay | Admitting: Internal Medicine

## 2020-04-06 ENCOUNTER — Telehealth (INDEPENDENT_AMBULATORY_CARE_PROVIDER_SITE_OTHER): Payer: PPO | Admitting: Internal Medicine

## 2020-04-06 DIAGNOSIS — U071 COVID-19: Secondary | ICD-10-CM | POA: Diagnosis not present

## 2020-04-06 HISTORY — DX: COVID-19: U07.1

## 2020-04-06 MED ORDER — CHERATUSSIN AC 100-10 MG/5ML PO SOLN: 5.0000 mL | Freq: Three times a day (TID) | ORAL | 0 refills | Status: DC | PRN

## 2020-04-06 MED ORDER — PREDNISONE 10 MG PO TABS
ORAL_TABLET | ORAL | 0 refills | Status: DC
Start: 2020-04-06 — End: 2020-08-03

## 2020-04-06 MED ORDER — IPRATROPIUM-ALBUTEROL 0.5-2.5 (3) MG/3ML IN SOLN: 3.0000 mL | Freq: Four times a day (QID) | RESPIRATORY_TRACT | 0 refills | Status: DC | PRN

## 2020-04-06 NOTE — Progress Notes (Signed)
Virtual Visit via [place]   This visit type was conducted due to national recommendations for restrictions regarding the COVID-19 pandemic (e.g. social distancing).  This format is felt to be most appropriate for this patient at this time.  All issues noted in this document were discussed and addressed.  No physical exam was performed (except for noted visual exam findings with Video Visits).   I connected with@ on 04/06/20 at  3:00 PM EST by a video enabled telemedicine application  and verified that I am speaking with the correct person using two identifiers. Location patient: home Location provider: work or home office Persons participating in the virtual visit: patient, provider  I discussed the limitations, risks, security and privacy concerns of performing an evaluation and management service by telephone and the availability of in person appointments. I also discussed with the patient that there may be a patient responsible charge related to this service. The patient expressed understanding and agreed to proceed.  Interactive audio and video telecommunications were attempted between this provider and patient, however failed, due to patient having technical difficulties.    We continued and completed visit with audio only.   Reason for visit: COVID POSITIVE   HPI:  87 YR OLD VACCINATED MALE PRESENTS WITH CONGESTION,  PRODUCTIVE COUGH, AND FATIGUE that stared 6 days ago.  He denies pleurisy and fevers, SYMPTOMS STARTED 6 DAYS AGO and covid test was positive today from [facility] . COUGH was  KEEPING HIM UP AT NIGHT,  Sleeping better now in a  RECLINER.  TAKING a cold remedy that has zyrtec/sudafed and a nebulized colloid silver using his nebulizer   ROS: See pertinent positives and negatives per HPI.  Past Medical History:  Diagnosis Date  . Arthritis    knees  . Asymptomatic Sinus bradycardia    a. 05/2016 Zio: Avg HR 61 (41-167).  . Benign essential tremor   . Cancer [facility]) 2002    melanoma right ear  . Colon polyp   . COPD (chronic obstructive pulmonary disease) ([place])    pt said he believes it is a misdiagnosis   . Coronary artery calcification seen on CT scan    a. 06/2016 CTA Chest: cor Ca2+; b. 05/2017 MV: EF >65%. No ischemia/infarct.  . Depression   . History of echocardiogram    a. 04/2016 Echo: EF 60-65%, mild conc LVH. Nl PASP.  [name] History of kidney stones   . Hx of dysplastic nevus 05/07/2017   L anterior shoulder, severe  . Hypothyroidism   . Hypothyroidism   . Melanoma ([place]) 1990's per pt   R ear  . Melanoma in situ ([place]) 01/12/2014   left jaw  . Palpitations    a. 05/2016 Zio: Avg HR 61 (41-167). 9 SVT runs (fastest 167 - 5 beats; longest 8 beats - 101 bpm). Rare PACs/PVCs.  . PVC's (premature ventricular contractions)   . Rhinitis   . Wears dentures    partial upper and lower  . Wears hearing aid in both ears     Past Surgical History:  Procedure Laterality Date  . ADENOIDECTOMY    . CATARACT EXTRACTION W/PHACO Left 10/22/2017   Procedure: CATARACT EXTRACTION PHACO AND INTRAOCULAR LENS PLACEMENT ([place])  LEFT;  Surgeon: Eulogio Bear, MD;  Location: [place];  Service: Ophthalmology;  Laterality: Left;  . CATARACT EXTRACTION W/PHACO Right 11/20/2017   Procedure: CATARACT EXTRACTION PHACO AND INTRAOCULAR LENS PLACEMENT ([place]) RIGHT;  Surgeon: Eulogio Bear, MD;  Location: [place];  Service: Ophthalmology;  Laterality: Right;  . ESOPHAGOGASTRODUODENOSCOPY (EGD) WITH PROPOFOL N/A 10/31/2016   Procedure: ESOPHAGOGASTRODUODENOSCOPY (EGD) WITH PROPOFOL;  Surgeon: Lollie Sails, MD;  Location: [facility] ENDOSCOPY;  Service: Endoscopy;  Laterality: N/A;  . JOINT REPLACEMENT Right 2003   knee  . knee meniscus repair Right   . MELANOMA EXCISION Right    ear. Followed by Dr. Nehemiah Massed  . PATELLECTOMY Bilateral   . TONSILLECTOMY      Family History  Problem Relation Age of Onset  . Hypertension Mother   . Heart disease  Mother        CHF  . Heart disease Father   . Cancer Sister        melanoma    SOCIAL HX:  reports that he has never smoked. He has never used smokeless tobacco. He reports current alcohol use. He reports that he does not use drugs.   Current Outpatient Medications:  .  Alpha-Lipoic Acid 600 MG CAPS, Take by mouth., Disp: , Rfl:  .  ascorbic Acid (VITAMIN C) 500 MG CPCR, Take by mouth., Disp: , Rfl:  .  b complex vitamins tablet, Take by mouth., Disp: , Rfl:  .  Cholecalciferol 125 MCG (5000 UT) TABS, Take by mouth., Disp: , Rfl:  .  COD LIVER OIL PO, Take by mouth., Disp: , Rfl:  .  Coenzyme Q10 100 MG capsule, Take by mouth., Disp: , Rfl:  .  cycloSPORINE (RESTASIS) 0.05 % ophthalmic emulsion, Restasis 0.05 % eye drops in a dropperette  INSTILL 1 DROP INTO BOTH EYES TWICE A DAY, Disp: , Rfl:  .  donepezil (ARICEPT) 5 MG tablet, Take by mouth., Disp: , Rfl:  .  ezetimibe (ZETIA) 10 MG tablet, Take 1 tablet (10 mg total) by mouth daily., Disp: 90 tablet, Rfl: 3 .  Flaxseed, Linseed, (FLAX SEEDS PO), Take by mouth daily. With omega 3, Disp: , Rfl:  .  guaiFENesin-codeine (CHERATUSSIN AC) 100-10 MG/5ML syrup, Take 5 mLs by mouth 3 (three) times daily as needed for cough., Disp: 120 mL, Rfl: 0 .  ipratropium-albuterol (DUONEB) 0.5-2.5 (3) MG/3ML SOLN, Take 3 mLs by nebulization every 6 (six) hours as needed., Disp: 360 mL, Rfl: 0 .  levothyroxine (SYNTHROID) 100 MCG tablet, TAKE 1 TABLET BY MOUTH EVERY DAY BEFORE BREAKFAST, Disp: 90 tablet, Rfl: 0 .  LUTEIN PO, Take by mouth., Disp: , Rfl:  .  magnesium oxide (MAG-OX) 400 MG tablet, Take 400 mg by mouth daily., Disp: , Rfl:  .  OVER THE COUNTER MEDICATION, Take 4 tablets by mouth daily. Standard Process- Cardio Plus, Disp: , Rfl:  .  OVER THE COUNTER MEDICATION, Take 1 tablet by mouth daily. Cardio Platinum - Vitamin K-2 500 mg, Arginine 300 mg, Natokinase 100 mg, Disp: , Rfl:  .  OVER THE COUNTER MEDICATION, Take by mouth daily. Hemp oil,  Disp: , Rfl:  .  predniSONE (DELTASONE) 10 MG tablet, 6 tablets on Day 1 , then reduce by 1 tablet daily until gone, Disp: 21 tablet, Rfl: 0 .  Probiotic Product (PROBIOTIC + TURMERIC EXTRACT) 400 MG CAPS, Take by mouth., Disp: , Rfl:  .  Probiotic Product (PROBIOTIC ACIDOPHILUS) CAPS, Take 1 capsule by mouth daily., Disp: , Rfl:  .  S-Adenosylmethionine 400 MG TABS, Take by mouth., Disp: , Rfl:  .  testosterone cypionate (DEPOTESTOSTERONE CYPIONATE) 200 MG/ML injection, INJECT 0.5 ML (100 MG TOTAL) INTO THE MUSCLE EVERY 14 DAYS., Disp: 1 mL, Rfl: 5 .  Turmeric 400 MG CAPS,  Take by mouth., Disp: , Rfl:   EXAM:  VITALS per patient if applicable:  GENERAL: alert, oriented, appears well and in no acute distress  HEENT: atraumatic, conjunttiva clear, no obvious abnormalities on inspection of external nose and ears  NECK: normal movements of the head and neck  LUNGS: on inspection no signs of respiratory distress, breathing rate appears normal, no obvious gross SOB, gasping or wheezing  CV: no obvious cyanosis  MS: moves all visible extremities without noticeable abnormality  PSYCH/NEURO: pleasant and cooperative, no obvious depression or anxiety, speech and thought processing grossly intact  ASSESSMENT AND PLAN:  Discussed the following assessment and plan:  COVID-19 virus infection  COVID-19 virus infection SYMPTOMS ARE MILD.  Advised to check pulse ox using home equipment.  Supportive care with prednisone taper, duonebs (given productive cough),  cheratussin     I discussed the assessment and treatment plan with the patient. The patient was provided an opportunity to ask questions and all were answered. The patient agreed with the plan and demonstrated an understanding of the instructions.   The patient was advised to call back or seek an in-person evaluation if the symptoms worsen or if the condition fails to improve as anticipated.  I provided  22 minutes of non-face-to-face  time during this encounter reviewing patient's current problems and past surgeries, labs and imaging studies, providing counseling on the above mentioned problems , and coordination  of care .  [name], MD

## 2020-04-06 NOTE — Assessment & Plan Note (Signed)
SYMPTOMS ARE MILD.  Advised to check pulse ox using home equipment.  Supportive care with prednisone taper, duonebs (given productive cough),  cheratussin

## 2020-04-06 NOTE — Patient Instructions (Signed)
HERE'S HOW YOU MANAGE COVID INFECTION :  VITAMIN C 1000 MG DAILY VITAMIN D 4000 IU DAILY  ZINC 50 MG DAILY   (stop these megadoses once you are feeling better)  Continue generic zyrtec/sudafed combination .  Adding prednisone taper,  cheratussin cough syrup (contains codeine)  And Duonebs (ipatropium/albuterol) for the cough  YOU SHOULD BE FEVER FREE FOR 24 HOURS WITHOUT THE USE OF ADVIL OR MOTRIN ,  AND ALL SYMPTOMS NEED TO BE IMPROVING BEFORE YOU CAN BREAK QUARANTINE (MINIMUM OF 10  DAYS  FROM DAY 1 OF SYMPTOMS )

## 2020-04-08 ENCOUNTER — Telehealth: Payer: Self-pay

## 2020-04-08 NOTE — Telephone Encounter (Signed)
PA submitted for duoneb has been submitted on covermymeds.

## 2020-04-12 NOTE — Telephone Encounter (Signed)
Prior authorization for Duoneb has been denied through medicare Part D but has been approved and will be covered through part A/B as long as it is prescribed by Dr. Derrel Nip and is deemed medically appropriate.  Patient has been made aware and has already picked up their medication.

## 2020-04-13 ENCOUNTER — Telehealth: Payer: Self-pay | Admitting: Internal Medicine

## 2020-04-14 ENCOUNTER — Emergency Department
Admission: EM | Admit: 2020-04-14 | Discharge: 2020-04-14 | Disposition: A | Discharge status: Home / Self Care | Payer: PPO | Attending: Emergency Medicine | Admitting: Emergency Medicine

## 2020-04-14 ENCOUNTER — Other Ambulatory Visit: Payer: Self-pay

## 2020-04-14 ENCOUNTER — Telehealth: Payer: Self-pay | Admitting: Internal Medicine

## 2020-04-14 ENCOUNTER — Telehealth: Payer: PPO | Admitting: Family

## 2020-04-14 ENCOUNTER — Emergency Department: Payer: PPO

## 2020-04-14 DIAGNOSIS — R0602 Shortness of breath: Secondary | ICD-10-CM | POA: Diagnosis not present

## 2020-04-14 DIAGNOSIS — U071 COVID-19: Secondary | ICD-10-CM | POA: Diagnosis not present

## 2020-04-14 DIAGNOSIS — J449 Chronic obstructive pulmonary disease, unspecified: Secondary | ICD-10-CM | POA: Insufficient documentation

## 2020-04-14 DIAGNOSIS — J069 Acute upper respiratory infection, unspecified: Secondary | ICD-10-CM

## 2020-04-14 DIAGNOSIS — I25119 Atherosclerotic heart disease of native coronary artery with unspecified angina pectoris: Secondary | ICD-10-CM | POA: Insufficient documentation

## 2020-04-14 DIAGNOSIS — J45909 Unspecified asthma, uncomplicated: Secondary | ICD-10-CM | POA: Diagnosis not present

## 2020-04-14 DIAGNOSIS — Z85820 Personal history of malignant melanoma of skin: Secondary | ICD-10-CM | POA: Diagnosis not present

## 2020-04-14 DIAGNOSIS — B9789 Other viral agents as the cause of diseases classified elsewhere: Secondary | ICD-10-CM | POA: Diagnosis not present

## 2020-04-14 DIAGNOSIS — E039 Hypothyroidism, unspecified: Secondary | ICD-10-CM | POA: Insufficient documentation

## 2020-04-14 DIAGNOSIS — Z79899 Other long term (current) drug therapy: Secondary | ICD-10-CM | POA: Diagnosis not present

## 2020-04-14 DIAGNOSIS — Z96651 Presence of right artificial knee joint: Secondary | ICD-10-CM | POA: Diagnosis not present

## 2020-04-14 DIAGNOSIS — R0600 Dyspnea, unspecified: Secondary | ICD-10-CM | POA: Diagnosis not present

## 2020-04-14 DIAGNOSIS — R059 Cough, unspecified: Secondary | ICD-10-CM | POA: Diagnosis not present

## 2020-04-14 DIAGNOSIS — I10 Essential (primary) hypertension: Secondary | ICD-10-CM | POA: Insufficient documentation

## 2020-04-14 LAB — CBC
HCT: 44.4 % (ref 39.0–52.0)
Hemoglobin: 15.6 g/dL (ref 13.0–17.0)
MCH: 32.4 pg (ref 26.0–34.0)
MCHC: 35.1 g/dL (ref 30.0–36.0)
MCV: 92.3 fL (ref 80.0–100.0)
Platelets: 297 10*3/uL (ref 150–400)
RBC: 4.81 MIL/uL (ref 4.22–5.81)
RDW: 13.4 % (ref 11.5–15.5)
WBC: 7.3 10*3/uL (ref 4.0–10.5)
nRBC: 0 % (ref 0.0–0.2)

## 2020-04-14 LAB — BASIC METABOLIC PANEL
Anion gap: 8 (ref 5–15)
BUN: 20 mg/dL (ref 8–23)
CO2: 27 mmol/L (ref 22–32)
Calcium: 8.6 mg/dL — ABNORMAL LOW (ref 8.9–10.3)
Chloride: 102 mmol/L (ref 98–111)
Creatinine, Ser: 1.37 mg/dL — ABNORMAL HIGH (ref 0.61–1.24)
GFR, Estimated: 50 mL/min — ABNORMAL LOW (ref >60)
Glucose, Bld: 73 mg/dL (ref 70–99)
Potassium: 4.5 mmol/L (ref 3.5–5.1)
Sodium: 137 mmol/L (ref 135–145)

## 2020-04-14 LAB — TROPONIN I (HIGH SENSITIVITY): Troponin I (High Sensitivity): 11 ng/L (ref <18)

## 2020-04-14 NOTE — ED Notes (Signed)
E-signature not working at this time. Pt verbalized understanding of D/C instructions, prescriptions and follow up care with no further questions at this time. Pt in NAD and ambulatory at time of D/C.  

## 2020-04-14 NOTE — ED Provider Notes (Signed)
[facility] Emergency Department Provider Note   ____________________________________________   Event Date/Time   First MD Initiated Contact with Patient 04/14/20 1053     (approximate)  I have reviewed the triage vital signs and the nursing notes.   HISTORY  Chief Complaint Cough and Shortness of Breath    HPI Theodore [name] is a 87 y.o. male with past medical history of hypertension, hyperlipidemia, CAD, PAD, COPD, and essential tremor who presents to the ED complaining of cough and shortness of breath.  Patient reports he initially tested positive for COVID-19 at [facility] on January 12.  He initially felt very bad with fever, cough, shortness of breath, and malaise.  His symptoms seem to improve about 1 week ago but now he continues to have lingering cough and shortness of breath.  He feels like his chest is very congested and he cannot clear all of the mucus.  He has been using nebulized breathing treatments at home with partial relief.  He was also prescribed steroids by his PCP, but admits he did not taper these correctly and went quickly from a high dose to lower dose.  He denies any nausea, vomiting, diarrhea, chest pain, or abdominal pain.        Past Medical History:  Diagnosis Date  . Arthritis    knees  . Asymptomatic Sinus bradycardia    a. 05/2016 Zio: Avg HR 61 (41-167).  . Benign essential tremor   . Cancer [facility]) 2002   melanoma right ear  . Colon polyp   . COPD (chronic obstructive pulmonary disease) ([place])    pt said he believes it is a misdiagnosis   . Coronary artery calcification seen on CT scan    a. 06/2016 CTA Chest: cor Ca2+; b. 05/2017 MV: EF >65%. No ischemia/infarct.  . Depression   . History of echocardiogram    a. 04/2016 Echo: EF 60-65%, mild conc LVH. Nl PASP.  [name] History of kidney stones   . Hx of dysplastic nevus 05/07/2017   L anterior shoulder, severe  . Hypothyroidism   . Hypothyroidism   . Melanoma ([place]) 1990's per pt    R ear  . Melanoma in situ ([place]) 01/12/2014   left jaw  . Palpitations    a. 05/2016 Zio: Avg HR 61 (41-167). 9 SVT runs (fastest 167 - 5 beats; longest 8 beats - 101 bpm). Rare PACs/PVCs.  . PVC's (premature ventricular contractions)   . Rhinitis   . Wears dentures    partial upper and lower  . Wears hearing aid in both ears     Patient Active Problem List   Diagnosis Date Noted  . COVID-19 virus infection 04/06/2020  . Weight gain 09/23/2019  . Acquired trigger finger of left middle finger 08/07/2019  . Carpal tunnel syndrome of right wrist 07/10/2019  . Pain in right hand 06/05/2019  . Ulnar neuropathy 06/05/2019  . Essential tremor 05/28/2019  . Disorder of bursae of shoulder region 01/29/2019  . Localized, primary osteoarthritis of shoulder region 01/29/2019  . Localized, secondary osteoarthritis of the shoulder region 01/29/2019  . Osteoarthritis of knee 01/29/2019  . Shoulder joint pain 01/29/2019  . Chronic obstructive pulmonary disease ([place]) 09/24/2018  . Grief 09/02/2018  . Cervical radiculopathy 08/14/2018  . Neck pain 08/12/2018  . Sinus bradycardia 03/21/2018  . DOE (dyspnea on exertion) 03/21/2018  . PVC (premature ventricular contraction) 03/21/2018  . Weakness of both lower extremities 08/09/2017  . Dysphagia 09/05/2016  . Ringworm, body  06/27/2016  . Weight loss, unintentional 06/03/2016  . Bradycardia 05/10/2016  . Coronary artery disease involving native coronary artery of native heart with angina pectoris ([place])   . PAD (peripheral artery disease) ([place])   . Asthma 05/09/2016  . Anterior knee pain, left 12/26/2015  . Hypothyroidism 07/04/2014  . Hyperlipidemia LDL goal <100 07/04/2014  . Nonallergic vasomotor rhinitis 07/01/2014  . Long-term use of high-risk medication 01/16/2014  . Hypertrophy of prostate with urinary obstruction and other lower urinary tract symptoms (LUTS) 08/08/2013  . Hypogonadism male 08/08/2013  . Vitamin D deficiency  07/31/2013  . Depression with anxiety 04/28/2013  . Low serum testosterone level 04/28/2013  . Actinic keratosis 05/10/2010  . History of malignant melanoma of skin 05/10/2010  . Inflamed seborrheic keratosis 05/10/2010    Past Surgical History:  Procedure Laterality Date  . ADENOIDECTOMY    . CATARACT EXTRACTION W/PHACO Left 10/22/2017   Procedure: CATARACT EXTRACTION PHACO AND INTRAOCULAR LENS PLACEMENT ([place])  LEFT;  Surgeon: Eulogio Bear, MD;  Location: [facility];  Service: Ophthalmology;  Laterality: Left;  . CATARACT EXTRACTION W/PHACO Right 11/20/2017   Procedure: CATARACT EXTRACTION PHACO AND INTRAOCULAR LENS PLACEMENT ([place]) RIGHT;  Surgeon: Eulogio Bear, MD;  Location: [place];  Service: Ophthalmology;  Laterality: Right;  . ESOPHAGOGASTRODUODENOSCOPY (EGD) WITH PROPOFOL N/A 10/31/2016   Procedure: ESOPHAGOGASTRODUODENOSCOPY (EGD) WITH PROPOFOL;  Surgeon: Lollie Sails, MD;  Location[facility] ENDOSCOPY;  Service: Endoscopy;  Laterality: N/A;  . JOINT REPLACEMENT Right 2003   knee  . knee meniscus repair Right   . MELANOMA EXCISION Right    ear. Followed by Dr. Nehemiah Massed  . PATELLECTOMY Bilateral   . TONSILLECTOMY      Prior to Admission medications   Medication Sig Start Date End Date Taking? Authorizing Provider  Alpha-Lipoic Acid 600 MG CAPS Take by mouth.    [provider]  ascorbic Acid (VITAMIN C) 500 MG CPCR Take by mouth.    [provider]  b complex vitamins tablet Take by mouth.    [provider]  Cholecalciferol 125 MCG (5000 UT) TABS Take by mouth.    [provider]  COD LIVER OIL PO Take by mouth.    [provider]  Coenzyme Q10 100 MG capsule Take by mouth.    [provider]  cycloSPORINE (RESTASIS) 0.05 % ophthalmic emulsion Restasis 0.05 % eye drops in a dropperette  INSTILL 1 DROP INTO BOTH EYES TWICE A DAY    [provider]  donepezil (ARICEPT) 5 MG tablet  Take by mouth. 07/03/19   [provider]  ezetimibe (ZETIA) 10 MG tablet Take 1 tablet (10 mg total) by mouth daily. 09/29/19   Minna Merritts, MD  Flaxseed, Linseed, (FLAX SEEDS PO) Take by mouth daily. With omega 3    [provider]  guaiFENesin-codeine (CHERATUSSIN AC) 100-10 MG/5ML syrup Take 5 mLs by mouth 3 (three) times daily as needed for cough. 04/06/20   [name], MD  ipratropium-albuterol (DUONEB) 0.5-2.5 (3) MG/3ML SOLN Take 3 mLs by nebulization every 6 (six) hours as needed. 04/06/20   [name], MD  levothyroxine (SYNTHROID) 100 MCG tablet TAKE 1 TABLET BY MOUTH EVERY DAY BEFORE BREAKFAST 01/05/20   [name], MD  LUTEIN PO Take by mouth.    [provider]  magnesium oxide (MAG-OX) 400 MG tablet Take 400 mg by mouth daily.    [provider]  OVER THE COUNTER MEDICATION Take 4  tablets by mouth daily. Standard Process- Cardio Plus    [provider]  OVER THE COUNTER MEDICATION Take 1 tablet by mouth daily. Cardio Platinum - Vitamin K-2 500 mg, Arginine 300 mg, Natokinase 100 mg    [provider]  OVER THE COUNTER MEDICATION Take by mouth daily. Hemp oil    [provider]  predniSONE (DELTASONE) 10 MG tablet 6 tablets on Day 1 , then reduce by 1 tablet daily until gone 04/06/20   [name], MD  Probiotic Product (PROBIOTIC + TURMERIC EXTRACT) 400 MG CAPS Take by mouth.    [provider]  Probiotic Product (PROBIOTIC ACIDOPHILUS) CAPS Take 1 capsule by mouth daily.    [provider]  S-Adenosylmethionine 400 MG TABS Take by mouth.    [provider]  testosterone cypionate (DEPOTESTOSTERONE CYPIONATE) 200 MG/ML injection INJECT 0.5 ML (100 MG TOTAL) INTO THE MUSCLE EVERY 14 DAYS. 03/01/20   [name], MD  Turmeric 400 MG CAPS Take by mouth.    [provider]    Allergies Penicillins  Family History  Problem Relation Age of Onset  . Hypertension  Mother   . Heart disease Mother        CHF  . Heart disease Father   . Cancer Sister        melanoma    Social History Social History   Tobacco Use  . Smoking status: Never Smoker  . Smokeless tobacco: Never Used  Vaping Use  . Vaping Use: Never used  Substance Use Topics  . Alcohol use: Yes    Comment: 1 glass of wine daily  . Drug use: No    Review of Systems  Constitutional: No fever/chills.  Positive for fatigue. Eyes: No visual changes. ENT: No sore throat. Cardiovascular: Denies chest pain. Respiratory: Positive for cough, congestion, and shortness of breath. Gastrointestinal: No abdominal pain.  No nausea, no vomiting.  No diarrhea.  No constipation. Genitourinary: Negative for dysuria. Musculoskeletal: Negative for back pain. Skin: Negative for rash. Neurological: Negative for headaches, focal weakness or numbness.  ____________________________________________   PHYSICAL EXAM:  VITAL SIGNS: ED Triage Vitals  Enc Vitals Group     BP 04/14/20 1033 (!) 114/56     Pulse Rate 04/14/20 1033 72     Resp 04/14/20 1033 18     Temp 04/14/20 1033 98.2 F (36.8 C)     Temp src --      SpO2 04/14/20 1033 98 %     Weight 04/14/20 1034 181 lb (82.1 kg)     Height 04/14/20 1034 6\' 3"  (1.905 m)     Head Circumference --      Peak Flow --      Pain Score --      Pain Loc --      Pain Edu? --      Excl. in [place]? --     Constitutional: Alert and oriented. Eyes: Conjunctivae are normal. Head: Atraumatic. Nose: No congestion/rhinnorhea. Mouth/Throat: Mucous membranes are moist. Neck: Normal ROM Cardiovascular: Normal rate, regular rhythm. Grossly normal heart sounds.  2+ radial pulses bilaterally. Respiratory: Normal respiratory effort.  No retractions. Lungs CTAB. Gastrointestinal: Soft and nontender. No distention. Genitourinary: deferred Musculoskeletal: No lower extremity tenderness nor edema. Neurologic:  Normal speech and language. No gross focal  neurologic deficits are appreciated. Skin:  Skin is warm, dry and intact. No rash noted. Psychiatric: Mood and affect are normal. Speech and behavior are normal.  ____________________________________________  LABS (all labs ordered are listed, but only abnormal results are displayed)  Labs Reviewed  BASIC METABOLIC PANEL - Abnormal; Notable for the following components:      Result Value   Creatinine, Ser 1.37 (*)    Calcium 8.6 (*)    GFR, Estimated 50 (*)    All other components within normal limits  CBC  TROPONIN I (HIGH SENSITIVITY)  TROPONIN I (HIGH SENSITIVITY)   ____________________________________________  EKG  ED ECG REPORT I, Blake Divine, the attending physician, personally viewed and interpreted this ECG.   Date: 04/14/2020  EKG Time: 10:29  Rate: 65  Rhythm: normal sinus rhythm  Axis: Normal  Intervals:none  ST&T Change: None   PROCEDURES  Procedure(s) performed (including Critical Care):  Procedures   ____________________________________________   INITIAL IMPRESSION / ASSESSMENT AND PLAN / ED COURSE       87 year old male with past medical history of hypertension, hyperlipidemia, CAD, PAD, COPD, and tremor who presents to the ED with ongoing fatigue, cough, congestion, and shortness of breath after being diagnosed with COVID-19 on January 12.  EKG shows no evidence of arrhythmia or ischemia, low suspicion for ACS given lack of chest pain but we will screen troponin.  Chest x-ray reviewed by me and shows no infiltrate, edema, or effusion and I have a low suspicion for superimposed bacterial pneumonia at this time.  Additional labs are pending but if work-up is unremarkable, patient may be dealing with lingering symptoms of COVID-19 with element of steroid withdrawal.  Troponin within normal limits, remainder of labs are unremarkable.  Patient breathing comfortably at this time and is appropriate for discharge home with PCP follow-up.       ____________________________________________   FINAL CLINICAL IMPRESSION(S) / ED DIAGNOSES  Final diagnoses:  Viral URI with cough  COVID-19     ED Discharge Orders    None       Note:  This document was prepared using Dragon voice recognition software and may include unintentional dictation errors.   Blake Divine, MD 04/14/20 1340

## 2020-04-14 NOTE — Telephone Encounter (Signed)
Absolutely agree

## 2020-04-14 NOTE — ED Triage Notes (Signed)
Pt states he tested positive for covid 1/12 and states he is continues to have SOb with congestion and fatigue, pt is ambulatory to triage with no distress noted at this time,

## 2020-04-14 NOTE — Telephone Encounter (Signed)
Patient last seen 02/12/2019.   Patient stated that he tested positive for covid on 03/24/2020. PCP prescribed prednisone and albuterol HFA at that time.  Patient has had very minimum relief in sx.  He is using albuterol BID with mild relief. He reports of increased sob, weakness,shakey, fatigue and productive cough with yellow mucus.  Temp is 96.8. he typically runs 98.6. He has been monitoring his oxygen levels. spo2 is dropping to 88-90% with exertion on roomair.  He does not wear supplemental oxygen. He has had both covid vaccines and booster. I have recommended that he go to the ED for eval based off of sx.  Patient voiced his understanding and had no further questions.   Routing to Dr. Mortimer Fries as an Juluis Rainier

## 2020-05-08 ENCOUNTER — Other Ambulatory Visit: Payer: Self-pay | Admitting: Internal Medicine

## 2020-05-13 ENCOUNTER — Other Ambulatory Visit: Payer: Self-pay

## 2020-05-13 MED ORDER — BUPROPION HCL ER (XL) 150 MG PO TB24: 150.0000 mg | ORAL_TABLET | Freq: Every day | ORAL | 1 refills | Status: DC

## 2020-06-07 ENCOUNTER — Other Ambulatory Visit: Payer: Self-pay | Admitting: Internal Medicine

## 2020-07-05 DIAGNOSIS — M542 Cervicalgia: Secondary | ICD-10-CM | POA: Diagnosis not present

## 2020-07-08 ENCOUNTER — Telehealth: Payer: Self-pay | Admitting: Cardiovascular Disease

## 2020-07-08 NOTE — Telephone Encounter (Signed)
Attempted to reach out to pt x2, LDM on VM (DPR approved), advised to call the office and make an appt to be seen, EKG needed d/t "heart fluttering". Pt has not been seen since July of last year. Will await call back.

## 2020-07-08 NOTE — Telephone Encounter (Signed)
Patient calling in report heart fluttering. No pain however its been going on for a month. No other symptoms  Please advise

## 2020-07-12 DIAGNOSIS — M542 Cervicalgia: Secondary | ICD-10-CM | POA: Diagnosis not present

## 2020-07-12 NOTE — Telephone Encounter (Signed)
Attempted to schedule.  Patient states symptoms have gone for now and are intermittent.   Patient declined to schedule a visit at this time as he is dealing with health issues of wife.    Patient agrees to call back to schedule if needed.

## 2020-07-13 ENCOUNTER — Ambulatory Visit: Payer: PPO | Admitting: Cardiovascular Disease

## 2020-07-14 DIAGNOSIS — M542 Cervicalgia: Secondary | ICD-10-CM | POA: Diagnosis not present

## 2020-07-20 DIAGNOSIS — M542 Cervicalgia: Secondary | ICD-10-CM | POA: Diagnosis not present

## 2020-07-23 DIAGNOSIS — M542 Cervicalgia: Secondary | ICD-10-CM | POA: Diagnosis not present

## 2020-07-28 DIAGNOSIS — M542 Cervicalgia: Secondary | ICD-10-CM | POA: Diagnosis not present

## 2020-08-02 DIAGNOSIS — M542 Cervicalgia: Secondary | ICD-10-CM | POA: Diagnosis not present

## 2020-08-03 ENCOUNTER — Other Ambulatory Visit: Payer: Self-pay

## 2020-08-03 ENCOUNTER — Ambulatory Visit (INDEPENDENT_AMBULATORY_CARE_PROVIDER_SITE_OTHER): Payer: PPO

## 2020-08-03 VITALS — BP 128/77 | HR 77 | Temp 97.1°F | Resp 14 | Ht 75.0 in | Wt 182.2 lb

## 2020-08-03 DIAGNOSIS — Z0000 Encounter for general adult medical examination without abnormal findings: Secondary | ICD-10-CM

## 2020-08-03 NOTE — Progress Notes (Signed)
Subjective:   Theodore [name] is a 87 y.o. male who presents for Medicare Annual/Subsequent preventive examination.  Review of Systems    No ROS.  Medicare Wellness   Cardiac Risk Factors include: advanced age (>68men, >33 women);male gender     Objective:    Today's Vitals   08/03/20 1036  BP: 128/77  Pulse: 77  Resp: 14  Temp: (!) 97.1 F (36.2 C)  SpO2: 100%  Weight: 182 lb 3.2 oz (82.6 kg)   Body mass index is 22.77 kg/m.  Advanced Directives 08/03/2020 04/14/2020 03/27/2019 11/20/2017 10/22/2017 05/29/2017 10/31/2016  Does Patient Have a Medical Advance Directive? Yes No Yes Yes Yes Yes Yes  Type of Paramedic of [place];Living will - [place];Living will [place];Living will [place];Living will [place];Living will [place];Living will  Does patient want to make changes to medical advance directive? No - Patient declined - No - Patient declined - No - Patient declined No - Patient declined -  Copy of [place] in Chart? No - copy requested - No - copy requested Yes Yes No - copy requested No - copy requested  Would patient like information on creating a medical advance directive? - No - Patient declined - - - - -    Current Medications (verified) Outpatient Encounter Medications as of 08/03/2020  Medication Sig  . Alpha-Lipoic Acid 600 MG CAPS Take by mouth.  [name] ascorbic Acid (VITAMIN C) 500 MG CPCR Take by mouth.  [name] b complex vitamins tablet Take by mouth.  [name] buPROPion (WELLBUTRIN XL) 150 MG 24 hr tablet Take 1 tablet (150 mg total) by mouth daily.  . Cholecalciferol 125 MCG (5000 UT) TABS Take by mouth.  . COD LIVER OIL PO Take by mouth.  . Coenzyme Q10 100 MG capsule Take by mouth.  . cycloSPORINE (RESTASIS) 0.05 % ophthalmic emulsion Restasis 0.05 % eye drops in a dropperette  INSTILL 1 DROP INTO BOTH EYES TWICE A DAY  .  donepezil (ARICEPT) 5 MG tablet Take by mouth.  . ezetimibe (ZETIA) 10 MG tablet Take 1 tablet (10 mg total) by mouth daily.  . Flaxseed, Linseed, (FLAX SEEDS PO) Take by mouth daily. With omega 3  . ipratropium-albuterol (DUONEB) 0.5-2.5 (3) MG/3ML SOLN Take 3 mLs by nebulization every 6 (six) hours as needed.  [name] levothyroxine (SYNTHROID) 100 MCG tablet TAKE 1 TABLET BY MOUTH EVERY DAY BEFORE BREAKFAST  . LUTEIN PO Take by mouth.  . magnesium oxide (MAG-OX) 400 MG tablet Take 400 mg by mouth daily.  [name] OVER THE COUNTER MEDICATION Take 4 tablets by mouth daily. Standard Process- Cardio Plus  . OVER THE COUNTER MEDICATION Take 1 tablet by mouth daily. Cardio Platinum - Vitamin K-2 500 mg, Arginine 300 mg, Natokinase 100 mg  . OVER THE COUNTER MEDICATION Take by mouth daily. Hemp oil  . Probiotic Product (PROBIOTIC + TURMERIC EXTRACT) 400 MG CAPS Take by mouth.  . Probiotic Product (PROBIOTIC ACIDOPHILUS) CAPS Take 1 capsule by mouth daily.  . S-Adenosylmethionine 400 MG TABS Take by mouth.  . testosterone cypionate (DEPOTESTOSTERONE CYPIONATE) 200 MG/ML injection INJECT 0.5 ML (100 MG TOTAL) INTO THE MUSCLE EVERY 14 DAYS.  . Turmeric 400 MG CAPS Take by mouth.  . [DISCONTINUED] guaiFENesin-codeine (CHERATUSSIN AC) 100-10 MG/5ML syrup Take 5 mLs by mouth 3 (three) times daily as needed for cough.  . [DISCONTINUED] predniSONE (DELTASONE) 10 MG tablet 6  tablets on Day 1 , then reduce by 1 tablet daily until gone   No facility-administered encounter medications on file as of 08/03/2020.    Allergies (verified) Penicillins   History: Past Medical History:  Diagnosis Date  . Arthritis    knees  . Asymptomatic Sinus bradycardia    a. 05/2016 Zio: Avg HR 61 (41-167).  . Benign essential tremor   . Cancer [facility]) 2002   melanoma right ear  . Colon polyp   . COPD (chronic obstructive pulmonary disease) ([place])    pt said he believes it is a misdiagnosis   . Coronary artery calcification seen on CT  scan    a. 06/2016 CTA Chest: cor Ca2+; b. 05/2017 MV: EF >65%. No ischemia/infarct.  . Depression   . History of echocardiogram    a. 04/2016 Echo: EF 60-65%, mild conc LVH. Nl PASP.  [name] History of kidney stones   . Hx of dysplastic nevus 05/07/2017   L anterior shoulder, severe  . Hypothyroidism   . Hypothyroidism   . Melanoma ([place]) 1990's per pt   R ear  . Melanoma in situ ([place]) 01/12/2014   left jaw  . Palpitations    a. 05/2016 Zio: Avg HR 61 (41-167). 9 SVT runs (fastest 167 - 5 beats; longest 8 beats - 101 bpm). Rare PACs/PVCs.  . PVC's (premature ventricular contractions)   . Rhinitis   . Wears dentures    partial upper and lower  . Wears hearing aid in both ears    Past Surgical History:  Procedure Laterality Date  . ADENOIDECTOMY    . CATARACT EXTRACTION W/PHACO Left 10/22/2017   Procedure: CATARACT EXTRACTION PHACO AND INTRAOCULAR LENS PLACEMENT ([place])  LEFT;  Surgeon: Eulogio Bear, MD;  Location: [place];  Service: Ophthalmology;  Laterality: Left;  . CATARACT EXTRACTION W/PHACO Right 11/20/2017   Procedure: CATARACT EXTRACTION PHACO AND INTRAOCULAR LENS PLACEMENT ([place]) RIGHT;  Surgeon: Eulogio Bear, MD;  Location: [place];  Service: Ophthalmology;  Laterality: Right;  . ESOPHAGOGASTRODUODENOSCOPY (EGD) WITH PROPOFOL N/A 10/31/2016   Procedure: ESOPHAGOGASTRODUODENOSCOPY (EGD) WITH PROPOFOL;  Surgeon: Lollie Sails, MD;  Location: [facility] ENDOSCOPY;  Service: Endoscopy;  Laterality: N/A;  . JOINT REPLACEMENT Right 2003   knee  . knee meniscus repair Right   . MELANOMA EXCISION Right    ear. Followed by Dr. Nehemiah Massed  . PATELLECTOMY Bilateral   . TONSILLECTOMY     Family History  Problem Relation Age of Onset  . Hypertension Mother   . Heart disease Mother        CHF  . Heart disease Father   . Cancer Sister        melanoma   Social History   Socioeconomic History  . Marital status: Married    Spouse name: [name]  . Number of  children: 4  . Years of education: 60  . Highest education level: Not on file  Occupational History  . Occupation: Retired    Comment: Architect -   Tobacco Use  . Smoking status: Never Smoker  . Smokeless tobacco: Never Used  Vaping Use  . Vaping Use: Never used  Substance and Sexual Activity  . Alcohol use: Yes    Comment: 1 glass of wine daily  . Drug use: No  . Sexual activity: Not Currently  Other Topics Concern  . Not on file  Social History Narrative   Mr. Windt grew up in [place], [place]. He attended [employer] in [place]  and obtained his Bachelor's Degree in Economics. 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 He and his wife are living at [facility]. He is very active at [employer] [employer] [facility]. He enjoys music.    Social Determinants of Health   Financial Resource Strain: Low Risk   . Difficulty of Paying Living Expenses: Not hard at all  Food Insecurity: No Food Insecurity  . Worried About Charity fundraiser in the Last Year: Never true  . Ran Out of Food in the Last Year: Never true  Transportation Needs: No Transportation Needs  . Lack of Transportation (Medical): No  . Lack of Transportation (Non-Medical): No  Physical Activity: Insufficiently Active  . Days of Exercise per Week: 4 days  . Minutes of Exercise per Session: 30 min  Stress: No Stress Concern Present  . Feeling of Stress : Only a little  Social Connections: Socially Integrated  . Frequency of Communication with Friends and Family: More than three times a week  . Frequency of Social Gatherings with Friends and Family: Once a week  . Attends Religious Services: 1 to 4 times per year  . Active Member of Clubs or Organizations: Yes  . Attends Archivist Meetings: More than 4 times per year  . Marital Status: Married    Tobacco Counseling Counseling given: Not  Answered   Clinical Intake:  Pre-visit preparation completed: Yes        Diabetes: No  How often do you need to have someone help you when you read instructions, pamphlets, or other written materials from your doctor or pharmacy?: 1 - Never   Interpreter Needed?: No      Activities of Daily Living In your present state of health, do you have any difficulty performing the following activities: 08/03/2020  Hearing? Y  Vision? N  Difficulty concentrating or making decisions? Y  Walking or climbing stairs? N  Dressing or bathing? N  Doing errands, shopping? N  Preparing Food and eating ? N  Using the Toilet? N  In the past six months, have you accidently leaked urine? N  Do you have problems with loss of bowel control? N  Managing your Medications? N  Managing your Finances? N  Housekeeping or managing your Housekeeping? Y  Comment Maid assist as needed  Some recent data might be hidden    Patient Care Team: [name], MD as PCP - General (Internal Medicine) Minna Merritts, MD as PCP - Cardiology (Cardiology)  Indicate any recent Medical Services you may have received from other than [facility] providers in the past year (date may be approximate).     Assessment:   This is a routine wellness examination for [place].  Hearing/Vision screen  Hearing Screening   125Hz  250Hz  500Hz  1000Hz  2000Hz  3000Hz  4000Hz  6000Hz  8000Hz   Right ear:           Left ear:           Comments: Hearing aids  Vision Screening Comments: Visual acuity not assessed, virtual visit.  They have seen their ophthalmologist in the last 12 months.     Dietary issues and exercise activities discussed: Current Exercise Habits: Home exercise routine, Intensity: Mild   Regular diet Good fluid intake  Goals Addressed              This Visit's Progress     Patient Stated   .  Follow up with pcp as needed (pt-stated)        Depression Screen PHQ 2/9 Scores 08/03/2020 04/06/2020 09/24/2018  05/29/2017 04/14/2016 03/30/2015  PHQ - 2 Score 2 0 0 0 0 0  PHQ- 9 Score 6 - 0 - - -    Fall Risk Fall Risk  08/03/2020 04/06/2020 09/23/2019 03/27/2019 02/04/2019  Falls in the past year? 0 0 0 0 0  Comment - - - - Emmi Telephone Survey: data to providers prior to load  Number falls in past yr: 0 - - - -  Injury with Fall? 0 - - - -  Follow up Falls evaluation completed Falls evaluation completed Falls evaluation completed Falls evaluation completed -    FALL RISK PREVENTION PERTAINING TO THE HOME: Handrails I use when climbing stairs? Yes Home free of loose throw rugs in walkways, pet beds, electrical cords, etc? Yes  Adequate lighting in your home to reduce risk of falls? Yes   ASSISTIVE DEVICES UTILIZED TO PREVENT FALLS: Use of a cane, walker or w/c? No  Grab bars in the bathroom? Yes   TIMED UP AND GO: Was the test performed? Yes .  Length of time to ambulate 10 feet: 15 sec.   Gait slow and steady without use of assistive device  Cognitive Function: Patient is alert and oriented x3.  Taking Aricept 5 mg daily  Enjoys painting MMSE declined  MMSE - Mini Mental State Exam 03/30/2015  Orientation to time 5  Orientation to Place 5  Registration 3  Attention/ Calculation 5  Recall 3  Language- name 2 objects 2  Language- repeat 1  Language- follow 3 step command 3  Language- read & follow direction 1  Write a sentence 0  Write a sentence-comments Some difficulty. Tremors.  Copy design 1  Total score 29     6CIT Screen 05/29/2017 04/14/2016  What Year? 0 points 0 points  What month? 0 points 0 points  What time? 0 points 0 points  Count back from 20 0 points 0 points  Months in reverse 0 points 0 points  Repeat phrase 0 points 0 points  Total Score 0 0    Immunizations Immunization History  Administered Date(s) Administered  . Fluad Quad(high Dose 65+) 12/13/2018  . Influenza,inj,Quad PF,6+ Mos 01/14/2014, 01/25/2018  . Influenza-Unspecified 12/11/2012,  12/26/2019  . Moderna Sars-Covid-2 Vaccination 04/25/2019, 05/16/2019, 06/26/2019, 01/17/2020  . Pneumococcal Conjugate-13 01/14/2014, 10/04/2018, 12/13/2018  . Pneumococcal Polysaccharide-23 04/28/2012  . Tdap 08/19/2014  . Zoster 04/28/2010  . Zoster Recombinat (Shingrix) 03/12/2017    Health Maintenance Health Maintenance  Topic Date Due  . INFLUENZA VACCINE  10/11/2020  . TETANUS/TDAP  08/18/2024  . COVID-19 Vaccine  Completed  . PNA vac Low Risk Adult  Completed  . HPV VACCINES  Aged Out   Colorectal cancer screening: No longer required.   Lung Cancer Screening: (Low Dose CT Chest recommended if Age 17-80 years, 30 pack-year currently smoking OR have quit w/in 15years.) does not qualify.   Hepatitis C Screening: does not qualify.   Vision Screening: Recommended annual ophthalmology exams for early detection of glaucoma and other disorders of the eye. Is the patient up to date with their annual eye exam?  Yes   Dental Screening: Recommended annual dental exams for proper oral hygiene. Wears dentures.   Community Resource Referral / Chronic Care Management: CRR required this visit?  No   CCM required this visit?  No      Plan:   Keep  all routine maintenance appointments.   Depression- PHQ- 2 is 2                      PHQ- 9 is 6 Notes followed in counseling as needed. Taking medication as prescribed. Declines additional follow up at this time. Denies harm to self or others. No acute issues to address.   I have personally reviewed and noted the following in the patient's chart:   . Medical and social history . Use of alcohol, tobacco or illicit drugs  . Current medications and supplements including opioid prescriptions. Patient is not currently taking opioid prescriptions. . Functional ability and status . Nutritional status . Physical activity . Advanced directives . List of other physicians . Hospitalizations, surgeries, and ER visits in previous 12  months . Vitals . Screenings to include cognitive, depression, and falls . Referrals and appointments  In addition, I have reviewed and discussed with patient certain preventive protocols, quality metrics, and best practice recommendations. A written personalized care plan for preventive services as well as general preventive health recommendations were provided to patient.     Varney Biles, LPN   [id number]

## 2020-08-03 NOTE — Patient Instructions (Addendum)
[name] , Thank you for taking time to come for your Medicare Wellness Visit. I appreciate your ongoing commitment to your health goals. Please review the following plan we discussed and let me know if I can assist you in the future.   These are the goals we discussed: Goals      Patient Stated   .  Follow up with pcp as needed (pt-stated)       This is a list of the screening recommended for you and due dates:  Health Maintenance  Topic Date Due  . Flu Shot  10/11/2020  . Tetanus Vaccine  08/18/2024  . COVID-19 Vaccine  Completed  . Pneumonia vaccines  Completed  . HPV Vaccine  Aged Out    Immunizations Immunization History  Administered Date(s) Administered  . Fluad Quad(high Dose 65+) 12/13/2018  . Influenza,inj,Quad PF,6+ Mos 01/14/2014, 01/25/2018  . Influenza-Unspecified 12/11/2012, 12/26/2019  . Moderna Sars-Covid-2 Vaccination 04/25/2019, 05/16/2019, 06/26/2019, 01/17/2020  . Pneumococcal Conjugate-13 01/14/2014, 10/04/2018, 12/13/2018  . Pneumococcal Polysaccharide-23 04/28/2012  . Tdap 08/19/2014  . Zoster 04/28/2010  . Zoster Recombinat (Shingrix) 03/12/2017   Advanced directives: End of life planning; Advance aging; Advanced directives discussed.  Copy of current HCPOA/Living Will requested.    Follow up in one year for your annual wellness visit.   Preventive Care 18 Years and Older, Male Preventive care refers to lifestyle choices and visits with your health care provider that can promote health and wellness. What does preventive care include?  A yearly physical exam. This is also called an annual well check.  Dental exams once or twice a year.  Routine eye exams. Ask your health care provider how often you should have your eyes checked.  Personal lifestyle choices, including:  Daily care of your teeth and gums.  Regular physical activity.  Eating a healthy diet.  Avoiding tobacco and drug use.  Limiting alcohol use.  Practicing safe  sex.  Taking low doses of aspirin every day.  Taking vitamin and mineral supplements as recommended by your health care provider. What happens during an annual well check? The services and screenings done by your health care provider during your annual well check will depend on your age, overall health, lifestyle risk factors, and family history of disease. Counseling  Your health care provider may ask you questions about your:  Alcohol use.  Tobacco use.  Drug use.  Emotional well-being.  Home and relationship well-being.  Sexual activity.  Eating habits.  History of falls.  Memory and ability to understand (cognition).  Work and work Statistician. Screening  You may have the following tests or measurements:  Height, weight, and BMI.  Blood pressure.  Lipid and cholesterol levels. These may be checked every 5 years, or more frequently if you are over 4 years old.  Skin check.  Lung cancer screening. You may have this screening every year starting at age 97 if you have a 30-pack-year history of smoking and currently smoke or have quit within the past 15 years.  Fecal occult blood test (FOBT) of the stool. You may have this test every year starting at age 30.  Flexible sigmoidoscopy or colonoscopy. You may have a sigmoidoscopy every 5 years or a colonoscopy every 10 years starting at age 37.  Prostate cancer screening. Recommendations will vary depending on your family history and other risks.  Hepatitis C blood test.  Hepatitis B blood test.  Sexually transmitted disease (STD) testing.  Diabetes screening. This is done by checking  your blood sugar (glucose) after you have not eaten for a while (fasting). You may have this done every 1-3 years.  Abdominal aortic aneurysm (AAA) screening. You may need this if you are a current or former smoker.  Osteoporosis. You may be screened starting at age 4 if you are at high risk. Talk with your health care provider  about your test results, treatment options, and if necessary, the need for more tests. Vaccines  Your health care provider may recommend certain vaccines, such as:  Influenza vaccine. This is recommended every year.  Tetanus, diphtheria, and acellular pertussis (Tdap, Td) vaccine. You may need a Td booster every 10 years.  Zoster vaccine. You may need this after age 58.  Pneumococcal 13-valent conjugate (PCV13) vaccine. One dose is recommended after age 77.  Pneumococcal polysaccharide (PPSV23) vaccine. One dose is recommended after age 31. Talk to your health care provider about which screenings and vaccines you need and how often you need them. This information is not intended to replace advice given to you by your health care provider. Make sure you discuss any questions you have with your health care provider. Document Released: 03/26/2015 Document Revised: 11/17/2015 Document Reviewed: 12/29/2014 Elsevier Interactive Patient Education  2017 [place] Prevention in the Home Falls can cause injuries. They can happen to people of all ages. There are many things you can do to make your home safe and to help prevent falls. What can I do on the outside of my home?  Regularly fix the edges of walkways and driveways and fix any cracks.  Remove anything that might make you trip as you walk through a door, such as a raised step or threshold.  Trim any bushes or trees on the path to your home.  Use bright outdoor lighting.  Clear any walking paths of anything that might make someone trip, such as rocks or tools.  Regularly check to see if handrails are loose or broken. Make sure that both sides of any steps have handrails.  Any raised decks and porches should have guardrails on the edges.  Have any leaves, snow, or ice cleared regularly.  Use sand or salt on walking paths during winter.  Clean up any spills in your garage right away. This includes oil or grease  spills. What can I do in the bathroom?  Use night lights.  Install grab bars by the toilet and in the tub and shower. Do not use towel bars as grab bars.  Use non-skid mats or decals in the tub or shower.  If you need to sit down in the shower, use a plastic, non-slip stool.  Keep the floor dry. Clean up any water that spills on the floor as soon as it happens.  Remove soap buildup in the tub or shower regularly.  Attach bath mats securely with double-sided non-slip rug tape.  Do not have throw rugs and other things on the floor that can make you trip. What can I do in the bedroom?  Use night lights.  Make sure that you have a light by your bed that is easy to reach.  Do not use any sheets or blankets that are too big for your bed. They should not hang down onto the floor.  Have a firm chair that has side arms. You can use this for support while you get dressed.  Do not have throw rugs and other things on the floor that can make you trip. What can I do  in the kitchen?  Clean up any spills right away.  Avoid walking on wet floors.  Keep items that you use a lot in easy-to-reach places.  If you need to reach something above you, use a strong step stool that has a grab bar.  Keep electrical cords out of the way.  Do not use floor polish or wax that makes floors slippery. If you must use wax, use non-skid floor wax.  Do not have throw rugs and other things on the floor that can make you trip. What can I do with my stairs?  Do not leave any items on the stairs.  Make sure that there are handrails on both sides of the stairs and use them. Fix handrails that are broken or loose. Make sure that handrails are as long as the stairways.  Check any carpeting to make sure that it is firmly attached to the stairs. Fix any carpet that is loose or worn.  Avoid having throw rugs at the top or bottom of the stairs. If you do have throw rugs, attach them to the floor with carpet  tape.  Make sure that you have a light switch at the top of the stairs and the bottom of the stairs. If you do not have them, ask someone to add them for you. What else can I do to help prevent falls?  Wear shoes that:  Do not have high heels.  Have rubber bottoms.  Are comfortable and fit you well.  Are closed at the toe. Do not wear sandals.  If you use a stepladder:  Make sure that it is fully opened. Do not climb a closed stepladder.  Make sure that both sides of the stepladder are locked into place.  Ask someone to hold it for you, if possible.  Clearly mark and make sure that you can see:  Any grab bars or handrails.  First and last steps.  Where the edge of each step is.  Use tools that help you move around (mobility aids) if they are needed. These include:  Canes.  Walkers.  Scooters.  Crutches.  Turn on the lights when you go into a dark area. Replace any light bulbs as soon as they burn out.  Set up your furniture so you have a clear path. Avoid moving your furniture around.  If any of your floors are uneven, fix them.  If there are any pets around you, be aware of where they are.  Review your medicines with your doctor. Some medicines can make you feel dizzy. This can increase your chance of falling. Ask your doctor what other things that you can do to help prevent falls. This information is not intended to replace advice given to you by your health care provider. Make sure you discuss any questions you have with your health care provider. Document Released: 12/24/2008 Document Revised: 08/05/2015 Document Reviewed: 04/03/2014 Elsevier Interactive Patient Education  2017 [employer].

## 2020-08-04 DIAGNOSIS — M542 Cervicalgia: Secondary | ICD-10-CM | POA: Diagnosis not present

## 2020-08-10 DIAGNOSIS — M65332 Trigger finger, left middle finger: Secondary | ICD-10-CM | POA: Diagnosis not present

## 2020-08-10 DIAGNOSIS — G5602 Carpal tunnel syndrome, left upper limb: Secondary | ICD-10-CM | POA: Diagnosis not present

## 2020-08-10 DIAGNOSIS — G5601 Carpal tunnel syndrome, right upper limb: Secondary | ICD-10-CM | POA: Diagnosis not present

## 2020-08-17 ENCOUNTER — Other Ambulatory Visit: Payer: Self-pay | Admitting: Internal Medicine

## 2020-08-23 ENCOUNTER — Other Ambulatory Visit: Payer: Self-pay | Admitting: Internal Medicine

## 2020-08-25 ENCOUNTER — Encounter: Payer: Self-pay | Admitting: Cardiovascular Disease

## 2020-08-25 ENCOUNTER — Ambulatory Visit (INDEPENDENT_AMBULATORY_CARE_PROVIDER_SITE_OTHER): Payer: PPO | Admitting: Cardiovascular Disease

## 2020-08-25 ENCOUNTER — Other Ambulatory Visit: Payer: Self-pay

## 2020-08-25 VITALS — BP 130/60 | HR 53 | Ht 75.0 in | Wt 178.0 lb

## 2020-08-25 DIAGNOSIS — R001 Bradycardia, unspecified: Secondary | ICD-10-CM | POA: Diagnosis not present

## 2020-08-25 DIAGNOSIS — I739 Peripheral vascular disease, unspecified: Secondary | ICD-10-CM

## 2020-08-25 DIAGNOSIS — I493 Ventricular premature depolarization: Secondary | ICD-10-CM | POA: Diagnosis not present

## 2020-08-25 MED ORDER — EZETIMIBE 10 MG PO TABS: 10.0000 mg | ORAL_TABLET | Freq: Every day | ORAL | 3 refills | Status: DC

## 2020-08-25 NOTE — Patient Instructions (Addendum)
Medication Instructions:  No changes  If you need a refill on your cardiac medications before your next appointment, please call your pharmacy.   Lab work: No new labs needed  Testing/Procedures: No new testing needed  Follow-Up:  You will need a follow up appointment in 12 months  Providers on your designated Care Team:   Christopher Berge, NP Ryan Dunn, PA-C Jacquelyn Visser, PA-C Cadence Furth, PA-C   COVID-19 Vaccine Information can be found at: [email or web address] For questions related to vaccine distribution or appointments, please email [email or web address] or call [phone].      

## 2020-08-25 NOTE — Progress Notes (Signed)
Cardiology Office Note  Date:  08/25/2020   ID:  Theodore [name], DOB [birth year], MRN [id number]  PCP:  [name], MD   Chief Complaint  Patient presents with   Heart fluttering    Patient c/o fluttering in chest that comes and goes; symptoms for a couple of weeks. Medications reviewed by the patient verbally.     HPI:  87 yo gentleman who lives at [facility] with notes indicating a history of asymptomatic bradycardia,  prior history of depression,  who presented to the hospital with episodes of shortness of breath, lightheadedness, fatigue 05/10/2016 COPD  Who presents for follow-up of his sinus bradycardia and shortness of breath  In follow-up today reports doing well Sometimes when he lays down appreciates fluttering in the chest, short-lived, does not bother him much  Some stressors at home, wife with medical issues, he is taking care of her  Reviewed prior monitor from 2018 showing PVCs, short runs of narrow complex tachycardia Has baseline bradycardia  Denies any chest pain concerning for angina, no claudication symptoms  Place harmonica, active, Prior history chronic shortness of breath  Previous imaging reviewed Echo,   1. Left ventricular ejection fraction, by estimation, is 55 to 60%. The  left ventricle has normal function. The left ventricle has no regional  wall motion abnormalities. Left ventricular diastolic parameters were  normal.   2. Right ventricular systolic function is normal. The right ventricular  size is normal.   Previously declined cholesterol medication CT scan performed previously with aortic atherosclerosis ,coronary calcification ABI studies :moderate bilateral disease  EKG personally reviewed by myself on todays visit Shows sinus bradycardia rate 53 bpm no significant ST-T wave changes  Other past medical history reviewed Cardiopulmonary stress testing was ordered 2019  gas exchange demonstrates excellent functional capacity no  evidence of cardiopulmonary limitations.  At peak exercise, patient approached ventilatory limits and is likely hyperventilating and shown with elevated VE/VCO2 slope.  EKG personally reviewed by myself on todays visit Shows sinus bradycardia rate 53 bpm no significant ST-T wave changes  Other past medical history reviewed Prior history of depression October 2017,  Weight loss with anorexia  CT scan done in the hospital showing no significant PE contribute to shortness of breath  shows three-vessel coronary calcifications at least moderate in severity, aortic arch calcification mild to moderate   PMH:   has a past medical history of Arthritis, Asymptomatic Sinus bradycardia, Benign essential tremor, Cancer ([place]) (2002), Colon polyp, COPD (chronic obstructive pulmonary disease) ([place]), Coronary artery calcification seen on CT scan, Depression, History of echocardiogram, History of kidney stones, dysplastic nevus (05/07/2017), Hypothyroidism, Hypothyroidism, Melanoma ([place]) (1990's per pt), Melanoma in situ ([place]) (01/12/2014), Palpitations, PVC's (premature ventricular contractions), Rhinitis, Wears dentures, and Wears hearing aid in both ears.  PSH:    Past Surgical History:  Procedure Laterality Date   ADENOIDECTOMY     CATARACT EXTRACTION W/PHACO Left 10/22/2017   Procedure: CATARACT EXTRACTION PHACO AND INTRAOCULAR LENS PLACEMENT ([place])  LEFT;  Surgeon: Eulogio Bear, MD;  Location: [facility];  Service: Ophthalmology;  Laterality: Left;   CATARACT EXTRACTION W/PHACO Right 11/20/2017   Procedure: CATARACT EXTRACTION PHACO AND INTRAOCULAR LENS PLACEMENT ([facility]) RIGHT;  Surgeon: Eulogio Bear, MD;  Location: [facility];  Service: Ophthalmology;  Laterality: Right;   ESOPHAGOGASTRODUODENOSCOPY (EGD) WITH PROPOFOL N/A 10/31/2016   Procedure: ESOPHAGOGASTRODUODENOSCOPY (EGD) WITH PROPOFOL;  Surgeon: Lollie Sails, MD;  Location: [facility] ENDOSCOPY;  Service: Endoscopy;   Laterality: N/A;  JOINT REPLACEMENT Right 2003   knee   knee meniscus repair Right    MELANOMA EXCISION Right    ear. Followed by Dr. Nehemiah Massed   PATELLECTOMY Bilateral    TONSILLECTOMY      Current Outpatient Medications  Medication Sig Dispense Refill   Alpha-Lipoic Acid 600 MG CAPS Take by mouth.     ascorbic Acid (VITAMIN C) 500 MG CPCR Take by mouth.     b complex vitamins tablet Take by mouth.     buPROPion (WELLBUTRIN XL) 150 MG 24 hr tablet Take 1 tablet (150 mg total) by mouth daily. 90 tablet 1   Cholecalciferol 125 MCG (5000 UT) TABS Take by mouth.     COD LIVER OIL PO Take by mouth.     Coenzyme Q10 100 MG capsule Take by mouth.     cycloSPORINE (RESTASIS) 0.05 % ophthalmic emulsion Restasis 0.05 % eye drops in a dropperette  INSTILL 1 DROP INTO BOTH EYES TWICE A DAY     donepezil (ARICEPT) 5 MG tablet Take by mouth.     ezetimibe (ZETIA) 10 MG tablet Take 1 tablet (10 mg total) by mouth daily. 90 tablet 3   Flaxseed, Linseed, (FLAX SEEDS PO) Take by mouth daily. With omega 3     ipratropium-albuterol (DUONEB) 0.5-2.5 (3) MG/3ML SOLN Take 3 mLs by nebulization every 6 (six) hours as needed. 360 mL 0   levothyroxine (SYNTHROID) 100 MCG tablet TAKE 1 TABLET BY MOUTH EVERY DAY BEFORE BREAKFAST 90 tablet 0   LUTEIN PO Take by mouth.     magnesium oxide (MAG-OX) 400 MG tablet Take 400 mg by mouth daily.     OVER THE COUNTER MEDICATION Take 4 tablets by mouth daily. Standard Process- Cardio Plus     OVER THE COUNTER MEDICATION Take 1 tablet by mouth daily. Cardio Platinum - Vitamin K-2 500 mg, Arginine 300 mg, Natokinase 100 mg     OVER THE COUNTER MEDICATION Take by mouth daily. Hemp oil     Probiotic Product (PROBIOTIC + TURMERIC EXTRACT) 400 MG CAPS Take by mouth.     Probiotic Product (PROBIOTIC ACIDOPHILUS) CAPS Take 1 capsule by mouth daily.     S-Adenosylmethionine 400 MG TABS Take by mouth.     testosterone cypionate (DEPOTESTOSTERONE CYPIONATE) 200 MG/ML injection  INJECT 0.5 ML (100 MG TOTAL) INTO THE MUSCLE EVERY 14 DAYS. 1 mL 5   Turmeric 400 MG CAPS Take by mouth.     No current facility-administered medications for this visit.     Allergies:   Penicillins   Social History:  The patient  reports that he has never smoked. He has never used smokeless tobacco. He reports current alcohol use. He reports that he does not use drugs.   Family History:   family history includes Cancer in his sister; Heart disease in his father and mother; Hypertension in his mother.    Review of Systems: Review of Systems  Constitutional: Negative.   Respiratory: Negative.    Cardiovascular: Negative.   Gastrointestinal: Negative.   Musculoskeletal: Negative.   Neurological: Negative.   Psychiatric/Behavioral: Negative.    All other systems reviewed and are negative.  PHYSICAL EXAM: Vitals:   08/25/20 1121  BP: 130/60  Pulse: (!) 53  SpO2: 96%   Body mass index is 22.25 kg/m.  Vitals:   08/25/20 1121  Weight: 178 lb (80.7 kg)  Height: 6\' 3"  (1.905 m)   Constitutional:  oriented to person, place, and time. No distress.  HENT:  Head: Grossly normal Eyes:  no discharge. No scleral icterus.  Neck: No JVD, no carotid bruits  Cardiovascular: Regular rate and rhythm, no murmurs appreciated Pulmonary/Chest: Clear to auscultation bilaterally, no wheezes or rails Abdominal: Soft.  no distension.  no tenderness.  Musculoskeletal: Normal range of motion Neurological:  normal muscle tone. Coordination normal. No atrophy Skin: Skin warm and dry Psychiatric: normal affect, pleasant   Recent Labs: 09/23/2019: ALT 18; TSH 1.39 04/14/2020: BUN 20; Creatinine, Ser 1.37; Hemoglobin 15.6; Platelets 297; Potassium 4.5; Sodium 137    Lipid Panel Lab Results  Component Value Date   CHOL 218 (H) 08/22/2019   HDL 63.30 08/22/2019   LDLCALC 131 (H) 08/22/2019   TRIG 118.0 08/22/2019      Wt Readings from Last 3 Encounters:  08/25/20 178 lb (80.7 kg)  08/03/20  182 lb 3.2 oz (82.6 kg)  04/14/20 181 lb (82.1 kg)       ASSESSMENT AND PLAN:  Bradycardia - Plan: EKG 12-Lead Asymptomatic bradycardia, rate in the 50s Unchanged, we will not use beta-blockers for any of his other arrhythmia as he is not very symptomatic  Depression with anxiety Managed by primary care Stressors at home, takes care of wife who has medical issues  Aortic atherosclerosis ([place]) Zetia refilled Does not want a statin  COPD Managed by pulmonary, on inhalers Chronic shortness of breath,  Regular walking program  Coronary artery disease involving native coronary artery of native heart with angina pectoris ([place]) Currently with no symptoms of angina. No further workup at this time. Continue current medication regimen. Previously declined statin  PAD Mild to moderate aortic atherosclerosis seen on prior imaging He does not want a statin On Zetia  Essential tremor Prior successful procedure Stable      Total encounter time more than 25 minutes  Greater than 50% was spent in counseling and coordination of care with the patient    No orders of the defined types were placed in this encounter.    Signed, Esmond Plants, M.D., Ph.D. 08/25/2020  [place], [place]

## 2020-09-03 ENCOUNTER — Other Ambulatory Visit: Payer: Self-pay | Admitting: Internal Medicine

## 2020-09-06 ENCOUNTER — Ambulatory Visit: Payer: PPO | Admitting: Dermatology

## 2020-09-06 ENCOUNTER — Other Ambulatory Visit: Payer: Self-pay

## 2020-09-06 DIAGNOSIS — L814 Other melanin hyperpigmentation: Secondary | ICD-10-CM | POA: Diagnosis not present

## 2020-09-06 DIAGNOSIS — L820 Inflamed seborrheic keratosis: Secondary | ICD-10-CM

## 2020-09-06 DIAGNOSIS — Z86018 Personal history of other benign neoplasm: Secondary | ICD-10-CM | POA: Diagnosis not present

## 2020-09-06 DIAGNOSIS — D225 Melanocytic nevi of trunk: Secondary | ICD-10-CM | POA: Diagnosis not present

## 2020-09-06 DIAGNOSIS — D1800 Hemangioma unspecified site: Secondary | ICD-10-CM | POA: Diagnosis not present

## 2020-09-06 DIAGNOSIS — Z1283 Encounter for screening for malignant neoplasm of skin: Secondary | ICD-10-CM

## 2020-09-06 DIAGNOSIS — L578 Other skin changes due to chronic exposure to nonionizing radiation: Secondary | ICD-10-CM | POA: Diagnosis not present

## 2020-09-06 DIAGNOSIS — L300 Nummular dermatitis: Secondary | ICD-10-CM

## 2020-09-06 DIAGNOSIS — L720 Epidermal cyst: Secondary | ICD-10-CM | POA: Diagnosis not present

## 2020-09-06 DIAGNOSIS — D229 Melanocytic nevi, unspecified: Secondary | ICD-10-CM

## 2020-09-06 DIAGNOSIS — D485 Neoplasm of uncertain behavior of skin: Secondary | ICD-10-CM

## 2020-09-06 DIAGNOSIS — Z86006 Personal history of melanoma in-situ: Secondary | ICD-10-CM

## 2020-09-06 DIAGNOSIS — L821 Other seborrheic keratosis: Secondary | ICD-10-CM

## 2020-09-06 NOTE — Progress Notes (Signed)
Follow-Up Visit   Subjective  Theodore [name] is a 87 y.o. male who presents for the following: UBSE (Patient here for upper body skin exam and skin cancer screening. Patient with hx of Mmis and dysplastic nevi. He has a spot at right neck that it tender, a spot at left neck and a new spot at left forearm. ) and Rash (Small rash at right upper thigh that itches, present for about 3 weeks. ).    The following portions of the chart were reviewed this encounter and updated as appropriate:        Review of Systems:  No other skin or systemic complaints except as noted in HPI or Assessment and Plan.  Objective  Well appearing patient in no apparent distress; mood and affect are within normal limits.  All skin waist up examined.  R neck, L forearm (2) Erythematous keratotic or waxy stuck-on papule   Left Upper Back Firm subcutaneous nodule.   Mid frontal scalp 2cm waxy tan patch  right lower sternum 38mm medium dark brown macule with irregular pigment        Right Anterior Thigh Pink scaly patch   Assessment & Plan  Inflamed seborrheic keratosis R neck, L forearm  Destruction of lesion - R neck, L forearm  Destruction method: cryotherapy   Informed consent: discussed and consent obtained   Lesion destroyed using liquid nitrogen: Yes   Region frozen until ice ball extended beyond lesion: Yes   Outcome: patient tolerated procedure well with no complications   Post-procedure details: wound care instructions given   Additional details:  Prior to procedure, discussed risks of blister formation, small wound, skin dyspigmentation, or rare scar following cryotherapy. Recommend Vaseline ointment to treated areas while healing.   Epidermal inclusion cyst Left Upper Back  Benign-appearing.  Observation.  Call clinic for new or changing lesions.  Recommend daily use of broad spectrum spf 30+ sunscreen to sun-exposed areas.    Seborrheic keratosis Mid frontal  scalp  Photo compared, stable Biopsy proven  Benign-appearing.  Observation.  Call clinic for new or changing lesions.  Recommend daily use of broad spectrum spf 30+ sunscreen to sun-exposed areas.    Neoplasm of uncertain behavior of skin right lower sternum  Epidermal / dermal shaving  Lesion diameter (cm):  0.6 Informed consent: discussed and consent obtained   Patient was prepped and draped in usual sterile fashion: Area prepped with alcohol. Anesthesia: the lesion was anesthetized in a standard fashion   Local anesthetic: 0.5% bupivicaine. Instrument used: flexible razor blade   Hemostasis achieved with: pressure, aluminum chloride and electrodesiccation   Outcome: patient tolerated procedure well   Post-procedure details: wound care instructions given   Post-procedure details comment:  Ointment and small bandage applied.   Specimen 1 - Surgical pathology Differential Diagnosis: SK r/o Atypia  Check Margins: No 17mm medium dark brown macule with irregular pigment   Nummular dermatitis Right Anterior Thigh  Start mometasone cream to affected area twice daily as needed for itch. Avoid applying to face, groin, and axilla. Use as directed. Risk of skin atrophy with long-term use reviewed.  Patient has.  Topical steroids (such as triamcinolone, fluocinolone, fluocinonide, mometasone, clobetasol, halobetasol, betamethasone, hydrocortisone) can cause thinning and lightening of the skin if they are used for too long in the same area. Your physician has selected the right strength medicine for your problem and area affected on the body. Please use your medication only as directed by your physician to prevent side  effects.    Lentigines - Scattered tan macules - Due to sun exposure - Benign-appering, observe - Recommend daily broad spectrum sunscreen SPF 30+ to sun-exposed areas, reapply every 2 hours as needed. - Call for any changes  Seborrheic Keratoses - Stuck-on, waxy,  tan-brown papules and/or plaques  - Benign-appearing - Discussed benign etiology and prognosis. - Observe - Call for any changes  Melanocytic Nevi - Tan-brown and/or pink-flesh-colored symmetric macules and papules - Benign appearing on exam today - Observation - Call clinic for new or changing moles - Recommend daily use of broad spectrum spf 30+ sunscreen to sun-exposed areas.   Hemangiomas - Red papules - Discussed benign nature - Observe - Call for any changes  Actinic Damage - Chronic condition, secondary to cumulative UV/sun exposure - diffuse scaly erythematous macules with underlying dyspigmentation - Recommend daily broad spectrum sunscreen SPF 30+ to sun-exposed areas, reapply every 2 hours as needed.  - Staying in the shade or wearing long sleeves, sun glasses (UVA+UVB protection) and wide brim hats (4-inch brim around the entire circumference of the hat) are also recommended for sun protection.  - Call for new or changing lesions.  Skin cancer screening performed today.  History of Dysplastic Nevi (2019) - No evidence of recurrence today at left anterior shoulder - Recommend regular full body skin exams - Recommend daily broad spectrum sunscreen SPF 30+ to sun-exposed areas, reapply every 2 hours as needed.  - Call if any new or changing lesions are noted between office visits  History of Melanoma in Situ (2015) - No evidence of recurrence today at left jaw, right ear - Recommend regular full body skin exams - Recommend daily broad spectrum sunscreen SPF 30+ to sun-exposed areas, reapply every 2 hours as needed.  - Call if any new or changing lesions are noted between office visits  Return in about 6 months (around 03/08/2021) for UBSE.  [name], RMA, am acting as scribe for [name], MD .  Documentation: I have reviewed the above documentation for accuracy and completeness, and I agree with the above.  [name] MD

## 2020-09-06 NOTE — Patient Instructions (Addendum)
Cryotherapy Aftercare  Wash gently with soap and water everyday.   Apply Vaseline and Band-Aid daily until healed.   Start mometasone cream to affected area at right thigh for eczema twice daily as needed for itch. Avoid applying to face, groin, and axilla. Use as directed. Risk of skin atrophy with long-term use reviewed.   Topical steroids (such as triamcinolone, fluocinolone, fluocinonide, mometasone, clobetasol, halobetasol, betamethasone, hydrocortisone) can cause thinning and lightening of the skin if they are used for too long in the same area. Your physician has selected the right strength medicine for your problem and area affected on the body. Please use your medication only as directed by your physician to prevent side effects.   Wound Care Instructions  Cleanse wound gently with soap and water once a day then pat dry with clean gauze. Apply a thing coat of Petrolatum (petroleum jelly, "Vaseline") over the wound (unless you have an allergy to this). We recommend that you use a new, sterile tube of Vaseline. Do not pick or remove scabs. Do not remove the yellow or white "healing tissue" from the base of the wound.  Cover the wound with fresh, clean, nonstick gauze and secure with paper tape. You may use Band-Aids in place of gauze and tape if the would is small enough, but would recommend trimming much of the tape off as there is often too much. Sometimes Band-Aids can irritate the skin.  You should call the office for your biopsy report after 1 week if you have not already been contacted.  If you experience any problems, such as abnormal amounts of bleeding, swelling, significant bruising, significant pain, or evidence of infection, please call the office immediately.  FOR ADULT SURGERY PATIENTS: If you need something for pain relief you may take 1 extra strength Tylenol (acetaminophen) AND 2 Ibuprofen (200mg  each) together every 4 hours as needed for pain. (do not take these if you are  allergic to them or if you have a reason you should not take them.) Typically, you may only need pain medication for 1 to 3 days.   Melanoma ABCDEs  Melanoma is the most dangerous type of skin cancer, and is the leading cause of death from skin disease.  You are more likely to develop melanoma if you: Have light-colored skin, light-colored eyes, or red or blond hair Spend a lot of time in the sun Tan regularly, either outdoors or in a tanning bed Have had blistering sunburns, especially during childhood Have a close family member who has had a melanoma Have atypical moles or large birthmarks  Early detection of melanoma is key since treatment is typically straightforward and cure rates are extremely high if we catch it early.   The first sign of melanoma is often a change in a mole or a new dark spot.  The ABCDE system is a way of remembering the signs of melanoma.  A for asymmetry:  The two halves do not match. B for border:  The edges of the growth are irregular. C for color:  A mixture of colors are present instead of an even brown color. D for diameter:  Melanomas are usually (but not always) greater than 68mm - the size of a pencil eraser. E for evolution:  The spot keeps changing in size, shape, and color.  Please check your skin once per month between visits. You can use a small mirror in front and a large mirror behind you to keep an eye on the back side or  your body.   If you see any new or changing lesions before your next follow-up, please call to schedule a visit.  Please continue daily skin protection including broad spectrum sunscreen SPF 30+ to sun-exposed areas, reapplying every 2 hours as needed when you're outdoors.    If you have any questions or concerns for your doctor, please call our main line at [phone] and press option 4 to reach your doctor's medical assistant. If no one answers, please leave a voicemail as directed and we will return your call as soon as  possible. Messages left after 4 pm will be answered the following business day.   You may also send [place] a message via [place]. We typically respond to MyChart messages within 1-2 business days.  For prescription refills, please ask your pharmacy to contact our office. Our fax number is [phone].  If you have an urgent issue when the clinic is closed that cannot wait until the next business day, you can page your doctor at the number below.    Please note that while we do our best to be available for urgent issues outside of office hours, we are not available 24/7.   If you have an urgent issue and are unable to reach [place], you may choose to seek medical care at your doctor's office, retail clinic, urgent care center, or emergency room.  If you have a medical emergency, please immediately call 911 or go to the emergency department.  Pager Numbers  - Dr. Nehemiah Massed: [phone]  - Dr. Laurence Ferrari: [phone]  - Dr. Nicole Kindred: [phone]  In the event of inclement weather, please call our main line at [phone] for an update on the status of any delays or closures.  Dermatology Medication Tips: Please keep the boxes that topical medications come in in order to help keep track of the instructions about where and how to use these. Pharmacies typically print the medication instructions only on the boxes and not directly on the medication tubes.   If your medication is too expensive, please contact our office at [phone] option 4 or send [place] a message through [place].   We are unable to tell what your co-pay for medications will be in advance as this is different depending on your insurance coverage. However, we may be able to find a substitute medication at lower cost or fill out paperwork to get insurance to cover a needed medication.   If a prior authorization is required to get your medication covered by your insurance company, please allow [place] 1-2 business days to complete this  process.  Drug prices often vary depending on where the prescription is filled and some pharmacies may offer cheaper prices.  The website www.goodrx.com contains coupons for medications through different pharmacies. The prices here do not account for what the cost may be with help from insurance (it may be cheaper with your insurance), but the website can give you the price if you did not use any insurance.  - You can print the associated coupon and take it with your prescription to the pharmacy.  - You may also stop by our office during regular business hours and pick up a GoodRx coupon card.  - If you need your prescription sent electronically to a different pharmacy, notify our office through [facility] or by phone at [phone] option 4.

## 2020-09-10 ENCOUNTER — Other Ambulatory Visit: Payer: Self-pay | Admitting: Internal Medicine

## 2020-09-10 NOTE — Telephone Encounter (Signed)
Pt overdue for recheck on Testosterone

## 2020-09-10 NOTE — Telephone Encounter (Signed)
PLEASE SCHEDULE LAB APPT ASAP

## 2020-09-14 ENCOUNTER — Telehealth: Payer: Self-pay

## 2020-09-14 NOTE — Telephone Encounter (Signed)
-----   Message from [name], MD sent at 09/11/2020  5:57 PM EDT ----- Skin , right lower sternum DYSPLASTIC JUNCTIONAL LENTIGINOUS NEVUS WITH MODERATE ATYPIA, LIMITED MARGINS FREE  Moderately atypical mole, will observe for recurrence - please call patient

## 2020-09-14 NOTE — Telephone Encounter (Signed)
Advised pt of pathology results/sh 

## 2020-09-15 NOTE — Telephone Encounter (Signed)
Pt has an appt tomorrow with Dr. Derrel Nip.

## 2020-09-16 ENCOUNTER — Encounter: Payer: Self-pay | Admitting: Internal Medicine

## 2020-09-16 ENCOUNTER — Other Ambulatory Visit: Payer: Self-pay

## 2020-09-16 ENCOUNTER — Ambulatory Visit (INDEPENDENT_AMBULATORY_CARE_PROVIDER_SITE_OTHER): Payer: PPO | Admitting: Internal Medicine

## 2020-09-16 VITALS — BP 126/66 | HR 66 | Temp 95.3°F | Resp 16 | Ht 75.0 in | Wt 179.4 lb

## 2020-09-16 DIAGNOSIS — J449 Chronic obstructive pulmonary disease, unspecified: Secondary | ICD-10-CM

## 2020-09-16 DIAGNOSIS — R7989 Other specified abnormal findings of blood chemistry: Secondary | ICD-10-CM

## 2020-09-16 DIAGNOSIS — E291 Testicular hypofunction: Secondary | ICD-10-CM | POA: Diagnosis not present

## 2020-09-16 DIAGNOSIS — Z79899 Other long term (current) drug therapy: Secondary | ICD-10-CM

## 2020-09-16 DIAGNOSIS — J300 Vasomotor rhinitis: Secondary | ICD-10-CM

## 2020-09-16 DIAGNOSIS — F418 Other specified anxiety disorders: Secondary | ICD-10-CM | POA: Diagnosis not present

## 2020-09-16 DIAGNOSIS — R29898 Other symptoms and signs involving the musculoskeletal system: Secondary | ICD-10-CM | POA: Diagnosis not present

## 2020-09-16 MED ORDER — ESCITALOPRAM OXALATE 10 MG PO TABS: 10.0000 mg | ORAL_TABLET | Freq: Every day | ORAL | 2 refills | Status: DC

## 2020-09-16 NOTE — Patient Instructions (Addendum)
I'm sorry you and [name] are struggling so much .  I am adding Lexapro to your wellbutrin  to help manage the anxiety you are having   Please start the Lexapro (escitalopram) at 1/2 tablet daily in the evening for the first few days to avoid nausea.  You can increase to a full tablet after 6 days if you havenot developed side effects of nausea.  If the lexapro interferes with your sleep, take it in the morning instead  Please return in  4 weeks ,  Or e mail me to let me know how it is helping your depression    Return for fasting labs on July 12

## 2020-09-16 NOTE — Progress Notes (Signed)
Subjective:  Patient ID: Theodore [name], male    DOB: 1929-12-29  Age: 87 y.o. MRN: [id number]  CC: The primary encounter diagnosis was Long-term use of high-risk medication. Diagnoses of Low testosterone, Depression with anxiety, Chronic obstructive pulmonary disease, unspecified COPD type ([place]), Hypogonadism male, Weakness of both lower extremities, and Nonallergic vasomotor rhinitis were also pertinent to this visit.  HPI Theodore [name] presents for LOW TESTOSTERONE, DEPRESSION, AND CAD  Depression  screen positive:  STRESSED OUT and depressed by  wife [name]'s persistent severe sciatica x 5 months despite  lumbar decompressive surgery.  Her recovery was complicated by a fall  with fractures and DVT's . Finds himself living through the regrets of his past ; past mistakes, etc  Stil bothered by PND which makes him cough at night .  Nose runs constantly ,  has had multple ENT evaluations in the past , has tried every time of nasal spray,  uses a wedge pill,  but now has an appt with Margaretha Sheffield for a new nonsurgical procedure RhinAer   Bothered by leg weakness.   Loses balance with quick turns. .  Wants to defer PT .  Wants to continue current levels of medications for depression   Trigger finger in both hands  aggravated by painting and playing harmonica.  Saw [facility] and had cortisone injections 2 months ago which helped for a while . Also had PT for shoulder pain aggravated by picking up his wife during her perioperative period  has not tried using voltaren.  Knee is feeling better without surgery despite going up and down stairs and doing 12 squats daily . Taking a joint supplement which may be helping,   Testosterone deficiency:  got behind due to bottle problems.[name]  last dose was Tuesday . Does it every 2 weeks.  Has trouble getting the complete 2nd dose out of each bottle.    All labs due on jun 12 th  Outpatient Medications Prior to Visit  Medication Sig Dispense Refill    Alpha-Lipoic Acid 600 MG CAPS Take by mouth.     ascorbic Acid (VITAMIN C) 500 MG CPCR Take by mouth.     b complex vitamins tablet Take by mouth.     buPROPion (WELLBUTRIN XL) 150 MG 24 hr tablet Take 1 tablet (150 mg total) by mouth daily. 90 tablet 1   Cholecalciferol 125 MCG (5000 UT) TABS Take by mouth.     COD LIVER OIL PO Take by mouth.     Coenzyme Q10 100 MG capsule Take by mouth.     cycloSPORINE (RESTASIS) 0.05 % ophthalmic emulsion Restasis 0.05 % eye drops in a dropperette  INSTILL 1 DROP INTO BOTH EYES TWICE A DAY     donepezil (ARICEPT) 5 MG tablet Take by mouth.     ezetimibe (ZETIA) 10 MG tablet Take 1 tablet (10 mg total) by mouth daily. 90 tablet 3   ipratropium-albuterol (DUONEB) 0.5-2.5 (3) MG/3ML SOLN Take 3 mLs by nebulization every 6 (six) hours as needed. 360 mL 0   levothyroxine (SYNTHROID) 100 MCG tablet TAKE 1 TABLET BY MOUTH EVERY DAY BEFORE BREAKFAST 90 tablet 0   magnesium oxide (MAG-OX) 400 MG tablet Take 400 mg by mouth daily.     OVER THE COUNTER MEDICATION Take 4 tablets by mouth daily. Standard Process- Cardio Plus     OVER THE COUNTER MEDICATION Take 1 tablet by mouth daily. Cardio Platinum - Vitamin K-2 500 mg, Arginine 300 mg, Natokinase 100  mg     OVER THE COUNTER MEDICATION Take by mouth daily. Hemp oil     Probiotic Product (PROBIOTIC + TURMERIC EXTRACT) 400 MG CAPS Take by mouth.     Probiotic Product (PROBIOTIC ACIDOPHILUS) CAPS Take 1 capsule by mouth daily.     testosterone cypionate (DEPOTESTOSTERONE CYPIONATE) 200 MG/ML injection INJECT 0.5 ML (100 MG TOTAL) INTO THE MUSCLE EVERY 14 DAYS. 1 mL 0   Turmeric 400 MG CAPS Take by mouth.     Flaxseed, Linseed, (FLAX SEEDS PO) Take by mouth daily. With omega 3 (Patient not taking: Reported on 09/16/2020)     LUTEIN PO Take by mouth. (Patient not taking: Reported on 09/16/2020)     S-Adenosylmethionine 400 MG TABS Take by mouth. (Patient not taking: Reported on 09/16/2020)     No facility-administered  medications prior to visit.    Review of Systems;  Patient denies headache, fevers, malaise, unintentional weight loss, skin rash, eye pain, sinus congestion and sinus pain, sore throat, dysphagia,  hemoptysis , cough, dyspnea, wheezing, chest pain, palpitations, orthopnea, edema, abdominal pain, nausea, melena, diarrhea, constipation, flank pain, dysuria, hematuria, urinary  Frequency, nocturia, numbness, tingling, seizures,  Focal weakness, Loss of consciousness,  Tremor, insomnia, depression, anxiety, and suicidal ideation.      Objective:  BP 126/66 (BP Location: Left Arm, Patient Position: Sitting, Cuff Size: Normal)   Pulse 66   Temp (!) 95.3 F (35.2 C) (Temporal)   Resp 16   Ht 6\' 3"  (1.905 m)   Wt 179 lb 6.4 oz (81.4 kg)   SpO2 97%   BMI 22.42 kg/m   BP Readings from Last 3 Encounters:  09/16/20 126/66  08/25/20 130/60  08/03/20 128/77    Wt Readings from Last 3 Encounters:  09/16/20 179 lb 6.4 oz (81.4 kg)  08/25/20 178 lb (80.7 kg)  08/03/20 182 lb 3.2 oz (82.6 kg)    General appearance: alert, cooperative and appears stated age Ears: normal TM's and external ear canals both ears Throat: lips, mucosa, and tongue normal; teeth and gums normal Neck: no adenopathy, no carotid bruit, supple, symmetrical, trachea midline and thyroid not enlarged, symmetric, no tenderness/mass/nodules Back: symmetric, no curvature. ROM normal. No CVA tenderness. Lungs: clear to auscultation bilaterally Heart: regular rate and rhythm, S1, S2 normal, no murmur, click, rub or gallop Abdomen: soft, non-tender; bowel sounds normal; no masses,  no organomegaly Pulses: 2+ and symmetric Skin: Skin color, texture, turgor normal. No rashes or lesions Lymph nodes: Cervical, supraclavicular, and axillary nodes normal.  Lab Results  Component Value Date   HGBA1C 4.7 05/28/2018    Lab Results  Component Value Date   CREATININE 1.37 (H) 04/14/2020   CREATININE 1.22 09/23/2019   CREATININE  1.21 08/22/2019    Lab Results  Component Value Date   WBC 7.3 04/14/2020   HGB 15.6 04/14/2020   HCT 44.4 04/14/2020   PLT 297 04/14/2020   GLUCOSE 73 04/14/2020   CHOL 218 (H) 08/22/2019   TRIG 118.0 08/22/2019   HDL 63.30 08/22/2019   LDLCALC 131 (H) 08/22/2019   ALT 18 09/23/2019   AST 22 09/23/2019   NA 137 04/14/2020   K 4.5 04/14/2020   CL 102 04/14/2020   CREATININE 1.37 (H) 04/14/2020   BUN 20 04/14/2020   CO2 27 04/14/2020   TSH 1.39 09/23/2019   PSA 9.77 (H) 10/18/2018   HGBA1C 4.7 05/28/2018    DG Chest 2 View  Result Date: 04/14/2020 CLINICAL DATA:  Dyspnea, cough EXAM:  CHEST - 2 VIEW COMPARISON:  02/12/2019 FINDINGS: The heart size and mediastinal contours are within normal limits. Both lungs are clear. The visualized skeletal structures are unremarkable. IMPRESSION: No active cardiopulmonary disease. Electronically Signed   By: Fidela Salisbury MD   On: 04/14/2020 11:06    Assessment & Plan:   Problem List Items Addressed This Visit       Unprioritized   Chronic obstructive pulmonary disease ([place])    Recommended trial of albuterol MDI prior to exercise/exertion given his increased symptoms       Depression with anxiety    His mood today is quite pessimistic and frustrated, aggravated by his wife [name]'s constant unrelating pain  . I have recommended use of lexapro as a trial , which which he has accepted        Relevant Medications   escitalopram (LEXAPRO) 10 MG tablet   Hypogonadism male    Managed with testosterone replacement.  Repeat level is due next week   Lab Results  Component Value Date   TESTOSTERONE 441.42 10/18/2018          Long-term use of high-risk medication - Primary   Relevant Orders   CBC with Differential/Platelet   Comprehensive metabolic panel   Lipid panel   PSA, Medicare   Nonallergic vasomotor rhinitis    His constant symptoms of  rhinorrhea and PND are negatively impacting his life and he is scheduled to discuss  a  Novel procedure with Dr Kathyrn Sheriff        Weakness of both lower extremities    He has several mild signs and symptoms suggestive of PD but was evaluated by Neurology and PD ruled out        Other Visit Diagnoses     Low testosterone       Relevant Orders   Testosterone Total,Free,Bio, Males-([facility])       I spent 30 minutes dedicated to the care of this patient on the date of this encounter to include pre-visit review of his medical history,  Face-to-face time with the patient , counselling on his mood disorder,  testosterone deficiency , and post visit ordering of testing and therapeutics.   I have discontinued Carloyn Manner A. Godsey's LUTEIN PO, (Flaxseed, Linseed, (FLAX SEEDS PO)), and S-Adenosylmethionine. I am also having him start on escitalopram. Additionally, I am having him maintain his COD LIVER OIL PO, magnesium oxide, OVER THE COUNTER MEDICATION, OVER THE COUNTER MEDICATION, Probiotic Acidophilus BioBeads, OVER THE COUNTER MEDICATION, ascorbic Acid, Alpha-Lipoic Acid, b complex vitamins, Coenzyme Q10, donepezil, Turmeric, Restasis, Probiotic + Turmeric Extract, Cholecalciferol, ipratropium-albuterol, buPROPion, ezetimibe, levothyroxine, and testosterone cypionate.  Meds ordered this encounter  Medications   escitalopram (LEXAPRO) 10 MG tablet    Sig: Take 1 tablet (10 mg total) by mouth daily.    Dispense:  30 tablet    Refill:  2    Medications Discontinued During This Encounter  Medication Reason   LUTEIN PO    Flaxseed, Linseed, (FLAX SEEDS PO) Patient Preference   S-Adenosylmethionine 400 MG TABS     Follow-up: No follow-ups on file.   [name], MD

## 2020-09-19 NOTE — Assessment & Plan Note (Signed)
Managed with testosterone replacement.  Repeat level is due next week   Lab Results  Component Value Date   TESTOSTERONE 441.42 10/18/2018

## 2020-09-19 NOTE — Assessment & Plan Note (Signed)
Recommended trial of albuterol MDI prior to exercise/exertion given his increased symptoms

## 2020-09-19 NOTE — Assessment & Plan Note (Signed)
He has several mild signs and symptoms suggestive of PD but was evaluated by Neurology and PD ruled out

## 2020-09-19 NOTE — Assessment & Plan Note (Signed)
His constant symptoms of  rhinorrhea and PND are negatively impacting his life and he is scheduled to discuss a  Novel procedure with Dr Kathyrn Sheriff

## 2020-09-19 NOTE — Assessment & Plan Note (Signed)
His mood today is quite pessimistic and frustrated, aggravated by his wife [name]'s constant unrelating pain  . I have recommended use of lexapro as a trial , which which he has accepted

## 2020-09-21 ENCOUNTER — Other Ambulatory Visit: Payer: Self-pay

## 2020-09-21 ENCOUNTER — Other Ambulatory Visit (INDEPENDENT_AMBULATORY_CARE_PROVIDER_SITE_OTHER): Payer: PPO

## 2020-09-21 DIAGNOSIS — R7989 Other specified abnormal findings of blood chemistry: Secondary | ICD-10-CM | POA: Diagnosis not present

## 2020-09-21 DIAGNOSIS — Z79899 Other long term (current) drug therapy: Secondary | ICD-10-CM

## 2020-09-21 DIAGNOSIS — R972 Elevated prostate specific antigen [PSA]: Secondary | ICD-10-CM

## 2020-09-21 DIAGNOSIS — R944 Abnormal results of kidney function studies: Secondary | ICD-10-CM

## 2020-09-21 LAB — COMPREHENSIVE METABOLIC PANEL
ALT: 18 U/L (ref 0–53)
AST: 22 U/L (ref 0–37)
Albumin: 4.1 g/dL (ref 3.5–5.2)
Alkaline Phosphatase: 40 U/L (ref 39–117)
BUN: 21 mg/dL (ref 6–23)
CO2: 30 mEq/L (ref 19–32)
Calcium: 9 mg/dL (ref 8.4–10.5)
Chloride: 103 mEq/L (ref 96–112)
Creatinine, Ser: 1.32 mg/dL (ref 0.40–1.50)
GFR: 47.89 mL/min — ABNORMAL LOW (ref >60.00)
Glucose, Bld: 89 mg/dL (ref 70–99)
Potassium: 4.4 mEq/L (ref 3.5–5.1)
Sodium: 140 mEq/L (ref 135–145)
Total Bilirubin: 1.1 mg/dL (ref 0.2–1.2)
Total Protein: 6 g/dL (ref 6.0–8.3)

## 2020-09-21 LAB — CBC WITH DIFFERENTIAL/PLATELET
Basophils Absolute: 0 10*3/uL (ref 0.0–0.1)
Basophils Relative: 0.5 % (ref 0.0–3.0)
Eosinophils Absolute: 0.2 10*3/uL (ref 0.0–0.7)
Eosinophils Relative: 5.1 % — ABNORMAL HIGH (ref 0.0–5.0)
HCT: 44 % (ref 39.0–52.0)
Hemoglobin: 15.1 g/dL (ref 13.0–17.0)
Lymphocytes Relative: 25.1 % (ref 12.0–46.0)
Lymphs Abs: 1.2 10*3/uL (ref 0.7–4.0)
MCHC: 34.3 g/dL (ref 30.0–36.0)
MCV: 94.5 fl (ref 78.0–100.0)
Monocytes Absolute: 0.4 10*3/uL (ref 0.1–1.0)
Monocytes Relative: 9.5 % (ref 3.0–12.0)
Neutro Abs: 2.8 10*3/uL (ref 1.4–7.7)
Neutrophils Relative %: 59.8 % (ref 43.0–77.0)
Platelets: 226 10*3/uL (ref 150.0–400.0)
RBC: 4.66 Mil/uL (ref 4.22–5.81)
RDW: 13.7 % (ref 11.5–15.5)
WBC: 4.6 10*3/uL (ref 4.0–10.5)

## 2020-09-21 LAB — PSA, MEDICARE: PSA: 16.19 ng/ml — ABNORMAL HIGH (ref 0.10–4.00)

## 2020-09-21 LAB — LIPID PANEL
Cholesterol: 197 mg/dL (ref 0–200)
HDL: 62 mg/dL (ref >39.00)
LDL Cholesterol: 121 mg/dL — ABNORMAL HIGH (ref 0–99)
NonHDL: 134.67
Total CHOL/HDL Ratio: 3
Triglycerides: 70 mg/dL (ref 0.0–149.0)
VLDL: 14 mg/dL (ref 0.0–40.0)

## 2020-09-22 DIAGNOSIS — H04123 Dry eye syndrome of bilateral lacrimal glands: Secondary | ICD-10-CM | POA: Diagnosis not present

## 2020-09-22 LAB — TESTOSTERONE TOTAL,FREE,BIO, MALES
Albumin: 3.9 g/dL (ref 3.6–5.1)
Sex Hormone Binding: 55 nmol/L (ref 22–77)
Testosterone, Bioavailable: 86.2 ng/dL (ref 15.0–150.0)
Testosterone, Free: 48 pg/mL (ref 6.0–73.0)
Testosterone: 539 ng/dL (ref 250–827)

## 2020-09-23 ENCOUNTER — Telehealth: Payer: Self-pay

## 2020-09-23 NOTE — Telephone Encounter (Signed)
LMTCB in regards to lab results.  

## 2020-09-23 NOTE — Addendum Note (Signed)
Addended by: [name] on: 09/23/2020 10:06 AM   Modules accepted: Orders

## 2020-09-30 DIAGNOSIS — J300 Vasomotor rhinitis: Secondary | ICD-10-CM | POA: Diagnosis not present

## 2020-09-30 DIAGNOSIS — R490 Dysphonia: Secondary | ICD-10-CM | POA: Diagnosis not present

## 2020-09-30 DIAGNOSIS — K219 Gastro-esophageal reflux disease without esophagitis: Secondary | ICD-10-CM | POA: Diagnosis not present

## 2020-10-02 ENCOUNTER — Other Ambulatory Visit: Payer: Self-pay | Admitting: Internal Medicine

## 2020-10-04 ENCOUNTER — Ambulatory Visit: Payer: Self-pay | Admitting: Urology

## 2020-10-06 ENCOUNTER — Ambulatory Visit: Payer: PPO | Admitting: Urology

## 2020-10-06 ENCOUNTER — Other Ambulatory Visit: Payer: Self-pay

## 2020-10-06 VITALS — BP 127/73 | HR 60 | Ht 75.0 in | Wt 172.0 lb

## 2020-10-06 DIAGNOSIS — R7989 Other specified abnormal findings of blood chemistry: Secondary | ICD-10-CM

## 2020-10-06 DIAGNOSIS — R972 Elevated prostate specific antigen [PSA]: Secondary | ICD-10-CM

## 2020-10-06 NOTE — Progress Notes (Signed)
   10/06/2020 10:05 AM   Theodore [name] 1929/08/03 [id number]  Reason for visit: Follow up elevated PSA, low testosterone  HPI: I saw Theodore [name] back in urology clinic for elevated PSA.  I saw him last in September 2020 for elevated PSA and recommended 40-month follow-up with repeat PSA, but he did not follow-up.  Briefly, 87 year old healthy male who has been on testosterone replacement through PCP for over 20+ years for fatigue and decreased energy, and is found to have a rising PSA.  When I saw him in 2020 PSA was 9.77 which was elevated from prior value in 2016 of 3.65.  PSA was repeated again recently by PCP and had increased again to 16.2.  DRE today 50 g, smooth, no nodules or masses.  We discussed the relationship between testosterone and PSA at length, as well as the controversy surrounding prostate cancer and PSA screening.  We discussed possible etiologies of his significantly elevated PSA including BPH or malignancy.  We discussed options including prostate biopsy, prostate MRI, or holding testosterone for 4 to 6 weeks and repeating the PSA.  Risks and benefits of biopsy discussed at length, and he would like to avoid this if at all possible.  Hold testosterone, repeat PSA in 4 weeks and follow-up with results   Theodore Co, MD  [facility] [address] [place], [place] [address] [phone]

## 2020-10-06 NOTE — Patient Instructions (Signed)
Prostate Cancer Screening  Prostate cancer screening is a test that is done to check for the presence of prostate cancer in men. The prostate gland is a walnut-sized gland that is located below the bladder and in front of the rectum in males. The function of the prostate is to add fluid to semen during ejaculation. Prostate cancer isthe second most common type of cancer in men. Who should have prostate cancer screening?  Screening recommendations vary based on age and other risk factors. Screening is recommended if: You are older than age 31. If you are age 15-69, talk with your health care provider about your need for screening and how often screening should be done. Because most prostate cancers are slow growing and will not cause death, screening is generally reserved in this age group for men who have a 10-15-year life expectancy. You are younger than age 59, and you have these risk factors: Being a black male or a male of African descent. Having a father, brother, or uncle who has been diagnosed with prostate cancer. The risk is higher if your family member's cancer occurred at an early age. Screening is not recommended if: You are younger than age 29. You are between the ages of 68 and 27 and you have no risk factors. You are 18 years of age or older. At this age, the risks that screening can cause are greater than the benefits that it may provide. If you are at high risk for prostate cancer, your health care provider may recommend that you have screenings more often or that you start screening at ayounger age. How is screening for prostate cancer done? The recommended prostate cancer screening test is a blood test called the prostate-specific antigen (PSA) test. PSA is a protein that is made in the prostate. As you age, your prostate naturally produces more PSA. Abnormally high PSA levels may be caused by: Prostate cancer. An enlarged prostate that is not caused by cancer (benign prostatic  hyperplasia, BPH). This condition is very common in older men. A prostate gland infection (prostatitis). Depending on the PSA results, you may need more tests, such as: A physical exam to check the size of your prostate gland. Blood and imaging tests. A procedure to remove tissue samples from your prostate gland for testing (biopsy). What are the benefits of prostate cancer screening? Screening can help to identify cancer at an early stage, before symptoms start and when the cancer can be treated more easily. There is a small chance that screening may lower your risk of dying from prostate cancer. The chance is small because prostate cancer is a slow-growing cancer, and most men with prostate cancer die from a different cause. What are the risks of prostate cancer screening? The main risk of prostate cancer screening is diagnosing and treating prostate cancer that would never have caused any symptoms or problems. This is called overdiagnosisand overtreatment. PSA screening cannot tell you if your PSA is high due to cancer or a different cause. A prostate biopsy is the only procedure to diagnose prostate cancer. Even the results of a biopsy may not tell you if your cancer needs to be treated. Slow-growing prostate cancer may not need any treatment other than monitoring, so diagnosing and treating it may causeunnecessary stress or other side effects. A prostate biopsy may also cause: Infection or fever. A false negative. This is a result that shows that you do not have prostate cancer when you actually do have prostate cancer. Questions to  ask your health care provider When should I start prostate cancer screening? What is my risk for prostate cancer? How often do I need screening? What type of screening tests do I need? How do I get my test results? What do my results mean? Do I need treatment? Where to find more information The American Cancer Society: www.cancer.org American Urological  Association: www.auanet.org Contact a health care provider if: You have difficulty urinating. You have pain when you urinate or ejaculate. You have blood in your urine or semen. You have pain in your back or in the area of your prostate. Summary Prostate cancer is a common type of cancer in men. The prostate gland is located below the bladder and in front of the rectum. This gland adds fluid to semen during ejaculation. Prostate cancer screening may identify cancer at an early stage, when the cancer can be treated more easily. The prostate-specific antigen (PSA) test is the recommended screening test for prostate cancer. Discuss the risks and benefits of prostate cancer screening with your health care provider. If you are age 18 or older, the risks that screening can cause are greater than the benefits that it may provide. This information is not intended to replace advice given to you by your health care provider. Make sure you discuss any questions you have with your healthcare provider. Document Revised: 02/26/2020 Document Reviewed: 10/10/2018 Elsevier Patient Education  Theodore [name].

## 2020-10-09 ENCOUNTER — Other Ambulatory Visit: Payer: Self-pay | Admitting: Internal Medicine

## 2020-10-12 ENCOUNTER — Telehealth: Payer: Self-pay | Admitting: Internal Medicine

## 2020-10-12 NOTE — Telephone Encounter (Signed)
Patient is returning your call.  

## 2020-10-12 NOTE — Telephone Encounter (Signed)
Pt stated that he has stopped taking the escitalopram (LEXAPRO) 10 MG tablet it is causing more anxiety, depression and the shakes  Pt would like a call back

## 2020-10-12 NOTE — Telephone Encounter (Signed)
Left message for patient to return call back.  

## 2020-10-13 NOTE — Telephone Encounter (Signed)
Left message for patient to return call back.  

## 2020-10-13 NOTE — Telephone Encounter (Signed)
Patient stated he was taking lexapro two weeks total. Once patient started the full dosage after the week of taking half a tablet he started having increased anxiety, depression, and hand shaking very bad. Patient last dosage was yesterday morning.

## 2020-10-13 NOTE — Telephone Encounter (Signed)
Patient is ok with that and has been notified.

## 2020-10-15 ENCOUNTER — Other Ambulatory Visit: Payer: PPO

## 2020-10-15 ENCOUNTER — Other Ambulatory Visit: Payer: Self-pay

## 2020-10-15 DIAGNOSIS — R972 Elevated prostate specific antigen [PSA]: Secondary | ICD-10-CM

## 2020-10-16 LAB — PSA: Prostate Specific Ag, Serum: 14.8 ng/mL — ABNORMAL HIGH (ref 0.0–4.0)

## 2020-10-25 ENCOUNTER — Other Ambulatory Visit: Payer: Self-pay

## 2020-10-25 ENCOUNTER — Other Ambulatory Visit: Payer: PPO

## 2020-10-25 ENCOUNTER — Other Ambulatory Visit (INDEPENDENT_AMBULATORY_CARE_PROVIDER_SITE_OTHER): Payer: PPO

## 2020-10-25 DIAGNOSIS — N1831 Chronic kidney disease, stage 3a: Secondary | ICD-10-CM

## 2020-10-25 DIAGNOSIS — R944 Abnormal results of kidney function studies: Secondary | ICD-10-CM

## 2020-10-25 LAB — RENAL FUNCTION PANEL
Albumin: 4.2 g/dL (ref 3.5–5.2)
BUN: 27 mg/dL — ABNORMAL HIGH (ref 6–23)
CO2: 30 mEq/L (ref 19–32)
Calcium: 9.2 mg/dL (ref 8.4–10.5)
Chloride: 103 mEq/L (ref 96–112)
Creatinine, Ser: 1.46 mg/dL (ref 0.40–1.50)
GFR: 42.41 mL/min — ABNORMAL LOW (ref >60.00)
Glucose, Bld: 95 mg/dL (ref 70–99)
Phosphorus: 3.4 mg/dL (ref 2.3–4.6)
Potassium: 4.4 mEq/L (ref 3.5–5.1)
Sodium: 139 mEq/L (ref 135–145)

## 2020-10-26 NOTE — Addendum Note (Signed)
Addended by: [name] on: 10/26/2020 09:51 AM   Modules accepted: Orders

## 2020-10-27 ENCOUNTER — Ambulatory Visit: Payer: PPO | Admitting: Internal Medicine

## 2020-11-02 DIAGNOSIS — R490 Dysphonia: Secondary | ICD-10-CM | POA: Diagnosis not present

## 2020-11-02 DIAGNOSIS — J300 Vasomotor rhinitis: Secondary | ICD-10-CM | POA: Diagnosis not present

## 2020-11-02 DIAGNOSIS — K219 Gastro-esophageal reflux disease without esophagitis: Secondary | ICD-10-CM | POA: Diagnosis not present

## 2020-11-05 ENCOUNTER — Other Ambulatory Visit: Payer: Self-pay

## 2020-11-10 ENCOUNTER — Other Ambulatory Visit: Payer: Self-pay | Admitting: Nephrology

## 2020-11-10 ENCOUNTER — Ambulatory Visit: Payer: Self-pay | Admitting: Urology

## 2020-11-10 ENCOUNTER — Other Ambulatory Visit (HOSPITAL_COMMUNITY): Payer: Self-pay | Admitting: Nephrology

## 2020-11-10 DIAGNOSIS — N184 Chronic kidney disease, stage 4 (severe): Secondary | ICD-10-CM

## 2020-11-10 DIAGNOSIS — N1832 Chronic kidney disease, stage 3b: Secondary | ICD-10-CM | POA: Insufficient documentation

## 2020-11-10 DIAGNOSIS — R319 Hematuria, unspecified: Secondary | ICD-10-CM | POA: Diagnosis not present

## 2020-11-10 DIAGNOSIS — I129 Hypertensive chronic kidney disease with stage 1 through stage 4 chronic kidney disease, or unspecified chronic kidney disease: Secondary | ICD-10-CM | POA: Insufficient documentation

## 2020-11-10 DIAGNOSIS — R8290 Unspecified abnormal findings in urine: Secondary | ICD-10-CM

## 2020-11-18 ENCOUNTER — Other Ambulatory Visit: Payer: Self-pay | Admitting: Internal Medicine

## 2020-11-18 DIAGNOSIS — J343 Hypertrophy of nasal turbinates: Secondary | ICD-10-CM | POA: Diagnosis not present

## 2020-11-18 DIAGNOSIS — J3489 Other specified disorders of nose and nasal sinuses: Secondary | ICD-10-CM | POA: Diagnosis not present

## 2020-11-18 DIAGNOSIS — J300 Vasomotor rhinitis: Secondary | ICD-10-CM | POA: Diagnosis not present

## 2020-11-22 ENCOUNTER — Other Ambulatory Visit: Payer: Self-pay

## 2020-11-22 ENCOUNTER — Other Ambulatory Visit: Payer: PPO

## 2020-11-22 DIAGNOSIS — R972 Elevated prostate specific antigen [PSA]: Secondary | ICD-10-CM

## 2020-11-23 LAB — PSA: Prostate Specific Ag, Serum: 14.5 ng/mL — ABNORMAL HIGH (ref 0.0–4.0)

## 2020-11-24 ENCOUNTER — Ambulatory Visit: Payer: PPO | Admitting: Urology

## 2020-11-24 ENCOUNTER — Other Ambulatory Visit: Payer: Self-pay

## 2020-11-24 ENCOUNTER — Encounter: Payer: Self-pay | Admitting: Urology

## 2020-11-24 VITALS — BP 130/79 | HR 60 | Ht 75.0 in | Wt 165.0 lb

## 2020-11-24 DIAGNOSIS — R972 Elevated prostate specific antigen [PSA]: Secondary | ICD-10-CM

## 2020-11-24 NOTE — Patient Instructions (Signed)

## 2020-11-24 NOTE — Progress Notes (Signed)
   11/24/2020 11:51 AM   Theodore [name] 1929/08/22 [id number]  Reason for visit: Follow up elevated PSA, low testosterone  HPI: 87 year old male who I originally saw in September 2020 for an elevated PSA of 9.77 which had increased from 3.65 in 2016 and 2.1 in 2008.  I recommended follow-up with repeat PSA in 2 to 3 months, but he did not follow-up.  He has been on testosterone long-term over 10 years via PCP.  His PCP repeated PSA which had jumped up to 16.2 in July 2022.  PSA was repeated in August and remained elevated at 14.8.  At that point we stopped his testosterone for 1 month and plan to repeat the PSA.  That PSA on 11/22/2020 remains elevated at 14.5.  DRE at our last visit was benign.  He also has CKD with recent creatinine of 1.46, and is scheduled for renal ultrasound later this week through nephrology.  I reviewed those outside notes.  We had a very long conversation today about possible etiologies of and options moving forward for his significantly elevated PSA.  We discussed possible etiologies including inflammation, BPH, or prostate cancer.  Unfortunately he is not a candidate for prostate MRI with his artificial knee, and he is very averse to prostate biopsy with his age.  We discussed that even if we found prostate cancer on a biopsy, watchful waiting may be a very reasonable option with his age and comorbidities.  We discussed an additional alternative would be to repeat the PSA in 6 months, and reconsider the above options and biopsy if it continues to rise significantly.  I think he has a very good understanding of options, and it is a reasonable decision with his age and comorbidities to repeat PSA in 6 months.  We discussed the possibility of developing metastatic prostate cancer and symptoms with missing of window to cure by deferring biopsy.  I think it is reasonable for him to resume the testosterone since he had no significant decrease in the PSA off this medication, and he  does feel it improves his quality of life.  Okay to resume testosterone RTC repeat PSA in 6 months Return precautions discussed extensively including gross hematuria or new urinary symptoms   Billey Co, MD  [facility] [address] [place], [place] [address] [phone]

## 2020-11-25 ENCOUNTER — Ambulatory Visit: Payer: PPO | Admitting: Urology

## 2020-11-26 ENCOUNTER — Ambulatory Visit: Payer: PPO

## 2020-11-26 ENCOUNTER — Other Ambulatory Visit: Payer: Self-pay

## 2020-11-26 ENCOUNTER — Ambulatory Visit
Admission: RE | Admit: 2020-11-26 | Discharge: 2020-11-26 | Disposition: A | Discharge status: Home / Self Care | Payer: PPO | Source: Ambulatory Visit | Attending: Nephrology | Admitting: Nephrology

## 2020-11-26 DIAGNOSIS — N184 Chronic kidney disease, stage 4 (severe): Secondary | ICD-10-CM | POA: Insufficient documentation

## 2020-11-26 DIAGNOSIS — N1832 Chronic kidney disease, stage 3b: Secondary | ICD-10-CM

## 2020-11-26 DIAGNOSIS — R319 Hematuria, unspecified: Secondary | ICD-10-CM | POA: Insufficient documentation

## 2020-11-26 DIAGNOSIS — R8290 Unspecified abnormal findings in urine: Secondary | ICD-10-CM

## 2020-11-26 DIAGNOSIS — I129 Hypertensive chronic kidney disease with stage 1 through stage 4 chronic kidney disease, or unspecified chronic kidney disease: Secondary | ICD-10-CM

## 2020-11-26 DIAGNOSIS — N189 Chronic kidney disease, unspecified: Secondary | ICD-10-CM | POA: Diagnosis not present

## 2020-11-26 DIAGNOSIS — H903 Sensorineural hearing loss, bilateral: Secondary | ICD-10-CM | POA: Diagnosis not present

## 2020-11-29 ENCOUNTER — Ambulatory Visit (INDEPENDENT_AMBULATORY_CARE_PROVIDER_SITE_OTHER): Payer: PPO | Admitting: Internal Medicine

## 2020-11-29 ENCOUNTER — Other Ambulatory Visit: Payer: Self-pay

## 2020-11-29 ENCOUNTER — Encounter: Payer: Self-pay | Admitting: Internal Medicine

## 2020-11-29 VITALS — BP 128/64 | HR 68 | Temp 95.5°F | Ht 75.0 in | Wt 172.2 lb

## 2020-11-29 DIAGNOSIS — Z79899 Other long term (current) drug therapy: Secondary | ICD-10-CM | POA: Diagnosis not present

## 2020-11-29 DIAGNOSIS — E785 Hyperlipidemia, unspecified: Secondary | ICD-10-CM

## 2020-11-29 DIAGNOSIS — E034 Atrophy of thyroid (acquired): Secondary | ICD-10-CM

## 2020-11-29 DIAGNOSIS — F418 Other specified anxiety disorders: Secondary | ICD-10-CM

## 2020-11-29 DIAGNOSIS — R7989 Other specified abnormal findings of blood chemistry: Secondary | ICD-10-CM | POA: Diagnosis not present

## 2020-11-29 DIAGNOSIS — G250 Essential tremor: Secondary | ICD-10-CM

## 2020-11-29 DIAGNOSIS — R972 Elevated prostate specific antigen [PSA]: Secondary | ICD-10-CM

## 2020-11-29 DIAGNOSIS — G3184 Mild cognitive impairment, so stated: Secondary | ICD-10-CM

## 2020-11-29 DIAGNOSIS — J300 Vasomotor rhinitis: Secondary | ICD-10-CM | POA: Diagnosis not present

## 2020-11-29 DIAGNOSIS — N1831 Chronic kidney disease, stage 3a: Secondary | ICD-10-CM

## 2020-11-29 NOTE — Patient Instructions (Addendum)
I recommend that you resume the testosterone and have your level checked in about 7 weeks   You need about 60 grams of protein  daily to support muscles   Consider adding a protein drink once daily (they have 25  to 30 g protein and lots of calcium as well)  Take a break from your brother's  letters

## 2020-11-29 NOTE — Progress Notes (Addendum)
Subjective:  Patient ID: Theodore [name], male    DOB: 1930-02-03  Age: 87 y.o. MRN: [id number]  CC: The primary encounter diagnosis was Low serum testosterone level. Diagnoses of Long-term use of high-risk medication, Depression with anxiety, Nonallergic vasomotor rhinitis, Hypothyroidism due to acquired atrophy of thyroid, Hyperlipidemia LDL goal <100, Essential tremor, Mild cognitive impairment with memory loss, Elevated PSA, between 10 and less than 20 ng/ml, and Stage 3a chronic kidney disease ([place]) were also pertinent to this visit.  HPI Theodore [name] presents for follow up on depression and other issues.  Chief Complaint  Patient presents with   Follow-up    This visit occurred during the SARS-CoV-2 public health emergency.  Safety protocols were in place, including screening questions prior to the visit, additional usage of staff PPE, and extensive cleaning of exam room while observing appropriate contact time as indicated for disinfecting solutions.   1) Elevated PSA:  Seeing urology , surveillance chosen d over biopsy  due to age.   PSA is slightly lower on repeat.  Testosterone supplementation was suspended but he was told he could resume it. snce the PSA did not change significantly.   Since suspending it he has noted decreased QOL due to low energy level.  He wants to resume it at previous dose.   2) CKD:  has seen nephrology for CKD.  GFR improving to 47 ml with increased intake of water. Avoiding NSAIDs.   3) recent 4  day episode of chest tightness and dyspnea,  profound leg weakness. Could not speak in a complete sentence. Did not have chest pain, cough, orthopnea, fluid retention or calf pain so he did not seek immediate medical attention. Symptoms resolved ,  completely . Last COVID booster was in the [facility]   4) Weight loss of  15 lbs , unintentional. Denies early satiety.   Wife [name] has been suffering from debilitating pain  for several months, which has made him very  worried.    Diet reviewed.    5) Self perceived cognitive changes. Can no longer perform simple math computations without calculator,  trouble remembering names.  Has not seen neurologist Dr. Manuella Ghazi in about a year, but feels that his MCI has progressed.  His deficits make him frustrated and irritable.  6) Depression:  feelings of low self esteem aggravated by memorializing his brother who was killed in WW2 and whose many letters  he has been reading lately in their original forms. He is tearful today when discussing how inferior  he feels compared to the heroicism of  his brother  . Stopped the lexapro bc it aggravated his tremor.  Has been tolerating wellbutrin for years  7) Dysphagia: had a choking incident last evening.  Was talking while eating. Reviewed prior swallow study.   8) Vasomotor rhinitis;  had a new procedure fby Dr Kathyrn Sheriff that not only failed to relieve his rhinitis but resulted in a major nosebleed that occurred as he was driving home from the procedure   Outpatient Medications Prior to Visit  Medication Sig Dispense Refill   Alpha-Lipoic Acid 600 MG CAPS Take by mouth.     ascorbic Acid (VITAMIN C) 500 MG CPCR Take by mouth.     b complex vitamins tablet Take by mouth.     buPROPion (WELLBUTRIN XL) 150 MG 24 hr tablet TAKE 1 TABLET BY MOUTH EVERY DAY 90 tablet 1   Cholecalciferol 125 MCG (5000 UT) TABS Take by mouth.  COD LIVER OIL PO Take by mouth.     Coenzyme Q10 100 MG capsule Take by mouth.     cycloSPORINE (RESTASIS) 0.05 % ophthalmic emulsion Restasis 0.05 % eye drops in a dropperette  INSTILL 1 DROP INTO BOTH EYES TWICE A DAY     donepezil (ARICEPT) 5 MG tablet Take by mouth.     ezetimibe (ZETIA) 10 MG tablet Take 1 tablet (10 mg total) by mouth daily. 90 tablet 3   ipratropium-albuterol (DUONEB) 0.5-2.5 (3) MG/3ML SOLN Take 3 mLs by nebulization every 6 (six) hours as needed. 360 mL 0   levothyroxine (SYNTHROID) 100 MCG tablet TAKE 1 TABLET BY MOUTH EVERY DAY  BEFORE BREAKFAST 90 tablet 0   magnesium oxide (MAG-OX) 400 MG tablet Take 400 mg by mouth daily.     OVER THE COUNTER MEDICATION Take 4 tablets by mouth daily. Standard Process- Cardio Plus     OVER THE COUNTER MEDICATION Take 1 tablet by mouth daily. Cardio Platinum - Vitamin K-2 500 mg, Arginine 300 mg, Natokinase 100 mg     OVER THE COUNTER MEDICATION Take by mouth daily. Hemp oil     Probiotic Product (PROBIOTIC + TURMERIC EXTRACT) 400 MG CAPS Take by mouth.     Probiotic Product (PROBIOTIC ACIDOPHILUS) CAPS Take 1 capsule by mouth daily.     Turmeric 400 MG CAPS Take by mouth.     escitalopram (LEXAPRO) 10 MG tablet TAKE 1 TABLET BY MOUTH EVERY DAY 90 tablet 1   testosterone cypionate (DEPOTESTOSTERONE CYPIONATE) 200 MG/ML injection INJECT 0.5 ML (100 MG TOTAL) INTO THE MUSCLE EVERY 14 DAYS. (Patient not taking: Reported on 11/29/2020) 1 mL 5   No facility-administered medications prior to visit.    Review of Systems;  Patient denies headache, fevers, malaise, unintentional weight loss, skin rash, eye pain, sinus congestion and sinus pain, sore throat, dysphagia,  hemoptysis , cough, dyspnea, wheezing, chest pain, palpitations, orthopnea, edema, abdominal pain, nausea, melena, diarrhea, constipation, flank pain, dysuria, hematuria, urinary  Frequency, nocturia, numbness, tingling, seizures,  Focal weakness, Loss of consciousness,  Tremor, insomnia, depression, anxiety, and suicidal ideation.      Objective:  BP 128/64 (BP Location: Left Arm, Patient Position: Sitting, Cuff Size: Normal)   Pulse 68   Temp (!) 95.5 F (35.3 C) (Temporal)   Ht 6\' 3"  (1.905 m)   Wt 172 lb 3.2 oz (78.1 kg)   SpO2 99%   BMI 21.52 kg/m   BP Readings from Last 3 Encounters:  11/29/20 128/64  11/24/20 130/79  10/06/20 127/73    Wt Readings from Last 3 Encounters:  11/29/20 172 lb 3.2 oz (78.1 kg)  11/24/20 165 lb (74.8 kg)  10/06/20 172 lb (78 kg)    General appearance: alert, cooperative and  appears stated age Ears: normal TM's and external ear canals both ears Throat: lips, mucosa, and tongue normal; teeth and gums normal Neck: no adenopathy, no carotid bruit, supple, symmetrical, trachea midline and thyroid not enlarged, symmetric, no tenderness/mass/nodules Back: symmetric, no curvature. ROM normal. No CVA tenderness. Lungs: clear to auscultation bilaterally Heart: regular rate and rhythm, S1, S2 normal, no murmur, click, rub or gallop Abdomen: soft, non-tender; bowel sounds normal; no masses,  no organomegaly Pulses: 2+ and symmetric Skin: Skin color, texture, turgor normal. No rashes or lesions Lymph nodes: Cervical, supraclavicular, and axillary nodes normal.  Lab Results  Component Value Date   HGBA1C 4.7 05/28/2018    Lab Results  Component Value Date   CREATININE 1.46  10/25/2020   CREATININE 1.32 09/21/2020   CREATININE 1.37 (H) 04/14/2020    Lab Results  Component Value Date   WBC 4.6 09/21/2020   HGB 15.1 09/21/2020   HCT 44.0 09/21/2020   PLT 226.0 09/21/2020   GLUCOSE 95 10/25/2020   CHOL 197 09/21/2020   TRIG 70.0 09/21/2020   HDL 62.00 09/21/2020   LDLCALC 121 (H) 09/21/2020   ALT 18 09/21/2020   AST 22 09/21/2020   NA 139 10/25/2020   K 4.4 10/25/2020   CL 103 10/25/2020   CREATININE 1.46 10/25/2020   BUN 27 (H) 10/25/2020   CO2 30 10/25/2020   TSH 1.39 09/23/2019   PSA 16.19 (H) 09/21/2020   HGBA1C 4.7 05/28/2018    US RENAL  Result Date: 11/27/2020 CLINICAL DATA:  Hematuria.  Chronic renal disease. EXAM: RENAL / URINARY TRACT ULTRASOUND COMPLETE COMPARISON:  None. FINDINGS: Right Kidney: Renal measurements: 10 x 4 x 4.2 cm = volume: 88 mL. Echogenicity within normal limits. No mass or hydronephrosis visualized. Left Kidney: Renal measurements: 9.3 x 4.6 x 4.7 cm = volume: 107 mL. Echogenicity within normal limits. No mass or hydronephrosis visualized. Bladder: Appears normal for degree of bladder distention. Other: None. IMPRESSION: No  cause for the patient's symptoms identified. Electronically Signed   By: Dorise Bullion III M.D.   On: 11/27/2020 16:23    Assessment & Plan:   Problem List Items Addressed This Visit       Unprioritized   Depression with anxiety    Did not tolerate lexapro due to aggravation of hand tremor.  Continue wellbutrin,  Resume testosterone,  Advised to avoid reading his brother's letters since this has aggravated his symptoms       Low serum testosterone level - Primary    He will resume testosterone supplementation since Urology did not note a significant change in PSA after suspending it      Relevant Orders   Testosterone Total,Free,Bio, Males-([facility])   Long-term use of high-risk medication   Relevant Orders   Comprehensive metabolic panel   CBC with Differential/Platelet   Nonallergic vasomotor rhinitis    There has been no improvement with the recent procedure, and he developed epistaxis post procedure.       Hypothyroidism    He is overdue for TSH .  Ordered.       Relevant Orders   TSH   Hyperlipidemia LDL goal <100    Managed with Zetia due to patient preference to avoid statins.   Lab Results  Component Value Date   CHOL 197 09/21/2020   HDL 62.00 09/21/2020   LDLCALC 121 (H) 09/21/2020   TRIG 70.0 09/21/2020   CHOLHDL 3 09/21/2020   Lab Results  Component Value Date   ALT 18 09/21/2020   AST 22 09/21/2020   ALKPHOS 40 09/21/2020   BILITOT 1.1 09/21/2020         Essential tremor    Tremor has improved status post MRI Guided Focused Ultrasound Thalamotomy (Left Sided - 07/21/2019) .  Symptoms were aggravated by trial of lexapro        Mild cognitive impairment with memory loss    With increased difficulty managing simple calculations and remembering names /Follow up with Neurology planned.  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  No change with  testosterone suspension; QOL has suffered without it so he has requested to resume it.       CKD (chronic kidney disease) stage 3, GFR 30-59 ml/min ([facility])    Managed with avoidance of NSAIDs.  Seeing nephrology.  GFR has improved to 47 ml/min per last check .    Lab Results  Component Value Date   CREATININE 1.46 10/25/2020          I provided  40 minutes of  face-to-face time during this encounter reviewing patient's current problems and past surgeries, labs and imaging studies, providing counseling on the above mentioned problems , and coordination  of care .   Medications Discontinued During This Encounter  Medication Reason   escitalopram (LEXAPRO) 10 MG tablet     Follow-up: Return in about 7 weeks (around 01/17/2021).   [name], MD

## 2020-11-30 DIAGNOSIS — R972 Elevated prostate specific antigen [PSA]: Secondary | ICD-10-CM | POA: Insufficient documentation

## 2020-11-30 DIAGNOSIS — G3184 Mild cognitive impairment, so stated: Secondary | ICD-10-CM | POA: Insufficient documentation

## 2020-11-30 NOTE — Assessment & Plan Note (Signed)
He is overdue for TSH .  Ordered.

## 2020-11-30 NOTE — Assessment & Plan Note (Addendum)
With increased difficulty managing simple calculations and remembering names /Follow up with Neurology planned.  Continue Aricept 5 mg daily

## 2020-11-30 NOTE — Assessment & Plan Note (Signed)
He will resume testosterone supplementation since Urology did not note a significant change in PSA after suspending it

## 2020-11-30 NOTE — Assessment & Plan Note (Signed)
Tremor has improved status post MRI Guided Focused Ultrasound Thalamotomy (LeftSided - 07/21/2019) .  Symptoms were aggravated by trial of lexapro

## 2020-11-30 NOTE — Assessment & Plan Note (Addendum)
Managed with Zetia due to patient preference to avoid statins.   Lab Results  Component Value Date   CHOL 197 09/21/2020   HDL 62.00 09/21/2020   LDLCALC 121 (H) 09/21/2020   TRIG 70.0 09/21/2020   CHOLHDL 3 09/21/2020   Lab Results  Component Value Date   ALT 18 09/21/2020   AST 22 09/21/2020   ALKPHOS 40 09/21/2020   BILITOT 1.1 09/21/2020

## 2020-11-30 NOTE — Assessment & Plan Note (Signed)
Did not tolerate lexapro due to aggravation of hand tremor.  Continue wellbutrin,  Resume testosterone,  Advised to avoid reading his brother's letters since this has aggravated his symptoms

## 2020-11-30 NOTE — Assessment & Plan Note (Signed)
There has been no improvement with the recent procedure, and he developed epistaxis post procedure.

## 2020-11-30 NOTE — Assessment & Plan Note (Signed)
S/p Urology evaluation,  Presumed indolent prostate CA but biopsy deferred due to age .  No change with testosterone suspension; QOL has suffered without it so he has requested to resume it.

## 2020-12-01 DIAGNOSIS — N183 Chronic kidney disease, stage 3 unspecified: Secondary | ICD-10-CM | POA: Insufficient documentation

## 2020-12-01 NOTE — Assessment & Plan Note (Signed)
Managed with avoidance of NSAIDs.  Seeing nephrology.  GFR has improved to 47 ml/min per last check .    Lab Results  Component Value Date   CREATININE 1.46 10/25/2020

## 2020-12-02 ENCOUNTER — Telehealth: Payer: Self-pay | Admitting: Internal Medicine

## 2020-12-02 NOTE — Telephone Encounter (Signed)
Number busy

## 2020-12-02 NOTE — Telephone Encounter (Signed)
Patient is calling in to see if Dr.Tullo can call him in a prescription for vallium or another medicine to calm him.Please advise.

## 2020-12-03 ENCOUNTER — Telehealth: Payer: Self-pay | Admitting: Internal Medicine

## 2020-12-03 NOTE — Telephone Encounter (Addendum)
Patient last seen 02/15/2019. Pending OV 12/16/2020.  Patient stated every afternoon around 3 he develops increased sob and leg weakness. Sx have been present for 2 weeks. sx of covid 03/2020. Recently seen PCP and was advised to see Dr. Mortimer Fries.  Denied f/c/s, chest pain, cough or additional sx.  Appt scheduled for 12/08/2020 at 10:00. Patient is agreeable with driving to . Not currently using inhaler. Recommended that he go to UC or ED if sx worsen before appt. Patient would like recommendations.  Dr. Patsey Berthold, please advise. Dr. Mortimer Fries is unavailable.

## 2020-12-03 NOTE — Telephone Encounter (Signed)
Noted.  Patient is aware and voiced his underatanding.

## 2020-12-03 NOTE — Telephone Encounter (Signed)
I agree with the recommendations given.

## 2020-12-07 ENCOUNTER — Ambulatory Visit (INDEPENDENT_AMBULATORY_CARE_PROVIDER_SITE_OTHER): Payer: PPO | Admitting: Internal Medicine

## 2020-12-07 DIAGNOSIS — F430 Acute stress reaction: Secondary | ICD-10-CM | POA: Diagnosis not present

## 2020-12-07 DIAGNOSIS — F411 Generalized anxiety disorder: Secondary | ICD-10-CM | POA: Insufficient documentation

## 2020-12-07 MED ORDER — SERTRALINE HCL 50 MG PO TABS: 50.0000 mg | ORAL_TABLET | Freq: Every day | ORAL | 3 refills | Status: DC

## 2020-12-07 MED ORDER — HYDROXYZINE PAMOATE 25 MG PO CAPS: 25.0000 mg | ORAL_CAPSULE | Freq: Three times a day (TID) | ORAL | 0 refills | Status: DC | PRN

## 2020-12-07 NOTE — Telephone Encounter (Signed)
Pt scheduled for a telephone visit. Unable to do video.

## 2020-12-07 NOTE — Telephone Encounter (Signed)
Patient returning your call.   Patient states this is urgent. Patient sounded winded and quick/ short breathes on the phone. States he is stressed because of a battle with the [employer]. Patient has history of anxiety but states this is worse than before.

## 2020-12-07 NOTE — Telephone Encounter (Signed)
LMTCB

## 2020-12-07 NOTE — Progress Notes (Signed)
Telephone Note  This visit type was conducted due to national recommendations for restrictions regarding the COVID-19 pandemic (e.g. social distancing).  This format is felt to be most appropriate for this patient at this time.  All issues noted in this document were discussed and addressed.  No physical exam was performed (except for noted visual exam findings with Video Visits).   I connected withNAME@ on 12/07/20 at 12:30 PM EDT by  telephone and verified that I am speaking with the correct person using two identifiers. Location patient: home Location provider: work or home office Persons participating in the virtual visit: patient, provider  I discussed the limitations, risks, security and privacy concerns of performing an evaluation and management service by telephone and the availability of in person appointments. I also discussed with the patient that there may be a patient responsible charge related to this service. The patient expressed understanding and agreed to proceed.   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  He is having trouble sleeping and waking up very anxious  denies panic attacks,  alcohol abuse, loss of appetite.  Exercising daily which helps.  Having trouble compartmentalizing his thoughts , finds himself worrying constantly about his situation .  Feels more tremulous than usual.  Denies suicidality. . He has an Forensic psychologist and and Optometrist and is meeting with them regularly for legal advice.   ROS: See pertinent positives and negatives per HPI.  Past Medical History:  Diagnosis Date   Arthritis    knees   Asymptomatic Sinus bradycardia    a. 05/2016 Zio: Avg HR 61 (41-167).   Benign essential tremor    Cancer ([place]) 2002   melanoma right ear    Colon polyp    COPD (chronic obstructive pulmonary disease) ([place])    pt said he believes it is a misdiagnosis    Coronary artery calcification seen on CT scan    a. 06/2016 CTA Chest: cor Ca2+; b. 05/2017 MV: EF >65%. No ischemia/infarct.   Depression    History of echocardiogram    a. 04/2016 Echo: EF 60-65%, mild conc LVH. Nl PASP.   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 05/2016 Zio: Avg HR 61 (41-167). 9 SVT runs (fastest 167 - 5 beats; longest 8 beats - 101 bpm). Rare PACs/PVCs.   PVC's (premature ventricular contractions)    Rhinitis    Wears dentures    partial upper and lower   Wears hearing aid in both ears     Past Surgical History:  Procedure Laterality Date   ADENOIDECTOMY     CATARACT EXTRACTION W/PHACO Left 10/22/2017   Procedure: CATARACT EXTRACTION PHACO AND INTRAOCULAR LENS PLACEMENT (IOC)  LEFT;  Surgeon: Eulogio Bear, MD;  Location: [place];  Service: Ophthalmology;  Laterality: Left;   CATARACT EXTRACTION W/PHACO Right 11/20/2017   Procedure: CATARACT EXTRACTION PHACO AND INTRAOCULAR LENS PLACEMENT (IOC) RIGHT;  Surgeon: Eulogio Bear, MD;  Location: [place];  Service: Ophthalmology;  Laterality: Right;   ESOPHAGOGASTRODUODENOSCOPY (EGD) WITH PROPOFOL N/A 10/31/2016   Procedure: ESOPHAGOGASTRODUODENOSCOPY (EGD) WITH PROPOFOL;  Surgeon: Lollie Sails, MD;  Location: [facility] ENDOSCOPY;  Service: Endoscopy;  Laterality: N/A;   JOINT REPLACEMENT Right 2003   knee   knee meniscus repair Right    MELANOMA EXCISION Right    ear. Followed by Dr. Nehemiah Massed   PATELLECTOMY Bilateral    TONSILLECTOMY      Family History  Problem Relation Age of Onset   Hypertension  Mother    Heart disease Mother        CHF   Heart disease Father    Cancer Sister        melanoma    SOCIAL HX:  reports that he has never smoked. He has never used smokeless tobacco. He reports current alcohol use. He reports that he does not use drugs.   Current Outpatient Medications:    hydrOXYzine (VISTARIL) 25 MG capsule, Take 1 capsule (25 mg total) by mouth every 8 (eight) hours as needed for anxiety., Disp: 30 capsule, Rfl: 0   sertraline (ZOLOFT) 50 MG tablet, Take 1 tablet (50 mg total) by mouth daily. With a meal., Disp: 30 tablet, Rfl: 3   Alpha-Lipoic Acid 600 MG CAPS, Take by mouth., Disp: , Rfl:    ascorbic Acid (VITAMIN C) 500 MG CPCR, Take by mouth., Disp: , Rfl:    b complex vitamins tablet, Take by mouth., Disp: , Rfl:    buPROPion (WELLBUTRIN XL) 150 MG 24 hr tablet, TAKE 1 TABLET BY MOUTH EVERY DAY, Disp: 90 tablet, Rfl: 1   Cholecalciferol 125 MCG (5000 UT) TABS, Take by mouth., Disp: , Rfl:    COD LIVER OIL PO, Take by mouth., Disp: , Rfl:    Coenzyme Q10 100 MG capsule, Take by mouth., Disp: , Rfl:    cycloSPORINE (RESTASIS) 0.05 % ophthalmic emulsion, Restasis 0.05 % eye drops in a dropperette  INSTILL 1 DROP INTO BOTH EYES TWICE A DAY, Disp: , Rfl:    donepezil (ARICEPT) 5 MG tablet, Take by mouth., Disp: , Rfl:    ezetimibe (ZETIA) 10 MG tablet, Take 1 tablet (10 mg total) by mouth daily., Disp: 90 tablet, Rfl: 3   ipratropium-albuterol (DUONEB) 0.5-2.5 (3) MG/3ML SOLN, Take 3 mLs by nebulization every 6 (six) hours as needed., Disp: 360 mL, Rfl: 0   levothyroxine (SYNTHROID) 100 MCG tablet, TAKE 1 TABLET BY MOUTH EVERY DAY BEFORE BREAKFAST, Disp: 90 tablet, Rfl: 0   magnesium oxide (MAG-OX) 400 MG tablet, Take 400 mg by mouth daily., Disp: , Rfl:    OVER THE COUNTER MEDICATION, Take 4 tablets by mouth daily. Standard Process- Cardio Plus, Disp: , Rfl:    OVER THE COUNTER MEDICATION, Take 1 tablet by mouth daily. Cardio Platinum - Vitamin K-2 500 mg, Arginine  300 mg, Natokinase 100 mg, Disp: , Rfl:    OVER THE COUNTER MEDICATION, Take by mouth daily. Hemp oil, Disp: , Rfl:    Probiotic Product (PROBIOTIC + TURMERIC EXTRACT) 400 MG CAPS, Take by mouth., Disp: , Rfl:    Probiotic Product (PROBIOTIC ACIDOPHILUS) CAPS, Take 1 capsule by mouth daily., Disp: , Rfl:    testosterone cypionate (DEPOTESTOSTERONE CYPIONATE) 200 MG/ML injection, INJECT 0.5 ML (100 MG TOTAL) INTO THE MUSCLE EVERY 14 DAYS. (Patient not taking: Reported on 11/29/2020), Disp: 1 mL, Rfl: 5   Turmeric 400 MG CAPS, Take by mouth., Disp: , Rfl:   EXAM:  General impression: alert, cooperative and articulate.  No signs of being in distress  Lungs: speech is fluent sentence length suggests that patient is not short of breath and not punctuated by cough, sneezing or sniffing. [name]   Psych: affect anxious,  distraught, but   speech is articulate and non pressured .  Denies suicidal thoughts   ASSESSMENT AND PLAN:  Discussed the following assessment and plan:  Anxiety in acute stress reaction  Anxiety in acute stress reaction Triggered by IRS Audit  Likely  To last several months.  Prescribing hydroxyzine for acute symptoms and insomnia along with sertraline.  Advised to start with /2 tablet initially for the first few days.  Follow up in 3 weeks     I discussed the assessment and treatment plan with the patient. The patient was provided an opportunity to ask questions and all were answered. The patient agreed with the plan and demonstrated an understanding of the instructions.   The patient was advised to call back or seek an in-person evaluation if the symptoms worsen or if the condition fails to improve as anticipated.   I spent 20 minutes dedicated to the care of this patient on the date of this encounter to include pre-visit review of his medical history,  Face-to-face time with the patient , and post visit ordering of testing and therapeutics.    [name], MD

## 2020-12-07 NOTE — Assessment & Plan Note (Signed)
Triggered by IRS Audit  Likely  To last several months.  Prescribing hydroxyzine for acute symptoms and insomnia along with sertraline.  Advised to start with /2 tablet initially for the first few days.  Follow up in 3 weeks

## 2020-12-07 NOTE — Telephone Encounter (Signed)
Spoke with pt and he stated that he is in a battle with the IRS right now and it is causing him a lot a stress and anxiety. Pt stated that the anxiety is affecting his ability to function like he normally does. Pt would like to know if something could be sent in to help with the anxiety.

## 2020-12-08 ENCOUNTER — Ambulatory Visit: Payer: PPO | Admitting: Adult Health

## 2020-12-16 ENCOUNTER — Ambulatory Visit: Payer: PPO | Admitting: Internal Medicine

## 2020-12-16 ENCOUNTER — Encounter: Payer: Self-pay | Admitting: Internal Medicine

## 2020-12-16 ENCOUNTER — Ambulatory Visit
Admission: RE | Admit: 2020-12-16 | Discharge: 2020-12-16 | Disposition: A | Discharge status: Home / Self Care | Payer: PPO | Attending: Internal Medicine | Admitting: Internal Medicine

## 2020-12-16 ENCOUNTER — Ambulatory Visit
Admission: RE | Admit: 2020-12-16 | Discharge: 2020-12-16 | Disposition: A | Discharge status: Home / Self Care | Payer: PPO | Source: Ambulatory Visit | Attending: Internal Medicine | Admitting: Internal Medicine

## 2020-12-16 ENCOUNTER — Other Ambulatory Visit: Payer: Self-pay

## 2020-12-16 VITALS — BP 116/62 | HR 69 | Temp 97.7°F | Ht 75.0 in | Wt 172.8 lb

## 2020-12-16 DIAGNOSIS — J441 Chronic obstructive pulmonary disease with (acute) exacerbation: Secondary | ICD-10-CM

## 2020-12-16 DIAGNOSIS — J00 Acute nasopharyngitis [common cold]: Secondary | ICD-10-CM

## 2020-12-16 DIAGNOSIS — R0602 Shortness of breath: Secondary | ICD-10-CM | POA: Insufficient documentation

## 2020-12-16 DIAGNOSIS — H04123 Dry eye syndrome of bilateral lacrimal glands: Secondary | ICD-10-CM | POA: Diagnosis not present

## 2020-12-16 MED ORDER — CETIRIZINE HCL 10 MG PO TABS: 10.0000 mg | ORAL_TABLET | Freq: Every day | ORAL | 6 refills | Status: DC

## 2020-12-16 MED ORDER — FLUTICASONE PROPIONATE 50 MCG/ACT NA SUSP: 2.0000 | Freq: Every day | NASAL | 2 refills | Status: DC

## 2020-12-16 MED ORDER — PREDNISONE 20 MG PO TABS: 20.0000 mg | ORAL_TABLET | Freq: Every day | ORAL | 1 refills | Status: DC

## 2020-12-16 MED ORDER — AZITHROMYCIN 250 MG PO TABS
ORAL_TABLET | ORAL | 0 refills | Status: DC
Start: 2020-12-16 — End: 2020-12-28

## 2020-12-16 NOTE — Patient Instructions (Signed)
Recommend Cardiology assessment for chest pressure  Start pred 20 mg daily for 10 days Start Z pak Start Zyrtec and Flonase  Obtain CXR 2 view

## 2020-12-16 NOTE — Progress Notes (Signed)
   Name: Theodore [name] MRN: [id number] DOB: Nov 20, 1929     CONSULTATION DATE:  12/16/2020   SYNOPSIS established care for COPD Patient uses his own NEB therapy as needed He takes electrodes and uses distilled water and mixes in Bdpec Asc Show Low and uses this in his own nebulizer He calls this concoction Colloidal Silver Nebulizers  He uses this NEB therapy as   Needed and helps a lot with chest congestion and cough This seems to be a form of Saline NEBS  Has PAD follows Vasc surgery He takes OTC EDTA -patient feels that this has helped his vascualr issues    CHIEF COMPLAINT:  Follow-up COPD  HISTORY OF PRESENT ILLNESS:   Patient not feeling well Increased shortness of breath   has chest pressure intermittently  Has a chronic productive cough  Sinus congestion   Will treat as COPD exacerbation with atypical pneumonia  I have contacted cardiology to assess chest pressure      Review of Systems:  Gen: in distress HEENT: Denies blurred vision, double vision, ear pain, eye pain, hearing loss, nose bleeds, sore throat Cardiac:  No dizziness, chest pain or heaviness, chest tightness,edema, No JVD Resp:   + cough, +sputum production, +shortness of breath,-wheezing, -hemoptysis,  Other:  All other systems negative    BP 116/62 (BP Location: Left Arm, Patient Position: Sitting, Cuff Size: Normal)   Pulse 69   Temp 97.7 F (36.5 C) (Oral)   Ht 6\' 3"  (1.905 m)   Wt 172 lb 12.8 oz (78.4 kg)   SpO2 100%   BMI 21.60 kg/m     Physical Examination:   General Appearance: +distress  EYES PERRLA, EOM intact.   NECK Supple, No JVD Pulmonary: normal breath sounds, No wheezing.  CardiovascularNormal S1,S2.  No m/r/g.   Abdomen: Benign, Soft, non-tender. Skin:   warm, no rashes, no ecchymosis  Extremities: normal, no cyanosis, clubbing. Neuro:without focal findings,  speech normal  PSYCHIATRIC: Mood, affect within normal limits.   ALL OTHER ROS ARE NEGATIVE  Chest x-ray  February 2022 Personally reviewed today No significant findings No pneumonia no active disease  CT chest 2018 No significant lung findings seen    ASSESSMENT / PLAN:  87 year old pleasant white male seen today for chronic intermittent shortness of breath and dyspnea exertion related to COPD Gold stage a with allergic rhinitis and reflux disease   At this time patient seems to have a mild COPD exacerbation likely from atypical pneumonia however with concerning his intermittent chest pressure he has been having for the last month  Will treat as COPD exacerbation with prednisone and antibiotics at this time Continue albuterol as needed  Allergic rhinitis with postnasal drip Start Zyrtec and Flonase  Chest pressure intermittent Follow-up with cardiology as soon as possible      MEDICATION ADJUSTMENTS/LABS AND TESTS ORDERED: Recommend Cardiology assessment for chest pressure  Start pred 20 mg daily for 10 days Start Z pak Start Zyrtec and Flonase  Obtain CXR 2 view   CURRENT MEDICATIONS REVIEWED AT LENGTH WITH PATIENT TODAY   Patient satisfied with Plan of action and management. All questions answered  Follow up in 3 months Total time spent 25 minutes   Maretta Bees Patricia Pesa, M.D.  [facility]  Medical Director [place] Director [facility] Cardio-Pulmonary Department

## 2020-12-21 ENCOUNTER — Other Ambulatory Visit: Payer: Self-pay

## 2020-12-21 ENCOUNTER — Ambulatory Visit (INDEPENDENT_AMBULATORY_CARE_PROVIDER_SITE_OTHER): Payer: PPO

## 2020-12-21 DIAGNOSIS — Z23 Encounter for immunization: Secondary | ICD-10-CM | POA: Diagnosis not present

## 2020-12-22 DIAGNOSIS — I129 Hypertensive chronic kidney disease with stage 1 through stage 4 chronic kidney disease, or unspecified chronic kidney disease: Secondary | ICD-10-CM | POA: Diagnosis not present

## 2020-12-22 DIAGNOSIS — N1832 Chronic kidney disease, stage 3b: Secondary | ICD-10-CM | POA: Diagnosis not present

## 2020-12-22 DIAGNOSIS — R319 Hematuria, unspecified: Secondary | ICD-10-CM | POA: Diagnosis not present

## 2020-12-28 ENCOUNTER — Other Ambulatory Visit: Payer: Self-pay

## 2020-12-28 ENCOUNTER — Other Ambulatory Visit: Payer: Self-pay | Admitting: Internal Medicine

## 2020-12-28 ENCOUNTER — Ambulatory Visit
Admission: EM | Admit: 2020-12-28 | Discharge: 2020-12-28 | Disposition: A | Discharge status: Home / Self Care | Payer: PPO | Attending: Emergency Medicine | Admitting: Emergency Medicine

## 2020-12-28 ENCOUNTER — Emergency Department: Payer: PPO

## 2020-12-28 ENCOUNTER — Telehealth: Payer: Self-pay | Admitting: Internal Medicine

## 2020-12-28 ENCOUNTER — Emergency Department
Admission: EM | Admit: 2020-12-28 | Discharge: 2020-12-28 | Disposition: A | Discharge status: Home / Self Care | Payer: PPO | Attending: Emergency Medicine | Admitting: Emergency Medicine

## 2020-12-28 DIAGNOSIS — R55 Syncope and collapse: Secondary | ICD-10-CM | POA: Diagnosis not present

## 2020-12-28 DIAGNOSIS — I2510 Atherosclerotic heart disease of native coronary artery without angina pectoris: Secondary | ICD-10-CM | POA: Diagnosis not present

## 2020-12-28 DIAGNOSIS — R42 Dizziness and giddiness: Secondary | ICD-10-CM | POA: Diagnosis not present

## 2020-12-28 DIAGNOSIS — J449 Chronic obstructive pulmonary disease, unspecified: Secondary | ICD-10-CM | POA: Diagnosis not present

## 2020-12-28 DIAGNOSIS — Z5321 Procedure and treatment not carried out due to patient leaving prior to being seen by health care provider: Secondary | ICD-10-CM | POA: Insufficient documentation

## 2020-12-28 DIAGNOSIS — E039 Hypothyroidism, unspecified: Secondary | ICD-10-CM | POA: Diagnosis not present

## 2020-12-28 DIAGNOSIS — R531 Weakness: Secondary | ICD-10-CM | POA: Insufficient documentation

## 2020-12-28 DIAGNOSIS — R0602 Shortness of breath: Secondary | ICD-10-CM

## 2020-12-28 DIAGNOSIS — N183 Chronic kidney disease, stage 3 unspecified: Secondary | ICD-10-CM | POA: Diagnosis not present

## 2020-12-28 DIAGNOSIS — Z20822 Contact with and (suspected) exposure to covid-19: Secondary | ICD-10-CM | POA: Diagnosis not present

## 2020-12-28 DIAGNOSIS — Z8679 Personal history of other diseases of the circulatory system: Secondary | ICD-10-CM | POA: Diagnosis not present

## 2020-12-28 DIAGNOSIS — Z85828 Personal history of other malignant neoplasm of skin: Secondary | ICD-10-CM | POA: Diagnosis not present

## 2020-12-28 LAB — CBC WITH DIFFERENTIAL/PLATELET
Abs Immature Granulocytes: 0.01 10*3/uL (ref 0.00–0.07)
Basophils Absolute: 0 10*3/uL (ref 0.0–0.1)
Basophils Relative: 1 %
Eosinophils Absolute: 0.2 10*3/uL (ref 0.0–0.5)
Eosinophils Relative: 3 %
HCT: 44.1 % (ref 39.0–52.0)
Hemoglobin: 15.3 g/dL (ref 13.0–17.0)
Immature Granulocytes: 0 %
Lymphocytes Relative: 21 %
Lymphs Abs: 1.3 10*3/uL (ref 0.7–4.0)
MCH: 33.7 pg (ref 26.0–34.0)
MCHC: 34.7 g/dL (ref 30.0–36.0)
MCV: 97.1 fL (ref 80.0–100.0)
Monocytes Absolute: 0.4 10*3/uL (ref 0.1–1.0)
Monocytes Relative: 7 %
Neutro Abs: 4.3 10*3/uL (ref 1.7–7.7)
Neutrophils Relative %: 68 %
Platelets: 219 10*3/uL (ref 150–400)
RBC: 4.54 MIL/uL (ref 4.22–5.81)
RDW: 13.2 % (ref 11.5–15.5)
WBC: 6.3 10*3/uL (ref 4.0–10.5)
nRBC: 0 % (ref 0.0–0.2)

## 2020-12-28 LAB — BASIC METABOLIC PANEL
Anion gap: 8 (ref 5–15)
BUN: 31 mg/dL — ABNORMAL HIGH (ref 8–23)
CO2: 28 mmol/L (ref 22–32)
Calcium: 8.7 mg/dL — ABNORMAL LOW (ref 8.9–10.3)
Chloride: 99 mmol/L (ref 98–111)
Creatinine, Ser: 1.39 mg/dL — ABNORMAL HIGH (ref 0.61–1.24)
GFR, Estimated: 48 mL/min — ABNORMAL LOW (ref >60)
Glucose, Bld: 117 mg/dL — ABNORMAL HIGH (ref 70–99)
Potassium: 3.8 mmol/L (ref 3.5–5.1)
Sodium: 135 mmol/L (ref 135–145)

## 2020-12-28 LAB — BRAIN NATRIURETIC PEPTIDE: B Natriuretic Peptide: 91.7 pg/mL (ref 0.0–100.0)

## 2020-12-28 NOTE — ED Notes (Signed)
Patient is being discharged from the Urgent Care and sent to the Emergency Department via private vehicle . Per K TATE NP, patient is in need of higher level of care due to need for further eval. Patient is aware and verbalizes understanding of plan of care.  Vitals:   12/28/20 1636  BP: (!) 145/68  Pulse: (!) 55  Resp: 18  Temp: (!) 97.5 F (36.4 C)  SpO2: 98%

## 2020-12-28 NOTE — Telephone Encounter (Signed)
Theodore Lipps, MD to Me     2:31 PM  Highly recommend OV, or UC or ER visit. OV with any provider anywhere     Pt and his family member were notified of response per Dr Mortimer Fries. No openings in [place] today and he could not make appt time here in [place]. Family member stated would take him to ED now.

## 2020-12-28 NOTE — ED Provider Notes (Signed)
Emergency Medicine Provider Triage Evaluation Note  Theodore [name], a 87 y.o. male  was evaluated in triage.  Pt complains of shortness of breath and dizziness for the last decrease quantity the patient.  Apparently had worsening of her symptoms over the last 2 days, and make contact with his neurologist, who recommend he report to the ED for further evaluation.  Patient initially reported here, but did not check in secondary to the protracted wait.  He subsequently reported to [facility], where he was evaluated initially, and again advised to report to the ED for further management of his symptoms.  Review of Systems  Positive: SOB, weakness Negative: CP  Physical Exam  BP (!) 123/57 (BP Location: Left Arm)   Pulse (!) 52   Temp 97.6 F (36.4 C) (Oral)   Resp 20   Ht 6\' 3"  (1.905 m)   Wt 77.1 kg   SpO2 100%   BMI 21.25 kg/m  Gen:   Awake, no distress  NAD Resp:  Normal effort CTA MSK:   Moves extremities without difficulty  Other:  CVS: brady rate  Medical Decision Making  Medically screening exam initiated at 5:55 PM.  Appropriate orders placed.  Theodore [name] was informed that the remainder of the evaluation will be completed by another provider, this initial triage assessment does not replace that evaluation, and the importance of remaining in the ED until their evaluation is complete.  Geriatric patient with ED evaluation of several weeks of generalized weakness and shortness of breath.  Noting increased symptoms over the last 2 days.   Theodore Needles, PA-C 12/28/20 1757    Theodore Dark, MD 12/28/20 2311

## 2020-12-28 NOTE — ED Triage Notes (Signed)
Pt presents with episodes of dizziness, SOB, weakness in legs x 6 weeks.  Episode of near syncope today.  Pulmonologist referred to ED for workup but pt felt he would have long wait at ED.

## 2020-12-28 NOTE — Discharge Instructions (Addendum)
Go to the emergency department for evaluation of your symptoms.    

## 2020-12-28 NOTE — ED Provider Notes (Signed)
Theodore [name]    CSN: [id number] Arrival date & time: 12/28/20  1626      History   Chief Complaint Chief Complaint  Patient presents with   Shortness of Breath    HPI Theodore [name] is a 87 y.o. male.  Accompanied by his wife, patient presents with dizziness and shortness of breath x 2 days; worse today.  Episode of near syncope today after walking up a flight of stairs.  He also reports weakness in his legs for several weeks.  Contacted his pulmonologist office today and was instructed to go the ED immediately; he went to the ED but left due to the wait time.  He denies chest pain, focal weakness, or other symptoms.  His medical history includes CAD, bradycardia, PVCs, palpitations, COPD, CKD.  The history is provided by the patient, the spouse and medical records.   Past Medical History:  Diagnosis Date   Arthritis    knees   Asymptomatic Sinus bradycardia    a. 05/2016 Zio: Avg HR 61 (41-167).   Benign essential tremor    Cancer ([place]) 2002   melanoma right ear   Colon polyp    COPD (chronic obstructive pulmonary disease) ([place])    pt said he believes it is a misdiagnosis    Coronary artery calcification seen on CT scan    a. 06/2016 CTA Chest: cor Ca2+; b. 05/2017 MV: EF >65%. No ischemia/infarct.   Depression    History of echocardiogram    a. 04/2016 Echo: EF 60-65%, mild conc LVH. Nl PASP.   History of kidney stones    Hx of dysplastic nevus 05/07/2017   L anterior shoulder, severe   Hx of dysplastic nevus 09/06/2020   R lower sternum, moderate atypia   Hypothyroidism    Hypothyroidism    Melanoma ([place]) 1990's per pt   R ear   Melanoma in situ ([place]) 01/12/2014   left jaw   Palpitations    a. 05/2016 Zio: Avg HR 61 (41-167). 9 SVT runs (fastest 167 - 5 beats; longest 8 beats - 101 bpm). Rare PACs/PVCs.   PVC's (premature ventricular contractions)    Rhinitis    Wears dentures    partial upper and lower   Wears hearing aid in both ears     Patient  Active Problem List   Diagnosis Date Noted   Anxiety in acute stress reaction 12/07/2020   CKD (chronic kidney disease) stage 3, GFR 30-59 ml/min (HCC) 12/01/2020   Mild cognitive impairment with memory loss 11/30/2020   Elevated PSA, between 10 and less than 20 ng/ml 11/30/2020   COVID-19 virus infection 04/06/2020   Acquired trigger finger of left middle finger 08/07/2019   Carpal tunnel syndrome of right wrist 07/10/2019   Pain in right hand 06/05/2019   Ulnar neuropathy 06/05/2019   Essential tremor 05/28/2019   Disorder of bursae of shoulder region 01/29/2019   Localized, primary osteoarthritis of shoulder region 01/29/2019   Localized, secondary osteoarthritis of the shoulder region 01/29/2019   Osteoarthritis of knee 01/29/2019   Shoulder joint pain 01/29/2019   Chronic obstructive pulmonary disease ([place]) 09/24/2018   Grief 09/02/2018   Cervical radiculopathy 08/14/2018   Neck pain 08/12/2018   Sinus bradycardia 03/21/2018   DOE (dyspnea on exertion) 03/21/2018   PVC (premature ventricular contraction) 03/21/2018   Weakness of both lower extremities 08/09/2017   Dysphagia 09/05/2016   Ringworm, body 06/27/2016   Bradycardia 05/10/2016   Coronary artery disease involving native coronary artery  of native heart with angina pectoris ([place])    PAD (peripheral artery disease) ([place])    Asthma 05/09/2016   Anterior knee pain, left 12/26/2015   Hypothyroidism 07/04/2014   Hyperlipidemia LDL goal <100 07/04/2014   Nonallergic vasomotor rhinitis 07/01/2014   Long-term use of high-risk medication 01/16/2014   Hypertrophy of prostate with urinary obstruction and other lower urinary tract symptoms (LUTS) 08/08/2013   Hypogonadism male 08/08/2013   Vitamin D deficiency 07/31/2013   Depression with anxiety 04/28/2013   Low serum testosterone level 04/28/2013   Actinic keratosis 05/10/2010   History of malignant melanoma of skin 05/10/2010   Inflamed seborrheic keratosis 05/10/2010     Past Surgical History:  Procedure Laterality Date   ADENOIDECTOMY     CATARACT EXTRACTION W/PHACO Left 10/22/2017   Procedure: CATARACT EXTRACTION PHACO AND INTRAOCULAR LENS PLACEMENT ([place])  LEFT;  Surgeon: Eulogio Bear, MD;  Location: [facility];  Service: Ophthalmology;  Laterality: Left;   CATARACT EXTRACTION W/PHACO Right 11/20/2017   Procedure: CATARACT EXTRACTION PHACO AND INTRAOCULAR LENS PLACEMENT ([facility]) RIGHT;  Surgeon: Eulogio Bear, MD;  Location: [facility];  Service: Ophthalmology;  Laterality: Right;   ESOPHAGOGASTRODUODENOSCOPY (EGD) WITH PROPOFOL N/A 10/31/2016   Procedure: ESOPHAGOGASTRODUODENOSCOPY (EGD) WITH PROPOFOL;  Surgeon: Lollie Sails, MD;  Location[facility] ENDOSCOPY;  Service: Endoscopy;  Laterality: N/A;   JOINT REPLACEMENT Right 2003   knee   knee meniscus repair Right    MELANOMA EXCISION Right    ear. Followed by Dr. Nehemiah Massed   PATELLECTOMY Bilateral    TONSILLECTOMY         Home Medications    Prior to Admission medications   Medication Sig Start Date End Date Taking? Authorizing Provider  Alpha-Lipoic Acid 600 MG CAPS Take by mouth.    [provider]  ascorbic Acid (VITAMIN C) 500 MG CPCR Take by mouth.    [provider]  b complex vitamins tablet Take by mouth.    [provider]  buPROPion (WELLBUTRIN XL) 150 MG 24 hr tablet TAKE 1 TABLET BY MOUTH EVERY DAY 11/18/20   [name], MD  cetirizine (ZYRTEC ALLERGY) 10 MG tablet Take 1 tablet (10 mg total) by mouth daily. 12/16/20   Flora Lipps, MD  Cholecalciferol 125 MCG (5000 UT) TABS Take by mouth.    [provider]  COD LIVER OIL PO Take by mouth.    [provider]  Coenzyme Q10 100 MG capsule Take by mouth.    [provider]  cycloSPORINE (RESTASIS) 0.05 % ophthalmic emulsion Restasis 0.05 % eye drops in a dropperette  INSTILL 1 DROP INTO BOTH EYES TWICE A DAY    [provider]  donepezil  (ARICEPT) 5 MG tablet Take by mouth. 07/03/19   [provider]  ezetimibe (ZETIA) 10 MG tablet Take 1 tablet (10 mg total) by mouth daily. 08/25/20   Minna Merritts, MD  fluticasone (FLONASE) 50 MCG/ACT nasal spray Place 2 sprays into both nostrils daily. 12/16/20 12/16/21  Flora Lipps, MD  hydrOXYzine (VISTARIL) 25 MG capsule Take 1 capsule (25 mg total) by mouth every 8 (eight) hours as needed for anxiety. 12/07/20   [name], MD  ipratropium-albuterol (DUONEB) 0.5-2.5 (3) MG/3ML SOLN Take 3 mLs by nebulization every 6 (six) hours as needed. 04/06/20   [name], MD  levothyroxine (SYNTHROID) 100 MCG tablet TAKE 1 TABLET BY MOUTH EVERY DAY BEFORE BREAKFAST 12/28/20   [name], MD  magnesium  oxide (MAG-OX) 400 MG tablet Take 400 mg by mouth daily.    [provider]  OVER THE COUNTER MEDICATION Take 4 tablets by mouth daily. Standard Process- Cardio Plus    [provider]  OVER THE COUNTER MEDICATION Take 1 tablet by mouth daily. Cardio Platinum - Vitamin K-2 500 mg, Arginine 300 mg, Natokinase 100 mg    [provider]  OVER THE COUNTER MEDICATION Take by mouth daily. Hemp oil    [provider]  Probiotic Product (PROBIOTIC + TURMERIC EXTRACT) 400 MG CAPS Take by mouth.    [provider]  Probiotic Product (PROBIOTIC ACIDOPHILUS) CAPS Take 1 capsule by mouth daily.    [provider]  sertraline (ZOLOFT) 50 MG tablet Take 1 tablet (50 mg total) by mouth daily. With a meal. 12/07/20   [name], MD  testosterone cypionate (DEPOTESTOSTERONE CYPIONATE) 200 MG/ML injection INJECT 0.5 ML (100 MG TOTAL) INTO THE MUSCLE EVERY 14 DAYS. 10/06/20   [name], MD  Turmeric 400 MG CAPS Take by mouth.    [provider]    Family History Family History  Problem Relation Age of Onset   Hypertension Mother    Heart disease Mother        CHF   Heart disease Father    Cancer Sister        melanoma     Social History Social History   Tobacco Use   Smoking status: Never   Smokeless tobacco: Never  Vaping Use   Vaping Use: Never used  Substance Use Topics   Alcohol use: Yes    Comment: 1 glass of wine daily   Drug use: No     Allergies   Penicillins and Lexapro [escitalopram]   Review of Systems Review of Systems  Constitutional:  Negative for chills and fever.  Respiratory:  Positive for shortness of breath. Negative for cough.   Cardiovascular:  Negative for chest pain and palpitations.  Gastrointestinal:  Negative for abdominal pain and vomiting.  Skin:  Negative for color change and rash.  Neurological:  Positive for dizziness. Negative for syncope, facial asymmetry, speech difficulty, weakness and numbness.  All other systems reviewed and are negative.   Physical Exam Triage Vital Signs ED Triage Vitals  Enc Vitals Group     BP      Pulse      Resp      Temp      Temp src      SpO2      Weight      Height      Head Circumference      Peak Flow      Pain Score      Pain Loc      Pain Edu?      Excl. in [place]?    No data found.  Updated Vital Signs BP (!) 145/68 (BP Location: Left Arm)   Pulse (!) 55   Temp (!) 97.5 F (36.4 C) (Oral)   Resp 18   SpO2 98%   Visual Acuity Right Eye Distance:   Left Eye Distance:   Bilateral Distance:    Right Eye Near:   Left Eye Near:    Bilateral Near:     Physical Exam Vitals and nursing note reviewed.  Constitutional:      General: He is not in acute distress.    Appearance: He is well-developed.  HENT:     Head: Normocephalic and atraumatic.  Mouth/Throat:     Mouth: Mucous membranes are moist.  Eyes:     Conjunctiva/sclera: Conjunctivae normal.  Cardiovascular:     Rate and Rhythm: Normal rate and regular rhythm.     Heart sounds: Normal heart sounds.  Pulmonary:     Effort: Pulmonary effort is normal. No respiratory distress.     Breath sounds: Normal breath sounds.  Abdominal:      Palpations: Abdomen is soft.     Tenderness: There is no abdominal tenderness.  Musculoskeletal:     Cervical back: Neck supple.     Right lower leg: No edema.     Left lower leg: No edema.  Skin:    General: Skin is warm and dry.  Neurological:     General: No focal deficit present.     Mental Status: He is alert and oriented to person, place, and time.     Sensory: No sensory deficit.     Gait: Gait normal.     Comments: Generalized weakness.  No focal weakness.  Psychiatric:        Mood and Affect: Mood normal.        Behavior: Behavior normal.     UC Treatments / Results  Labs (all labs ordered are listed, but only abnormal results are displayed) Labs Reviewed - No data to display  EKG   Radiology No results found.  Procedures Procedures (including critical care time)  Medications Ordered in UC Medications - No data to display  Initial Impression / Assessment and Plan / UC Course  I have reviewed the triage vital signs and the nursing notes.  Pertinent labs & imaging results that were available during my care of the patient were reviewed by me and considered in my medical decision making (see chart for details).  Near syncope, dizziness, shortness of breath.  EKG shows sinus bradycardia, rate 53, no ST elevation, compared to previous from 08/25/2020.  No focal weakness.  He denies chest pain.  The dizziness and shortness of breath is intermittent and worse with exertion.  Discussed limitations of evaluation in an urgent care setting.  Instructed patient to go to the ED for evaluation.  He declines EMS.  His wife is with him and will take him to the ED.   Final Clinical Impressions(s) / UC Diagnoses   Final diagnoses:  Near syncope  Dizziness  Shortness of breath     Discharge Instructions      Go to the emergency department for evaluation of your symptoms.     ED Prescriptions   None    PDMP not reviewed this encounter.   Sharion Balloon,  NP 12/28/20 1714

## 2020-12-28 NOTE — ED Triage Notes (Signed)
Pt to ED for shob and dizziness for 6 weeks. States very shaky. Denies fevers, cough. NAD noted. Speaking in complete sentences, alert and oriented.  Sent from [facility]

## 2020-12-28 NOTE — Telephone Encounter (Signed)
I called and spoke with the pt  He is c/o increased DOE x 2 days, esp worse today  He states he walked up 1 flight if stairs today and felt dizzy and SOB, "though I was gonna pass out" He used his duoneb and this helped slightly  He feels the pred given at last ov did not help with his breathing  He finished this and zpack already  Taking zyrtrec daily  He is supposed to see cards as we requested on 01/17/21  Pt denies any f/c/s, aches, wheezing, cough  He does still have the chest tightness that he talked about at the last visit- no better or worse  Please advise thanks

## 2020-12-29 ENCOUNTER — Telehealth: Payer: Self-pay | Admitting: Internal Medicine

## 2020-12-29 ENCOUNTER — Other Ambulatory Visit: Payer: Self-pay | Admitting: Internal Medicine

## 2020-12-29 NOTE — Telephone Encounter (Signed)
I called the patient and he voices understanding. He is going to make a follow up with Neurology.  He did not have any other questions. Nothing further needed.

## 2020-12-29 NOTE — Telephone Encounter (Signed)
Spoke with pt who states he went to [facility] ED yesterday for evaluation but never saw a MD. Per encounter on 12/28/20 Dr. Mortimer Fries recommended OV with anyone. Pt was scheduled with Geraldo Pitter on 12/31/20 at 10 am. Pt is aware that appointment is in [place] and states understanding. Nothing further needed at this time.   Routing to [employer]  as Juluis Rainier

## 2020-12-29 NOTE — Telephone Encounter (Signed)
Make sure he contacts his neurologist about dizziness. Will likely need EKG during visit with me.

## 2020-12-30 DIAGNOSIS — H04123 Dry eye syndrome of bilateral lacrimal glands: Secondary | ICD-10-CM | POA: Diagnosis not present

## 2020-12-30 DIAGNOSIS — J343 Hypertrophy of nasal turbinates: Secondary | ICD-10-CM | POA: Diagnosis not present

## 2020-12-30 DIAGNOSIS — J300 Vasomotor rhinitis: Secondary | ICD-10-CM | POA: Diagnosis not present

## 2020-12-30 DIAGNOSIS — H903 Sensorineural hearing loss, bilateral: Secondary | ICD-10-CM | POA: Diagnosis not present

## 2020-12-31 ENCOUNTER — Ambulatory Visit (INDEPENDENT_AMBULATORY_CARE_PROVIDER_SITE_OTHER): Payer: PPO | Admitting: Primary Care

## 2020-12-31 ENCOUNTER — Other Ambulatory Visit: Payer: Self-pay

## 2020-12-31 ENCOUNTER — Telehealth: Payer: Self-pay | Admitting: Internal Medicine

## 2020-12-31 DIAGNOSIS — R42 Dizziness and giddiness: Secondary | ICD-10-CM | POA: Insufficient documentation

## 2020-12-31 DIAGNOSIS — J449 Chronic obstructive pulmonary disease, unspecified: Secondary | ICD-10-CM

## 2020-12-31 NOTE — Telephone Encounter (Signed)
Patient having labs on Monday wants to make sure a GFR  and PSA  are included in those labs

## 2020-12-31 NOTE — Assessment & Plan Note (Addendum)
-   Patient follows with Dr. Mortimer Fries for COPD. PFTs in 2018 showed moderate obstructive airways disease with significant air trapping and hyperinflation. He is not on maintenance medication. I do not have high clinical suspicion his current dyspnea symptoms are related to underlying pulmonary disease. He has no associated respiratory symptoms. Recommend therapeutic trial scheduled ipratropium-albuterol nebulizer twice a day for 1 week. If no benefit he can stop.

## 2020-12-31 NOTE — Patient Instructions (Addendum)
I suspect your dizziness and sob are related to your heart and bradycardia. Recommend you follow-up with cardiology. They likely will order some testing and may want you to wear a cardiac monitor   Recommendations: - Push oral fluids - Use ipratropium-albuterol morning and evening x 1 week (if you do not see any improvement in your breathing with regular use you can go back to as needed use)   Follow-up: - 3 months with Dr. Mortimer Fries or sooner if needed

## 2020-12-31 NOTE — Progress Notes (Signed)
@Patient  ID: Theodore [name], male    DOB: 1929-09-13, 87 y.o.   MRN: [id number]  Chief Complaint  Patient presents with   Hospitalization Follow-up    Left hospital without being seen pt did get two ekgs    Shortness of Breath    Worse on Tuesday after walking up the stairs pt stated he was very dizzy     Referring provider: [name], MD  HPI: 87 year old male, never smoked.  Past medical history significant for TD/asthma, coronary artery disease, seizure, essential tremor, chronic kidney disease stage III, hyperlipidemia, PTSD.  Patient of Dr. Mortimer Fries, last seen in office on 12/16/2020.  12/31/2020 Patient presents today for an acute office visit.  During his last office visit with Dr. Mortimer Fries on 10/6 he was given Zpack and prednisone 20mg  x 10 days for COPD exacerbation. He went to the ED on 12/28/20  with shortness of breath, spent 10 hours there and left before being seen. Chest x-ray 10/18 showed no acute abnormality.  Patient continues to have intermittent symptoms of shortness of breath, dizziness and leg weakness. He had EKG's that showed sinus bradycardia. He was able to play his music for 2 hours last night. He was able to get through it but was tired. Ipratropium-albuterol nebulizer seems to help a little bit. He has a lot of post nasal drip, he recently saw ENT. He is using Flonase nasal spray as directed but stopped Zyrtec. He has an apt with cardiology next week. He follows with neurology for essential tremor. Denies f/c/s, purulent mucus, chest pain, chest tightness or wheezing.   Orthostatic BP was negative but he was symptomatic 118/60 left arm   Allergies  Allergen Reactions   Penicillins Rash and Other (See Comments)    Other reaction(s): Other (see comments) Other reaction(s): UNKNOWN   Lexapro [Escitalopram] Other (See Comments)    Increased hand tremor    Immunization History  Administered Date(s) Administered   Fluad Quad(high Dose 65+) 12/13/2018,  12/21/2020   Influenza,inj,Quad PF,6+ Mos 01/14/2014, 01/25/2018   Influenza-Unspecified 12/11/2012, 12/26/2019   Moderna Sars-Covid-2 Vaccination 04/25/2019, 05/16/2019, 06/26/2019, 01/17/2020   Pneumococcal Conjugate-13 01/14/2014, 10/04/2018, 12/13/2018   Pneumococcal Polysaccharide-23 04/28/2012   Tdap 08/19/2014   Zoster Recombinat (Shingrix) 03/12/2017   Zoster, Live 04/28/2010    Past Medical History:  Diagnosis Date   Arthritis    knees   Asymptomatic Sinus bradycardia    a. 05/2016 Zio: Avg HR 61 (41-167).   Benign essential tremor    Cancer ([place]) 2002   melanoma right ear   Colon polyp    COPD (chronic obstructive pulmonary disease) ([place])    pt said he believes it is a misdiagnosis    Coronary artery calcification seen on CT scan    a. 06/2016 CTA Chest: cor Ca2+; b. 05/2017 MV: EF >65%. No ischemia/infarct.   Depression    History of echocardiogram    a. 04/2016 Echo: EF 60-65%, mild conc LVH. Nl PASP.   History of kidney stones    Hx of dysplastic nevus 05/07/2017   L anterior shoulder, severe   Hx of dysplastic nevus 09/06/2020   R lower sternum, moderate atypia   Hypothyroidism    Hypothyroidism    Melanoma ([place]) 1990's per pt   R ear   Melanoma in situ ([place]) 01/12/2014   left jaw   Palpitations    a. 05/2016 Zio: Avg HR 61 (41-167). 9 SVT runs (fastest 167 - 5 beats; longest 8 beats - 101  bpm). Rare PACs/PVCs.   PVC's (premature ventricular contractions)    Rhinitis    Wears dentures    partial upper and lower   Wears hearing aid in both ears     Tobacco History: Social History   Tobacco Use  Smoking Status Never  Smokeless Tobacco Never   Counseling given: Not Answered   Outpatient Medications Prior to Visit  Medication Sig Dispense Refill   Alpha-Lipoic Acid 600 MG CAPS Take by mouth.     ascorbic Acid (VITAMIN C) 500 MG CPCR Take by mouth.     b complex vitamins tablet Take by mouth.     buPROPion (WELLBUTRIN XL) 150 MG 24 hr tablet TAKE 1  TABLET BY MOUTH EVERY DAY 90 tablet 1   cetirizine (ZYRTEC ALLERGY) 10 MG tablet Take 1 tablet (10 mg total) by mouth daily. 30 tablet 6   Cholecalciferol 125 MCG (5000 UT) TABS Take by mouth.     COD LIVER OIL PO Take by mouth.     Coenzyme Q10 100 MG capsule Take by mouth.     cycloSPORINE (RESTASIS) 0.05 % ophthalmic emulsion Restasis 0.05 % eye drops in a dropperette  INSTILL 1 DROP INTO BOTH EYES TWICE A DAY     donepezil (ARICEPT) 5 MG tablet Take by mouth.     ezetimibe (ZETIA) 10 MG tablet Take 1 tablet (10 mg total) by mouth daily. 90 tablet 3   fluorometholone (FML) 0.1 % ophthalmic suspension Place 1 drop into the right eye 2 (two) times daily.     fluticasone (FLONASE) 50 MCG/ACT nasal spray Place 2 sprays into both nostrils daily. 1 g 2   hydrOXYzine (VISTARIL) 25 MG capsule Take 1 capsule (25 mg total) by mouth every 8 (eight) hours as needed for anxiety. 30 capsule 0   ipratropium-albuterol (DUONEB) 0.5-2.5 (3) MG/3ML SOLN Take 3 mLs by nebulization every 6 (six) hours as needed. 360 mL 0   levothyroxine (SYNTHROID) 100 MCG tablet TAKE 1 TABLET BY MOUTH EVERY DAY BEFORE BREAKFAST 90 tablet 0   magnesium oxide (MAG-OX) 400 MG tablet Take 400 mg by mouth daily.     OVER THE COUNTER MEDICATION Take 4 tablets by mouth daily. Standard Process- Cardio Plus     OVER THE COUNTER MEDICATION Take 1 tablet by mouth daily. Cardio Platinum - Vitamin K-2 500 mg, Arginine 300 mg, Natokinase 100 mg     OVER THE COUNTER MEDICATION Take by mouth daily. Hemp oil     Probiotic Product (PROBIOTIC + TURMERIC EXTRACT) 400 MG CAPS Take by mouth.     Probiotic Product (PROBIOTIC ACIDOPHILUS) CAPS Take 1 capsule by mouth daily.     sertraline (ZOLOFT) 50 MG tablet TAKE 1 TABLET (50 MG TOTAL) BY MOUTH DAILY. WITH A MEAL. 90 tablet 2   testosterone cypionate (DEPOTESTOSTERONE CYPIONATE) 200 MG/ML injection INJECT 0.5 ML (100 MG TOTAL) INTO THE MUSCLE EVERY 14 DAYS. 1 mL 5   Turmeric 400 MG CAPS Take by  mouth.     No facility-administered medications prior to visit.    Review of Systems  Review of Systems  Constitutional:  Positive for fatigue.  HENT:  Positive for postnasal drip.   Respiratory:  Positive for shortness of breath. Negative for cough, chest tightness and wheezing.   Neurological:  Positive for dizziness.    Physical Exam  BP 100/76   Pulse (!) 50   Ht 6\' 3"  (1.905 m)   Wt 172 lb (78 kg)   SpO2 98%  BMI 21.50 kg/m  Physical Exam Constitutional:      Appearance: Normal appearance.  HENT:     Head: Normocephalic and atraumatic.  Cardiovascular:     Rate and Rhythm: Bradycardia present.  Pulmonary:     Effort: Pulmonary effort is normal.     Breath sounds: Normal breath sounds. No wheezing, rhonchi or rales.     Comments: CTA Skin:    General: Skin is warm and dry.  Neurological:     General: No focal deficit present.     Mental Status: He is alert and oriented to person, place, and time. Mental status is at baseline.  Psychiatric:        Mood and Affect: Mood normal.        Behavior: Behavior normal.        Thought Content: Thought content normal.        Judgment: Judgment normal.     Lab Results:  CBC    Component Value Date/Time   WBC 6.3 12/28/2020 1749   RBC 4.54 12/28/2020 1749   HGB 15.3 12/28/2020 1749   HCT 44.1 12/28/2020 1749   PLT 219 12/28/2020 1749   MCV 97.1 12/28/2020 1749   MCH 33.7 12/28/2020 1749   MCHC 34.7 12/28/2020 1749   RDW 13.2 12/28/2020 1749   LYMPHSABS 1.3 12/28/2020 1749   MONOABS 0.4 12/28/2020 1749   EOSABS 0.2 12/28/2020 1749   BASOSABS 0.0 12/28/2020 1749    BMET    Component Value Date/Time   NA 135 12/28/2020 1749   K 3.8 12/28/2020 1749   CL 99 12/28/2020 1749   CO2 28 12/28/2020 1749   GLUCOSE 117 (H) 12/28/2020 1749   BUN 31 (H) 12/28/2020 1749   CREATININE 1.39 (H) 12/28/2020 1749   CREATININE 0.92 02/03/2015 1504   CALCIUM 8.7 (L) 12/28/2020 1749   GFRNONAA 48 (L) 12/28/2020 1749    GFRNONAA 77 02/03/2015 1504   GFRAA >60 05/10/2016 0236   GFRAA 89 02/03/2015 1504    BNP    Component Value Date/Time   BNP 91.7 12/28/2020 1749    ProBNP No results found for: PROBNP  Imaging: DG Chest 2 View  Result Date: 12/28/2020 CLINICAL DATA:  Shortness of breath EXAM: CHEST - 2 VIEW COMPARISON:  12/16/2020 FINDINGS: The heart size and mediastinal contours are within normal limits. Both lungs are clear. The visualized skeletal structures are unremarkable. IMPRESSION: Normal study. Electronically Signed   By: Rolm Baptise M.D.   On: 12/28/2020 18:29   DG Chest 2 View  Result Date: 12/16/2020 CLINICAL DATA:  87 year old male with history of shortness of breath over the past 2-3 days. EXAM: CHEST - 2 VIEW COMPARISON:  Chest x-ray 04/14/2020. FINDINGS: Lung volumes are normal. No consolidative airspace disease. No pleural effusions. No pneumothorax. No pulmonary nodule or mass noted. Pulmonary vasculature and the cardiomediastinal silhouette are within normal limits. IMPRESSION: No radiographic evidence of acute cardiopulmonary disease. Electronically Signed   By: Vinnie Langton M.D.   On: 12/16/2020 11:10     Assessment & Plan:   Dizziness - Suspect this is related to his bradycardia. Orthostatic BP was negative but he was symptomatic. EKG showed bradycardia while in the ED. HR today 50. He is not on any antihypertensive medication. He will be seeing cardiology next week, may need additional testing including holter monitor. If dizziness not cardiac related he will need to follow-up with neurology.   Chronic obstructive pulmonary disease ([place]) - Patient follows with Dr. Mortimer Fries for COPD.  PFTs in 2018 showed moderate obstructive airways disease with significant air trapping and hyperinflation. He is not on maintenance medication. I do not have high clinical suspicion his current dyspnea symptoms are related to underlying pulmonary disease. He has no associated respiratory symptoms.  Recommend therapeutic trial scheduled ipratropium-albuterol nebulizer twice a day for 1 week. If no benefit he can stop.    40 mins spent on case; >50% face to face with patient   Martyn Ehrich, NP 12/31/2020

## 2020-12-31 NOTE — Assessment & Plan Note (Signed)
-   Suspect this is related to his bradycardia. Orthostatic BP was negative but he was symptomatic. EKG showed bradycardia while in the ED. HR today 50. He is not on any antihypertensive medication. He will be seeing cardiology next week, may need additional testing including holter monitor. If dizziness not cardiac related he will need to follow-up with neurology.

## 2021-01-03 ENCOUNTER — Emergency Department: Payer: PPO

## 2021-01-03 ENCOUNTER — Other Ambulatory Visit: Payer: Self-pay

## 2021-01-03 ENCOUNTER — Telehealth: Payer: Self-pay | Admitting: Cardiovascular Disease

## 2021-01-03 ENCOUNTER — Emergency Department
Admission: EM | Admit: 2021-01-03 | Discharge: 2021-01-03 | Disposition: A | Discharge status: Home / Self Care | Payer: PPO | Attending: Emergency Medicine | Admitting: Emergency Medicine

## 2021-01-03 DIAGNOSIS — Z8679 Personal history of other diseases of the circulatory system: Secondary | ICD-10-CM | POA: Insufficient documentation

## 2021-01-03 DIAGNOSIS — E039 Hypothyroidism, unspecified: Secondary | ICD-10-CM | POA: Insufficient documentation

## 2021-01-03 DIAGNOSIS — R0602 Shortness of breath: Secondary | ICD-10-CM | POA: Insufficient documentation

## 2021-01-03 DIAGNOSIS — N183 Chronic kidney disease, stage 3 unspecified: Secondary | ICD-10-CM | POA: Insufficient documentation

## 2021-01-03 DIAGNOSIS — Z85828 Personal history of other malignant neoplasm of skin: Secondary | ICD-10-CM | POA: Insufficient documentation

## 2021-01-03 DIAGNOSIS — Z20822 Contact with and (suspected) exposure to covid-19: Secondary | ICD-10-CM | POA: Insufficient documentation

## 2021-01-03 DIAGNOSIS — I2510 Atherosclerotic heart disease of native coronary artery without angina pectoris: Secondary | ICD-10-CM | POA: Diagnosis not present

## 2021-01-03 DIAGNOSIS — J449 Chronic obstructive pulmonary disease, unspecified: Secondary | ICD-10-CM | POA: Diagnosis not present

## 2021-01-03 LAB — COMPREHENSIVE METABOLIC PANEL
ALT: 17 U/L (ref 0–44)
AST: 23 U/L (ref 15–41)
Albumin: 3.9 g/dL (ref 3.5–5.0)
Alkaline Phosphatase: 33 U/L — ABNORMAL LOW (ref 38–126)
Anion gap: 8 (ref 5–15)
BUN: 23 mg/dL (ref 8–23)
CO2: 26 mmol/L (ref 22–32)
Calcium: 8.9 mg/dL (ref 8.9–10.3)
Chloride: 102 mmol/L (ref 98–111)
Creatinine, Ser: 1.35 mg/dL — ABNORMAL HIGH (ref 0.61–1.24)
GFR, Estimated: 50 mL/min — ABNORMAL LOW (ref >60)
Glucose, Bld: 92 mg/dL (ref 70–99)
Potassium: 4.3 mmol/L (ref 3.5–5.1)
Sodium: 136 mmol/L (ref 135–145)
Total Bilirubin: 1.7 mg/dL — ABNORMAL HIGH (ref 0.3–1.2)
Total Protein: 6.4 g/dL — ABNORMAL LOW (ref 6.5–8.1)

## 2021-01-03 LAB — CBC WITH DIFFERENTIAL/PLATELET
Abs Immature Granulocytes: 0.01 10*3/uL (ref 0.00–0.07)
Basophils Absolute: 0 10*3/uL (ref 0.0–0.1)
Basophils Relative: 1 %
Eosinophils Absolute: 0.1 10*3/uL (ref 0.0–0.5)
Eosinophils Relative: 2 %
HCT: 43.7 % (ref 39.0–52.0)
Hemoglobin: 15.2 g/dL (ref 13.0–17.0)
Immature Granulocytes: 0 %
Lymphocytes Relative: 19 %
Lymphs Abs: 0.9 10*3/uL (ref 0.7–4.0)
MCH: 33.6 pg (ref 26.0–34.0)
MCHC: 34.8 g/dL (ref 30.0–36.0)
MCV: 96.7 fL (ref 80.0–100.0)
Monocytes Absolute: 0.4 10*3/uL (ref 0.1–1.0)
Monocytes Relative: 8 %
Neutro Abs: 3.5 10*3/uL (ref 1.7–7.7)
Neutrophils Relative %: 70 %
Platelets: 206 10*3/uL (ref 150–400)
RBC: 4.52 MIL/uL (ref 4.22–5.81)
RDW: 12.9 % (ref 11.5–15.5)
WBC: 5 10*3/uL (ref 4.0–10.5)
nRBC: 0 % (ref 0.0–0.2)

## 2021-01-03 LAB — RESP PANEL BY RT-PCR (FLU A&B, COVID) ARPGX2
Influenza A by PCR: NEGATIVE
Influenza B by PCR: NEGATIVE
SARS Coronavirus 2 by RT PCR: NEGATIVE

## 2021-01-03 LAB — BRAIN NATRIURETIC PEPTIDE: B Natriuretic Peptide: 126.7 pg/mL — ABNORMAL HIGH (ref 0.0–100.0)

## 2021-01-03 LAB — TROPONIN I (HIGH SENSITIVITY): Troponin I (High Sensitivity): 10 ng/L (ref <18)

## 2021-01-03 LAB — LACTIC ACID, PLASMA: Lactic Acid, Venous: 1.7 mmol/L (ref 0.5–1.9)

## 2021-01-03 NOTE — ED Notes (Signed)
Patient stable and discharged with all personal belongings and AVS. AVS and discharge instructions reviewed with patient and opportunity for questions provided.   

## 2021-01-03 NOTE — ED Triage Notes (Signed)
Pt here with SOB since last week. Pt was here to be evaluated but left early die to the wait time. Pt states that he has seen pulmonary but still does not know what is causing the SOB. Pt in NAD in triage.

## 2021-01-03 NOTE — Telephone Encounter (Signed)
GFR was calculated into the CM[facility] lab a PSA was in the chart to be collected but was not collected today.

## 2021-01-03 NOTE — ED Provider Notes (Signed)
Emergency Medicine Provider Triage Evaluation Note  Theodore [name] , a 87 y.o. male  was evaluated in triage.  Pt complains of ongoing shortness of breath.  Patient presented to the ED last week, left prior to being seen due to the wait time.  Patient been evaluated in triage by a provider, orders in place but the patient had not been roomed prior to leaving.  Patient presents with ongoing shortness of breath.  He was seen by pulmonology who did not believe that his episodic shortness of breath was lung related.  Pulmonology felt that it was likely secondary to symptomatic bradycardia.  Patient states he will have periods where he feels very short of breath, and periods where he is not short of breath.  While talking, patient was notably short of breath, having to shorten his sentences to be able to talk in complete sentence without taking a breath.  No fevers or chills reported, congestion, sore throat, cough or abdominal complaints.  No chest pain..  Review of Systems  Positive: Shortness of breath Negative: Chest pain, cough, URI symptoms, fevers or chills, abdominal complaints  Physical Exam  BP 113/87 (BP Location: Left Arm)   Pulse 72   Temp (!) 97.5 F (36.4 C) (Oral)   Resp 18   Ht 6\' 3"  (1.905 m)   Wt 78 kg   SpO2 100%   BMI 21.49 kg/m  Gen:   Awake, no distress   Resp:  Normal effort.  When talking, patient becomes visibly short of breath.  He shortens his sentences to build to complete a complete sentence without taking a breath.  Mild crackles on auscultation. MSK:   Moves extremities without difficulty  Other:  Auscultation of the heart reveals regular rate, rhythm, no murmurs, rubs, gallops.  Medical Decision Making  Medically screening exam initiated at 1:27 PM.  Appropriate orders placed.  [name] was informed that the remainder of the evaluation will be completed by another provider, this initial triage assessment does not replace that evaluation, and the importance of  remaining in the ED until their evaluation is complete.  Patient presents with ongoing shortness of breath.  He has been seen by pulmonology, referred to cardiology but has not seen cardiology at this time.  He was in our department 4 days ago for similar symptoms, left due to the wait time.  No significant abnormalities on ED work-up explain symptoms, however he continues to have shortness of breath and is visibly short of breath while talking with me.  Lung sounds revealed some mild crackles but no wheezing.  Patient will have labs, EKG, chest x-ray   Brynda Peon 01/03/21 1333    Lavonia Drafts, MD 01/03/21 1342

## 2021-01-03 NOTE — ED Provider Notes (Signed)
[facility] Emergency Department Provider Note  Time seen: 5:09 PM  I have reviewed the triage vital signs and the nursing notes.   HISTORY  Chief Complaint Shortness of Breath   HPI Theodore [name] is a 87 y.o. male with a past medical history of essential tremor, COPD, CAD, presents to the emergency department for shortness of breath.  According to the patient for the past month or so he has been experiencing intermittent episodes of shortness of breath.  Patient states he has seen his pulmonologist for the same and states he had a reassuring work-up by pulmonology and was referred to cardiology as he notes that his heart rate drops into the 50s at times.  Patient has not yet been able to see cardiology.  Patient denies any chest pain at any point.  Denies any fever or cough.  Denies any leg pain or swelling but does state generalized weakness at times.   Past Medical History:  Diagnosis Date   Arthritis    knees   Asymptomatic Sinus bradycardia    a. 05/2016 Zio: Avg HR 61 (41-167).   Benign essential tremor    Cancer ([place]) 2002   melanoma right ear   Colon polyp    COPD (chronic obstructive pulmonary disease) ([place])    pt said he believes it is a misdiagnosis    Coronary artery calcification seen on CT scan    a. 06/2016 CTA Chest: cor Ca2+; b. 05/2017 MV: EF >65%. No ischemia/infarct.   Depression    History of echocardiogram    a. 04/2016 Echo: EF 60-65%, mild conc LVH. Nl PASP.   History of kidney stones    Hx of dysplastic nevus 05/07/2017   L anterior shoulder, severe   Hx of dysplastic nevus 09/06/2020   R lower sternum, moderate atypia   Hypothyroidism    Hypothyroidism    Melanoma ([place]) 1990's per pt   R ear   Melanoma in situ ([place]) 01/12/2014   left jaw   Palpitations    a. 05/2016 Zio: Avg HR 61 (41-167). 9 SVT runs (fastest 167 - 5 beats; longest 8 beats - 101 bpm). Rare PACs/PVCs.   PVC's (premature ventricular contractions)    Rhinitis     Wears dentures    partial upper and lower   Wears hearing aid in both ears     Patient Active Problem List   Diagnosis Date Noted   Dizziness 12/31/2020   Anxiety in acute stress reaction 12/07/2020   CKD (chronic kidney disease) stage 3, GFR 30-59 ml/min ([place]) 12/01/2020   Mild cognitive impairment with memory loss 11/30/2020   Elevated PSA, between 10 and less than 20 ng/ml 11/30/2020   COVID-19 virus infection 04/06/2020   Acquired trigger finger of left middle finger 08/07/2019   Carpal tunnel syndrome of right wrist 07/10/2019   Pain in right hand 06/05/2019   Ulnar neuropathy 06/05/2019   Essential tremor 05/28/2019   Disorder of bursae of shoulder region 01/29/2019   Localized, primary osteoarthritis of shoulder region 01/29/2019   Localized, secondary osteoarthritis of the shoulder region 01/29/2019   Osteoarthritis of knee 01/29/2019   Shoulder joint pain 01/29/2019   Chronic obstructive pulmonary disease ([place]) 09/24/2018   Grief 09/02/2018   Cervical radiculopathy 08/14/2018   Neck pain 08/12/2018   Sinus bradycardia 03/21/2018   DOE (dyspnea on exertion) 03/21/2018   PVC (premature ventricular contraction) 03/21/2018   Weakness of both lower extremities 08/09/2017   Dysphagia 09/05/2016  Ringworm, body 06/27/2016   Bradycardia 05/10/2016   Coronary artery disease involving native coronary artery of native heart with angina pectoris ([place])    PAD (peripheral artery disease) ([place])    Asthma 05/09/2016   Anterior knee pain, left 12/26/2015   Hypothyroidism 07/04/2014   Hyperlipidemia LDL goal <100 07/04/2014   Nonallergic vasomotor rhinitis 07/01/2014   Long-term use of high-risk medication 01/16/2014   Hypertrophy of prostate with urinary obstruction and other lower urinary tract symptoms (LUTS) 08/08/2013   Hypogonadism male 08/08/2013   Vitamin D deficiency 07/31/2013   Depression with anxiety 04/28/2013   Low serum testosterone level 04/28/2013   Actinic  keratosis 05/10/2010   History of malignant melanoma of skin 05/10/2010   Inflamed seborrheic keratosis 05/10/2010    Past Surgical History:  Procedure Laterality Date   ADENOIDECTOMY     CATARACT EXTRACTION W/PHACO Left 10/22/2017   Procedure: CATARACT EXTRACTION PHACO AND INTRAOCULAR LENS PLACEMENT ([place])  LEFT;  Surgeon: Eulogio Bear, MD;  Location: [place];  Service: Ophthalmology;  Laterality: Left;   CATARACT EXTRACTION W/PHACO Right 11/20/2017   Procedure: CATARACT EXTRACTION PHACO AND INTRAOCULAR LENS PLACEMENT ([facility]) RIGHT;  Surgeon: Eulogio Bear, MD;  Location: [place];  Service: Ophthalmology;  Laterality: Right;   ESOPHAGOGASTRODUODENOSCOPY (EGD) WITH PROPOFOL N/A 10/31/2016   Procedure: ESOPHAGOGASTRODUODENOSCOPY (EGD) WITH PROPOFOL;  Surgeon: Lollie Sails, MD;  Location[facility];  Service: Endoscopy;  Laterality: N/A;   JOINT REPLACEMENT Right 2003   knee   knee meniscus repair Right    MELANOMA EXCISION Right    ear. Followed by Dr. Nehemiah Massed   PATELLECTOMY Bilateral    TONSILLECTOMY      Prior to Admission medications   Medication Sig Start Date End Date Taking? Authorizing Provider  Alpha-Lipoic Acid 600 MG CAPS Take by mouth.    [provider]  ascorbic Acid (VITAMIN C) 500 MG CPCR Take by mouth.    [provider]  b complex vitamins tablet Take by mouth.    [provider]  buPROPion (WELLBUTRIN XL) 150 MG 24 hr tablet TAKE 1 TABLET BY MOUTH EVERY DAY 11/18/20   [name], MD  cetirizine (ZYRTEC ALLERGY) 10 MG tablet Take 1 tablet (10 mg total) by mouth daily. 12/16/20   Flora Lipps, MD  Cholecalciferol 125 MCG (5000 UT) TABS Take by mouth.    [provider]  COD LIVER OIL PO Take by mouth.    [provider]  Coenzyme Q10 100 MG capsule Take by mouth.    [provider]  cycloSPORINE (RESTASIS) 0.05 % ophthalmic emulsion Restasis 0.05 % eye drops in a  dropperette  INSTILL 1 DROP INTO BOTH EYES TWICE A DAY    [provider]  donepezil (ARICEPT) 5 MG tablet Take by mouth. 07/03/19   [provider]  ezetimibe (ZETIA) 10 MG tablet Take 1 tablet (10 mg total) by mouth daily. 08/25/20   Minna Merritts, MD  fluorometholone (FML) 0.1 % ophthalmic suspension Place 1 drop into the right eye 2 (two) times daily. 12/17/20   [provider]  fluticasone (FLONASE) 50 MCG/ACT nasal spray Place 2 sprays into both nostrils daily. 12/16/20 12/16/21  Flora Lipps, MD  hydrOXYzine (VISTARIL) 25 MG capsule Take 1 capsule (25 mg total) by mouth every 8 (eight) hours as needed for anxiety. 12/07/20   [name], MD  ipratropium-albuterol (DUONEB) 0.5-2.5 (3) MG/3ML SOLN Take 3 mLs by nebulization every 6 (six) hours as  needed. 04/06/20   [name], MD  levothyroxine (SYNTHROID) 100 MCG tablet TAKE 1 TABLET BY MOUTH EVERY DAY BEFORE BREAKFAST 12/28/20   [name], MD  magnesium oxide (MAG-OX) 400 MG tablet Take 400 mg by mouth daily.    [provider]  OVER THE COUNTER MEDICATION Take 4 tablets by mouth daily. Standard Process- Cardio Plus    [provider]  OVER THE COUNTER MEDICATION Take 1 tablet by mouth daily. Cardio Platinum - Vitamin K-2 500 mg, Arginine 300 mg, Natokinase 100 mg    [provider]  OVER THE COUNTER MEDICATION Take by mouth daily. Hemp oil    [provider]  Probiotic Product (PROBIOTIC + TURMERIC EXTRACT) 400 MG CAPS Take by mouth.    [provider]  Probiotic Product (PROBIOTIC ACIDOPHILUS) CAPS Take 1 capsule by mouth daily.    [provider]  sertraline (ZOLOFT) 50 MG tablet TAKE 1 TABLET (50 MG TOTAL) BY MOUTH DAILY. WITH A MEAL. 12/29/20   [name], MD  testosterone cypionate (DEPOTESTOSTERONE CYPIONATE) 200 MG/ML injection INJECT 0.5 ML (100 MG TOTAL) INTO THE MUSCLE EVERY 14 DAYS. 10/06/20   [name], MD  Turmeric 400 MG CAPS  Take by mouth.    [provider]    Allergies  Allergen Reactions   Penicillins Rash and Other (See Comments)    Other reaction(s): Other (see comments) Other reaction(s): UNKNOWN   Lexapro [Escitalopram] Other (See Comments)    Increased hand tremor    Family History  Problem Relation Age of Onset   Hypertension Mother    Heart disease Mother        CHF   Heart disease Father    Cancer Sister        melanoma    Social History Social History   Tobacco Use   Smoking status: Never   Smokeless tobacco: Never  Vaping Use   Vaping Use: Never used  Substance Use Topics   Alcohol use: Yes    Comment: 1 glass of wine daily   Drug use: No    Review of Systems Constitutional: Negative for fever. Cardiovascular: Negative for chest pain. Respiratory: Negative for shortness of breath. Gastrointestinal: Negative for abdominal pain, vomiting and diarrhea. Genitourinary: Negative for urinary compaints Musculoskeletal: Negative for musculoskeletal complaints Skin: Negative for skin complaints  Neurological: Negative for headache All other ROS negative  ____________________________________________   PHYSICAL EXAM:  VITAL SIGNS: ED Triage Vitals [01/03/21 1325]  Enc Vitals Group     BP 113/87     Pulse Rate 72     Resp 18     Temp (!) 97.5 F (36.4 C)     Temp Source Oral     SpO2 100 %     Weight 171 lb 15.3 oz (78 kg)     Height 6\' 3"  (1.905 m)     Head Circumference      Peak Flow      Pain Score 0     Pain Loc      Pain Edu?      Excl. in [place]?    Constitutional: Alert and oriented. Well appearing and in no distress. Eyes: Normal exam ENT      Head: Normocephalic and atraumatic.      Mouth/Throat: Mucous membranes are moist. Cardiovascular: Normal rate, regular rhythm.  Respiratory: Normal respiratory effort without tachypnea nor retractions. Breath sounds are clear  Gastrointestinal: Soft and nontender. No distention.   Musculoskeletal:  Nontender with normal range of motion in all extremities. No lower extremity tenderness or edema. Neurologic:  Normal speech and language. No gross focal neurologic deficits are appreciated. Skin:  Skin is warm, dry and intact.  Psychiatric: Mood and affect are normal.   ____________________________________________    EKG  EKG viewed and interpreted by myself shows a normal sinus rhythm at 70 bpm with a narrow QRS, normal axis, normal intervals, no concerning ST changes.  ____________________________________________    RADIOLOGY  Chest x-ray shows no acute abnormality.  ____________________________________________   INITIAL IMPRESSION / ASSESSMENT AND PLAN / ED COURSE  Pertinent labs & imaging results that were available during my care of the patient were reviewed by me and considered in my medical decision making (see chart for details).   Patient presents emergency department for 1 month of intermittent shortness of breath.  Overall the patient appears well, no distress.  Clear lung sounds on examination.  Reassuring physical exam, no lower extremity edema.  Very slight tachypnea around 20 to 22 breaths/min during my examination.  100% room air saturation throughout exam.  Heart rate remains between 55 and 70 bpm.  Patient's labs are largely within normal limits as well besides a very mild BNP elevation of 126.  We will add on a troponin as well as a COVID swab.  If the patient's work-up is normal anticipate likely discharge home with cardiology follow-up which is scheduled in approximately 1 week.  Patient's work-up is essentially reassuring.  COVID is negative.  I have sent a note to his cardiologist to see if we can expedite his work-up.  Given the patient's reassuring work-up in the emergency department I believe the patient is safe for discharge home.  HENCE DERRICK was evaluated in Emergency Department on 01/03/2021 for the symptoms described in the history of present illness.  He was evaluated in the context of the global COVID-19 pandemic, which necessitated consideration that the patient might be at risk for infection with the SARS-CoV-2 virus that causes COVID-19. Institutional protocols and algorithms that pertain to the evaluation of patients at risk for COVID-19 are in a state of rapid change based on information released by regulatory bodies including the CDC and federal and state organizations. These policies and algorithms were followed during the patient's care in the ED.  ____________________________________________   FINAL CLINICAL IMPRESSION(S) / ED DIAGNOSES  Shortness of breath   Harvest Dark, MD 01/03/21 [phone]

## 2021-01-03 NOTE — Telephone Encounter (Signed)
Able to reach back out to Theodore [name] regarding on-going SOB, weakness, and dizziness. Pt reports this has been going on for a while Recently given Zpack and prednisone 20mg  x 10 days for COPD exacerbation. Theodore [name] went to the ED on 12/28/20 c/o SOB, left after a 10 hr wait. CXR 10/18 showed no acute abnormality. Had two EKG that were "normal"  Pt seen pulmonologist in [place], Volanda Napoleon, NP who advised  "do not have high clinical suspicion his current dyspnea symptoms are related to underlying pulmonary disease. He has no associated respiratory symptoms. Recommend therapeutic trial scheduled ipratropium-albuterol nebulizer twice a day for 1 week. If no benefit he can stop."  Pt reports his SOB has gotten worse since being seen on Friday, and still experiencing dizziness and leg weakness. Using his albuterol-nebulizer which is not improving his symptoms per pt. Theodore [name] reports a lot of post nasal drip, using Flonase nasal spray as directed but stopped Zyrtec. Denies chest pain, chest tightness or wheezing.   Theodore [name] is VERY winded on the phone, SOB, and having trouble communicating d/t SOB, having to stop to cath his breath, pt talking in a low/raspy voice d/t symptoms. Pt able to find his home pulse ox, 85-86 % on RA. HR 59.  Advised base on his worsening symptoms he could call the pulmonary office in [place], but they may advise on ED d/t current and worsening symptoms. This RN advised on ED visit, explained with low oxygen levels in the 80s, they should act on intervention and not have him sit for several hours in [place].  Urgent care will direct him to the ED based on his current symptoms as well.   Pt verbalized understanding, is reluctant to seek ED d/t past experience of "10 hr wait", but advised his symptoms are worse and low oxygen levels. Pt agrees and will seek ED, advised to have wife drive or call EMS.  Pt has appt with Dr. Rockey Situ 11/2 at 09:40 am for f/u visit d/t SOB.

## 2021-01-03 NOTE — Telephone Encounter (Signed)
Pt c/o Shortness Of Breath: STAT if SOB developed within the last 24 hours or pt is noticeably SOB on the phone  1. Are you currently SOB (can you hear that pt is SOB on the phone)?  Yes,   2. How long have you been experiencing SOB?  Since last Tuesday. He states he went to the Emergency Department and wasn't able to wait the length of time to see someone  3. Are you SOB when sitting or when up moving around? Both, mainly when he gets up  4. Are you currently experiencing any other symptoms? Weakness, shaky, legs are very weak.

## 2021-01-03 NOTE — Telephone Encounter (Signed)
Called to notify patient no answer left message to call office.

## 2021-01-04 NOTE — Progress Notes (Signed)
Cardiology Office Note    Date:  01/05/2021   ID:  Theodore [name], DOB September 02, 1929, MRN [id number]  PCP:  [name], MD  Cardiologist:  Ida Rogue, MD  Electrophysiologist:  None   Chief Complaint: Shortness of breath  History of Present Illness:   Theodore [name] is a 87 y.o. male with history of CAD noted on CT imaging, asymptomatic bradycardia, PAD, PVCs, COPD, COVID in 03/2020, hypothyroidism, and depression who presents for evaluation of shortness of breath, fatigue, and presyncope.  Prior CT chest showed three-vessel coronary artery calcification with subsequent stress testing in 05/2016 showing no evidence of ischemia or infarct.  Echo in 04/2016 showed an EF of 60% with mild to moderate LVH, and no significant valvular abnormalities.  Outpatient cardiac monitoring in 05/2016 showed a predominant rhythm of sinus with an average rate of 61 (range 41 to 167 bpm), 8 runs of SVT occurred with the fastest interval lasting 5 beats with a maximum rate of 167 bpm and the longest interval lasting 8 beats.  Rare PACs, atrial couplets, atrial triplets, PVCs, and ventricular couplets were noted.  PFTs in 06/2016 demonstrated moderate obstructive airway disease.  CPX in 06/2017 demonstrated no evidence of cardiopulmonary limitations with concern for possible hyperventilation.  PVCs were noted on EKG which improved with exercise.  In 07/2019, he underwent MRI guided focused ultrasound thalamotomy on the left for treatment of essential tremor of the right arm.  He was told following the procedure he may experience some weakness on the right side.  He was evaluated in 08/2019 noting fatigue and exertional dyspnea.  Given this, he underwent echo in 09/2019 which showed an EF of 55 to 60%, no regional wall motion abnormalities, normal LV diastolic function parameters, normal RV systolic function and ventricular cavity size, no significant valvular abnormalities, and an estimated right atrial pressure of 8  mmHg.  He was last seen in the office in 08/2020 and noted some intermittent palpitations.  No further testing was undertaken.  He has been seen by pulmonology recently and treated for COPD exacerbation with atypical pneumonia with antibiotics, prednisone, and inhalers.  He was noted to have intermittent chest discomfort and was advised to follow-up with our office.  He initially presented to the ED on 12/28/2020, though left without being seen due to the wait.  He was later seen at a local urgent care that same day with dizziness and ongoing shortness of breath with associated near syncope after walking up a flight of stairs.  BNP 91.  Chest x-ray without acute cardiopulmonary abnormality.  EKG showed sinus bradycardia with sinus arrhythmia with no acute ST-T changes.  He was subsequently transferred to the ED and left without being seen after waiting for approximately 10 hours.  He followed up with pulmonology on 12/31/2020 with symptoms felt less likely to be pulmonary in etiology.  He underwent a trial of scheduled DuoNeb.  He was most recently seen in the ED on 01/03/2021 with ongoing intermittent episodes of shortness of breath.  High sensitivity troponin negative, BNP 126, chest x-ray without evidence of acute cardiopulmonary abnormality with noted scarring within the left lung base and aortic atherosclerosis.  EKG showed NSR, 70 bpm, and no acute ST-T changes.  He was advised to follow-up with cardiology as an outpatient.  Over the past approximate 6 weeks he has noted intermittent episodes of profound fatigue, shortness of breath, and an episode of presyncope after ambulating up a flight of stairs.  Episodes will  last for several hours to all day in duration.  With regards to his all day episodes, typically when he wakes up the next day he is back to baseline.  No frank angina or syncope.  No lower extremity swelling, abdominal distention, or early satiety.  Over the past 15 months he has lost  approximately 20 pounds.  He attributes this to being the primary caretaker for his wife.   Labs independently reviewed: 12/2020 - HGB 15.2, PLT 206, potassium 4.3, BUN 23, SCr 1.35, albumin 3.9, AST/ALT normal 09/2020 - TC 197, TG 70, HDL 62, LDL 121 09/2019 - TSH normal  Past Medical History:  Diagnosis Date   Arthritis    knees   Asymptomatic Sinus bradycardia    a. 05/2016 Zio: Avg HR 61 (41-167).   Benign essential tremor    Cancer ([place]) 2002   melanoma right ear   Colon polyp    COPD (chronic obstructive pulmonary disease) ([place])    pt said he believes it is a misdiagnosis    Coronary artery calcification seen on CT scan    a. 06/2016 CTA Chest: cor Ca2+; b. 05/2017 MV: EF >65%. No ischemia/infarct.   Depression    History of echocardiogram    a. 04/2016 Echo: EF 60-65%, mild conc LVH. Nl PASP.   History of kidney stones    Hx of dysplastic nevus 05/07/2017   L anterior shoulder, severe   Hx of dysplastic nevus 09/06/2020   R lower sternum, moderate atypia   Hypothyroidism    Hypothyroidism    Melanoma ([place]) 1990's per pt   R ear   Melanoma in situ ([place]) 01/12/2014   left jaw   Palpitations    a. 05/2016 Zio: Avg HR 61 (41-167). 9 SVT runs (fastest 167 - 5 beats; longest 8 beats - 101 bpm). Rare PACs/PVCs.   PVC's (premature ventricular contractions)    Rhinitis    Wears dentures    partial upper and lower   Wears hearing aid in both ears     Past Surgical History:  Procedure Laterality Date   ADENOIDECTOMY     CATARACT EXTRACTION W/PHACO Left 10/22/2017   Procedure: CATARACT EXTRACTION PHACO AND INTRAOCULAR LENS PLACEMENT (IOC)  LEFT;  Surgeon: Eulogio Bear, MD;  Location: [facility];  Service: Ophthalmology;  Laterality: Left;   CATARACT EXTRACTION W/PHACO Right 11/20/2017   Procedure: CATARACT EXTRACTION PHACO AND INTRAOCULAR LENS PLACEMENT (IOC) RIGHT;  Surgeon: Eulogio Bear, MD;  Location: [facility];  Service: Ophthalmology;   Laterality: Right;   ESOPHAGOGASTRODUODENOSCOPY (EGD) WITH PROPOFOL N/A 10/31/2016   Procedure: ESOPHAGOGASTRODUODENOSCOPY (EGD) WITH PROPOFOL;  Surgeon: Lollie Sails, MD;  Location: [facility] ENDOSCOPY;  Service: Endoscopy;  Laterality: N/A;   JOINT REPLACEMENT Right 2003   knee   knee meniscus repair Right    MELANOMA EXCISION Right    ear. Followed by Dr. Nehemiah Massed   PATELLECTOMY Bilateral    TONSILLECTOMY      Current Medications: Current Meds  Medication Sig   Alpha-Lipoic Acid 600 MG CAPS Take by mouth.   ascorbic Acid (VITAMIN C) 500 MG CPCR Take by mouth.   b complex vitamins tablet Take by mouth.   buPROPion (WELLBUTRIN XL) 150 MG 24 hr tablet TAKE 1 TABLET BY MOUTH EVERY DAY   Cholecalciferol 125 MCG (5000 UT) TABS Take by mouth.   COD LIVER OIL PO Take by mouth.   Coenzyme Q10 100 MG capsule Take by mouth.   cycloSPORINE (RESTASIS) 0.05 %  ophthalmic emulsion Restasis 0.05 % eye drops in a dropperette  INSTILL 1 DROP INTO BOTH EYES TWICE A DAY   donepezil (ARICEPT) 5 MG tablet Take by mouth.   ezetimibe (ZETIA) 10 MG tablet Take 1 tablet (10 mg total) by mouth daily.   fluorometholone (FML) 0.1 % ophthalmic suspension Place 1 drop into the right eye 2 (two) times daily.   ipratropium-albuterol (DUONEB) 0.5-2.5 (3) MG/3ML SOLN Take 3 mLs by nebulization every 6 (six) hours as needed.   levothyroxine (SYNTHROID) 100 MCG tablet TAKE 1 TABLET BY MOUTH EVERY DAY BEFORE BREAKFAST   magnesium oxide (MAG-OX) 400 MG tablet Take 400 mg by mouth daily.   OVER THE COUNTER MEDICATION Take 4 tablets by mouth daily. Standard Process- Cardio Plus   OVER THE COUNTER MEDICATION Take 1 tablet by mouth daily. Cardio Platinum - Vitamin K-2 500 mg, Arginine 300 mg, Natokinase 100 mg   OVER THE COUNTER MEDICATION Take by mouth daily. Hemp oil   Probiotic Product (PROBIOTIC + TURMERIC EXTRACT) 400 MG CAPS Take by mouth.   Probiotic Product (PROBIOTIC ACIDOPHILUS) CAPS Take 1 capsule by mouth  daily.   sertraline (ZOLOFT) 50 MG tablet TAKE 1 TABLET (50 MG TOTAL) BY MOUTH DAILY. WITH A MEAL.   testosterone cypionate (DEPOTESTOSTERONE CYPIONATE) 200 MG/ML injection INJECT 0.5 ML (100 MG TOTAL) INTO THE MUSCLE EVERY 14 DAYS.   Turmeric 400 MG CAPS Take by mouth.    Allergies:   Penicillins and Lexapro [escitalopram]   Social History   Socioeconomic History   Marital status: Married    Spouse name: [name]   Number of children: 4   Years of education: 16   Highest education level: Not on file  Occupational History   Occupation: Retired    Comment: Architect -   Tobacco Use   Smoking status: Never   Smokeless tobacco: Never  Vaping Use   Vaping Use: Never used  Substance and Sexual Activity   Alcohol use: Yes    Comment: 1 glass of wine daily   Drug use: No   Sexual activity: Not Currently  Other Topics Concern   Not on file  Social History Narrative   Mr. Karge grew up in [place], [place]. 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 He and his wife are living at [facility]. He is very active at [facility]. He enjoys music.    Social Determinants of Health   Financial Resource Strain: Low Risk    Difficulty of Paying Living Expenses: Not hard at all  Food Insecurity: No Food Insecurity   Worried About Charity fundraiser in the Last Year: Never true   [place] in the Last Year: Never true  Transportation Needs: No Transportation Needs   Lack of Transportation (Medical): No   Lack of Transportation (Non-Medical): No  Physical Activity: Insufficiently Active   Days of Exercise per Week: 4 days   Minutes of Exercise per Session: 30 min  Stress: No Stress Concern Present   Feeling of Stress : Only a little  Social Connections: Engineer, maintenance of Communication with Friends and Family: More than three times a week   Frequency of Social Gatherings with Friends and Family: Once a week   Attends Religious Services: 1 to 4 times per year  Active Member of Clubs or Organizations: Yes   Attends Archivist Meetings: More than 4 times per year   Marital Status: Married     Family History:  The patient's family history includes Cancer in his sister; Heart disease in his father and mother; Hypertension in his mother.  ROS:   Review of Systems  Constitutional:  Positive for malaise/fatigue. Negative for chills, diaphoresis, fever and weight loss.  HENT:  Negative for congestion.   Eyes:  Negative for discharge and redness.  Respiratory:  Positive for shortness of breath. Negative for cough, sputum production and wheezing.   Cardiovascular:  Negative for chest pain, palpitations, orthopnea, claudication, leg swelling and PND.  Gastrointestinal:  Negative for abdominal pain, heartburn, nausea and vomiting.  Musculoskeletal:  Negative for falls and myalgias.  Skin:  Negative for rash.  Neurological:  Positive for dizziness and weakness. Negative for tingling, tremors, sensory change, speech change, focal weakness and loss of consciousness.       Presyncope  Endo/Heme/Allergies:  Does not bruise/bleed easily.  Psychiatric/Behavioral:  Negative for substance abuse. The patient is not nervous/anxious.   All other systems reviewed and are negative.   EKGs/Labs/Other Studies Reviewed:    Studies reviewed were summarized above. The additional studies were reviewed today:  2D echo 09/2019: 1. Left ventricular ejection fraction, by estimation, is 55 to 60%. The  left ventricle has normal function. The left ventricle has no regional  wall motion abnormalities. Left ventricular diastolic parameters were  normal.   2. Right ventricular systolic function is normal. The right ventricular  size is normal.   3.  The mitral valve is grossly normal. No evidence of mitral valve  regurgitation.   4. The aortic valve is tricuspid. Aortic valve regurgitation is not  visualized.   5. The inferior vena cava is dilated in size with >50% respiratory  variability, suggesting right atrial pressure of 8 mmHg.  __________  CPX 06/2017: Conclusion: Exercise testing with gas exchange demonstrates excellent functional capacity when compared to matched sedentary norms. There are no evidence of cardiopulmonary limitations. At peak exercise, patient approached ventilatory limits and is likely hyperventilating and shown with elevated VE/VCO2 slope. Please correlate clinically.   There were no ECG changes other than frequent PVCs which improved with exercise. O2 pulse noted with slightly early plateau which is a non specific finding as there were no other cardiac issues. Heart rate response was adequate.  __________  Elwyn Reach patch 05/2016: Normal sinus rhythm   Min HR of 41 bpm, max HR of 167 bpm, and avg HR of 61 bpm.    9 Supraventricular Tachycardia runs occurred, the run with the fastest interval lasting 5 beats with a max rate of 167 bpm, the longest lasting 8 beats with an avg rate of 101 bpm.   Isolated SVEs were rare (<1.0%), SVE Couplets were rare (<1.0%), and SVE Triplets were rare (<1.0%). Isolated VEs were occasional (2.0%, 23862), VE Couplets were rare (<1.0%, 410), and no VE Triplets were present.  __________  Carlton Adam MPI 05/2016: Low risk study without evidence of ischemia or scar. The left ventricular ejection fraction is hyperdynamic (>65%). Sensitivity and specificity of the study are degraded by extracardiac activity. __________  2D echo 04/2016: - Left ventricle: The cavity size was normal. Wall thickness was    increased increased in a pattern of mild to moderate LVH. There    was mild concentric hypertrophy. Systolic function was normal.    The estimated ejection fraction was in  the range of 60%  to 65%.    Wall motion was normal; there were no regional wall motion    abnormalities. Left ventricular diastolic function parameters    were normal.  - Left atrium: The atrium was normal in size.  - Right ventricle: Systolic function was normal.  - Pulmonary arteries: Systolic pressure was within the normal    range.     EKG:  EKG is ordered today.  The EKG ordered today demonstrates sinus bradycardia, 56 bpm, PACs in a pattern of bigeminy, no acute ST-T changes  Recent Labs: 01/03/2021: ALT 17; B Natriuretic Peptide 126.7; BUN 23; Creatinine, Ser 1.35; Hemoglobin 15.2; Platelets 206; Potassium 4.3; Sodium 136  Recent Lipid Panel    Component Value Date/Time   CHOL 197 09/21/2020 0934   TRIG 70.0 09/21/2020 0934   HDL 62.00 09/21/2020 0934   CHOLHDL 3 09/21/2020 0934   VLDL 14.0 09/21/2020 0934   LDLCALC 121 (H) 09/21/2020 0934    PHYSICAL EXAM:    VS:  BP 118/60 (BP Location: Left Arm, Patient Position: Sitting, Cuff Size: Normal)   Pulse (!) 56   Ht 6\' 3"  (1.905 m)   Wt 168 lb (76.2 kg)   SpO2 99%   BMI 21.00 kg/m   BMI: Body mass index is 21 kg/m.  Physical Exam Vitals reviewed.  Constitutional:      Appearance: He is well-developed.  HENT:     Head: Normocephalic and atraumatic.  Eyes:     General:        Right eye: No discharge.        Left eye: No discharge.  Neck:     Vascular: No JVD.  Cardiovascular:     Rate and Rhythm: Regular rhythm. Bradycardia present.     Pulses:          Posterior tibial pulses are 2+ on the right side and 2+ on the left side.     Heart sounds: Normal heart sounds, S1 normal and S2 normal. Heart sounds not distant. No midsystolic click and no opening snap. No murmur heard.   No friction rub.  Pulmonary:     Effort: Pulmonary effort is normal. No respiratory distress.     Breath sounds: Normal breath sounds. No decreased breath sounds, wheezing or rales.  Chest:     Chest wall: No tenderness.  Abdominal:     General: There  is no distension.     Palpations: Abdomen is soft.     Tenderness: There is no abdominal tenderness.  Musculoskeletal:     Cervical back: Normal range of motion.     Right lower leg: No edema.     Left lower leg: No edema.  Skin:    General: Skin is warm and dry.     Nails: There is no clubbing.  Neurological:     Mental Status: He is alert and oriented to person, place, and time.  Psychiatric:        Speech: Speech normal.        Behavior: Behavior normal.        Thought Content: Thought content normal.        Judgment: Judgment normal.    Wt Readings from Last 3 Encounters:  01/05/21 168 lb (76.2 kg)  01/03/21 171 lb 15.3 oz (78 kg)  12/31/20 172 lb (78 kg)     ASSESSMENT & PLAN:   Intermittent exertional dyspnea: He has had issues with intermittent shortness of breath for some time now  with prior work-up as outlined above.  However, over the past 6 weeks this has become significantly worse with associated fatigue, weakness, and an episode of presyncope.  Place a Zio patch as outlined below.  Schedule echo to evaluate LV systolic function and wall motion.  Following echo, will need to consider ischemic evaluation given exertional symptoms.  Sinus bradycardia with frequent PACs: Long history of asymptomatic bradycardia with prior outpatient cardiac monitoring demonstrated an average heart rate of 61 bpm.  In the ED most recently he has demonstrated appropriate chronotropic competence.  In our office today, he demonstrated appropriate chronotropic competence with an initial starting heart rate of 47 bpm with a pulse ox of 98% on room air, following ambulation his heart rate increased to 65 bpm with a pulse ox that remained stable at 98% on room air.  He did note fatigue with ambulation.  Place Zio patch.  Not on any AV nodal blocking medications.  Prior TSH has been normal.  CAD: Multivessel coronary artery calcification noted on prior CT imaging.  We will plan for ischemic testing  following echo results to assist with modality.  Continue current therapy.  Has previously declined statin therapy.  PVCs: Quiescent.  Place Zio patch as outlined above.  COPD: No acute exacerbation.  Follow-up with pulmonology as directed.  PAD: ABIs from 03/2020 demonstrated mild PAD in the bilateral lower extremities.  Followed by vascular surgery.  This was not discussed in detail at today's visit.  Disposition: F/u with Dr. Rockey Situ or an APP in approximately 6 weeks.   Medication Adjustments/Labs and Tests Ordered: Current medicines are reviewed at length with the patient today.  Concerns regarding medicines are outlined above. Medication changes, Labs and Tests ordered today are summarized above and listed in the Patient Instructions accessible in Encounters.   Signed, Christell Faith, PA-C 01/05/2021 4:23 PM     [facility] [address] [place], [place] [address] [phone]

## 2021-01-05 ENCOUNTER — Ambulatory Visit (INDEPENDENT_AMBULATORY_CARE_PROVIDER_SITE_OTHER): Payer: PPO

## 2021-01-05 ENCOUNTER — Other Ambulatory Visit: Payer: Self-pay

## 2021-01-05 ENCOUNTER — Encounter: Payer: Self-pay | Admitting: Physician Assistant

## 2021-01-05 ENCOUNTER — Ambulatory Visit: Payer: PPO | Admitting: Physician Assistant

## 2021-01-05 VITALS — BP 118/60 | HR 56 | Ht 75.0 in | Wt 168.0 lb

## 2021-01-05 DIAGNOSIS — I493 Ventricular premature depolarization: Secondary | ICD-10-CM | POA: Diagnosis not present

## 2021-01-05 DIAGNOSIS — I491 Atrial premature depolarization: Secondary | ICD-10-CM | POA: Diagnosis not present

## 2021-01-05 DIAGNOSIS — I2510 Atherosclerotic heart disease of native coronary artery without angina pectoris: Secondary | ICD-10-CM | POA: Diagnosis not present

## 2021-01-05 DIAGNOSIS — R0609 Other forms of dyspnea: Secondary | ICD-10-CM | POA: Diagnosis not present

## 2021-01-05 DIAGNOSIS — R001 Bradycardia, unspecified: Secondary | ICD-10-CM

## 2021-01-05 DIAGNOSIS — J449 Chronic obstructive pulmonary disease, unspecified: Secondary | ICD-10-CM | POA: Diagnosis not present

## 2021-01-05 NOTE — Telephone Encounter (Signed)
Spoke with pt's wife and let her know that he GFR was included in the cmp but that the PSA was not drawn. Explained to her that we could either schedule him an appt to come in or he can do it at his next appt on 01/17/2021. Wife stated that she would let pt know and have him call to let us know what he wants to do.

## 2021-01-05 NOTE — Patient Instructions (Signed)
Medication Instructions:  Your physician recommends that you continue on your current medications as directed. Please refer to the Current Medication list given to you today.  *If you need a refill on your cardiac medications before your next appointment, please call your pharmacy*   Lab Work: None ordered If you have labs (blood work) drawn today and your tests are completely normal, you will receive your results only by: [place] (if you have MyChart) OR A paper copy in the mail If you have any lab test that is abnormal or we need to change your treatment, we will call you to review the results.   Testing/Procedures:   Your physician has requested that you have an echocardiogram. Echocardiography is a painless test that uses sound waves to create images of your heart. It provides your doctor with information about the size and shape of your heart and how well your heart's chambers and valves are working. This procedure takes approximately one hour. There are no restrictions for this procedure.  Your physician has recommended that you wear a Zio XT monitor for 2 weeks.  This monitor is a medical device that records the heart's electrical activity. Doctors most often use these monitors to diagnose arrhythmias. Arrhythmias are problems with the speed or rhythm of the heartbeat. The monitor is a small device applied to your chest. You can wear one while you do your normal daily activities. While wearing this monitor if you have any symptoms to push the button and record what you felt. Once you have worn this monitor for the period of time provider prescribed (Usually 14 days), you will return the monitor device in the postage paid box. Once it is returned they will download the data collected and provide [place] with a report which the provider will then review and we will call you with those results. Important tips:  Avoid showering during the first 24 hours of wearing the monitor. Avoid  excessive sweating to help maximize wear time. Do not submerge the device, no hot tubs, and no swimming pools. Keep any lotions or oils away from the patch. After 24 hours you may shower with the patch on. Take brief showers with your back facing the shower head.  Do not remove patch once it has been placed because that will interrupt data and decrease adhesive wear time. Push the button when you have any symptoms and write down what you were feeling. Once you have completed wearing your monitor, remove and place into box which has postage paid and place in your outgoing mailbox.  If for some reason you have misplaced your box then call our office and we can provide another box and/or mail it off for you.    Follow-Up: At [facility], you and your health needs are our priority.  As part of our continuing mission to provide you with exceptional heart care, we have created designated Provider Care Teams.  These Care Teams include your primary Cardiologist (physician) and Advanced Practice Providers (APPs -  Physician Assistants and Nurse Practitioners) who all work together to provide you with the care you need, when you need it.  We recommend signing up for the patient portal called "MyChart".  Sign up information is provided on this After Visit Summary.  MyChart is used to connect with patients for Virtual Visits (Telemedicine).  Patients are able to view lab/test results, encounter notes, upcoming appointments, etc.  Non-urgent messages can be sent to your provider as well.   To learn  more about what you can do with MyChart, go to [email or web address].    Your next appointment:   6 week(s)  The format for your next appointment:   In Person  Provider:   You may see Ida Rogue, MD or one of the following Advanced Practice Providers on your designated Care Team:   Murray Hodgkins, NP Christell Faith, PA-C Marrianne Mood, PA-C Cadence Kathlen Mody, [place]   Other Instructions

## 2021-01-08 LAB — CULTURE, BLOOD (ROUTINE X 2)
Culture: NO GROWTH
Special Requests: ADEQUATE

## 2021-01-12 ENCOUNTER — Ambulatory Visit: Payer: PPO | Admitting: Cardiovascular Disease

## 2021-01-17 ENCOUNTER — Other Ambulatory Visit: Payer: Self-pay

## 2021-01-17 ENCOUNTER — Ambulatory Visit (INDEPENDENT_AMBULATORY_CARE_PROVIDER_SITE_OTHER): Payer: PPO | Admitting: Internal Medicine

## 2021-01-17 ENCOUNTER — Encounter: Payer: Self-pay | Admitting: Internal Medicine

## 2021-01-17 VITALS — BP 128/74 | HR 58 | Temp 96.3°F | Ht 75.0 in | Wt 168.6 lb

## 2021-01-17 DIAGNOSIS — R972 Elevated prostate specific antigen [PSA]: Secondary | ICD-10-CM

## 2021-01-17 DIAGNOSIS — Z79899 Other long term (current) drug therapy: Secondary | ICD-10-CM

## 2021-01-17 DIAGNOSIS — M6281 Muscle weakness (generalized): Secondary | ICD-10-CM

## 2021-01-17 DIAGNOSIS — R7301 Impaired fasting glucose: Secondary | ICD-10-CM | POA: Diagnosis not present

## 2021-01-17 DIAGNOSIS — E034 Atrophy of thyroid (acquired): Secondary | ICD-10-CM

## 2021-01-17 DIAGNOSIS — R7989 Other specified abnormal findings of blood chemistry: Secondary | ICD-10-CM | POA: Diagnosis not present

## 2021-01-17 DIAGNOSIS — I700 Atherosclerosis of aorta: Secondary | ICD-10-CM | POA: Insufficient documentation

## 2021-01-17 DIAGNOSIS — F418 Other specified anxiety disorders: Secondary | ICD-10-CM

## 2021-01-17 MED ORDER — ROSUVASTATIN CALCIUM 10 MG PO TABS: 10.0000 mg | ORAL_TABLET | ORAL | 1 refills | Status: DC

## 2021-01-17 NOTE — Assessment & Plan Note (Signed)
Reviewed findings of prior CT scan today..  Patient is willing to  Initiatestatin therapy starting with 10 MG ROSUVASTASTIN 2 TIMES WEEKLY

## 2021-01-17 NOTE — Assessment & Plan Note (Signed)
Taking 100 mg testosterone every 2 weeks.  Last dose 2 weeks ago

## 2021-01-17 NOTE — Progress Notes (Signed)
Subjective:  Patient ID: Theodore [name], male    DOB: Feb 01, 1930  Age: 87 y.o. MRN: [id number]  CC: The primary encounter diagnosis was Muscle weakness of proximal extremity. Diagnoses of Impaired fasting glucose, Low serum testosterone level, Hypothyroidism due to acquired atrophy of thyroid, Long-term use of high-risk medication, Thoracic aortic atherosclerosis ([place]), Depression with anxiety, and Elevated PSA, between 10 and less than 20 ng/ml were also pertinent to this visit.  HPI Theodore [name] presents for  Chief Complaint  Patient presents with   Follow-up    7 week follow up on anxiety    This visit occurred during the SARS-CoV-2 public health emergency.  Safety protocols were in place, including screening questions prior to the visit, additional usage of staff PPE, and extensive cleaning of exam room while observing appropriate contact time as indicated for disinfecting solutions.   GAD:  seen 2 months ago and started on zoloft .  Taking zoloft   Low T:  taking testosterone  needS PS AND OTHER LABS   Recurrent episodes of Dyspnea , leg weakness and fatigue:  ER and cardiology notes reviewed ; comprehensive workup planned by cardiology given 3 vessel disease noted on prior imaging study.  Wearing a Zio monitor and ECHO ordered.  Symptoms are intermittent and do not occur with every physical exertion . Often goes several days without symptoms, then becomes profoundly weak.  Has seen cardiology and pulmonology. Has tried using atrovent nebulizer.    Has had 2 ER visits, the first time left without being seen by the physician due to the wait . Home pulse ox reading ONCE was 80%  but this has not been duplicated during ER visits or a home since the initial reading    Aortic atherosclerosis :  Discussed need for statin therapy given documented evidence of moderate  atherosclerosis in the aorta noted on recent  CT of abdomen and  pelvis and the prognostic implications of this finding.  He is  willing to start crestor at  ever 3 days dosing  Taking a mushroom extract recommended by an acupunturist to lower his PSA  Last dose of testosterone was 15 days ago   dosing is every 15 days.    Outpatient Medications Prior to Visit  Medication Sig Dispense Refill   Alpha-Lipoic Acid 600 MG CAPS Take by mouth.     ascorbic Acid (VITAMIN C) 500 MG CPCR Take by mouth.     b complex vitamins tablet Take by mouth.     buPROPion (WELLBUTRIN XL) 150 MG 24 hr tablet TAKE 1 TABLET BY MOUTH EVERY DAY 90 tablet 1   Cholecalciferol 125 MCG (5000 UT) TABS Take by mouth.     COD LIVER OIL PO Take by mouth.     Coenzyme Q10 100 MG capsule Take by mouth.     cycloSPORINE (RESTASIS) 0.05 % ophthalmic emulsion Restasis 0.05 % eye drops in a dropperette  INSTILL 1 DROP INTO BOTH EYES TWICE A DAY     donepezil (ARICEPT) 5 MG tablet Take by mouth.     ezetimibe (ZETIA) 10 MG tablet Take 1 tablet (10 mg total) by mouth daily. 90 tablet 3   fluorometholone (FML) 0.1 % ophthalmic suspension Place 1 drop into the right eye 2 (two) times daily.     ipratropium-albuterol (DUONEB) 0.5-2.5 (3) MG/3ML SOLN Take 3 mLs by nebulization every 6 (six) hours as needed. 360 mL 0   levothyroxine (SYNTHROID) 100 MCG tablet TAKE 1 TABLET BY MOUTH  EVERY DAY BEFORE BREAKFAST 90 tablet 0   magnesium oxide (MAG-OX) 400 MG tablet Take 400 mg by mouth daily.     OVER THE COUNTER MEDICATION Take 4 tablets by mouth daily. Standard Process- Cardio Plus     OVER THE COUNTER MEDICATION Take 1 tablet by mouth daily. Cardio Platinum - Vitamin K-2 500 mg, Arginine 300 mg, Natokinase 100 mg     OVER THE COUNTER MEDICATION Take by mouth daily. Hemp oil     Probiotic Product (PROBIOTIC + TURMERIC EXTRACT) 400 MG CAPS Take by mouth.     Probiotic Product (PROBIOTIC ACIDOPHILUS) CAPS Take 1 capsule by mouth daily.     sertraline (ZOLOFT) 50 MG tablet TAKE 1 TABLET (50 MG TOTAL) BY MOUTH DAILY. WITH A MEAL. 90 tablet 2   testosterone cypionate  (DEPOTESTOSTERONE CYPIONATE) 200 MG/ML injection INJECT 0.5 ML (100 MG TOTAL) INTO THE MUSCLE EVERY 14 DAYS. 1 mL 5   Turmeric 400 MG CAPS Take by mouth.     No facility-administered medications prior to visit.    Review of Systems;  Patient denies headache, fevers, malaise, unintentional weight loss, skin rash, eye pain, sinus congestion and sinus pain, sore throat, dysphagia,  hemoptysis , cough, dyspnea, wheezing, chest pain, palpitations, orthopnea, edema, abdominal pain, nausea, melena, diarrhea, constipation, flank pain, dysuria, hematuria, urinary  Frequency, nocturia, numbness, tingling, seizures,  Focal weakness, Loss of consciousness,  Tremor, insomnia, depression, anxiety, and suicidal ideation.      Objective:  BP 128/74 (BP Location: Left Arm, Patient Position: Sitting, Cuff Size: Normal)   Pulse (!) 58   Temp (!) 96.3 F (35.7 C) (Temporal)   Ht 6\' 3"  (1.905 m)   Wt 168 lb 9.6 oz (76.5 kg)   SpO2 98%   BMI 21.07 kg/m   BP Readings from Last 3 Encounters:  01/17/21 128/74  01/05/21 118/60  01/03/21 (!) 150/71    Wt Readings from Last 3 Encounters:  01/17/21 168 lb 9.6 oz (76.5 kg)  01/05/21 168 lb (76.2 kg)  01/03/21 171 lb 15.3 oz (78 kg)    General appearance: alert, cooperative and appears stated age Ears: normal TM's and external ear canals both ears Throat: lips, mucosa, and tongue normal; teeth and gums normal Neck: no adenopathy, no carotid bruit, supple, symmetrical, trachea midline and thyroid not enlarged, symmetric, no tenderness/mass/nodules Back: symmetric, no curvature. ROM normal. No CVA tenderness. Lungs: clear to auscultation bilaterally Heart: regular rate and rhythm, S1, S2 normal, no murmur, click, rub or gallop Abdomen: soft, non-tender; bowel sounds normal; no masses,  no organomegaly Pulses: 2+ and symmetric Skin: Skin color, texture, turgor normal. No rashes or lesions Lymph nodes: Cervical, supraclavicular, and axillary nodes  normal.  Lab Results  Component Value Date   HGBA1C 4.7 05/28/2018    Lab Results  Component Value Date   CREATININE 1.35 (H) 01/03/2021   CREATININE 1.39 (H) 12/28/2020   CREATININE 1.46 10/25/2020    Lab Results  Component Value Date   WBC 5.0 01/03/2021   HGB 15.2 01/03/2021   HCT 43.7 01/03/2021   PLT 206 01/03/2021   GLUCOSE 92 01/03/2021   CHOL 197 09/21/2020   TRIG 70.0 09/21/2020   HDL 62.00 09/21/2020   LDLCALC 121 (H) 09/21/2020   ALT 17 01/03/2021   AST 23 01/03/2021   NA 136 01/03/2021   K 4.3 01/03/2021   CL 102 01/03/2021   CREATININE 1.35 (H) 01/03/2021   BUN 23 01/03/2021   CO2 26 01/03/2021  TSH 1.39 09/23/2019   PSA 16.19 (H) 09/21/2020   HGBA1C 4.7 05/28/2018    DG Chest 2 View  Result Date: 01/03/2021 CLINICAL DATA:  Shortness of breath. EXAM: CHEST - 2 VIEW COMPARISON:  Prior chest radiographs 12/28/2020 and earlier. FINDINGS: Heart size within normal limits. Aortic atherosclerosis. Scarring within the left lung base. No appreciable airspace consolidation or pulmonary edema. No evidence of pleural effusion or pneumothorax. No acute bony abnormality identified. Degenerative changes of the spine. IMPRESSION: No evidence of acute cardiopulmonary abnormality. Redemonstrated scarring within the left lung base. Aortic Atherosclerosis (ICD10-I70.0). Electronically Signed   By: Kellie Simmering D.O.   On: 01/03/2021 14:30    Assessment & Plan:   Problem List Items Addressed This Visit     Depression with anxiety    ADVISED to continue sertraline  counseling given      Low serum testosterone level    Taking 100 mg testosterone every 2 weeks.  Last dose 2 weeks ago       Long-term use of high-risk medication   Hypothyroidism   Elevated PSA, between 10 and less than 20 ng/ml    He continues to rely on naturopathic remedies.  Taking an extract of 30 varieties of mushrooms recommended by his wife's acupuncturist that is supposed to lower his PSA       Thoracic aortic atherosclerosis [facility])    Reviewed findings of prior CT scan today..  Patient is willing to  Initiatestatin therapy starting with 10 MG ROSUVASTASTIN 2 TIMES WEEKLY          Relevant Medications   rosuvastatin (CRESTOR) 10 MG tablet   Other Visit Diagnoses     Muscle weakness of proximal extremity    -  Primary   Relevant Orders   CK (Creatine Kinase)   Impaired fasting glucose       Relevant Orders   Hemoglobin A1c       I provided  40 minutes  during this encounter reviewing patient's current problems and past surgeries, labs and imaging studies, providing counseling on the above mentioned problems I na face to face visit  , and coordination  of care .  Meds ordered this encounter  Medications   rosuvastatin (CRESTOR) 10 MG tablet    Sig: Take 1 tablet (10 mg total) by mouth every other day.    Dispense:  45 tablet    Refill:  1    There are no discontinued medications.  Follow-up: No follow-ups on file.   [name], MD

## 2021-01-17 NOTE — Assessment & Plan Note (Signed)
ADVISED to continue sertraline  counseling given

## 2021-01-17 NOTE — Assessment & Plan Note (Signed)
He continues to rely on naturopathic remedies.  Taking an extract of 30 varieties of mushrooms recommended by his wife's acupuncturist that is supposed to lower his PSA

## 2021-01-17 NOTE — Patient Instructions (Addendum)
  Your aortic atherosclerosis places you at increased risk for heart attack and stroke  I recommend that you repeat a trial of Crestor .  Start with one tablet two times per week  (NOT DAILY)  If you tolerate taking IT ONCE A WEEK  AFTER A FEW WEEKS increase to every other day

## 2021-01-18 LAB — CBC WITH DIFFERENTIAL/PLATELET
Basophils Absolute: 0 10*3/uL (ref 0.0–0.1)
Basophils Relative: 0.8 % (ref 0.0–3.0)
Eosinophils Absolute: 0.2 10*3/uL (ref 0.0–0.7)
Eosinophils Relative: 3 % (ref 0.0–5.0)
HCT: 43.8 % (ref 39.0–52.0)
Hemoglobin: 14.9 g/dL (ref 13.0–17.0)
Lymphocytes Relative: 22 % (ref 12.0–46.0)
Lymphs Abs: 1.1 10*3/uL (ref 0.7–4.0)
MCHC: 34.1 g/dL (ref 30.0–36.0)
MCV: 94.3 fl (ref 78.0–100.0)
Monocytes Absolute: 0.4 10*3/uL (ref 0.1–1.0)
Monocytes Relative: 8.5 % (ref 3.0–12.0)
Neutro Abs: 3.3 10*3/uL (ref 1.4–7.7)
Neutrophils Relative %: 65.7 % (ref 43.0–77.0)
Platelets: 243 10*3/uL (ref 150.0–400.0)
RBC: 4.64 Mil/uL (ref 4.22–5.81)
RDW: 13.3 % (ref 11.5–15.5)
WBC: 5 10*3/uL (ref 4.0–10.5)

## 2021-01-18 LAB — COMPREHENSIVE METABOLIC PANEL
ALT: 13 U/L (ref 0–53)
AST: 20 U/L (ref 0–37)
Albumin: 4.3 g/dL (ref 3.5–5.2)
Alkaline Phosphatase: 40 U/L (ref 39–117)
BUN: 32 mg/dL — ABNORMAL HIGH (ref 6–23)
CO2: 25 mEq/L (ref 19–32)
Calcium: 9.1 mg/dL (ref 8.4–10.5)
Chloride: 101 mEq/L (ref 96–112)
Creatinine, Ser: 1.3 mg/dL (ref 0.40–1.50)
GFR: 48.67 mL/min — ABNORMAL LOW (ref >60.00)
Glucose, Bld: 78 mg/dL (ref 70–99)
Potassium: 4.2 mEq/L (ref 3.5–5.1)
Sodium: 136 mEq/L (ref 135–145)
Total Bilirubin: 1.4 mg/dL — ABNORMAL HIGH (ref 0.2–1.2)
Total Protein: 6.3 g/dL (ref 6.0–8.3)

## 2021-01-18 LAB — CK: Total CK: 92 U/L (ref 7–232)

## 2021-01-18 LAB — TESTOSTERONE TOTAL,FREE,BIO, MALES
Albumin: 4.2 g/dL (ref 3.6–5.1)
Sex Hormone Binding: 67 nmol/L (ref 22–77)
Testosterone, Bioavailable: 60.3 ng/dL (ref 15.0–150.0)
Testosterone, Free: 31.3 pg/mL (ref 6.0–73.0)
Testosterone: 439 ng/dL (ref 250–827)

## 2021-01-18 LAB — HEMOGLOBIN A1C: Hgb A1c MFr Bld: 4.8 % (ref 4.6–6.5)

## 2021-01-18 LAB — TSH: TSH: 1.55 u[IU]/mL (ref 0.35–5.50)

## 2021-01-20 ENCOUNTER — Telehealth: Payer: Self-pay | Admitting: Internal Medicine

## 2021-01-20 DIAGNOSIS — R2689 Other abnormalities of gait and mobility: Secondary | ICD-10-CM | POA: Diagnosis not present

## 2021-01-20 DIAGNOSIS — R972 Elevated prostate specific antigen [PSA]: Secondary | ICD-10-CM

## 2021-01-20 DIAGNOSIS — F418 Other specified anxiety disorders: Secondary | ICD-10-CM | POA: Diagnosis not present

## 2021-01-20 DIAGNOSIS — H04123 Dry eye syndrome of bilateral lacrimal glands: Secondary | ICD-10-CM | POA: Diagnosis not present

## 2021-01-20 DIAGNOSIS — R4189 Other symptoms and signs involving cognitive functions and awareness: Secondary | ICD-10-CM | POA: Diagnosis not present

## 2021-01-20 DIAGNOSIS — R202 Paresthesia of skin: Secondary | ICD-10-CM | POA: Diagnosis not present

## 2021-01-20 DIAGNOSIS — Z79899 Other long term (current) drug therapy: Secondary | ICD-10-CM

## 2021-01-20 DIAGNOSIS — G250 Essential tremor: Secondary | ICD-10-CM | POA: Diagnosis not present

## 2021-01-20 DIAGNOSIS — R200 Anesthesia of skin: Secondary | ICD-10-CM | POA: Diagnosis not present

## 2021-01-20 NOTE — Telephone Encounter (Signed)
Was pt supposed to have his PSA repeated at last appt?

## 2021-01-20 NOTE — Telephone Encounter (Signed)
Pt called in stating that he requested for a PSA lab order to be done. Pt stated that when he came in for his visit with Dr. Derrel Nip on 01/17/2021, he advise Dr. Derrel Nip and Janett Billow that he would like his PSA done. Pt stated when he looked at the result there were no PSA result.Pt is very irritated that he didn't get the PSA done. Pt stated that he was trying to compare his PSA to his last PSA. Pt stated that he advise Dr. Derrel Nip and Janett Billow several time. Pt would like callback.

## 2021-01-21 NOTE — Telephone Encounter (Signed)
Pt called and scheduled for both labs in three weeks.

## 2021-01-25 DIAGNOSIS — R001 Bradycardia, unspecified: Secondary | ICD-10-CM | POA: Diagnosis not present

## 2021-02-10 ENCOUNTER — Ambulatory Visit (INDEPENDENT_AMBULATORY_CARE_PROVIDER_SITE_OTHER): Payer: PPO

## 2021-02-10 ENCOUNTER — Other Ambulatory Visit: Payer: Self-pay

## 2021-02-10 DIAGNOSIS — R0609 Other forms of dyspnea: Secondary | ICD-10-CM | POA: Diagnosis not present

## 2021-02-10 LAB — ECHOCARDIOGRAM COMPLETE
AR max vel: 4.05 cm2
AV Area VTI: 3.85 cm2
AV Area mean vel: 3.44 cm2
AV Mean grad: 2 mmHg
AV Peak grad: 3.7 mmHg
Ao pk vel: 0.97 m/s
Area-P 1/2: 2.75 cm2
S' Lateral: 1.9 cm
Single Plane A4C EF: 64 %

## 2021-02-11 ENCOUNTER — Other Ambulatory Visit (INDEPENDENT_AMBULATORY_CARE_PROVIDER_SITE_OTHER): Payer: PPO

## 2021-02-11 ENCOUNTER — Telehealth: Payer: Self-pay | Admitting: *Deleted

## 2021-02-11 DIAGNOSIS — Z79899 Other long term (current) drug therapy: Secondary | ICD-10-CM | POA: Diagnosis not present

## 2021-02-11 DIAGNOSIS — R972 Elevated prostate specific antigen [PSA]: Secondary | ICD-10-CM | POA: Diagnosis not present

## 2021-02-11 LAB — COMPREHENSIVE METABOLIC PANEL
ALT: 18 U/L (ref 0–53)
AST: 24 U/L (ref 0–37)
Albumin: 4.6 g/dL (ref 3.5–5.2)
Alkaline Phosphatase: 43 U/L (ref 39–117)
BUN: 26 mg/dL — ABNORMAL HIGH (ref 6–23)
CO2: 29 mEq/L (ref 19–32)
Calcium: 9.6 mg/dL (ref 8.4–10.5)
Chloride: 100 mEq/L (ref 96–112)
Creatinine, Ser: 1.34 mg/dL (ref 0.40–1.50)
GFR: 46.91 mL/min — ABNORMAL LOW (ref >60.00)
Glucose, Bld: 85 mg/dL (ref 70–99)
Potassium: 4.3 mEq/L (ref 3.5–5.1)
Sodium: 138 mEq/L (ref 135–145)
Total Bilirubin: 1.4 mg/dL — ABNORMAL HIGH (ref 0.2–1.2)
Total Protein: 6.8 g/dL (ref 6.0–8.3)

## 2021-02-11 LAB — PSA: PSA: 21.68 ng/mL — ABNORMAL HIGH (ref 0.10–4.00)

## 2021-02-11 NOTE — Telephone Encounter (Signed)
Called Mobile number and left voicemail message.

## 2021-02-11 NOTE — Telephone Encounter (Signed)
Left voicemail message to call back for review of results.  

## 2021-02-11 NOTE — Telephone Encounter (Signed)
-----   Message from Rise Mu, PA-C sent at 02/10/2021  4:53 PM EST ----- Echo showed normal pump function, normal wall motion, mild thickening of the heart, slightly stiffened heart, and no significant valvular abnormalities.  Overall reassuring study.  Follow-up as planned next week to reassess symptoms and to discuss if further testing is indicated.

## 2021-02-14 NOTE — Telephone Encounter (Signed)
Reviewed results and recommendations with patient. Confirmed appointment for tomorrow and he verbalized understanding with no further questions at this time.

## 2021-02-15 ENCOUNTER — Encounter: Payer: Self-pay | Admitting: Cardiovascular Disease

## 2021-02-15 ENCOUNTER — Other Ambulatory Visit: Payer: Self-pay

## 2021-02-15 ENCOUNTER — Ambulatory Visit: Payer: PPO | Admitting: Cardiovascular Disease

## 2021-02-15 VITALS — BP 130/60 | HR 59 | Ht 75.0 in | Wt 168.0 lb

## 2021-02-15 DIAGNOSIS — R001 Bradycardia, unspecified: Secondary | ICD-10-CM

## 2021-02-15 DIAGNOSIS — I493 Ventricular premature depolarization: Secondary | ICD-10-CM | POA: Diagnosis not present

## 2021-02-15 DIAGNOSIS — I739 Peripheral vascular disease, unspecified: Secondary | ICD-10-CM | POA: Diagnosis not present

## 2021-02-15 DIAGNOSIS — I700 Atherosclerosis of aorta: Secondary | ICD-10-CM | POA: Diagnosis not present

## 2021-02-15 DIAGNOSIS — R0609 Other forms of dyspnea: Secondary | ICD-10-CM

## 2021-02-15 DIAGNOSIS — J449 Chronic obstructive pulmonary disease, unspecified: Secondary | ICD-10-CM | POA: Diagnosis not present

## 2021-02-15 DIAGNOSIS — I25119 Atherosclerotic heart disease of native coronary artery with unspecified angina pectoris: Secondary | ICD-10-CM | POA: Diagnosis not present

## 2021-02-15 NOTE — Progress Notes (Signed)
Cardiology Office Note  Date:  02/15/2021   ID:  Theodore [name], DOB 1929/10/16, MRN [id number]  PCP:  [name], MD   Chief Complaint  Patient presents with   6 week follow up     Discuss Echo & Zio results. Medications reviewed by the patient verbally. Patient c/o shortness of breath at times and leg weakness.     HPI:  87 yo gentleman who lives at [facility] [place] with notes indicating a history of asymptomatic bradycardia,  prior history of depression,  who presented to the hospital with episodes of shortness of breath, lightheadedness, fatigue 05/10/2016 COPD , dx by pulmonary, dr. Mortimer Fries Covid 03/2020, scarring within the left lung base. Who presents for follow-up of his sinus bradycardia and shortness of breath  Recently seen in clinic by one of our providers January 05, 2021 Reported having fatigue, shortness of breath, presyncope after ambulating stairs Episodes lasting several hours  Reports losing 20 pounds  Given amoxicillin by dentist Has an allergy Developed and episode of spinning, weakness, SOB, significant rash Getting better , still with rash on his legs Penicillin on his allergy list  Plays the [place], Chronic shortness of breath   EKG personally reviewed by myself on todays visit Shows sinus bradycardia rate 53 bpm no significant ST-T wave changes  Recent imaging and studies reviewed in detail  Zio monitor, reviewed on today's visit Patch Wear Time:  13 days and 9 hours  Normal sinus rhythm,  min HR of 42 bpm, max HR of 160 bpm, and avg HR of 59 bpm. ----15 Supraventricular Tachycardia runs occurred, the run with the fastest interval lasting 10 beats with a max rate of 160 bpm, the longest lasting 9 beats with an avg rate of 93 bpm. ---- Isolated SVEs were rare (<1.0%), SVE Couplets were rare (<1.0%), and SVE Triplets were rare (<1.0%). Isolated VEs were rare (<1.0%), VE Couplets were rare (<1.0%), and no VE Triplets were present.  Ventricular  Bigeminy and Trigeminy were present.  ----Patient triggered events associated with normal sinus rhythm, no significant arrhythmia  Echo February 10, 2021 reviewed today  1. Left ventricular ejection fraction, by estimation, is 65 to 70%. The  left ventricle has normal function. The left ventricle has no regional  wall motion abnormalities. There is mild left ventricular hypertrophy.  Left ventricular diastolic parameters  are consistent with Grade I diastolic dysfunction (impaired relaxation).   2. Right ventricular systolic function is normal. The right ventricular  size is not well visualized.   3. The mitral valve is grossly normal. No evidence of mitral valve  regurgitation.   4. The aortic valve was not well visualized. Aortic valve regurgitation  is not visualized.   5. The inferior vena cava is normal in size with greater than 50%  respiratory variability, suggesting right atrial pressure of 3 mmHg.   Previously declined cholesterol medication Denies any chest pain concerning for angina, no claudication symptoms  CT scan performed previously with aortic atherosclerosis ,coronary calcification ABI studies :moderate bilateral disease  Other past medical history reviewed Cardiopulmonary stress testing was ordered 2019  gas exchange demonstrates excellent functional capacity no evidence of cardiopulmonary limitations.  At peak exercise, patient approached ventilatory limits and is likely hyperventilating and shown with elevated VE/VCO2 slope.  EKG personally reviewed by myself on todays visit Shows sinus bradycardia rate 53 bpm no significant ST-T wave changes  Other past medical history reviewed Prior history of depression October 2017,  Weight loss with anorexia  CT scan done in the hospital showing no significant PE contribute to shortness of breath  shows three-vessel coronary calcifications at least moderate in severity, aortic arch calcification mild to  moderate   PMH:   has a past medical history of Arthritis, Asymptomatic Sinus bradycardia, Benign essential tremor, Cancer ([place]) (2002), Colon polyp, COPD (chronic obstructive pulmonary disease) ([place]), Coronary artery calcification seen on CT scan, Depression, History of echocardiogram, History of kidney stones, dysplastic nevus (05/07/2017), dysplastic nevus (09/06/2020), Hypothyroidism, Hypothyroidism, Melanoma ([place]) (1990's per pt), Melanoma in situ ([place]) (01/12/2014), Palpitations, PVC's (premature ventricular contractions), Rhinitis, Wears dentures, and Wears hearing aid in both ears.  PSH:    Past Surgical History:  Procedure Laterality Date   ADENOIDECTOMY     CATARACT EXTRACTION W/PHACO Left 10/22/2017   Procedure: CATARACT EXTRACTION PHACO AND INTRAOCULAR LENS PLACEMENT ([place])  LEFT;  Surgeon: Eulogio Bear, MD;  Location: [facility];  Service: Ophthalmology;  Laterality: Left;   CATARACT EXTRACTION W/PHACO Right 11/20/2017   Procedure: CATARACT EXTRACTION PHACO AND INTRAOCULAR LENS PLACEMENT ([facility]) RIGHT;  Surgeon: Eulogio Bear, MD;  Location: [facility];  Service: Ophthalmology;  Laterality: Right;   ESOPHAGOGASTRODUODENOSCOPY (EGD) WITH PROPOFOL N/A 10/31/2016   Procedure: ESOPHAGOGASTRODUODENOSCOPY (EGD) WITH PROPOFOL;  Surgeon: Lollie Sails, MD;  Location[facility] ENDOSCOPY;  Service: Endoscopy;  Laterality: N/A;   JOINT REPLACEMENT Right 2003   knee   knee meniscus repair Right    MELANOMA EXCISION Right    ear. Followed by Dr. Nehemiah Massed   PATELLECTOMY Bilateral    TONSILLECTOMY      Current Outpatient Medications  Medication Sig Dispense Refill   Alpha-Lipoic Acid 600 MG CAPS Take by mouth.     ascorbic Acid (VITAMIN C) 500 MG CPCR Take by mouth.     b complex vitamins tablet Take by mouth.     buPROPion (WELLBUTRIN XL) 150 MG 24 hr tablet TAKE 1 TABLET BY MOUTH EVERY DAY 90 tablet 1   Cholecalciferol 125 MCG (5000 UT) TABS Take by mouth.      Coenzyme Q10 100 MG capsule Take by mouth.     cycloSPORINE (RESTASIS) 0.05 % ophthalmic emulsion Restasis 0.05 % eye drops in a dropperette  INSTILL 1 DROP INTO BOTH EYES TWICE A DAY     donepezil (ARICEPT) 5 MG tablet Take by mouth.     ezetimibe (ZETIA) 10 MG tablet Take 1 tablet (10 mg total) by mouth daily. 90 tablet 3   fluorometholone (FML) 0.1 % ophthalmic suspension Place 1 drop into the right eye 2 (two) times daily.     ipratropium-albuterol (DUONEB) 0.5-2.5 (3) MG/3ML SOLN Take 3 mLs by nebulization every 6 (six) hours as needed. 360 mL 0   levothyroxine (SYNTHROID) 100 MCG tablet TAKE 1 TABLET BY MOUTH EVERY DAY BEFORE BREAKFAST 90 tablet 0   magnesium oxide (MAG-OX) 400 MG tablet Take 400 mg by mouth daily.     OVER THE COUNTER MEDICATION Take 4 tablets by mouth daily. Standard Process- Cardio Plus     OVER THE COUNTER MEDICATION Take 1 tablet by mouth daily. Cardio Platinum - Vitamin K-2 500 mg, Arginine 300 mg, Natokinase 100 mg     OVER THE COUNTER MEDICATION Take by mouth daily. Hemp oil     Probiotic Product (PROBIOTIC + TURMERIC EXTRACT) 400 MG CAPS Take by mouth.     Probiotic Product (PROBIOTIC ACIDOPHILUS) CAPS Take 1 capsule by mouth daily.     rosuvastatin (CRESTOR) 10 MG tablet Take 1 tablet (  10 mg total) by mouth every other day. 45 tablet 1   sertraline (ZOLOFT) 50 MG tablet TAKE 1 TABLET (50 MG TOTAL) BY MOUTH DAILY. WITH A MEAL. 90 tablet 2   testosterone cypionate (DEPOTESTOSTERONE CYPIONATE) 200 MG/ML injection INJECT 0.5 ML (100 MG TOTAL) INTO THE MUSCLE EVERY 14 DAYS. 1 mL 5   Turmeric 400 MG CAPS Take by mouth.     COD LIVER OIL PO Take by mouth. (Patient not taking: Reported on 02/15/2021)     No current facility-administered medications for this visit.    Allergies:   Penicillins and Lexapro [escitalopram]   Social History:  The patient  reports that he has never smoked. He has never used smokeless tobacco. He reports current alcohol use. He reports that  he does not use drugs.   Family History:   family history includes Cancer in his sister; Heart disease in his father and mother; Hypertension in his mother.    Review of Systems: Review of Systems  Constitutional: Negative.   Respiratory: Negative.    Cardiovascular: Negative.   Gastrointestinal: Negative.   Musculoskeletal: Negative.   Neurological: Negative.   Psychiatric/Behavioral: Negative.    All other systems reviewed and are negative.  PHYSICAL EXAM: Vitals:   02/15/21 1454  BP: 130/60  Pulse: (!) 59  SpO2: 97%   Body mass index is 21 kg/m.  Vitals:   02/15/21 1454  Weight: 168 lb (76.2 kg)  Height: 6\' 3"  (1.905 m)   Constitutional:  oriented to person, place, and time. No distress.  HENT:  Head: Grossly normal Eyes:  no discharge. No scleral icterus.  Neck: No JVD, no carotid bruits  Cardiovascular: Regular rate and rhythm, no murmurs appreciated Pulmonary/Chest: Clear to auscultation bilaterally, no wheezes or rails Abdominal: Soft.  no distension.  no tenderness.  Musculoskeletal: Normal range of motion Neurological:  normal muscle tone. Coordination normal. No atrophy Skin: Skin warm and dry Psychiatric: normal affect, pleasant   Recent Labs: 01/03/2021: B Natriuretic Peptide 126.7 01/17/2021: Hemoglobin 14.9; Platelets 243.0; TSH 1.55 02/11/2021: ALT 18; BUN 26; Creatinine, Ser 1.34; Potassium 4.3; Sodium 138    Lipid Panel Lab Results  Component Value Date   CHOL 197 09/21/2020   HDL 62.00 09/21/2020   LDLCALC 121 (H) 09/21/2020   TRIG 70.0 09/21/2020      Wt Readings from Last 3 Encounters:  02/15/21 168 lb (76.2 kg)  01/17/21 168 lb 9.6 oz (76.5 kg)  01/05/21 168 lb (76.2 kg)       ASSESSMENT AND PLAN:  Bradycardia - Plan: EKG 12-Lead Asymptomatic bradycardia, rate in the 50s Unchanged, we will not use beta-blockers for any of his other arrhythmia as he is not very symptomatic  Depression with anxiety Managed by primary care In  good spirits today  Aortic atherosclerosis ([place]) Zetia refilled Does not want a statin Discussed significant aortic atherosclerosis on CT scan  COPD Managed by pulmonary, on inhalers Chronic shortness of breath,  Regular walking program  Coronary artery disease involving native coronary artery of native heart with angina pectoris (HCC) Chronic shortness of breath, discussed doing a stress test, he will think about it and let us know Other studies including echocardiogram, no further work-up needed We recommended regular walking program  PAD Mild to moderate aortic atherosclerosis seen on prior imaging He does not want a statin On Zetia  Essential tremor Prior successful procedure Stable      Total encounter time more than 25 minutes  Greater than 50% was  spent in counseling and coordination of care with the patient    No orders of the defined types were placed in this encounter.    Signed, Esmond Plants, M.D., Ph.D. 02/15/2021  [place], [place]

## 2021-02-15 NOTE — Patient Instructions (Addendum)
If breathing gets worse, or any chest pain, Call the office, we could order a nuclear stress test  Medication Instructions:  No changes  If you need a refill on your cardiac medications before your next appointment, please call your pharmacy.   Lab work: No new labs needed  Testing/Procedures: No new testing needed   Follow-Up: At [facility], you and your health needs are our priority.  As part of our continuing mission to provide you with exceptional heart care, we have created designated Provider Care Teams.  These Care Teams include your primary Cardiologist (physician) and Advanced Practice Providers (APPs -  Physician Assistants and Nurse Practitioners) who all work together to provide you with the care you need, when you need it.  You will need a follow up appointment in 12 months  Providers on your designated Care Team:   Murray Hodgkins, NP Christell Faith, PA-C Cadence Kathlen Mody, [place]  COVID-19 Vaccine Information can be found at: ShippingScam.co.uk For questions related to vaccine distribution or appointments, please email vaccine@[email or web address] .com or call [phone].

## 2021-02-22 DIAGNOSIS — F3342 Major depressive disorder, recurrent, in full remission: Secondary | ICD-10-CM | POA: Diagnosis not present

## 2021-02-22 DIAGNOSIS — I2510 Atherosclerotic heart disease of native coronary artery without angina pectoris: Secondary | ICD-10-CM | POA: Diagnosis not present

## 2021-02-22 DIAGNOSIS — I739 Peripheral vascular disease, unspecified: Secondary | ICD-10-CM | POA: Diagnosis not present

## 2021-02-22 DIAGNOSIS — J449 Chronic obstructive pulmonary disease, unspecified: Secondary | ICD-10-CM | POA: Diagnosis not present

## 2021-02-23 ENCOUNTER — Encounter: Payer: Self-pay | Admitting: Internal Medicine

## 2021-02-24 ENCOUNTER — Encounter: Payer: Self-pay | Admitting: Family

## 2021-02-24 ENCOUNTER — Other Ambulatory Visit: Payer: Self-pay

## 2021-02-24 ENCOUNTER — Ambulatory Visit (INDEPENDENT_AMBULATORY_CARE_PROVIDER_SITE_OTHER): Payer: PPO | Admitting: Family

## 2021-02-24 ENCOUNTER — Ambulatory Visit (INDEPENDENT_AMBULATORY_CARE_PROVIDER_SITE_OTHER): Payer: PPO

## 2021-02-24 VITALS — BP 140/64 | HR 60 | Temp 97.9°F | Ht 75.0 in | Wt 171.8 lb

## 2021-02-24 DIAGNOSIS — M5416 Radiculopathy, lumbar region: Secondary | ICD-10-CM | POA: Diagnosis not present

## 2021-02-24 DIAGNOSIS — M47816 Spondylosis without myelopathy or radiculopathy, lumbar region: Secondary | ICD-10-CM | POA: Diagnosis not present

## 2021-02-24 DIAGNOSIS — G8929 Other chronic pain: Secondary | ICD-10-CM

## 2021-02-24 DIAGNOSIS — M5440 Lumbago with sciatica, unspecified side: Secondary | ICD-10-CM

## 2021-02-24 MED ORDER — PREDNISONE 10 MG (21) PO TBPK: ORAL_TABLET | ORAL | 0 refills | Status: DC

## 2021-02-24 NOTE — Telephone Encounter (Signed)
Pt was seen at our office today by Dutch Quint, NP.

## 2021-02-24 NOTE — Telephone Encounter (Signed)
Left message to call office

## 2021-02-24 NOTE — Progress Notes (Signed)
Acute Office Visit  Subjective:    Patient ID: Theodore [name], male    DOB: Sep 20, 1929, 87 y.o.   MRN: [id number]  Chief Complaint  Patient presents with   Acute Visit    Back pain    HPI Patient is in today with c/o low back pain x 2 weeks that has been intermittent, 4/10. Pain is worse with lying down, standing a lot, or walking a while. Pain is on the left lower back and radiates down the left leg. Has been using a decompression belt that helps and has seen a chiropractor in the past. Pain appears to be worse now than in previous times. Has been applying lineament.    Past Medical History:  Diagnosis Date   Arthritis    knees   Asymptomatic Sinus bradycardia    a. 05/2016 Zio: Avg HR 61 (41-167).   Benign essential tremor    Cancer ([place]) 2002   melanoma right ear   Colon polyp    COPD (chronic obstructive pulmonary disease) ([place])    pt said he believes it is a misdiagnosis    Coronary artery calcification seen on CT scan    a. 06/2016 CTA Chest: cor Ca2+; b. 05/2017 MV: EF >65%. No ischemia/infarct.   Depression    History of echocardiogram    a. 04/2016 Echo: EF 60-65%, mild conc LVH. Nl PASP.   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 05/2016 Zio: Avg HR 61 (41-167). 9 SVT runs (fastest 167 - 5 beats; longest 8 beats - 101 bpm). Rare PACs/PVCs.   PVC's (premature ventricular contractions)    Rhinitis    Wears dentures    partial upper and lower   Wears hearing aid in both ears     Past Surgical History:  Procedure Laterality Date   ADENOIDECTOMY     CATARACT EXTRACTION W/PHACO Left 10/22/2017   Procedure: CATARACT EXTRACTION PHACO AND INTRAOCULAR LENS PLACEMENT (IOC)  LEFT;  Surgeon: Eulogio Bear, MD;  Location: [place] [place];  Service: Ophthalmology;  Laterality: Left;   CATARACT EXTRACTION W/PHACO Right 11/20/2017   Procedure: CATARACT EXTRACTION PHACO AND INTRAOCULAR LENS PLACEMENT (IOC) RIGHT;  Surgeon: Eulogio Bear, MD;  Location: [facility];  Service: Ophthalmology;  Laterality: Right;   ESOPHAGOGASTRODUODENOSCOPY (EGD) WITH PROPOFOL N/A 10/31/2016   Procedure: ESOPHAGOGASTRODUODENOSCOPY (EGD) WITH PROPOFOL;  Surgeon: Lollie Sails, MD;  Location: [facility] ENDOSCOPY;  Service: Endoscopy;  Laterality: N/A;   JOINT REPLACEMENT Right 2003   knee   knee meniscus repair Right    MELANOMA EXCISION Right    ear. Followed by Dr. Nehemiah Massed   PATELLECTOMY Bilateral    TONSILLECTOMY      Family History  Problem Relation Age of Onset   Hypertension Mother    Heart disease Mother        CHF   Heart disease Father    Cancer Sister        melanoma    Social History   Socioeconomic History   Marital status: Married    Spouse name: [name]   Number of children: 4   Years of education: 66  Highest education level: Not on file  Occupational History   Occupation: Retired    Comment: Architect -   Tobacco Use   Smoking status: Never   Smokeless tobacco: Never  Vaping Use   Vaping Use: Never used  Substance and Sexual Activity   Alcohol use: Yes    Comment: 1 glass of wine daily   Drug use: No   Sexual activity: Not Currently  Other Topics Concern   Not on file  Social History Narrative   Mr. Duerst grew up in [place], [place]. 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 He and his wife are living at [facility]. He is very active at [facility]. He enjoys music.    Social Determinants of Health   Financial Resource Strain: Low Risk     Difficulty of Paying Living Expenses: Not hard at all  Food Insecurity: No Food Insecurity   Worried About Charity fundraiser in the Last Year: Never true   [place] in the Last Year: Never true  Transportation Needs: No Transportation Needs   Lack of Transportation (Medical): No   Lack of Transportation (Non-Medical): No  Physical Activity: Insufficiently Active   Days of Exercise per Week: 4 days   Minutes of Exercise per Session: 30 min  Stress: No Stress Concern Present   Feeling of Stress : Only a little  Social Connections: Engineer, building services of Communication with Friends and Family: More than three times a week   Frequency of Social Gatherings with Friends and Family: Once a week   Attends Religious Services: 1 to 4 times per year   Active Member of [employer] or Organizations: Yes   Attends Music therapist: More than 4 times per year   Marital Status: Married  Human resources officer Violence: Not At Risk   Fear of Current or Ex-Partner: No   Emotionally Abused: No   Physically Abused: No   Sexually Abused: No    Outpatient Medications Prior to Visit  Medication Sig Dispense Refill   Alpha-Lipoic Acid 600 MG CAPS Take by mouth.     ascorbic Acid (VITAMIN C) 500 MG CPCR Take by mouth.     b complex vitamins tablet Take by mouth.     buPROPion (WELLBUTRIN XL) 150 MG 24 hr tablet TAKE 1 TABLET BY MOUTH EVERY DAY 90 tablet 1   Cholecalciferol 125 MCG (5000 UT) TABS Take by mouth.     Coenzyme Q10 100 MG capsule Take by mouth.     cycloSPORINE (RESTASIS) 0.05 % ophthalmic emulsion Restasis 0.05 % eye drops in a dropperette  INSTILL 1 DROP INTO BOTH EYES TWICE A DAY     donepezil (ARICEPT) 5 MG tablet Take by mouth.     ezetimibe (ZETIA) 10 MG tablet Take 1 tablet (10 mg total) by mouth daily. 90 tablet 3   fluorometholone (FML) 0.1 % ophthalmic suspension Place 1 drop into the right eye 2 (two) times daily.     ipratropium-albuterol (DUONEB) 0.5-2.5  (3) MG/3ML SOLN Take 3 mLs by nebulization every 6 (six) hours as needed. 360 mL 0   levothyroxine (SYNTHROID) 100 MCG tablet TAKE 1 TABLET BY MOUTH EVERY DAY BEFORE BREAKFAST 90 tablet 0   magnesium oxide (MAG-OX) 400 MG tablet Take 400 mg by mouth  daily.     OVER THE COUNTER MEDICATION Take 4 tablets by mouth daily. Standard Process- Cardio Plus     OVER THE COUNTER MEDICATION Take 1 tablet by mouth daily. Cardio Platinum - Vitamin K-2 500 mg, Arginine 300 mg, Natokinase 100 mg     OVER THE COUNTER MEDICATION Take by mouth daily. Hemp oil     Probiotic Product (PROBIOTIC + TURMERIC EXTRACT) 400 MG CAPS Take by mouth.     Probiotic Product (PROBIOTIC ACIDOPHILUS) CAPS Take 1 capsule by mouth daily.     rosuvastatin (CRESTOR) 10 MG tablet Take 1 tablet (10 mg total) by mouth every other day. 45 tablet 1   sertraline (ZOLOFT) 50 MG tablet TAKE 1 TABLET (50 MG TOTAL) BY MOUTH DAILY. WITH A MEAL. 90 tablet 2   testosterone cypionate (DEPOTESTOSTERONE CYPIONATE) 200 MG/ML injection INJECT 0.5 ML (100 MG TOTAL) INTO THE MUSCLE EVERY 14 DAYS. 1 mL 5   Turmeric 400 MG CAPS Take by mouth.     No facility-administered medications prior to visit.    Allergies  Allergen Reactions   Penicillins Rash and Other (See Comments)    Other reaction(s): Other (see comments) Other reaction(s): UNKNOWN   Lexapro [Escitalopram] Other (See Comments)    Increased hand tremor    Review of Systems  Respiratory: Negative.    Cardiovascular: Negative.   Genitourinary: Negative.  Negative for flank pain and hematuria.  Musculoskeletal:  Positive for back pain.  Skin: Negative.   Neurological: Negative.   Psychiatric/Behavioral: Negative.    All other systems reviewed and are negative.     Objective:    Physical Exam Vitals reviewed.  Constitutional:      Appearance: Normal appearance.  Cardiovascular:     Rate and Rhythm: Normal rate and regular rhythm.  Pulmonary:     Effort: Pulmonary effort is  normal.     Breath sounds: Normal breath sounds.  Abdominal:     General: Abdomen is flat. Bowel sounds are normal.     Palpations: Abdomen is soft.  Musculoskeletal:     Cervical back: Normal range of motion and neck supple.     Comments: Low back pain elicited with lying down. Negative SLR. No pain with rotation of the torso.   Skin:    General: Skin is warm and dry.  Neurological:     General: No focal deficit present.     Mental Status: He is alert and oriented to person, place, and time.     Cranial Nerves: No cranial nerve deficit.     Sensory: No sensory deficit.     Motor: No weakness.     Coordination: Coordination normal.     Gait: Gait normal.     Deep Tendon Reflexes: Reflexes normal.  Psychiatric:        Mood and Affect: Mood normal.        Behavior: Behavior normal.    BP 140/64 (BP Location: Left Arm, Patient Position: Sitting, Cuff Size: Normal)    Pulse 60    Temp 97.9 F (36.6 C) (Oral)    Ht 6\' 3"  (1.905 m)    Wt 171 lb 12.8 oz (77.9 kg)    SpO2 99%    BMI 21.47 kg/m  Wt Readings from Last 3 Encounters:  02/24/21 171 lb 12.8 oz (77.9 kg)  02/15/21 168 lb (76.2 kg)  01/17/21 168 lb 9.6 oz (76.5 kg)    Health Maintenance Due  Topic Date Due   Zoster Vaccines- Shingrix (2  of 2) 05/07/2017    There are no preventive care reminders to display for this patient.   Lab Results  Component Value Date   TSH 1.55 01/17/2021   Lab Results  Component Value Date   WBC 5.0 01/17/2021   HGB 14.9 01/17/2021   HCT 43.8 01/17/2021   MCV 94.3 01/17/2021   PLT 243.0 01/17/2021   Lab Results  Component Value Date   NA 138 02/11/2021   K 4.3 02/11/2021   CO2 29 02/11/2021   GLUCOSE 85 02/11/2021   BUN 26 (H) 02/11/2021   CREATININE 1.34 02/11/2021   BILITOT 1.4 (H) 02/11/2021   ALKPHOS 43 02/11/2021   AST 24 02/11/2021   ALT 18 02/11/2021   PROT 6.8 02/11/2021   ALBUMIN 4.6 02/11/2021   CALCIUM 9.6 02/11/2021   ANIONGAP 8 01/03/2021   GFR 46.91 (L)  02/11/2021   Lab Results  Component Value Date   CHOL 197 09/21/2020   Lab Results  Component Value Date   HDL 62.00 09/21/2020   Lab Results  Component Value Date   LDLCALC 121 (H) 09/21/2020   Lab Results  Component Value Date   TRIG 70.0 09/21/2020   Lab Results  Component Value Date   CHOLHDL 3 09/21/2020   Lab Results  Component Value Date   HGBA1C 4.8 01/17/2021       Assessment & Plan:   Problem List Items Addressed This Visit   None Visit Diagnoses     Lumbar radiculopathy    -  Primary   Relevant Orders   DG Lumbar Spine Complete   Chronic low back pain with sciatica, sciatica laterality unspecified, unspecified back pain laterality       Relevant Medications   predniSONE (STERAPRED UNI-PAK 21 TAB) 10 MG (21) TBPK tablet   Other Relevant Orders   DG Lumbar Spine Complete        Meds ordered this encounter  Medications   predniSONE (STERAPRED UNI-PAK 21 TAB) 10 MG (21) TBPK tablet    Sig: AS DIRECTED    Dispense:  21 tablet    Refill:  0   Xrays obtained today will notify patient pending results. Prednisone prescribed to help with the pain. Call the office if symptoms worsen or persist. Recheck as scheduled and sooner as needed.   Kennyth Arnold, FNP

## 2021-03-01 DIAGNOSIS — M65332 Trigger finger, left middle finger: Secondary | ICD-10-CM | POA: Diagnosis not present

## 2021-03-07 ENCOUNTER — Encounter: Payer: Self-pay | Admitting: Internal Medicine

## 2021-03-07 DIAGNOSIS — E785 Hyperlipidemia, unspecified: Secondary | ICD-10-CM

## 2021-03-08 NOTE — Telephone Encounter (Signed)
Should I scheduled pt for a lab appt to have liver enzymes checked?

## 2021-03-09 MED ORDER — ROSUVASTATIN CALCIUM 10 MG PO TABS: 10.0000 mg | ORAL_TABLET | ORAL | 1 refills | Status: DC

## 2021-03-15 ENCOUNTER — Other Ambulatory Visit: Payer: Self-pay

## 2021-03-15 MED ORDER — ROSUVASTATIN CALCIUM 10 MG PO TABS: 10.0000 mg | ORAL_TABLET | Freq: Every day | ORAL | 1 refills | Status: DC

## 2021-03-16 ENCOUNTER — Telehealth: Payer: Self-pay | Admitting: Internal Medicine

## 2021-03-16 NOTE — Telephone Encounter (Signed)
After reviewing his records, I do not think that this is a pulmonary issue.  Particularly with the associated leg weakness.  It may be an issue with his heart or other issues.  I recommend that he make appointment with Dr. Derrel Nip and cardiology ASAP to have these things evaluated.  His lung function previously has been noted to be very good with only minimal obstruction.

## 2021-03-16 NOTE — Telephone Encounter (Signed)
Spoke to patient and relayed below recommendations. He voiced his understanding and had no further questions.  Nothing further needed at this time.

## 2021-03-16 NOTE — Telephone Encounter (Signed)
Spoke to patient.  Patient reports of increased sob with exertion and leg weakness x3w.  Sob worsened around 4:00 in the afternoon.  He is using duoneb once daily.  Denied cough, wheezing, f/c/s or additional sx.  Two ED visit in October for SOB.  Dr. Patsey Berthold, please advise. Thanks.

## 2021-03-21 ENCOUNTER — Ambulatory Visit (INDEPENDENT_AMBULATORY_CARE_PROVIDER_SITE_OTHER): Payer: PPO

## 2021-03-21 ENCOUNTER — Other Ambulatory Visit: Payer: Self-pay

## 2021-03-21 ENCOUNTER — Encounter (INDEPENDENT_AMBULATORY_CARE_PROVIDER_SITE_OTHER): Payer: Self-pay | Admitting: Vascular Surgery

## 2021-03-21 ENCOUNTER — Ambulatory Visit (INDEPENDENT_AMBULATORY_CARE_PROVIDER_SITE_OTHER): Payer: PPO | Admitting: Vascular Surgery

## 2021-03-21 VITALS — BP 112/70 | HR 65 | Ht 75.0 in | Wt 168.0 lb

## 2021-03-21 DIAGNOSIS — I25119 Atherosclerotic heart disease of native coronary artery with unspecified angina pectoris: Secondary | ICD-10-CM | POA: Diagnosis not present

## 2021-03-21 DIAGNOSIS — I739 Peripheral vascular disease, unspecified: Secondary | ICD-10-CM

## 2021-03-21 DIAGNOSIS — E785 Hyperlipidemia, unspecified: Secondary | ICD-10-CM | POA: Diagnosis not present

## 2021-03-21 DIAGNOSIS — J449 Chronic obstructive pulmonary disease, unspecified: Secondary | ICD-10-CM

## 2021-03-21 NOTE — Progress Notes (Signed)
MRN : [id number]  Theodore [name] is a 88 y.o. ([birth year]) male who presents with chief complaint of check circulation.  History of Present Illness:  The patient returns to the office for followup and review of the noninvasive studies. There have been no interval changes in lower extremity symptoms.   No interval development of rest pain symptoms. No new ulcers or wounds have occurred since the last visit.   Actually, his primary complaint today is a profound shortness of breath which limits his ability to walk even limited distances.  He notes this seems to occur episodically almost exclusively in the afternoon.  He has been seen twice in the ER but no significant changes to his medications have been made and he has not found any explanation to this point as to why this is occurring.  He notes that he is only able to go 50 feet which is the distance to his mailbox and must stop several times.   The patient denies amaurosis fugax or recent TIA symptoms. There are no recent neurological changes noted. The patient denies history of DVT, PE or superficial thrombophlebitis. The patient denies recent episodes of angina or shortness of breath.    ABI Rt=0.87 and Lt=0.68  (previous ABI Rt=0.0.83 and Lt=0.83) waveforms appear unchanged TBI's are unchanged Previous duplex ultrasound of the  Bilateral lower extremities arterial system shows normal femoral popliteal arteries with mixed tibial disease  Current Meds  Medication Sig   Alpha-Lipoic Acid 600 MG CAPS Take by mouth.   ascorbic Acid (VITAMIN C) 500 MG CPCR Take by mouth.   b complex vitamins tablet Take by mouth.   buPROPion (WELLBUTRIN XL) 150 MG 24 hr tablet TAKE 1 TABLET BY MOUTH EVERY DAY   Cholecalciferol 125 MCG (5000 UT) TABS Take by mouth.   Coenzyme Q10 100 MG capsule Take by mouth.   cycloSPORINE (RESTASIS) 0.05 % ophthalmic emulsion Restasis 0.05 % eye drops in a dropperette  INSTILL 1 DROP INTO BOTH EYES TWICE A DAY   donepezil  (ARICEPT) 5 MG tablet Take by mouth.   ezetimibe (ZETIA) 10 MG tablet Take 1 tablet (10 mg total) by mouth daily.   fluorometholone (FML) 0.1 % ophthalmic suspension Place 1 drop into the right eye 2 (two) times daily.   ipratropium-albuterol (DUONEB) 0.5-2.5 (3) MG/3ML SOLN Take 3 mLs by nebulization every 6 (six) hours as needed.   levothyroxine (SYNTHROID) 100 MCG tablet TAKE 1 TABLET BY MOUTH EVERY DAY BEFORE BREAKFAST   magnesium oxide (MAG-OX) 400 MG tablet Take 400 mg by mouth daily.   OVER THE COUNTER MEDICATION Take 4 tablets by mouth daily. Standard Process- Cardio Plus   OVER THE COUNTER MEDICATION Take 1 tablet by mouth daily. Cardio Platinum - Vitamin K-2 500 mg, Arginine 300 mg, Natokinase 100 mg   OVER THE COUNTER MEDICATION Take by mouth daily. Hemp oil   predniSONE (STERAPRED UNI-PAK 21 TAB) 10 MG (21) TBPK tablet AS DIRECTED   Probiotic Product (PROBIOTIC + TURMERIC EXTRACT) 400 MG CAPS Take by mouth.   Probiotic Product (PROBIOTIC ACIDOPHILUS) CAPS Take 1 capsule by mouth daily.   rosuvastatin (CRESTOR) 10 MG tablet Take 1 tablet (10 mg total) by mouth daily.   sertraline (ZOLOFT) 50 MG tablet TAKE 1 TABLET (50 MG TOTAL) BY MOUTH DAILY. WITH A MEAL.   testosterone cypionate (DEPOTESTOSTERONE CYPIONATE) 200 MG/ML injection INJECT 0.5 ML (100 MG TOTAL) INTO THE MUSCLE EVERY 14 DAYS.   Turmeric 400 MG CAPS Take by mouth.  Past Medical History:  Diagnosis Date   Arthritis    knees   Asymptomatic Sinus bradycardia    a. 05/2016 Zio: Avg HR 61 (41-167).   Benign essential tremor    Cancer ([place]) 2002   melanoma right ear   Colon polyp    COPD (chronic obstructive pulmonary disease) ([place])    pt said he believes it is a misdiagnosis    Coronary artery calcification seen on CT scan    a. 06/2016 CTA Chest: cor Ca2+; b. 05/2017 MV: EF >65%. No ischemia/infarct.   Depression    History of echocardiogram    a. 04/2016 Echo: EF 60-65%, mild conc LVH. Nl PASP.   History of kidney  stones    Hx of dysplastic nevus 05/07/2017   L anterior shoulder, severe   Hx of dysplastic nevus 09/06/2020   R lower sternum, moderate atypia   Hypothyroidism    Hypothyroidism    Melanoma ([place]) 1990's per pt   R ear   Melanoma in situ ([place]) 01/12/2014   left jaw   Palpitations    a. 05/2016 Zio: Avg HR 61 (41-167). 9 SVT runs (fastest 167 - 5 beats; longest 8 beats - 101 bpm). Rare PACs/PVCs.   PVC's (premature ventricular contractions)    Rhinitis    Wears dentures    partial upper and lower   Wears hearing aid in both ears     Past Surgical History:  Procedure Laterality Date   ADENOIDECTOMY     CATARACT EXTRACTION W/PHACO Left 10/22/2017   Procedure: CATARACT EXTRACTION PHACO AND INTRAOCULAR LENS PLACEMENT (IOC)  LEFT;  Surgeon: Eulogio Bear, MD;  Location: [facility];  Service: Ophthalmology;  Laterality: Left;   CATARACT EXTRACTION W/PHACO Right 11/20/2017   Procedure: CATARACT EXTRACTION PHACO AND INTRAOCULAR LENS PLACEMENT (IOC) RIGHT;  Surgeon: Eulogio Bear, MD;  Location: [facility];  Service: Ophthalmology;  Laterality: Right;   ESOPHAGOGASTRODUODENOSCOPY (EGD) WITH PROPOFOL N/A 10/31/2016   Procedure: ESOPHAGOGASTRODUODENOSCOPY (EGD) WITH PROPOFOL;  Surgeon: Lollie Sails, MD;  Location: [facility] ENDOSCOPY;  Service: Endoscopy;  Laterality: N/A;   JOINT REPLACEMENT Right 2003   knee   knee meniscus repair Right    MELANOMA EXCISION Right    ear. Followed by Dr. Nehemiah Massed   PATELLECTOMY Bilateral    TONSILLECTOMY      Social History Social History   Tobacco Use   Smoking status: Never   Smokeless tobacco: Never  Vaping Use   Vaping Use: Never used  Substance Use Topics   Alcohol use: Yes    Comment: 1 glass of wine daily   Drug use: No    Family History Family History  Problem Relation Age of Onset   Hypertension Mother    Heart disease Mother        CHF   Heart disease Father    Cancer Sister        melanoma     Allergies  Allergen Reactions   Penicillins Rash and Other (See Comments)    Other reaction(s): Other (see comments) Other reaction(s): UNKNOWN   Lexapro [Escitalopram] Other (See Comments)    Increased hand tremor     REVIEW OF SYSTEMS (Negative unless checked)  Constitutional: [] Weight loss  [] Fever  [] Chills Cardiac: [] Chest pain   [] Chest pressure   [] Palpitations   [] Shortness of breath when laying flat   [x] Shortness of breath with exertion. Vascular:  [x] Pain in legs with walking   [] Pain in legs at rest  [] History  of DVT   [] Phlebitis   [] Swelling in legs   [] Varicose veins   [] Non-healing ulcers Pulmonary:   [] Uses home oxygen   [] Productive cough   [] Hemoptysis   [] Wheeze  [x] COPD   [] Asthma Neurologic:  [] Dizziness   [] Seizures   [] History of stroke   [] History of TIA  [] Aphasia   [] Vissual changes   [] Weakness or numbness in arm   [x] Weakness or numbness in leg Musculoskeletal:   [] Joint swelling   [] Joint pain   [] Low back pain Hematologic:  [] Easy bruising  [] Easy bleeding   [] Hypercoagulable state   [] Anemic Gastrointestinal:  [] Diarrhea   [] Vomiting  [] Gastroesophageal reflux/heartburn   [] Difficulty swallowing. Genitourinary:  [] Chronic kidney disease   [] Difficult urination  [] Frequent urination   [] Blood in urine Skin:  [] Rashes   [] Ulcers  Psychological:  [] History of anxiety   []  History of major depression.  Physical Examination  Vitals:   03/21/21 0918  BP: 112/70  Pulse: 65  Weight: 168 lb (76.2 kg)  Height: 6\' 3"  (1.905 m)   Body mass index is 21 kg/m. Gen: WD/WN, NAD Head: [place]/AT, No temporalis wasting.  Ear/Nose/Throat: Hearing grossly intact, nares w/o erythema or drainage Eyes: PER, EOMI, sclera nonicteric.  Neck: Supple, no masses.  No bruit or JVD.  Pulmonary: Somewhat limited air movement, no audible wheezing, no use of accessory muscles.  Cardiac: RRR, normal S1, S2, no Murmurs. Vascular:   Vessel Right Left  Radial Palpable Palpable   Carotid Palpable Palpable  PT Not palpable Not palpable  DP Not palpable Not palpable  Gastrointestinal: soft, non-distended. No guarding/no peritoneal signs.  Musculoskeletal: M/S 5/5 throughout.  No visible deformity.  Neurologic: CN 2-12 intact. Pain and light touch intact in extremities.  Symmetrical.  Speech is fluent. Motor exam as listed above. Psychiatric: Judgment intact, Mood & affect appropriate for pt's clinical situation. Dermatologic: No rashes or ulcers noted.  No changes consistent with cellulitis.   CBC Lab Results  Component Value Date   WBC 5.0 01/17/2021   HGB 14.9 01/17/2021   HCT 43.8 01/17/2021   MCV 94.3 01/17/2021   PLT 243.0 01/17/2021    BMET    Component Value Date/Time   NA 138 02/11/2021 0949   K 4.3 02/11/2021 0949   CL 100 02/11/2021 0949   CO2 29 02/11/2021 0949   GLUCOSE 85 02/11/2021 0949   BUN 26 (H) 02/11/2021 0949   CREATININE 1.34 02/11/2021 0949   CREATININE 0.92 02/03/2015 1504   CALCIUM 9.6 02/11/2021 0949   GFRNONAA 50 (L) 01/03/2021 1327   GFRNONAA 77 02/03/2015 1504   GFRAA >60 05/10/2016 0236   GFRAA 89 02/03/2015 1504   CrCl cannot be calculated (Patient's most recent lab result is older than the maximum 21 days allowed.).  COAG No results found for: INR, PROTIME  Radiology DG Lumbar Spine Complete  Result Date: 02/25/2021 CLINICAL DATA:  Low back pain, no known injury, initial encounter EXAM: LUMBAR SPINE - COMPLETE 4+ VIEW COMPARISON:  None. FINDINGS: Five lumbar type vertebral bodies are well visualized. Vertebral body height is well maintained. Mild scoliosis concave to the right is noted of a degenerative nature. Osteophytic changes and facet hypertrophic changes are noted. Mild degenerative retrolisthesis of L4 on L5 is noted. No soft tissue abnormality is seen. IMPRESSION: Degenerative change without acute abnormality. Electronically Signed   By: Inez Catalina M.D.   On: 02/25/2021 23:02     Assessment/Plan 1.  PAD (peripheral artery disease) ([place])  Recommend:  The  patient has evidence of atherosclerosis of the lower extremities with claudication.  The patient does not voice lifestyle limiting changes at this point in time.  Noninvasive studies do not suggest clinically significant change.  No invasive studies, angiography or surgery at this time The patient should continue walking and begin a more formal exercise program.  The patient should continue antiplatelet therapy and aggressive treatment of the lipid abnormalities  No changes in the patient's medications at this time  The patient should continue wearing graduated compression socks 10-15 mmHg strength to control the mild edema.   - VAS [place] ABI WITH/WO TBI; Future  2. Coronary artery disease involving native coronary artery of native heart with angina pectoris ([place]) Continue cardiac and antihypertensive medications as already ordered and reviewed, no changes at this time.  Continue statin as ordered and reviewed, no changes at this time  Nitrates PRN for chest pain   3. Chronic obstructive pulmonary disease, unspecified COPD type ([place]) Continue pulmonary medications and aerosols as already ordered, these medications have been reviewed and there are no changes at this time.  Given his primary complaint I will secure chat Dr. Mortimer Fries to see if he could arrange follow-up with Mr. Ahles    4. Hyperlipidemia LDL goal <100 Continue statin as ordered and reviewed, no changes at this time     Hortencia Pilar, MD  03/21/2021 10:03 AM

## 2021-03-22 ENCOUNTER — Encounter (INDEPENDENT_AMBULATORY_CARE_PROVIDER_SITE_OTHER): Payer: Self-pay | Admitting: Vascular Surgery

## 2021-03-27 ENCOUNTER — Other Ambulatory Visit: Payer: Self-pay | Admitting: Internal Medicine

## 2021-03-28 ENCOUNTER — Encounter: Payer: Self-pay | Admitting: Dermatology

## 2021-03-28 ENCOUNTER — Other Ambulatory Visit: Payer: Self-pay

## 2021-03-28 ENCOUNTER — Ambulatory Visit: Payer: PPO | Admitting: Dermatology

## 2021-03-28 DIAGNOSIS — Z85820 Personal history of malignant melanoma of skin: Secondary | ICD-10-CM

## 2021-03-28 DIAGNOSIS — L821 Other seborrheic keratosis: Secondary | ICD-10-CM | POA: Diagnosis not present

## 2021-03-28 DIAGNOSIS — Z86006 Personal history of melanoma in-situ: Secondary | ICD-10-CM

## 2021-03-28 DIAGNOSIS — D229 Melanocytic nevi, unspecified: Secondary | ICD-10-CM

## 2021-03-28 DIAGNOSIS — Z1283 Encounter for screening for malignant neoplasm of skin: Secondary | ICD-10-CM | POA: Diagnosis not present

## 2021-03-28 DIAGNOSIS — L578 Other skin changes due to chronic exposure to nonionizing radiation: Secondary | ICD-10-CM

## 2021-03-28 DIAGNOSIS — L820 Inflamed seborrheic keratosis: Secondary | ICD-10-CM | POA: Diagnosis not present

## 2021-03-28 DIAGNOSIS — L570 Actinic keratosis: Secondary | ICD-10-CM | POA: Diagnosis not present

## 2021-03-28 DIAGNOSIS — Z86018 Personal history of other benign neoplasm: Secondary | ICD-10-CM

## 2021-03-28 DIAGNOSIS — D1800 Hemangioma unspecified site: Secondary | ICD-10-CM | POA: Diagnosis not present

## 2021-03-28 DIAGNOSIS — L814 Other melanin hyperpigmentation: Secondary | ICD-10-CM

## 2021-03-28 NOTE — Progress Notes (Signed)
Follow-Up Visit   Subjective  Theodore [name] is a 88 y.o. male who presents for the following: Upper body skin exam (Hx of dysplastic nevus, R lower sternum, L ant shoulder) and check spot (R scalp, wife noticed).  The patient presents for Upper Body Skin Exam (UBSE) for skin cancer screening and mole check.  The patient has spots, moles and lesions to be evaluated, some may be new or changing and the patient has concerns that these could be cancer.   The following portions of the chart were reviewed this encounter and updated as appropriate:       Review of Systems:  No other skin or systemic complaints except as noted in HPI or Assessment and Plan.  Objective  Well appearing patient in no apparent distress; mood and affect are within normal limits.  All skin waist up examined.  R occipital scalp x 1, R flank x 1 (2) Stuck on waxy dark brown papule with irregular pigment (scalp) Keratotic pink papule R flank  Right Ear Well healed scar with no evidence of recurrence  L jaw Well healed scar with no evidence of recurrence   Scalp 3.0 x 2.5cm speckled brown patch  frontal scalp x 8, L ear x 2 (10) Pink keratotic macules     Assessment & Plan   Lentigines - Scattered tan macules - Due to sun exposure - Benign-appearing, observe - Recommend daily broad spectrum sunscreen SPF 30+ to sun-exposed areas, reapply every 2 hours as needed. - Call for any changes  Seborrheic Keratoses - Stuck-on, waxy, tan-brown papules and/or plaques  - Benign-appearing - Discussed benign etiology and prognosis. - Observe - Call for any changes - trunk  Melanocytic Nevi - Tan-brown and/or pink-flesh-colored symmetric macules and papules - Benign appearing on exam today - Observation - Call clinic for new or changing moles - Recommend daily use of broad spectrum spf 30+ sunscreen to sun-exposed areas.  - trunk  Hemangiomas - Red papules - Discussed benign nature - Observe -  Call for any changes - trunk  Actinic Damage - Chronic condition, secondary to cumulative UV/sun exposure - diffuse scaly erythematous macules with underlying dyspigmentation - Recommend daily broad spectrum sunscreen SPF 30+ to sun-exposed areas, reapply every 2 hours as needed.  - Staying in the shade or wearing long sleeves, sun glasses (UVA+UVB protection) and wide brim hats (4-inch brim around the entire circumference of the hat) are also recommended for sun protection.  - Call for new or changing lesions.  Skin cancer screening performed today.   Inflamed seborrheic keratosis (2) R occipital scalp x 1, R flank x 1  Destruction of lesion - R occipital scalp x 1, R flank x 1  Destruction method: cryotherapy   Informed consent: discussed and consent obtained   Lesion destroyed using liquid nitrogen: Yes   Region frozen until ice ball extended beyond lesion: Yes   Outcome: patient tolerated procedure well with no complications   Post-procedure details: wound care instructions given   Additional details:  Prior to procedure, discussed risks of blister formation, small wound, skin dyspigmentation, or rare scar following cryotherapy. Recommend Vaseline ointment to treated areas while healing.   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 Observe for recurrence. Call clinic for new or changing lesions.  Recommend regular skin exams, daily broad-spectrum spf 30+ sunscreen use, and photoprotection.    History of melanoma in situ L jaw  2015 Clear. Observe for recurrence. Call clinic for new or changing lesions.  Recommend regular skin exams, daily broad-spectrum spf 30+ sunscreen use, and photoprotection.    Seborrheic keratosis Scalp  Bx proven, stable compared to photo  Benign-appearing.  Observation.  Call clinic for new or changing lesions.  Recommend daily use of broad spectrum spf 30+ sunscreen to sun-exposed areas.    AK (actinic keratosis) (10) frontal scalp x 8, L  ear x 2  Actinic keratoses are precancerous spots that appear secondary to cumulative UV radiation exposure/sun exposure over time. They are chronic with expected duration over 1 year. A portion of actinic keratoses will progress to squamous cell carcinoma of the skin. It is not possible to reliably predict which spots will progress to skin cancer and so treatment is recommended to prevent development of skin cancer.  Recommend daily broad spectrum sunscreen SPF 30+ to sun-exposed areas, reapply every 2 hours as needed.  Recommend staying in the shade or wearing long sleeves, sun glasses (UVA+UVB protection) and wide brim hats (4-inch brim around the entire circumference of the hat). Call for new or changing lesions.   Destruction of lesion - frontal scalp x 8, L ear x 2  Destruction method: cryotherapy   Informed consent: discussed and consent obtained   Lesion destroyed using liquid nitrogen: Yes   Region frozen until ice ball extended beyond lesion: Yes   Outcome: patient tolerated procedure well with no complications   Post-procedure details: wound care instructions given   Additional details:  Prior to procedure, discussed risks of blister formation, small wound, skin dyspigmentation, or rare scar following cryotherapy. Recommend Vaseline ointment to treated areas while healing.    History of Dysplastic Nevi - No evidence of recurrence today - Recommend regular full body skin exams - Recommend daily broad spectrum sunscreen SPF 30+ to sun-exposed areas, reapply every 2 hours as needed.  - Call if any new or changing lesions are noted between office visits  - R lower sternum, L ant shoulder  Return in about 6 months (around 09/25/2021) for  recheck ISK R occipital scalp on f/u, UBSE, Hx of Melanoma, Hx of Dysplastic nevi, Hx of AKs.  I, Othelia Pulling, RMA, am acting as scribe for [name], MD .  Documentation: I have reviewed the above documentation for accuracy and completeness,  and I agree with the above.  [name] MD

## 2021-03-28 NOTE — Patient Instructions (Addendum)
If You Need Anything After Your Visit ° °If you have any questions or concerns for your doctor, please call our main line at [phone] and press option 4 to reach your doctor's medical assistant. If no one answers, please leave a voicemail as directed and we will return your call as soon as possible. Messages left after 4 pm will be answered the following business day.  ° °You may also send us a message via MyChart. We typically respond to MyChart messages within 1-2 business days. ° °For prescription refills, please ask your pharmacy to contact our office. Our fax number is [phone]. ° °If you have an urgent issue when the clinic is closed that cannot wait until the next business day, you can page your doctor at the number below.   ° °Please note that while we do our best to be available for urgent issues outside of office hours, we are not available 24/7.  ° °If you have an urgent issue and are unable to reach us, you may choose to seek medical care at your doctor's office, retail clinic, urgent care center, or emergency room. ° °If you have a medical emergency, please immediately call 911 or go to the emergency department. ° °Pager Numbers ° °- Dr. Kowalski: [phone] ° °- Dr. Moye: [phone] ° °- Dr. Stewart: [phone] ° °In the event of inclement weather, please call our main line at [phone] for an update on the status of any delays or closures. ° °Dermatology Medication Tips: °Please keep the boxes that topical medications come in in order to help keep track of the instructions about where and how to use these. Pharmacies typically print the medication instructions only on the boxes and not directly on the medication tubes.  ° °If your medication is too expensive, please contact our office at [phone] option 4 or send us a message through MyChart.  ° °We are unable to tell what your co-pay for medications will be in advance as this is different depending on your insurance coverage.  However, we may be able to find a substitute medication at lower cost or fill out paperwork to get insurance to cover a needed medication.  ° °If a prior authorization is required to get your medication covered by your insurance company, please allow us 1-2 business days to complete this process. ° °Drug prices often vary depending on where the prescription is filled and some pharmacies may offer cheaper prices. ° °The website www.goodrx.com contains coupons for medications through different pharmacies. The prices here do not account for what the cost may be with help from insurance (it may be cheaper with your insurance), but the website can give you the price if you did not use any insurance.  °- You can print the associated coupon and take it with your prescription to the pharmacy.  °- You may also stop by our office during regular business hours and pick up a GoodRx coupon card.  °- If you need your prescription sent electronically to a different pharmacy, notify our office through [facility] MyChart or by phone at [phone] option 4. ° ° ° ° °Si Usted Necesita Algo Después de Su Visita ° °También puede enviarnos un mensaje a través de MyChart. Por lo general respondemos a los mensajes de MyChart en el transcurso de 1 a 2 días hábiles. ° °Para renovar recetas, por favor pida a su farmacia que se ponga en contacto con nuestra oficina. Nuestro número de fax es el [phone]. ° °Si tiene   un asunto urgente cuando la clnica est cerrada y que no puede esperar hasta el siguiente da hbil, puede llamar/localizar a su doctor(a) al nmero que aparece a continuacin.   Por favor, tenga en cuenta que aunque hacemos todo lo posible para estar disponibles para asuntos urgentes fuera del horario de [place], no estamos disponibles las 24 horas del da, los 7 das de la [place].   Si tiene un problema urgente y no puede comunicarse con nosotros, puede optar por buscar atencin mdica  en el consultorio de su  doctor(a), en una clnica privada, en un centro de atencin urgente o en una sala de emergencias.  Si tiene Engineering geologist, por favor llame inmediatamente al 911 o vaya a la sala de emergencias.  Nmeros de bper  - Dr. Nehemiah Massed: [phone]  - Dra. Moye: [phone]  - Dra. Nicole Kindred: [phone]  En caso de inclemencias del [place], por favor llame a [place] Kindred principal al [phone] para una actualizacin sobre el [place] de cualquier retraso o cierre.  Consejos para la medicacin en dermatologa: Por favor, guarde las cajas en las que vienen los medicamentos de uso tpico para ayudarle a seguir las instrucciones sobre dnde y cmo usarlos. Las farmacias generalmente imprimen las instrucciones del medicamento slo en las cajas y no directamente en los tubos del [place].   Si su medicamento es muy caro, por favor, pngase en contacto con Zigmund Daniel llamando al [phone] y presione la opcin 4 o envenos un mensaje a travs de Pharmacist, community.   No podemos decirle cul ser su copago por los medicamentos por adelantado ya que esto es diferente dependiendo de la cobertura de su seguro. Sin embargo, es posible que podamos encontrar un medicamento sustituto a Electrical engineer un formulario para que el seguro cubra el medicamento que se considera necesario.   Si se requiere una autorizacin previa para que su compaa de seguros [place] su medicamento, por favor permtanos de 1 a 2 das hbiles para completar este proceso.  Los precios de los medicamentos varan con frecuencia dependiendo del Environmental consultant de dnde se surte la receta y alguna farmacias pueden ofrecer precios ms baratos.  El sitio web www.goodrx.com tiene cupones para medicamentos de Airline pilot. Los precios aqu no tienen en cuenta lo que podra costar con la ayuda del seguro (puede ser ms barato con su seguro), pero el sitio web puede darle el precio si no utiliz Research scientist (physical sciences).  - Puede imprimir el cupn  correspondiente y llevarlo con su receta a la farmacia.  - Tambin puede pasar por nuestra oficina durante el horario de atencin regular y Charity fundraiser una tarjeta de cupones de GoodRx.  - Si necesita que su receta se enve electrnicamente a una farmacia diferente, informe a nuestra oficina a travs de MyChart de [place] o por telfono llamando al [phone] y presione la opcin 4.   Cryotherapy Aftercare  Wash gently with soap and water everyday.   Apply Vaseline and Band-Aid daily until healed.  Seborrheic Keratosis  What causes seborrheic keratoses? Seborrheic keratoses are harmless, common skin growths that first appear during adult life.  As time goes by, more growths appear.  Some people may develop a large number of them.  Seborrheic keratoses appear on both covered and uncovered body parts.  They are not caused by sunlight.  The tendency to develop seborrheic keratoses can be inherited.  They vary in color from skin-colored to gray, brown, or even black.  They can be either smooth or have  a rough, warty surface.   Seborrheic keratoses are superficial and look as if they were stuck on the skin.  Under the microscope this type of keratosis looks like layers upon layers of skin.  That is why at times the top layer may seem to fall off, but the rest of the growth remains and re-grows.    Treatment Seborrheic keratoses do not need to be treated, but can easily be removed in the office.  Seborrheic keratoses often cause symptoms when they rub on clothing or jewelry.  Lesions can be in the way of shaving.  If they become inflamed, they can cause itching, soreness, or burning.  Removal of a seborrheic keratosis can be accomplished by freezing, burning, or surgery. If any spot bleeds, scabs, or grows rapidly, please return to have it checked, as these can be an indication of a skin cancer.

## 2021-03-31 ENCOUNTER — Ambulatory Visit: Payer: PPO | Admitting: Internal Medicine

## 2021-03-31 ENCOUNTER — Other Ambulatory Visit: Payer: Self-pay

## 2021-03-31 ENCOUNTER — Encounter: Payer: Self-pay | Admitting: Internal Medicine

## 2021-03-31 VITALS — BP 122/72 | HR 69 | Temp 97.8°F | Ht 75.0 in | Wt 168.6 lb

## 2021-03-31 DIAGNOSIS — J449 Chronic obstructive pulmonary disease, unspecified: Secondary | ICD-10-CM

## 2021-03-31 DIAGNOSIS — G250 Essential tremor: Secondary | ICD-10-CM | POA: Diagnosis not present

## 2021-03-31 DIAGNOSIS — M6281 Muscle weakness (generalized): Secondary | ICD-10-CM | POA: Diagnosis not present

## 2021-03-31 NOTE — Progress Notes (Signed)
Name: Theodore [name] MRN: [id number] DOB: 1930-02-18     CONSULTATION DATE:  03/31/2021   SYNOPSIS established care for COPD Patient uses his own NEB therapy as needed He takes electrodes and uses distilled water and mixes in [facility] and uses this in his own nebulizer He calls this concoction Colloidal Silver Nebulizers  He uses this NEB therapy as   Needed and helps a lot with chest congestion and cough This seems to be a form of Saline NEBS  Has PAD follows Vasc surgery He takes OTC EDTA -patient feels that this has helped his vascualr issues    CHIEF COMPLAINT:  Follow-up COPD Follow-up shortness of breath  HISTORY OF PRESENT ILLNESS:  Increase shortness of breath and dyspnea on exertion Has extreme muscle weakness in the lower extremities Inability to take effective breaths  No exacerbation at this time No evidence of heart failure at this time No evidence or signs of infection at this time No respiratory distress No fevers, chills, nausea, vomiting, diarrhea No evidence of lower extremity edema No evidence hemoptysis  Has seen cardiology in the past month and has no significant issues  Has had multiple ER visits with no definitive diagnosis with his shortness of breath I am very concerned that patient has had progressive muscle weakness which is also affecting his respiratory muscles as well  Review of Systems:  Gen: in distress HEENT: Denies blurred vision, double vision, ear pain, eye pain, hearing loss, nose bleeds, sore throat Cardiac:  No dizziness, chest pain or heaviness, chest tightness,edema, No JVD Resp:   + cough, +sputum production, +shortness of breath,-wheezing, -hemoptysis,  Other:  All other systems negative    BP 122/72 (BP Location: Left Arm, Cuff Size: Normal)    Pulse 69    Temp 97.8 F (36.6 C) (Temporal)    Ht 6\' 3"  (1.905 m)    Wt 168 lb 9.6 oz (76.5 kg)    SpO2 97%    BMI 21.07 kg/m     Physical Examination:   General  Appearance: +distress  EYES PERRLA, EOM intact.   NECK Supple, No JVD Pulmonary: normal breath sounds, No wheezing.  CardiovascularNormal S1,S2.  No m/r/g.   ALL OTHER ROS ARE NEGATIVE    Chest x-ray February 2022 Personally reviewed today No significant findings No pneumonia no active disease  CT chest 2018 No significant lung findings seen    ASSESSMENT / PLAN:  88 year old pleasant white male seen today for chronic intermittent shortness of breath and dyspnea exertion with underlying COPD Gold stage a which is very mild in the setting of increased shortness of breath and dyspnea on exertion over the last several months which seems to be related to a progressive muscle weakness of his lower extremities and is concerning for respiratory muscle fatigue with inability to take effective breaths with respiratory insufficiency   I am concerned there is a neuromuscular degenerative disease  I would like to assess for blood clots however his kidney function and GFR are decreased therefore we will obtain VQ scan to assess for PE as well as get a CT of his chest to assess for lung disease      MEDICATION ADJUSTMENTS/LABS AND TESTS ORDERED: Obtain CT of the chest to assess shortness of breath Obtain VQ scan to assess for PE Obtain pulmonary function test to assess for negative inspiratory force  Will need to see neurologist soon as possible  CURRENT MEDICATIONS REVIEWED AT LENGTH WITH PATIENT TODAY   Patient  satisfied with Plan of action and management. All questions answered  Follow up in 3 months  Total time spent 42 minutes   Corrin Parker, M.D.  [facility]  Medical Director [place] Director [facility] Cardio-Pulmonary Department

## 2021-03-31 NOTE — Progress Notes (Signed)
° °  Name: Theodore [name] MRN: [id number] DOB: 01/21/1930     CONSULTATION DATE:  03/31/2021   SYNOPSIS established care for COPD Patient uses his own NEB therapy as needed He takes electrodes and uses distilled water and mixes in [facility] and uses this in his own nebulizer He calls this concoction Colloidal Silver Nebulizers  He uses this NEB therapy as   Needed and helps a lot with chest congestion and cough This seems to be a form of Saline NEBS  Has PAD follows Vasc surgery He takes OTC EDTA -patient feels that this has helped his vascualr issues    CHIEF COMPLAINT:  Follow-up COPD  HISTORY OF PRESENT ILLNESS:   Patient not feeling well Increased shortness of breath   has chest pressure intermittently  Has a chronic productive cough  Sinus congestion   Will treat as COPD exacerbation with atypical pneumonia  I have contacted cardiology to assess chest pressure      Review of Systems:  Gen: in distress HEENT: Denies blurred vision, double vision, ear pain, eye pain, hearing loss, nose bleeds, sore throat Cardiac:  No dizziness, chest pain or heaviness, chest tightness,edema, No JVD Resp:   + cough, +sputum production, +shortness of breath,-wheezing, -hemoptysis,  Other:  All other systems negative    BP 122/72 (BP Location: Left Arm, Cuff Size: Normal)    Pulse 69    Temp 97.8 F (36.6 C) (Temporal)    Ht 6\' 3"  (1.905 m)    Wt 168 lb 9.6 oz (76.5 kg)    SpO2 97%    BMI 21.07 kg/m     Physical Examination:   General Appearance: +distress  EYES PERRLA, EOM intact.   NECK Supple, No JVD Pulmonary: normal breath sounds, No wheezing.  CardiovascularNormal S1,S2.  No m/r/g.   Abdomen: Benign, Soft, non-tender. Skin:   warm, no rashes, no ecchymosis  Extremities: normal, no cyanosis, clubbing. Neuro:without focal findings,  speech normal  PSYCHIATRIC: Mood, affect within normal limits.   ALL OTHER ROS ARE NEGATIVE  Chest x-ray February 2022 Personally  reviewed today No significant findings No pneumonia no active disease  CT chest 2018 No significant lung findings seen    ASSESSMENT / PLAN:  88 year old pleasant white male seen today for chronic intermittent shortness of breath and dyspnea exertion related to COPD Gold stage a with allergic rhinitis and reflux disease   At this time patient seems to have a mild COPD exacerbation likely from atypical pneumonia however with concerning his intermittent chest pressure he has been having for the last month  Will treat as COPD exacerbation with prednisone and antibiotics at this time Continue albuterol as needed  Allergic rhinitis with postnasal drip Start Zyrtec and Flonase  Chest pressure intermittent Follow-up with cardiology as soon as possible      MEDICATION ADJUSTMENTS/LABS AND TESTS ORDERED: Recommend Cardiology assessment for chest pressure  Start pred 20 mg daily for 10 days Start Z pak Start Zyrtec and Flonase  Obtain CXR 2 view   CURRENT MEDICATIONS REVIEWED AT LENGTH WITH PATIENT TODAY   Patient satisfied with Plan of action and management. All questions answered  Follow up in 3 months Total time spent 25 minutes   Maretta Bees Patricia Pesa, M.D.  [facility]  Medical Director [place] Director [facility] Cardio-Pulmonary Department

## 2021-03-31 NOTE — Patient Instructions (Addendum)
Obtain CT of the chest to assess shortness of breath Obtain VQ scan to assess for PE Obtain pulmonary function test to assess for negative inspiratory force  Will need to see neurologist soon as possible

## 2021-04-05 ENCOUNTER — Telehealth: Payer: Self-pay | Admitting: Internal Medicine

## 2021-04-05 ENCOUNTER — Other Ambulatory Visit
Admission: RE | Admit: 2021-04-05 | Discharge: 2021-04-05 | Disposition: A | Discharge status: Home / Self Care | Payer: PPO | Source: Ambulatory Visit | Attending: Internal Medicine | Admitting: Internal Medicine

## 2021-04-05 ENCOUNTER — Other Ambulatory Visit: Payer: Self-pay

## 2021-04-05 ENCOUNTER — Other Ambulatory Visit: Payer: PPO

## 2021-04-05 DIAGNOSIS — Z01812 Encounter for preprocedural laboratory examination: Secondary | ICD-10-CM | POA: Diagnosis not present

## 2021-04-05 DIAGNOSIS — Z20822 Contact with and (suspected) exposure to covid-19: Secondary | ICD-10-CM | POA: Diagnosis not present

## 2021-04-05 DIAGNOSIS — Z1152 Encounter for screening for COVID-19: Secondary | ICD-10-CM

## 2021-04-05 NOTE — Telephone Encounter (Signed)
[employer] called in regards to pts medication. Pt is needing prior authorization for the medication  testosterone cypionate (DEPOTESTOSTERONE CYPIONATE) 200 MG/ML injection   [employer]: [phone]  Fax: [phone]

## 2021-04-06 ENCOUNTER — Ambulatory Visit: Payer: PPO | Attending: Internal Medicine

## 2021-04-06 DIAGNOSIS — J449 Chronic obstructive pulmonary disease, unspecified: Secondary | ICD-10-CM | POA: Diagnosis not present

## 2021-04-06 DIAGNOSIS — M6281 Muscle weakness (generalized): Secondary | ICD-10-CM

## 2021-04-06 LAB — SARS CORONAVIRUS 2 (TAT 6-24 HRS): SARS Coronavirus 2: NEGATIVE

## 2021-04-06 MED ORDER — ALBUTEROL SULFATE (2.5 MG/3ML) 0.083% IN NEBU
2.5000 mg | INHALATION_SOLUTION | Freq: Once | RESPIRATORY_TRACT | Status: AC
  Administered 2021-04-06: 15:00:00 2.5 mg via RESPIRATORY_TRACT
  Filled 2021-04-06: qty 3

## 2021-04-07 NOTE — Telephone Encounter (Signed)
PA has been submitted through [place], awaiting a response.

## 2021-04-08 ENCOUNTER — Telehealth: Payer: Self-pay | Admitting: Internal Medicine

## 2021-04-08 DIAGNOSIS — R0602 Shortness of breath: Secondary | ICD-10-CM

## 2021-04-08 NOTE — Telephone Encounter (Signed)
CXR ordered. No contact number for nurse to make her aware.

## 2021-04-12 NOTE — Telephone Encounter (Signed)
PA has been approved through 03/12/2022. Pt is aware.

## 2021-04-18 DIAGNOSIS — M9903 Segmental and somatic dysfunction of lumbar region: Secondary | ICD-10-CM | POA: Diagnosis not present

## 2021-04-18 DIAGNOSIS — M5442 Lumbago with sciatica, left side: Secondary | ICD-10-CM | POA: Diagnosis not present

## 2021-04-19 ENCOUNTER — Ambulatory Visit
Admission: RE | Admit: 2021-04-19 | Discharge: 2021-04-19 | Disposition: A | Discharge status: Home / Self Care | Payer: PPO | Source: Ambulatory Visit | Attending: Internal Medicine | Admitting: Internal Medicine

## 2021-04-19 ENCOUNTER — Other Ambulatory Visit: Payer: Self-pay | Admitting: Physician Assistant

## 2021-04-19 ENCOUNTER — Other Ambulatory Visit: Payer: Self-pay

## 2021-04-19 DIAGNOSIS — M6281 Muscle weakness (generalized): Secondary | ICD-10-CM | POA: Diagnosis not present

## 2021-04-19 DIAGNOSIS — J449 Chronic obstructive pulmonary disease, unspecified: Secondary | ICD-10-CM | POA: Diagnosis not present

## 2021-04-19 DIAGNOSIS — R7989 Other specified abnormal findings of blood chemistry: Secondary | ICD-10-CM | POA: Diagnosis not present

## 2021-04-19 DIAGNOSIS — R0600 Dyspnea, unspecified: Secondary | ICD-10-CM | POA: Diagnosis not present

## 2021-04-19 DIAGNOSIS — G250 Essential tremor: Secondary | ICD-10-CM

## 2021-04-19 DIAGNOSIS — R0602 Shortness of breath: Secondary | ICD-10-CM

## 2021-04-19 DIAGNOSIS — R911 Solitary pulmonary nodule: Secondary | ICD-10-CM | POA: Diagnosis not present

## 2021-04-19 MED ORDER — TECHNETIUM TO 99M ALBUMIN AGGREGATED
4.0000 | Freq: Once | INTRAVENOUS | Status: AC | PRN
  Administered 2021-04-19: 4.48 via INTRAVENOUS

## 2021-04-20 ENCOUNTER — Ambulatory Visit
Admission: RE | Admit: 2021-04-20 | Discharge: 2021-04-20 | Disposition: A | Discharge status: Home / Self Care | Payer: PPO | Source: Ambulatory Visit | Attending: Physician Assistant | Admitting: Physician Assistant

## 2021-04-20 DIAGNOSIS — M9903 Segmental and somatic dysfunction of lumbar region: Secondary | ICD-10-CM | POA: Diagnosis not present

## 2021-04-20 DIAGNOSIS — M5442 Lumbago with sciatica, left side: Secondary | ICD-10-CM | POA: Diagnosis not present

## 2021-04-20 DIAGNOSIS — G319 Degenerative disease of nervous system, unspecified: Secondary | ICD-10-CM | POA: Diagnosis not present

## 2021-04-20 DIAGNOSIS — I6782 Cerebral ischemia: Secondary | ICD-10-CM | POA: Diagnosis not present

## 2021-04-20 DIAGNOSIS — G250 Essential tremor: Secondary | ICD-10-CM | POA: Diagnosis not present

## 2021-04-20 DIAGNOSIS — I619 Nontraumatic intracerebral hemorrhage, unspecified: Secondary | ICD-10-CM | POA: Diagnosis not present

## 2021-04-21 ENCOUNTER — Telehealth: Payer: Self-pay | Admitting: Internal Medicine

## 2021-04-21 NOTE — Telephone Encounter (Signed)
Placed in quick sign folder.  

## 2021-04-21 NOTE — Telephone Encounter (Signed)
Patient dropped off a letter for Dr Derrel Nip. Letter is up front in Dr Lupita Dawn color folder.

## 2021-04-22 NOTE — Telephone Encounter (Signed)
Patient wanted to make sure to see if DR Derrel Nip put order for MRI  , please call him @ [phone]

## 2021-04-25 ENCOUNTER — Telehealth: Payer: Self-pay | Admitting: Internal Medicine

## 2021-04-25 ENCOUNTER — Other Ambulatory Visit: Payer: Self-pay | Admitting: Internal Medicine

## 2021-04-25 DIAGNOSIS — R29898 Other symptoms and signs involving the musculoskeletal system: Secondary | ICD-10-CM

## 2021-04-25 DIAGNOSIS — M5416 Radiculopathy, lumbar region: Secondary | ICD-10-CM | POA: Insufficient documentation

## 2021-04-25 MED ORDER — TRAMADOL HCL 50 MG PO TABS
50.0000 mg | ORAL_TABLET | Freq: Four times a day (QID) | ORAL | 0 refills | Status: AC | PRN
Start: 2021-04-25 — End: 2021-05-02

## 2021-04-25 NOTE — Telephone Encounter (Signed)
Spoke with pt and scheduled him for next Wednesday at 11:30. Pt stated that he saw Theodore Quint, NP for his back and she did an xray. Pt is wanting to know if this will work to be able to get the MRI covered.

## 2021-04-25 NOTE — Assessment & Plan Note (Signed)
He has had persistent pain since mid December that has only worsened with chiropractic manipulations and home exercises.  He is noting weakness of both lower extremities and pain that is preventing him form standing for more than a few minutes at a time.  He has requested an MRI and analgesics prior to his scheduled appt with me in ten days.  MRI has been ordered and tramadol sent to pharmacy

## 2021-04-25 NOTE — Telephone Encounter (Signed)
Pt called stating he want something for pain. All he has is tylenol.

## 2021-04-25 NOTE — Telephone Encounter (Signed)
Spoke with pt to advise him of the medications that he can take. Pt stated that he very frustrated because he is scared to walk due to having impaired balance and pain on the left side that radiates down his leg. Pt stated that he spoke with his insurance company and they stated that all they need is an order for the MRI, PA not required. Pt is having to walk with a cane inside the home and if he goes outside he uses a rolator.

## 2021-04-25 NOTE — Telephone Encounter (Signed)
Based on the additional information provided by patient,  I have ordered the MRI lumbar spine and tramadol to use every 6 hours ( 4 times daily) . This can be added to the tylenol and aleve

## 2021-04-25 NOTE — Telephone Encounter (Signed)
Spoke with pt and informed him that the MRI has been ordered and Tramadol has been sent in for the pain. Pt gave a verbal understanding.

## 2021-04-25 NOTE — Telephone Encounter (Signed)
Pt is aware.  

## 2021-04-26 ENCOUNTER — Telehealth: Payer: Self-pay | Admitting: Internal Medicine

## 2021-04-26 NOTE — Telephone Encounter (Signed)
Patient dropped off a letter for Dr Derrel Nip. Letter is up front in Dr Lupita Dawn color folder.

## 2021-04-26 NOTE — Telephone Encounter (Signed)
Letter was shown to Dr. Derrel Nip and she asked that I call pt to see where [facility] [place] is located. [facility] is were he would like the MRI.

## 2021-04-26 NOTE — Telephone Encounter (Signed)
Spoke with Elvina Mattes and she stated that she would give them a call to schedule for the pt.

## 2021-04-27 ENCOUNTER — Ambulatory Visit
Admission: RE | Admit: 2021-04-27 | Discharge: 2021-04-27 | Disposition: A | Discharge status: Home / Self Care | Payer: PPO | Source: Ambulatory Visit | Attending: Internal Medicine | Admitting: Internal Medicine

## 2021-04-27 DIAGNOSIS — R29898 Other symptoms and signs involving the musculoskeletal system: Secondary | ICD-10-CM

## 2021-04-27 DIAGNOSIS — M4807 Spinal stenosis, lumbosacral region: Secondary | ICD-10-CM | POA: Diagnosis not present

## 2021-04-27 DIAGNOSIS — M48061 Spinal stenosis, lumbar region without neurogenic claudication: Secondary | ICD-10-CM | POA: Diagnosis not present

## 2021-04-27 DIAGNOSIS — M5416 Radiculopathy, lumbar region: Secondary | ICD-10-CM

## 2021-04-27 DIAGNOSIS — M545 Low back pain, unspecified: Secondary | ICD-10-CM | POA: Diagnosis not present

## 2021-04-30 ENCOUNTER — Encounter: Payer: Self-pay | Admitting: Internal Medicine

## 2021-05-02 ENCOUNTER — Encounter: Payer: Self-pay | Admitting: Family Medicine

## 2021-05-02 ENCOUNTER — Ambulatory Visit (INDEPENDENT_AMBULATORY_CARE_PROVIDER_SITE_OTHER): Payer: PPO | Admitting: Family Medicine

## 2021-05-02 VITALS — Temp 95.5°F

## 2021-05-02 DIAGNOSIS — J449 Chronic obstructive pulmonary disease, unspecified: Secondary | ICD-10-CM

## 2021-05-02 DIAGNOSIS — U071 COVID-19: Secondary | ICD-10-CM | POA: Diagnosis not present

## 2021-05-02 MED ORDER — BENZONATATE 100 MG PO CAPS
100.0000 mg | ORAL_CAPSULE | Freq: Two times a day (BID) | ORAL | 0 refills | Status: DC | PRN
Start: 2021-05-02 — End: 2021-05-04

## 2021-05-02 MED ORDER — MOLNUPIRAVIR EUA 200MG CAPSULE: 4.0000 | ORAL_CAPSULE | Freq: Two times a day (BID) | ORAL | 0 refills | Status: DC

## 2021-05-02 NOTE — Telephone Encounter (Signed)
I spoke with patient who stated that he tested positive on Saturday for Covid. He has not had fever but he does have cough, congestion & general weakness. Due to no availability here in office I have scheduled a telephone call appointment today with Dr. Volanda Napoleon & changes his Wednesday appointment with Dr. Derrel Nip to a telephone call as well.

## 2021-05-02 NOTE — Progress Notes (Signed)
Virtual Visit via Telephone Note  I connected with [name] on 05/02/21 at  3:00 PM EST by telephone and verified that I am speaking with the correct person using two identifiers.   I discussed the limitations, risks, security and privacy concerns of performing an evaluation and management service by telephone and the availability of in person appointments. I also discussed with the patient that there may be a patient responsible charge related to this service. The patient expressed understanding and agreed to proceed.  Location patient: home Location provider: work or home office Participants present for the call: patient, provider Patient did not have a visit in the prior 7 days to address this/these issue(s).  Chief Complaint  Patient presents with   Covid Positive    Tested positive Saturday    History of Present Illness: Pt is an 88 year old male with pmh sig for CAD, PAD, thoracic aortic atherosclerosis, asthma, COPD, dysphagia, hypothyroidism, hypogonadism, tremor, neuropathy,  h/o LE weakness, CKD 3, OSA, HLD who is followed by Dr. Derrel Nip and seen for acute concern.  Pt woke up Saturday with sore throat and congestion.  Had a positive COVID test on Saturday.  Feels weak, has SOB, and coughing.  ST improving.   Denies HAs, and/V, diarrhea, wheezing. Drinking more liquids, Wild cherry bark syrup with echanasia, Tussin DM.  Using Duoneb BID.   Pt lives in a retirement home.  States his wife has similar symptoms but has tested negative for COVID.   Observations/Objective: Patient sounds cheerful and well on the phone. I do not appreciate any SOB. Speech and thought processing are grossly intact. Patient reported vitals: Temp 95 F, 138/67, pO2 94%, Pulse 97  Assessment and Plan: COVID-19 virus infection  -symptoms starting on Saturday 04/30/21 with positive test 04/30/2021 -Continue OTC cough and cold medications, antihistamines, gargling with warm salt water or Chloraseptic  spray -Given history and severity of symptoms discussed r/b/a of antiviral medications.  Patient wishes to start molnupiravir. -Pt advised to go to ED for worsened SOB, however states he will not go in if things get worse 2/2 prolonged wait times during past visits. - Plan: molnupiravir EUA (LAGEVRIO) 200 mg CAPS capsule  Chronic obstructive pulmonary disease, unspecified COPD type ([place]) -Continue neb treatments -Given strict precautions -Follow-up with pulmonology   Follow Up Instructions: Follow-up as needed.  I did not refer this patient for an OV in the next 24 hours for this/these issue(s).  I discussed the assessment and treatment plan with the patient. The patient was provided an opportunity to ask questions and all were answered. The patient agreed with the plan and demonstrated an understanding of the instructions.   The patient was advised to call back or seek an in-person evaluation if the symptoms worsen or if the condition fails to improve as anticipated.  I provided 12 minutes of non-face-to-face time during this encounter.   Billie Ruddy, MD

## 2021-05-03 ENCOUNTER — Ambulatory Visit: Payer: PPO

## 2021-05-04 ENCOUNTER — Encounter: Payer: Self-pay | Admitting: Internal Medicine

## 2021-05-04 ENCOUNTER — Ambulatory Visit (INDEPENDENT_AMBULATORY_CARE_PROVIDER_SITE_OTHER): Payer: PPO | Admitting: Internal Medicine

## 2021-05-04 ENCOUNTER — Telehealth: Payer: Self-pay | Admitting: Internal Medicine

## 2021-05-04 VITALS — HR 60 | Ht 75.0 in | Wt 163.0 lb

## 2021-05-04 DIAGNOSIS — T50905A Adverse effect of unspecified drugs, medicaments and biological substances, initial encounter: Secondary | ICD-10-CM

## 2021-05-04 DIAGNOSIS — M5416 Radiculopathy, lumbar region: Secondary | ICD-10-CM | POA: Diagnosis not present

## 2021-05-04 DIAGNOSIS — J449 Chronic obstructive pulmonary disease, unspecified: Secondary | ICD-10-CM | POA: Diagnosis not present

## 2021-05-04 DIAGNOSIS — U071 COVID-19: Secondary | ICD-10-CM | POA: Diagnosis not present

## 2021-05-04 DIAGNOSIS — R29898 Other symptoms and signs involving the musculoskeletal system: Secondary | ICD-10-CM

## 2021-05-04 MED ORDER — PREDNISONE 10 MG PO TABS: ORAL_TABLET | ORAL | 0 refills | Status: DC

## 2021-05-04 MED ORDER — BREZTRI AEROSPHERE 160-9-4.8 MCG/ACT IN AERO: 2.0000 | INHALATION_SPRAY | Freq: Two times a day (BID) | RESPIRATORY_TRACT | 2 refills | Status: DC

## 2021-05-04 NOTE — Assessment & Plan Note (Signed)
Pruritic rash on neck and arm after starting molnupiravir and benzonatate.  Advised to stop both,  Use benadryl and delsym for cough,  Prednisone taper sent

## 2021-05-04 NOTE — Assessment & Plan Note (Addendum)
Diagnosed but currently untreated .  Sees [facility] in April.  Will start maintenance therapy now with Breztri.

## 2021-05-04 NOTE — Assessment & Plan Note (Addendum)
Likely secondary to lumbar radiculopathy .  PT ordered for eval and treat

## 2021-05-04 NOTE — Telephone Encounter (Signed)
Referral was not sent waiting on ofc notes to be completed.

## 2021-05-04 NOTE — Progress Notes (Signed)
Subjective:  Patient ID: Theodore [name], male    DOB: 07/28/1929  Age: 88 y.o. MRN: [id number]  CC: The primary encounter diagnosis was Lumbar radiculopathy. Diagnoses of Adverse drug reaction, initial encounter, Chronic obstructive pulmonary disease, unspecified COPD type ([place]), Weakness of both lower extremities, and COVID-19 virus infection were also pertinent to this visit.   This visit occurred during the SARS-CoV-2 public health emergency.  Safety protocols were in place, including screening questions prior to the visit, additional usage of staff PPE, and extensive cleaning of exam room while observing appropriate contact time as indicated for disinfecting solutions.    HPI KHAM ZUCKERMAN presents for evaluation and treatment of back pain , follow up on COVID infection diagnosed Feb 18,  follow  up on COPD diagnosed by Dr Mortimer Fries but not treated,  and new nonset rash Chief Complaint  Patient presents with   Discuss MRI    1) COVID:  diagnosed Feb 18th.  Symptoms mild,  increased sputum production causing cough,  and generalized weakness.  No fevers or dyspnea other than what he has been experiencing at baseline since October.  Taking molnupiraviar and benzonotate prescribed by other provider.    2 )  chronic intermittent low back pain that became constant in early December with radiation to left leg . Had plain films noting scoliosis , spurring and retrolisthesis of L4 on L5  in mid December.  Pain progressed and became constant several weeks ago,  aggravated by standing , lying and walking  and MRI was done last week which noted Diffuse lumbar disc and facet degeneration, most notable at L4-5 where there is moderate lateral recess stenosis and moderate to severe right and severe left neural foraminal stenosis. 2. Moderate right and severe left neural foraminal stenosis at L5-S1.  The pain has markedly resolved since her has been resting due to active COVID infection diagnosed on Feb 18 . Has  had 3 treatments b local chiropractor.   Worried about his progressive leg weakness and unsteady gait.  Pain was partiallyrelieved with tramadol,  since COVID infection he has been less active and having no pain .   3) Breathing problems:  had testing done by Dr Mortimer Fries on mid January  told he had COPD and to follow up in April; however.   no maintenance meds prescribed.  Wants to start treatment now.   Has a nebulizer  4)   Rash on back of neck , now spread to arm.  Started 24 hours ago. Pruritic .      Outpatient Medications Prior to Visit  Medication Sig Dispense Refill   Alpha-Lipoic Acid 600 MG CAPS Take by mouth.     ascorbic Acid (VITAMIN C) 500 MG CPCR Take by mouth.     buPROPion (WELLBUTRIN XL) 150 MG 24 hr tablet TAKE 1 TABLET BY MOUTH EVERY DAY 90 tablet 1   Cholecalciferol 125 MCG (5000 UT) TABS Take by mouth.     Coenzyme Q10 100 MG capsule Take by mouth.     cycloSPORINE (RESTASIS) 0.05 % ophthalmic emulsion Restasis 0.05 % eye drops in a dropperette  INSTILL 1 DROP INTO BOTH EYES TWICE A DAY     donepezil (ARICEPT) 5 MG tablet Take by mouth.     ezetimibe (ZETIA) 10 MG tablet Take 1 tablet (10 mg total) by mouth daily. 90 tablet 3   fluorometholone (FML) 0.1 % ophthalmic suspension Place 1 drop into the right eye 2 (two) times daily.  ipratropium-albuterol (DUONEB) 0.5-2.5 (3) MG/3ML SOLN Take 3 mLs by nebulization every 6 (six) hours as needed. 360 mL 0   levothyroxine (SYNTHROID) 100 MCG tablet TAKE 1 TABLET BY MOUTH EVERY DAY BEFORE BREAKFAST 90 tablet 0   magnesium oxide (MAG-OX) 400 MG tablet Take 400 mg by mouth daily.     OVER THE COUNTER MEDICATION Take 4 tablets by mouth daily. Standard Process- Cardio Plus     OVER THE COUNTER MEDICATION Take 1 tablet by mouth daily. Cardio Platinum - Vitamin K-2 500 mg, Arginine 300 mg, Natokinase 100 mg     OVER THE COUNTER MEDICATION Take by mouth daily. Hemp oil     Probiotic Product (PROBIOTIC + TURMERIC EXTRACT) 400 MG CAPS  Take by mouth.     Probiotic Product (PROBIOTIC ACIDOPHILUS) CAPS Take 1 capsule by mouth daily.     rosuvastatin (CRESTOR) 10 MG tablet Take 1 tablet (10 mg total) by mouth daily. 90 tablet 1   sertraline (ZOLOFT) 50 MG tablet TAKE 1 TABLET (50 MG TOTAL) BY MOUTH DAILY. WITH A MEAL. 90 tablet 2   testosterone cypionate (DEPOTESTOSTERONE CYPIONATE) 200 MG/ML injection INJECT 0.5 ML (100 MG TOTAL) INTO THE MUSCLE EVERY 14 DAYS. 1 mL 5   Turmeric 400 MG CAPS Take by mouth.     vitamin B-12 (CYANOCOBALAMIN) 500 MCG tablet Take 1 tablet by mouth daily.     benzonatate (TESSALON) 100 MG capsule Take 1 capsule (100 mg total) by mouth 2 (two) times daily as needed for cough. 20 capsule 0   molnupiravir EUA (LAGEVRIO) 200 mg CAPS capsule Take 4 capsules (800 mg total) by mouth 2 (two) times daily for 5 days. 40 capsule 0   b complex vitamins tablet Take by mouth. (Patient not taking: Reported on 05/04/2021)     No facility-administered medications prior to visit.    Review of Systems;  Patient denies headache, fevers, malaise, unintentional weight loss, skin rash, eye pain, sinus congestion and sinus pain, sore throat, dysphagia,  hemoptysis , cough, dyspnea, wheezing, chest pain, palpitations, orthopnea, edema, abdominal pain, nausea, melena, diarrhea, constipation, flank pain, dysuria, hematuria, urinary  Frequency, nocturia, numbness, tingling, seizures,  Focal weakness, Loss of consciousness,  Tremor, insomnia, depression, anxiety, and suicidal ideation.      Objective:  Pulse 60    Ht 6\' 3"  (1.905 m)    Wt 163 lb (73.9 kg)    SpO2 99%    BMI 20.37 kg/m   BP Readings from Last 3 Encounters:  03/31/21 122/72  03/21/21 112/70  02/24/21 140/64    Wt Readings from Last 3 Encounters:  05/04/21 163 lb (73.9 kg)  03/31/21 168 lb 9.6 oz (76.5 kg)  03/21/21 168 lb (76.2 kg)    General appearance: alert, cooperative and appears stated age Ears: normal TM's and external ear canals both  ears Throat: lips, mucosa, and tongue normal; teeth and gums normal Neck: no adenopathy, no carotid bruit, supple, symmetrical, trachea midline and thyroid not enlarged, symmetric, no tenderness/mass/nodules Back: symmetric, no curvature. ROM normal. No CVA tenderness. Lungs: clear to auscultation bilaterally Heart: regular rate and rhythm, S1, S2 normal, no murmur, click, rub or gallop Abdomen: soft, non-tender; bowel sounds normal; no masses,  no organomegaly Pulses: 2+ and symmetric Skin: skin on oap of neck and occiput erythematous and pebbly texture per motherSkin color, texture, turgor normal. Lymph nodes: Cervical, supraclavicular, and axillary nodes normal.  Lab Results  Component Value Date   HGBA1C 4.8 01/17/2021   HGBA1C 4.7  05/28/2018    Lab Results  Component Value Date   CREATININE 1.34 02/11/2021   CREATININE 1.30 01/17/2021   CREATININE 1.35 (H) 01/03/2021    Lab Results  Component Value Date   WBC 5.0 01/17/2021   HGB 14.9 01/17/2021   HCT 43.8 01/17/2021   PLT 243.0 01/17/2021   GLUCOSE 85 02/11/2021   CHOL 197 09/21/2020   TRIG 70.0 09/21/2020   HDL 62.00 09/21/2020   LDLCALC 121 (H) 09/21/2020   ALT 18 02/11/2021   AST 24 02/11/2021   NA 138 02/11/2021   K 4.3 02/11/2021   CL 100 02/11/2021   CREATININE 1.34 02/11/2021   BUN 26 (H) 02/11/2021   CO2 29 02/11/2021   TSH 1.55 01/17/2021   PSA 21.68 (H) 02/11/2021   HGBA1C 4.8 01/17/2021    MR Lumbar Spine Wo Contrast  Result Date: 04/27/2021 CLINICAL DATA:  Left lumbar radiculopathy. Weakness of both lower extremities. Low back pain radiating down the left leg. EXAM: MRI LUMBAR SPINE WITHOUT CONTRAST TECHNIQUE: Multiplanar, multisequence MR imaging of the lumbar spine was performed. No intravenous contrast was administered. COMPARISON:  Lumbar spine radiographs 02/24/2021 FINDINGS: Segmentation:  Standard. Alignment: Mild lumbar levoscoliosis. Trace retrolisthesis of L1 on L2, L2 on L3, L3 on L4, and  L4 on L5. Vertebrae: No fracture, suspicious marrow lesion, or significant marrow edema. Scattered small Schmorl's nodes and chronic degenerative endplate changes. Conus medullaris and cauda equina: Conus extends to the L1-2 level. Conus and cauda equina appear normal. Paraspinal and other soft tissues: Unremarkable. Disc levels: Disc desiccation throughout the lumbar spine. Moderate disc space narrowing from L2-3 to L4-5. T12-L1: Minimal disc bulging and mild facet hypertrophy without stenosis. L1-2: Mild disc bulging and mild-to-moderate facet and ligamentum flavum hypertrophy result in mild right lateral recess stenosis without spinal or neural foraminal stenosis. L2-3: Disc bulging and moderate to severe facet and ligamentum flavum hypertrophy result in mild-to-moderate bilateral lateral recess stenosis and minimal to mild bilateral neural foraminal stenosis without significant spinal stenosis. L3-4: Disc bulging and moderate to severe right and moderate left facet and ligamentum flavum hypertrophy result in mild-to-moderate right and mild left lateral recess stenosis and moderate right and mild left neural foraminal stenosis without spinal stenosis. L4-5: Disc bulging and severe facet and ligamentum flavum hypertrophy result in mild spinal stenosis, moderate left greater than right lateral recess stenosis, and moderate to severe right and severe left neural foraminal stenosis. Potential left L4 and L5 nerve root impingement. L5-S1: Disc bulging and moderate right and moderate to severe left facet hypertrophy and ligamentum flavum hypertrophy result in mild right greater than left lateral recess stenosis and moderate right and severe left neural foraminal stenosis with potential left L5 nerve root impingement. No spinal stenosis. In IMPRESSION: 1. Diffuse lumbar disc and facet degeneration, most notable at L4-5 where there is moderate lateral recess stenosis and moderate to severe right and severe left neural  foraminal stenosis. 2. Moderate right and severe left neural foraminal stenosis at L5-S1. Electronically Signed   By: Logan Bores M.D.   On: 04/27/2021 17:08    Assessment & Plan:   Problem List Items Addressed This Visit     Adverse drug reaction, initial encounter    Pruritic rash on neck and arm after starting molnupiravir and benzonatate.  Advised to stop both,  Use benadryl and delsym for cough,  Prednisone taper sent       Chronic obstructive pulmonary disease ([place])    Diagnosed but currently untreated .  Sees [facility] in April.  Will start maintenance therapy now with Breztri.       Relevant Medications   predniSONE (DELTASONE) 10 MG tablet   Budeson-Glycopyrrol-Formoterol (BREZTRI AEROSPHERE) 160-9-4.8 MCG/ACT AERO   COVID-19 virus infection    Symptoms are mild.  stoppig molnupiravir and benzonotate due to new onset rash       Weakness of both lower extremities    Likely secondary to lumbar radiculopathy .  PT ordered for eval and treat       Other Visit Diagnoses     Lumbar radiculopathy    -  Primary   Relevant Orders   Ambulatory referral to Physical Therapy   Ambulatory referral to Neurosurgery       I spent 40 minutes dedicated to the care of this patient on the date of this encounter to include pre-visit review of patient's medical history,  most recent imaging studies, Face-to-face time with the patient , and post visit ordering of testing and therapeutics.    Follow-up: No follow-ups on file.   [name], MD

## 2021-05-04 NOTE — Assessment & Plan Note (Signed)
Symptoms are mild.  stoppig molnupiravir and benzonotate due to new onset rash

## 2021-05-09 ENCOUNTER — Other Ambulatory Visit: Payer: Self-pay | Admitting: Internal Medicine

## 2021-05-16 DIAGNOSIS — N1831 Chronic kidney disease, stage 3a: Secondary | ICD-10-CM | POA: Diagnosis not present

## 2021-05-16 DIAGNOSIS — I10 Essential (primary) hypertension: Secondary | ICD-10-CM | POA: Diagnosis not present

## 2021-05-16 DIAGNOSIS — N1832 Chronic kidney disease, stage 3b: Secondary | ICD-10-CM | POA: Diagnosis not present

## 2021-05-17 ENCOUNTER — Other Ambulatory Visit: Payer: PPO

## 2021-05-17 ENCOUNTER — Other Ambulatory Visit: Payer: Self-pay

## 2021-05-17 DIAGNOSIS — R972 Elevated prostate specific antigen [PSA]: Secondary | ICD-10-CM | POA: Diagnosis not present

## 2021-05-18 DIAGNOSIS — M545 Low back pain, unspecified: Secondary | ICD-10-CM | POA: Diagnosis not present

## 2021-05-18 DIAGNOSIS — M6281 Muscle weakness (generalized): Secondary | ICD-10-CM | POA: Diagnosis not present

## 2021-05-18 DIAGNOSIS — M5416 Radiculopathy, lumbar region: Secondary | ICD-10-CM | POA: Diagnosis not present

## 2021-05-18 LAB — PSA: Prostate Specific Ag, Serum: 20.1 ng/mL — ABNORMAL HIGH (ref 0.0–4.0)

## 2021-05-19 DIAGNOSIS — M5416 Radiculopathy, lumbar region: Secondary | ICD-10-CM | POA: Diagnosis not present

## 2021-05-25 ENCOUNTER — Other Ambulatory Visit: Payer: Self-pay

## 2021-05-25 ENCOUNTER — Other Ambulatory Visit: Payer: Self-pay | Admitting: Internal Medicine

## 2021-05-25 ENCOUNTER — Ambulatory Visit: Payer: PPO | Admitting: Urology

## 2021-05-25 ENCOUNTER — Encounter: Payer: Self-pay | Admitting: Urology

## 2021-05-25 VITALS — BP 133/75 | HR 60 | Ht 75.0 in | Wt 163.0 lb

## 2021-05-25 DIAGNOSIS — R972 Elevated prostate specific antigen [PSA]: Secondary | ICD-10-CM | POA: Diagnosis not present

## 2021-05-25 NOTE — Patient Instructions (Signed)
Prostate Cancer Screening ?Prostate cancer screening is testing that is done to check for the presence of prostate cancer in men. The prostate gland is a walnut-sized gland that is located below the bladder and in front of the rectum in males. The function of the prostate is to add fluid to semen during ejaculation. Prostate cancer is one of the most common types of cancer in men. ?Who should have prostate cancer screening? ?Screening recommendations vary based on age and other risk factors, as well as between the professional organizations who make the recommendations. ?In general, screening is recommended if: ?You are age 82 to 6 and have an average risk for prostate cancer. You should talk with your health care provider about your need for screening and how often screening should be done. Because most prostate cancers are slow growing and will not cause death, screening in this age group is generally reserved for men who have a 66- to 15-year life expectancy. ?You are younger than age 47, and you have these risk factors: ?Having a father, brother, or uncle who has been diagnosed with prostate cancer. The risk is higher if your family member's cancer occurred at an early age or if you have multiple family members with prostate cancer at an early age. ?Being a male who is [place] or is of [place] or sub-Saharan African descent. ?In general, screening is not recommended if: ?You are younger than age 31. ?You are between the ages of 59 and 28 and you have no risk factors. ?You are 77 years of age or older. At this age, the risks that screening can cause are greater than the benefits that it may provide. ?If you are at high risk for prostate cancer, your health care provider may recommend that you have screenings more often or that you start screening at a younger age. ?How is screening for prostate cancer done? ?The recommended prostate cancer screening test is a blood test called the prostate-specific antigen (PSA)  test. PSA is a protein that is made in the prostate. As you age, your prostate naturally produces more PSA. Abnormally high PSA levels may be caused by: ?Prostate cancer. ?An enlarged prostate that is not caused by cancer (benign prostatic hyperplasia, or BPH). This condition is very common in older men. ?A prostate gland infection (prostatitis) or urinary tract infection. ?Certain medicines such as male hormones (like testosterone) or other medicines that raise testosterone levels. ?A rectal exam may be done as part of prostate cancer screening to help provide information about the size of your prostate gland. When a rectal exam is performed, it should be done after the PSA level is drawn to avoid any effect on the results. ?Depending on the PSA results, you may need more tests, such as: ?A physical exam to check the size of your prostate gland, if not done as part of screening. ?Blood and imaging tests. ?A procedure to remove tissue samples from your prostate gland for testing (biopsy). This is the only way to know for certain if you have prostate cancer. ?What are the benefits of prostate cancer screening? ?Screening can help to identify cancer at an early stage, before symptoms start and when the cancer can be treated more easily. ?There is a small chance that screening may lower your risk of dying from prostate cancer. The chance is small because prostate cancer is a slow-growing cancer, and most men with prostate cancer die from a different cause. ?What are the risks of prostate cancer screening? ?The main  risk of prostate cancer screening is diagnosing and treating prostate cancer that would never have caused any symptoms or problems. This is called overdiagnosisand overtreatment. PSA screening cannot tell you if your PSA is high due to cancer or a different cause. A prostate biopsy is the only procedure to diagnose prostate cancer. Even the results of a biopsy may not tell you if your cancer needs to be  treated. Slow-growing prostate cancer may not need any treatment other than monitoring, so diagnosing and treating it may cause unnecessary stress or other side effects. ?Questions to ask your health care provider ?When should I start prostate cancer screening? ?What is my risk for prostate cancer? ?How often do I need screening? ?What type of screening tests do I need? ?How do I get my test results? ?What do my results mean? ?Do I need treatment? ?Where to find more information ?The American Cancer Society: www.cancer.org ?American Urological Association: www.auanet.org ?Contact a health care provider if: ?You have difficulty urinating. ?You have pain when you urinate or ejaculate. ?You have blood in your urine or semen. ?You have pain in your back or in the area of your prostate. ?Summary ?Prostate cancer is a common type of cancer in men. The prostate gland is located below the bladder and in front of the rectum. This gland adds fluid to semen during ejaculation. ?Prostate cancer screening may identify cancer at an early stage, when the cancer can be treated more easily and is less likely to have spread to other areas of the body. ?The prostate-specific antigen (PSA) test is the recommended screening test for prostate cancer, but it has associated risks. ?Discuss the risks and benefits of prostate cancer screening with your health care provider. If you are age 48 or older, the risks that screening can cause are greater than the benefits that it may provide. ?This information is not intended to replace advice given to you by your health care provider. Make sure you discuss any questions you have with your health care provider. ?Document Revised: 08/23/2020 Document Reviewed: 08/23/2020 ?Elsevier Patient Education ? [place]. ? ?Prostate Cancer ?The prostate is a small gland that produces fluid that makes up semen (seminal fluid). It is located below the bladder in men, in front of the rectum. Prostate  cancer is the abnormal growth of cells in the prostate gland. ?What are the causes? ?The exact cause of this condition is not known. ?What increases the risk? ?You are more likely to develop this condition if: ?You are 31 years of age or older. ?You have a family history of prostate cancer. ?You have a family history of breast and ovarian cancer. ?You have genes that are passed from parent to child (inherited), such as BRCA1 and BRCA2. ?You have Lynch syndrome. ?African American men and men of African descent are diagnosed with prostate cancer at higher rates than other men. The reasons for this are not well understood and are likely due to a combination of genetic and environmental factors. ?What are the signs or symptoms? ?Symptoms of this condition include: ?Problems with urination. This may include: ?A weak or interrupted flow of urine. ?Trouble starting or stopping urination. ?Trouble emptying the bladder all the way. ?The need to urinate more often, especially at night. ?Blood in urine or semen. ?Persistent pain or discomfort in the lower back, lower abdomen, or hips. ?Trouble getting an erection. ?Weakness or numbness in the legs or feet. ?How is this diagnosed? ?This condition can be diagnosed with: ?A digital  rectal exam. For this exam, a health care provider inserts a gloved finger into the rectum to feel the prostate gland. ?A blood test called a prostate-specific antigen (PSA) test. ?A procedure in which a sample of tissue is taken from the prostate and checked under a microscope (prostate biopsy). ?An imaging test called transrectal ultrasonography. ?Once the condition is diagnosed, tests will be done to determine how far the cancer has spread. This is called staging the cancer. Staging may involve imaging tests, such as a bone scan, CT scan, PET scan, or MRI. ?Stages of prostate cancer ?The stages of prostate cancer are as follows: ?Stage 1 (I). At this stage, the cancer is found in the prostate only.  The cancer is not visible on imaging tests, and it is usually found by accident, such as during prostate surgery. ?Stage 2 (II). At this stage, the cancer is more advanced than it is in stage 1, but the can

## 2021-05-25 NOTE — Progress Notes (Signed)
? ?  05/25/2021 ?11:20 AM  ? ?Theodore [name] ?May 25, 1929 ?[id number] ? ?Reason for visit: Follow up elevated PSA, low testosterone ? ?HPI: ?88 year old male who I originally saw in September 2020 for an elevated PSA of 9.77 which had increased from 3.65 in 2016 and 2.1 in 2008.  I recommended follow-up with repeat PSA in 2 to 3 months, but he did not follow-up.  He has been on testosterone long-term over 10 years via PCP. ? ?His PCP repeated PSA which had jumped up to 16.2 in July 2022.  PSA was repeated in August and remained elevated at 14.8.  At that point we stopped his testosterone for 1 month repeat PSA on 11/22/2020 remained elevated at 14.5.  DRE at our last visit was benign.  Using shared decision making at our last visit he deferred biopsy and opted for a repeat PSA in 6 months ? ?PSA has continued to rise, most recently 21.7 in December 2022, and 20.1 on 05/17/2021.  PSA doubling time is 2.7 years ? ?He has severe lower back pain and sciatica, I personally viewed the MR lumbar spine dated 04/27/2021 that shows diffuse lumbar disc and facet degeneration as the etiology of his pain, no evidence of metastatic disease. ? ?We had another long conversation today about his likely diagnosis of prostate cancer based on the persistently rising PSA values.  He cannot get a prostate MRI secondary to an artificial knee, and remains averse to prostate biopsy secondary to risk factors.  We reviewed AUA guidelines that do not recommend routine screening in men over age 56, but we are in the situation where he has a persistently rising PSA, now over 20, that most likely represents prostate cancer.  We discussed that the majority of prostate cancers take 10 to 20 years to metastasize and cause symptoms, and that unfortunately any treatments for prostate cancer can significantly impact the quality of life, and do not necessarily have a significant impact on overall survival.  We discussed options of staging imaging with CT abdomen  and pelvis with contrast and bone scan, repeat PSA in 6 months, or prostate biopsy.  He is averse to any aggressive treatments, and with his age and increasing frailty he is very hesitant to undergo further investigations or biopsy.  He prefers more of a watchful waiting approach with repeat PSA in 6 months.  We discussed the risk of developing metastatic disease and additional symptoms, and he understands these risks.  Finally, we discussed the impact of testosterone on prostate cancer, and that androgen deprivation is the mainstay of treatment.  He feels his quality of life is significantly improved on the testosterone and he would like to continue this medication, and again he understands the risks. ? ?RTC 6 months PSA prior-patient prefers more of a watchful waiting approach for suspected prostate cancer ? ? ?Billey Co, MD ? ?[facility] ?[address] ?[place], [place] [address] ?([phone] ? ? ?

## 2021-05-31 DIAGNOSIS — G250 Essential tremor: Secondary | ICD-10-CM | POA: Diagnosis not present

## 2021-06-02 DIAGNOSIS — J449 Chronic obstructive pulmonary disease, unspecified: Secondary | ICD-10-CM | POA: Diagnosis not present

## 2021-06-06 DIAGNOSIS — M9903 Segmental and somatic dysfunction of lumbar region: Secondary | ICD-10-CM | POA: Diagnosis not present

## 2021-06-06 DIAGNOSIS — M5442 Lumbago with sciatica, left side: Secondary | ICD-10-CM | POA: Diagnosis not present

## 2021-06-09 DIAGNOSIS — M9903 Segmental and somatic dysfunction of lumbar region: Secondary | ICD-10-CM | POA: Diagnosis not present

## 2021-06-09 DIAGNOSIS — M5442 Lumbago with sciatica, left side: Secondary | ICD-10-CM | POA: Diagnosis not present

## 2021-06-13 DIAGNOSIS — M5416 Radiculopathy, lumbar region: Secondary | ICD-10-CM | POA: Diagnosis not present

## 2021-06-14 DIAGNOSIS — M9901 Segmental and somatic dysfunction of cervical region: Secondary | ICD-10-CM | POA: Diagnosis not present

## 2021-06-14 DIAGNOSIS — M9903 Segmental and somatic dysfunction of lumbar region: Secondary | ICD-10-CM | POA: Diagnosis not present

## 2021-06-14 DIAGNOSIS — M5442 Lumbago with sciatica, left side: Secondary | ICD-10-CM | POA: Diagnosis not present

## 2021-06-14 DIAGNOSIS — M9902 Segmental and somatic dysfunction of thoracic region: Secondary | ICD-10-CM | POA: Diagnosis not present

## 2021-06-14 DIAGNOSIS — M9904 Segmental and somatic dysfunction of sacral region: Secondary | ICD-10-CM | POA: Diagnosis not present

## 2021-06-15 ENCOUNTER — Ambulatory Visit: Payer: PPO | Admitting: Internal Medicine

## 2021-06-15 ENCOUNTER — Encounter: Payer: Self-pay | Admitting: Internal Medicine

## 2021-06-15 VITALS — BP 120/60 | HR 60 | Temp 97.6°F | Ht 75.0 in | Wt 170.0 lb

## 2021-06-15 DIAGNOSIS — M6281 Muscle weakness (generalized): Secondary | ICD-10-CM | POA: Diagnosis not present

## 2021-06-15 DIAGNOSIS — R0602 Shortness of breath: Secondary | ICD-10-CM

## 2021-06-15 NOTE — Progress Notes (Signed)
? ?Name: Theodore [name] ?MRN: [id number] ?DOB: 1929-11-15   ? ? ?CONSULTATION DATE:  06/15/2021 ? ? ?SYNOPSIS ?established care for COPD ?Patient uses his own NEB therapy as needed ?He takes electrodes and uses distilled water and mixes in [facility] and uses this in his own nebulizer ?He calls this concoction Colloidal Silver Nebulizers ? ?He uses this NEB therapy as   Needed and helps a lot with chest congestion and cough ?This seems to be a form of Saline NEBS ? ?Has PAD follows Vasc surgery ?He takes OTC EDTA -patient feels that this has helped his vascualr issues ? ? ? ?CHIEF COMPLAINT:  ?Follow-up MILD COPD ?Follow-up shortness of breath ? ?HISTORY OF PRESENT ILLNESS:  ?Increase shortness of breath and dyspnea on exertion ?Has extreme muscle weakness in the lower extremities ?Inability to take effective breaths ? ?No exacerbation at this time ?No evidence of heart failure at this time ?No evidence or signs of infection at this time ?No respiratory distress ?No fevers, chills, nausea, vomiting, diarrhea ?No evidence of lower extremity edema ?No evidence hemoptysis ? ?Has seen cardiology in the past month and has no significant issues ? ?Has had multiple ER visits with no definitive diagnosis with his shortness of breath ?I am very concerned that patient has had progressive muscle weakness which is also affecting his respiratory muscles as well ? ?AT THIS TIME, HIS SOB AND FATIGUE OS RELATED TO MUSCLE WEAKNESS ?CT SCANS AND VQ SCANS WNL ?PFT's shows very MILD form COPD which would NOT cause is symptoms ?Another concern is that patient is having problems with secretions and at times problem swallowing, has hard time getting up from the chair ? ? ? ? ? ?Review of Systems: ? ?Gen: in distress ?HEENT: Denies blurred vision, double vision, ear pain, eye pain, hearing loss, nose bleeds, sore throat ?Cardiac:  No dizziness, chest pain or heaviness, chest tightness,edema, No JVD ?Resp:   -cough, -sputum production, +shortness  of breath,-wheezing, -hemoptysis,  ?Other:  All other systems negative ? ? ? ?BP 120/60 (BP Location: Left Arm, Cuff Size: Normal)   Pulse 60   Temp 97.6 ?F (36.4 ?C) (Temporal)   Ht '6\' 3"'$  (1.905 m)   Wt 170 lb (77.1 kg)   SpO2 95%   BMI 21.25 kg/m?  ? ? ? ?Physical Examination:  ? ?General Appearance: +distress  ?EYES PERRLA, EOM intact.   ?NECK Supple, No JVD ?Pulmonary: normal breath sounds, No wheezing.  ?CardiovascularNormal S1,S2.  No m/r/g.   ?ALL OTHER ROS ARE NEGATIVE ? ? ? ?Chest x-ray February 2022 ?Personally reviewed today ?No significant findings ?No pneumonia no active disease ? ?CT chest 2018 ?No significant lung findings seen ?  ? ?ASSESSMENT / PLAN: ? ?88 year old pleasant white male seen today for chronic intermittent shortness of breath and dyspnea exertion with underlying COPD Gold stage A which is very mild in the setting of increased shortness of breath and dyspnea on exertion over the last several months which seems to be related to a progressive muscle weakness of his lower extremities and is concerning for respiratory muscle fatigue with inability to take effective breaths with respiratory insufficiency  ? ?I am concerned there is a neuromuscular degenerative disease  ?And is very concerning for symptoms of ALS or something similar to this disease ? ?PATIENT WILL NEED TO STOP ALL INHALERS ? ? ?MEDICATION ADJUSTMENTS/LABS AND TESTS ORDERED: ?Obtain pulmonary function test to assess for negative inspiratory force ?Will need to see neurologist soon as possible ? ?  CURRENT MEDICATIONS REVIEWED AT LENGTH WITH PATIENT TODAY ? ? ?Patient satisfied with Plan of action and management. All questions answered ? ?FOLLOW UP in 6 Months ? ?Total time spent 32 mins ? ? ?Corrin Parker, M.D.  ?[facility]  ?Medical Director [place] ?Medical Director [facility] Cardio-Pulmonary Department  ? ? ? ? ? ? ? ? ? ?

## 2021-06-15 NOTE — Patient Instructions (Signed)
Please stop all inhalers ? ?Fatigue and shortness of breath related to advanced age and muscle weakness NOT related to mild COPD ?CT scans and VQ scans are within normal limits ? ? ?Follow up with neurology as scheduled ?

## 2021-06-16 DIAGNOSIS — M545 Low back pain, unspecified: Secondary | ICD-10-CM | POA: Diagnosis not present

## 2021-06-16 DIAGNOSIS — M6281 Muscle weakness (generalized): Secondary | ICD-10-CM | POA: Diagnosis not present

## 2021-06-16 DIAGNOSIS — M5416 Radiculopathy, lumbar region: Secondary | ICD-10-CM | POA: Diagnosis not present

## 2021-06-20 DIAGNOSIS — J300 Vasomotor rhinitis: Secondary | ICD-10-CM | POA: Diagnosis not present

## 2021-06-20 DIAGNOSIS — J310 Chronic rhinitis: Secondary | ICD-10-CM | POA: Diagnosis not present

## 2021-06-23 ENCOUNTER — Other Ambulatory Visit: Payer: Self-pay | Admitting: Internal Medicine

## 2021-06-25 ENCOUNTER — Encounter: Payer: Self-pay | Admitting: Internal Medicine

## 2021-06-27 NOTE — Telephone Encounter (Signed)
Dr. Kasa, please advise. Thanks °

## 2021-07-04 DIAGNOSIS — G250 Essential tremor: Secondary | ICD-10-CM | POA: Diagnosis not present

## 2021-07-05 DIAGNOSIS — G250 Essential tremor: Secondary | ICD-10-CM | POA: Diagnosis not present

## 2021-07-07 DIAGNOSIS — M9904 Segmental and somatic dysfunction of sacral region: Secondary | ICD-10-CM | POA: Diagnosis not present

## 2021-07-07 DIAGNOSIS — M5442 Lumbago with sciatica, left side: Secondary | ICD-10-CM | POA: Diagnosis not present

## 2021-07-07 DIAGNOSIS — M9903 Segmental and somatic dysfunction of lumbar region: Secondary | ICD-10-CM | POA: Diagnosis not present

## 2021-07-07 DIAGNOSIS — M9902 Segmental and somatic dysfunction of thoracic region: Secondary | ICD-10-CM | POA: Diagnosis not present

## 2021-07-07 DIAGNOSIS — M9901 Segmental and somatic dysfunction of cervical region: Secondary | ICD-10-CM | POA: Diagnosis not present

## 2021-07-11 DIAGNOSIS — M5416 Radiculopathy, lumbar region: Secondary | ICD-10-CM | POA: Diagnosis not present

## 2021-07-11 DIAGNOSIS — M545 Low back pain, unspecified: Secondary | ICD-10-CM | POA: Diagnosis not present

## 2021-07-11 DIAGNOSIS — M6281 Muscle weakness (generalized): Secondary | ICD-10-CM | POA: Diagnosis not present

## 2021-07-12 ENCOUNTER — Telehealth: Payer: Self-pay | Admitting: Internal Medicine

## 2021-07-12 DIAGNOSIS — R0602 Shortness of breath: Secondary | ICD-10-CM

## 2021-07-12 NOTE — Telephone Encounter (Signed)
Lm for patient.  

## 2021-07-13 ENCOUNTER — Telehealth: Payer: Self-pay

## 2021-07-13 NOTE — Telephone Encounter (Signed)
-----   Message from Lynford Humphrey, RN sent at 07/13/2021  1:19 PM EDT ----- ?Reading the chart for this patient- he will not qualify for PR as he cannot get up out of chair easily.  He will need PT to strengthen his ability to move from one seat to another without assistance.  Please send him to PT first. We will watch his chart and enroll him in Pulmonary Rehab if his strength improves.  Thank you for the referral.  ?

## 2021-07-13 NOTE — Telephone Encounter (Signed)
Referral placed to pulmonary rehab. ?Patient is aware and voiced his understanding.  ?Nothing further needed.  ? ?

## 2021-07-13 NOTE — Telephone Encounter (Signed)
Dr. Kasa, please see below message and advise. Thanks °

## 2021-07-13 NOTE — Telephone Encounter (Signed)
Spoke to patient.  ?He is requesting a referral for pulmonary rehab. PT suggested it.  ? ?Dr. Mortimer Fries, please advise. Thanks ?

## 2021-07-14 NOTE — Telephone Encounter (Signed)
Lm for patient.  

## 2021-07-15 DIAGNOSIS — G250 Essential tremor: Secondary | ICD-10-CM | POA: Diagnosis not present

## 2021-07-15 DIAGNOSIS — R2689 Other abnormalities of gait and mobility: Secondary | ICD-10-CM | POA: Diagnosis not present

## 2021-07-15 DIAGNOSIS — R29898 Other symptoms and signs involving the musculoskeletal system: Secondary | ICD-10-CM | POA: Diagnosis not present

## 2021-07-15 DIAGNOSIS — R4189 Other symptoms and signs involving cognitive functions and awareness: Secondary | ICD-10-CM | POA: Diagnosis not present

## 2021-07-15 NOTE — Telephone Encounter (Signed)
Spoke to patient and relayed below message.  ?He stated that he complete PT this week and PT recommended pulmonary rehab.  ? ?Wynona Canes, please advise. Thanks ?

## 2021-07-15 NOTE — Telephone Encounter (Signed)
Lm for patient.  

## 2021-07-18 ENCOUNTER — Telehealth: Payer: Self-pay | Admitting: Internal Medicine

## 2021-07-18 NOTE — Telephone Encounter (Signed)
Patient called back and stated that he would like to proceed with pulmonary rehab.  ? ?Wynona Canes please advise. Thanks ?

## 2021-07-18 NOTE — Telephone Encounter (Signed)
Please see 07/14/2021 phone note.  ?

## 2021-07-18 NOTE — Telephone Encounter (Signed)
Noted.  Will close encounter.  

## 2021-07-18 NOTE — Telephone Encounter (Signed)
Spoke to patient. He stated that he is able to move from one seat to another without assistance. He uses a walker.  ?He also stated that his breathing has improved and he would like to hold off on pulmonary rehab for now.  ?Routing to [place] to make aware.  ?

## 2021-08-05 ENCOUNTER — Telehealth: Payer: Self-pay

## 2021-08-05 ENCOUNTER — Ambulatory Visit (INDEPENDENT_AMBULATORY_CARE_PROVIDER_SITE_OTHER): Payer: PPO

## 2021-08-05 VITALS — Ht 75.0 in | Wt 170.0 lb

## 2021-08-05 DIAGNOSIS — Z0000 Encounter for general adult medical examination without abnormal findings: Secondary | ICD-10-CM | POA: Diagnosis not present

## 2021-08-05 NOTE — Progress Notes (Signed)
Subjective:   Theodore [name] is a 88 y.o. male who presents for Medicare Annual/Subsequent preventive examination.  Review of Systems    No ROS.  Medicare Wellness Virtual Visit.  Visual/audio telehealth visit, UTA vital signs.   See social history for additional risk factors.   Cardiac Risk Factors include: advanced age (>81mn, >>60women);male gender     Objective:    Today's Vitals   08/05/21 1048  Weight: 170 lb (77.1 kg)  Height: '6\' 3"'$  (1.905 m)   Body mass index is 21.25 kg/m.     08/05/2021   10:56 AM 01/03/2021    1:26 PM 08/03/2020   10:59 AM 04/14/2020   10:34 AM 03/27/2019   11:31 AM 11/20/2017    9:51 AM 10/22/2017    7:58 AM  Advanced Directives  Does Patient Have a Medical Advance Directive? Yes No Yes No Yes Yes Yes  Type of AParamedicof [place]oughtonLiving will  [place]Living will  H[place]Living will H[place]Living will HCordovaLiving will  Does patient want to make changes to medical advance directive? No - Patient declined  No - Patient declined  No - Patient declined  No - Patient declined  Copy of HLakelandin Chart? No - copy requested  No - copy requested  No - copy requested Yes Yes  Would patient like information on creating a medical advance directive?  No - Patient declined  No - Patient declined       Current Medications (verified) Outpatient Encounter Medications as of 08/05/2021  Medication Sig   Alpha-Lipoic Acid 600 MG CAPS Take by mouth.   ascorbic Acid (VITAMIN C) 500 MG CPCR Take by mouth.   Budeson-Glycopyrrol-Formoterol (BREZTRI AEROSPHERE) 160-9-4.8 MCG/ACT AERO Inhale 2 puffs into the lungs in the morning and at bedtime. (Patient not taking: Reported on 06/15/2021)   buPROPion (WELLBUTRIN XL) 150 MG 24 hr tablet TAKE 1 TABLET BY MOUTH EVERY DAY   Cholecalciferol 125 MCG (5000 UT) TABS Take by mouth.   Coenzyme Q10  100 MG capsule Take by mouth.   cycloSPORINE (RESTASIS) 0.05 % ophthalmic emulsion Restasis 0.05 % eye drops in a dropperette  INSTILL 1 DROP INTO BOTH EYES TWICE A DAY   donepezil (ARICEPT) 10 MG tablet Take 10 mg by mouth every morning.   ezetimibe (ZETIA) 10 MG tablet Take 1 tablet (10 mg total) by mouth daily.   ipratropium (ATROVENT) 0.03 % nasal spray 1 SPRAY IN EACH NOSTRIL TWICE DAILY AS NEEDED FOR NASAL DRIP   ipratropium-albuterol (DUONEB) 0.5-2.5 (3) MG/3ML SOLN Take 3 mLs by nebulization every 6 (six) hours as needed.   levothyroxine (SYNTHROID) 100 MCG tablet TAKE 1 TABLET BY MOUTH EVERY DAY BEFORE BREAKFAST   magnesium oxide (MAG-OX) 400 MG tablet Take 400 mg by mouth daily.   OVER THE COUNTER MEDICATION Take 4 tablets by mouth daily. Standard Process- Cardio Plus   OVER THE COUNTER MEDICATION Take 1 tablet by mouth daily. Cardio Platinum - Vitamin K-2 500 mg, Arginine 300 mg, Natokinase 100 mg   OVER THE COUNTER MEDICATION Take by mouth daily. Hemp oil   Probiotic Product (PROBIOTIC + TURMERIC EXTRACT) 400 MG CAPS Take by mouth.   Probiotic Product (PROBIOTIC ACIDOPHILUS) CAPS Take 1 capsule by mouth daily.   rosuvastatin (CRESTOR) 10 MG tablet Take 1 tablet (10 mg total) by mouth daily.   sertraline (ZOLOFT) 50 MG tablet TAKE 1 TABLET (50  MG TOTAL) BY MOUTH DAILY. WITH A MEAL.   testosterone cypionate (DEPOTESTOSTERONE CYPIONATE) 200 MG/ML injection INJECT 0.5 ML (100 MG TOTAL) INTO THE MUSCLE EVERY 14 DAYS.   Turmeric 400 MG CAPS Take by mouth.   vitamin B-12 (CYANOCOBALAMIN) 500 MCG tablet Take 1 tablet by mouth daily.   No facility-administered encounter medications on file as of 08/05/2021.    Allergies (verified) Penicillins and Lexapro [escitalopram]   History: Past Medical History:  Diagnosis Date   Actinic keratosis    Arthritis    knees   Asymptomatic Sinus bradycardia    a. 05/2016 Zio: Avg HR 61 (41-167).   Benign essential tremor    Cancer ([place]) 2002    melanoma right ear   Colon polyp    COPD (chronic obstructive pulmonary disease) ([place])    pt said he believes it is a misdiagnosis    Coronary artery calcification seen on CT scan    a. 06/2016 CTA Chest: cor Ca2+; b. 05/2017 MV: EF >65%. No ischemia/infarct.   Depression    History of echocardiogram    a. 04/2016 Echo: EF 60-65%, mild conc LVH. Nl PASP.   History of kidney stones    Hx of dysplastic nevus 05/07/2017   L anterior shoulder, severe   Hx of dysplastic nevus 09/06/2020   R lower sternum, moderate atypia   Hypothyroidism    Hypothyroidism    Melanoma ([place]) 1990's per pt   R ear   Melanoma in situ ([place]) 01/12/2014   left jaw   Palpitations    a. 05/2016 Zio: Avg HR 61 (41-167). 9 SVT runs (fastest 167 - 5 beats; longest 8 beats - 101 bpm). Rare PACs/PVCs.   PVC's (premature ventricular contractions)    Rhinitis    Wears dentures    partial upper and lower   Wears hearing aid in both ears    Past Surgical History:  Procedure Laterality Date   ADENOIDECTOMY     CATARACT EXTRACTION W/PHACO Left 10/22/2017   Procedure: CATARACT EXTRACTION PHACO AND INTRAOCULAR LENS PLACEMENT (IOC)  LEFT;  Surgeon: Eulogio Bear, MD;  Location: [facility];  Service: Ophthalmology;  Laterality: Left;   CATARACT EXTRACTION W/PHACO Right 11/20/2017   Procedure: CATARACT EXTRACTION PHACO AND INTRAOCULAR LENS PLACEMENT (IOC) RIGHT;  Surgeon: Eulogio Bear, MD;  Location: [facility];  Service: Ophthalmology;  Laterality: Right;   ESOPHAGOGASTRODUODENOSCOPY (EGD) WITH PROPOFOL N/A 10/31/2016   Procedure: ESOPHAGOGASTRODUODENOSCOPY (EGD) WITH PROPOFOL;  Surgeon: Lollie Sails, MD;  Location: [facility] ENDOSCOPY;  Service: Endoscopy;  Laterality: N/A;   JOINT REPLACEMENT Right 2003   knee   knee meniscus repair Right    MELANOMA EXCISION Right    ear. Followed by Dr. Nehemiah Massed   PATELLECTOMY Bilateral    TONSILLECTOMY     Family History  Problem Relation Age of Onset    Hypertension Mother    Heart disease Mother        CHF   Heart disease Father    Cancer Sister        melanoma   Social History   Socioeconomic History   Marital status: Married    Spouse name: [name]   Number of children: 4   Years of education: 16   Highest education level: Not on file  Occupational History   Occupation: Retired    Comment: Architect -   Tobacco Use   Smoking status: Never   Smokeless tobacco: Never  Vaping Use   Vaping Use: Never used  Substance and Sexual Activity   Alcohol use: Yes    Comment: 1 glass of wine daily   Drug use: No   Sexual activity: Not Currently  Other Topics Concern   Not on file  Social History Narrative   Mr. Noffke grew up in [place], [place]. 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 He and his wife are living at [facility]. He is very active at [facility]. He enjoys music.    Social Determinants of Health   Financial Resource Strain: Low Risk    Difficulty of Paying Living Expenses: Not hard at all  Food Insecurity: No Food Insecurity   Worried About Charity fundraiser in the Last Year: Never true   [place] in the Last Year: Never true  Transportation Needs: No Transportation Needs   Lack of Transportation (Medical): No   Lack of Transportation (Non-Medical): No  Physical Activity: Insufficiently Active   Days of Exercise per Week: 4 days   Minutes of Exercise per Session: 30 min  Stress: No Stress Concern Present   Feeling of Stress : Only a little  Social Connections: Engineer, building services of Communication with Friends and Family: More than three times a week   Frequency of Social Gatherings with Friends and Family: Once a week   Attends Religious Services: 1 to 4 times per year    Active Member of [employer] or Organizations: Yes   Attends Music therapist: More than 4 times per year   Marital Status: Married    Tobacco Counseling Counseling given: Not Answered   Clinical Intake:  Pre-visit preparation completed: Yes        Diabetes: No  How often do you need to have someone help you when you read instructions, pamphlets, or other written materials from your doctor or pharmacy?: 1 - Never  Interpreter Needed?: No      Activities of Daily Living    08/05/2021   11:14 AM  In your present state of health, do you have any difficulty performing the following activities:  Hearing? 1  Comment Hearing aids  Vision? 0  Difficulty concentrating or making decisions? 1  Comment Aricept prescribed. Reports not having this medication. F/u with pharmacy.  Walking or climbing stairs? 1  Comment Paces self.  Dressing or bathing? 0  Doing errands, shopping? 0  Preparing Food and eating ? N  Using the Toilet? N  In the past six months, have you accidently leaked urine? N  Do you have problems with loss of bowel control? N  Managing your Medications? N  Managing your Finances? Y  Comment Wife manages  Housekeeping or managing your Housekeeping? N    Patient Care Team: [name], MD as PCP - General (Internal Medicine) Minna Merritts, MD as PCP - Cardiology (Cardiology)  Indicate any recent Medical Services you may have received from other than [facility] providers in the past year (date may be approximate).     Assessment:   This is a routine wellness examination for [place].  Virtual Visit via Telephone Note  I connected with  Theodore [name] on 08/05/21 at 10:15 AM EDT by telephone and verified that I am speaking with the correct person using two identifiers.  Persons participating in the virtual visit: patient/Nurse  Health Advisor   I discussed the limitations of performing an evaluation and management service by telehealth. We continued  and completed visit with audio only. Some vital signs may be absent or patient reported.   Hearing/Vision screen Hearing Screening - Comments:: Hearing aid, bilateral  Dietary issues and exercise activities discussed: Current Exercise Habits: Home exercise routine, Type of exercise: walking;strength training/weights (Leg exercises), Intensity: Mild Regular diet Good water intake   Goals Addressed               This Visit's Progress     Patient Stated     Follow up with pcp (pt-stated)        As needed       Depression Screen    08/05/2021   10:53 AM 05/04/2021   11:29 AM 12/07/2020   12:25 PM 09/16/2020   10:08 AM 08/03/2020   10:53 AM 04/06/2020    3:14 PM 09/24/2018   10:51 PM  PHQ 2/9 Scores  PHQ - 2 Score 0  '3 2 2 '$ 0 0  PHQ- 9 Score   '8 10 6  '$ 0  Exception Documentation  Other- indicate reason in comment box       Not completed  Pt unable to on phone         Fall Risk    08/05/2021   11:10 AM 01/17/2021    2:01 PM 12/07/2020   12:25 PM 09/16/2020   10:08 AM 08/03/2020   11:00 AM  [place] in the past year? 1 0 0 0 0  Number falls in past yr: 1    0  Comment Leg weakness and imbalance      Injury with Fall? 1    0  Comment Fell 2 weeks ago outdoors tripping over dog leash. Hit head and skinned legs. Did not seek medical attention. Denies n/v. Reports 2 black eyes. F/u appt scheduled with PCP. Encouraged to seek medical attention if symptoms change or worsen.      Risk for fall due to : History of fall(s);Impaired balance/gait No Fall Risks     Risk for fall due to: Comment Cane/walker in use      Follow up Falls evaluation completed Falls evaluation completed Falls evaluation completed Falls evaluation completed Falls evaluation completed    [place]: Home free of loose throw rugs in walkways, pet beds, electrical cords, etc? Yes  Adequate lighting in your home to reduce risk of falls? Yes   ASSISTIVE DEVICES UTILIZED TO  PREVENT FALLS: Life alert? No  Use of a cane, walker or w/c? Yes  Grab bars in the bathroom? Yes  Shower chair or bench in shower? No  Elevated toilet seat or a handicapped toilet? No   TIMED UP AND GO: Was the test performed? No .   Cognitive Function: Patient is alert.      03/30/2015    2:42 PM  MMSE - Mini Mental State Exam  Orientation to time 5  Orientation to Place 5  Registration 3  Attention/ Calculation 5  Recall 3  Language- name 2 objects 2  Language- repeat 1  Language- follow 3 step command 3  Language- read & follow direction 1  Write a sentence 0  Write a sentence-comments Some difficulty. Tremors.  Copy design 1  Total score 29        05/29/2017    9:50 AM 04/14/2016    4:35 PM  6CIT Screen  What Year? 0 points 0 points  What month? 0 points 0 points  What time? 0 points 0 points  Count back from 20 0 points 0 points  Months in reverse 0 points 0 points  Repeat phrase 0 points 0 points  Total Score 0 points 0 points    Immunizations Immunization History  Administered Date(s) Administered   Fluad Quad(high Dose 65+) 12/13/2018, 12/21/2020   Influenza,inj,Quad PF,6+ Mos 01/14/2014, 01/25/2018   Influenza-Unspecified 12/11/2012, 12/26/2019   Moderna Sars-Covid-2 Vaccination 04/25/2019, 05/16/2019, 06/26/2019, 01/17/2020   Pneumococcal Conjugate-13 01/14/2014, 10/04/2018, 12/13/2018   Pneumococcal Polysaccharide-23 04/28/2012   Tdap 08/19/2014   Zoster Recombinat (Shingrix) 03/12/2017   Zoster, Live 04/28/2010   Screening Tests Health Maintenance  Topic Date Due   Zoster Vaccines- Shingrix (2 of 2) 05/07/2017   COVID-19 Vaccine (5 - Booster) 08/21/2021 (Originally 03/13/2020)   INFLUENZA VACCINE  10/11/2021   TETANUS/TDAP  08/18/2024   Pneumonia Vaccine 84+ Years old  Completed   HPV VACCINES  Aged Out    Health Maintenance Health Maintenance Due  Topic Date Due   Zoster Vaccines- Shingrix (2 of 2) 05/07/2017   Lung Cancer Screening:  (Low Dose CT Chest recommended if Age 20-80 years, 30 pack-year currently smoking OR have quit w/in 15years.) does not qualify.   Hepatitis C Screening: does not qualify  Vision Screening: Recommended annual ophthalmology exams for early detection of glaucoma and other disorders of the eye.  Dental Screening: Recommended annual dental exams for proper oral hygiene  Community Resource Referral / Chronic Care Management: CRR required this visit?  No   CCM required this visit?  No      Plan:   Keep all routine maintenance appointments.   I have personally reviewed and noted the following in the patient's chart:   Medical and social history Use of alcohol, tobacco or illicit drugs  Current medications and supplements including opioid prescriptions. Patient is not currently taking opioid prescriptions. Functional ability and status Nutritional status Physical activity Advanced directives List of other physicians Hospitalizations, surgeries, and ER visits in previous 12 months Vitals Screenings to include cognitive, depression, and falls Referrals and appointments  In addition, I have reviewed and discussed with patient certain preventive protocols, quality metrics, and best practice recommendations. A written personalized care plan for preventive services as well as general preventive health recommendations were provided to patient.     Varney Biles, LPN   [id number]

## 2021-08-05 NOTE — Telephone Encounter (Signed)
Patient requests refill on Aricept '10mg'$ . Notes he has not taken since February. Pharmacy contacted and waiting on response from provider. Nurse is unable to order. Next OV with PCP 09/02/21.

## 2021-08-05 NOTE — Telephone Encounter (Signed)
Call pt Aricept is prescribed by neurology at [facility]  Please ask or caregiver to call for refill

## 2021-08-05 NOTE — Patient Instructions (Addendum)
  Theodore [name] , Thank you for taking time to come for your Medicare Wellness Visit. I appreciate your ongoing commitment to your health goals. Please review the following plan we discussed and let me know if I can assist you in the future.   These are the goals we discussed:  Goals       Patient Stated     Follow up with pcp (pt-stated)      As needed        This is a list of the screening recommended for you and due dates:  Health Maintenance  Topic Date Due   Zoster (Shingles) Vaccine (2 of 2) 05/07/2017   COVID-19 Vaccine (5 - Booster) 08/21/2021*   Flu Shot  10/11/2021   Tetanus Vaccine  08/18/2024   Pneumonia Vaccine  Completed   HPV Vaccine  Aged Out  *Topic was postponed. The date shown is not the original due date.

## 2021-08-09 DIAGNOSIS — J449 Chronic obstructive pulmonary disease, unspecified: Secondary | ICD-10-CM | POA: Diagnosis not present

## 2021-08-09 DIAGNOSIS — I739 Peripheral vascular disease, unspecified: Secondary | ICD-10-CM | POA: Diagnosis not present

## 2021-08-09 DIAGNOSIS — G250 Essential tremor: Secondary | ICD-10-CM | POA: Diagnosis not present

## 2021-08-09 DIAGNOSIS — Z515 Encounter for palliative care: Secondary | ICD-10-CM | POA: Diagnosis not present

## 2021-08-09 DIAGNOSIS — F3342 Major depressive disorder, recurrent, in full remission: Secondary | ICD-10-CM | POA: Diagnosis not present

## 2021-08-15 ENCOUNTER — Telehealth: Payer: Self-pay | Admitting: Internal Medicine

## 2021-08-15 NOTE — Telephone Encounter (Signed)
Claiborne Billings [facility]  want to know if provider would add on speech therapy for pt because he has slurred speech and difficulty finding words.

## 2021-08-16 NOTE — Telephone Encounter (Signed)
Spoke with Claiborne Billings and she stated that I would need to call [facility] where he is getting PT and give them the verbal to add on speech therapy.

## 2021-08-17 NOTE — Telephone Encounter (Signed)
Please refer to 07/13/2021 phone note.

## 2021-08-25 NOTE — Telephone Encounter (Signed)
Pt called back and stating he is not getting PT at this time but want the provider to recommend a speech therapist

## 2021-08-25 NOTE — Telephone Encounter (Signed)
LMTCB. Need to find out if pt is still getting PT at [facility]?

## 2021-08-26 ENCOUNTER — Encounter: Payer: Self-pay | Admitting: Internal Medicine

## 2021-08-26 ENCOUNTER — Telehealth: Payer: Self-pay | Admitting: Internal Medicine

## 2021-08-26 ENCOUNTER — Ambulatory Visit (INDEPENDENT_AMBULATORY_CARE_PROVIDER_SITE_OTHER): Payer: PPO | Admitting: Internal Medicine

## 2021-08-26 VITALS — BP 124/68 | HR 62 | Temp 98.0°F | Ht 75.0 in | Wt 175.0 lb

## 2021-08-26 DIAGNOSIS — R479 Unspecified speech disturbances: Secondary | ICD-10-CM | POA: Insufficient documentation

## 2021-08-26 DIAGNOSIS — E559 Vitamin D deficiency, unspecified: Secondary | ICD-10-CM | POA: Diagnosis not present

## 2021-08-26 DIAGNOSIS — R4789 Other speech disturbances: Secondary | ICD-10-CM

## 2021-08-26 DIAGNOSIS — Z79899 Other long term (current) drug therapy: Secondary | ICD-10-CM | POA: Diagnosis not present

## 2021-08-26 DIAGNOSIS — R1310 Dysphagia, unspecified: Secondary | ICD-10-CM

## 2021-08-26 DIAGNOSIS — M6281 Muscle weakness (generalized): Secondary | ICD-10-CM | POA: Diagnosis not present

## 2021-08-26 LAB — VITAMIN D 25 HYDROXY (VIT D DEFICIENCY, FRACTURES): VITD: 81.03 ng/mL (ref 30.00–100.00)

## 2021-08-26 LAB — COMPREHENSIVE METABOLIC PANEL
ALT: 17 U/L (ref 0–53)
AST: 18 U/L (ref 0–37)
Albumin: 4.1 g/dL (ref 3.5–5.2)
Alkaline Phosphatase: 44 U/L (ref 39–117)
BUN: 28 mg/dL — ABNORMAL HIGH (ref 6–23)
CO2: 30 mEq/L (ref 19–32)
Calcium: 9.4 mg/dL (ref 8.4–10.5)
Chloride: 103 mEq/L (ref 96–112)
Creatinine, Ser: 1.21 mg/dL (ref 0.40–1.50)
GFR: 52.82 mL/min — ABNORMAL LOW (ref >60.00)
Glucose, Bld: 72 mg/dL (ref 70–99)
Potassium: 4.4 mEq/L (ref 3.5–5.1)
Sodium: 140 mEq/L (ref 135–145)
Total Bilirubin: 1.4 mg/dL — ABNORMAL HIGH (ref 0.2–1.2)
Total Protein: 6.2 g/dL (ref 6.0–8.3)

## 2021-08-26 LAB — CBC WITH DIFFERENTIAL/PLATELET
Basophils Absolute: 0.1 10*3/uL (ref 0.0–0.1)
Basophils Relative: 0.9 % (ref 0.0–3.0)
Eosinophils Absolute: 0.2 10*3/uL (ref 0.0–0.7)
Eosinophils Relative: 2.8 % (ref 0.0–5.0)
HCT: 44.7 % (ref 39.0–52.0)
Hemoglobin: 15.1 g/dL (ref 13.0–17.0)
Lymphocytes Relative: 20.9 % (ref 12.0–46.0)
Lymphs Abs: 1.3 10*3/uL (ref 0.7–4.0)
MCHC: 33.8 g/dL (ref 30.0–36.0)
MCV: 100.9 fl — ABNORMAL HIGH (ref 78.0–100.0)
Monocytes Absolute: 0.6 10*3/uL (ref 0.1–1.0)
Monocytes Relative: 10.3 % (ref 3.0–12.0)
Neutro Abs: 4 10*3/uL (ref 1.4–7.7)
Neutrophils Relative %: 65.1 % (ref 43.0–77.0)
Platelets: 217 10*3/uL (ref 150.0–400.0)
RBC: 4.43 Mil/uL (ref 4.22–5.81)
RDW: 15.3 % (ref 11.5–15.5)
WBC: 6.1 10*3/uL (ref 4.0–10.5)

## 2021-08-26 LAB — TSH: TSH: 0.97 u[IU]/mL (ref 0.35–5.50)

## 2021-08-26 MED ORDER — ZOSTER VAC RECOMB ADJUVANTED 50 MCG/0.5ML IM SUSR: 0.5000 mL | Freq: Once | INTRAMUSCULAR | 1 refills | Status: AC

## 2021-08-26 NOTE — Telephone Encounter (Signed)
This was discussed during pt's office visit today and referral were placed.

## 2021-08-26 NOTE — Assessment & Plan Note (Signed)
Since his last thalotomy he has  Noted that he chokes on food and liquid if he talks while eating .  He does not have any issues if he concentrates on eating and refrains from talking

## 2021-08-26 NOTE — Assessment & Plan Note (Signed)
HE HAS developed dysrthria and word finding difficulty as a side effect of his bilateral (sequential) thalotomy procedures that were done in 2021 and April 2023 for corrective effect on his essential tremor.

## 2021-08-26 NOTE — Telephone Encounter (Signed)
FYI

## 2021-08-26 NOTE — Telephone Encounter (Signed)
Pt called in stating that he is taking Vitamin D3 2,000IU.Marland KitchenMarland KitchenMarland Kitchen

## 2021-08-26 NOTE — Progress Notes (Unsigned)
Subjective:  Patient ID: Theodore [name], male    DOB: 11-25-1929  Age: 88 y.o. MRN: [id number]  CC: There were no encounter diagnoses.   HPI Theodore [name] presents for MULTIPLE ISSUES Chief Complaint  Patient presents with   Follow-up    Follow up on falls  88 yr old male with  Mild cognitive impairement  essential tremor s/p  status post MRI Guided Focused Ultrasound Thalamotomy (Left Sided - 07/21/2019, Right sided,  07/06/21) presents with deterioration of   BALANCE AND SPEECH  since his last procedure     1) SPEECH FINDING DIFFICULTY: SPEECH THERAPY NEEDED slurred,  word finding ,  and occasional choking if he talks while eating    2) BALANCE DISORDER. WITH FREQUENT FALLS.   [facility] [place]  [facility] some scrapes and scabs on shins  losing balance  due to proximal muscle weakness,  and trouble getting up from squatting position    3) Essential tremor:  has returned In the right hand .  Making him feel depressed  can't control the paint brush or the spoon/fork when eating.  Hand writing has become illegible. Discussed his emotional response to these problems which are not correctible  Outpatient Medications Prior to Visit  Medication Sig Dispense Refill   Alpha-Lipoic Acid 600 MG CAPS Take by mouth.     ascorbic Acid (VITAMIN C) 500 MG CPCR Take by mouth.     buPROPion (WELLBUTRIN XL) 150 MG 24 hr tablet TAKE 1 TABLET BY MOUTH EVERY DAY 90 tablet 1   Cholecalciferol 125 MCG (5000 UT) TABS Take by mouth.     Coenzyme Q10 100 MG capsule Take by mouth.     cycloSPORINE (RESTASIS) 0.05 % ophthalmic emulsion Restasis 0.05 % eye drops in a dropperette  INSTILL 1 DROP INTO BOTH EYES TWICE A DAY     donepezil (ARICEPT) 10 MG tablet Take 10 mg by mouth every morning.     ezetimibe (ZETIA) 10 MG tablet Take 1 tablet (10 mg total) by mouth daily. 90 tablet 3   levothyroxine (SYNTHROID) 100 MCG tablet TAKE 1 TABLET BY MOUTH EVERY DAY BEFORE BREAKFAST 90 tablet 0   magnesium oxide  (MAG-OX) 400 MG tablet Take 400 mg by mouth daily.     OVER THE COUNTER MEDICATION Take by mouth daily. Hemp oil     Probiotic Product (PROBIOTIC + TURMERIC EXTRACT) 400 MG CAPS Take by mouth.     Probiotic Product (PROBIOTIC ACIDOPHILUS) CAPS Take 1 capsule by mouth daily.     rosuvastatin (CRESTOR) 10 MG tablet Take 1 tablet (10 mg total) by mouth daily. 90 tablet 1   sertraline (ZOLOFT) 50 MG tablet TAKE 1 TABLET (50 MG TOTAL) BY MOUTH DAILY. WITH A MEAL. 90 tablet 2   testosterone cypionate (DEPOTESTOSTERONE CYPIONATE) 200 MG/ML injection INJECT 0.5 ML (100 MG TOTAL) INTO THE MUSCLE EVERY 14 DAYS. 1 mL 5   Turmeric 400 MG CAPS Take by mouth.     vitamin B-12 (CYANOCOBALAMIN) 500 MCG tablet Take 1 tablet by mouth daily.     Budeson-Glycopyrrol-Formoterol (BREZTRI AEROSPHERE) 160-9-4.8 MCG/ACT AERO Inhale 2 puffs into the lungs in the morning and at bedtime. (Patient not taking: Reported on 06/15/2021) 5.9 g 2   ipratropium (ATROVENT) 0.03 % nasal spray 1 SPRAY IN EACH NOSTRIL TWICE DAILY AS NEEDED FOR NASAL DRIP     ipratropium-albuterol (DUONEB) 0.5-2.5 (3) MG/3ML SOLN Take 3 mLs by nebulization every 6 (six) hours as needed. Ceiba  mL 0   OVER THE COUNTER MEDICATION Take 4 tablets by mouth daily. Standard Process- Cardio Plus     OVER THE COUNTER MEDICATION Take 1 tablet by mouth daily. Cardio Platinum - Vitamin K-2 500 mg, Arginine 300 mg, Natokinase 100 mg     No facility-administered medications prior to visit.    Review of Systems;  Patient denies headache, fevers, malaise, unintentional weight loss, skin rash, eye pain, sinus congestion and sinus pain, sore throat, dysphagia,  hemoptysis , cough, dyspnea, wheezing, chest pain, palpitations, orthopnea, edema, abdominal pain, nausea, melena, diarrhea, constipation, flank pain, dysuria, hematuria, urinary  Frequency, nocturia, numbness, tingling, seizures,  Focal weakness, Loss of consciousness,  Tremor, insomnia, depression, anxiety, and  suicidal ideation.      Objective:  BP 124/68 (BP Location: Left Arm, Patient Position: Sitting, Cuff Size: Normal)   Pulse 62   Temp 98 F (36.7 C) (Oral)   Ht '6\' 3"'$  (1.905 m)   Wt 175 lb (79.4 kg)   SpO2 96%   BMI 21.87 kg/m   BP Readings from Last 3 Encounters:  08/26/21 124/68  06/15/21 120/60  05/25/21 133/75    Wt Readings from Last 3 Encounters:  08/26/21 175 lb (79.4 kg)  08/05/21 170 lb (77.1 kg)  06/15/21 170 lb (77.1 kg)    General appearance: alert, cooperative and appears stated age Ears: normal TM's and external ear canals both ears Throat: lips, mucosa, and tongue normal; teeth and gums normal Neck: no adenopathy, no carotid bruit, supple, symmetrical, trachea midline and thyroid not enlarged, symmetric, no tenderness/mass/nodules Back: symmetric, no curvature. ROM normal. No CVA tenderness. Lungs: clear to auscultation bilaterally Heart: regular rate and rhythm, S1, S2 normal, no murmur, click, rub or gallop Abdomen: soft, non-tender; bowel sounds normal; no masses,  no organomegaly Pulses: 2+ and symmetric Skin: Skin color, texture, turgor normal. No rashes or lesions Lymph nodes: Cervical, supraclavicular, and axillary nodes normal.  Lab Results  Component Value Date   HGBA1C 4.8 01/17/2021   HGBA1C 4.7 05/28/2018    Lab Results  Component Value Date   CREATININE 1.34 02/11/2021   CREATININE 1.30 01/17/2021   CREATININE 1.35 (H) 01/03/2021    Lab Results  Component Value Date   WBC 5.0 01/17/2021   HGB 14.9 01/17/2021   HCT 43.8 01/17/2021   PLT 243.0 01/17/2021   GLUCOSE 85 02/11/2021   CHOL 197 09/21/2020   TRIG 70.0 09/21/2020   HDL 62.00 09/21/2020   LDLCALC 121 (H) 09/21/2020   ALT 18 02/11/2021   AST 24 02/11/2021   NA 138 02/11/2021   K 4.3 02/11/2021   CL 100 02/11/2021   CREATININE 1.34 02/11/2021   BUN 26 (H) 02/11/2021   CO2 29 02/11/2021   TSH 1.55 01/17/2021   PSA 21.68 (H) 02/11/2021   HGBA1C 4.8 01/17/2021     MR Lumbar Spine Wo Contrast  Result Date: 04/27/2021 CLINICAL DATA:  Left lumbar radiculopathy. Weakness of both lower extremities. Low back pain radiating down the left leg. EXAM: MRI LUMBAR SPINE WITHOUT CONTRAST TECHNIQUE: Multiplanar, multisequence MR imaging of the lumbar spine was performed. No intravenous contrast was administered. COMPARISON:  Lumbar spine radiographs 02/24/2021 FINDINGS: Segmentation:  Standard. Alignment: Mild lumbar levoscoliosis. Trace retrolisthesis of L1 on L2, L2 on L3, L3 on L4, and L4 on L5. Vertebrae: No fracture, suspicious marrow lesion, or significant marrow edema. Scattered small Schmorl's nodes and chronic degenerative endplate changes. Conus medullaris and cauda equina: Conus extends to the L1-2 level. Conus  and cauda equina appear normal. Paraspinal and other soft tissues: Unremarkable. Disc levels: Disc desiccation throughout the lumbar spine. Moderate disc space narrowing from L2-3 to L4-5. T12-L1: Minimal disc bulging and mild facet hypertrophy without stenosis. L1-2: Mild disc bulging and mild-to-moderate facet and ligamentum flavum hypertrophy result in mild right lateral recess stenosis without spinal or neural foraminal stenosis. L2-3: Disc bulging and moderate to severe facet and ligamentum flavum hypertrophy result in mild-to-moderate bilateral lateral recess stenosis and minimal to mild bilateral neural foraminal stenosis without significant spinal stenosis. L3-4: Disc bulging and moderate to severe right and moderate left facet and ligamentum flavum hypertrophy result in mild-to-moderate right and mild left lateral recess stenosis and moderate right and mild left neural foraminal stenosis without spinal stenosis. L4-5: Disc bulging and severe facet and ligamentum flavum hypertrophy result in mild spinal stenosis, moderate left greater than right lateral recess stenosis, and moderate to severe right and severe left neural foraminal stenosis. Potential left  L4 and L5 nerve root impingement. L5-S1: Disc bulging and moderate right and moderate to severe left facet hypertrophy and ligamentum flavum hypertrophy result in mild right greater than left lateral recess stenosis and moderate right and severe left neural foraminal stenosis with potential left L5 nerve root impingement. No spinal stenosis. In IMPRESSION: 1. Diffuse lumbar disc and facet degeneration, most notable at L4-5 where there is moderate lateral recess stenosis and moderate to severe right and severe left neural foraminal stenosis. 2. Moderate right and severe left neural foraminal stenosis at L5-S1. Electronically Signed   By: Logan Bores M.D.   On: 04/27/2021 17:08    Assessment & Plan:   Problem List Items Addressed This Visit   None   I spent a total of   minutes with this patient in a face to face visit on the date of this encounter reviewing the last office visit with me on        ,  most recent with patient's cardiologist in    ,  patient'ss diet and eating habits, home blood pressure readings ,  most recent imaging study ,   and post visit ordering of testing and therapeutics.    Follow-up: No follow-ups on file.   [name], MD

## 2021-08-26 NOTE — Patient Instructions (Signed)
I DO NOT RECOMMEND ANY MORE COVID VACCINATIONS FOR YOU BECAUSE OF THE EFFECT THEY MAY HAVE ON YOUR BRAIN  SPEECH THERAPY AND PHYSICAL THERAPY REFERRALS ARE IN PROCESS   CHECK TO SEE HOW MUCH VITAMIN D YOU HAVE BEEN TAKING  I DO RECOMMEND THE SHINGLES VACCINE.  IT WILL BE FREE AT YOUR PHARMACY NOW  . YOU NEED 2 DOSES ABOUT 5 MONTHS APART

## 2021-08-27 DIAGNOSIS — M6281 Muscle weakness (generalized): Secondary | ICD-10-CM | POA: Insufficient documentation

## 2021-08-27 NOTE — Assessment & Plan Note (Signed)
Referring to PT for strength building

## 2021-08-29 ENCOUNTER — Other Ambulatory Visit: Payer: Self-pay | Admitting: Internal Medicine

## 2021-08-29 MED ORDER — VITAMIN D3 25 MCG (1000 UT) PO CAPS: 2000.0000 [IU] | ORAL_CAPSULE | Freq: Every day | ORAL | 3 refills | Status: DC

## 2021-09-02 ENCOUNTER — Ambulatory Visit: Payer: PPO | Admitting: Internal Medicine

## 2021-09-05 ENCOUNTER — Encounter: Payer: PPO | Admitting: Speech Pathology

## 2021-09-07 ENCOUNTER — Ambulatory Visit: Payer: PPO | Attending: Internal Medicine | Admitting: Speech Pathology

## 2021-09-07 DIAGNOSIS — R471 Dysarthria and anarthria: Secondary | ICD-10-CM | POA: Diagnosis not present

## 2021-09-07 DIAGNOSIS — R41841 Cognitive communication deficit: Secondary | ICD-10-CM | POA: Insufficient documentation

## 2021-09-07 DIAGNOSIS — R479 Unspecified speech disturbances: Secondary | ICD-10-CM | POA: Diagnosis not present

## 2021-09-07 DIAGNOSIS — R1312 Dysphagia, oropharyngeal phase: Secondary | ICD-10-CM | POA: Diagnosis not present

## 2021-09-07 DIAGNOSIS — R1310 Dysphagia, unspecified: Secondary | ICD-10-CM | POA: Insufficient documentation

## 2021-09-07 DIAGNOSIS — R490 Dysphonia: Secondary | ICD-10-CM | POA: Diagnosis not present

## 2021-09-08 DIAGNOSIS — R296 Repeated falls: Secondary | ICD-10-CM | POA: Diagnosis not present

## 2021-09-08 DIAGNOSIS — R2681 Unsteadiness on feet: Secondary | ICD-10-CM | POA: Diagnosis not present

## 2021-09-08 DIAGNOSIS — R4789 Other speech disturbances: Secondary | ICD-10-CM | POA: Diagnosis not present

## 2021-09-08 DIAGNOSIS — M6281 Muscle weakness (generalized): Secondary | ICD-10-CM | POA: Diagnosis not present

## 2021-09-09 ENCOUNTER — Encounter: Payer: Self-pay | Admitting: Speech Pathology

## 2021-09-09 NOTE — Therapy (Addendum)
OUTPATIENT SPEECH LANGUAGE PATHOLOGY EVALUATION   Patient Name: Theodore [name] MRN: [id number] DOB:[birth year], 88 y.o., male Today's Date: 09/09/2021  PCP: Duncan Dull, MD REFERRING PROVIDER: Duncan Dull, MD   End of Session - 09/09/21 0848     Visit Number 1    Number of Visits 25    Date for SLP Re-Evaluation 11/30/21    SLP Start Time 1430    SLP Stop Time  1545    SLP Time Calculation (min) 75 min    Activity Tolerance Patient tolerated treatment well             Past Medical History:  Diagnosis Date   Actinic keratosis    Arthritis    knees   Asymptomatic Sinus bradycardia    a. 05/2016 Zio: Avg HR 61 (41-167).   Benign essential tremor    Cancer (HCC) 2002   melanoma right ear   Colon polyp    COPD (chronic obstructive pulmonary disease) (HCC)    pt said he believes it is a misdiagnosis    Coronary artery calcification seen on CT scan    a. 06/2016 CTA Chest: cor Ca2+; b. 05/2017 MV: EF >65%. No ischemia/infarct.   Depression    History of echocardiogram    a. 04/2016 Echo: EF 60-65%, mild conc LVH. Nl PASP.   History of kidney stones    Hx of dysplastic nevus 05/07/2017   L anterior shoulder, severe   Hx of dysplastic nevus 09/06/2020   R lower sternum, moderate atypia   Hypothyroidism    Hypothyroidism    Melanoma (HCC) 1990's per pt   R ear   Melanoma in situ (HCC) 01/12/2014   left jaw   Palpitations    a. 05/2016 Zio: Avg HR 61 (41-167). 9 SVT runs (fastest 167 - 5 beats; longest 8 beats - 101 bpm). Rare PACs/PVCs.   PVC's (premature ventricular contractions)    Rhinitis    Wears dentures    partial upper and lower   Wears hearing aid in both ears    Past Surgical History:  Procedure Laterality Date   ADENOIDECTOMY     CATARACT EXTRACTION W/PHACO Left 10/22/2017   Procedure: CATARACT EXTRACTION PHACO AND INTRAOCULAR LENS PLACEMENT (IOC)  LEFT;  Surgeon: Nevada Crane, MD;  Location: [facility];  Service: Ophthalmology;   Laterality: Left;   CATARACT EXTRACTION W/PHACO Right 11/20/2017   Procedure: CATARACT EXTRACTION PHACO AND INTRAOCULAR LENS PLACEMENT (IOC) RIGHT;  Surgeon: Nevada Crane, MD;  Location: [facility];  Service: Ophthalmology;  Laterality: Right;   ESOPHAGOGASTRODUODENOSCOPY (EGD) WITH PROPOFOL N/A 10/31/2016   Procedure: ESOPHAGOGASTRODUODENOSCOPY (EGD) WITH PROPOFOL;  Surgeon: Christena Deem, MD;  Location: [facility];  Service: Endoscopy;  Laterality: N/A;   JOINT REPLACEMENT Right 2003   knee   knee meniscus repair Right    MELANOMA EXCISION Right    ear. Followed by Dr. Gwen Pounds   PATELLECTOMY Bilateral    TONSILLECTOMY     Patient Active Problem List   Diagnosis Date Noted   Proximal muscle weakness 08/27/2021   Speech disturbance 08/26/2021   Adverse drug reaction, initial encounter 05/04/2021   Left lumbar radiculopathy 04/25/2021   Thoracic aortic atherosclerosis (HCC) 01/17/2021   Dizziness 12/31/2020   Anxiety in acute stress reaction 12/07/2020   CKD (chronic kidney disease) stage 3, GFR 30-59 ml/min (HCC) 12/01/2020   Mild cognitive impairment with memory loss 11/30/2020   Elevated PSA, between 10 and less than 20 ng/ml 11/30/2020  COVID-19 virus infection 04/06/2020   Acquired trigger finger of left middle finger 08/07/2019   Carpal tunnel syndrome of right wrist 07/10/2019   Pain in right hand 06/05/2019   Ulnar neuropathy 06/05/2019   Essential tremor 05/28/2019   Disorder of bursae of shoulder region 01/29/2019   Localized, primary osteoarthritis of shoulder region 01/29/2019   Localized, secondary osteoarthritis of the shoulder region 01/29/2019   Osteoarthritis of knee 01/29/2019   Shoulder joint pain 01/29/2019   Chronic obstructive pulmonary disease (HCC) 09/24/2018   Grief 09/02/2018   Cervical radiculopathy 08/14/2018   Neck pain 08/12/2018   Sinus bradycardia 03/21/2018   DOE (dyspnea on exertion) 03/21/2018   PVC (premature  ventricular contraction) 03/21/2018   Weakness of both lower extremities 08/09/2017   Swallowing dysfunction 09/05/2016   Ringworm, body 06/27/2016   Bradycardia 05/10/2016   Coronary artery disease involving native coronary artery of native heart with angina pectoris (HCC)    PAD (peripheral artery disease) (HCC)    Asthma 05/09/2016   Anterior knee pain, left 12/26/2015   Hypothyroidism 07/04/2014   Hyperlipidemia LDL goal <100 07/04/2014   Nonallergic vasomotor rhinitis 07/01/2014   Long-term use of high-risk medication 01/16/2014   Hypertrophy of prostate with urinary obstruction and other lower urinary tract symptoms (LUTS) 08/08/2013   Hypogonadism male 08/08/2013   Vitamin D deficiency 07/31/2013   Depression with anxiety 04/28/2013   Low serum testosterone level 04/28/2013   Actinic keratosis 05/10/2010   History of malignant melanoma of skin 05/10/2010   Inflamed seborrheic keratosis 05/10/2010    ONSET DATE: 08/26/2021   REFERRING DIAG: R47.89 (ICD-10-CM) - Other speech disturbance  THERAPY DIAG:  Dysarthria and anarthria  Dysphagia, oropharyngeal phase  Dysphonia  Cognitive communication deficit  Speech disturbance, unspecified type  Swallowing dysfunction  Rationale for Evaluation and Treatment Rehabilitation  SUBJECTIVE:   SUBJECTIVE STATEMENT: Pt pleasant, fair historian Pt accompanied by: self  PERTINENT HISTORY: Mild cognitive impairment, essential tremor s/p status post MRI Guided Focused Ultrasound Thalamotomy (Left Sided - 07/21/2019, Right sided, 07/06/21)  PAIN:  Are you having pain? No   FALLS: Has patient fallen in last 6 months?  Yes, Number of falls: 3  LIVING ENVIRONMENT: Lives with: lives with their spouse Lives in: House/apartment  PLOF:  Level of assistance: Independent with ADLs, Independent with IADLs Employment: Retired   PATIENT GOALS "I want my voice to sound better, I use to sing and now it is  embarrassing"  OBJECTIVE:   DIAGNOSTIC FINDINGS:   [facility] - 2009 videolaryngostroboscopic findings revealed significant rhinitis as well as thick mucus along the vocal folds, very mild laryngeal edema, and MTD, with FEES showing moderate pharyngeal residue and 1 episode of flash/transient laryngeal penetration.   [facility] - 2017 mild-moderate oropharyngeal dysphagia characterized by delayed pharyngeal swallow initiation, decreased anterior hyoid movement, decreased tongue base retraction, and incomplete epiglottic inversion.  There is moderate vallecular and pyriform sinus residue across consistencies. The patient does not spontaneously swallow, indicating decreased sensation.  There was no observed laryngeal penetration/aspiration of solids, nectar-thick liquid, or small sips of thin liquid.  During rapid, consecutive gulping (5 swallows) of thin liquid there is deep laryngeal penetration with trace aspiration (X1) and trace laryngeal vestibule residue.  The patient cleared his throat after the trace aspiration.  COGNITION: Overall cognitive status: History of cognitive impairments - at baseline Functional deficits: confirmed mild cognitive impairment, fair historian, doesn't recall previous instrumental swallow studies  AUDITORY COMPREHENSION: Overall auditory comprehension: Appears intact  Pt is HOH and reports that is has been a year since his last audiological evaluation. Given pt's speech disturbance, recommend pt make an appointment with his audiologist.   READING COMPREHENSION: Intact  EXPRESSION: verbal  VERBAL EXPRESSION: Overall verbal expression: Appears intact Level of generative/spontaneous verbalization: conversation  WRITTEN EXPRESSION: Dominant hand: right  Written expression: Not tested  MOTOR SPEECH: Overall motor speech: impaired Level of impairment: Word Respiration: diaphragmatic/abdominal breathing Phonation: wet and low vocal  intensity Resonance:  disturbed Articulation: Impaired: word Intelligibility: Intelligibility reduced Motor planning: Impaired: unaware and consistent Motor speech errors: unaware and consistent Interfering components: hearing loss Effective technique: over articulate  ORAL MOTOR EXAMINATION Overall status: WFL     CLINICAL SWALLOW ASSESSMENT:   Current diet: regular and thin liquids Dentition:  partial plates Patient directly observed with POs: Yes: thin liquids  Feeding: able to feed self Liquids provided by: cup Oral phase signs and symptoms:  N/A Pharyngeal phase signs and symptoms: suspected delayed swallow initiation, wet vocal quality, and immediate throat clear Comments: recommend repeat instrumental swallow study    PATIENT REPORTED OUTCOME MEASURES (PROM):    TODAY'S TREATMENT:  Using mirror instructed pt in over-articulation   PATIENT EDUCATION: Education details: results of this assessment, ST POC Person educated: Patient Education method: Explanation Education comprehension: verbalized understanding and needs further education     GOALS: Goals reviewed with patient? Yes  SHORT TERM GOALS: Target date: 10 sessions   Pt will use speech intelligibility strategies to achieve > 95% speech intelligibility at the simple conversation level.  Baseline: imprecise articulation Goal status: INITIAL   2.  Pt will complete instrumental swallow assessment.  Baseline: to be scheduled Goal status: INITIAL     LONG TERM GOALS: Target date: 11/30/2021   Pt will utilize speech intelligibility strategies to achieve > 95% intelligibility at the complex conversation level to communicate abstract concepts/thoughts Baseline: < 80% Goal status: INITIAL   2.  Pt will consume least restrictive diet with minimal overt s/s of dysphagia and aspiration.  Baseline: intermittent s/s  Goal status: INITIAL     ASSESSMENT:  CLINICAL IMPRESSION: Patient is a 88 y.o. male  who was seen today for evaluation of speech and dysphagia.   OBJECTIVE IMPAIRMENTS include dysarthria, voice disorder, and dysphagia. These impairments are limiting patient from effectively communicating at home and in community and safety when swallowing. Factors affecting potential to achieve goals and functional outcome are co-morbidities and previous level of function. Patient will benefit from skilled SLP services to address above impairments and improve overall function.  REHAB POTENTIAL: Good  PLAN: SLP FREQUENCY: 1-2x/week  SLP DURATION: 12 weeks  PLANNED INTERVENTIONS: Aspiration precaution training, Pharyngeal strengthening exercises, Internal/external aids, SLP instruction and feedback, Compensatory strategies, and Patient/family education   Odean Fester B. Dreama Saa, M.S., CCC-SLP, Tree surgeon Certified Brain Injury Specialist [facility]  [facility] Services Office [phone] Ascom [phone] Fax [phone]

## 2021-09-12 DIAGNOSIS — R296 Repeated falls: Secondary | ICD-10-CM | POA: Diagnosis not present

## 2021-09-12 DIAGNOSIS — R2681 Unsteadiness on feet: Secondary | ICD-10-CM | POA: Diagnosis not present

## 2021-09-12 DIAGNOSIS — M6281 Muscle weakness (generalized): Secondary | ICD-10-CM | POA: Diagnosis not present

## 2021-09-12 DIAGNOSIS — R4789 Other speech disturbances: Secondary | ICD-10-CM | POA: Diagnosis not present

## 2021-09-16 ENCOUNTER — Ambulatory Visit: Payer: PPO | Attending: Internal Medicine | Admitting: Speech Pathology

## 2021-09-16 DIAGNOSIS — R479 Unspecified speech disturbances: Secondary | ICD-10-CM | POA: Insufficient documentation

## 2021-09-16 DIAGNOSIS — R471 Dysarthria and anarthria: Secondary | ICD-10-CM | POA: Insufficient documentation

## 2021-09-16 NOTE — Therapy (Signed)
OUTPATIENT SPEECH LANGUAGE PATHOLOGY EVALUATION   Patient Name: Theodore [name] MRN: [id number] DOB:[birth year], 88 y.o., male Today's Date: 09/16/2021  PCP: [name], MD REFERRING PROVIDER: [name], MD   End of Session - 09/16/21 0900     Visit Number 2    Number of Visits 25    Date for SLP Re-Evaluation 11/30/21    SLP Start Time 0900    SLP Stop Time  1000    SLP Time Calculation (min) 60 min    Activity Tolerance Patient tolerated treatment well             Past Medical History:  Diagnosis Date   Actinic keratosis    Arthritis    knees   Asymptomatic Sinus bradycardia    a. 05/2016 Zio: Avg HR 61 (41-167).   Benign essential tremor    Cancer ([place]) 2002   melanoma right ear   Colon polyp    COPD (chronic obstructive pulmonary disease) ([place])    pt said he believes it is a misdiagnosis    Coronary artery calcification seen on CT scan    a. 06/2016 CTA Chest: cor Ca2+; b. 05/2017 MV: EF >65%. No ischemia/infarct.   Depression    History of echocardiogram    a. 04/2016 Echo: EF 60-65%, mild conc LVH. Nl PASP.   History of kidney stones    Hx of dysplastic nevus 05/07/2017   L anterior shoulder, severe   Hx of dysplastic nevus 09/06/2020   R lower sternum, moderate atypia   Hypothyroidism    Hypothyroidism    Melanoma ([place]) 1990's per pt   R ear   Melanoma in situ ([place]) 01/12/2014   left jaw   Palpitations    a. 05/2016 Zio: Avg HR 61 (41-167). 9 SVT runs (fastest 167 - 5 beats; longest 8 beats - 101 bpm). Rare PACs/PVCs.   PVC's (premature ventricular contractions)    Rhinitis    Wears dentures    partial upper and lower   Wears hearing aid in both ears    Past Surgical History:  Procedure Laterality Date   ADENOIDECTOMY     CATARACT EXTRACTION W/PHACO Left 10/22/2017   Procedure: CATARACT EXTRACTION PHACO AND INTRAOCULAR LENS PLACEMENT (IOC)  LEFT;  Surgeon: Eulogio Bear, MD;  Location: [place];  Service: Ophthalmology;  Laterality:  Left;   CATARACT EXTRACTION W/PHACO Right 11/20/2017   Procedure: CATARACT EXTRACTION PHACO AND INTRAOCULAR LENS PLACEMENT (IOC) RIGHT;  Surgeon: Eulogio Bear, MD;  Location: [place];  Service: Ophthalmology;  Laterality: Right;   ESOPHAGOGASTRODUODENOSCOPY (EGD) WITH PROPOFOL N/A 10/31/2016   Procedure: ESOPHAGOGASTRODUODENOSCOPY (EGD) WITH PROPOFOL;  Surgeon: Lollie Sails, MD;  Location: [facility] END[facility];  Service: Endoscopy;  Laterality: N/A;   JOINT REPLACEMENT Right 2003   knee   knee meniscus repair Right    MELANOMA EXCISION Right    ear. Followed by Dr. Nehemiah Massed   PATELLECTOMY Bilateral    TONSILLECTOMY     Patient Active Problem List   Diagnosis Date Noted   Proximal muscle weakness 08/27/2021   Speech disturbance 08/26/2021   Adverse drug reaction, initial encounter 05/04/2021   Left lumbar radiculopathy 04/25/2021   Thoracic aortic atherosclerosis ([place]) 01/17/2021   Dizziness 12/31/2020   Anxiety in acute stress reaction 12/07/2020   CKD (chronic kidney disease) stage 3, GFR 30-59 ml/min ([place]) 12/01/2020   Mild cognitive impairment with memory loss 11/30/2020   Elevated PSA, between 10 and less than 20 ng/ml 11/30/2020  COVID-19 virus infection 04/06/2020   Acquired trigger finger of left middle finger 08/07/2019   Carpal tunnel syndrome of right wrist 07/10/2019   Pain in right hand 06/05/2019   Ulnar neuropathy 06/05/2019   Essential tremor 05/28/2019   Disorder of bursae of shoulder region 01/29/2019   Localized, primary osteoarthritis of shoulder region 01/29/2019   Localized, secondary osteoarthritis of the shoulder region 01/29/2019   Osteoarthritis of knee 01/29/2019   Shoulder joint pain 01/29/2019   Chronic obstructive pulmonary disease ([place]) 09/24/2018   Grief 09/02/2018   Cervical radiculopathy 08/14/2018   Neck pain 08/12/2018   Sinus bradycardia 03/21/2018   DOE (dyspnea on exertion) 03/21/2018   PVC (premature ventricular  contraction) 03/21/2018   Weakness of both lower extremities 08/09/2017   Swallowing dysfunction 09/05/2016   Ringworm, body 06/27/2016   Bradycardia 05/10/2016   Coronary artery disease involving native coronary artery of native heart with angina pectoris ([place])    PAD (peripheral artery disease) ([place])    Asthma 05/09/2016   Anterior knee pain, left 12/26/2015   Hypothyroidism 07/04/2014   Hyperlipidemia LDL goal <100 07/04/2014   Nonallergic vasomotor rhinitis 07/01/2014   Long-term use of high-risk medication 01/16/2014   Hypertrophy of prostate with urinary obstruction and other lower urinary tract symptoms (LUTS) 08/08/2013   Hypogonadism male 08/08/2013   Vitamin D deficiency 07/31/2013   Depression with anxiety 04/28/2013   Low serum testosterone level 04/28/2013   Actinic keratosis 05/10/2010   History of malignant melanoma of skin 05/10/2010   Inflamed seborrheic keratosis 05/10/2010    ONSET DATE: 08/26/2021   REFERRING DIAG: R47.89 (ICD-10-CM) - Other speech disturbance  THERAPY DIAG:  Dysarthria and anarthria  Speech disturbance, unspecified type  Rationale for Evaluation and Treatment Rehabilitation  SUBJECTIVE:   SUBJECTIVE STATEMENT:  PATIENT GOALS "I want my voice to sound better, I use to sing and now it is embarrassing"  OBJECTIVE:   DIAGNOSTIC FINDINGS:   [place] - 2009 videolaryngostroboscopic findings revealed significant rhinitis as well as thick mucus along the vocal folds, very mild laryngeal edema, and MTD, with FEES showing moderate pharyngeal residue and 1 episode of flash/transient laryngeal penetration.   [facility] - 2017 mild-moderate oropharyngeal dysphagia characterized by delayed pharyngeal swallow initiation, decreased anterior hyoid movement, decreased tongue base retraction, and incomplete epiglottic inversion.  There is moderate vallecular and pyriform sinus residue across consistencies. The patient does not  spontaneously swallow, indicating decreased sensation.  There was no observed laryngeal penetration/aspiration of solids, nectar-thick liquid, or small sips of thin liquid.  During rapid, consecutive gulping (5 swallows) of thin liquid there is deep laryngeal penetration with trace aspiration (X1) and trace laryngeal vestibule residue.  The patient cleared his throat after the trace aspiration.   PATIENT REPORTED OUTCOME MEASURES (PROM): VHI Score: The Voice Handicap Index is comprised of a series of questions to assess the patient's perception of their voice. It is designed to evaluate the emotional, physical and functional components of the voice problem.  Functional: 14 Physical: 16 Emotional: 24 Total: 54 (Normal mean 8.75, SD =14.97) z score = 3.02 (54 (total score) -8.75 / 14.97) no significant impact = 0-1.00, mild = 1.01-1.99, moderate = 2.00-2.99, severe = 3.00+    TODAY'S TREATMENT:  Moderate faded minimal verbal cues to use slow, loud, over-articulated and pausing when reading list of 80 functional sentences - no carry over noted but audio feedback helpful in contrasting speech intelligibility during structured reading vs conversational speech. Pt very aware of  difference. Pt with good immediate carryover into off-the-cuff conversation with self-correcting noted. SLP further facilitated session by providing patient list of riddles with rare min A to improve speech intelligibility to > 95%.      PATIENT EDUCATION: Education details: speech intelligibility strategies Person educated: Patient Education method: Explanation Education comprehension: verbalized understanding and needs further education     GOALS: Goals reviewed with patient? Yes  SHORT TERM GOALS: Target date: 10 sessions   Pt will use speech intelligibility strategies to achieve > 95% speech intelligibility at the simple conversation level.  Baseline: imprecise articulation Goal status: INITIAL   2.  Pt will  complete instrumental swallow assessment.  Baseline: to be scheduled Goal status: INITIAL     LONG TERM GOALS: Target date: 11/30/2021   Pt will utilize speech intelligibility strategies to achieve > 95% intelligibility at the complex conversation level to communicate abstract concepts/thoughts Baseline: < 80% Goal status: INITIAL   2.  Pt will consume least restrictive diet with minimal overt s/s of dysphagia and aspiration.  Baseline: intermittent s/s  Goal status: INITIAL     ASSESSMENT:  CLINICAL IMPRESSION: Pt presents with improved insight into use of speech intelligibilities strategies. As a result, he began demonstrating great carryover into simple conversation.   OBJECTIVE IMPAIRMENTS include dysarthria, voice disorder, and dysphagia. These impairments are limiting patient from effectively communicating at home and in community and safety when swallowing. Factors affecting potential to achieve goals and functional outcome are co-morbidities and previous level of function. Patient will benefit from skilled SLP services to address above impairments and improve overall function.  REHAB POTENTIAL: Good  PLAN: SLP FREQUENCY: 1-2x/week  SLP DURATION: 12 weeks  PLANNED INTERVENTIONS: Aspiration precaution training, Pharyngeal strengthening exercises, Internal/external aids, SLP instruction and feedback, Compensatory strategies, and Patient/family education   Cecily Lawhorne B. Rutherford Nail, M.S., CCC-SLP, Mining engineer Certified Brain Injury [place]  [place] Office [phone] Ascom [phone] Fax [phone]

## 2021-09-19 ENCOUNTER — Telehealth: Payer: Self-pay | Admitting: Internal Medicine

## 2021-09-19 NOTE — Telephone Encounter (Signed)
Patient dropped off image of leg.  Placed in colored folder up front.

## 2021-09-20 ENCOUNTER — Other Ambulatory Visit: Payer: Self-pay | Admitting: Internal Medicine

## 2021-09-20 ENCOUNTER — Ambulatory Visit: Payer: PPO | Admitting: Speech Pathology

## 2021-09-20 DIAGNOSIS — M9903 Segmental and somatic dysfunction of lumbar region: Secondary | ICD-10-CM | POA: Diagnosis not present

## 2021-09-20 DIAGNOSIS — M9904 Segmental and somatic dysfunction of sacral region: Secondary | ICD-10-CM | POA: Diagnosis not present

## 2021-09-20 DIAGNOSIS — M9901 Segmental and somatic dysfunction of cervical region: Secondary | ICD-10-CM | POA: Diagnosis not present

## 2021-09-20 DIAGNOSIS — M9902 Segmental and somatic dysfunction of thoracic region: Secondary | ICD-10-CM | POA: Diagnosis not present

## 2021-09-20 NOTE — Telephone Encounter (Signed)
Pt has been scheduled for tomorrow at 1pm. Pt is aware of appt date and time.

## 2021-09-21 ENCOUNTER — Ambulatory Visit (INDEPENDENT_AMBULATORY_CARE_PROVIDER_SITE_OTHER): Payer: PPO | Admitting: Internal Medicine

## 2021-09-21 ENCOUNTER — Encounter: Payer: Self-pay | Admitting: Internal Medicine

## 2021-09-21 VITALS — BP 120/78 | HR 82 | Temp 97.9°F | Resp 19 | Ht 73.0 in | Wt 175.0 lb

## 2021-09-21 DIAGNOSIS — S81802A Unspecified open wound, left lower leg, initial encounter: Secondary | ICD-10-CM

## 2021-09-21 DIAGNOSIS — T148XXA Other injury of unspecified body region, initial encounter: Secondary | ICD-10-CM

## 2021-09-21 NOTE — Progress Notes (Unsigned)
Subjective:  Patient ID: Theodore [name], male    DOB: 02/19/1930  Age: 88 y.o. MRN: [id number]  CC: There were no encounter diagnoses.   HPI [name] presents for evaluation of a non healing wound to left shin Chief Complaint  Patient presents with   Wound Check   88 yr old male with no known history of PAD presents with a nonhealing wound to left shin that has been present for six weeks.  Occurred as a result of an injury.  Patient treated with vaseline and occlusive constrictive dressing ([place] wrap) initially but wound became irritated    Outpatient Medications Prior to Visit  Medication Sig Dispense Refill   Alpha-Lipoic Acid 600 MG CAPS Take by mouth.     ascorbic Acid (VITAMIN C) 500 MG CPCR Take by mouth.     buPROPion (WELLBUTRIN XL) 150 MG 24 hr tablet TAKE 1 TABLET BY MOUTH EVERY DAY 90 tablet 1   Cholecalciferol (VITAMIN D3) 25 MCG (1000 UT) CAPS Take 2 capsules (2,000 Units total) by mouth daily. 180 capsule 3   Coenzyme Q10 100 MG capsule Take by mouth.     cycloSPORINE (RESTASIS) 0.05 % ophthalmic emulsion Restasis 0.05 % eye drops in a dropperette  INSTILL 1 DROP INTO BOTH EYES TWICE A DAY     donepezil (ARICEPT) 10 MG tablet Take 10 mg by mouth every morning.     ezetimibe (ZETIA) 10 MG tablet Take 1 tablet (10 mg total) by mouth daily. 90 tablet 3   levothyroxine (SYNTHROID) 100 MCG tablet TAKE 1 TABLET BY MOUTH EVERY DAY BEFORE BREAKFAST 90 tablet 0   magnesium oxide (MAG-OX) 400 MG tablet Take 400 mg by mouth daily.     OVER THE COUNTER MEDICATION Take by mouth daily. Hemp oil     Probiotic Product (PROBIOTIC + TURMERIC EXTRACT) 400 MG CAPS Take by mouth.     Probiotic Product (PROBIOTIC ACIDOPHILUS) CAPS Take 1 capsule by mouth daily.     rosuvastatin (CRESTOR) 10 MG tablet Take 1 tablet (10 mg total) by mouth daily. 90 tablet 1   sertraline (ZOLOFT) 50 MG tablet TAKE 1 TABLET (50 MG TOTAL) BY MOUTH DAILY. WITH A MEAL. 90 tablet 2   testosterone cypionate  (DEPOTESTOSTERONE CYPIONATE) 200 MG/ML injection INJECT 0.5 ML (100 MG TOTAL) INTO THE MUSCLE EVERY 14 DAYS. 1 mL 5   Turmeric 400 MG CAPS Take by mouth.     vitamin B-12 (CYANOCOBALAMIN) 500 MCG tablet Take 1 tablet by mouth daily.     No facility-administered medications prior to visit.    Review of Systems;  Patient denies headache, fevers, malaise, unintentional weight loss, skin rash, eye pain, sinus congestion and sinus pain, sore throat, dysphagia,  hemoptysis , cough, dyspnea, wheezing, chest pain, palpitations, orthopnea, edema, abdominal pain, nausea, melena, diarrhea, constipation, flank pain, dysuria, hematuria, urinary  Frequency, nocturia, numbness, tingling, seizures,  Focal weakness, Loss of consciousness,  Tremor, insomnia, depression, anxiety, and suicidal ideation.      Objective:  There were no vitals taken for this visit.  BP Readings from Last 3 Encounters:  08/26/21 124/68  06/15/21 120/60  05/25/21 133/75    Wt Readings from Last 3 Encounters:  08/26/21 175 lb (79.4 kg)  08/05/21 170 lb (77.1 kg)  06/15/21 170 lb (77.1 kg)    General appearance: alert, cooperative and appears stated age Ears: normal TM's and external ear canals both ears Throat: lips, mucosa, and tongue normal; teeth and gums normal Neck:  no adenopathy, no carotid bruit, supple, symmetrical, trachea midline and thyroid not enlarged, symmetric, no tenderness/mass/nodules Back: symmetric, no curvature. ROM normal. No CVA tenderness. Lungs: clear to auscultation bilaterally Heart: regular rate and rhythm, S1, S2 normal, no murmur, click, rub or gallop Abdomen: soft, non-tender; bowel sounds normal; no masses,  no organomegaly Pulses: 2+ and symmetric Skin: Skin color, texture, turgor normal. No rashes or lesions Lymph nodes: Cervical, supraclavicular, and axillary nodes normal.  Lab Results  Component Value Date   HGBA1C 4.8 01/17/2021   HGBA1C 4.7 05/28/2018    Lab Results   Component Value Date   CREATININE 1.21 08/26/2021   CREATININE 1.34 02/11/2021   CREATININE 1.30 01/17/2021    Lab Results  Component Value Date   WBC 6.1 08/26/2021   HGB 15.1 08/26/2021   HCT 44.7 08/26/2021   PLT 217.0 08/26/2021   GLUCOSE 72 08/26/2021   CHOL 197 09/21/2020   TRIG 70.0 09/21/2020   HDL 62.00 09/21/2020   LDLCALC 121 (H) 09/21/2020   ALT 17 08/26/2021   AST 18 08/26/2021   NA 140 08/26/2021   K 4.4 08/26/2021   CL 103 08/26/2021   CREATININE 1.21 08/26/2021   BUN 28 (H) 08/26/2021   CO2 30 08/26/2021   TSH 0.97 08/26/2021   PSA 21.68 (H) 02/11/2021   HGBA1C 4.8 01/17/2021    MR Lumbar Spine Wo Contrast  Result Date: 04/27/2021 CLINICAL DATA:  Left lumbar radiculopathy. Weakness of both lower extremities. Low back pain radiating down the left leg. EXAM: MRI LUMBAR SPINE WITHOUT CONTRAST TECHNIQUE: Multiplanar, multisequence MR imaging of the lumbar spine was performed. No intravenous contrast was administered. COMPARISON:  Lumbar spine radiographs 02/24/2021 FINDINGS: Segmentation:  Standard. Alignment: Mild lumbar levoscoliosis. Trace retrolisthesis of L1 on L2, L2 on L3, L3 on L4, and L4 on L5. Vertebrae: No fracture, suspicious marrow lesion, or significant marrow edema. Scattered small Schmorl's nodes and chronic degenerative endplate changes. Conus medullaris and cauda equina: Conus extends to the L1-2 level. Conus and cauda equina appear normal. Paraspinal and other soft tissues: Unremarkable. Disc levels: Disc desiccation throughout the lumbar spine. Moderate disc space narrowing from L2-3 to L4-5. T12-L1: Minimal disc bulging and mild facet hypertrophy without stenosis. L1-2: Mild disc bulging and mild-to-moderate facet and ligamentum flavum hypertrophy result in mild right lateral recess stenosis without spinal or neural foraminal stenosis. L2-3: Disc bulging and moderate to severe facet and ligamentum flavum hypertrophy result in mild-to-moderate  bilateral lateral recess stenosis and minimal to mild bilateral neural foraminal stenosis without significant spinal stenosis. L3-4: Disc bulging and moderate to severe right and moderate left facet and ligamentum flavum hypertrophy result in mild-to-moderate right and mild left lateral recess stenosis and moderate right and mild left neural foraminal stenosis without spinal stenosis. L4-5: Disc bulging and severe facet and ligamentum flavum hypertrophy result in mild spinal stenosis, moderate left greater than right lateral recess stenosis, and moderate to severe right and severe left neural foraminal stenosis. Potential left L4 and L5 nerve root impingement. L5-S1: Disc bulging and moderate right and moderate to severe left facet hypertrophy and ligamentum flavum hypertrophy result in mild right greater than left lateral recess stenosis and moderate right and severe left neural foraminal stenosis with potential left L5 nerve root impingement. No spinal stenosis. In IMPRESSION: 1. Diffuse lumbar disc and facet degeneration, most notable at L4-5 where there is moderate lateral recess stenosis and moderate to severe right and severe left neural foraminal stenosis. 2. Moderate right and severe  left neural foraminal stenosis at L5-S1. Electronically Signed   By: Logan Bores M.D.   On: 04/27/2021 17:08    Assessment & Plan:   Problem List Items Addressed This Visit   None   I spent a total of   minutes with this patient in a face to face visit on the date of this encounter reviewing the last office visit with me on        ,  most recent with patient's cardiologist in    ,  patient'ss diet and eating habits, home blood pressure readings ,  most recent imaging study ,   and post visit ordering of testing and therapeutics.    Follow-up: No follow-ups on file.   [name], MD

## 2021-09-21 NOTE — Patient Instructions (Addendum)
No more vaseline!  Wound is too moist!  No more tight /constrictive dressings until we have your circulation evaluated   Do not immerse in water .  Rinse with sterile saline after your shower  Nonstick dressings.  Change daily whenever saturated   Wound care referral In process

## 2021-09-22 ENCOUNTER — Encounter: Payer: PPO | Admitting: Speech Pathology

## 2021-09-22 DIAGNOSIS — T148XXA Other injury of unspecified body region, initial encounter: Secondary | ICD-10-CM | POA: Insufficient documentation

## 2021-09-22 DIAGNOSIS — M9901 Segmental and somatic dysfunction of cervical region: Secondary | ICD-10-CM | POA: Diagnosis not present

## 2021-09-22 DIAGNOSIS — M9903 Segmental and somatic dysfunction of lumbar region: Secondary | ICD-10-CM | POA: Diagnosis not present

## 2021-09-22 DIAGNOSIS — M9902 Segmental and somatic dysfunction of thoracic region: Secondary | ICD-10-CM | POA: Diagnosis not present

## 2021-09-22 DIAGNOSIS — M9904 Segmental and somatic dysfunction of sacral region: Secondary | ICD-10-CM | POA: Diagnosis not present

## 2021-09-22 DIAGNOSIS — L089 Local infection of the skin and subcutaneous tissue, unspecified: Secondary | ICD-10-CM | POA: Insufficient documentation

## 2021-09-22 NOTE — Assessment & Plan Note (Addendum)
Reasons for non healing may include moisture imbalance and arterial insufficiency . Referring to Vascular surgery for ABI's.  Advised to stop using  vaseline

## 2021-09-27 ENCOUNTER — Ambulatory Visit: Payer: PPO | Admitting: Speech Pathology

## 2021-09-27 DIAGNOSIS — M9901 Segmental and somatic dysfunction of cervical region: Secondary | ICD-10-CM | POA: Diagnosis not present

## 2021-09-27 DIAGNOSIS — M9904 Segmental and somatic dysfunction of sacral region: Secondary | ICD-10-CM | POA: Diagnosis not present

## 2021-09-27 DIAGNOSIS — M9902 Segmental and somatic dysfunction of thoracic region: Secondary | ICD-10-CM | POA: Diagnosis not present

## 2021-09-27 DIAGNOSIS — M9903 Segmental and somatic dysfunction of lumbar region: Secondary | ICD-10-CM | POA: Diagnosis not present

## 2021-09-27 DIAGNOSIS — M5442 Lumbago with sciatica, left side: Secondary | ICD-10-CM | POA: Diagnosis not present

## 2021-09-29 ENCOUNTER — Other Ambulatory Visit (INDEPENDENT_AMBULATORY_CARE_PROVIDER_SITE_OTHER): Payer: Self-pay | Admitting: Vascular Surgery

## 2021-09-29 ENCOUNTER — Ambulatory Visit: Payer: PPO | Admitting: Speech Pathology

## 2021-09-29 DIAGNOSIS — M9902 Segmental and somatic dysfunction of thoracic region: Secondary | ICD-10-CM | POA: Diagnosis not present

## 2021-09-29 DIAGNOSIS — M5442 Lumbago with sciatica, left side: Secondary | ICD-10-CM | POA: Diagnosis not present

## 2021-09-29 DIAGNOSIS — M9903 Segmental and somatic dysfunction of lumbar region: Secondary | ICD-10-CM | POA: Diagnosis not present

## 2021-09-29 DIAGNOSIS — L97929 Non-pressure chronic ulcer of unspecified part of left lower leg with unspecified severity: Secondary | ICD-10-CM

## 2021-09-29 DIAGNOSIS — M9901 Segmental and somatic dysfunction of cervical region: Secondary | ICD-10-CM | POA: Diagnosis not present

## 2021-09-29 DIAGNOSIS — M9904 Segmental and somatic dysfunction of sacral region: Secondary | ICD-10-CM | POA: Diagnosis not present

## 2021-09-30 ENCOUNTER — Encounter (INDEPENDENT_AMBULATORY_CARE_PROVIDER_SITE_OTHER): Payer: Self-pay | Admitting: Nurse Practitioner

## 2021-09-30 ENCOUNTER — Ambulatory Visit: Payer: PPO | Admitting: Physician Assistant

## 2021-09-30 ENCOUNTER — Ambulatory Visit (INDEPENDENT_AMBULATORY_CARE_PROVIDER_SITE_OTHER): Payer: PPO

## 2021-09-30 ENCOUNTER — Ambulatory Visit (INDEPENDENT_AMBULATORY_CARE_PROVIDER_SITE_OTHER): Payer: PPO | Admitting: Nurse Practitioner

## 2021-09-30 VITALS — BP 136/68 | HR 67 | Resp 17 | Ht 74.0 in | Wt 176.0 lb

## 2021-09-30 DIAGNOSIS — L97929 Non-pressure chronic ulcer of unspecified part of left lower leg with unspecified severity: Secondary | ICD-10-CM

## 2021-09-30 DIAGNOSIS — E785 Hyperlipidemia, unspecified: Secondary | ICD-10-CM

## 2021-10-03 ENCOUNTER — Encounter: Payer: PPO | Attending: Physician Assistant | Admitting: Internal Medicine

## 2021-10-03 DIAGNOSIS — L97828 Non-pressure chronic ulcer of other part of left lower leg with other specified severity: Secondary | ICD-10-CM | POA: Insufficient documentation

## 2021-10-03 DIAGNOSIS — R269 Unspecified abnormalities of gait and mobility: Secondary | ICD-10-CM | POA: Diagnosis not present

## 2021-10-03 DIAGNOSIS — I2510 Atherosclerotic heart disease of native coronary artery without angina pectoris: Secondary | ICD-10-CM | POA: Diagnosis not present

## 2021-10-03 DIAGNOSIS — M5416 Radiculopathy, lumbar region: Secondary | ICD-10-CM | POA: Insufficient documentation

## 2021-10-03 DIAGNOSIS — N1831 Chronic kidney disease, stage 3a: Secondary | ICD-10-CM | POA: Insufficient documentation

## 2021-10-03 DIAGNOSIS — I70249 Atherosclerosis of native arteries of left leg with ulceration of unspecified site: Secondary | ICD-10-CM | POA: Insufficient documentation

## 2021-10-03 DIAGNOSIS — L97822 Non-pressure chronic ulcer of other part of left lower leg with fat layer exposed: Secondary | ICD-10-CM | POA: Diagnosis not present

## 2021-10-03 DIAGNOSIS — S81812D Laceration without foreign body, left lower leg, subsequent encounter: Secondary | ICD-10-CM | POA: Diagnosis not present

## 2021-10-03 DIAGNOSIS — W228XXD Striking against or struck by other objects, subsequent encounter: Secondary | ICD-10-CM | POA: Insufficient documentation

## 2021-10-03 DIAGNOSIS — R471 Dysarthria and anarthria: Secondary | ICD-10-CM | POA: Insufficient documentation

## 2021-10-03 DIAGNOSIS — I70248 Atherosclerosis of native arteries of left leg with ulceration of other part of lower left leg: Secondary | ICD-10-CM | POA: Diagnosis not present

## 2021-10-04 ENCOUNTER — Encounter: Payer: PPO | Admitting: Speech Pathology

## 2021-10-04 ENCOUNTER — Ambulatory Visit: Payer: PPO | Admitting: Dermatology

## 2021-10-04 DIAGNOSIS — L814 Other melanin hyperpigmentation: Secondary | ICD-10-CM

## 2021-10-04 DIAGNOSIS — Z85820 Personal history of malignant melanoma of skin: Secondary | ICD-10-CM

## 2021-10-04 DIAGNOSIS — Z86006 Personal history of melanoma in-situ: Secondary | ICD-10-CM

## 2021-10-04 DIAGNOSIS — L820 Inflamed seborrheic keratosis: Secondary | ICD-10-CM

## 2021-10-04 DIAGNOSIS — M9902 Segmental and somatic dysfunction of thoracic region: Secondary | ICD-10-CM | POA: Diagnosis not present

## 2021-10-04 DIAGNOSIS — L578 Other skin changes due to chronic exposure to nonionizing radiation: Secondary | ICD-10-CM

## 2021-10-04 DIAGNOSIS — L720 Epidermal cyst: Secondary | ICD-10-CM | POA: Diagnosis not present

## 2021-10-04 DIAGNOSIS — M9903 Segmental and somatic dysfunction of lumbar region: Secondary | ICD-10-CM | POA: Diagnosis not present

## 2021-10-04 DIAGNOSIS — M9901 Segmental and somatic dysfunction of cervical region: Secondary | ICD-10-CM | POA: Diagnosis not present

## 2021-10-04 DIAGNOSIS — Z86018 Personal history of other benign neoplasm: Secondary | ICD-10-CM

## 2021-10-04 DIAGNOSIS — L821 Other seborrheic keratosis: Secondary | ICD-10-CM

## 2021-10-04 DIAGNOSIS — Z1283 Encounter for screening for malignant neoplasm of skin: Secondary | ICD-10-CM

## 2021-10-04 DIAGNOSIS — I87311 Chronic venous hypertension (idiopathic) with ulcer of right lower extremity: Secondary | ICD-10-CM | POA: Diagnosis not present

## 2021-10-04 DIAGNOSIS — D1800 Hemangioma unspecified site: Secondary | ICD-10-CM

## 2021-10-04 DIAGNOSIS — D692 Other nonthrombocytopenic purpura: Secondary | ICD-10-CM

## 2021-10-04 DIAGNOSIS — M9904 Segmental and somatic dysfunction of sacral region: Secondary | ICD-10-CM | POA: Diagnosis not present

## 2021-10-04 NOTE — Progress Notes (Signed)
[name], [name] ([id number]) Visit Report for 10/03/2021 Allergy List Details Patient Name: Theodore [name], Theodore [name] Date of Service: 10/03/2021 8:45 AM Medical Record Number: [id number] Patient Account Number: [id number] Date of Birth/Sex: Oct 14, 1929 (88 y.o. M) Treating RN: [name] Primary Care Kechia Yahnke: [name] Other Clinician: Referring Jaquanda Wickersham: [name] Treating Jeson Camacho/Extender: Ricard Dillon Weeks in Treatment: 0 Allergies Active Allergies penicillin Allergy Notes Electronic Signature(s) Signed: 10/04/2021 3:54:37 PM By: [name] RN Entered By: [name] on 10/03/2021 09:09:37 [name], Theodore Sheehan ([id number]) -------------------------------------------------------------------------------- Arrival Information Details Patient Name: Theodore [name] Date of Service: 10/03/2021 8:45 AM Medical Record Number: [id number] Patient Account Number: [id number] Date of Birth/Sex: 1930/02/11 (88 y.o. M) Treating RN: [name] Primary Care Yesmin Mutch: [name] Other Clinician: Referring Maylene Crocker: [name] Treating Sayge Salvato/Extender: Tito Dine in Treatment: 0 Visit Information Patient Arrived: Lyndel Pleasure Time: 09:03 Accompanied By: self Transfer Assistance: None Patient Identification Verified: Yes Secondary Verification Process Completed: Yes Patient Requires Transmission-Based No Precautions: Patient Has Alerts: Yes Patient Alerts: ABI L .49 TBI .47 09/30/21 ABI R .61 TBI .38 09/30/21 Electronic Signature(s) Signed: 10/04/2021 3:54:37 PM By: [name] RN Entered By: [name] on 10/03/2021 09:22:11 [name], Theodore Loni Muse ([id number]) -------------------------------------------------------------------------------- Clinic Level of Care Assessment Details Patient Name: Theodore [name] Date of Service: 10/03/2021 8:45 AM Medical Record Number: [id number] Patient Account Number: [id number] Date of Birth/Sex: 1929/03/25 (88 y.o. M) Treating RN: [name] Primary Care Lexander Tremblay: [name] Other Clinician: Referring Glorian Mcdonell: [name] Treating Ceilidh Torregrossa/Extender: Tito Dine in Treatment: 0 Clinic Level of Care Assessment Items TOOL 1 Quantity Score X - Use when EandM and Procedure is performed on INITIAL visit 1 0 ASSESSMENTS - Nursing Assessment / Reassessment []  - General Physical Exam (combine w/ comprehensive assessment (listed just below) when performed on new 0 pt. evals) []  - 0 Comprehensive Assessment (HX, ROS, Risk Assessments, Wounds Hx, etc.) ASSESSMENTS - Wound and Skin Assessment / Reassessment []  - Dermatologic / Skin Assessment (not related to wound area) 0 ASSESSMENTS - Ostomy and/or Continence Assessment and Care []  - Incontinence Assessment and Management 0 []  - 0 Ostomy Care Assessment and Management (repouching, etc.) PROCESS - Coordination of Care X - Simple Patient / Family Education for ongoing care 1 15 []  - 0 Complex (extensive) Patient / Family Education for ongoing care X- 1 10 Staff obtains Programmer, systems, Records, Test Results / Process Orders []  - 0 Staff telephones HHA, Nursing Homes / Clarify orders / etc []  - 0 Routine Transfer to another Facility (non-emergent condition) []  - 0 Routine Hospital Admission (non-emergent condition) X- 1 15 New Admissions / Biomedical engineer / Ordering NPWT, Apligraf, etc. []  - 0 Emergency Hospital Admission (emergent condition) PROCESS - Special Needs []  - Pediatric / Minor Patient Management 0 []  - 0 Isolation Patient Management []  - 0 Hearing / Language / Visual special needs []  - 0 Assessment of Community assistance (transportation, D/C planning, etc.) []  - 0 Additional assistance / Altered mentation []  - 0 Support Surface(s) Assessment (bed, cushion, seat, etc.) INTERVENTIONS - Miscellaneous []  - External ear exam 0 []  - 0 Patient Transfer (multiple staff / Civil Service fast streamer / Similar devices) []  - 0 Simple Staple / Suture  removal (25 or less) []  - 0 Complex Staple / Suture removal (26 or more) []  - 0 Hypo/Hyperglycemic Management (do not check if billed separately) []  - 0 Ankle / Brachial Index (ABI) - do not check if billed separately Has the patient been seen at  the hospital within the last three years: Yes Total Score: 40 Level Of Care: New/Established - Level 2 [name], Theodore A. ([id number]) Electronic Signature(s) Signed: 10/04/2021 3:54:37 PM By: [name] RN Entered By: [name] on 10/03/2021 09:33:42 [name], Theodore Loni Muse ([id number]) -------------------------------------------------------------------------------- Encounter Discharge Information Details Patient Name: Theodore [name] Date of Service: 10/03/2021 8:45 AM Medical Record Number: [id number] Patient Account Number: [id number] Date of Birth/Sex: Jul 15, 1929 (88 y.o. M) Treating RN: [name] Primary Care Rea Reser: [name] Other Clinician: Referring Mishael Haran: [name] Treating Makih Stefanko/Extender: Tito Dine in Treatment: 0 Encounter Discharge Information Items Post Procedure Vitals Discharge Condition: Stable Temperature (F): 97.8 Ambulatory Status: Cane Pulse (bpm): 64 Discharge Destination: Home Respiratory Rate (breaths/min): 18 Transportation: Private Auto Blood Pressure (mmHg): 129/63 Accompanied By: self Schedule Follow-up Appointment: Yes Clinical Summary of Care: Electronic Signature(s) Signed: 10/04/2021 3:54:37 PM By: [name] RN Entered By: [name] on 10/03/2021 09:34:39 [name], Theodore Sheehan ([id number]) -------------------------------------------------------------------------------- Lower Extremity Assessment Details Patient Name: Theodore [name] Date of Service: 10/03/2021 8:45 AM Medical Record Number: [id number] Patient Account Number: [id number] Date of Birth/Sex: 12/22/1929 (88 y.o. M) Treating RN: [name] Primary Care Devontay Celaya: [name] Other Clinician: Referring Nichole Keltner:  [name] Treating Prairie Stenberg/Extender: Tito Dine in Treatment: 0 Edema Assessment Assessed: [Left: No] [Right: No] Edema: [Left: N] [Right: o] Calf Left: Right: Point of Measurement: 36 cm From Medial Instep 30 cm Ankle Left: Right: Point of Measurement: 12 cm From Medial Instep 20 cm Knee To Floor Left: Right: From Medial Instep 48 cm Vascular Assessment Pulses: Dorsalis Pedis Palpable: [Left:Yes] Electronic Signature(s) Signed: 10/04/2021 3:54:37 PM By: [name] RN Entered By: [name] on 10/03/2021 09:21:06 Baik, Dade AMarland Kitchen ([id number]) -------------------------------------------------------------------------------- Multi Wound Chart Details Patient Name: Theodore [name] Date of Service: 10/03/2021 8:45 AM Medical Record Number: [id number] Patient Account Number: [id number] Date of Birth/Sex: August 22, 1929 (88 y.o. M) Treating RN: [name] Primary Care Sidra Oldfield: [name] Other Clinician: Referring Rashod Gougeon: [name] Treating Lillah Standre/Extender: Tito Dine in Treatment: 0 Vital Signs Height(in): 75 Pulse(bpm): 2 Weight(lbs): 170 Blood Pressure(mmHg): 129/63 Body Mass Index(BMI): 21.2 Temperature(F): 97.8 Respiratory Rate(breaths/min): 18 Photos: [N/A:N/A] Wound Location: Left, Anterior Lower Leg N/A N/A Wounding Event: Trauma N/A N/A Primary Etiology: Arterial Insufficiency Ulcer N/A N/A Comorbid History: Peripheral Arterial Disease N/A N/A Date Acquired: 07/11/2021 N/A N/A Weeks of Treatment: 0 N/A N/A Wound Status: Open N/A N/A Wound Recurrence: No N/A N/A Measurements L x W x D (cm) 0.3x0.2x0.1 N/A N/A Area (cm) : 0.047 N/A N/A Volume (cm) : 0.005 N/A N/A Classification: Full Thickness Without Exposed N/A N/A Support Structures Exudate Amount: Medium N/A N/A Exudate Type: Serosanguineous N/A N/A Exudate Color: red, brown N/A N/A Granulation Amount: None Present (0%) N/A N/A Necrotic Amount: Large (67-100%)  N/A N/A Exposed Structures: Fascia: No N/A N/A Fat Layer (Subcutaneous Tissue): No Tendon: No Muscle: No Joint: No Bone: No Epithelialization: None N/A N/A Debridement: Debridement - Selective/Open N/A N/A Wound Pre-procedure Verification/Time 09:29 N/A N/A Out Taken: Tissue Debrided: Slough N/A N/A Level: Non-Viable Tissue N/A N/A Debridement Area (sq cm): 0.06 N/A N/A Instrument: Curette N/A N/A Bleeding: Minimum N/A N/A Hemostasis Achieved: Pressure N/A N/A Procedural Pain: 5 N/A N/A Post Procedural Pain: 0 N/A N/A Debridement Treatment Procedure was tolerated well N/A N/A Response: Post Debridement 0.3x0.2x0.1 N/A N/A Measurements L x W x D (cm) 0.005 N/A N/A Lofland, Jada A. ([id number]) Post Debridement Volume: (cm) Procedures Performed: Debridement N/A N/A Treatment Notes Wound #1 (  Lower Leg) Wound Laterality: Left, Anterior Cleanser Byram Ancillary Kit - 15 Day Supply Discharge Instruction: Use supplies as instructed; Kit contains: (15) Saline Bullets; (15) 3x3 Gauze; 15 pr Gloves Wound Cleanser Discharge Instruction: Wash your hands with soap and water. Remove old dressing, discard into plastic bag and place into trash. Cleanse the wound with Wound Cleanser prior to applying a clean dressing using gauze sponges, not tissues or cotton balls. Do not scrub or use excessive force. Pat dry using gauze sponges, not tissue or cotton balls. Peri-Wound Care Topical Primary Dressing Prisma 4.34 (in) Discharge Instruction: Moisten w/normal saline or sterile water; Cover wound as directed. Do not remove from wound bed. Secondary Dressing (SILICONE BORDER) Zetuvit Plus SILICONE BORDER Dressing 4x4 (in/in) Discharge Instruction: Please do not put silicone bordered dressings under wraps. Use non-bordered dressing only. Secured With Compression Wrap Compression Stockings Environmental education officer) Signed: 10/03/2021 4:17:57 PM By: Linton Ham MD Entered By:  Linton Ham on 10/03/2021 09:44:08 Stanczyk, Theodore Sheehan ([id number]) -------------------------------------------------------------------------------- Multi-Disciplinary Care Plan Details Patient Name: Theodore [name] Date of Service: 10/03/2021 8:45 AM Medical Record Number: [id number] Patient Account Number: [id number] Date of Birth/Sex: 12-28-1929 (88 y.o. M) Treating RN: [name] Primary Care Deakin Lacek: [name] Other Clinician: Referring Jarrin Staley: [name] Treating Vickey Boak/Extender: Tito Dine in Treatment: 0 Active Inactive Wound/Skin Impairment Nursing Diagnoses: Knowledge deficit related to ulceration/compromised skin integrity Goals: Patient/caregiver will verbalize understanding of skin care regimen Date Initiated: 10/03/2021 Target Resolution Date: 11/03/2021 Goal Status: Active Ulcer/skin breakdown will have a volume reduction of 30% by week 4 Date Initiated: 10/03/2021 Target Resolution Date: 11/03/2021 Goal Status: Active Ulcer/skin breakdown will have a volume reduction of 50% by week 8 Date Initiated: 10/03/2021 Target Resolution Date: 12/04/2021 Goal Status: Active Ulcer/skin breakdown will have a volume reduction of 80% by week 12 Date Initiated: 10/03/2021 Target Resolution Date: 01/03/2022 Goal Status: Active Ulcer/skin breakdown will heal within 14 weeks Date Initiated: 10/03/2021 Target Resolution Date: 02/03/2022 Goal Status: Active Interventions: Assess patient/caregiver ability to obtain necessary supplies Assess patient/caregiver ability to perform ulcer/skin care regimen upon admission and as needed Assess ulceration(s) every visit Notes: Electronic Signature(s) Signed: 10/04/2021 3:54:37 PM By: [name] RN Entered By: [name] on 10/03/2021 09:26:51 Lonerock, Luan Loni Muse ([id number]) -------------------------------------------------------------------------------- Pain Assessment Details Patient Name: Theodore [name] Date of  Service: 10/03/2021 8:45 AM Medical Record Number: [id number] Patient Account Number: [id number] Date of Birth/Sex: 1929-03-20 (88 y.o. M) Treating RN: [name] Primary Care Itzamara Casas: [name] Other Clinician: Referring Daphane Odekirk: [name] Treating Eufelia Veno/Extender: Tito Dine in Treatment: 0 Active Problems Location of Pain Severity and Description of Pain Patient Has Paino No Site Locations Pain Management and Medication Current Pain Management: Electronic Signature(s) Signed: 10/04/2021 3:54:37 PM By: [name] RN Entered By: [name] on 10/03/2021 09:08:49 Crate, Theodore Sheehan ([id number]) -------------------------------------------------------------------------------- Patient/Caregiver Education Details Patient Name: Theodore [name] Date of Service: 10/03/2021 8:45 AM Medical Record Number: [id number] Patient Account Number: [id number] Date of Birth/Gender: Jun 27, 1929 (88 y.o. M) Treating RN: [name] Primary Care Physician: [name] Other Clinician: Referring Physician: [name] Treating Physician/Extender: Tito Dine in Treatment: 0 Education Assessment Education Provided To: Patient Education Topics Provided Wound/Skin Impairment: Methods: Explain/Verbal Responses: State content correctly Electronic Signature(s) Signed: 10/04/2021 3:54:37 PM By: [name] RN Entered By: [name] on 10/03/2021 09:33:56 Rozman, Edie Loni Muse ([id number]) -------------------------------------------------------------------------------- Wound Assessment Details Patient Name: Theodore [name] Date of Service: 10/03/2021 8:45 AM Medical Record Number: [id number] Patient Account  Number: [id number] Date of Birth/Sex: Feb 15, 1930 (88 y.o. M) Treating RN: [name] Primary Care Cerita Rabelo: [name] Other Clinician: Referring Carmencita Cusic: [name] Treating Grazia Taffe/Extender: Tito Dine in Treatment: 0 Wound Status Wound  Number: 1 Primary Etiology: Arterial Insufficiency Ulcer Wound Location: Left, Anterior Lower Leg Wound Status: Open Wounding Event: Trauma Comorbid History: Peripheral Arterial Disease Date Acquired: 07/11/2021 Weeks Of Treatment: 0 Clustered Wound: No Photos Wound Measurements Length: (cm) 0.3 Width: (cm) 0.2 Depth: (cm) 0.1 Area: (cm) 0.047 Volume: (cm) 0.005 % Reduction in Area: % Reduction in Volume: Epithelialization: None Tunneling: No Undermining: No Wound Description Classification: Full Thickness Without Exposed Support Structures Exudate Amount: Medium Exudate Type: Serosanguineous Exudate Color: red, brown Foul Odor After Cleansing: No Slough/Fibrino Yes Wound Bed Granulation Amount: None Present (0%) Exposed Structure Necrotic Amount: Large (67-100%) Fascia Exposed: No Necrotic Quality: Adherent Slough Fat Layer (Subcutaneous Tissue) Exposed: No Tendon Exposed: No Muscle Exposed: No Joint Exposed: No Bone Exposed: No Treatment Notes Wound #1 (Lower Leg) Wound Laterality: Left, Anterior Cleanser Byram Ancillary Kit - 15 Day Supply Discharge Instruction: Use supplies as instructed; Kit contains: (15) Saline Bullets; (15) 3x3 Gauze; 15 pr Gloves Wound Cleanser Discharge Instruction: Wash your hands with soap and water. Remove old dressing, discard into plastic bag and place into trash. Cleanse the wound with Wound Cleanser prior to applying a clean dressing using gauze sponges, not tissues or cotton balls. Do not Cullen, Emmert A. ([id number]) scrub or use excessive force. Pat dry using gauze sponges, not tissue or cotton balls. Peri-Wound Care Topical Primary Dressing Prisma 4.34 (in) Discharge Instruction: Moisten w/normal saline or sterile water; Cover wound as directed. Do not remove from wound bed. Secondary Dressing (SILICONE BORDER) Zetuvit Plus SILICONE BORDER Dressing 4x4 (in/in) Discharge Instruction: Please do not put silicone bordered  dressings under wraps. Use non-bordered dressing only. Secured With Compression Wrap Compression Stockings Environmental education officer) Signed: 10/04/2021 3:54:37 PM By: [name] RN Entered By: [name] on 10/03/2021 09:19:57 Rathe, Haden Loni Muse ([id number]) -------------------------------------------------------------------------------- Vitals Details Patient Name: Theodore [name] Date of Service: 10/03/2021 8:45 AM Medical Record Number: [id number] Patient Account Number: [id number] Date of Birth/Sex: [birth year] (88 y.o. M) Treating RN: [name] Primary Care Chalsey Leeth: [name] Other Clinician: Referring Vala Raffo: [name] Treating Nautia Lem/Extender: Tito Dine in Treatment: 0 Vital Signs Time Taken: 09:08 Temperature (F): 97.8 Height (in): 75 Pulse (bpm): 64 Source: Stated Respiratory Rate (breaths/min): 18 Weight (lbs): 170 Blood Pressure (mmHg): 129/63 Source: Stated Reference Range: 80 - 120 mg / dl Body Mass Index (BMI): 21.2 Electronic Signature(s) Signed: 10/04/2021 3:54:37 PM By: [name] RN Entered By: [name] on 10/03/2021 09:09:19

## 2021-10-04 NOTE — Progress Notes (Signed)
DONZELL, COLLER ([id number]) Visit Report for 10/03/2021 Abuse Risk Screen Details Patient Name: Theodore [name], Theodore [name] Date of Service: 10/03/2021 8:45 AM Medical Record Number: [id number] Patient Account Number: [id number] Date of Birth/Sex: 1929/11/25 (88 y.o. M) Treating RN: [name] Primary Care Janyia Guion: [name] Other Clinician: Referring Neeti Knudtson: [name] Treating Lj Miyamoto/Extender: Tito Dine in Treatment: 0 Abuse Risk Screen Items Answer ABUSE RISK SCREEN: Has anyone close to you tried to hurt or harm you recentlyo No Do you feel uncomfortable with anyone in your familyo No Has anyone forced you do things that you didnot want to doo No Electronic Signature(s) Signed: 10/04/2021 3:54:37 PM By: [name] RN Entered By: [name] on 10/03/2021 09:12:05 Njie, Takao AMarland Kitchen ([id number]) -------------------------------------------------------------------------------- Activities of Daily Living Details Patient Name: Theodore [name] Date of Service: 10/03/2021 8:45 AM Medical Record Number: [id number] Patient Account Number: [id number] Date of Birth/Sex: 31-Jan-1930 (88 y.o. M) Treating RN: [name] Primary Care Aldona Bryner: [name] Other Clinician: Referring Latravia Southgate: [name] Treating Francelia Mclaren/Extender: Tito Dine in Treatment: 0 Activities of Daily Living Items Answer Activities of Daily Living (Please select one for each item) Drive Automobile Completely Able Take Medications Completely Able Use Telephone Completely Able Care for Appearance Completely Able Use Toilet Completely Able Bath / Shower Completely Able Dress Self Completely Able Feed Self Completely Able Walk Completely Able Get In / Out Bed Completely Able Housework Completely Able Prepare Meals Completely Able Handle Money Completely Able Shop for Self Completely Able Electronic Signature(s) Signed: 10/04/2021 3:54:37 PM By: [name] RN Entered By: [name] on 10/03/2021 09:12:42 Howington, [name] ([id number]) -------------------------------------------------------------------------------- Education Screening Details Patient Name: Theodore [name] Date of Service: 10/03/2021 8:45 AM Medical Record Number: [id number] Patient Account Number: [id number] Date of Birth/Sex: April 21, 1929 (88 y.o. M) Treating RN: [name] Primary Care Marvelous Bouwens: [name] Other Clinician: Referring Ryatt Corsino: [name] Treating Devin Foskey/Extender: Tito Dine in Treatment: 0 Primary Learner Assessed: Patient Learning Preferences/Education Level/Primary Language Learning Preference: Explanation Highest Education Level: College or Above Preferred Language: English Cognitive Barrier Language Barrier: No Translator Needed: No Memory Deficit: No Emotional Barrier: No Cultural/Religious Beliefs Affecting Medical Care: No Physical Barrier Impaired Vision: Yes Glasses Impaired Hearing: Yes Hearing Aid Decreased Hand dexterity: No Knowledge/Comprehension Knowledge Level: Medium Comprehension Level: High Ability to understand written instructions: High Ability to understand verbal instructions: High Motivation Anxiety Level: Anxious Cooperation: Cooperative Education Importance: Acknowledges Need Interest in Health Problems: Asks Questions Perception: Coherent Willingness to Engage in Self-Management High Activities: Readiness to Engage in Self-Management High Activities: Electronic Signature(s) Signed: 10/04/2021 3:54:37 PM By: [name] RN Entered By: [name] on 10/03/2021 09:13:34 Julius, [name] ([id number]) -------------------------------------------------------------------------------- Fall Risk Assessment Details Patient Name: Theodore [name] Date of Service: 10/03/2021 8:45 AM Medical Record Number: [id number] Patient Account Number: [id number] Date of Birth/Sex: [birth year] (88 y.o. M) Treating RN: [name] Primary Care Carlia Bomkamp: [name] Other Clinician: Referring Manish Ruggiero: [name] Treating Breylen Agyeman/Extender: Tito Dine in Treatment: 0 Fall Risk Assessment Items Have you had 2 or more falls in the last 12 monthso 0 No Have you had any fall that resulted in injury in the last 12 monthso 0 No FALLS RISK SCREEN History of falling - immediate or within 3 months 0 No Secondary diagnosis (Do you have 2 or more medical diagnoseso) 0 No Ambulatory aid None/bed rest/wheelchair/nurse 0 No Crutches/cane/walker 0 No Furniture 0 No Intravenous therapy Access/Saline/Heparin Lock 0 No Gait/Transferring Normal/ bed rest/  wheelchair 0 No Weak (short steps with or without shuffle, stooped but able to lift head while walking, may 0 No seek support from furniture) Impaired (short steps with shuffle, may have difficulty arising from chair, head down, impaired 0 No balance) Mental Status Oriented to own ability 0 No Electronic Signature(s) Signed: 10/04/2021 3:54:37 PM By: [name] RN Entered By: [name] on 10/03/2021 09:13:39 Perlow, [name] ([id number]) -------------------------------------------------------------------------------- Foot Assessment Details Patient Name: Theodore [name] Date of Service: 10/03/2021 8:45 AM Medical Record Number: [id number] Patient Account Number: [id number] Date of Birth/Sex: [birth year] (88 y.o. M) Treating RN: [name] Primary Care Glender Augusta: [name] Other Clinician: Referring Kyasia Steuck: [name] Treating Sabra Sessler/Extender: Tito Dine in Treatment: 0 Foot Assessment Items Site Locations + = Sensation present, - = Sensation absent, C = Callus, U = Ulcer R = Redness, W = Warmth, M = Maceration, PU = Pre-ulcerative lesion F = Fissure, S = Swelling, D = Dryness Assessment Right: Left: Other Deformity: No No Prior Foot Ulcer: No No Prior Amputation: No No Charcot Joint: No No Ambulatory Status:  Ambulatory Without Help Gait: Steady Electronic Signature(s) Signed: 10/04/2021 3:54:37 PM By: [name] RN Entered By: [name] on 10/03/2021 09:18:04 Kluth, Lattie Loni Muse ([id number]) -------------------------------------------------------------------------------- Nutrition Risk Screening Details Patient Name: Theodore [name] Date of Service: 10/03/2021 8:45 AM Medical Record Number: [id number] Patient Account Number: [id number] Date of Birth/Sex: 10-Dec-1929 (88 y.o. M) Treating RN: [name] Primary Care Hadiya Spoerl: [name] Other Clinician: Referring Toran Murch: [name] Treating Kamerin Axford/Extender: Tito Dine in Treatment: 0 Height (in): 75 Weight (lbs): 170 Body Mass Index (BMI): 21.2 Nutrition Risk Screening Items Score Screening NUTRITION RISK SCREEN: I have an illness or condition that made me change the kind and/or amount of food I eat 0 No I eat fewer than two meals per day 0 No I eat few fruits and vegetables, or milk products 0 No I have three or more drinks of beer, liquor or wine almost every day 0 No I have tooth or mouth problems that make it hard for me to eat 0 No I don't always have enough money to buy the food I need 0 No I eat alone most of the time 0 No I take three or more different prescribed or over-the-counter drugs a day 1 Yes Without wanting to, I have lost or gained 10 pounds in the last six months 0 No I am not always physically able to shop, cook and/or feed myself 0 No Nutrition Protocols Good Risk Protocol Moderate Risk Protocol High Risk Proctocol Risk Level: Good Risk Score: 1 Electronic Signature(s) Signed: 10/04/2021 3:54:37 PM By: [name] RN Entered By: [name] on 10/03/2021 09:14:02

## 2021-10-04 NOTE — Patient Instructions (Addendum)
Cryotherapy Aftercare  Wash gently with soap and water everyday.   Apply Vaseline and Band-Aid daily until healed.     Due to recent changes in healthcare laws, you may see results of your pathology and/or laboratory studies on MyChart before the doctors have had a chance to review them. We understand that in some cases there may be results that are confusing or concerning to you. Please understand that not all results are received at the same time and often the doctors may need to interpret multiple results in order to provide you with the best plan of care or course of treatment. Therefore, we ask that you please give us 2 business days to thoroughly review all your results before contacting the office for clarification. Should we see a critical lab result, you will be contacted sooner.   If You Need Anything After Your Visit  If you have any questions or concerns for your doctor, please call our main line at [phone] and press option 4 to reach your doctor's medical assistant. If no one answers, please leave a voicemail as directed and we will return your call as soon as possible. Messages left after 4 pm will be answered the following business day.   You may also send us a message via MyChart. We typically respond to MyChart messages within 1-2 business days.  For prescription refills, please ask your pharmacy to contact our office. Our fax number is [phone].  If you have an urgent issue when the clinic is closed that cannot wait until the next business day, you can page your doctor at the number below.    Please note that while we do our best to be available for urgent issues outside of office hours, we are not available 24/7.   If you have an urgent issue and are unable to reach us, you may choose to seek medical care at your doctor's office, retail clinic, urgent care center, or emergency room.  If you have a medical emergency, please immediately call 911 or go to the  emergency department.  Pager Numbers  - Dr. Kowalski: [phone]  - Dr. Moye: [phone]  - Dr. Stewart: [phone]  In the event of inclement weather, please call our main line at [phone] for an update on the status of any delays or closures.  Dermatology Medication Tips: Please keep the boxes that topical medications come in in order to help keep track of the instructions about where and how to use these. Pharmacies typically print the medication instructions only on the boxes and not directly on the medication tubes.   If your medication is too expensive, please contact our office at [phone] option 4 or send us a message through MyChart.   We are unable to tell what your co-pay for medications will be in advance as this is different depending on your insurance coverage. However, we may be able to find a substitute medication at lower cost or fill out paperwork to get insurance to cover a needed medication.   If a prior authorization is required to get your medication covered by your insurance company, please allow us 1-2 business days to complete this process.  Drug prices often vary depending on where the prescription is filled and some pharmacies may offer cheaper prices.  The website www.goodrx.com contains coupons for medications through different pharmacies. The prices here do not account for what the cost may be with help from insurance (it may be cheaper with your insurance), but the website can   give you the price if you did not use any insurance.  - You can print the associated coupon and take it with your prescription to the pharmacy.  - You may also stop by our office during regular business hours and pick up a GoodRx coupon card.  - If you need your prescription sent electronically to a different pharmacy, notify our office through [facility] MyChart or by phone at [phone] option 4.     Si Usted Necesita Algo Despus de Su Visita  Tambin puede  enviarnos un mensaje a travs de MyChart. Por lo general respondemos a los mensajes de MyChart en el transcurso de 1 a 2 das hbiles.  Para renovar recetas, por favor pida a su farmacia que se ponga en contacto con nuestra oficina. Nuestro nmero de fax es el [phone].  Si tiene un asunto urgente cuando la clnica est cerrada y que no puede esperar hasta el siguiente da hbil, puede llamar/localizar a su doctor(a) al nmero que aparece a continuacin.   Por favor, tenga en cuenta que aunque hacemos todo lo posible para estar disponibles para asuntos urgentes fuera del horario de oficina, no estamos disponibles las 24 horas del da, los 7 das de la semana.   Si tiene un problema urgente y no puede comunicarse con nosotros, puede optar por buscar atencin mdica  en el consultorio de su doctor(a), en una clnica privada, en un centro de atencin urgente o en una sala de emergencias.  Si tiene una emergencia mdica, por favor llame inmediatamente al 911 o vaya a la sala de emergencias.  Nmeros de bper  - Dr. Kowalski: [phone]  - Dra. Moye: [phone]  - Dra. Stewart: [phone]  En caso de inclemencias del tiempo, por favor llame a nuestra lnea principal al [phone] para una actualizacin sobre el estado de cualquier retraso o cierre.  Consejos para la medicacin en dermatologa: Por favor, guarde las cajas en las que vienen los medicamentos de uso tpico para ayudarle a seguir las instrucciones sobre dnde y cmo usarlos. Las farmacias generalmente imprimen las instrucciones del medicamento slo en las cajas y no directamente en los tubos del medicamento.   Si su medicamento es muy caro, por favor, pngase en contacto con nuestra oficina llamando al [phone] y presione la opcin 4 o envenos un mensaje a travs de MyChart.   No podemos decirle cul ser su copago por los medicamentos por adelantado ya que esto es diferente dependiendo de la cobertura de su seguro.  Sin embargo, es posible que podamos encontrar un medicamento sustituto a menor costo o llenar un formulario para que el seguro cubra el medicamento que se considera necesario.   Si se requiere una autorizacin previa para que su compaa de seguros cubra su medicamento, por favor permtanos de 1 a 2 das hbiles para completar este proceso.  Los precios de los medicamentos varan con frecuencia dependiendo del lugar de dnde se surte la receta y alguna farmacias pueden ofrecer precios ms baratos.  El sitio web www.goodrx.com tiene cupones para medicamentos de diferentes farmacias. Los precios aqu no tienen en cuenta lo que podra costar con la ayuda del seguro (puede ser ms barato con su seguro), pero el sitio web puede darle el precio si no utiliz ningn seguro.  - Puede imprimir el cupn correspondiente y llevarlo con su receta a la farmacia.  - Tambin puede pasar por nuestra oficina durante el horario de atencin regular y recoger una tarjeta de cupones de GoodRx.  -   Si necesita que su receta se enve electrnicamente a una farmacia diferente, informe a nuestra oficina a travs de MyChart de [place] o por telfono llamando al [phone] y presione la opcin 4.  

## 2021-10-04 NOTE — Progress Notes (Signed)
Follow-Up Visit   Subjective  Theodore [name] is a 88 y.o. male who presents for the following: Follow-up.  The patient presents for Upper Body Skin Exam (UBSE) for skin cancer screening and mole check.  The patient has spots, moles and lesions to be evaluated, some may be new or changing and the patient has concerns that these could be cancer. Recheck ISK treated on the right occipital scalp. Also, patient has itchy spots on his back and arms and itching near Mohs site on the left jaw. History of melanoma, melanoma in situ, Aks, dysplastic nevi.   The following portions of the chart were reviewed this encounter and updated as appropriate:       Review of Systems:  No other skin or systemic complaints except as noted in HPI or Assessment and Plan.  Objective  Well appearing patient in no apparent distress; mood and affect are within normal limits.  All skin waist up examined.  L mandible x 1, L preauricular x 1 (residual), R wrist x 1, R forearm x 1, L upper back x 2, L forearm x 1, L forehead x 2 (9) Erythematous stuck-on, waxy papules  Left Upper Back Subcutaneous nodule 1.5 cm  Scalp Waxy tan speckled patch    Assessment & Plan  Skin cancer screening performed today.  Actinic Damage - chronic, secondary to cumulative UV radiation exposure/sun exposure over time - diffuse scaly erythematous macules with underlying dyspigmentation - Recommend daily broad spectrum sunscreen SPF 30+ to sun-exposed areas, reapply every 2 hours as needed.  - Recommend staying in the shade or wearing long sleeves, sun glasses (UVA+UVB protection) and wide brim hats (4-inch brim around the entire circumference of the hat). - Call for new or changing lesions.  History of Melanoma - Treated in the 90's - No evidence of recurrence today of the right ear - Recommend regular full body skin exams - Recommend daily broad spectrum sunscreen SPF 30+ to sun-exposed areas, reapply every 2 hours as  needed.  - Call if any new or changing lesions are noted between office visits  History of Melanoma in Situ - 2015 - No evidence of recurrence today of the left jaw - Recommend regular full body skin exams - Recommend daily broad spectrum sunscreen SPF 30+ to sun-exposed areas, reapply every 2 hours as needed.  - Call if any new or changing lesions are noted between office visits  History of Dysplastic Nevi - No evidence of recurrence today of the left anterior shoulder, right lower sternum - Recommend regular full body skin exams - Recommend daily broad spectrum sunscreen SPF 30+ to sun-exposed areas, reapply every 2 hours as needed.  - Call if any new or changing lesions are noted between office visits  Seborrheic Keratoses - Stuck-on, waxy, tan-brown papules and/or plaques  - Benign-appearing - Discussed benign etiology and prognosis. - Observe - Call for any changes  Purpura - Chronic; persistent and recurrent.  Treatable, but not curable. - Violaceous macules and patches of the arms - Benign - Related to trauma, age, sun damage and/or use of blood thinners, chronic use of topical and/or oral steroids - Observe - Can use OTC arnica containing moisturizer such as Dermend Bruise Formula if desired - Call for worsening or other concerns  Hemangiomas - Red papules - Discussed benign nature - Observe - Call for any changes  Lentigines - Scattered tan macules - Due to sun exposure - Benign-appering, observe - Recommend daily broad spectrum sunscreen SPF 30+  to sun-exposed areas, reapply every 2 hours as needed. - Call for any changes  Inflamed seborrheic keratosis (9) L mandible x 1, L preauricular x 1 (residual), R wrist x 1, R forearm x 1, L upper back x 2, L forearm x 1, L forehead x 2  Symptomatic, irritating, patient would like treated.  Destruction of lesion - L mandible x 1, L preauricular x 1 (residual), R wrist x 1, R forearm x 1, L upper back x 2, L forearm x  1, L forehead x 2  Destruction method: cryotherapy   Informed consent: discussed and consent obtained   Lesion destroyed using liquid nitrogen: Yes   Region frozen until ice ball extended beyond lesion: Yes   Outcome: patient tolerated procedure well with no complications   Post-procedure details: wound care instructions given   Additional details:  Prior to procedure, discussed risks of blister formation, small wound, skin dyspigmentation, or rare scar following cryotherapy. Recommend Vaseline ointment to treated areas while healing.   Epidermal inclusion cyst Left Upper Back  Benign-appearing. Exam most consistent with an epidermal inclusion cyst. Discussed that a cyst is a benign growth that can grow over time and sometimes get irritated or inflamed. Recommend observation if it is not bothersome. Discussed option of surgical excision to remove it if it is growing, symptomatic, or other changes noted. Please call for new or changing lesions so they can be evaluated.    Seborrheic keratosis Scalp  Bx proven, stable compared to photo 03/01/2020  Benign-appearing.  Observation.  Call clinic for new or changing lesions.  Recommend daily use of broad spectrum spf 30+ sunscreen to sun-exposed areas.   Return in about 6 months (around 04/06/2022) for UBSE, Hx melanoma, Hx AKs.  IJamesetta Orleans, CMA, am acting as scribe for [name], MD .  Documentation: I have reviewed the above documentation for accuracy and completeness, and I agree with the above.  [name] MD

## 2021-10-04 NOTE — Progress Notes (Signed)
ODAS, OZER ([id number]) Visit Report for 10/03/2021 Chief Complaint Document Details Patient Name: Theodore [name], Theodore [name] Date of Service: 10/03/2021 8:45 AM Medical Record Number: [id number] Patient Account Number: [id number] Date of Birth/Sex: 1929-11-06 (88 y.o. M) Treating RN: [name] Primary Care Provider: [name] Other Clinician: Referring Provider: [name] Treating Provider/Extender: Tito Dine in Treatment: 0 Information Obtained from: Patient Chief Complaint 7/24; patient is here for review of what was a traumatic wound on the left anterior lower leg obtained 3 months ago Electronic Signature(s) Signed: 10/03/2021 4:17:57 PM By: Linton Ham MD Entered By: Linton Ham on 10/03/2021 09:44:53 Reierson, Theodore [name] ([id number]) -------------------------------------------------------------------------------- Debridement Details Patient Name: Theodore [name] Date of Service: 10/03/2021 8:45 AM Medical Record Number: [id number] Patient Account Number: [id number] Date of Birth/Sex: 05-26-1929 (88 y.o. M) Treating RN: [name] Primary Care Provider: [name] Other Clinician: Referring Provider: [name] Treating Provider/Extender: Tito Dine in Treatment: 0 Debridement Performed for Wound #1 Left,Anterior Lower Leg Assessment: Performed By: Physician Ricard Dillon, MD Debridement Type: Debridement Severity of Tissue Pre Debridement: Fat layer exposed Level of Consciousness (Pre- Awake and Alert procedure): Pre-procedure Verification/Time Out Yes - 09:29 Taken: Start Time: 09:29 Total Area Debrided (L x W): 0.3 (cm) x 0.2 (cm) = 0.06 (cm) Tissue and other material Viable, Non-Viable, Slough, Slough debrided: Level: Non-Viable Tissue Debridement Description: Selective/Open Wound Instrument: Curette Bleeding: Minimum Hemostasis Achieved: Pressure End Time: 09:31 Procedural Pain: 5 Post Procedural Pain: 0 Response  to Treatment: Procedure was tolerated well Level of Consciousness (Post- Awake and Alert procedure): Post Debridement Measurements of Total Wound Length: (cm) 0.3 Width: (cm) 0.2 Depth: (cm) 0.1 Volume: (cm) 0.005 Character of Wound/Ulcer Post Debridement: Improved Severity of Tissue Post Debridement: Fat layer exposed Post Procedure Diagnosis Same as Pre-procedure Electronic Signature(s) Signed: 10/03/2021 4:17:57 PM By: Linton Ham MD Signed: 10/04/2021 3:54:37 PM By: [name] RN Entered By: Linton Ham on 10/03/2021 09:44:17 Dowdle, Theodore [name] ([id number]) -------------------------------------------------------------------------------- HPI Details Patient Name: Theodore [name] Date of Service: 10/03/2021 8:45 AM Medical Record Number: [id number] Patient Account Number: [id number] Date of Birth/Sex: Feb 29, 1930 (88 y.o. M) Treating RN: [name] Primary Care Provider: [name] Other Clinician: Referring Provider: [name] Treating Provider/Extender: Tito Dine in Treatment: 0 History of Present Illness HPI Description: ADMISSION 10/03/2021 This is a 88 year old man who was pulling a cart 3 months ago he had some sort of imbalance issue hitting his left anterior lower leg. He is not sure exactly what he hit. He is able to show me photos on his phone that show a laceration on the left anterior lower leg in an inverted L-shaped and. He initially did very little to this himself. Saw his primary doctor on 09/21/2021 [place] instructed him to put Vaseline gauze on this which he did for short period of time. He says that things are getting a lot smaller and are improving he has not really been treating this with anything. The other major problem the patient has is peripheral arterial disease. He is followed by vein and vascular and has had a series of follow-up arterial studies. Most recently this was done on 09/30/2021 showing on the left and ABI of 0.49  with a great toe pressure of 0.47 and monophasic waveforms. This is somewhat worse than test he had done on 03/21/2021 at which time the ABI was 0.68 TBI of 0.51. I think because of his age and the fact he has underlying stage IIIa chronic  kidney disease and the fact the wound is healing they have not been scheduling him for anything aggressive but follow-up studies are apparently booked for 58-month Past medical history includes coronary artery disease, lumbar radiculopathy, COPD which is limiting and stage IIIa chronic renal failure. He has PAD as noted. He also tells me that he had a surgical procedure for essential tremor which left him with some mild dysarthria and gait and balance problems Electronic Signature(s) Signed: 10/03/2021 4:17:57 PM By: Theodore [name] Entered By: Theodore Hamon 10/03/2021 09:48:44 Conly, Theodore [name]([id number] -------------------------------------------------------------------------------- Physical Exam Details Patient Name: SAlma FriendlyDate of Service: 10/03/2021 8:45 AM Medical Record Number: [id number] Account Number: [id number] of Birth/Sex: [birth year](88 y.o. M) Treating RN: ECarlene CoriaPrimary Care Provider: TDeborra MedinaOther Clinician: Referring Provider: TDeborra MedinaTreating Provider/Extender: RTito Dinein Treatment: 0 Constitutional Sitting or standing Blood Pressure is within target range for patient.. Pulse regular and within target range for patient..Marland KitchenRespirations regular, non- labored and within target range.. Temperature is normal and within the target range for the patient..Marland Kitchenappears in no distress. Cardiovascular Femoral pulses palpable on the left but I could not feel his popliteal. There is a faint dorsalis pedis but not a posterior tibial.. No edema. Notes Wound exam; the patient has only a very tiny remanent of a wound area on the left anterior lower extremity. Small linear slough filled wound area.  This is considerably better than the original laceration he is able to show me on his phone. I used a #3 curette to remove the slough to a clean base. There is no evidence of infection. Electronic Signature(s) Signed: 10/03/2021 4:17:57 PM By: Theodore [name] Entered By: Theodore Hamon 10/03/2021 09:52:32 Wohler, Theodore [name]([id number] -------------------------------------------------------------------------------- Physician Orders Details Patient Name: SAlma FriendlyDate of Service: 10/03/2021 8:45 AM Medical Record Number: [id number] Account Number: [id number] of Birth/Sex: [birth year](88 y.o. M) Treating RN: ECarlene CoriaPrimary Care Provider: TDeborra MedinaOther Clinician: Referring Provider: TDeborra MedinaTreating Provider/Extender: RTito Dinein Treatment: 0 Verbal / Phone Orders: No Diagnosis Coding Follow-up Appointments o Return Appointment in 1 week. Bathing/ Shower/ Hygiene o May shower; gently cleanse wound with antibacterial soap, rinse and pat dry prior to dressing wounds Edema Control - Lymphedema / Segmental Compressive Device / Other o Elevate, Exercise Daily and Avoid Standing for Long Periods of Time. o Elevate legs to the level of the heart and pump ankles as often as possible o Elevate leg(s) parallel to the floor when sitting. Wound Treatment Wound #1 - Lower Leg Wound Laterality: Left, Anterior Cleanser: Byram Ancillary Kit - 15 Day Supply (DME) (Generic) 3 x Per Week/30 Days Discharge Instructions: Use supplies as instructed; Kit contains: (15) Saline Bullets; (15) 3x3 Gauze; 15 pr Gloves Cleanser: Wound Cleanser 3 x Per Week/30 Days Discharge Instructions: Wash your hands with soap and water. Remove old dressing, discard into plastic bag and place into trash. Cleanse the wound with Wound Cleanser prior to applying a clean dressing using gauze sponges, not tissues or cotton balls. Do not scrub or use excessive force. Pat  dry using gauze sponges, not tissue or cotton balls. Topical: K Y jelly 3 x Per Week/30 Days Discharge Instructions: apply yo wound bed Primary Dressing: Prisma 4.34 (in) 3 x Per Week/30 Days Discharge Instructions: Moisten w/normal saline Secondary Dressing: (SILICONE BORDER) Zetuvit Plus SILICONE BORDER Dressing 4x4 (in/in) (DME) (Generic) 3 x Per Week/30 Days Discharge Instructions:  Please do not put silicone bordered dressings under wraps. Use non-bordered dressing only. Electronic Signature(s) Signed: 10/03/2021 4:17:57 PM By: Linton Ham MD Signed: 10/04/2021 3:54:37 PM By: [name] RN Entered By: [name] on 10/03/2021 09:38:44 Aguillard, Neale Loni Muse ([id number]) -------------------------------------------------------------------------------- Problem List Details Patient Name: Theodore [name] Date of Service: 10/03/2021 8:45 AM Medical Record Number: [id number] Patient Account Number: [id number] Date of Birth/Sex: 1929-10-06 (88 y.o. M) Treating RN: [name] Primary Care Provider: [name] Other Clinician: Referring Provider: [name] Treating Provider/Extender: Tito Dine in Treatment: 0 Active Problems ICD-10 Encounter Code Description Active Date MDM Diagnosis S81.812D Laceration without foreign body, left lower leg, subsequent encounter 10/03/2021 No Yes L97.828 Non-pressure chronic ulcer of other part of left lower leg with other 10/03/2021 No Yes specified severity I70.249 Atherosclerosis of native arteries of left leg with ulceration of unspecified 10/03/2021 No Yes site Inactive Problems Resolved Problems Electronic Signature(s) Signed: 10/03/2021 4:17:57 PM By: Linton Ham MD Entered By: Linton Ham on 10/03/2021 09:44:02 Hammar, Theodore [name] ([id number]) -------------------------------------------------------------------------------- Progress Note Details Patient Name: Theodore [name] Date of Service: 10/03/2021 8:45 AM Medical Record  Number: [id number] Patient Account Number: [id number] Date of Birth/Sex: [birth year] (88 y.o. M) Treating RN: [name] Primary Care Provider: [name] Other Clinician: Referring Provider: [name] Treating Provider/Extender: Tito Dine in Treatment: 0 Subjective Chief Complaint Information obtained from Patient 7/24; patient is here for review of what was a traumatic wound on the left anterior lower leg obtained 3 months ago History of Present Illness (HPI) ADMISSION 10/03/2021 This is a 88 year old man who was pulling a cart 3 months ago he had some sort of imbalance issue hitting his left anterior lower leg. He is not sure exactly what he hit. He is able to show me photos on his phone that show a laceration on the left anterior lower leg in an inverted L-shaped and. He initially did very little to this himself. Saw his primary doctor on 09/21/2021 [place] instructed him to put Vaseline gauze on this which he did for short period of time. He says that things are getting a lot smaller and are improving he has not really been treating this with anything. The other major problem the patient has is peripheral arterial disease. He is followed by vein and vascular and has had a series of follow-up arterial studies. Most recently this was done on 09/30/2021 showing on the left and ABI of 0.49 with a great toe pressure of 0.47 and monophasic waveforms. This is somewhat worse than test he had done on 03/21/2021 at which time the ABI was 0.68 TBI of 0.51. I think because of his age and the fact he has underlying stage IIIa chronic kidney disease and the fact the wound is healing they have not been scheduling him for anything aggressive but follow-up studies are apparently booked for 45-month Past medical history includes coronary artery disease, lumbar radiculopathy, COPD which is limiting and stage IIIa chronic renal failure. He has PAD as noted. He also tells me that he had a  surgical procedure for essential tremor which left him with some mild dysarthria and gait and balance problems Patient History Allergies penicillin Social History Never smoker, Marital Status - Married, Alcohol Use - Never, Drug Use - No History, Caffeine Use - Never. Medical History Cardiovascular Patient has history of Peripheral Arterial Disease Neurologic Denies history of Dementia Review of Systems (ROS) Eyes Complains or has symptoms of Glasses / Contacts. Denies complaints or  symptoms of Dry Eyes. Integumentary (Skin) Complains or has symptoms of Wounds. Objective Constitutional Sitting or standing Blood Pressure is within target range for patient.. Pulse regular and within target range for patient.[name] Respirations regular, non- labored and within target range.. Temperature is normal and within the target range for the patient.[name] appears in no distress. Vitals Time Taken: 9:08 AM, Height: 75 in, Source: Stated, Weight: 170 lbs, Source: Stated, BMI: 21.2, Temperature: 97.8 F, Pulse: 64 bpm, Respiratory Rate: 18 breaths/min, Blood Pressure: 129/63 mmHg. Trindade, Cable A. ([id number]) Cardiovascular Femoral pulses palpable on the left but I could not feel his popliteal. There is a faint dorsalis pedis but not a posterior tibial.. No edema. General Notes: Wound exam; the patient has only a very tiny remanent of a wound area on the left anterior lower extremity. Small linear slough filled wound area. This is considerably better than the original laceration he is able to show me on his phone. I used a #3 curette to remove the slough to a clean base. There is no evidence of infection. Integumentary (Hair, Skin) Wound #1 status is Open. Original cause of wound was Trauma. The date acquired was: 07/11/2021. The wound is located on the Left,Anterior Lower Leg. The wound measures 0.3cm length x 0.2cm width x 0.1cm depth; 0.047cm^2 area and 0.005cm^3 volume. There is no tunneling or undermining  noted. There is a medium amount of serosanguineous drainage noted. There is no granulation within the wound bed. There is a large (67-100%) amount of necrotic tissue within the wound bed including Adherent Slough. 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 There was a Selective/Open Wound Non-Viable Tissue Debridement with a total area of 0.06 sq cm performed by Ricard Dillon, MD. With the following instrument(s): Curette to remove Viable and Non-Viable tissue/material. Material removed includes [place]. No specimens were taken. A time out was conducted at 09:29, prior to the start of the procedure. A Minimum amount of bleeding was controlled with Pressure. The procedure was tolerated well with a pain level of 5 throughout and a pain level of 0 following the procedure. Post Debridement Measurements: 0.3cm length x 0.2cm width x 0.1cm depth; 0.005cm^3 volume. Character of Wound/Ulcer Post Debridement is improved. Severity of Tissue Post Debridement is: Fat layer exposed. Post procedure Diagnosis Wound #1: Same as Pre-Procedure Plan Follow-up Appointments: Return Appointment in 1 week. Bathing/ Shower/ Hygiene: May shower; gently cleanse wound with antibacterial soap, rinse and pat dry prior to dressing wounds Edema Control - Lymphedema / Segmental Compressive Device / Other: Elevate, Exercise Daily and Avoid Standing for Long Periods of Time. Elevate legs to the level of the heart and pump ankles as often as possible Elevate leg(s) parallel to the floor when sitting. WOUND  #1: - Lower Leg Wound Laterality: Left, Anterior Cleanser: Byram Ancillary Kit - 15 Day Supply (DME) (Generic) 3 x Per Week/30 Days Discharge Instructions: Use supplies as instructed; Kit contains: (15) Saline Bullets; (15) 3x3 Gauze; 15 pr Gloves Cleanser: Wound Cleanser 3 x Per Week/30 Days Discharge Instructions: Wash your hands with soap and water. Remove old dressing, discard into plastic bag and place into trash. Cleanse the wound  with Wound Cleanser prior to applying a clean dressing using gauze sponges, not tissues or cotton balls. Do not scrub or use excessive force. Pat dry using gauze sponges, not tissue or cotton balls. Topical: K Y jelly 3 x Per Week/30 Days Discharge Instructions: apply yo wound bed Primary Dressing: Prisma 4.34 (in) 3 x Per Week/30 Days Discharge Instructions: Moisten w/normal saline Secondary Dressing: (SILICONE BORDER) Zetuvit Plus SILICONE BORDER Dressing 4x4 (in/in) (DME) (Generic) 3 x Per Week/30 Days Discharge Instructions: Please do not put silicone bordered dressings under wraps. Use non-bordered dressing only. Scalisi, Legrande A. ([id number]) 1. We will use silver collagen moistened with [place] for hydrogel change 3 times a week. 2. Small wound but with about a millimeter or 2 of depth hopefully this will fill it 3. The patient has very significant PAD with limiting claudication historically [about the distance from our parking lot into our lobby]. Nevertheless the wound has improved quite dramatically in the last 3 months. He would be at high risk for contrast Electronic Signature(s) Signed: 10/03/2021 4:17:57 PM By: Linton Ham MD Entered By: Linton Ham on 10/03/2021 09:53:45 Maker, Theodore [name] ([id number]) -------------------------------------------------------------------------------- ROS/PFSH Details Patient Name: Theodore [name] Date of Service: 10/03/2021 8:45 AM Medical Record Number: [id number] Patient Account Number: [id number] Date of  Birth/Sex: [birth year] (88 y.o. M) Treating RN: [name] Primary Care Provider: [name] Other Clinician: Referring Provider: [name] Treating Provider/Extender: Tito Dine in Treatment: 0 Eyes Complaints and Symptoms: Positive for: Glasses / Contacts Negative for: Dry Eyes Integumentary (Skin) Complaints and Symptoms: Positive for: Wounds Cardiovascular Medical History: Positive for: Peripheral Arterial Disease Neurologic Medical History: Negative for: Dementia Immunizations Pneumococcal Vaccine: Received Pneumococcal Vaccination: No Implantable Devices None Family and Social History Never smoker; Marital Status - Married; Alcohol Use: Never; Drug Use: No History; Caffeine Use: Never Electronic Signature(s) Signed: 10/03/2021 4:17:57 PM By: Linton Ham MD Signed: 10/04/2021 3:54:37 PM By: [name] RN Entered By: [name] on 10/03/2021 09:11:59 Carras, Theodore [name] ([id number]) -------------------------------------------------------------------------------- SuperBill Details Patient Name: Theodore [name] Date of Service: 10/03/2021 Medical Record Number: [id number] Patient Account Number: [id number] Date of Birth/Sex: 10-25-1929 (88 y.o. 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 Facility Procedures CPT4 Code: [id number] Description: [phone] - WOUND CARE VISIT-LEV 2 EST PT Modifier: Quantity: 1 CPT4 Code: [id number] Description: 01093 - DEBRIDE WOUND 1ST 20 SQ CM OR < Modifier: Quantity:  1 CPT4 Code: Description: ICD-10 Diagnosis Description L97.828 Non-pressure chronic ulcer of other part of left lower leg with other speci Modifier: fied severity Quantity: Physician Procedures CPT4 Code: 2355732 Description: WC PHYS LEVEL 3 o NEW PT Modifier: 25 Quantity: 1 CPT4 Code: Description: ICD-10 Diagnosis Description S81.812D Laceration without foreign body, left lower leg, subsequent encounter L97.828 Non-pressure chronic ulcer of other part of left lower leg with other speci I70.249 Atherosclerosis of native arteries of  left leg with ulceration of unspecifi Modifier: fied severity ed site Quantity: CPT4 Code: 2025427 Description: 06237 - WC PHYS DEBR WO ANESTH 20 SQ CM Modifier: Quantity: 1 CPT4 Code: Description: ICD-10 Diagnosis Description L97.828 Non-pressure chronic ulcer of other part of left lower leg with other  speci Modifier: fied severity Quantity: Electronic Signature(s) Signed: 10/03/2021 4:17:57 PM By: Linton Ham MD Entered By: Linton Ham on 10/03/2021 09:54:17

## 2021-10-06 ENCOUNTER — Encounter: Payer: PPO | Admitting: Speech Pathology

## 2021-10-06 DIAGNOSIS — M9901 Segmental and somatic dysfunction of cervical region: Secondary | ICD-10-CM | POA: Diagnosis not present

## 2021-10-06 DIAGNOSIS — M9903 Segmental and somatic dysfunction of lumbar region: Secondary | ICD-10-CM | POA: Diagnosis not present

## 2021-10-06 DIAGNOSIS — M9902 Segmental and somatic dysfunction of thoracic region: Secondary | ICD-10-CM | POA: Diagnosis not present

## 2021-10-06 DIAGNOSIS — M9904 Segmental and somatic dysfunction of sacral region: Secondary | ICD-10-CM | POA: Diagnosis not present

## 2021-10-10 ENCOUNTER — Encounter: Payer: PPO | Admitting: Physician Assistant

## 2021-10-10 DIAGNOSIS — S81812D Laceration without foreign body, left lower leg, subsequent encounter: Secondary | ICD-10-CM | POA: Diagnosis not present

## 2021-10-10 DIAGNOSIS — L97822 Non-pressure chronic ulcer of other part of left lower leg with fat layer exposed: Secondary | ICD-10-CM | POA: Diagnosis not present

## 2021-10-10 DIAGNOSIS — I70248 Atherosclerosis of native arteries of left leg with ulceration of other part of lower left leg: Secondary | ICD-10-CM | POA: Diagnosis not present

## 2021-10-10 NOTE — Progress Notes (Signed)
[name], [name] ([id number]) Visit Report for 10/10/2021 Chief Complaint Document Details Patient Name: Theodore [name], Theodore [name] Date of Service: 10/10/2021 9:00 AM Medical Record Number: [id number] Patient Account Number: [id number] Date of Birth/Sex: 06/27/1929 (88 y.o. M) Treating RN: Cornell Barman Primary Care Provider: [name] Other Clinician: Massie Kluver Referring Provider: [name] Treating Provider/Extender: Skipper Cliche in Treatment: 1 Information Obtained from: Patient Chief Complaint 7/24; patient is here for review of what was a traumatic wound on the left anterior lower leg obtained 3 months ago Electronic Signature(s) Signed: 10/10/2021 9:28:08 AM By: Worthy Keeler PA-C Entered By: Worthy Keeler on 10/10/2021 09:28:08 [name], [name] -------------------------------------------------------------------------------- Problem List Details Patient Name: Theodore [name] Date of Service: 10/10/2021 9:00 AM Medical Record Number: [id number] Patient Account Number: [id number] Date of Birth/Sex: [birth year] (88 y.o. M) Treating RN: Cornell Barman Primary Care Provider: [name] Other Clinician: Massie Kluver Referring Provider: [name] Treating Provider/Extender: Skipper Cliche in Treatment: 1 Active Problems ICD-10 Encounter Code Description Active Date MDM Diagnosis S81.812D Laceration without foreign body, left lower leg, subsequent encounter 10/03/2021 No Yes L97.828 Non-pressure chronic ulcer of other part of left lower leg with other 10/03/2021 No Yes specified severity I70.249 Atherosclerosis of native arteries of left leg with ulceration of unspecified 10/03/2021 No Yes site Inactive Problems Resolved Problems Electronic Signature(s) Signed: 10/10/2021 9:28:03 AM By: Worthy Keeler PA-C Entered By: Worthy Keeler on 10/10/2021 09:28:03

## 2021-10-11 ENCOUNTER — Encounter: Payer: PPO | Admitting: Speech Pathology

## 2021-10-11 DIAGNOSIS — M9904 Segmental and somatic dysfunction of sacral region: Secondary | ICD-10-CM | POA: Diagnosis not present

## 2021-10-11 DIAGNOSIS — M9903 Segmental and somatic dysfunction of lumbar region: Secondary | ICD-10-CM | POA: Diagnosis not present

## 2021-10-11 DIAGNOSIS — M9902 Segmental and somatic dysfunction of thoracic region: Secondary | ICD-10-CM | POA: Diagnosis not present

## 2021-10-11 DIAGNOSIS — M9901 Segmental and somatic dysfunction of cervical region: Secondary | ICD-10-CM | POA: Diagnosis not present

## 2021-10-11 NOTE — Progress Notes (Signed)
Theodore [name] ([id number]) Visit Report for 10/10/2021 Arrival Information Details Patient Name: Theodore [name] Date of Service: 10/10/2021 9:00 AM Medical Record Number: [id number] Patient Account Number: [id number] Date of Birth/Sex: [birth year] (88 y.o. M) Treating RN: Cornell Barman Primary Care Shaquetta Arcos: [name] Other Clinician: Massie Kluver Referring Yazleemar Strassner: [name] Treating Timaya Bojarski/Extender: Skipper Cliche in Treatment: 1 Visit Information History Since Last Visit All ordered tests and consults were completed: No Patient Arrived: Theodore [name] Added or deleted any medications: No Arrival Time: 09:15 Any new allergies or adverse reactions: No Transfer Assistance: None Had a fall or experienced change in No Patient Requires Transmission-Based No activities of daily living that may affect Precautions: risk of falls: Patient Has Alerts: Yes Hospitalized since last visit: No Patient Alerts: ABI L .49 TBI .47 Pain Present Now: No 09/30/21 ABI R .61 TBI .38 09/30/21 Electronic Signature(s) Signed: 10/11/2021 3:08:50 PM By: Massie Kluver Entered By: Massie Kluver on 10/10/2021 09:16:26 Theodore [name] ([id number]) -------------------------------------------------------------------------------- Clinic Level of Care Assessment Details Patient Name: Theodore [name] Date of Service: 10/10/2021 9:00 AM Medical Record Number: [id number] Patient Account Number: [id number] Date of Birth/Sex: [birth year] (88 y.o. M) Treating RN: Cornell Barman Primary Care Francyne Arreaga: [name] Other Clinician: Massie Kluver Referring Tracy Gerken: [name] Treating Juda Lajeunesse/Extender: Skipper Cliche in Treatment: 1 Clinic Level of Care Assessment Items TOOL 1 Quantity Score []  - Use when EandM and Procedure is performed on INITIAL visit 0 ASSESSMENTS - Nursing Assessment / Reassessment []  - General Physical Exam (combine w/ comprehensive assessment (listed just below) when performed on  new 0 pt. evals) []  - 0 Comprehensive Assessment (HX, ROS, Risk Assessments, Wounds Hx, etc.) ASSESSMENTS - Wound and Skin Assessment / Reassessment []  - Dermatologic / Skin Assessment (not related to wound area) 0 ASSESSMENTS - Ostomy and/or Continence Assessment and Care []  - Incontinence Assessment and Management 0 []  - 0 Ostomy Care Assessment and Management (repouching, etc.) PROCESS - Coordination of Care []  - Simple Patient / Family Education for ongoing care 0 []  - 0 Complex (extensive) Patient / Family Education for ongoing care []  - 0 Staff obtains Programmer, systems, Records, Test Results / Process Orders []  - 0 Staff telephones HHA, Nursing Homes / Clarify orders / etc []  - 0 Routine Transfer to another Facility (non-emergent condition) []  - 0 Routine Hospital Admission (non-emergent condition) []  - 0 New Admissions / Biomedical engineer / Ordering NPWT, Apligraf, etc. []  - 0 Emergency Hospital Admission (emergent condition) PROCESS - Special Needs []  - Pediatric / Minor Patient Management 0 []  - 0 Isolation Patient Management []  - 0 Hearing / Language / Visual special needs []  - 0 Assessment of Community assistance (transportation, D/C planning, etc.) []  - 0 Additional assistance / Altered mentation []  - 0 Support Surface(s) Assessment (bed, cushion, seat, etc.) INTERVENTIONS - Miscellaneous []  - External ear exam 0 []  - 0 Patient Transfer (multiple staff / Civil Service fast streamer / Similar devices) []  - 0 Simple Staple / Suture removal (25 or less) []  - 0 Complex Staple / Suture removal (26 or more) []  - 0 Hypo/Hyperglycemic Management (do not check if billed separately) []  - 0 Ankle / Brachial Index (ABI) - do not check if billed separately Has the patient been seen at the hospital within the last three years: Yes Total Score: 0 Level Of Care: ____ Theodore [name] ([id number]) Electronic Signature(s) Signed: 10/11/2021 3:08:50 PM By: Massie Kluver Entered By:  Massie Kluver on 10/10/2021 09:56:32 [name], [name] ([id number]) -------------------------------------------------------------------------------- Encounter Discharge  Information Details Patient Name: Theodore [name] Date of Service: 10/10/2021 9:00 AM Medical Record Number: [id number] Patient Account Number: [id number] Date of Birth/Sex: Feb 01, 1930 (88 y.o. M) Treating RN: Cornell Barman Primary Care Ionna Avis: [name] Other Clinician: Massie Kluver Referring Mackenize Delgadillo: [name] Treating Audie Stayer/Extender: Skipper Cliche in Treatment: 1 Encounter Discharge Information Items Post Procedure Vitals Discharge Condition: Stable Temperature (F): 97.8 Ambulatory Status: Cane Pulse (bpm): 65 Discharge Destination: Home Respiratory Rate (breaths/min): 18 Transportation: Private Auto Blood Pressure (mmHg): 144/69 Accompanied By: self Schedule Follow-up Appointment: Yes Clinical Summary of Care: Electronic Signature(s) Signed: 10/11/2021 3:08:50 PM By: Massie Kluver Entered By: Massie Kluver on 10/10/2021 10:00:04 Theodore [name] ([id number]) -------------------------------------------------------------------------------- Lower Extremity Assessment Details Patient Name: Theodore [name] Date of Service: 10/10/2021 9:00 AM Medical Record Number: [id number] Patient Account Number: [id number] Date of Birth/Sex: Jan 16, 1930 (88 y.o. M) Treating RN: Cornell Barman Primary Care Dafney Farler: [name] Other Clinician: Massie Kluver Referring Orin Eberwein: [name] Treating Zuriyah Shatz/Extender: Jeri Cos Weeks in Treatment: 1 Edema Assessment Assessed: Theodore [name]: Yes] Theodore [name]: No] Edema: [Left: Ye] [Right: s] Calf Left: Right: Point of Measurement: 36 cm From Medial Instep 31.3 cm Ankle Left: Right: Point of Measurement: 12 cm From Medial Instep 22 cm Vascular Assessment Pulses: Dorsalis Pedis Palpable: [Left:Yes] Posterior Tibial Palpable: [Left:Yes] Electronic  Signature(s) Signed: 10/10/2021 5:09:07 PM By: Gretta Cool, BSN, RN, CWS, Kim RN, BSN Signed: 10/11/2021 3:08:50 PM By: Massie Kluver Entered By: Massie Kluver on 10/10/2021 09:25:27 Tomson, Paris AMarland Kitchen ([id number]) -------------------------------------------------------------------------------- Multi Wound Chart Details Patient Name: Theodore [name] Date of Service: 10/10/2021 9:00 AM Medical Record Number: [id number] Patient Account Number: [id number] Date of Birth/Sex: 1929/11/06 (88 y.o. M) Treating RN: Cornell Barman Primary Care Danyela Posas: [name] Other Clinician: Massie Kluver Referring Jabori Henegar: [name] Treating Makelle Marrone/Extender: Skipper Cliche in Treatment: 1 Vital Signs Height(in): 75 Pulse(bpm): 65 Weight(lbs): 170 Blood Pressure(mmHg): 144/69 Body Mass Index(BMI): 21.2 Temperature(F): 97.8 Respiratory Rate(breaths/min): 18 Photos: [N/A:N/A] Wound Location: Left, Anterior Lower Leg N/A N/A Wounding Event: Trauma N/A N/A Primary Etiology: Arterial Insufficiency Ulcer N/A N/A Comorbid History: Peripheral Arterial Disease N/A N/A Date Acquired: 07/11/2021 N/A N/A Weeks of Treatment: 1 N/A N/A Wound Status: Open N/A N/A Wound Recurrence: No N/A N/A Measurements L x W x D (cm) 0.5x0.2x0.1 N/A N/A Area (cm) : 0.079 N/A N/A Volume (cm) : 0.008 N/A N/A % Reduction in Area: -68.10% N/A N/A % Reduction in Volume: -60.00% N/A N/A Classification: Full Thickness Without Exposed N/A N/A Support Structures Exudate Amount: Medium N/A N/A Exudate Type: Serosanguineous N/A N/A Exudate Color: red, brown N/A N/A Granulation Amount: None Present (0%) N/A N/A Necrotic Amount: Large (67-100%) N/A N/A Exposed Structures: Fascia: No N/A N/A Fat Layer (Subcutaneous Tissue): No Tendon: No Muscle: No Joint: No Bone: No Epithelialization: None N/A N/A Treatment Notes Electronic Signature(s) Signed: 10/11/2021 3:08:50 PM By: Massie Kluver Entered By: Massie Kluver on 10/10/2021  09:26:13 Shankel, Quincy [name] ([id number]) -------------------------------------------------------------------------------- Edna Details Patient Name: Theodore [name] Date of Service: 10/10/2021 9:00 AM Medical Record Number: [id number] Patient Account Number: [id number] Date of Birth/Sex: [birth year] (88 y.o. M) Treating RN: Cornell Barman Primary Care Cyera Balboni: [name] Other Clinician: Massie Kluver Referring Frayda Egley: [name] Treating Novaleigh Kohlman/Extender: Skipper Cliche in Treatment: 1 Active Inactive Wound/Skin Impairment Nursing Diagnoses: Knowledge deficit related to ulceration/compromised skin integrity Goals: Patient/caregiver will verbalize understanding of skin care regimen Date Initiated: 10/03/2021 Target Resolution Date: 11/03/2021 Goal Status: Active Ulcer/skin breakdown will have a volume reduction of 30% by week  4 Date Initiated: 10/03/2021 Target Resolution Date: 11/03/2021 Goal Status: Active Ulcer/skin breakdown will have a volume reduction of 50% by week 8 Date Initiated: 10/03/2021 Target Resolution Date: 12/04/2021 Goal Status: Active Ulcer/skin breakdown will have a volume reduction of 80% by week 12 Date Initiated: 10/03/2021 Target Resolution Date: 01/03/2022 Goal Status: Active Ulcer/skin breakdown will heal within 14 weeks Date Initiated: 10/03/2021 Target Resolution Date: 02/03/2022 Goal Status: Active Interventions: Assess patient/caregiver ability to obtain necessary supplies Assess patient/caregiver ability to perform ulcer/skin care regimen upon admission and as needed Assess ulceration(s) every visit Notes: Electronic Signature(s) Signed: 10/10/2021 5:09:07 PM By: Gretta Cool, BSN, RN, CWS, Kim RN, BSN Signed: 10/11/2021 3:08:50 PM By: Massie Kluver Entered By: Massie Kluver on 10/10/2021 09:25:34 Scheel, Haygen AMarland Kitchen ([id number]) -------------------------------------------------------------------------------- Pain Assessment  Details Patient Name: Theodore [name] Date of Service: 10/10/2021 9:00 AM Medical Record Number: [id number] Patient Account Number: [id number] Date of Birth/Sex: [birth year] (88 y.o. M) Treating RN: Cornell Barman Primary Care Ivone Licht: [name] Other Clinician: Massie Kluver Referring Miran Kautzman: [name] Treating Zarin Hagmann/Extender: Skipper Cliche in Treatment: 1 Active Problems Location of Pain Severity and Description of Pain Patient Has Paino No Site Locations Pain Management and Medication Current Pain Management: Electronic Signature(s) Signed: 10/10/2021 5:09:07 PM By: Gretta Cool, BSN, RN, CWS, Kim RN, BSN Signed: 10/11/2021 3:08:50 PM By: Massie Kluver Entered By: Massie Kluver on 10/10/2021 09:19:23 Blankenhorn, Quincy [name] ([id number]) -------------------------------------------------------------------------------- Patient/Caregiver Education Details Patient Name: Theodore [name] Date of Service: 10/10/2021 9:00 AM Medical Record Number: [id number] Patient Account Number: [id number] Date of Birth/Gender: February 14, 1930 (88 y.o. M) Treating RN: Cornell Barman Primary Care Physician: [name] Other Clinician: Massie Kluver Referring Physician: [name] Treating Physician/Extender: Skipper Cliche in Treatment: 1 Education Assessment Education Provided To: Patient Education Topics Provided Wound/Skin Impairment: Handouts: Other: continue wound care as directed Electronic Signature(s) Signed: 10/11/2021 3:08:50 PM By: Massie Kluver Entered By: Massie Kluver on 10/10/2021 09:57:03 Vargo, Daveyon AMarland Kitchen ([id number]) -------------------------------------------------------------------------------- Wound Assessment Details Patient Name: Theodore [name] Date of Service: 10/10/2021 9:00 AM Medical Record Number: [id number] Patient Account Number: [id number] Date of Birth/Sex: [birth year] (88 y.o. M) Treating RN: Cornell Barman Primary Care Najae Rathert: [name] Other Clinician: Massie Kluver Referring Phinneas Shakoor: [name] Treating Cristoval Teall/Extender: Jeri Cos Weeks in Treatment: 1 Wound Status Wound Number: 1 Primary Etiology: Arterial Insufficiency Ulcer Wound Location: Left, Anterior Lower Leg Wound Status: Open Wounding Event: Trauma Comorbid History: Peripheral Arterial Disease Date Acquired: 07/11/2021 Weeks Of Treatment: 1 Clustered Wound: No Photos Wound Measurements Length: (cm) 0.5 Width: (cm) 0.2 Depth: (cm) 0.1 Area: (cm) 0.079 Volume: (cm) 0.008 % Reduction in Area: -68.1% % Reduction in Volume: -60% Epithelialization: None Wound Description Classification: Full Thickness Without Exposed Support Structures Exudate Amount: Medium Exudate Type: Serosanguineous Exudate Color: red, brown Foul Odor After Cleansing: No Slough/Fibrino Yes Wound Bed Granulation Amount: None Present (0%) Exposed Structure Necrotic Amount: Large (67-100%) Fascia Exposed: No Fat Layer (Subcutaneous Tissue) Exposed: No Tendon Exposed: No Muscle Exposed: No Joint Exposed: No Bone Exposed: No Treatment Notes Wound #1 (Lower Leg) Wound Laterality: Left, Anterior Cleanser [place] Ancillary Kit - 15 Day Supply Discharge Instruction: Use supplies as instructed; Kit contains: (15) Saline Bullets; (15) 3x3 Gauze; 15 pr Gloves Wound Cleanser Discharge Instruction: Wash your hands with soap and water. Remove old dressing, discard into plastic bag and place into trash. Cleanse the wound with Wound Cleanser prior to applying a clean dressing using gauze sponges, not tissues or cotton balls. Do not Morado, Jeziah A. ([id number])  scrub or use excessive force. Pat dry using gauze sponges, not tissue or cotton balls. Peri-Wound Care Topical K Y jelly Discharge Instruction: apply to wound bed over prisma Primary Dressing Prisma 4.34 (in) Discharge Instruction: Moisten w/normal saline Secondary Dressing (SILICONE BORDER) [place] Dressing 4x4  (in/in) Discharge Instruction: Please do not put silicone bordered dressings under wraps. Use non-bordered dressing only. Secured With Compression Wrap Compression Stockings Environmental education officer) Signed: 10/10/2021 5:09:07 PM By: Gretta Cool, BSN, RN, CWS, Kim RN, BSN Signed: 10/11/2021 3:08:50 PM By: Massie Kluver Entered By: Massie Kluver on 10/10/2021 09:24:08 Demas, Quincy [name] ([id number]) -------------------------------------------------------------------------------- Vitals Details Patient Name: Theodore [name] Date of Service: 10/10/2021 9:00 AM Medical Record Number: [id number] Patient Account Number: [id number] Date of Birth/Sex: [birth year] (88 y.o. M) Treating RN: Cornell Barman Primary Care Christena Sunderlin: [name] Other Clinician: Massie Kluver Referring Earl Zellmer: [name] Treating Griffon Herberg/Extender: Skipper Cliche in Treatment: 1 Vital Signs Time Taken: 09:16 Temperature (F): 97.8 Height (in): 75 Pulse (bpm): 65 Weight (lbs): 170 Respiratory Rate (breaths/min): 18 Body Mass Index (BMI): 21.2 Blood Pressure (mmHg): 144/69 Reference Range: 80 - 120 mg / dl Electronic Signature(s) Signed: 10/11/2021 3:08:50 PM By: Massie Kluver Entered By: Massie Kluver on 10/10/2021 09:19:01

## 2021-10-13 ENCOUNTER — Encounter: Payer: PPO | Admitting: Speech Pathology

## 2021-10-13 DIAGNOSIS — R296 Repeated falls: Secondary | ICD-10-CM | POA: Diagnosis not present

## 2021-10-13 DIAGNOSIS — M6281 Muscle weakness (generalized): Secondary | ICD-10-CM | POA: Diagnosis not present

## 2021-10-13 DIAGNOSIS — R2681 Unsteadiness on feet: Secondary | ICD-10-CM | POA: Diagnosis not present

## 2021-10-13 DIAGNOSIS — R4789 Other speech disturbances: Secondary | ICD-10-CM | POA: Diagnosis not present

## 2021-10-14 DIAGNOSIS — M9904 Segmental and somatic dysfunction of sacral region: Secondary | ICD-10-CM | POA: Diagnosis not present

## 2021-10-14 DIAGNOSIS — M9901 Segmental and somatic dysfunction of cervical region: Secondary | ICD-10-CM | POA: Diagnosis not present

## 2021-10-14 DIAGNOSIS — M9903 Segmental and somatic dysfunction of lumbar region: Secondary | ICD-10-CM | POA: Diagnosis not present

## 2021-10-14 DIAGNOSIS — M9902 Segmental and somatic dysfunction of thoracic region: Secondary | ICD-10-CM | POA: Diagnosis not present

## 2021-10-16 ENCOUNTER — Encounter (INDEPENDENT_AMBULATORY_CARE_PROVIDER_SITE_OTHER): Payer: Self-pay | Admitting: Nurse Practitioner

## 2021-10-16 NOTE — Progress Notes (Signed)
Subjective:    Patient ID: [name], male    DOB: November 25, 1929, 88 y.o.   MRN: [id number] No chief complaint on file.   The patient is a 88 year old male known to our practice for a lower extremity ulceration that has been slow to heal.  He notes that the wound persisted for several weeks however on presentation today the wound is healed.  Currently the patient denies significant claudication-like symptoms but this is because he begins to have profound shortness of breath with any attempted significant activity.  He denies any rest pain.  The wound on his shin has healed today.  There is just a small shallow scabbing area.  No necrotic tissue noted.  Today the patient has a right ABI 0.61 and a left to 0.49.  This is a decrease from previous studies which showed a right ABI 0.7 and a left of 0.68.  He has monophasic tibial artery waveforms bilaterally with decreased toe waveforms bilaterally    Review of Systems  Skin:  Negative for wound.  All other systems reviewed and are negative.      Objective:   Physical Exam Vitals reviewed.  HENT:     Head: Normocephalic.  Cardiovascular:     Rate and Rhythm: Normal rate.     Pulses:          Dorsalis pedis pulses are detected w/ Doppler on the right side and detected w/ Doppler on the left side.       Posterior tibial pulses are detected w/ Doppler on the right side and detected w/ Doppler on the left side.  Pulmonary:     Effort: Pulmonary effort is normal.  Skin:    General: Skin is warm and dry.  Neurological:     Mental Status: He is alert and oriented to person, place, and time.  Psychiatric:        Mood and Affect: Mood normal.        Behavior: Behavior normal.        Thought Content: Thought content normal.        Judgment: Judgment normal.     BP 136/68 (BP Location: Left Arm)   Pulse 67   Resp 17   Ht '6\' 2"'$  (1.88 m)   Wt 176 lb (79.8 kg)   BMI 22.60 kg/m   Past Medical History:  Diagnosis Date   Actinic  keratosis    Arthritis    knees   Asymptomatic Sinus bradycardia    a. 05/2016 Zio: Avg HR 61 (41-167).   Benign essential tremor    Cancer ([place]) 2002   melanoma right ear   Colon polyp    COPD (chronic obstructive pulmonary disease) ([place])    pt said he believes it is a misdiagnosis    Coronary artery calcification seen on CT scan    a. 06/2016 CTA Chest: cor Ca2+; b. 05/2017 MV: EF >65%. No ischemia/infarct.   Depression    History of echocardiogram    a. 04/2016 Echo: EF 60-65%, mild conc LVH. Nl PASP.   History of kidney stones    Hx of dysplastic nevus 05/07/2017   L anterior shoulder, severe   Hx of dysplastic nevus 09/06/2020   R lower sternum, moderate atypia   Hypothyroidism    Hypothyroidism    Melanoma ([place]) 1990's per pt   R ear   Melanoma in situ ([place]) 01/12/2014   left jaw   Palpitations    a. 05/2016 Zio: Avg HR 61 (  41-167). 9 SVT runs (fastest 167 - 5 beats; longest 8 beats - 101 bpm). Rare PACs/PVCs.   PVC's (premature ventricular contractions)    Rhinitis    Wears dentures    partial upper and lower   Wears hearing aid in both ears     Social History   Socioeconomic History   Marital status: Married    Spouse name: [name]   Number of children: 4   Years of education: 16   Highest education level: Not on file  Occupational History   Occupation: Retired    Comment: Architect -   Tobacco Use   Smoking status: Never   Smokeless tobacco: Never  Vaping Use   Vaping Use: Never used  Substance and Sexual Activity   Alcohol use: Yes    Comment: 1 glass of wine daily   Drug use: No   Sexual activity: Not Currently  Other Topics Concern   Not on file  Social History Narrative   Mr. Janssen grew up in [place], [place]. 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  He and his wife are living at [facility]. He is very active at [facility]. He enjoys music.    Social Determinants of Health   Financial Resource Strain: Low Risk  (08/05/2021)   Overall Financial Resource Strain (CARDIA)    Difficulty of Paying Living Expenses: Not hard at all  Food Insecurity: No Food Insecurity (08/05/2021)   Hunger Vital Sign    Worried About Running Out of Food in the Last Year: Never true    Ran Out of Food in the Last Year: Never true  Transportation Needs: No Transportation Needs (08/05/2021)   PRAPARE - Hydrologist (Medical): No    Lack of Transportation (Non-Medical): No  Physical Activity: Insufficiently Active (08/05/2021)   Exercise Vital Sign    Days of Exercise per Week: 4 days    Minutes of Exercise per Session: 30 min  Stress: No Stress Concern Present (08/05/2021)   [place]    Feeling of Stress : Only a little  Social Connections: Socially Integrated (08/05/2021)   Social Connection and Isolation Panel [NHANES]    Frequency of Communication with Friends and Family: More than three times a week    Frequency of Social Gatherings with Friends and Family: Once a week    Attends Religious Services: 1 to 4 times per year    Active Member of [employer] or Organizations: Yes    Attends Archivist Meetings: More than 4 times per year    Marital Status: Married  Human resources officer Violence: Not At Risk (08/05/2021)   Humiliation, Afraid, Rape, and Kick questionnaire    Fear of Current or Ex-Partner: No    Emotionally Abused: No    Physically Abused: No    Sexually Abused: No    Past Surgical History:  Procedure Laterality Date   ADENOIDECTOMY     CATARACT EXTRACTION W/PHACO Left 10/22/2017   Procedure: CATARACT EXTRACTION PHACO AND INTRAOCULAR LENS PLACEMENT ([place])  LEFT;  Surgeon: Eulogio Bear, MD;  Location: [facility];   Service: Ophthalmology;  Laterality: Left;   CATARACT EXTRACTION W/PHACO Right 11/20/2017   Procedure: CATARACT EXTRACTION PHACO AND INTRAOCULAR LENS PLACEMENT ([facility]) RIGHT;  Surgeon:  Eulogio Bear, MD;  Location: [facility];  Service: Ophthalmology;  Laterality: Right;   ESOPHAGOGASTRODUODENOSCOPY (EGD) WITH PROPOFOL N/A 10/31/2016   Procedure: ESOPHAGOGASTRODUODENOSCOPY (EGD) WITH PROPOFOL;  Surgeon: Lollie Sails, MD;  Location: [facility] ENDOSCOPY;  Service: Endoscopy;  Laterality: N/A;   JOINT REPLACEMENT Right 2003   knee   knee meniscus repair Right    MELANOMA EXCISION Right    ear. Followed by Dr. Nehemiah Massed   PATELLECTOMY Bilateral    TONSILLECTOMY      Family History  Problem Relation Age of Onset   Hypertension Mother    Heart disease Mother        CHF   Heart disease Father    Cancer Sister        melanoma    Allergies  Allergen Reactions   Penicillins Rash and Other (See Comments)    Other reaction(s): Other (see comments) Other reaction(s): UNKNOWN   Lexapro [Escitalopram] Other (See Comments)    Increased hand tremor       Latest Ref Rng & Units 08/26/2021   10:57 AM 01/17/2021    2:57 PM 01/03/2021    1:27 PM  CBC  WBC 4.0 - 10.5 K/uL 6.1  5.0  5.0   Hemoglobin 13.0 - 17.0 g/dL 15.1  14.9  15.2   Hematocrit 39.0 - 52.0 % 44.7  43.8  43.7   Platelets 150.0 - 400.0 K/uL 217.0  243.0  206       CMP     Component Value Date/Time   NA 140 08/26/2021 1057   K 4.4 08/26/2021 1057   CL 103 08/26/2021 1057   CO2 30 08/26/2021 1057   GLUCOSE 72 08/26/2021 1057   BUN 28 (H) 08/26/2021 1057   CREATININE 1.21 08/26/2021 1057   CREATININE 0.92 02/03/2015 1504   CALCIUM 9.4 08/26/2021 1057   PROT 6.2 08/26/2021 1057   ALBUMIN 4.1 08/26/2021 1057   AST 18 08/26/2021 1057   ALT 17 08/26/2021 1057   ALKPHOS 44 08/26/2021 1057   BILITOT 1.4 (H) 08/26/2021 1057   GFRNONAA 50 (L) 01/03/2021 1327   GFRNONAA 77 02/03/2015 1504   GFRAA >60 05/10/2016  0236   GFRAA 89 02/03/2015 1504     VAS [place] ABI WITH/WO TBI  Result Date: 10/10/2021  LOWER EXTREMITY DOPPLER STUDY Patient Name:  AMRIT CRESS  Date of Exam:   09/30/2021 Medical Rec #: [id number]      Accession #:    [id number] Date of Birth: 1929/10/15      Patient Gender: M Patient Age:   75 years Exam Location:  [facility] Procedure:      VAS [place] ABI WITH/WO TBI Referring Phys: Corene Cornea DEW --------------------------------------------------------------------------------  Indications: Peripheral artery disease. High Risk Factors: Hypertension, past history of smoking. Other Factors: Recent fall and injury to left leg.  Performing Technologist: Delorise Shiner RVT  Examination Guidelines: A complete evaluation includes at minimum, Doppler waveform signals and systolic blood pressure reading at the level of bilateral brachial, anterior tibial, and posterior tibial arteries, when vessel segments are accessible. Bilateral testing is considered an integral part of a complete examination. Photoelectric Plethysmograph (PPG) waveforms and toe systolic pressure readings are included as required and additional duplex testing as needed. Limited examinations for reoccurring indications may be performed as noted.  ABI Findings: +---------+------------------+-----+----------+--------+ Right    Rt Pressure (mmHg)IndexWaveform  Comment  +---------+------------------+-----+----------+--------+ Brachial 151                                       +---------+------------------+-----+----------+--------+  ATA      93                0.58 monophasic         +---------+------------------+-----+----------+--------+ PTA      98                0.61 monophasic         +---------+------------------+-----+----------+--------+ Great Toe60                0.38                    +---------+------------------+-----+----------+--------+ +---------+------------------+-----+----------+-------+ Left     Lt  Pressure (mmHg)IndexWaveform  Comment +---------+------------------+-----+----------+-------+ Brachial 160                                      +---------+------------------+-----+----------+-------+ ATA      79                0.49 monophasic        +---------+------------------+-----+----------+-------+ PTA      69                0.43 monophasic        +---------+------------------+-----+----------+-------+ Great Toe75                0.47                   +---------+------------------+-----+----------+-------+ +-------+-----------+-----------+------------+------------+ ABI/TBIToday's ABIToday's TBIPrevious ABIPrevious TBI +-------+-----------+-----------+------------+------------+ Right  0.61       0.60       0.87        0.68         +-------+-----------+-----------+------------+------------+ Left   0.49       0.47       0.68        0.51         +-------+-----------+-----------+------------+------------+  Bilateral ABIs appear decreased compared to prior study on 03/22/2021.  Summary: Right: Resting right ankle-brachial index indicates moderate right lower extremity arterial disease. The right toe-brachial index is abnormal. Left: Resting left ankle-brachial index indicates severe left lower extremity arterial disease. The left toe-brachial index is abnormal. *See table(s) above for measurements and observations.  Electronically signed by Hortencia Pilar MD on 10/10/2021 at 1:03:10 PM.    Final        Assessment & Plan:   1. Non-healing ulcer of lower leg, left, with unspecified severity ([facility]) The patient does have notable evidence of significant peripheral arterial disease however the wound of his left lower leg has healed.  Given the patient's advanced age and the lack of limb threatening symptoms currently we will maintain a very close eye on the patient's peripheral vascular status.  He is advised that if he begins to develop rest pain like symptoms or  develops new wound or ulceration he will likely need to undergo intervention.  In the absence of that we can maintain close follow-up.  The patient understands and is agreeable. - VAS [place] ABI WITH/WO TBI  2. Hyperlipidemia LDL goal <100 Continue statin as ordered and reviewed, no changes at this time    Current Outpatient Medications on File Prior to Visit  Medication Sig Dispense Refill   Alpha-Lipoic Acid 600 MG CAPS Take by mouth.     ascorbic Acid (VITAMIN C) 500 MG CPCR Take by mouth.     buPROPion (WELLBUTRIN XL) 150 MG 24 hr tablet TAKE 1  TABLET BY MOUTH EVERY DAY 90 tablet 1   Cholecalciferol (VITAMIN D3) 25 MCG (1000 UT) CAPS Take 2 capsules (2,000 Units total) by mouth daily. 180 capsule 3   Coenzyme Q10 100 MG capsule Take by mouth.     cycloSPORINE (RESTASIS) 0.05 % ophthalmic emulsion Restasis 0.05 % eye drops in a dropperette  INSTILL 1 DROP INTO BOTH EYES TWICE A DAY     donepezil (ARICEPT) 10 MG tablet Take 10 mg by mouth every morning.     ezetimibe (ZETIA) 10 MG tablet Take 1 tablet (10 mg total) by mouth daily. 90 tablet 3   levothyroxine (SYNTHROID) 100 MCG tablet TAKE 1 TABLET BY MOUTH EVERY DAY BEFORE BREAKFAST 90 tablet 0   magnesium oxide (MAG-OX) 400 MG tablet Take 400 mg by mouth daily.     OVER THE COUNTER MEDICATION Take by mouth daily. Hemp oil     Probiotic Product (PROBIOTIC + TURMERIC EXTRACT) 400 MG CAPS Take by mouth.     Probiotic Product (PROBIOTIC ACIDOPHILUS) CAPS Take 1 capsule by mouth daily.     rosuvastatin (CRESTOR) 10 MG tablet Take 1 tablet (10 mg total) by mouth daily. 90 tablet 1   testosterone cypionate (DEPOTESTOSTERONE CYPIONATE) 200 MG/ML injection INJECT 0.5 ML (100 MG TOTAL) INTO THE MUSCLE EVERY 14 DAYS. 1 mL 5   Turmeric 400 MG CAPS Take by mouth.     vitamin B-12 (CYANOCOBALAMIN) 500 MCG tablet Take 1 tablet by mouth daily.     sertraline (ZOLOFT) 50 MG tablet TAKE 1 TABLET (50 MG TOTAL) BY MOUTH DAILY. WITH A MEAL. (Patient not  taking: Reported on 09/30/2021) 90 tablet 2   No current facility-administered medications on file prior to visit.    There are no Patient Instructions on file for this visit. No follow-ups on file.   Kris Hartmann, NP

## 2021-10-17 ENCOUNTER — Encounter: Payer: PPO | Attending: Physician Assistant | Admitting: Physician Assistant

## 2021-10-17 DIAGNOSIS — S81812D Laceration without foreign body, left lower leg, subsequent encounter: Secondary | ICD-10-CM | POA: Insufficient documentation

## 2021-10-17 DIAGNOSIS — R2689 Other abnormalities of gait and mobility: Secondary | ICD-10-CM | POA: Diagnosis not present

## 2021-10-17 DIAGNOSIS — J449 Chronic obstructive pulmonary disease, unspecified: Secondary | ICD-10-CM | POA: Insufficient documentation

## 2021-10-17 DIAGNOSIS — L97828 Non-pressure chronic ulcer of other part of left lower leg with other specified severity: Secondary | ICD-10-CM | POA: Insufficient documentation

## 2021-10-17 DIAGNOSIS — I70248 Atherosclerosis of native arteries of left leg with ulceration of other part of lower left leg: Secondary | ICD-10-CM | POA: Insufficient documentation

## 2021-10-17 DIAGNOSIS — X58XXXD Exposure to other specified factors, subsequent encounter: Secondary | ICD-10-CM | POA: Insufficient documentation

## 2021-10-17 DIAGNOSIS — N1831 Chronic kidney disease, stage 3a: Secondary | ICD-10-CM | POA: Insufficient documentation

## 2021-10-17 DIAGNOSIS — I2510 Atherosclerotic heart disease of native coronary artery without angina pectoris: Secondary | ICD-10-CM | POA: Insufficient documentation

## 2021-10-17 NOTE — Progress Notes (Addendum)
HARDING, THOMURE ([id number]) Visit Report for 10/17/2021 Chief Complaint Document Details Patient Name: SELAH, ZELMAN Date of Service: 10/17/2021 9:45 AM Medical Record Number: [id number] Patient Account Number: [id number] Date of Birth/Sex: 1929-03-15 (88 y.o. M) Treating RN: Cornell Barman Primary Care Provider: [name] Other Clinician: Massie Kluver Referring Provider: [name] Treating Provider/Extender: Skipper Cliche in Treatment: 2 Information Obtained from: Patient Chief Complaint 7/24; patient is here for review of what was a traumatic wound on the left anterior lower leg obtained 3 months ago Electronic Signature(s) Signed: 10/17/2021 9:44:16 AM By: Worthy Keeler PA-C Entered By: Worthy Keeler on 10/17/2021 09:44:16 Hipple, [name] ([id number]) -------------------------------------------------------------------------------- HPI Details Patient Name: [name] Date of Service: 10/17/2021 9:45 AM Medical Record Number: [id number] Patient Account Number: [id number] Date of Birth/Sex: 05-31-1929 (88 y.o. M) Treating RN: Cornell Barman Primary Care Provider: [name] Other Clinician: Massie Kluver Referring Provider: [name] Treating Provider/Extender: Skipper Cliche in Treatment: 2 History of Present Illness HPI Description: ADMISSION 10/03/2021 This is a 88 year old man who was pulling a cart 3 months ago he had some sort of imbalance issue hitting his left anterior lower leg. He is not sure exactly what he hit. He is able to show me photos on his phone that show a laceration on the left anterior lower leg in an inverted L-shaped and. He initially did very little to this himself. Saw his primary doctor on 09/21/2021 [place] instructed him to put Vaseline gauze on this which he did for short period of time. He says that things are getting a lot smaller and are improving he has not really been treating this with anything. The other major problem the  patient has is peripheral arterial disease. He is followed by vein and vascular and has had a series of follow-up arterial studies. Most recently this was done on 09/30/2021 showing on the left and ABI of 0.49 with a great toe pressure of 0.47 and monophasic waveforms. This is somewhat worse than test he had done on 03/21/2021 at which time the ABI was 0.68 TBI of 0.51. I think because of his age and the fact he has underlying stage IIIa chronic kidney disease and the fact the wound is healing they have not been scheduling him for anything aggressive but follow-up studies are apparently booked for 4-month Past medical history includes coronary artery disease, lumbar radiculopathy, COPD which is limiting and stage IIIa chronic renal failure. He has PAD as noted. He also tells me that he had a surgical procedure for essential tremor which left him with some mild dysarthria and gait and balance problems 10-10-2021 upon evaluation today patient appears to be doing decently well in regard to his wound all things considered. He is very slow to heal due to his low ABIs. With that being said it does appear that he is making some progress nonetheless there is little bit of dry skin and tissue around the edges of the wound I think a very light debridement to clear this away could be of benefit for him. The patient is in agreement with that plan. For that reason I did actually perform debridement to clear that away today but did so extremely carefully. 10-17-2021 upon evaluation today patient appears to be doing well currently in regard to his wound in fact this appears to be completely healed based on what I am seeing today. I do not see any evidence of active infection locally or systemically which is great news. No  fevers, chills, nausea, vomiting, or diarrhea. Electronic Signature(s) Signed: 10/17/2021 10:24:57 AM By: Worthy Keeler PA-C Entered By: Worthy Keeler on 10/17/2021 10:24:57 Shore, [name]  ([id number]) -------------------------------------------------------------------------------- Physical Exam Details Patient Name: [name] Date of Service: 10/17/2021 9:45 AM Medical Record Number: [id number] Patient Account Number: [id number] Date of Birth/Sex: April 27, 1929 (88 y.o. M) Treating RN: Cornell Barman Primary Care Provider: [name] Other Clinician: Massie Kluver Referring Provider: [name] Treating Provider/Extender: Jeri Cos Weeks in Treatment: 2 Constitutional Well-nourished and well-hydrated in no acute distress. Respiratory normal breathing without difficulty. Psychiatric this patient is able to make decisions and demonstrates good insight into disease process. Alert and Oriented x 3. pleasant and cooperative. Notes Upon inspection patient's wound bed actually showed signs of good granulation and epithelization at this point. In fact I do not see anything open and I think that he is actually doing well and ready for discharge today. Electronic Signature(s) Signed: 10/17/2021 10:25:21 AM By: Worthy Keeler PA-C Entered By: Worthy Keeler on 10/17/2021 10:25:21 [name] ([id number]) -------------------------------------------------------------------------------- Physician Orders Details Patient Name: [name] Date of Service: 10/17/2021 9:45 AM Medical Record Number: [id number] Patient Account Number: [id number] Date of Birth/Sex: 07/05/1929 (88 y.o. M) Treating RN: Cornell Barman Primary Care Provider: [name] Other Clinician: Massie Kluver Referring Provider: [name] Treating Provider/Extender: Skipper Cliche in Treatment: 2 Verbal / Phone Orders: No Diagnosis Coding ICD-10 Coding Code Description [phone] Laceration without foreign body, left lower leg, subsequent encounter L97.828 Non-pressure chronic ulcer of other part of left lower leg with other specified severity I70.249 Atherosclerosis of native arteries of left leg  with ulceration of unspecified site Discharge From [place] o Discharge from [place] Treatment Complete - wound healed, please call if any further issues arise o Elevate, Exercise Daily and Avoid Standing for Long Periods of Time. Additional Orders / Instructions o Follow Nutritious Diet and Increase Protein Intake o Other: - keep cover and protected with a border foam bandage (Zetuvit) Electronic Signature(s) Signed: 10/17/2021 4:15:07 PM By: Massie Kluver Signed: 10/17/2021 4:15:14 PM By: Worthy Keeler PA-C Entered By: Massie Kluver on 10/17/2021 10:08:27 Alejandro, [name] ([id number]) -------------------------------------------------------------------------------- Problem List Details Patient Name: [name] Date of Service: 10/17/2021 9:45 AM Medical Record Number: [id number] Patient Account Number: [id number] Date of Birth/Sex: January 01, 1930 (88 y.o. M) Treating RN: Cornell Barman Primary Care Provider: [name] Other Clinician: Massie Kluver Referring Provider: [name] Treating Provider/Extender: Skipper Cliche in Treatment: 2 Active Problems ICD-10 Encounter Code Description Active Date MDM Diagnosis S81.812D Laceration without foreign body, left lower leg, subsequent encounter 10/03/2021 No Yes L97.828 Non-pressure chronic ulcer of other part of left lower leg with other 10/03/2021 No Yes specified severity I70.249 Atherosclerosis of native arteries of left leg with ulceration of unspecified 10/03/2021 No Yes site Inactive Problems Resolved Problems Electronic Signature(s) Signed: 10/17/2021 9:44:12 AM By: Worthy Keeler PA-C Entered By: Worthy Keeler on 10/17/2021 09:44:12 Hollan, Drewey Loni Muse ([id number]) -------------------------------------------------------------------------------- Progress Note Details Patient Name: [name] Date of Service: 10/17/2021 9:45 AM Medical Record Number: [id number] Patient Account Number: [id number] Date  of Birth/Sex: 1929/05/25 (88 y.o. M) Treating RN: Cornell Barman Primary Care Provider: [name] Other Clinician: Massie Kluver Referring Provider: [name] Treating Provider/Extender: Skipper Cliche in Treatment: 2 Subjective Chief Complaint Information obtained from Patient 7/24; patient is here for review of what was a traumatic wound on the left anterior lower leg obtained 3 months ago  History of Present Illness (HPI) ADMISSION 10/03/2021 This is a 88 year old man who was pulling a cart 3 months ago he had some sort of imbalance issue hitting his left anterior lower leg. He is not sure exactly what he hit. He is able to show me photos on his phone that show a laceration on the left anterior lower leg in an inverted L-shaped and. He initially did very little to this himself. Saw his primary doctor on 09/21/2021 [place] instructed him to put Vaseline gauze on this which he did for short period of time. He says that things are getting a lot smaller and are improving he has not really been treating this with anything. The other major problem the patient has is peripheral arterial disease. He is followed by vein and vascular and has had a series of follow-up arterial studies. Most recently this was done on 09/30/2021 showing on the left and ABI of 0.49 with a great toe pressure of 0.47 and monophasic waveforms. This is somewhat worse than test he had done on 03/21/2021 at which time the ABI was 0.68 TBI of 0.51. I think because of his age and the fact he has underlying stage IIIa chronic kidney disease and the fact the wound is healing they have not been scheduling him for anything aggressive but follow-up studies are apparently booked for 61-month Past medical history includes coronary artery disease, lumbar radiculopathy, COPD which is limiting and stage IIIa chronic renal failure. He has PAD as noted. He also tells me that he had a surgical procedure for essential tremor which left him  with some mild dysarthria and gait and balance problems 10-10-2021 upon evaluation today patient appears to be doing decently well in regard to his wound all things considered. He is very slow to heal due to his low ABIs. With that being said it does appear that he is making some progress nonetheless there is little bit of dry skin and tissue around the edges of the wound I think a very light debridement to clear this away could be of benefit for him. The patient is in agreement with that plan. For that reason I did actually perform debridement to clear that away today but did so extremely carefully. 10-17-2021 upon evaluation today patient appears to be doing well currently in regard to his wound in fact this appears to be completely healed based on what I am seeing today. I do not see any evidence of active infection locally or systemically which is great news. No fevers, chills, nausea, vomiting, or diarrhea. Objective Constitutional Well-nourished and well-hydrated in no acute distress. Vitals Time Taken: 9:45 AM, Height: 75 in, Weight: 170 lbs, BMI: 21.2, Temperature: 97.9 F, Pulse: 56 bpm, Respiratory Rate: 18 breaths/min, Blood Pressure: 149/73 mmHg. Respiratory normal breathing without difficulty. Psychiatric this patient is able to make decisions and demonstrates good insight into disease process. Alert and Oriented x 3. pleasant and cooperative. General Notes: Upon inspection patient's wound bed actually showed signs of good granulation and epithelization at this point. In fact I do not see anything open and I think that he is actually doing well and ready for discharge today. Integumentary (Hair, Skin) Wound #1 status is Healed - Epithelialized. Original cause of wound was Trauma. The date acquired was: 07/11/2021. The wound has been in treatment 2 weeks. The wound is located on the Left,Anterior Lower Leg. The wound measures 0cm length x 0cm width x 0cm depth; 0cm^2 area and 0cm^3  volume. There is a none  present amount of drainage noted. There is no granulation within the wound bed. There is no necrotic tissue Bruna, Jamone A. ([id number]) within the wound bed. 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 Additional Orders / Instructions: Follow Nutritious Diet and Increase Protein Intake Other: - keep cover and protected with a border foam bandage (Zetuvit) 1. I would recommend currently that we go ahead and continue with the recommendation for protection over the next week. This will allow for the skin to toughen up. 2. I am also can recommend that we have the patient continue to monitor for any signs of infection. Obviously if he develops any issues with drainage otherwise he will let me know but otherwise I think he just needs to continue with his compression socks which have been doing the best for him currently. We will see patient back for reevaluation in 1 week here in the clinic. If anything worsens or changes patient will contact our office for additional recommendations. Electronic Signature(s) Signed: 10/17/2021 10:25:59 AM By: Worthy Keeler PA-C Entered By: Worthy Keeler on 10/17/2021 10:25:58 Games, [name] ([id number]) -------------------------------------------------------------------------------- SuperBill Details Patient Name: [name] Date of Service: 10/17/2021 Medical Record Number: [id number] Patient Account Number: [id number] Date of Birth/Sex: 1929/04/18 (88 y.o. M) Treating RN: Cornell Barman Primary  Care Provider: [name] Other Clinician: Massie Kluver Referring Provider: [name] Treating Provider/Extender: Skipper Cliche in Treatment: 2 Diagnosis Coding ICD-10 Codes Code Description [id number] Laceration without foreign body, left lower leg, subsequent encounter L97.828 Non-pressure chronic ulcer of other part of left lower leg with other specified severity I70.249 Atherosclerosis of native arteries of left leg with ulceration of unspecified site Facility Procedures CPT4 Code: [id number] Description: [id number] - WOUND CARE VISIT-LEV 2 EST PT Modifier: Quantity: 1 Physician Procedures CPT4 Code: [id number] Description: 88416 - WC PHYS LEVEL 3 - EST PT Modifier: Quantity: 1 CPT4 Code: Description: ICD-10 Diagnosis Description S81.812D Laceration without foreign body, left lower leg, subsequent encounter L97.828 Non-pressure chronic ulcer of other part of left lower leg with other spe I70.249 Atherosclerosis of native arteries of left  leg with ulceration of unspeci Modifier: cified severity fied site Quantity: Electronic Signature(s) Signed: 10/17/2021 10:27:41 AM By: Worthy Keeler PA-C Entered By: Worthy Keeler on 10/17/2021 10:27:41

## 2021-10-17 NOTE — Progress Notes (Signed)
VIMAL, DEREGO ([id number]) Visit Report for 10/17/2021 Arrival Information Details Patient Name: Theodore [name], Theodore [name] Date of Service: 10/17/2021 9:45 AM Medical Record Number: [id number] Patient Account Number: [id number] Date of Birth/Sex: [birth year] (88 y.o. M) Treating RN: Cornell Barman Primary Care Clever Geraldo: [name] Other Clinician: Massie Kluver Referring Georgena Weisheit: [name] Treating Ajanee Buren/Extender: Skipper Cliche in Treatment: 2 Visit Information History Since Last Visit All ordered tests and consults were completed: No Patient Arrived: [name] Added or deleted any medications: No Arrival Time: 09:38 Any new allergies or adverse reactions: No Transfer Assistance: None Had a fall or experienced change in No Patient Requires Transmission-Based No activities of daily living that may affect Precautions: risk of falls: Patient Has Alerts: Yes Hospitalized since last visit: No Patient Alerts: ABI L .49 TBI .47 Pain Present Now: No 09/30/21 ABI R .61 TBI .38 09/30/21 Electronic Signature(s) Unsigned Entered By: Massie Kluver on 10/17/2021 09:44:48 Signature(s): Date(s): Theodore [name] ([id number]) -------------------------------------------------------------------------------- Clinic Level of Care Assessment Details Patient Name: Theodore [name], Theodore [name] Date of Service: 10/17/2021 9:45 AM Medical Record Number: [id number] Patient Account Number: [id number] Date of Birth/Sex: [birth year] (88 y.o. M) Treating RN: Cornell Barman Primary Care Quinterrius Errington: [name] Other Clinician: Massie Kluver Referring Kanav Kazmierczak: [name] Treating Vernelle Wisner/Extender: Skipper Cliche in Treatment: 2 Clinic Level of Care Assessment Items TOOL 4 Quantity Score '[]'$  - Use when only an EandM is performed on FOLLOW-UP visit 0 ASSESSMENTS - Nursing Assessment / Reassessment X - Reassessment of Co-morbidities (includes updates in patient status) 1 10 X- 1 5 Reassessment of Adherence to Treatment  Plan ASSESSMENTS - Wound and Skin Assessment / Reassessment X - Simple Wound Assessment / Reassessment - one wound 1 5 '[]'$  - 0 Complex Wound Assessment / Reassessment - multiple wounds '[]'$  - 0 Dermatologic / Skin Assessment (not related to wound area) ASSESSMENTS - Focused Assessment '[]'$  - Circumferential Edema Measurements - multi extremities 0 '[]'$  - 0 Nutritional Assessment / Counseling / Intervention '[]'$  - 0 Lower Extremity Assessment (monofilament, tuning fork, pulses) '[]'$  - 0 Peripheral Arterial Disease Assessment (using hand held doppler) ASSESSMENTS - Ostomy and/or Continence Assessment and Care '[]'$  - Incontinence Assessment and Management 0 '[]'$  - 0 Ostomy Care Assessment and Management (repouching, etc.) PROCESS - Coordination of Care X - Simple Patient / Family Education for ongoing care 1 15 '[]'$  - 0 Complex (extensive) Patient / Family Education for ongoing care '[]'$  - 0 Staff obtains Programmer, systems, Records, Test Results / Process Orders '[]'$  - 0 Staff telephones HHA, Nursing Homes / Clarify orders / etc '[]'$  - 0 Routine Transfer to another Facility (non-emergent condition) '[]'$  - 0 Routine Hospital Admission (non-emergent condition) '[]'$  - 0 New Admissions / Biomedical engineer / Ordering NPWT, Apligraf, etc. '[]'$  - 0 Emergency Hospital Admission (emergent condition) X- 1 10 Simple Discharge Coordination '[]'$  - 0 Complex (extensive) Discharge Coordination PROCESS - Special Needs '[]'$  - Pediatric / Minor Patient Management 0 '[]'$  - 0 Isolation Patient Management '[]'$  - 0 Hearing / Language / Visual special needs '[]'$  - 0 Assessment of Community assistance (transportation, D/C planning, etc.) '[]'$  - 0 Additional assistance / Altered mentation '[]'$  - 0 Support Surface(s) Assessment (bed, cushion, seat, etc.) INTERVENTIONS - Wound Cleansing / Measurement Steward, Asriel A. ([id number]) X- 1 5 Simple Wound Cleansing - one wound '[]'$  - 0 Complex Wound Cleansing - multiple wounds X- 1 5 Wound  Imaging (photographs - any number of wounds) '[]'$  - 0 Wound Tracing (instead of photographs) '[]'$  - 0 Simple Wound  Measurement - one wound '[]'$  - 0 Complex Wound Measurement - multiple wounds INTERVENTIONS - Wound Dressings X - Small Wound Dressing one or multiple wounds 1 10 '[]'$  - 0 Medium Wound Dressing one or multiple wounds '[]'$  - 0 Large Wound Dressing one or multiple wounds '[]'$  - 0 Application of Medications - topical '[]'$  - 0 Application of Medications - injection INTERVENTIONS - Miscellaneous '[]'$  - External ear exam 0 '[]'$  - 0 Specimen Collection (cultures, biopsies, blood, body fluids, etc.) '[]'$  - 0 Specimen(s) / Culture(s) sent or taken to Lab for analysis '[]'$  - 0 Patient Transfer (multiple staff / Civil Service fast streamer / Similar devices) '[]'$  - 0 Simple Staple / Suture removal (25 or less) '[]'$  - 0 Complex Staple / Suture removal (26 or more) '[]'$  - 0 Hypo / Hyperglycemic Management (close monitor of Blood Glucose) '[]'$  - 0 Ankle / Brachial Index (ABI) - do not check if billed separately X- 1 5 Vital Signs Has the patient been seen at the hospital within the last three years: Yes Total Score: 70 Level Of Care: New/Established - Level 2 Electronic Signature(s) Unsigned Entered By: Massie Kluver on 10/17/2021 10:09:08 Signature(s): Date(s): Theodore [name] ([id number]) -------------------------------------------------------------------------------- Encounter Discharge Information Details Patient Name: Theodore [name], Theodore [name] Date of Service: 10/17/2021 9:45 AM Medical Record Number: [id number] Patient Account Number: [id number] Date of Birth/Sex: February 13, 1930 (88 y.o. M) Treating RN: Cornell Barman Primary Care Analys Ryden: [name] Other Clinician: Massie Kluver Referring Braeton Wolgamott: [name] Treating Mieshia Pepitone/Extender: Skipper Cliche in Treatment: 2 Encounter Discharge Information Items Discharge Condition: Stable Ambulatory Status: Cane Discharge Destination: Home Transportation: Private  Auto Accompanied By: self Schedule Follow-up Appointment: Yes Clinical Summary of Care: Electronic Signature(s) Unsigned Entered By: Massie Kluver on 10/17/2021 10:11:15 Signature(s): Date(s): Theodore [name] ([id number]) -------------------------------------------------------------------------------- Lower Extremity Assessment Details Patient Name: Theodore [name], Theodore [name] Date of Service: 10/17/2021 9:45 AM Medical Record Number: [id number] Patient Account Number: [id number] Date of Birth/Sex: 1929-10-21 (88 y.o. M) Treating RN: Cornell Barman Primary Care Keshanna Riso: [name] Other Clinician: Massie Kluver Referring Giulliana Mcroberts: [name] Treating [place] Kappes/Extender: Jeri Cos Weeks in Treatment: 2 Edema Assessment Assessed: [Left: No] [Right: No] [Left: Edema] [Right: :] Calf Left: Right: Point of Measurement: 36 cm From Medial Instep 31.1 cm Ankle Left: Right: Point of Measurement: 12 cm From Medial Instep 21 cm Vascular Assessment Pulses: Dorsalis Pedis Palpable: [Left:Yes] Posterior Tibial Palpable: [Left:Yes] Electronic Signature(s) Unsigned Entered ByMassie Kluver on 10/17/2021 09:52:54 Signature(s): Date(s): Locicero, [name] ([id number]) -------------------------------------------------------------------------------- Multi Wound Chart Details Patient Name: Theodore [name], Theodore [name] Date of Service: 10/17/2021 9:45 AM Medical Record Number: [id number] Patient Account Number: [id number] Date of Birth/Sex: 08-16-1929 (88 y.o. M) Treating RN: Cornell Barman Primary Care Chole Driver: [name] Other Clinician: Massie Kluver Referring Jeffery Gammell: [name] Treating Siaosi Alter/Extender: Skipper Cliche in Treatment: 2 Vital Signs Height(in): 75 Pulse(bpm): 47 Weight(lbs): 170 Blood Pressure(mmHg): 149/73 Body Mass Index(BMI): 21.2 Temperature(F): 97.9 Respiratory Rate(breaths/min): 18 Photos: [N/A:N/A] Wound Location: Left, Anterior Lower Leg N/A N/A Wounding Event:  Trauma N/A N/A Primary Etiology: Arterial Insufficiency Ulcer N/A N/A Comorbid History: Peripheral Arterial Disease N/A N/A Date Acquired: 07/11/2021 N/A N/A Weeks of Treatment: 2 N/A N/A Wound Status: Healed - Epithelialized N/A N/A Wound Recurrence: No N/A N/A Measurements L x W x D (cm) 0x0x0 N/A N/A Area (cm) : 0 N/A N/A Volume (cm) : 0 N/A N/A % Reduction in Area: 100.00% N/A N/A % Reduction in Volume: 100.00% N/A N/A Classification: Full Thickness Without Exposed N/A N/A Support Structures Exudate Amount:  None Present N/A N/A Granulation Amount: None Present (0%) N/A N/A Necrotic Amount: None Present (0%) N/A N/A Exposed Structures: Fascia: No N/A N/A Fat Layer (Subcutaneous Tissue): No Tendon: No Muscle: No Joint: No Bone: No Epithelialization: Large (67-100%) N/A N/A Treatment Notes Electronic Signature(s) Unsigned Entered ByMassie Kluver on 10/17/2021 10:06:46 Signature(s): Date(s): Theodore [name] ([id number]) -------------------------------------------------------------------------------- Multi-Disciplinary Care Plan Details Patient Name: Theodore [name], Theodore [name] Date of Service: 10/17/2021 9:45 AM Medical Record Number: [id number] Patient Account Number: [id number] Date of Birth/Sex: 03-29-1929 (88 y.o. M) Treating RN: Cornell Barman Primary Care Kelcey Korus: [name] Other Clinician: Massie Kluver Referring Dejanae Helser: [name] Treating Joslynne Klatt/Extender: Jeri Cos Weeks in Treatment: 2 Active Inactive Electronic Signature(s) Unsigned Entered By: Massie Kluver on 10/17/2021 10:06:22 Signature(s): Date(s): Theodore [name] ([id number]) -------------------------------------------------------------------------------- Pain Assessment Details Patient Name: Theodore [name], Theodore [name] Date of Service: 10/17/2021 9:45 AM Medical Record Number: [id number] Patient Account Number: [id number] Date of Birth/Sex: Aug 21, 1929 (88 y.o. M) Treating RN: Cornell Barman Primary Care Lydiann Bonifas:  [name] Other Clinician: Massie Kluver Referring Akhil Piscopo: [name] Treating Tyaisha Cullom/Extender: Skipper Cliche in Treatment: 2 Active Problems Location of Pain Severity and Description of Pain Patient Has Paino No Site Locations Pain Management and Medication Current Pain Management: Electronic Signature(s) Unsigned Entered By: Massie Kluver on 10/17/2021 09:47:41 Signature(s): Date(s): Theodore [name] ([id number]) -------------------------------------------------------------------------------- Patient/Caregiver Education Details Patient Name: Theodore [name] Date of Service: 10/17/2021 9:45 AM Medical Record Number: [id number] Patient Account Number: [id number] Date of Birth/Gender: [birth year] (88 y.o. M) Treating RN: Cornell Barman Primary Care Physician: [name] Other Clinician: Massie Kluver Referring Physician: [name] Treating Physician/Extender: Skipper Cliche in Treatment: 2 Education Assessment Education Provided To: Patient Education Topics Provided Wound/Skin Impairment: Handouts: Other: wound healed. Please call if any further issues arise Electronic Signature(s) Unsigned Entered By: Massie Kluver on 10/17/2021 10:09:40 Signature(s): Date(s): GERAL, COKER ([id number]) -------------------------------------------------------------------------------- Wound Assessment Details Patient Name: Theodore [name], Theodore [name] Date of Service: 10/17/2021 9:45 AM Medical Record Number: [id number] Patient Account Number: [id number] Date of Birth/Sex: [birth year] (88 y.o. M) Treating RN: Cornell Barman Primary Care Nohelani Benning: [name] Other Clinician: Massie Kluver Referring Ariq Khamis: [name] Treating Grethel Zenk/Extender: Jeri Cos Weeks in Treatment: 2 Wound Status Wound Number: 1 Primary Etiology: Arterial Insufficiency Ulcer Wound Location: Left, Anterior Lower Leg Wound Status: Healed - Epithelialized Wounding Event: Trauma Comorbid History:  Peripheral Arterial Disease Date Acquired: 07/11/2021 Weeks Of Treatment: 2 Clustered Wound: No Photos Wound Measurements Length: (cm) 0 Width: (cm) 0 Depth: (cm) 0 Area: (cm) 0 Volume: (cm) 0 % Reduction in Area: 100% % Reduction in Volume: 100% Epithelialization: Large (67-100%) Wound Description Classification: Full Thickness Without Exposed Support Structure Exudate Amount: None Present s Foul Odor After Cleansing: No Slough/Fibrino No Wound Bed Granulation Amount: None Present (0%) Exposed Structure Necrotic Amount: None Present (0%) Fascia Exposed: No Fat Layer (Subcutaneous Tissue) Exposed: No Tendon Exposed: No Muscle Exposed: No Joint Exposed: No Bone Exposed: No Treatment Notes Wound #1 (Lower Leg) Wound Laterality: Left, Anterior Cleanser Peri-Wound Care Topical Primary Dressing Theodore [name], Theodore [name] ([id number]) Secondary Dressing Secured With Compression Wrap Compression Stockings Add-Ons Electronic Signature(s) Unsigned Entered By: Massie Kluver on 10/17/2021 10:06:05 Signature(s): Date(s): Theodore [name] ([id number]) -------------------------------------------------------------------------------- Vitals Details Patient Name: Theodore [name] Date of Service: 10/17/2021 9:45 AM Medical Record Number: [id number] Patient Account Number: [id number] Date of Birth/Sex: 08/26/1929 (88 y.o. M) Treating RN: Cornell Barman Primary Care Millie Shorb: [name] Other Clinician: Massie Kluver Referring Eliska Hamil: [name] Treating Zyanya Glaza/Extender: Skipper Cliche  in Treatment: 2 Vital Signs Time Taken: 09:45 Temperature (F): 97.9 Height (in): 75 Pulse (bpm): 56 Weight (lbs): 170 Respiratory Rate (breaths/min): 18 Body Mass Index (BMI): 21.2 Blood Pressure (mmHg): 149/73 Reference Range: 80 - 120 mg / dl Electronic Signature(s) Unsigned Entered ByMassie Kluver on 10/17/2021 09:47:34 Signature(s): Date(s):

## 2021-10-18 ENCOUNTER — Encounter: Payer: PPO | Admitting: Speech Pathology

## 2021-10-18 DIAGNOSIS — M9902 Segmental and somatic dysfunction of thoracic region: Secondary | ICD-10-CM | POA: Diagnosis not present

## 2021-10-18 DIAGNOSIS — M9903 Segmental and somatic dysfunction of lumbar region: Secondary | ICD-10-CM | POA: Diagnosis not present

## 2021-10-18 DIAGNOSIS — M9904 Segmental and somatic dysfunction of sacral region: Secondary | ICD-10-CM | POA: Diagnosis not present

## 2021-10-18 DIAGNOSIS — M9901 Segmental and somatic dysfunction of cervical region: Secondary | ICD-10-CM | POA: Diagnosis not present

## 2021-10-20 ENCOUNTER — Encounter: Payer: PPO | Admitting: Speech Pathology

## 2021-10-20 DIAGNOSIS — M9901 Segmental and somatic dysfunction of cervical region: Secondary | ICD-10-CM | POA: Diagnosis not present

## 2021-10-20 DIAGNOSIS — M9903 Segmental and somatic dysfunction of lumbar region: Secondary | ICD-10-CM | POA: Diagnosis not present

## 2021-10-20 DIAGNOSIS — M9902 Segmental and somatic dysfunction of thoracic region: Secondary | ICD-10-CM | POA: Diagnosis not present

## 2021-10-20 DIAGNOSIS — M9904 Segmental and somatic dysfunction of sacral region: Secondary | ICD-10-CM | POA: Diagnosis not present

## 2021-10-21 DIAGNOSIS — M9903 Segmental and somatic dysfunction of lumbar region: Secondary | ICD-10-CM | POA: Diagnosis not present

## 2021-10-21 DIAGNOSIS — M9902 Segmental and somatic dysfunction of thoracic region: Secondary | ICD-10-CM | POA: Diagnosis not present

## 2021-10-21 DIAGNOSIS — M9904 Segmental and somatic dysfunction of sacral region: Secondary | ICD-10-CM | POA: Diagnosis not present

## 2021-10-21 DIAGNOSIS — M9901 Segmental and somatic dysfunction of cervical region: Secondary | ICD-10-CM | POA: Diagnosis not present

## 2021-10-23 ENCOUNTER — Ambulatory Visit
Admission: EM | Admit: 2021-10-23 | Discharge: 2021-10-23 | Disposition: A | Discharge status: Home / Self Care | Payer: PPO | Attending: Emergency Medicine | Admitting: Emergency Medicine

## 2021-10-23 DIAGNOSIS — H60502 Unspecified acute noninfective otitis externa, left ear: Secondary | ICD-10-CM

## 2021-10-23 MED ORDER — NEOMYCIN-POLYMYXIN-HC 3.5-10000-1 OT SUSP: 4.0000 [drp] | Freq: Three times a day (TID) | OTIC | 0 refills | Status: DC

## 2021-10-23 NOTE — ED Triage Notes (Signed)
Patient presents to Urgent Care with complaints of left ear pain x 2 weeks. Has noted some drainage.Taking Tylenol.

## 2021-10-23 NOTE — Discharge Instructions (Addendum)
Use the eardrops as directed.  Follow-up with your primary care provider or ENT.

## 2021-10-23 NOTE — ED Provider Notes (Signed)
Theodore [name]    CSN: [id number] Arrival date & time: 10/23/21  6270      History   Chief Complaint Chief Complaint  Patient presents with   Otalgia    HPI Theodore [name] is a 88 y.o. male.  Patient presents with left ear pain x2 weeks which is getting worse.  The pain is worse with movement.  He has had some drainage also.  No fever, chills, sore throat, cough, shortness of breath, or other symptoms.  Treatment at home with Tylenol and tramadol.  The history is provided by the patient, the spouse and medical records.    Past Medical History:  Diagnosis Date   Actinic keratosis    Arthritis    knees   Asymptomatic Sinus bradycardia    a. 05/2016 Zio: Avg HR 61 (41-167).   Benign essential tremor    Cancer ([place]) 2002   melanoma right ear   Colon polyp    COPD (chronic obstructive pulmonary disease) ([place])    pt said he believes it is a misdiagnosis    Coronary artery calcification seen on CT scan    a. 06/2016 CTA Chest: cor Ca2+; b. 05/2017 MV: EF >65%. No ischemia/infarct.   Depression    History of echocardiogram    a. 04/2016 Echo: EF 60-65%, mild conc LVH. Nl PASP.   History of kidney stones    Hx of dysplastic nevus 05/07/2017   L anterior shoulder, severe   Hx of dysplastic nevus 09/06/2020   R lower sternum, moderate atypia   Hypothyroidism    Hypothyroidism    Melanoma ([place]) 1990's per pt   R ear   Melanoma in situ ([place]) 01/12/2014   left jaw   Palpitations    a. 05/2016 Zio: Avg HR 61 (41-167). 9 SVT runs (fastest 167 - 5 beats; longest 8 beats - 101 bpm). Rare PACs/PVCs.   PVC's (premature ventricular contractions)    Rhinitis    Wears dentures    partial upper and lower   Wears hearing aid in both ears     Patient Active Problem List   Diagnosis Date Noted   Nonhealing nonsurgical wound limited to breakdown of skin 09/22/2021   Proximal muscle weakness 08/27/2021   Speech disturbance 08/26/2021   Adverse drug reaction, initial encounter  05/04/2021   Left lumbar radiculopathy 04/25/2021   Thoracic aortic atherosclerosis ([place]) 01/17/2021   Dizziness 12/31/2020   Anxiety in acute stress reaction 12/07/2020   CKD (chronic kidney disease) stage 3, GFR 30-59 ml/min ([place]) 12/01/2020   Mild cognitive impairment with memory loss 11/30/2020   Elevated PSA, between 10 and less than 20 ng/ml 11/30/2020   COVID-19 virus infection 04/06/2020   Acquired trigger finger of left middle finger 08/07/2019   Carpal tunnel syndrome of right wrist 07/10/2019   Pain in right hand 06/05/2019   Ulnar neuropathy 06/05/2019   Essential tremor 05/28/2019   Disorder of bursae of shoulder region 01/29/2019   Localized, primary osteoarthritis of shoulder region 01/29/2019   Localized, secondary osteoarthritis of the shoulder region 01/29/2019   Osteoarthritis of knee 01/29/2019   Shoulder joint pain 01/29/2019   Chronic obstructive pulmonary disease ([place]) 09/24/2018   Grief 09/02/2018   Cervical radiculopathy 08/14/2018   Neck pain 08/12/2018   Sinus bradycardia 03/21/2018   DOE (dyspnea on exertion) 03/21/2018   PVC (premature ventricular contraction) 03/21/2018   Weakness of both lower extremities 08/09/2017   Swallowing dysfunction 09/05/2016   Ringworm, body 06/27/2016  Bradycardia 05/10/2016   Coronary artery disease involving native coronary artery of native heart with angina pectoris (HCC)    PAD (peripheral artery disease) ([place])    Asthma 05/09/2016   Anterior knee pain, left 12/26/2015   Hypothyroidism 07/04/2014   Hyperlipidemia LDL goal <100 07/04/2014   Nonallergic vasomotor rhinitis 07/01/2014   Long-term use of high-risk medication 01/16/2014   Hypertrophy of prostate with urinary obstruction and other lower urinary tract symptoms (LUTS) 08/08/2013   Hypogonadism male 08/08/2013   Vitamin D deficiency 07/31/2013   Depression with anxiety 04/28/2013   Low serum testosterone level 04/28/2013   Actinic keratosis 05/10/2010    History of malignant melanoma of skin 05/10/2010   Inflamed seborrheic keratosis 05/10/2010    Past Surgical History:  Procedure Laterality Date   ADENOIDECTOMY     CATARACT EXTRACTION W/PHACO Left 10/22/2017   Procedure: CATARACT EXTRACTION PHACO AND INTRAOCULAR LENS PLACEMENT ([place])  LEFT;  Surgeon: Eulogio Bear, MD;  Location: [facility];  Service: Ophthalmology;  Laterality: Left;   CATARACT EXTRACTION W/PHACO Right 11/20/2017   Procedure: CATARACT EXTRACTION PHACO AND INTRAOCULAR LENS PLACEMENT ([facility]) RIGHT;  Surgeon: Eulogio Bear, MD;  Location: [facility];  Service: Ophthalmology;  Laterality: Right;   ESOPHAGOGASTRODUODENOSCOPY (EGD) WITH PROPOFOL N/A 10/31/2016   Procedure: ESOPHAGOGASTRODUODENOSCOPY (EGD) WITH PROPOFOL;  Surgeon: Lollie Sails, MD;  Location[facility] ENDOSCOPY;  Service: Endoscopy;  Laterality: N/A;   JOINT REPLACEMENT Right 2003   knee   knee meniscus repair Right    MELANOMA EXCISION Right    ear. Followed by Dr. Nehemiah Massed   PATELLECTOMY Bilateral    TONSILLECTOMY         Home Medications    Prior to Admission medications   Medication Sig Start Date End Date Taking? Authorizing Provider  neomycin-polymyxin-hydrocortisone (CORTISPORIN) 3.5-10000-1 OTIC suspension Place 4 drops into the left ear 3 (three) times daily. 10/23/21  Yes Sharion Balloon, NP  Alpha-Lipoic Acid 600 MG CAPS Take by mouth.    [provider]  ascorbic Acid (VITAMIN C) 500 MG CPCR Take by mouth.    [provider]  buPROPion (WELLBUTRIN XL) 150 MG 24 hr tablet TAKE 1 TABLET BY MOUTH EVERY DAY 05/25/21   Dutch Quint B, FNP  Cholecalciferol (VITAMIN D3) 25 MCG (1000 UT) CAPS Take 2 capsules (2,000 Units total) by mouth daily. 08/29/21   [name], MD  Coenzyme Q10 100 MG capsule Take by mouth.    [provider]  cycloSPORINE (RESTASIS) 0.05 % ophthalmic emulsion Restasis 0.05 % eye drops in a dropperette  INSTILL 1 DROP INTO  BOTH EYES TWICE A DAY    [provider]  donepezil (ARICEPT) 10 MG tablet Take 10 mg by mouth every morning. 05/09/21   [provider]  ezetimibe (ZETIA) 10 MG tablet Take 1 tablet (10 mg total) by mouth daily. 08/25/20   Minna Merritts, MD  levothyroxine (SYNTHROID) 100 MCG tablet TAKE 1 TABLET BY MOUTH EVERY DAY BEFORE BREAKFAST 09/20/21   [name], MD  magnesium oxide (MAG-OX) 400 MG tablet Take 400 mg by mouth daily.    [provider]  OVER THE COUNTER MEDICATION Take by mouth daily. Hemp oil    [provider]  Probiotic Product (PROBIOTIC + TURMERIC EXTRACT) 400 MG CAPS Take by mouth.    [provider]  Probiotic Product (PROBIOTIC ACIDOPHILUS) CAPS Take 1 capsule by mouth daily.    [provider]  rosuvastatin (CRESTOR) 10 MG  tablet Take 1 tablet (10 mg total) by mouth daily. 03/15/21   [name], MD  sertraline (ZOLOFT) 50 MG tablet TAKE 1 TABLET (50 MG TOTAL) BY MOUTH DAILY. WITH A MEAL. Patient not taking: Reported on 09/30/2021 12/29/20   [name], MD  testosterone cypionate (DEPOTESTOSTERONE CYPIONATE) 200 MG/ML injection INJECT 0.5 ML (100 MG TOTAL) INTO THE MUSCLE EVERY 14 DAYS. 05/10/21   [name], MD  Turmeric 400 MG CAPS Take by mouth.    [provider]  vitamin B-12 (CYANOCOBALAMIN) 500 MCG tablet Take 1 tablet by mouth daily.    [provider]    Family History Family History  Problem Relation Age of Onset   Hypertension Mother    Heart disease Mother        CHF   Heart disease Father    Cancer Sister        melanoma    Social History Social History   Tobacco Use   Smoking status: Never   Smokeless tobacco: Never  Vaping Use   Vaping Use: Never used  Substance Use Topics   Alcohol use: Yes    Comment: 1 glass of wine daily   Drug use: No     Allergies   Penicillins and Lexapro [escitalopram]   Review of Systems Review of Systems  Constitutional:   Negative for chills and fever.  HENT:  Positive for ear discharge and ear pain. Negative for sore throat.   Respiratory:  Negative for cough and shortness of breath.   Cardiovascular:  Negative for chest pain and palpitations.  Skin:  Negative for color change and rash.  All other systems reviewed and are negative.    Physical Exam Triage Vital Signs ED Triage Vitals  Enc Vitals Group     BP      Pulse      Resp      Temp      Temp src      SpO2      Weight      Height      Head Circumference      Peak Flow      Pain Score      Pain Loc      Pain Edu?      Excl. in [place]?    No data found.  Updated Vital Signs BP 125/79   Pulse 75   Temp 98.4 F (36.9 C)   Resp 18   SpO2 97%   Visual Acuity Right Eye Distance:   Left Eye Distance:   Bilateral Distance:    Right Eye Near:   Left Eye Near:    Bilateral Near:     Physical Exam Vitals and nursing note reviewed.  Constitutional:      General: He is not in acute distress.    Appearance: Normal appearance. He is well-developed. He is not ill-appearing.  HENT:     Right Ear: Tympanic membrane and ear canal normal.     Left Ear: Drainage present.     Ears:     Comments: Left ear tender to movement and touch.  Purulent drainage in ear canal.  Unable to visualize TM.    Nose: Nose normal.     Mouth/Throat:     Mouth: Mucous membranes are moist.     Pharynx: Oropharynx is clear.  Cardiovascular:     Rate and Rhythm: Normal rate and regular rhythm.     Heart sounds: Normal heart sounds.  Pulmonary:  Effort: Pulmonary effort is normal. No respiratory distress.     Breath sounds: Normal breath sounds.  Musculoskeletal:     Cervical back: Neck supple.  Skin:    General: Skin is warm and dry.  Neurological:     Mental Status: He is alert.  Psychiatric:        Mood and Affect: Mood normal.        Behavior: Behavior normal.      UC Treatments / Results  Labs (all labs ordered are listed, but only  abnormal results are displayed) Labs Reviewed - No data to display  EKG   Radiology No results found.  Procedures Procedures (including critical care time)  Medications Ordered in UC Medications - No data to display  Initial Impression / Assessment and Plan / UC Course  I have reviewed the triage vital signs and the nursing notes.  Pertinent labs & imaging results that were available during my care of the patient were reviewed by me and considered in my medical decision making (see chart for details).   Left otitis externa.  Treating with Cortisporin eardrops.  Instructed patient to take Tylenol as needed for discomfort and to discontinue taking tramadol for ear pain.  Instructed him to follow-up with his PCP or ENT for a recheck.  Education provided on otitis externa.  Patient and his wife agreed to plan of care.   Final Clinical Impressions(s) / UC Diagnoses   Final diagnoses:  Acute otitis externa of left ear, unspecified type     Discharge Instructions      Use the eardrops as directed.  Follow-up with your primary care provider or ENT.     ED Prescriptions     Medication Sig Dispense Auth. Provider   neomycin-polymyxin-hydrocortisone (CORTISPORIN) 3.5-10000-1 OTIC suspension Place 4 drops into the left ear 3 (three) times daily. 10 mL Sharion Balloon, NP      PDMP not reviewed this encounter.   Sharion Balloon, NP 10/23/21 [phone]

## 2021-10-25 ENCOUNTER — Encounter: Payer: PPO | Admitting: Speech Pathology

## 2021-10-25 ENCOUNTER — Other Ambulatory Visit: Payer: Self-pay | Admitting: Cardiovascular Disease

## 2021-10-26 DIAGNOSIS — K219 Gastro-esophageal reflux disease without esophagitis: Secondary | ICD-10-CM | POA: Diagnosis not present

## 2021-10-26 DIAGNOSIS — H903 Sensorineural hearing loss, bilateral: Secondary | ICD-10-CM | POA: Diagnosis not present

## 2021-10-26 DIAGNOSIS — R1314 Dysphagia, pharyngoesophageal phase: Secondary | ICD-10-CM | POA: Diagnosis not present

## 2021-10-26 DIAGNOSIS — H60339 Swimmer's ear, unspecified ear: Secondary | ICD-10-CM | POA: Diagnosis not present

## 2021-10-27 ENCOUNTER — Encounter: Payer: PPO | Admitting: Speech Pathology

## 2021-10-28 ENCOUNTER — Other Ambulatory Visit: Payer: Self-pay | Admitting: Otolaryngology

## 2021-10-28 DIAGNOSIS — R1310 Dysphagia, unspecified: Secondary | ICD-10-CM

## 2021-11-01 ENCOUNTER — Encounter: Payer: PPO | Admitting: Speech Pathology

## 2021-11-01 DIAGNOSIS — M9903 Segmental and somatic dysfunction of lumbar region: Secondary | ICD-10-CM | POA: Diagnosis not present

## 2021-11-01 DIAGNOSIS — M9901 Segmental and somatic dysfunction of cervical region: Secondary | ICD-10-CM | POA: Diagnosis not present

## 2021-11-01 DIAGNOSIS — M9902 Segmental and somatic dysfunction of thoracic region: Secondary | ICD-10-CM | POA: Diagnosis not present

## 2021-11-01 DIAGNOSIS — M9904 Segmental and somatic dysfunction of sacral region: Secondary | ICD-10-CM | POA: Diagnosis not present

## 2021-11-02 DIAGNOSIS — N1831 Chronic kidney disease, stage 3a: Secondary | ICD-10-CM | POA: Diagnosis not present

## 2021-11-02 DIAGNOSIS — R8290 Unspecified abnormal findings in urine: Secondary | ICD-10-CM | POA: Diagnosis not present

## 2021-11-02 DIAGNOSIS — N1832 Chronic kidney disease, stage 3b: Secondary | ICD-10-CM | POA: Diagnosis not present

## 2021-11-02 DIAGNOSIS — R319 Hematuria, unspecified: Secondary | ICD-10-CM | POA: Diagnosis not present

## 2021-11-02 DIAGNOSIS — I129 Hypertensive chronic kidney disease with stage 1 through stage 4 chronic kidney disease, or unspecified chronic kidney disease: Secondary | ICD-10-CM | POA: Diagnosis not present

## 2021-11-02 DIAGNOSIS — I10 Essential (primary) hypertension: Secondary | ICD-10-CM | POA: Diagnosis not present

## 2021-11-03 ENCOUNTER — Encounter: Payer: PPO | Admitting: Speech Pathology

## 2021-11-03 DIAGNOSIS — M9903 Segmental and somatic dysfunction of lumbar region: Secondary | ICD-10-CM | POA: Diagnosis not present

## 2021-11-03 DIAGNOSIS — M9902 Segmental and somatic dysfunction of thoracic region: Secondary | ICD-10-CM | POA: Diagnosis not present

## 2021-11-03 DIAGNOSIS — M9904 Segmental and somatic dysfunction of sacral region: Secondary | ICD-10-CM | POA: Diagnosis not present

## 2021-11-03 DIAGNOSIS — M9901 Segmental and somatic dysfunction of cervical region: Secondary | ICD-10-CM | POA: Diagnosis not present

## 2021-11-04 ENCOUNTER — Ambulatory Visit
Admission: RE | Admit: 2021-11-04 | Discharge: 2021-11-04 | Disposition: A | Discharge status: Home / Self Care | Payer: PPO | Source: Ambulatory Visit | Attending: Otolaryngology | Admitting: Otolaryngology

## 2021-11-04 DIAGNOSIS — R490 Dysphonia: Secondary | ICD-10-CM | POA: Diagnosis not present

## 2021-11-04 DIAGNOSIS — R1310 Dysphagia, unspecified: Secondary | ICD-10-CM | POA: Diagnosis not present

## 2021-11-04 NOTE — Progress Notes (Signed)
Modified Barium Swallow Progress Note  Patient Details  Name: Theodore [name] MRN: [id number] Date of Birth: [birth year]  Today's Date: 11/04/2021  Modified Barium Swallow completed.  Full report located under Chart Review in the Imaging Section.  Brief recommendations include the following:  Clinical Impression  Pt presents with overall adequate oropharyngeal abilities when consuming puree, soft solids, thin liquids via spoon and cup as well as whole barium tablet with thin liquids. Pt's swallow function doesn't appear any different than documented in his previous MBSS (2017) which stated, "Thin liquid- small amount of laryngeal penetration with rapid successive swallowing maneuvers. With single swallowing maneuvers no laryngeal penetration is observed.  As well in this previous study, pt continues to demonstrate penetration with trace aspiration when consuming successive swallows. Pt demonstrated understanding need to take single sips as he stated that he doesn't consume consecutive sips at baseline. At this time, pt presents with adequate oropharyngeal abilities and reduced risk of aspiration when consuming regular diet with thin liquids via single cup sips and medicine whole with thin liquids. Skilled ST intervention is not indicated.    Swallow Evaluation Recommendations       SLP Diet Recommendations: Regular solids;Thin liquid   Liquid Administration via: Cup   Medication Administration: Whole meds with liquid   Supervision: Patient able to self feed   Compensations: Minimize environmental distractions;Slow rate;Small sips/bites   Postural Changes: Remain semi-upright after after feeds/meals (Comment);Seated upright at 90 degrees   Oral Care Recommendations: Oral care BID      Ileana Chalupa B. Rutherford Nail, M.S., CCC-SLP, Mining engineer Certified Brain Injury [place]  [place] Office [phone] Ascom  [phone] Fax [phone]

## 2021-11-04 NOTE — Addendum Note (Signed)
Encounter addended by: Daisy Lazar A, RT on: 11/04/2021 1:48 PM  Actions taken: Imaging Exam ended

## 2021-11-08 ENCOUNTER — Encounter: Payer: PPO | Admitting: Speech Pathology

## 2021-11-08 DIAGNOSIS — M9902 Segmental and somatic dysfunction of thoracic region: Secondary | ICD-10-CM | POA: Diagnosis not present

## 2021-11-08 DIAGNOSIS — M9904 Segmental and somatic dysfunction of sacral region: Secondary | ICD-10-CM | POA: Diagnosis not present

## 2021-11-08 DIAGNOSIS — M9903 Segmental and somatic dysfunction of lumbar region: Secondary | ICD-10-CM | POA: Diagnosis not present

## 2021-11-08 DIAGNOSIS — M9901 Segmental and somatic dysfunction of cervical region: Secondary | ICD-10-CM | POA: Diagnosis not present

## 2021-11-09 DIAGNOSIS — N1831 Chronic kidney disease, stage 3a: Secondary | ICD-10-CM | POA: Diagnosis not present

## 2021-11-09 DIAGNOSIS — I10 Essential (primary) hypertension: Secondary | ICD-10-CM | POA: Diagnosis not present

## 2021-11-10 ENCOUNTER — Encounter: Payer: PPO | Admitting: Speech Pathology

## 2021-11-10 DIAGNOSIS — M9902 Segmental and somatic dysfunction of thoracic region: Secondary | ICD-10-CM | POA: Diagnosis not present

## 2021-11-10 DIAGNOSIS — M9903 Segmental and somatic dysfunction of lumbar region: Secondary | ICD-10-CM | POA: Diagnosis not present

## 2021-11-10 DIAGNOSIS — M9904 Segmental and somatic dysfunction of sacral region: Secondary | ICD-10-CM | POA: Diagnosis not present

## 2021-11-10 DIAGNOSIS — M9901 Segmental and somatic dysfunction of cervical region: Secondary | ICD-10-CM | POA: Diagnosis not present

## 2021-11-11 HISTORY — PX: LEG WOUND REPAIR / CLOSURE: SUR1143

## 2021-11-13 ENCOUNTER — Other Ambulatory Visit: Payer: Self-pay | Admitting: Internal Medicine

## 2021-11-15 ENCOUNTER — Encounter: Payer: PPO | Admitting: Speech Pathology

## 2021-11-15 DIAGNOSIS — M9901 Segmental and somatic dysfunction of cervical region: Secondary | ICD-10-CM | POA: Diagnosis not present

## 2021-11-15 DIAGNOSIS — M9903 Segmental and somatic dysfunction of lumbar region: Secondary | ICD-10-CM | POA: Diagnosis not present

## 2021-11-15 DIAGNOSIS — R4789 Other speech disturbances: Secondary | ICD-10-CM | POA: Diagnosis not present

## 2021-11-15 DIAGNOSIS — M9902 Segmental and somatic dysfunction of thoracic region: Secondary | ICD-10-CM | POA: Diagnosis not present

## 2021-11-15 DIAGNOSIS — R2681 Unsteadiness on feet: Secondary | ICD-10-CM | POA: Diagnosis not present

## 2021-11-15 DIAGNOSIS — M9904 Segmental and somatic dysfunction of sacral region: Secondary | ICD-10-CM | POA: Diagnosis not present

## 2021-11-15 DIAGNOSIS — M6281 Muscle weakness (generalized): Secondary | ICD-10-CM | POA: Diagnosis not present

## 2021-11-15 DIAGNOSIS — R296 Repeated falls: Secondary | ICD-10-CM | POA: Diagnosis not present

## 2021-11-17 ENCOUNTER — Encounter: Payer: PPO | Admitting: Speech Pathology

## 2021-11-17 DIAGNOSIS — M9902 Segmental and somatic dysfunction of thoracic region: Secondary | ICD-10-CM | POA: Diagnosis not present

## 2021-11-17 DIAGNOSIS — M9903 Segmental and somatic dysfunction of lumbar region: Secondary | ICD-10-CM | POA: Diagnosis not present

## 2021-11-17 DIAGNOSIS — M9901 Segmental and somatic dysfunction of cervical region: Secondary | ICD-10-CM | POA: Diagnosis not present

## 2021-11-17 DIAGNOSIS — M9904 Segmental and somatic dysfunction of sacral region: Secondary | ICD-10-CM | POA: Diagnosis not present

## 2021-11-22 ENCOUNTER — Encounter: Payer: PPO | Admitting: Speech Pathology

## 2021-11-22 DIAGNOSIS — M65341 Trigger finger, right ring finger: Secondary | ICD-10-CM | POA: Diagnosis not present

## 2021-11-22 DIAGNOSIS — J310 Chronic rhinitis: Secondary | ICD-10-CM | POA: Diagnosis not present

## 2021-11-22 DIAGNOSIS — K219 Gastro-esophageal reflux disease without esophagitis: Secondary | ICD-10-CM | POA: Diagnosis not present

## 2021-11-22 DIAGNOSIS — M65332 Trigger finger, left middle finger: Secondary | ICD-10-CM | POA: Diagnosis not present

## 2021-11-22 DIAGNOSIS — M65331 Trigger finger, right middle finger: Secondary | ICD-10-CM | POA: Diagnosis not present

## 2021-11-22 DIAGNOSIS — J9589 Other postprocedural complications and disorders of respiratory system, not elsewhere classified: Secondary | ICD-10-CM | POA: Diagnosis not present

## 2021-11-23 ENCOUNTER — Ambulatory Visit: Payer: PPO | Admitting: Podiatry

## 2021-11-23 DIAGNOSIS — N1831 Chronic kidney disease, stage 3a: Secondary | ICD-10-CM | POA: Diagnosis not present

## 2021-11-23 DIAGNOSIS — G3184 Mild cognitive impairment, so stated: Secondary | ICD-10-CM | POA: Diagnosis not present

## 2021-11-23 DIAGNOSIS — Z6821 Body mass index (BMI) 21.0-21.9, adult: Secondary | ICD-10-CM | POA: Diagnosis not present

## 2021-11-23 DIAGNOSIS — I739 Peripheral vascular disease, unspecified: Secondary | ICD-10-CM | POA: Diagnosis not present

## 2021-11-23 DIAGNOSIS — Z515 Encounter for palliative care: Secondary | ICD-10-CM | POA: Diagnosis not present

## 2021-11-23 DIAGNOSIS — F3342 Major depressive disorder, recurrent, in full remission: Secondary | ICD-10-CM | POA: Diagnosis not present

## 2021-11-23 DIAGNOSIS — D692 Other nonthrombocytopenic purpura: Secondary | ICD-10-CM | POA: Diagnosis not present

## 2021-11-24 ENCOUNTER — Encounter: Payer: PPO | Admitting: Speech Pathology

## 2021-11-24 DIAGNOSIS — M9901 Segmental and somatic dysfunction of cervical region: Secondary | ICD-10-CM | POA: Diagnosis not present

## 2021-11-24 DIAGNOSIS — M9904 Segmental and somatic dysfunction of sacral region: Secondary | ICD-10-CM | POA: Diagnosis not present

## 2021-11-24 DIAGNOSIS — M9903 Segmental and somatic dysfunction of lumbar region: Secondary | ICD-10-CM | POA: Diagnosis not present

## 2021-11-24 DIAGNOSIS — M9902 Segmental and somatic dysfunction of thoracic region: Secondary | ICD-10-CM | POA: Diagnosis not present

## 2021-11-25 ENCOUNTER — Ambulatory Visit
Admission: EM | Admit: 2021-11-25 | Discharge: 2021-11-25 | Disposition: A | Discharge status: Home / Self Care | Payer: PPO | Attending: Urgent Care | Admitting: Urgent Care

## 2021-11-25 ENCOUNTER — Other Ambulatory Visit: Payer: PPO

## 2021-11-25 DIAGNOSIS — S81811A Laceration without foreign body, right lower leg, initial encounter: Secondary | ICD-10-CM | POA: Diagnosis not present

## 2021-11-25 DIAGNOSIS — R972 Elevated prostate specific antigen [PSA]: Secondary | ICD-10-CM | POA: Diagnosis not present

## 2021-11-25 NOTE — ED Provider Notes (Signed)
Theodore [name]    CSN: [id number] Arrival date & time: 11/25/21  0846      History   Chief Complaint Chief Complaint  Patient presents with   Laceration    HPI Theodore [name] is a 88 y.o. male.    Laceration  Patient presents to urgent care with injury to his right shin.  He states he was working in his attic performing home renovations yesterday when he injured himself with plywood.  Patient states he had some home bandages and so cleaned it with hydrogen peroxide and then applied a gum like bandage to hold it together.  Past Medical History:  Diagnosis Date   Actinic keratosis    Arthritis    knees   Asymptomatic Sinus bradycardia    a. 05/2016 Zio: Avg HR 61 (41-167).   Benign essential tremor    Cancer ([place]) 2002   melanoma right ear   Colon polyp    COPD (chronic obstructive pulmonary disease) ([place])    pt said he believes it is a misdiagnosis    Coronary artery calcification seen on CT scan    a. 06/2016 CTA Chest: cor Ca2+; b. 05/2017 MV: EF >65%. No ischemia/infarct.   Depression    History of echocardiogram    a. 04/2016 Echo: EF 60-65%, mild conc LVH. Nl PASP.   History of kidney stones    Hx of dysplastic nevus 05/07/2017   L anterior shoulder, severe   Hx of dysplastic nevus 09/06/2020   R lower sternum, moderate atypia   Hypothyroidism    Hypothyroidism    Melanoma ([place]) 1990's per pt   R ear   Melanoma in situ ([place]) 01/12/2014   left jaw   Palpitations    a. 05/2016 Zio: Avg HR 61 (41-167). 9 SVT runs (fastest 167 - 5 beats; longest 8 beats - 101 bpm). Rare PACs/PVCs.   PVC's (premature ventricular contractions)    Rhinitis    Wears dentures    partial upper and lower   Wears hearing aid in both ears     Patient Active Problem List   Diagnosis Date Noted   Nonhealing nonsurgical wound limited to breakdown of skin 09/22/2021   Proximal muscle weakness 08/27/2021   Speech disturbance 08/26/2021   Adverse drug reaction, initial encounter  05/04/2021   Left lumbar radiculopathy 04/25/2021   Thoracic aortic atherosclerosis ([place]) 01/17/2021   Dizziness 12/31/2020   Anxiety in acute stress reaction 12/07/2020   CKD (chronic kidney disease) stage 3, GFR 30-59 ml/min ([place]) 12/01/2020   Mild cognitive impairment with memory loss 11/30/2020   Elevated PSA, between 10 and less than 20 ng/ml 11/30/2020   COVID-19 virus infection 04/06/2020   Acquired trigger finger of left middle finger 08/07/2019   Carpal tunnel syndrome of right wrist 07/10/2019   Pain in right hand 06/05/2019   Ulnar neuropathy 06/05/2019   Essential tremor 05/28/2019   Disorder of bursae of shoulder region 01/29/2019   Localized, primary osteoarthritis of shoulder region 01/29/2019   Localized, secondary osteoarthritis of the shoulder region 01/29/2019   Osteoarthritis of knee 01/29/2019   Shoulder joint pain 01/29/2019   Chronic obstructive pulmonary disease ([place]) 09/24/2018   Grief 09/02/2018   Cervical radiculopathy 08/14/2018   Neck pain 08/12/2018   Sinus bradycardia 03/21/2018   DOE (dyspnea on exertion) 03/21/2018   PVC (premature ventricular contraction) 03/21/2018   Weakness of both lower extremities 08/09/2017   Swallowing dysfunction 09/05/2016   Ringworm, body 06/27/2016   Bradycardia 05/10/2016  Coronary artery disease involving native coronary artery of native heart with angina pectoris ([place])    PAD (peripheral artery disease) ([place])    Asthma 05/09/2016   Anterior knee pain, left 12/26/2015   Hypothyroidism 07/04/2014   Hyperlipidemia LDL goal <100 07/04/2014   Nonallergic vasomotor rhinitis 07/01/2014   Long-term use of high-risk medication 01/16/2014   Hypertrophy of prostate with urinary obstruction and other lower urinary tract symptoms (LUTS) 08/08/2013   Hypogonadism male 08/08/2013   Vitamin D deficiency 07/31/2013   Depression with anxiety 04/28/2013   Low serum testosterone level 04/28/2013   Actinic keratosis 05/10/2010    History of malignant melanoma of skin 05/10/2010   Inflamed seborrheic keratosis 05/10/2010    Past Surgical History:  Procedure Laterality Date   ADENOIDECTOMY     CATARACT EXTRACTION W/PHACO Left 10/22/2017   Procedure: CATARACT EXTRACTION PHACO AND INTRAOCULAR LENS PLACEMENT ([place])  LEFT;  Surgeon: Eulogio Bear, MD;  Location: [facility];  Service: Ophthalmology;  Laterality: Left;   CATARACT EXTRACTION W/PHACO Right 11/20/2017   Procedure: CATARACT EXTRACTION PHACO AND INTRAOCULAR LENS PLACEMENT ([facility]) RIGHT;  Surgeon: Eulogio Bear, MD;  Location: [facility];  Service: Ophthalmology;  Laterality: Right;   ESOPHAGOGASTRODUODENOSCOPY (EGD) WITH PROPOFOL N/A 10/31/2016   Procedure: ESOPHAGOGASTRODUODENOSCOPY (EGD) WITH PROPOFOL;  Surgeon: Lollie Sails, MD;  Location: [facility] END[facility]COPY;  Service: Endoscopy;  Laterality: N/A;   JOINT REPLACEMENT Right 2003   knee   knee meniscus repair Right    MELANOMA EXCISION Right    ear. Followed by Dr. Nehemiah Massed   PATELLECTOMY Bilateral    TONSILLECTOMY         Home Medications    Prior to Admission medications   Medication Sig Start Date End Date Taking? Authorizing Provider  Alpha-Lipoic Acid 600 MG CAPS Take by mouth.    [provider]  ascorbic Acid (VITAMIN C) 500 MG CPCR Take by mouth.    [provider]  buPROPion (WELLBUTRIN XL) 150 MG 24 hr tablet TAKE 1 TABLET BY MOUTH EVERY DAY 05/25/21   Dutch Quint B, FNP  Cholecalciferol (VITAMIN D3) 25 MCG (1000 UT) CAPS Take 2 capsules (2,000 Units total) by mouth daily. 08/29/21   [name], MD  Coenzyme Q10 100 MG capsule Take by mouth.    [provider]  cycloSPORINE (RESTASIS) 0.05 % ophthalmic emulsion Restasis 0.05 % eye drops in a dropperette  INSTILL 1 DROP INTO BOTH EYES TWICE A DAY    [provider]  donepezil (ARICEPT) 10 MG tablet Take 10 mg by mouth every morning. 05/09/21   [provider]  ezetimibe  (ZETIA) 10 MG tablet TAKE 1 TABLET BY MOUTH EVERY DAY 10/25/21   Minna Merritts, MD  levothyroxine (SYNTHROID) 100 MCG tablet TAKE 1 TABLET BY MOUTH EVERY DAY BEFORE BREAKFAST 09/20/21   [name], MD  magnesium oxide (MAG-OX) 400 MG tablet Take 400 mg by mouth daily.    [provider]  neomycin-polymyxin-hydrocortisone (CORTISPORIN) 3.5-10000-1 OTIC suspension Place 4 drops into the left ear 3 (three) times daily. 10/23/21   Sharion Balloon, NP  OVER THE COUNTER MEDICATION Take by mouth daily. Hemp oil    [provider]  Probiotic Product (PROBIOTIC + TURMERIC EXTRACT) 400 MG CAPS Take by mouth.    [provider]  Probiotic Product (PROBIOTIC ACIDOPHILUS) CAPS Take 1 capsule by mouth daily.    [provider]  rosuvastatin (CRESTOR) 10 MG tablet Take 1 tablet (10 mg  total) by mouth daily. 03/15/21   [name], MD  sertraline (ZOLOFT) 50 MG tablet TAKE 1 TABLET (50 MG TOTAL) BY MOUTH DAILY. WITH A MEAL. Patient not taking: Reported on 09/30/2021 12/29/20   [name], MD  testosterone cypionate (DEPOTESTOSTERONE CYPIONATE) 200 MG/ML injection INJECT 0.5 ML (100 MG TOTAL) INTO THE MUSCLE EVERY 14 DAYS. 11/13/21   [name], MD  Turmeric 400 MG CAPS Take by mouth.    [provider]  vitamin B-12 (CYANOCOBALAMIN) 500 MCG tablet Take 1 tablet by mouth daily.    [provider]    Family History Family History  Problem Relation Age of Onset   Hypertension Mother    Heart disease Mother        CHF   Heart disease Father    Cancer Sister        melanoma    Social History Social History   Tobacco Use   Smoking status: Never   Smokeless tobacco: Never  Vaping Use   Vaping Use: Never used  Substance Use Topics   Alcohol use: Yes    Comment: 1 glass of wine daily   Drug use: No     Allergies   Penicillins and Lexapro [escitalopram]   Review of Systems Review of Systems   Physical Exam Triage Vital  Signs ED Triage Vitals  Enc Vitals Group     BP 11/25/21 0913 128/72     Pulse Rate 11/25/21 0913 (!) 50     Resp 11/25/21 0913 15     Temp 11/25/21 0913 97.7 F (36.5 C)     Temp src --      SpO2 11/25/21 0913 97 %     Weight --      Height --      Head Circumference --      Peak Flow --      Pain Score 11/25/21 0911 5     Pain Loc --      Pain Edu? --      Excl. in [place]? --    No data found.  Updated Vital Signs BP 128/72   Pulse (!) 50   Temp 97.7 F (36.5 C)   Resp 15   SpO2 97%   Visual Acuity Right Eye Distance:   Left Eye Distance:   Bilateral Distance:    Right Eye Near:   Left Eye Near:    Bilateral Near:     Physical Exam Vitals reviewed.  Constitutional:      Appearance: Normal appearance.  Skin:    General: Skin is warm and dry.     Findings: Laceration present.       Neurological:     General: No focal deficit present.     Mental Status: He is alert and oriented to person, place, and time.  Psychiatric:        Mood and Affect: Mood normal.        Behavior: Behavior normal.      UC Treatments / Results  Labs (all labs ordered are listed, but only abnormal results are displayed) Labs Reviewed - No data to display  EKG   Radiology No results found.  Procedures Laceration Repair  Date/Time: 11/25/2021 9:35 AM  Performed by: Rose Phi, FNP Authorized by: Rose Phi, FNP   Consent:    Consent obtained:  Verbal   Consent given by:  Patient   Risks discussed:  Infection, pain, retained foreign body, poor cosmetic result and poor  wound healing Universal protocol:    Procedure explained and questions answered to patient or proxy's satisfaction: yes     Relevant documents present and verified: yes     Test results available: no     Imaging studies available: no     Required blood products, implants, devices, and special equipment available: no     Site/side marked: no     Immediately prior to procedure, a time out  was called: yes     Patient identity confirmed:  Verbally with patient Anesthesia:    Anesthesia method:  Local infiltration   Local anesthetic:  Lidocaine 1% WITH epi Laceration details:    Location:  Leg   Leg location:  R lower leg   Length (cm):  6   Depth (mm):  5 Pre-procedure details:    Preparation:  Patient was prepped and draped in usual sterile fashion Exploration:    Imaging outcome: foreign body not noted     Wound exploration: entire depth of wound visualized     Wound extent: no foreign bodies/material noted, no tendon damage noted and no vascular damage noted     Contaminated: no   Treatment:    Area cleansed with:  Povidone-iodine   Amount of cleaning:  Standard   Irrigation solution:  Sterile saline   Irrigation volume:  20 ml   Irrigation method:  Syringe   Visualized foreign bodies/material removed: no     Debridement:  None   Undermining:  Minimal   Scar revision: no     Layers/structures repaired:  Deep subcutaneous Skin repair:    Repair method:  Sutures   Suture size:  4-0   Suture material:  Prolene   Suture technique:  Simple interrupted   Number of sutures:  13 Approximation:    Approximation:  Close Repair type:    Repair type:  Intermediate Post-procedure details:    Dressing:  Antibiotic ointment   Procedure completion:  Tolerated  (including critical care time)  Medications Ordered in UC Medications - No data to display  Initial Impression / Assessment and Plan / UC Course  I have reviewed the triage vital signs and the nursing notes.  Pertinent labs & imaging results that were available during my care of the patient were reviewed by me and considered in my medical decision making (see chart for details).   Laceration to right shin with partial skin flap.  The patient presents with a nonadherent gauze over the wound.  After removal of the nonadherent gauze I found a gel/gum like material on his skin which was no longer over the wound  itself.  There was no significant bleeding occurring upon his arrival.     Final Clinical Impressions(s) / UC Diagnoses   Final diagnoses:  None   Discharge Instructions   None    ED Prescriptions   None    PDMP not reviewed this encounter.   Rose Phi, [place] 11/25/21 1114

## 2021-11-25 NOTE — ED Triage Notes (Addendum)
Pt. Was doing some home renovations yesterday in his attic when he injured himself with plywood on his right shin. Pt. Has tx himself with peroxide and bandaged it himself.

## 2021-11-25 NOTE — Discharge Instructions (Signed)
Keep dressing in place for 1 full day.  You may remove the dressing after 1 day however I recommend keeping it covered with a Band-Aid or replacement dressing.  Do not allow the wound to get wet for 3 full days.  After 3 days you may shower or wash the wound.  Do not scrub it with a cloth.  You may gently wash it with soap on your hand.  Do not soak in a tub.  Return in 7 to 10 days to have the sutures removed.  Do not soak in a tub until sutures are removed.  Follow up with your primary care provider as needed.

## 2021-11-26 LAB — PSA: Prostate Specific Ag, Serum: 19.3 ng/mL — ABNORMAL HIGH (ref 0.0–4.0)

## 2021-11-28 ENCOUNTER — Other Ambulatory Visit: Payer: Self-pay

## 2021-11-28 ENCOUNTER — Ambulatory Visit
Admission: RE | Admit: 2021-11-28 | Discharge: 2021-11-28 | Disposition: A | Discharge status: Home / Self Care | Payer: PPO | Source: Ambulatory Visit | Attending: Internal Medicine | Admitting: Internal Medicine

## 2021-11-28 ENCOUNTER — Emergency Department: Payer: PPO

## 2021-11-28 ENCOUNTER — Inpatient Hospital Stay
Admission: EM | Admit: 2021-11-28 | Discharge: 2021-12-07 | DRG: 901 | Disposition: A | Discharge status: Skilled Nursing Facility | Payer: PPO | Attending: Internal Medicine | Admitting: Internal Medicine

## 2021-11-28 VITALS — BP 109/67 | HR 97 | Temp 98.0°F | Resp 18 | Ht 74.0 in | Wt 176.4 lb

## 2021-11-28 DIAGNOSIS — Z8249 Family history of ischemic heart disease and other diseases of the circulatory system: Secondary | ICD-10-CM | POA: Diagnosis not present

## 2021-11-28 DIAGNOSIS — M6281 Muscle weakness (generalized): Secondary | ICD-10-CM | POA: Diagnosis not present

## 2021-11-28 DIAGNOSIS — R262 Difficulty in walking, not elsewhere classified: Secondary | ICD-10-CM | POA: Diagnosis not present

## 2021-11-28 DIAGNOSIS — J449 Chronic obstructive pulmonary disease, unspecified: Secondary | ICD-10-CM | POA: Diagnosis not present

## 2021-11-28 DIAGNOSIS — G8929 Other chronic pain: Secondary | ICD-10-CM | POA: Diagnosis present

## 2021-11-28 DIAGNOSIS — F32A Depression, unspecified: Secondary | ICD-10-CM | POA: Diagnosis not present

## 2021-11-28 DIAGNOSIS — L03115 Cellulitis of right lower limb: Secondary | ICD-10-CM

## 2021-11-28 DIAGNOSIS — A419 Sepsis, unspecified organism: Secondary | ICD-10-CM | POA: Diagnosis not present

## 2021-11-28 DIAGNOSIS — M25461 Effusion, right knee: Secondary | ICD-10-CM | POA: Diagnosis present

## 2021-11-28 DIAGNOSIS — S81801D Unspecified open wound, right lower leg, subsequent encounter: Secondary | ICD-10-CM | POA: Diagnosis not present

## 2021-11-28 DIAGNOSIS — B9561 Methicillin susceptible Staphylococcus aureus infection as the cause of diseases classified elsewhere: Secondary | ICD-10-CM | POA: Diagnosis not present

## 2021-11-28 DIAGNOSIS — S81811A Laceration without foreign body, right lower leg, initial encounter: Secondary | ICD-10-CM | POA: Diagnosis not present

## 2021-11-28 DIAGNOSIS — E46 Unspecified protein-calorie malnutrition: Secondary | ICD-10-CM | POA: Diagnosis not present

## 2021-11-28 DIAGNOSIS — E785 Hyperlipidemia, unspecified: Secondary | ICD-10-CM | POA: Diagnosis not present

## 2021-11-28 DIAGNOSIS — G629 Polyneuropathy, unspecified: Secondary | ICD-10-CM | POA: Diagnosis not present

## 2021-11-28 DIAGNOSIS — Z79899 Other long term (current) drug therapy: Secondary | ICD-10-CM

## 2021-11-28 DIAGNOSIS — Z808 Family history of malignant neoplasm of other organs or systems: Secondary | ICD-10-CM

## 2021-11-28 DIAGNOSIS — Z86006 Personal history of melanoma in-situ: Secondary | ICD-10-CM | POA: Diagnosis not present

## 2021-11-28 DIAGNOSIS — K219 Gastro-esophageal reflux disease without esophagitis: Secondary | ICD-10-CM

## 2021-11-28 DIAGNOSIS — Z96651 Presence of right artificial knee joint: Secondary | ICD-10-CM | POA: Diagnosis not present

## 2021-11-28 DIAGNOSIS — N179 Acute kidney failure, unspecified: Secondary | ICD-10-CM | POA: Diagnosis not present

## 2021-11-28 DIAGNOSIS — Z8719 Personal history of other diseases of the digestive system: Secondary | ICD-10-CM | POA: Diagnosis not present

## 2021-11-28 DIAGNOSIS — S81811D Laceration without foreign body, right lower leg, subsequent encounter: Secondary | ICD-10-CM | POA: Diagnosis not present

## 2021-11-28 DIAGNOSIS — R2681 Unsteadiness on feet: Secondary | ICD-10-CM | POA: Diagnosis not present

## 2021-11-28 DIAGNOSIS — Z23 Encounter for immunization: Secondary | ICD-10-CM

## 2021-11-28 DIAGNOSIS — N183 Chronic kidney disease, stage 3 unspecified: Secondary | ICD-10-CM | POA: Diagnosis not present

## 2021-11-28 DIAGNOSIS — R6520 Severe sepsis without septic shock: Secondary | ICD-10-CM

## 2021-11-28 DIAGNOSIS — R600 Localized edema: Secondary | ICD-10-CM | POA: Diagnosis not present

## 2021-11-28 DIAGNOSIS — L089 Local infection of the skin and subcutaneous tissue, unspecified: Secondary | ICD-10-CM

## 2021-11-28 DIAGNOSIS — Z85820 Personal history of malignant melanoma of skin: Secondary | ICD-10-CM | POA: Diagnosis not present

## 2021-11-28 DIAGNOSIS — T148XXA Other injury of unspecified body region, initial encounter: Secondary | ICD-10-CM

## 2021-11-28 DIAGNOSIS — E440 Moderate protein-calorie malnutrition: Secondary | ICD-10-CM | POA: Diagnosis not present

## 2021-11-28 DIAGNOSIS — A4101 Sepsis due to Methicillin susceptible Staphylococcus aureus: Secondary | ICD-10-CM | POA: Diagnosis not present

## 2021-11-28 DIAGNOSIS — Y92018 Other place in single-family (private) house as the place of occurrence of the external cause: Secondary | ICD-10-CM | POA: Diagnosis not present

## 2021-11-28 DIAGNOSIS — E559 Vitamin D deficiency, unspecified: Secondary | ICD-10-CM | POA: Diagnosis not present

## 2021-11-28 DIAGNOSIS — E034 Atrophy of thyroid (acquired): Secondary | ICD-10-CM | POA: Diagnosis not present

## 2021-11-28 DIAGNOSIS — Z79890 Hormone replacement therapy (postmenopausal): Secondary | ICD-10-CM

## 2021-11-28 DIAGNOSIS — R278 Other lack of coordination: Secondary | ICD-10-CM | POA: Diagnosis not present

## 2021-11-28 DIAGNOSIS — A4901 Methicillin susceptible Staphylococcus aureus infection, unspecified site: Secondary | ICD-10-CM

## 2021-11-28 DIAGNOSIS — T8149XA Infection following a procedure, other surgical site, initial encounter: Secondary | ICD-10-CM | POA: Diagnosis not present

## 2021-11-28 DIAGNOSIS — Z888 Allergy status to other drugs, medicaments and biological substances status: Secondary | ICD-10-CM

## 2021-11-28 DIAGNOSIS — G309 Alzheimer's disease, unspecified: Secondary | ICD-10-CM | POA: Diagnosis present

## 2021-11-28 DIAGNOSIS — W19XXXA Unspecified fall, initial encounter: Secondary | ICD-10-CM | POA: Diagnosis not present

## 2021-11-28 DIAGNOSIS — Z6822 Body mass index (BMI) 22.0-22.9, adult: Secondary | ICD-10-CM

## 2021-11-28 DIAGNOSIS — L0390 Cellulitis, unspecified: Secondary | ICD-10-CM | POA: Diagnosis not present

## 2021-11-28 DIAGNOSIS — F0283 Dementia in other diseases classified elsewhere, unspecified severity, with mood disturbance: Secondary | ICD-10-CM

## 2021-11-28 DIAGNOSIS — G250 Essential tremor: Secondary | ICD-10-CM | POA: Diagnosis not present

## 2021-11-28 DIAGNOSIS — Z741 Need for assistance with personal care: Secondary | ICD-10-CM | POA: Diagnosis not present

## 2021-11-28 DIAGNOSIS — E039 Hypothyroidism, unspecified: Secondary | ICD-10-CM | POA: Diagnosis present

## 2021-11-28 DIAGNOSIS — Z87442 Personal history of urinary calculi: Secondary | ICD-10-CM

## 2021-11-28 DIAGNOSIS — T798XXA Other early complications of trauma, initial encounter: Secondary | ICD-10-CM | POA: Diagnosis not present

## 2021-11-28 DIAGNOSIS — B9562 Methicillin resistant Staphylococcus aureus infection as the cause of diseases classified elsewhere: Secondary | ICD-10-CM | POA: Diagnosis not present

## 2021-11-28 DIAGNOSIS — R4189 Other symptoms and signs involving cognitive functions and awareness: Secondary | ICD-10-CM | POA: Diagnosis not present

## 2021-11-28 DIAGNOSIS — I2510 Atherosclerotic heart disease of native coronary artery without angina pectoris: Secondary | ICD-10-CM | POA: Diagnosis not present

## 2021-11-28 DIAGNOSIS — Z880 Allergy status to penicillin: Secondary | ICD-10-CM

## 2021-11-28 HISTORY — DX: Sepsis, unspecified organism: R65.20

## 2021-11-28 HISTORY — DX: Sepsis, unspecified organism: A41.9

## 2021-11-28 LAB — CBC
HCT: 45.3 % (ref 39.0–52.0)
Hemoglobin: 15.1 g/dL (ref 13.0–17.0)
MCH: 32 pg (ref 26.0–34.0)
MCHC: 33.3 g/dL (ref 30.0–36.0)
MCV: 96 fL (ref 80.0–100.0)
Platelets: 251 10*3/uL (ref 150–400)
RBC: 4.72 MIL/uL (ref 4.22–5.81)
RDW: 13.3 % (ref 11.5–15.5)
WBC: 21.6 10*3/uL — ABNORMAL HIGH (ref 4.0–10.5)
nRBC: 0 % (ref 0.0–0.2)

## 2021-11-28 LAB — BASIC METABOLIC PANEL
Anion gap: 11 (ref 5–15)
BUN: 35 mg/dL — ABNORMAL HIGH (ref 8–23)
CO2: 24 mmol/L (ref 22–32)
Calcium: 9.1 mg/dL (ref 8.9–10.3)
Chloride: 102 mmol/L (ref 98–111)
Creatinine, Ser: 1.72 mg/dL — ABNORMAL HIGH (ref 0.61–1.24)
GFR, Estimated: 37 mL/min — ABNORMAL LOW (ref >60)
Glucose, Bld: 124 mg/dL — ABNORMAL HIGH (ref 70–99)
Potassium: 4.6 mmol/L (ref 3.5–5.1)
Sodium: 137 mmol/L (ref 135–145)

## 2021-11-28 LAB — LACTIC ACID, PLASMA
Lactic Acid, Venous: 2.1 mmol/L (ref 0.5–1.9)
Lactic Acid, Venous: 2.7 mmol/L (ref 0.5–1.9)

## 2021-11-28 MED ORDER — VITAMIN D 25 MCG (1000 UNIT) PO TABS
2000.0000 [IU] | ORAL_TABLET | Freq: Every day | ORAL | Status: DC
  Administered 2021-11-29 – 2021-12-06 (×6): 2000 [IU] via ORAL
  Filled 2021-11-28 (×8): qty 2

## 2021-11-28 MED ORDER — MAGNESIUM OXIDE 400 MG PO TABS
400.0000 mg | ORAL_TABLET | Freq: Every day | ORAL | Status: DC
  Administered 2021-11-29 – 2021-12-07 (×7): 400 mg via ORAL
  Filled 2021-11-28 (×16): qty 1

## 2021-11-28 MED ORDER — ENOXAPARIN SODIUM 40 MG/0.4ML IJ SOSY
40.0000 mg | PREFILLED_SYRINGE | INTRAMUSCULAR | Status: DC
  Administered 2021-11-28 – 2021-12-06 (×8): 40 mg via SUBCUTANEOUS
  Filled 2021-11-28 (×9): qty 0.4

## 2021-11-28 MED ORDER — FENTANYL CITRATE PF 50 MCG/ML IJ SOSY
50.0000 ug | PREFILLED_SYRINGE | Freq: Once | INTRAMUSCULAR | Status: AC
  Administered 2021-11-28: 50 ug via INTRAVENOUS
  Filled 2021-11-28: qty 1

## 2021-11-28 MED ORDER — ONDANSETRON HCL 4 MG/2ML IJ SOLN: 4.0000 mg | Freq: Four times a day (QID) | INTRAMUSCULAR | Status: DC | PRN

## 2021-11-28 MED ORDER — DONEPEZIL HCL 5 MG PO TABS
10.0000 mg | ORAL_TABLET | Freq: Every morning | ORAL | Status: DC
  Administered 2021-11-29 – 2021-12-07 (×8): 10 mg via ORAL
  Filled 2021-11-28 (×9): qty 2

## 2021-11-28 MED ORDER — LACTATED RINGERS IV BOLUS
1000.0000 mL | Freq: Once | INTRAVENOUS | Status: AC
  Administered 2021-11-28: 1000 mL via INTRAVENOUS

## 2021-11-28 MED ORDER — SODIUM CHLORIDE 0.9 % IV SOLN: INTRAVENOUS | Status: DC

## 2021-11-28 MED ORDER — BUPROPION HCL ER (XL) 150 MG PO TB24
150.0000 mg | ORAL_TABLET | Freq: Every day | ORAL | Status: DC
  Administered 2021-11-29 – 2021-12-07 (×9): 150 mg via ORAL
  Filled 2021-11-28 (×9): qty 1

## 2021-11-28 MED ORDER — VANCOMYCIN HCL IN DEXTROSE 1-5 GM/200ML-% IV SOLN
1000.0000 mg | INTRAVENOUS | Status: DC
  Administered 2021-11-29: 1000 mg via INTRAVENOUS
  Filled 2021-11-28 (×2): qty 200

## 2021-11-28 MED ORDER — TRAZODONE HCL 50 MG PO TABS
25.0000 mg | ORAL_TABLET | Freq: Every evening | ORAL | Status: DC | PRN
  Administered 2021-11-28: 25 mg via ORAL
  Filled 2021-11-28: qty 1

## 2021-11-28 MED ORDER — VANCOMYCIN HCL IN DEXTROSE 1-5 GM/200ML-% IV SOLN: 1000.0000 mg | Freq: Once | INTRAVENOUS | Status: DC

## 2021-11-28 MED ORDER — EZETIMIBE 10 MG PO TABS
10.0000 mg | ORAL_TABLET | Freq: Every day | ORAL | Status: DC
  Administered 2021-11-29 – 2021-12-07 (×9): 10 mg via ORAL
  Filled 2021-11-28 (×10): qty 1

## 2021-11-28 MED ORDER — LEVOTHYROXINE SODIUM 100 MCG PO TABS
100.0000 ug | ORAL_TABLET | Freq: Every day | ORAL | Status: DC
  Administered 2021-11-29 – 2021-12-07 (×9): 100 ug via ORAL
  Filled 2021-11-28 (×9): qty 1

## 2021-11-28 MED ORDER — ONDANSETRON HCL 4 MG PO TABS: 4.0000 mg | ORAL_TABLET | Freq: Four times a day (QID) | ORAL | Status: DC | PRN

## 2021-11-28 MED ORDER — SERTRALINE HCL 50 MG PO TABS: 50.0000 mg | ORAL_TABLET | Freq: Every day | ORAL | Status: DC

## 2021-11-28 MED ORDER — VITAMIN C 500 MG PO TABS
500.0000 mg | ORAL_TABLET | Freq: Every day | ORAL | Status: DC
  Administered 2021-11-29: 500 mg via ORAL
  Filled 2021-11-28: qty 1

## 2021-11-28 MED ORDER — FENTANYL CITRATE PF 50 MCG/ML IJ SOSY
12.5000 ug | PREFILLED_SYRINGE | INTRAMUSCULAR | Status: DC | PRN
  Administered 2021-11-28 – 2021-11-29 (×2): 25 ug via INTRAVENOUS
  Filled 2021-11-28 (×2): qty 1

## 2021-11-28 MED ORDER — NALOXONE HCL 0.4 MG/ML IJ SOLN: 0.4000 mg | INTRAMUSCULAR | Status: DC | PRN

## 2021-11-28 MED ORDER — VANCOMYCIN HCL IN DEXTROSE 1-5 GM/200ML-% IV SOLN
1000.0000 mg | Freq: Once | INTRAVENOUS | Status: AC
  Administered 2021-11-28: 1000 mg via INTRAVENOUS
  Filled 2021-11-28: qty 200

## 2021-11-28 MED ORDER — OXYCODONE HCL 5 MG PO TABS
5.0000 mg | ORAL_TABLET | Freq: Once | ORAL | Status: AC
  Administered 2021-11-28: 5 mg via ORAL
  Filled 2021-11-28: qty 1

## 2021-11-28 MED ORDER — NEOMYCIN-POLYMYXIN-HC 3.5-10000-1 OT SUSP
4.0000 [drp] | Freq: Three times a day (TID) | OTIC | Status: DC
  Administered 2021-11-29: 4 [drp] via OTIC
  Filled 2021-11-28: qty 10

## 2021-11-28 MED ORDER — ROSUVASTATIN CALCIUM 10 MG PO TABS
10.0000 mg | ORAL_TABLET | Freq: Every day | ORAL | Status: DC
  Administered 2021-11-29 – 2021-12-07 (×4): 10 mg via ORAL
  Filled 2021-11-28 (×8): qty 1

## 2021-11-28 MED ORDER — MAGNESIUM HYDROXIDE 400 MG/5ML PO SUSP: 30.0000 mL | Freq: Every day | ORAL | Status: DC | PRN

## 2021-11-28 MED ORDER — RISAQUAD PO CAPS
1.0000 | ORAL_CAPSULE | Freq: Every day | ORAL | Status: DC
  Administered 2021-11-29 – 2021-12-07 (×9): 1 via ORAL
  Filled 2021-11-28 (×9): qty 1

## 2021-11-28 MED ORDER — ACETAMINOPHEN 325 MG PO TABS
650.0000 mg | ORAL_TABLET | Freq: Four times a day (QID) | ORAL | Status: DC | PRN
  Administered 2021-12-01 – 2021-12-02 (×2): 650 mg via ORAL
  Filled 2021-11-28 (×2): qty 2

## 2021-11-28 MED ORDER — SODIUM CHLORIDE 0.9 % IV SOLN
2.0000 g | Freq: Two times a day (BID) | INTRAVENOUS | Status: DC
  Administered 2021-11-28 – 2021-11-30 (×5): 2 g via INTRAVENOUS
  Filled 2021-11-28: qty 2
  Filled 2021-11-28 (×3): qty 12.5
  Filled 2021-11-28: qty 2
  Filled 2021-11-28: qty 12.5

## 2021-11-28 MED ORDER — ACETAMINOPHEN 650 MG RE SUPP: 650.0000 mg | Freq: Four times a day (QID) | RECTAL | Status: DC | PRN

## 2021-11-28 MED ORDER — SODIUM CHLORIDE 0.9 % IV SOLN: 2.0000 g | Freq: Once | INTRAVENOUS | Status: DC

## 2021-11-28 MED ORDER — CYANOCOBALAMIN 500 MCG PO TABS
500.0000 ug | ORAL_TABLET | Freq: Every day | ORAL | Status: DC
  Administered 2021-11-29 – 2021-12-06 (×6): 500 ug via ORAL
  Filled 2021-11-28 (×8): qty 1

## 2021-11-28 MED ORDER — CYCLOSPORINE 0.05 % OP EMUL
1.0000 [drp] | Freq: Two times a day (BID) | OPHTHALMIC | Status: DC
  Administered 2021-11-28 – 2021-12-07 (×17): 1 [drp] via OPHTHALMIC
  Filled 2021-11-28 (×19): qty 30

## 2021-11-28 NOTE — Discharge Instructions (Addendum)
Go to the emergency department for evaluation of the infected wound on your right lower leg.

## 2021-11-28 NOTE — Assessment & Plan Note (Signed)
-   We will continue his Synthroid. 

## 2021-11-28 NOTE — Assessment & Plan Note (Addendum)
-   We will continue Aricept, Zoloft and Wellbutrin XL.

## 2021-11-28 NOTE — Assessment & Plan Note (Addendum)
-   The patient admitted to a medical telemetry bed. - Sepsis is is due to right anterior leg cellulitis due to infected wound. - Sepsis manifested by significant leukocytosis, heart rate of 97 and respiratory rate of 22. - We will continue antibiotic with IV cefepime and vancomycin. - Pain management will be provided. - His sutures are intact and he had no fluctuation to suggest an abscess or evidence of fluid accumulation on his x-ray.. - Warm compresses will be applied. - General surgery consultation will be obtained to assess the need to remove sutures.

## 2021-11-28 NOTE — Progress Notes (Signed)
Pharmacy Antibiotic Note  Theodore [name] is a 88 y.o. male admitted on 11/28/2021 with cellulitis.  Pharmacy has been consulted for vancomycin and cefepime dosing. Renal function appears to be lower than apparent baseline. In the ED he received 1000 mg IV vancomycin  Plan:  1) start cefepime 2 grams IV every 12 hours  2) start vancomycin 1000 mg IV Q 24 hrs.  Goal AUC 400-550. Expected AUC: 556 SCr used: 1.72 mg/dL Daily SCr while on IV vancomycin Ke 0.031 h-1, T1/2 22.2h     Temp (24hrs), Avg:98.8 F (37.1 C), Min:98 F (36.7 C), Max:99.8 F (37.7 C)  Recent Labs  Lab 11/28/21 1204  WBC 21.6*  CREATININE 1.72*  LATICACIDVEN 2.1*    Estimated Creatinine Clearance: 32.3 mL/min (A) (by C-G formula based on SCr of 1.72 mg/dL (H)).    Allergies  Allergen Reactions   Penicillins Rash and Other (See Comments)    Other reaction(s): Other (see comments) Other reaction(s): UNKNOWN   Lexapro [Escitalopram] Other (See Comments)    Increased hand tremor    Antimicrobials this admission: 09/18 cefepime >>  09/18 vancomycin >>   Microbiology results: 09/18 BCx: pending  Thank you for allowing pharmacy to be a part of this patient's care.  Dallie Piles 11/28/2021 3:01 PM

## 2021-11-28 NOTE — Assessment & Plan Note (Signed)
-   This is based on a lactic acid of 2.1 and AKI. - Management as above.

## 2021-11-28 NOTE — Assessment & Plan Note (Signed)
-  Continue Zetia and Lipitor 

## 2021-11-28 NOTE — ED Notes (Signed)
Patient is being discharged from the Urgent Care and sent to the Emergency Department via POV . Per Barkley Boards, NP, patient is in need of higher level of care due to infected wound present to right lower leg. Patient is aware and verbalizes understanding of plan of care.  Vitals:   11/28/21 1015  BP: 109/67  Pulse: 97  Resp: 18  Temp: 98 F (36.7 C)  SpO2: 97%

## 2021-11-28 NOTE — ED Notes (Addendum)
Date and time results received: 11/28/21 1240   Test: Lactic acid Critical Value: 2.1  MD will be notified.

## 2021-11-28 NOTE — H&P (Signed)
Theodore [name]   PATIENT NAME: Theodore [name]    MR#:  [id number]  DATE OF BIRTH:  Dec 22, 1929  DATE OF ADMISSION:  11/28/2021  PRIMARY CARE PHYSICIAN: [name], MD   Patient is coming from: Home  REQUESTING/REFERRING PHYSICIAN: Nance Pear, MD  CHIEF COMPLAINT:   Chief Complaint  Patient presents with   cellulitis    HISTORY OF PRESENT ILLNESS:  Theodore [name] is a 88 y.o. Caucasian male with medical history significant for COPD, depression, CAD, hypothyroidism, and osteoarthritis, who presented to the ER with acute onset of right leg swelling with associated erythema and pain.  Patient fell on the edge of a plywood on Thursday.  Was seen in urgent care on Friday and had 13 stitches for laceration repair.  He was not given any antibiotics per his report.  Since then his right leg has been experiencing worsening of swelling with associated tenderness, erythema and pain.  No fever or chills at home until this morning when his temperature was 99-100 per his wife.  He d was having chills in the ER.  No nausea or vomiting.  No chest pain or palpitations.  No headache or dizziness or blurred vision.  No dysuria, oliguria or hematuria or flank pain.  No cough or wheezing or hemoptysis.  ED Course: When he came to the ER, vital signs were within normal.  Labs revealed a glucose of 124 and a BUN of 35 with a creatinine 1.72 that were previously normal.  Lactic acid was 2.1 and CBC showed significant leukocytosis 21.6.  Blood cultures were drawn.  Imaging:   Right tib-fib x-ray showed his total right knee arthroplasty with no evidence for hardware failure and no evidence for cortical erosion to indicate radiographic evidence of acute osteomyelitis.  The patient was given 50 mcg of IV fentanyl, 1 L bolus of IV lactated Ringer in 1 g of IV vancomycin.  He will be admitted to a medical telemetry bed for further evaluation and management.  PAST MEDICAL HISTORY:   Past Medical History:   Diagnosis Date   Actinic keratosis    Arthritis    knees   Asymptomatic Sinus bradycardia    a. 05/2016 Zio: Avg HR 61 (41-167).   Benign essential tremor    Cancer ([place]) 2002   melanoma right ear   Colon polyp    COPD (chronic obstructive pulmonary disease) ([place])    pt said he believes it is a misdiagnosis    Coronary artery calcification seen on CT scan    a. 06/2016 CTA Chest: cor Ca2+; b. 05/2017 MV: EF >65%. No ischemia/infarct.   Depression    History of echocardiogram    a. 04/2016 Echo: EF 60-65%, mild conc LVH. Nl PASP.   History of kidney stones    Hx of dysplastic nevus 05/07/2017   L anterior shoulder, severe   Hx of dysplastic nevus 09/06/2020   R lower sternum, moderate atypia   Hypothyroidism    Hypothyroidism    Melanoma ([place]) 1990's per pt   R ear   Melanoma in situ ([place]) 01/12/2014   left jaw   Palpitations    a. 05/2016 Zio: Avg HR 61 (41-167). 9 SVT runs (fastest 167 - 5 beats; longest 8 beats - 101 bpm). Rare PACs/PVCs.   PVC's (premature ventricular contractions)    Rhinitis    Wears dentures    partial upper and lower   Wears hearing aid in both ears  PAST SURGICAL HISTORY:   Past Surgical History:  Procedure Laterality Date   ADENOIDECTOMY     CATARACT EXTRACTION W/PHACO Left 10/22/2017   Procedure: CATARACT EXTRACTION PHACO AND INTRAOCULAR LENS PLACEMENT ([place])  LEFT;  Surgeon: Eulogio Bear, MD;  Location: [facility];  Service: Ophthalmology;  Laterality: Left;   CATARACT EXTRACTION W/PHACO Right 11/20/2017   Procedure: CATARACT EXTRACTION PHACO AND INTRAOCULAR LENS PLACEMENT ([facility]) RIGHT;  Surgeon: Eulogio Bear, MD;  Location: [facility];  Service: Ophthalmology;  Laterality: Right;   ESOPHAGOGASTRODUODENOSCOPY (EGD) WITH PROPOFOL N/A 10/31/2016   Procedure: ESOPHAGOGASTRODUODENOSCOPY (EGD) WITH PROPOFOL;  Surgeon: Lollie Sails, MD;  Location: [facility] ENDOSCOPY;  Service: Endoscopy;  Laterality: N/A;   JOINT  REPLACEMENT Right 2003   knee   knee meniscus repair Right    MELANOMA EXCISION Right    ear. Followed by Dr. Nehemiah Massed   PATELLECTOMY Bilateral    TONSILLECTOMY      SOCIAL HISTORY:   Social History   Tobacco Use   Smoking status: Never   Smokeless tobacco: Never  Substance Use Topics   Alcohol use: Yes    Comment: 1 glass of wine daily    FAMILY HISTORY:   Family History  Problem Relation Age of Onset   Hypertension Mother    Heart disease Mother        CHF   Heart disease Father    Cancer Sister        melanoma    DRUG ALLERGIES:   Allergies  Allergen Reactions   Penicillins Rash and Other (See Comments)    Other reaction(s): Other (see comments) Other reaction(s): UNKNOWN   Lexapro [Escitalopram] Other (See Comments)    Increased hand tremor    REVIEW OF SYSTEMS:   ROS As per history of present illness. All pertinent systems were reviewed above. Constitutional, HEENT, cardiovascular, respiratory, GI, GU, musculoskeletal, neuro, psychiatric, endocrine, integumentary and hematologic systems were reviewed and are otherwise negative/unremarkable except for positive findings mentioned above in the HPI.   MEDICATIONS AT HOME:   Prior to Admission medications   Medication Sig Start Date End Date Taking? Authorizing Provider  Alpha-Lipoic Acid 600 MG CAPS Take by mouth.    [provider]  ascorbic Acid (VITAMIN C) 500 MG CPCR Take by mouth.    [provider]  buPROPion (WELLBUTRIN XL) 150 MG 24 hr tablet TAKE 1 TABLET BY MOUTH EVERY DAY 05/25/21   Dutch Quint B, FNP  Cholecalciferol (VITAMIN D3) 25 MCG (1000 UT) CAPS Take 2 capsules (2,000 Units total) by mouth daily. 08/29/21   [name], MD  Coenzyme Q10 100 MG capsule Take by mouth.    [provider]  cycloSPORINE (RESTASIS) 0.05 % ophthalmic emulsion Restasis 0.05 % eye drops in a dropperette  INSTILL 1 DROP INTO BOTH EYES TWICE A DAY    [provider]   donepezil (ARICEPT) 10 MG tablet Take 10 mg by mouth every morning. 05/09/21   [provider]  ezetimibe (ZETIA) 10 MG tablet TAKE 1 TABLET BY MOUTH EVERY DAY 10/25/21   Minna Merritts, MD  levothyroxine (SYNTHROID) 100 MCG tablet TAKE 1 TABLET BY MOUTH EVERY DAY BEFORE BREAKFAST 09/20/21   [name], MD  magnesium oxide (MAG-OX) 400 MG tablet Take 400 mg by mouth daily.    [provider]  neomycin-polymyxin-hydrocortisone (CORTISPORIN) 3.5-10000-1 OTIC suspension Place 4 drops into the left ear 3 (three) times daily. 10/23/21   Sharion Balloon, NP  OVER THE COUNTER MEDICATION Take by mouth daily. Hemp oil    [provider]  Probiotic Product (PROBIOTIC + TURMERIC EXTRACT) 400 MG CAPS Take by mouth.    [provider]  Probiotic Product (PROBIOTIC ACIDOPHILUS) CAPS Take 1 capsule by mouth daily.    [provider]  rosuvastatin (CRESTOR) 10 MG tablet Take 1 tablet (10 mg total) by mouth daily. 03/15/21   [name], MD  sertraline (ZOLOFT) 50 MG tablet TAKE 1 TABLET (50 MG TOTAL) BY MOUTH DAILY. WITH A MEAL. Patient not taking: Reported on 09/30/2021 12/29/20   [name], MD  testosterone cypionate (DEPOTESTOSTERONE CYPIONATE) 200 MG/ML injection INJECT 0.5 ML (100 MG TOTAL) INTO THE MUSCLE EVERY 14 DAYS. 11/13/21   [name], MD  Turmeric 400 MG CAPS Take by mouth.    [provider]  vitamin B-12 (CYANOCOBALAMIN) 500 MCG tablet Take 1 tablet by mouth daily.    [provider]      VITAL SIGNS:  Blood pressure 129/64, pulse 90, temperature 99.8 F (37.7 C), temperature source Rectal, resp. rate 20, SpO2 97 %.  PHYSICAL EXAMINATION:  Physical Exam  GENERAL:  88 y.o.-year-old Caucasian male patient lying in the bed with no acute distress.  EYES: Pupils equal, round, reactive to light and accommodation. No scleral icterus. Extraocular muscles intact.  HEENT: Head atraumatic, normocephalic. Oropharynx and  nasopharynx clear.  NECK:  Supple, no jugular venous distention. No thyroid enlargement, no tenderness.  LUNGS: Normal breath sounds bilaterally, no wheezing, rales,rhonchi or crepitation. No use of accessory muscles of respiration.  CARDIOVASCULAR: Regular rate and rhythm, S1, S2 normal. No murmurs, rubs, or gallops.  ABDOMEN: Soft, nondistended, nontender. Bowel sounds present. No organomegaly or mass.  EXTREMITIES: No pedal edema, cyanosis, or clubbing.  NEUROLOGIC: Cranial nerves II through XII are intact. Muscle strength 5/5 in all extremities. Sensation intact. Gait not checked.  PSYCHIATRIC: The patient is alert and oriented x 3.  Normal affect and good eye contact. SKIN: Right anterior leg sutured wound with surrounding erythema and swelling with associated tenderness, warmth and induration.  I could not palpate a fluctuation.    LABORATORY PANEL:   CBC Recent Labs  Lab 11/28/21 1204  WBC 21.6*  HGB 15.1  HCT 45.3  PLT 251   ------------------------------------------------------------------------------------------------------------------  Chemistries  Recent Labs  Lab 11/28/21 1204  NA 137  K 4.6  CL 102  CO2 24  GLUCOSE 124*  BUN 35*  CREATININE 1.72*  CALCIUM 9.1   ------------------------------------------------------------------------------------------------------------------  Cardiac Enzymes No results for input(s): "TROPONINI" in the last 168 hours. ------------------------------------------------------------------------------------------------------------------  RADIOLOGY:  DG Tibia/Fibula Right  Result Date: 11/28/2021 CLINICAL DATA:  Cellulitis right lower leg. Infection. Leg injury a few days ago and had stitches placed. Bandage in place at this time. EXAM: RIGHT TIBIA AND FIBULA - 2 VIEW COMPARISON:  None Available. FINDINGS: Postsurgical changes of total right knee arthroplasty. The superior aspect of the distal femoral hardware is seen on lateral  view only. No perihardware lucency is seen to indicate hardware failure or loosening. There are 2 small chronic ossicles at the superior aspect of the tibial tubercle. No overlying anterior soft tissue swelling. Mild distal medial and lateral malleolar degenerative osteophytes at the ankle. No cortical erosion. No acute fracture is seen. No dislocation. Mild vascular calcifications. IMPRESSION: 1. Status post total right knee arthroplasty. No evidence of hardware failure. 2. No cortical erosion to indicate radiographic evidence of acute osteomyelitis. Electronically Signed   By: Jori Moll  Viola M.D.   On: 11/28/2021 13:41      IMPRESSION AND PLAN:  Assessment and Plan: * Sepsis due to cellulitis [facility]) - The patient admitted to a medical telemetry bed. - Sepsis is is due to right anterior leg cellulitis due to infected wound. - Sepsis manifested by significant leukocytosis, heart rate of 97 and respiratory rate of 22. - We will continue antibiotic with IV cefepime and vancomycin. - Pain management will be provided. - His sutures are intact and he had no fluctuation to suggest an abscess or evidence of fluid accumulation on his x-ray.. - Warm compresses will be applied. - General surgery consultation will be obtained to assess the need to remove sutures.  Severe sepsis ([place]) - This is based on a lactic acid of 2.1 and AKI. - Management as above.  Dyslipidemia We will continue statin therapy and Zetia.  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  GERD without esophagitis - We will continue PPI therapy.   DVT prophylaxis: Lovenox.  Advanced Care Planning:  Code Status: full code.  Family Communication:  The plan of care was discussed in details with the patient (and family). I answered all questions. The patient agreed to proceed with the above mentioned plan. Further  management will depend upon hospital course. Disposition Plan: Back to previous home environment Consults called: none.  All the records are reviewed and case discussed with ED provider.  Status is: Inpatient    At the time of the admission, it appears that the appropriate admission status for this patient is inpatient.  This is judged to be reasonable and necessary in order to provide the required intensity of service to ensure the patient's safety given the presenting symptoms, physical exam findings and initial radiographic and laboratory data in the context of comorbid conditions.  The patient requires inpatient status due to high intensity of service, high risk of further deterioration and high frequency of surveillance required.  I certify that at the time of admission, it is my clinical judgment that the patient will require inpatient hospital care extending more than 2 midnights.                            Dispo: The patient is from: Home              Anticipated d/c is to: Home              Patient currently is not medically stable to d/c.              Difficult to place patient: No  Christel Mormon M.D on 11/28/2021 at 4:18 PM  [facility]   From 7 PM-7 AM, contact night-coverage [email or web address]  CC: Primary care physician; [name], MD

## 2021-11-28 NOTE — ED Provider Notes (Signed)
[name]    CSN: [id number] Arrival date & time: 11/28/21  4696      History   Chief Complaint Chief Complaint  Patient presents with   Leg Injury    Area of lower leg, where 13 stitches were put in after fall, is red up to knee.  Temperature on Sunday is 99.5. - Entered by patient    HPI JUSITN [name] is a 88 y.o. male.  Accompanied by his wife, patient presents with painful redness around the wound on his right lower leg.  He had a low-grade fever of 100 yesterday.  He also has generalized weakness so severe that he had to have EMS come help him out of his chair yesterday evening.  Patient was seen at this urgent care on 11/25/2021 for a laceration of his leg; 13 sutures placed.  He has been changing the bandage but not washing the wound.  No fever since yesterday afternoon.  The wound is oozing small amount of clear fluid.  He denies focal weakness, chest pain, shortness of breath, or other symptoms.  The history is provided by the patient, the spouse and medical records.    Past Medical History:  Diagnosis Date   Actinic keratosis    Arthritis    knees   Asymptomatic Sinus bradycardia    a. 05/2016 Zio: Avg HR 61 (41-167).   Benign essential tremor    Cancer ([place]) 2002   melanoma right ear   Colon polyp    COPD (chronic obstructive pulmonary disease) ([place])    pt said he believes it is a misdiagnosis    Coronary artery calcification seen on CT scan    a. 06/2016 CTA Chest: cor Ca2+; b. 05/2017 MV: EF >65%. No ischemia/infarct.   Depression    History of echocardiogram    a. 04/2016 Echo: EF 60-65%, mild conc LVH. Nl PASP.   History of kidney stones    Hx of dysplastic nevus 05/07/2017   L anterior shoulder, severe   Hx of dysplastic nevus 09/06/2020   R lower sternum, moderate atypia   Hypothyroidism    Hypothyroidism    Melanoma ([place]) 1990's per pt   R ear   Melanoma in situ ([place]) 01/12/2014   left jaw   Palpitations    a. 05/2016 Zio: Avg HR 61  (41-167). 9 SVT runs (fastest 167 - 5 beats; longest 8 beats - 101 bpm). Rare PACs/PVCs.   PVC's (premature ventricular contractions)    Rhinitis    Wears dentures    partial upper and lower   Wears hearing aid in both ears     Patient Active Problem List   Diagnosis Date Noted   Nonhealing nonsurgical wound limited to breakdown of skin 09/22/2021   Proximal muscle weakness 08/27/2021   Speech disturbance 08/26/2021   Adverse drug reaction, initial encounter 05/04/2021   Left lumbar radiculopathy 04/25/2021   Thoracic aortic atherosclerosis ([place]) 01/17/2021   Dizziness 12/31/2020   Anxiety in acute stress reaction 12/07/2020   CKD (chronic kidney disease) stage 3, GFR 30-59 ml/min ([place]) 12/01/2020   Mild cognitive impairment with memory loss 11/30/2020   Elevated PSA, between 10 and less than 20 ng/ml 11/30/2020   COVID-19 virus infection 04/06/2020   Acquired trigger finger of left middle finger 08/07/2019   Carpal tunnel syndrome of right wrist 07/10/2019   Pain in right hand 06/05/2019   Ulnar neuropathy 06/05/2019   Essential tremor 05/28/2019   Disorder of bursae of shoulder region  01/29/2019   Localized, primary osteoarthritis of shoulder region 01/29/2019   Localized, secondary osteoarthritis of the shoulder region 01/29/2019   Osteoarthritis of knee 01/29/2019   Shoulder joint pain 01/29/2019   Chronic obstructive pulmonary disease ([place]) 09/24/2018   Grief 09/02/2018   Cervical radiculopathy 08/14/2018   Neck pain 08/12/2018   Sinus bradycardia 03/21/2018   DOE (dyspnea on exertion) 03/21/2018   PVC (premature ventricular contraction) 03/21/2018   Weakness of both lower extremities 08/09/2017   Swallowing dysfunction 09/05/2016   Ringworm, body 06/27/2016   Bradycardia 05/10/2016   Coronary artery disease involving native coronary artery of native heart with angina pectoris (HCC)    PAD (peripheral artery disease) ([place])    Asthma 05/09/2016   Anterior knee pain,  left 12/26/2015   Hypothyroidism 07/04/2014   Hyperlipidemia LDL goal <100 07/04/2014   Nonallergic vasomotor rhinitis 07/01/2014   Long-term use of high-risk medication 01/16/2014   Hypertrophy of prostate with urinary obstruction and other lower urinary tract symptoms (LUTS) 08/08/2013   Hypogonadism male 08/08/2013   Vitamin D deficiency 07/31/2013   Depression with anxiety 04/28/2013   Low serum testosterone level 04/28/2013   Actinic keratosis 05/10/2010   History of malignant melanoma of skin 05/10/2010   Inflamed seborrheic keratosis 05/10/2010    Past Surgical History:  Procedure Laterality Date   ADENOIDECTOMY     CATARACT EXTRACTION W/PHACO Left 10/22/2017   Procedure: CATARACT EXTRACTION PHACO AND INTRAOCULAR LENS PLACEMENT ([place])  LEFT;  Surgeon: Eulogio Bear, MD;  Location: [facility];  Service: Ophthalmology;  Laterality: Left;   CATARACT EXTRACTION W/PHACO Right 11/20/2017   Procedure: CATARACT EXTRACTION PHACO AND INTRAOCULAR LENS PLACEMENT ([facility]) RIGHT;  Surgeon: Eulogio Bear, MD;  Location: [facility];  Service: Ophthalmology;  Laterality: Right;   ESOPHAGOGASTRODUODENOSCOPY (EGD) WITH PROPOFOL N/A 10/31/2016   Procedure: ESOPHAGOGASTRODUODENOSCOPY (EGD) WITH PROPOFOL;  Surgeon: Lollie Sails, MD;  Location: [facility];  Service: Endoscopy;  Laterality: N/A;   JOINT REPLACEMENT Right 2003   knee   knee meniscus repair Right    MELANOMA EXCISION Right    ear. Followed by Dr. Nehemiah Massed   PATELLECTOMY Bilateral    TONSILLECTOMY         Home Medications    Prior to Admission medications   Medication Sig Start Date End Date Taking? Authorizing Provider  Alpha-Lipoic Acid 600 MG CAPS Take by mouth.    [provider]  ascorbic Acid (VITAMIN C) 500 MG CPCR Take by mouth.    [provider]  buPROPion (WELLBUTRIN XL) 150 MG 24 hr tablet TAKE 1 TABLET BY MOUTH EVERY DAY 05/25/21   Dutch Quint B, FNP   Cholecalciferol (VITAMIN D3) 25 MCG (1000 UT) CAPS Take 2 capsules (2,000 Units total) by mouth daily. 08/29/21   [name], MD  Coenzyme Q10 100 MG capsule Take by mouth.    [provider]  cycloSPORINE (RESTASIS) 0.05 % ophthalmic emulsion Restasis 0.05 % eye drops in a dropperette  INSTILL 1 DROP INTO BOTH EYES TWICE A DAY    [provider]  donepezil (ARICEPT) 10 MG tablet Take 10 mg by mouth every morning. 05/09/21   [provider]  ezetimibe (ZETIA) 10 MG tablet TAKE 1 TABLET BY MOUTH EVERY DAY 10/25/21   Minna Merritts, MD  levothyroxine (SYNTHROID) 100 MCG tablet TAKE 1 TABLET BY MOUTH EVERY DAY BEFORE BREAKFAST 09/20/21   [name], MD  magnesium oxide (MAG-OX) 400 MG tablet Take 400 mg by  mouth daily.    [provider]  neomycin-polymyxin-hydrocortisone (CORTISPORIN) 3.5-10000-1 OTIC suspension Place 4 drops into the left ear 3 (three) times daily. 10/23/21   Sharion Balloon, NP  OVER THE COUNTER MEDICATION Take by mouth daily. Hemp oil    [provider]  Probiotic Product (PROBIOTIC + TURMERIC EXTRACT) 400 MG CAPS Take by mouth.    [provider]  Probiotic Product (PROBIOTIC ACIDOPHILUS) CAPS Take 1 capsule by mouth daily.    [provider]  rosuvastatin (CRESTOR) 10 MG tablet Take 1 tablet (10 mg total) by mouth daily. 03/15/21   [name], MD  sertraline (ZOLOFT) 50 MG tablet TAKE 1 TABLET (50 MG TOTAL) BY MOUTH DAILY. WITH A MEAL. Patient not taking: Reported on 09/30/2021 12/29/20   [name], MD  testosterone cypionate (DEPOTESTOSTERONE CYPIONATE) 200 MG/ML injection INJECT 0.5 ML (100 MG TOTAL) INTO THE MUSCLE EVERY 14 DAYS. 11/13/21   [name], MD  Turmeric 400 MG CAPS Take by mouth.    [provider]  vitamin B-12 (CYANOCOBALAMIN) 500 MCG tablet Take 1 tablet by mouth daily.    [provider]    Family History Family History  Problem Relation Age of Onset    Hypertension Mother    Heart disease Mother        CHF   Heart disease Father    Cancer Sister        melanoma    Social History Social History   Tobacco Use   Smoking status: Never   Smokeless tobacco: Never  Vaping Use   Vaping Use: Never used  Substance Use Topics   Alcohol use: Yes    Comment: 1 glass of wine daily   Drug use: No     Allergies   Penicillins and Lexapro [escitalopram]   Review of Systems Review of Systems  Constitutional:  Negative for chills and fever.  Respiratory:  Negative for cough and shortness of breath.   Cardiovascular:  Negative for chest pain and palpitations.  Musculoskeletal:  Positive for arthralgias, gait problem and joint swelling.  Skin:  Positive for color change and wound.  All other systems reviewed and are negative.    Physical Exam Triage Vital Signs ED Triage Vitals  Enc Vitals Group     BP 11/28/21 1015 109/67     Pulse Rate 11/28/21 1015 97     Resp 11/28/21 1015 18     Temp 11/28/21 1015 98 F (36.7 C)     Temp src --      SpO2 11/28/21 1015 97 %     Weight 11/28/21 1018 176 lb 5.9 oz (80 kg)     Height 11/28/21 1018 '6\' 2"'$  (1.88 m)     Head Circumference --      Peak Flow --      Pain Score 11/28/21 1015 8     Pain Loc --      Pain Edu? --      Excl. in [place]? --    No data found.  Updated Vital Signs BP 109/67   Pulse 97   Temp 98 F (36.7 C)   Resp 18   Ht '6\' 2"'$  (1.88 m)   Wt 176 lb 5.9 oz (80 kg)   SpO2 97%   BMI 22.64 kg/m   Visual Acuity Right Eye Distance:   Left Eye Distance:   Bilateral Distance:    Right Eye Near:   Left Eye Near:  Bilateral Near:     Physical Exam Vitals and nursing note reviewed.  Constitutional:      General: He is not in acute distress.    Appearance: He is well-developed. He is ill-appearing.  HENT:     Mouth/Throat:     Mouth: Mucous membranes are moist.  Cardiovascular:     Rate and Rhythm: Normal rate and regular rhythm.     Heart sounds: Normal  heart sounds.  Pulmonary:     Effort: Pulmonary effort is normal. No respiratory distress.     Breath sounds: Normal breath sounds.  Musculoskeletal:        General: Swelling and tenderness present.     Cervical back: Neck supple.  Skin:    General: Skin is warm and dry.     Capillary Refill: Capillary refill takes less than 2 seconds.     Findings: Erythema and lesion present.     Comments: Right lower leg tender to touch, edematous, erythematous around laceration. See pictures.   Neurological:     General: No focal deficit present.     Mental Status: He is alert.     Sensory: No sensory deficit.     Motor: No weakness.     Gait: Gait abnormal.  Psychiatric:        Mood and Affect: Mood normal.        Behavior: Behavior normal.           UC Treatments / Results  Labs (all labs ordered are listed, but only abnormal results are displayed) Labs Reviewed - No data to display  EKG   Radiology No results found.  Procedures Procedures (including critical care time)  Medications Ordered in UC Medications - No data to display  Initial Impression / Assessment and Plan / UC Course  I have reviewed the triage vital signs and the nursing notes.  Pertinent labs & imaging results that were available during my care of the patient were reviewed by me and considered in my medical decision making (see chart for details).    Infected wound, cellulitis of right lower leg, laceration of right lower leg.  Right lower leg brightly erythematous around wound, edematous, acutely tender to touch.  I discussed with the patient and his wife the limitations of evaluation in urgent care setting.  I discussed that he may need stat lab work and IV antibiotics.  Sending him to the ED for evaluation.  He and his wife are agreeable to this.  His wife will drive him to the ED.  Final Clinical Impressions(s) / UC Diagnoses   Final diagnoses:  Cellulitis of right lower leg  Laceration of right  lower leg, subsequent encounter  Infected wound     Discharge Instructions      Go to the emergency department for evaluation of the infected wound on your right lower leg.         ED Prescriptions   None    PDMP not reviewed this encounter.   Sharion Balloon, NP 11/28/21 1048

## 2021-11-28 NOTE — ED Triage Notes (Addendum)
Pt comes with c/o cellulitis to right lower leg. Pt had leg injury few days ago and had stitches placed. Bandage in place at this time.

## 2021-11-28 NOTE — ED Triage Notes (Signed)
Patient to Urgent Care with complaints of a possible wound infection present to right shin. Temperature 99.5 on Sunday. Evaluated by EMT last night. Redness and swelling surrounds wound, no purulent drainage. Reports constant pain.   Patient had 13 stitches placed on 9/15 after injuring himself on a piece of plywood in his attic.

## 2021-11-28 NOTE — ED Provider Notes (Signed)
[facility] Provider Note    Event Date/Time   First MD Initiated Contact with Patient 11/28/21 1251     (approximate)   History   cellulitis   HPI  Theodore [name] is a 88 y.o. male who presents to the emergency department today because of concerns for redness and pain to the right lower extremity.  The patient went to urgent care 3 days ago after suffering a laceration to the right lower leg.  He states that he scraped it against some dirty plywood.  At urgent care he had it sutured closed.  The following day noticed some redness to that area.  It has gotten worse.  Patient does have pain to that area.  Patient has not had any fevers but has had some chills.     Physical Exam   Triage Vital Signs: ED Triage Vitals  Enc Vitals Group     BP 11/28/21 1156 129/64     Pulse Rate 11/28/21 1156 90     Resp 11/28/21 1156 16     Temp 11/28/21 1156 98.5 F (36.9 C)     Temp Source 11/28/21 1156 Oral     SpO2 11/28/21 1156 96 %     Weight --      Height --      Head Circumference --      Peak Flow --      Pain Score 11/28/21 1105 4     Pain Loc --      Pain Edu? --      Excl. in [place]? --     Most recent vital signs: Vitals:   11/28/21 1156  BP: 129/64  Pulse: 90  Resp: 16  Temp: 98.5 F (36.9 C)  SpO2: 96%    General: Awake, alert, oriented. CV:  Good peripheral perfusion. Regular rate and rhythm Resp:  Normal effort. Lungs clear. Abd:  No distention.  Other:  Redness and warmth to right lower extremity.    ED Results / Procedures / Treatments   Labs (all labs ordered are listed, but only abnormal results are displayed) Labs Reviewed  CBC - Abnormal; Notable for the following components:      Result Value   WBC 21.6 (*)    All other components within normal limits  BASIC METABOLIC PANEL - Abnormal; Notable for the following components:   Glucose, Bld 124 (*)    BUN 35 (*)    Creatinine, Ser 1.72 (*)    GFR, Estimated 37 (*)    All  other components within normal limits  LACTIC ACID, PLASMA - Abnormal; Notable for the following components:   Lactic Acid, Venous 2.1 (*)    All other components within normal limits  LACTIC ACID, PLASMA     EKG  None   RADIOLOGY I independently interpreted and visualized the right tib/fib. My interpretation: No acute osseous abnormality Radiology interpretation:  IMPRESSION:  1. Status post total right knee arthroplasty. No evidence of  hardware failure.  2. No cortical erosion to indicate radiographic evidence of acute  osteomyelitis.    PROCEDURES:  Critical Care performed: No  Procedures   MEDICATIONS ORDERED IN ED: Medications - No data to display   IMPRESSION / MDM / [place] / ED COURSE  I reviewed the triage vital signs and the nursing notes.  Differential diagnosis includes, but is not limited to, infection, localized reaction  Patient's presentation is most consistent with acute presentation with potential threat to life or bodily function.  Patient presented to the emergency department today because of concerns for lower extremity infection.  On exam he does have erythema and warmth to the right lower extremity.  Blood work with positive leukocytosis and lactic acidosis.  At this time I do think patient has infection. X-ray was performed which did not show any osseous involvement or FB. Will start IV and vancomycin.  Discussed with Dr. Sidney Ace with the hospital service will plan on admission.  FINAL CLINICAL IMPRESSION(S) / ED DIAGNOSES   Final diagnoses:  Cellulitis, unspecified cellulitis site    Note:  This document was prepared using Dragon voice recognition software and may include unintentional dictation errors.    Nance Pear, MD 11/28/21 [phone]

## 2021-11-28 NOTE — ED Notes (Signed)
MD Archie Balboa notified of pts lactic acid level. First set of blood cultures drawn and sent to lab.

## 2021-11-28 NOTE — Assessment & Plan Note (Signed)
-   We will continue PPI therapy 

## 2021-11-28 NOTE — Consult Note (Signed)
Date of Consultation:  11/28/2021  Requesting Physician:  Eugenie Norrie, MD  Reason for Consultation:  Right lower extremity wound  History of Present Illness: Theodore [name] is a 88 y.o. male admitted to the hospital today due to worsening right lower extremity infection.  The patient had a fall on 11/24/21 and suffered a laceration to his right lower extremity, shin area.  He was seen at Urgent Care the next day and had 13 interrupted sutures placed to close the laceration.  He reports that since then he had been getting worsening swelling and tenderness as well as redness of the laceration site.  He presented to the ED and was found to have chills.  Vitals were otherwise normal without any fever and normal BP.  WBC on admission was 21.6, had lactic acid of 2.1, AKI with Cr of 1.72.  XR of the lower leg did not reveal any gas or foreign body.  He has been started on broad spectrum antibiotics with Cefepime and Vancomycin.  Past Medical History: Past Medical History:  Diagnosis Date   Actinic keratosis    Arthritis    knees   Asymptomatic Sinus bradycardia    a. 05/2016 Zio: Avg HR 61 (41-167).   Benign essential tremor    Cancer ([place]) 2002   melanoma right ear   Colon polyp    COPD (chronic obstructive pulmonary disease) ([place])    pt said he believes it is a misdiagnosis    Coronary artery calcification seen on CT scan    a. 06/2016 CTA Chest: cor Ca2+; b. 05/2017 MV: EF >65%. No ischemia/infarct.   Depression    History of echocardiogram    a. 04/2016 Echo: EF 60-65%, mild conc LVH. Nl PASP.   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 05/2016 Zio: Avg HR 61 (41-167). 9 SVT runs (fastest 167 - 5 beats; longest 8 beats - 101  bpm). Rare PACs/PVCs.   PVC's (premature ventricular contractions)    Rhinitis    Wears dentures    partial upper and lower   Wears hearing aid in both ears      Past Surgical History: Past Surgical History:  Procedure Laterality Date   ADENOIDECTOMY     CATARACT EXTRACTION W/PHACO Left 10/22/2017   Procedure: CATARACT EXTRACTION PHACO AND INTRAOCULAR LENS PLACEMENT (IOC)  LEFT;  Surgeon: Eulogio Bear, MD;  Location: [facility];  Service: Ophthalmology;  Laterality: Left;   CATARACT EXTRACTION W/PHACO Right 11/20/2017   Procedure: CATARACT EXTRACTION PHACO AND INTRAOCULAR LENS PLACEMENT (IOC) RIGHT;  Surgeon: Eulogio Bear, MD;  Location: [facility];  Service: Ophthalmology;  Laterality: Right;   ESOPHAGOGASTRODUODENOSCOPY (EGD) WITH PROPOFOL N/A 10/31/2016   Procedure: ESOPHAGOGASTRODUODENOSCOPY (EGD) WITH PROPOFOL;  Surgeon: Lollie Sails, MD;  Location[facility]COPY;  Service: Endoscopy;  Laterality: N/A;   JOINT REPLACEMENT Right 2003   knee   knee meniscus repair Right    MELANOMA EXCISION Right    ear. Followed by Dr. Nehemiah Massed   PATELLECTOMY Bilateral    TONSILLECTOMY      Home Medications: Prior to Admission medications   Medication Sig Start Date  End Date Taking? Authorizing Provider  Alpha-Lipoic Acid 600 MG CAPS Take by mouth.   Yes [provider]  ascorbic Acid (VITAMIN C) 500 MG CPCR Take by mouth.   Yes [provider]  buPROPion (WELLBUTRIN XL) 150 MG 24 hr tablet TAKE 1 TABLET BY MOUTH EVERY DAY 05/25/21  Yes Dutch Quint B, FNP  Cholecalciferol (VITAMIN D3) 25 MCG (1000 UT) CAPS Take 2 capsules (2,000 Units total) by mouth daily. 08/29/21  Yes [name], MD  Coenzyme Q10 100 MG capsule Take by mouth.   Yes [provider]  cycloSPORINE (RESTASIS) 0.05 % ophthalmic emulsion Restasis 0.05 % eye drops in a dropperette  INSTILL 1 DROP INTO BOTH EYES TWICE A DAY   Yes [provider]  donepezil  (ARICEPT) 10 MG tablet Take 10 mg by mouth every morning. 05/09/21  Yes [provider]  ezetimibe (ZETIA) 10 MG tablet TAKE 1 TABLET BY MOUTH EVERY DAY 10/25/21  Yes Gollan, Kathlene November, MD  levothyroxine (SYNTHROID) 100 MCG tablet TAKE 1 TABLET BY MOUTH EVERY DAY BEFORE BREAKFAST 09/20/21  Yes [name], MD  magnesium oxide (MAG-OX) 400 MG tablet Take 400 mg by mouth daily.   Yes [provider]  neomycin-polymyxin-hydrocortisone (CORTISPORIN) 3.5-10000-1 OTIC suspension Place 4 drops into the left ear 3 (three) times daily. 10/23/21  Yes Sharion Balloon, NP  omeprazole (PRILOSEC) 20 MG capsule Take 1 capsule by mouth at bedtime. 11/22/21  Yes [provider]  OVER THE COUNTER MEDICATION Take by mouth daily. Hemp oil   Yes [provider]  Probiotic Product (PROBIOTIC + TURMERIC EXTRACT) 400 MG CAPS Take by mouth.   Yes [provider]  rosuvastatin (CRESTOR) 10 MG tablet Take 1 tablet (10 mg total) by mouth daily. 03/15/21  Yes [name], MD  traMADol (ULTRAM) 50 MG tablet Take 50 mg by mouth every 6 (six) hours as needed.   Yes [provider]  Turmeric 400 MG CAPS Take by mouth.   Yes [provider]  vitamin B-12 (CYANOCOBALAMIN) 500 MCG tablet Take 1 tablet by mouth daily.   Yes [provider]  Probiotic Product (PROBIOTIC ACIDOPHILUS) CAPS Take 1 capsule by mouth daily.    [provider]  sertraline (ZOLOFT) 50 MG tablet TAKE 1 TABLET (50 MG TOTAL) BY MOUTH DAILY. WITH A MEAL. Patient not taking: Reported on 09/30/2021 12/29/20   [name], MD  testosterone cypionate (DEPOTESTOSTERONE CYPIONATE) 200 MG/ML injection INJECT 0.5 ML (100 MG TOTAL) INTO THE MUSCLE EVERY 14 DAYS. 11/13/21   [name], MD    Allergies: Allergies  Allergen Reactions   Penicillins Rash and Other (See Comments)    Other reaction(s): Other (see comments) Other reaction(s): UNKNOWN   Lexapro [Escitalopram] Other (See  Comments)    Increased hand tremor    Social History:  reports that he has never smoked. He has never used smokeless tobacco. He reports current alcohol use. He reports that he does not use drugs.   Family History: Family History  Problem Relation Age of Onset   Hypertension Mother    Heart disease Mother        CHF   Heart disease Father    Cancer Sister        melanoma    Review of Systems: Review of Systems  Constitutional:  Positive for chills. Negative for fever.  HENT:  Negative for hearing loss.   Respiratory:  Negative for shortness of breath.  Cardiovascular:  Negative for chest pain.  Gastrointestinal:  Negative for abdominal pain, nausea and vomiting.  Genitourinary:  Negative for dysuria.  Musculoskeletal:  Negative for myalgias.  Skin:        Swelling, redness, tenderness in right lower leg  Neurological:  Negative for dizziness.  Psychiatric/Behavioral:  Negative for depression.     Physical Exam BP (!) 150/56   Pulse 86   Temp 97.8 F (36.6 C)   Resp 18   SpO2 98%  CONSTITUTIONAL: No acute distress HEENT:  Normocephalic, atraumatic, extraocular motion intact. NECK: Trachea is midline, and there is no jugular venous distension. RESPIRATORY:  Normal respiratory effort without pathologic use of accessory muscles. CARDIOVASCULAR: Regular rhythm and rate. MUSCULOSKELETAL:  Normal muscle strength and tone in all four extremities.  No peripheral edema or cyanosis. SKIN: The right lower leg has an anterior laceration which sutures closing the wound.  There is surrounding induration and erythema, with fluctuance around the repair.  This is very tender to palpation and the patient could not tolerated pushing on the area to evaluate it better.  No active drainage at this point.  NEUROLOGIC:  Motor and sensation is grossly normal.  Cranial nerves are grossly intact. PSYCH:  Alert and oriented to person, place and time. Affect is normal.  Laboratory  Analysis: Results for orders placed or performed during the hospital encounter of 11/28/21 (from the past 24 hour(s))  CBC     Status: Abnormal   Collection Time: 11/28/21 12:04 PM  Result Value Ref Range   WBC 21.6 (H) 4.0 - 10.5 K/uL   RBC 4.72 4.22 - 5.81 MIL/uL   Hemoglobin 15.1 13.0 - 17.0 g/dL   HCT 45.3 39.0 - 52.0 %   MCV 96.0 80.0 - 100.0 fL   MCH 32.0 26.0 - 34.0 pg   MCHC 33.3 30.0 - 36.0 g/dL   RDW 13.3 11.5 - 15.5 %   Platelets 251 150 - 400 K/uL   nRBC 0.0 0.0 - 0.2 %  Basic metabolic panel     Status: Abnormal   Collection Time: 11/28/21 12:04 PM  Result Value Ref Range   Sodium 137 135 - 145 mmol/L   Potassium 4.6 3.5 - 5.1 mmol/L   Chloride 102 98 - 111 mmol/L   CO2 24 22 - 32 mmol/L   Glucose, Bld 124 (H) 70 - 99 mg/dL   BUN 35 (H) 8 - 23 mg/dL   Creatinine, Ser 1.72 (H) 0.61 - 1.24 mg/dL   Calcium 9.1 8.9 - 10.3 mg/dL   GFR, Estimated 37 (L) >60 mL/min   Anion gap 11 5 - 15  Lactic acid, plasma     Status: Abnormal   Collection Time: 11/28/21 12:04 PM  Result Value Ref Range   Lactic Acid, Venous 2.1 (HH) 0.5 - 1.9 mmol/L  Blood culture (routine x 2)     Status: None (Preliminary result)   Collection Time: 11/28/21 12:49 PM   Specimen: BLOOD  Result Value Ref Range   Specimen Description BLOOD BLOOD RIGHT FOREARM    Special Requests      BOTTLES DRAWN AEROBIC AND ANAEROBIC Blood Culture adequate volume   Culture      NO GROWTH <12 HOURS Performed at [facility], [address] [place], [place] [address]    Report Status PENDING   Blood culture (routine x 2)     Status: None (Preliminary result)   Collection Time: 11/28/21  1:10 PM   Specimen: BLOOD  Result  Value Ref Range   Specimen Description BLOOD BLOOD RIGHT FOREARM    Special Requests      BOTTLES DRAWN AEROBIC AND ANAEROBIC Blood Culture results may not be optimal due to an inadequate volume of blood received in culture bottles   Culture      NO GROWTH < 12 HOURS Performed at  [facility], [address] [place], [place] [address]    Report Status PENDING   Lactic acid, plasma     Status: Abnormal   Collection Time: 11/28/21  7:30 PM  Result Value Ref Range   Lactic Acid, Venous 2.7 (HH) 0.5 - 1.9 mmol/L    Imaging: DG Tibia/Fibula Right  Result Date: 11/28/2021 CLINICAL DATA:  Cellulitis right lower leg. Infection. Leg injury a few days ago and had stitches placed. Bandage in place at this time. EXAM: RIGHT TIBIA AND FIBULA - 2 VIEW COMPARISON:  None Available. FINDINGS: Postsurgical changes of total right knee arthroplasty. The superior aspect of the distal femoral hardware is seen on lateral view only. No perihardware lucency is seen to indicate hardware failure or loosening. There are 2 small chronic ossicles at the superior aspect of the tibial tubercle. No overlying anterior soft tissue swelling. Mild distal medial and lateral malleolar degenerative osteophytes at the ankle. No cortical erosion. No acute fracture is seen. No dislocation. Mild vascular calcifications. IMPRESSION: 1. Status post total right knee arthroplasty. No evidence of hardware failure. 2. No cortical erosion to indicate radiographic evidence of acute osteomyelitis. Electronically Signed   By: Yvonne Kendall M.D.   On: 11/28/2021 13:41    Assessment and Plan: This is a 88 y.o. male with an infected right lower leg wound  --Discussed with the patient the options for taking some sutures out at bedside to open the wound and let it drain to help with the infection.  However, the patient could not tolerate even my exam when trying to palpate the area.  He prefers to do this under anesthesia.  Discussed with him that we could proceed with I&D of the wound tomorrow in the OR, so he can be under anesthesia and we can be more thorough to drain and irrigate the wound.  Discussed that likely would need gauze packing dressing changes.  We'd obtain cultures.  The patient is in agreement. --Will  make him NPO after midnight in preparation for surgery.  Continue IV abx and resuscitation.  I spent 60 minutes dedicated to the care of this patient on the date of this encounter to include pre-visit review of records, face-to-face time with the patient discussing diagnosis and management, and any post-visit coordination of care.   Melvyn Neth, MD [facility] Pg:  [phone]

## 2021-11-29 ENCOUNTER — Inpatient Hospital Stay: Payer: PPO | Admitting: Anesthesiology

## 2021-11-29 ENCOUNTER — Encounter: Payer: PPO | Admitting: Speech Pathology

## 2021-11-29 ENCOUNTER — Other Ambulatory Visit: Payer: Self-pay

## 2021-11-29 ENCOUNTER — Encounter
Admission: EM | Disposition: A | Discharge status: Skilled Nursing Facility | Payer: Self-pay | Source: Home / Self Care | Attending: Internal Medicine

## 2021-11-29 ENCOUNTER — Encounter: Payer: Self-pay | Admitting: Family Medicine

## 2021-11-29 DIAGNOSIS — L089 Local infection of the skin and subcutaneous tissue, unspecified: Secondary | ICD-10-CM | POA: Diagnosis not present

## 2021-11-29 DIAGNOSIS — S81811A Laceration without foreign body, right lower leg, initial encounter: Secondary | ICD-10-CM | POA: Diagnosis not present

## 2021-11-29 DIAGNOSIS — A419 Sepsis, unspecified organism: Secondary | ICD-10-CM | POA: Diagnosis not present

## 2021-11-29 DIAGNOSIS — L0390 Cellulitis, unspecified: Secondary | ICD-10-CM | POA: Diagnosis not present

## 2021-11-29 DIAGNOSIS — L03115 Cellulitis of right lower limb: Secondary | ICD-10-CM | POA: Diagnosis not present

## 2021-11-29 DIAGNOSIS — T8149XA Infection following a procedure, other surgical site, initial encounter: Secondary | ICD-10-CM | POA: Diagnosis not present

## 2021-11-29 DIAGNOSIS — E440 Moderate protein-calorie malnutrition: Secondary | ICD-10-CM | POA: Insufficient documentation

## 2021-11-29 HISTORY — PX: I & D EXTREMITY: SHX5045

## 2021-11-29 LAB — BASIC METABOLIC PANEL
Anion gap: 8 (ref 5–15)
BUN: 28 mg/dL — ABNORMAL HIGH (ref 8–23)
CO2: 25 mmol/L (ref 22–32)
Calcium: 8.2 mg/dL — ABNORMAL LOW (ref 8.9–10.3)
Chloride: 104 mmol/L (ref 98–111)
Creatinine, Ser: 1.22 mg/dL (ref 0.61–1.24)
GFR, Estimated: 56 mL/min — ABNORMAL LOW (ref >60)
Glucose, Bld: 89 mg/dL (ref 70–99)
Potassium: 3.8 mmol/L (ref 3.5–5.1)
Sodium: 137 mmol/L (ref 135–145)

## 2021-11-29 LAB — PROCALCITONIN: Procalcitonin: 0.38 ng/mL

## 2021-11-29 LAB — CBC
HCT: 39 % (ref 39.0–52.0)
Hemoglobin: 12.9 g/dL — ABNORMAL LOW (ref 13.0–17.0)
MCH: 31.5 pg (ref 26.0–34.0)
MCHC: 33.1 g/dL (ref 30.0–36.0)
MCV: 95.4 fL (ref 80.0–100.0)
Platelets: 185 10*3/uL (ref 150–400)
RBC: 4.09 MIL/uL — ABNORMAL LOW (ref 4.22–5.81)
RDW: 13.1 % (ref 11.5–15.5)
WBC: 11.1 10*3/uL — ABNORMAL HIGH (ref 4.0–10.5)
nRBC: 0 % (ref 0.0–0.2)

## 2021-11-29 LAB — PROTIME-INR
INR: 1.4 — ABNORMAL HIGH (ref 0.8–1.2)
Prothrombin Time: 16.8 seconds — ABNORMAL HIGH (ref 11.4–15.2)

## 2021-11-29 LAB — LACTIC ACID, PLASMA: Lactic Acid, Venous: 0.7 mmol/L (ref 0.5–1.9)

## 2021-11-29 LAB — CORTISOL-AM, BLOOD: Cortisol - AM: 19.2 ug/dL (ref 6.7–22.6)

## 2021-11-29 SURGERY — IRRIGATION AND DEBRIDEMENT EXTREMITY
Anesthesia: General | Site: Leg Lower | Laterality: Right

## 2021-11-29 MED ORDER — PHENYLEPHRINE 80 MCG/ML (10ML) SYRINGE FOR IV PUSH (FOR BLOOD PRESSURE SUPPORT)
PREFILLED_SYRINGE | INTRAVENOUS | Status: DC | PRN
  Administered 2021-11-29 (×5): 80 ug via INTRAVENOUS

## 2021-11-29 MED ORDER — 0.9 % SODIUM CHLORIDE (POUR BTL) OPTIME
TOPICAL | Status: DC | PRN
  Administered 2021-11-29: 1000 mL

## 2021-11-29 MED ORDER — IPRATROPIUM-ALBUTEROL 0.5-2.5 (3) MG/3ML IN SOLN: 3.0000 mL | RESPIRATORY_TRACT | Status: DC | PRN

## 2021-11-29 MED ORDER — OXYCODONE HCL 5 MG PO TABS
5.0000 mg | ORAL_TABLET | ORAL | Status: DC | PRN
  Administered 2021-11-29 – 2021-12-02 (×9): 5 mg via ORAL
  Filled 2021-11-29 (×9): qty 1

## 2021-11-29 MED ORDER — SENNOSIDES-DOCUSATE SODIUM 8.6-50 MG PO TABS
1.0000 | ORAL_TABLET | Freq: Every evening | ORAL | Status: DC | PRN
  Filled 2021-11-29: qty 1

## 2021-11-29 MED ORDER — FENTANYL CITRATE (PF) 100 MCG/2ML IJ SOLN: 25.0000 ug | INTRAMUSCULAR | Status: DC | PRN

## 2021-11-29 MED ORDER — CEFAZOLIN SODIUM-DEXTROSE 2-3 GM-%(50ML) IV SOLR
INTRAVENOUS | Status: DC | PRN
  Administered 2021-11-29: 2 g via INTRAVENOUS

## 2021-11-29 MED ORDER — ACETAMINOPHEN 10 MG/ML IV SOLN: 1000.0000 mg | Freq: Once | INTRAVENOUS | Status: DC | PRN

## 2021-11-29 MED ORDER — GUAIFENESIN 100 MG/5ML PO LIQD: 5.0000 mL | ORAL | Status: DC | PRN

## 2021-11-29 MED ORDER — OXYCODONE HCL 5 MG PO TABS: 5.0000 mg | ORAL_TABLET | Freq: Once | ORAL | Status: DC | PRN

## 2021-11-29 MED ORDER — PROPOFOL 500 MG/50ML IV EMUL
INTRAVENOUS | Status: DC | PRN
  Administered 2021-11-29: 100 ug/kg/min via INTRAVENOUS

## 2021-11-29 MED ORDER — BUPIVACAINE-EPINEPHRINE (PF) 0.5% -1:200000 IJ SOLN
INTRAMUSCULAR | Status: AC
  Filled 2021-11-29: qty 30

## 2021-11-29 MED ORDER — ONDANSETRON HCL 4 MG/2ML IJ SOLN: 4.0000 mg | Freq: Once | INTRAMUSCULAR | Status: DC | PRN

## 2021-11-29 MED ORDER — MORPHINE SULFATE (PF) 2 MG/ML IV SOLN
2.0000 mg | INTRAVENOUS | Status: DC | PRN
  Administered 2021-11-30 – 2021-12-07 (×14): 2 mg via INTRAVENOUS
  Filled 2021-11-29 (×15): qty 1

## 2021-11-29 MED ORDER — ACETAMINOPHEN 10 MG/ML IV SOLN
INTRAVENOUS | Status: DC | PRN
  Administered 2021-11-29: 1000 mg via INTRAVENOUS

## 2021-11-29 MED ORDER — TRAZODONE HCL 50 MG PO TABS
50.0000 mg | ORAL_TABLET | Freq: Every evening | ORAL | Status: DC | PRN
  Administered 2021-11-30 – 2021-12-06 (×7): 50 mg via ORAL
  Filled 2021-11-29 (×8): qty 1

## 2021-11-29 MED ORDER — PROPOFOL 10 MG/ML IV BOLUS
INTRAVENOUS | Status: AC
  Filled 2021-11-29: qty 20

## 2021-11-29 MED ORDER — KETOROLAC TROMETHAMINE 30 MG/ML IJ SOLN
15.0000 mg | Freq: Four times a day (QID) | INTRAMUSCULAR | Status: AC | PRN
  Administered 2021-12-01 – 2021-12-02 (×3): 15 mg via INTRAVENOUS
  Filled 2021-11-29 (×3): qty 1

## 2021-11-29 MED ORDER — ENSURE ENLIVE PO LIQD
237.0000 mL | Freq: Two times a day (BID) | ORAL | Status: DC
  Administered 2021-11-30 – 2021-12-07 (×10): 237 mL via ORAL

## 2021-11-29 MED ORDER — VITAMIN C 500 MG PO TABS
500.0000 mg | ORAL_TABLET | Freq: Two times a day (BID) | ORAL | Status: DC
  Administered 2021-11-29 – 2021-12-07 (×15): 500 mg via ORAL
  Filled 2021-11-29 (×16): qty 1

## 2021-11-29 MED ORDER — ZINC SULFATE 220 (50 ZN) MG PO CAPS
220.0000 mg | ORAL_CAPSULE | Freq: Every day | ORAL | Status: DC
  Administered 2021-11-30 – 2021-12-07 (×6): 220 mg via ORAL
  Filled 2021-11-29 (×7): qty 1

## 2021-11-29 MED ORDER — FENTANYL CITRATE (PF) 100 MCG/2ML IJ SOLN
INTRAMUSCULAR | Status: AC
  Filled 2021-11-29: qty 2

## 2021-11-29 MED ORDER — BUPIVACAINE-EPINEPHRINE (PF) 0.5% -1:200000 IJ SOLN
INTRAMUSCULAR | Status: DC | PRN
  Administered 2021-11-29: 40 mL via INTRAMUSCULAR

## 2021-11-29 MED ORDER — ADULT MULTIVITAMIN W/MINERALS CH
1.0000 | ORAL_TABLET | Freq: Every day | ORAL | Status: DC
  Administered 2021-11-30 – 2021-12-07 (×6): 1 via ORAL
  Filled 2021-11-29 (×7): qty 1

## 2021-11-29 MED ORDER — VANCOMYCIN HCL 1250 MG/250ML IV SOLN
1250.0000 mg | INTRAVENOUS | Status: DC
  Administered 2021-11-30 (×2): 1250 mg via INTRAVENOUS
  Filled 2021-11-29 (×2): qty 250

## 2021-11-29 MED ORDER — ONDANSETRON HCL 4 MG/2ML IJ SOLN
INTRAMUSCULAR | Status: DC | PRN
  Administered 2021-11-29: 4 mg via INTRAVENOUS

## 2021-11-29 MED ORDER — OXYCODONE HCL 5 MG/5ML PO SOLN: 5.0000 mg | Freq: Once | ORAL | Status: DC | PRN

## 2021-11-29 MED ORDER — METOPROLOL TARTRATE 5 MG/5ML IV SOLN: 5.0000 mg | INTRAVENOUS | Status: DC | PRN

## 2021-11-29 MED ORDER — SODIUM CHLORIDE 0.9 % IV SOLN: INTRAVENOUS | Status: DC

## 2021-11-29 MED ORDER — FENTANYL CITRATE (PF) 100 MCG/2ML IJ SOLN
INTRAMUSCULAR | Status: DC | PRN
  Administered 2021-11-29 (×2): 25 ug via INTRAVENOUS
  Administered 2021-11-29: 50 ug via INTRAVENOUS

## 2021-11-29 MED ORDER — HYDRALAZINE HCL 20 MG/ML IJ SOLN: 10.0000 mg | INTRAMUSCULAR | Status: DC | PRN

## 2021-11-29 SURGICAL SUPPLY — 41 items
APL PRP STRL LF DISP 70% ISPRP (MISCELLANEOUS)
BNDG GAUZE DERMACEA FLUFF 4 (GAUZE/BANDAGES/DRESSINGS) ×1 IMPLANT
BNDG GZE DERMACEA 4 6PLY (GAUZE/BANDAGES/DRESSINGS) ×1
CHLORAPREP W/TINT 26 (MISCELLANEOUS) ×1 IMPLANT
CNTNR SPEC 2.5X3XGRAD LEK (MISCELLANEOUS)
CONT SPEC 4OZ STER OR WHT (MISCELLANEOUS)
CONT SPEC 4OZ STRL OR WHT (MISCELLANEOUS)
CONTAINER SPEC 2.5X3XGRAD LEK (MISCELLANEOUS) ×1 IMPLANT
DRAIN PENROSE 12X.25 LTX STRL (MISCELLANEOUS) ×1 IMPLANT
DRAPE LAPAROTOMY 77X122 PED (DRAPES) ×1 IMPLANT
DRSG GAUZE FLUFF 36X18 (GAUZE/BANDAGES/DRESSINGS) ×1 IMPLANT
ELECT CAUTERY BLADE TIP 2.5 (TIP) ×1
ELECT REM PT RETURN 9FT ADLT (ELECTROSURGICAL) ×1
ELECTRODE CAUTERY BLDE TIP 2.5 (TIP) ×1 IMPLANT
ELECTRODE REM PT RTRN 9FT ADLT (ELECTROSURGICAL) ×1 IMPLANT
GAUZE 4X4 16PLY ~~LOC~~+RFID DBL (SPONGE) ×1 IMPLANT
GAUZE SPONGE 4X4 12PLY STRL (GAUZE/BANDAGES/DRESSINGS) ×1 IMPLANT
GLOVE SURG SYN 7.0 (GLOVE) ×1 IMPLANT
GLOVE SURG SYN 7.0 PF PI (GLOVE) ×1 IMPLANT
GLOVE SURG SYN 7.5  E (GLOVE) ×1
GLOVE SURG SYN 7.5 E (GLOVE) ×1 IMPLANT
GLOVE SURG SYN 7.5 PF PI (GLOVE) ×1 IMPLANT
GOWN STRL REUS W/ TWL LRG LVL3 (GOWN DISPOSABLE) ×2 IMPLANT
GOWN STRL REUS W/TWL LRG LVL3 (GOWN DISPOSABLE) ×2
KIT TURNOVER KIT A (KITS) ×1 IMPLANT
LABEL OR SOLS (LABEL) ×1 IMPLANT
MANIFOLD NEPTUNE II (INSTRUMENTS) ×1 IMPLANT
NEEDLE HYPO 22GX1.5 SAFETY (NEEDLE) ×1 IMPLANT
NS IRRIG 500ML POUR BTL (IV SOLUTION) ×1 IMPLANT
PACK BASIN MINOR ARMC (MISCELLANEOUS) ×1 IMPLANT
PAD ABD DERMACEA PRESS 5X9 (GAUZE/BANDAGES/DRESSINGS) ×1 IMPLANT
SOL PREP PVP 2OZ (MISCELLANEOUS) ×1
SOLUTION PREP PVP 2OZ (MISCELLANEOUS) ×1 IMPLANT
SPONGE T-LAP 18X18 ~~LOC~~+RFID (SPONGE) ×2 IMPLANT
SUT SILK 2 0 (SUTURE) ×1
SUT SILK 2-0 18XBRD TIE 12 (SUTURE) IMPLANT
SWAB CULTURE AMIES ANAERIB BLU (MISCELLANEOUS) ×2 IMPLANT
SYR 20ML LL LF (SYRINGE) ×1 IMPLANT
SYR BULB IRRIG 60ML STRL (SYRINGE) ×1 IMPLANT
TRAP FLUID SMOKE EVACUATOR (MISCELLANEOUS) ×1 IMPLANT
WATER STERILE IRR 500ML POUR (IV SOLUTION) ×1 IMPLANT

## 2021-11-29 NOTE — Anesthesia Procedure Notes (Signed)
Procedure Name: MAC Date/Time: 11/29/2021 3:42 PM  Performed by: Lily Peer, Delsa Walder, CRNAPre-anesthesia Checklist: Patient identified, Emergency Drugs available, Suction available, Patient being monitored and Timeout performed Patient Re-evaluated:Patient Re-evaluated prior to induction Oxygen Delivery Method: Simple face mask Induction Type: IV induction

## 2021-11-29 NOTE — Progress Notes (Signed)
Pharmacy Antibiotic Note  Theodore [name] is a 88 y.o. male admitted on 11/28/2021 with cellulitis.  Pharmacy has been consulted for vancomycin and cefepime dosing. Renal function appears to be lower than apparent baseline. In the ED he received 1000 mg IV vancomycin  Plan:  1) Continue cefepime 2 grams IV every 12 hours  2) Adjust vancomycin to 1250 mg IV Q 24 hrs.  Goal AUC 400-550. Expected AUC: 514 SCr used: 1.22 mg/dL, TBW, Vd 0.72 Daily SCr while on IV vancomycin Ke 0.031 h-1, T1/2 22.2h     Temp (24hrs), Avg:98.7 F (37.1 C), Min:97.8 F (36.6 C), Max:99.8 F (37.7 C)  Recent Labs  Lab 11/28/21 1204 11/28/21 1930 11/29/21 0542  WBC 21.6*  --  11.1*  CREATININE 1.72*  --  1.22  LATICACIDVEN 2.1* 2.7*  --      Estimated Creatinine Clearance: 45.5 mL/min (by C-G formula based on SCr of 1.22 mg/dL).    Allergies  Allergen Reactions   Penicillins Rash and Other (See Comments)    Other reaction(s): Other (see comments) Other reaction(s): UNKNOWN   Lexapro [Escitalopram] Other (See Comments)    Increased hand tremor    Antimicrobials this admission: 09/18 cefepime >>  09/18 vancomycin >>   Microbiology results: 09/18 BCx: pending  Thank you for allowing pharmacy to be a part of this patient's care.  Dorothe Pea, PharmD, BCPS Clinical Pharmacist   11/29/2021 8:05 AM

## 2021-11-29 NOTE — Progress Notes (Signed)
Theodore [name] Hospital Day(s): 1.  Interval History:  Patient seen and examined No acute events or new complaints overnight.  Patient reports he is feeling better; still with right leg pain No fever, chills  Leukocytosis markedly improved; 11.1K Renal function improved; sCr - 1.22; UO - 300 ccs + unmeasured No electrolyte derangements On cefepime and vancomycin Plan for I&D in the OR this afternoon with Dr Hampton Abbot   Vital signs in last 24 hours: [min-max] current  Temp:  [97.8 F (36.6 C)-99.8 F (37.7 C)] 98.4 F (36.9 C) (09/19 0743) Pulse Rate:  [73-97] 82 (09/19 0743) Resp:  [14-29] 18 (09/19 0743) BP: (109-153)/(50-120) 129/50 (09/19 0743) SpO2:  [95 %-100 %] 97 % (09/19 0743) Weight:  [80 kg] 80 kg (09/18 1018)             Intake/Output last 2 shifts:  09/18 0701 - 09/19 0700 In: 1372.4 [P.O.:240; I.V.:1032.4; IV Piggyback:100] Out: 300 [Urine:300]   Physical Exam:  Constitutional: alert, cooperative and no distress  Respiratory: breathing non-labored at rest  Cardiovascular: regular rate and sinus rhythm  Integumentary: repaired laceration to the anterior right leg with surrounding erythema, will better evaluate in OR this afternoon   Labs:     Latest Ref Rng & Units 11/29/2021    5:42 AM 11/28/2021   12:04 PM 08/26/2021   10:57 AM  CBC  WBC 4.0 - 10.5 K/uL 11.1  21.6  6.1   Hemoglobin 13.0 - 17.0 g/dL 12.9  15.1  15.1   Hematocrit 39.0 - 52.0 % 39.0  45.3  44.7   Platelets 150 - 400 K/uL 185  251  217.0       Latest Ref Rng & Units 11/29/2021    5:42 AM 11/28/2021   12:04 PM 08/26/2021   10:57 AM  CMP  Glucose 70 - 99 mg/dL 89  124  72   BUN 8 - 23 mg/dL 28  Theodore [name]  28   Creatinine 0.61 - 1.24 mg/dL 1.22  1.72  1.21   Sodium 135 - 145 mmol/L 137  137  140   Potassium 3.5 - 5.1 mmol/L 3.8  4.6  4.4   Chloride 98 - 111 mmol/L 104  102  103   CO2 22 - 32 mmol/L '25  24  30   '$ Calcium 8.9 - 10.3 mg/dL 8.2  9.1  9.4   Total  Protein 6.0 - 8.3 g/dL   6.2   Total Bilirubin 0.2 - 1.2 mg/dL   1.4   Alkaline Phos 39 - 117 U/L   44   AST 0 - 37 U/L   18   ALT 0 - 53 U/L   17      Imaging studies: No new pertinent imaging studies   Assessment/Plan:  88 y.o. male with infected right lower leg wound   - Plan for debridement in the OR this afternoon with Dr Hampton Abbot  - All risks, benefits, and alternatives to above procedure(s) were discussed with the patient, all of his questions were answered to his expressed satisfaction, patient expresses he wishes to proceed, and informed consent was obtained.    - NPO for planned procedure  - Continue IV Abx (Cefepime and Vancomycin)  - Pain control prn   - Further management per primary service; we will follow    All of the above findings and recommendations were discussed with the patient, and the medical team, and all of patient's questions were answered to his  expressed satisfaction.  -- Edison Simon, PA-C [facility] 11/29/2021, 7:47 AM M-F: 7am - 4pm

## 2021-11-29 NOTE — Plan of Care (Signed)

## 2021-11-29 NOTE — Progress Notes (Signed)
PROGRESS NOTE    Theodore [name]  PYK:[id number] DOB: 02-14-1930 DOA: 11/28/2021 PCP: [name], MD   Brief Narrative:   88 y.o. Caucasian male with medical history significant for COPD, depression, CAD, hypothyroidism, and osteoarthritis, who presented to the ER with acute onset of right leg swelling with associated erythema and pain.  Patient fell on the edge of a plywood on Thursday.  Was seen in urgent care on Friday and had 13 stitches for laceration repair.  Upon admission he was diagnosed with right lower extremity cellulitis, general surgery was consulted.  Patient did not tolerate bedside suture removal due to significant pain therefore plans for or I&D today.  X-ray negative for any osteomyelitis or hardware failure from his total right knee arthroplasty.   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  Seen by general surgery, plans for OR I&D today. Supportive care.  Hx of Right knee replacement in 2003 per records.   Severe sepsis ([place]) - Parameters improving. Lactic acid elevated, plans for repeat.   AKI Admission Cr 1.72, today 1.22 (now around baseline)  Dyslipidemia Statin and Zetia.   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  GERD without esophagitis - PPI  Post Op will need PT/OT  DVT prophylaxis: enoxaparin (LOVENOX) injection 40 mg Start: 11/28/21 1800 Code Status: Full Code Family Communication:  Called [name]- went to vm  Status is: Inpatient Remains inpatient appropriate because: Cont hosp  stay for IV Abx, and I&D.       Subjective: Feels ok. Tells me this occurred when he suffered from injury while cleaning attic last week.  Went to an urgent care where multiple sutures were placed.  Erythema and redness continued to worsen.   Examination:  General exam: Appears calm and comfortable  Respiratory system: Clear to auscultation. Respiratory effort normal. Cardiovascular system: S1 & S2 heard, RRR. No JVD, murmurs, rubs, gallops or clicks. No pedal edema. Gastrointestinal system: Abdomen is nondistended, soft and nontender. No organomegaly or masses felt. Normal bowel sounds heard. Central nervous system: Alert and oriented. No focal neurological deficits. Extremities: Symmetric 5 x 5 power. Skin: Right lower extremity erythema and swelling noted especially on the anterior shin Psychiatry: Judgement and insight appear normal. Mood & affect appropriate.      Objective: Vitals:   11/28/21 2232 11/29/21 0057 11/29/21 0456 11/29/21 0743  BP: (!) 150/56 (!) 144/63 (!) 133/52 (!) 129/50  Pulse: 86 73 86 82  Resp: '18 18 18 18  '$ Temp: 97.8 F (36.6 C) 98.3 F (36.8 C) 99.3 F (37.4 C) 98.4 F (36.9 C)  TempSrc:  Oral    SpO2: 98% 97% 97% 97%    Intake/Output Summary (Last 24 hours) at 11/29/2021 0806 Last data filed at 11/29/2021 0503 Gross per 24 hour  Intake 1372.38 ml  Output 300 ml  Net 1072.38 ml   There were no vitals filed for this visit.   Data Reviewed:   CBC: Recent Labs  Lab 11/28/21 1204 11/29/21 0542  WBC 21.6* 11.1*  HGB 15.1 12.9*  HCT  45.3 39.0  MCV 96.0 95.4  PLT 251 253   Basic Metabolic Panel: Recent Labs  Lab 11/28/21 1204 11/29/21 0542  NA 137 137  K 4.6 3.8  CL 102 104  CO2 24 25  GLUCOSE 124* 89  BUN 35* 28*  CREATININE 1.72* 1.22  CALCIUM 9.1 8.2*   GFR: Estimated Creatinine Clearance: 45.5 mL/min (by C-G formula based on SCr of 1.22 mg/dL). Liver Function Tests: No results for input(s): "AST", "ALT", "ALKPHOS",  "BILITOT", "PROT", "ALBUMIN" in the last 168 hours. No results for input(s): "LIPASE", "AMYLASE" in the last 168 hours. No results for input(s): "AMMONIA" in the last 168 hours. Coagulation Profile: Recent Labs  Lab 11/29/21 0542  INR 1.4*   Cardiac Enzymes: No results for input(s): "CKTOTAL", "CKMB", "CKMBINDEX", "TROPONINI" in the last 168 hours. BNP (last 3 results) No results for input(s): "PROBNP" in the last 8760 hours. HbA1C: No results for input(s): "HGBA1C" in the last 72 hours. CBG: No results for input(s): "GLUCAP" in the last 168 hours. Lipid Profile: No results for input(s): "CHOL", "HDL", "LDLCALC", "TRIG", "CHOLHDL", "LDLDIRECT" in the last 72 hours. Thyroid Function Tests: No results for input(s): "TSH", "T4TOTAL", "FREET4", "T3FREE", "THYROIDAB" in the last 72 hours. Anemia Panel: No results for input(s): "VITAMINB12", "FOLATE", "FERRITIN", "TIBC", "IRON", "RETICCTPCT" in the last 72 hours. Sepsis Labs: Recent Labs  Lab 11/28/21 1204 11/28/21 1930 11/29/21 0542  PROCALCITON  --   --  0.38  LATICACIDVEN 2.1* 2.7*  --     Recent Results (from the past 240 hour(s))  Blood culture (routine x 2)     Status: None (Preliminary result)   Collection Time: 11/28/21 12:49 PM   Specimen: BLOOD  Result Value Ref Range Status   Specimen Description BLOOD BLOOD RIGHT FOREARM  Final   Special Requests   Final    BOTTLES DRAWN AEROBIC AND ANAEROBIC Blood Culture adequate volume   Culture   Final    NO GROWTH < 24 HOURS Performed at [facility], [address] [place], [place] [address]    Report Status PENDING  Incomplete  Blood culture (routine x 2)     Status: None (Preliminary result)   Collection Time: 11/28/21  1:10 PM   Specimen: BLOOD  Result Value Ref Range Status   Specimen Description BLOOD BLOOD RIGHT FOREARM  Final   Special Requests   Final    BOTTLES DRAWN AEROBIC AND ANAEROBIC Blood Culture results may not be optimal due to an inadequate  volume of blood received in culture bottles   Culture   Final    NO GROWTH < 24 HOURS Performed at [facility], [address] [place], [place] [address]    Report Status PENDING  Incomplete         Radiology Studies: DG Tibia/Fibula Right  Result Date: 11/28/2021 CLINICAL DATA:  Cellulitis right lower leg. Infection. Leg injury a few days ago and had stitches placed. Bandage in place at this time. EXAM: RIGHT TIBIA AND FIBULA - 2 VIEW COMPARISON:  None Available. FINDINGS: Postsurgical changes of total right knee arthroplasty. The superior aspect of the distal femoral hardware is seen on lateral view only. No perihardware lucency is seen to indicate hardware failure or loosening. There are 2 small chronic ossicles at the superior aspect of the tibial tubercle. No overlying anterior soft tissue swelling. Mild distal medial and lateral malleolar degenerative osteophytes at the ankle. No cortical erosion. No acute fracture is seen. No dislocation. Mild vascular calcifications. IMPRESSION:  1. Status post total right knee arthroplasty. No evidence of hardware failure. 2. No cortical erosion to indicate radiographic evidence of acute osteomyelitis. Electronically Signed   By: Yvonne Kendall M.D.   On: 11/28/2021 13:41        Scheduled Meds:  acidophilus  1 capsule Oral Daily   ascorbic acid  500 mg Oral Daily   buPROPion  150 mg Oral Daily   cholecalciferol  2,000 Units Oral Daily   cyanocobalamin  500 mcg Oral Daily   cycloSPORINE  1 drop Both Eyes BID   donepezil  10 mg Oral q morning   enoxaparin (LOVENOX) injection  40 mg Subcutaneous Q24H   ezetimibe  10 mg Oral Daily   levothyroxine  100 mcg Oral Q0600   magnesium oxide  400 mg Oral Daily   neomycin-polymyxin-hydrocortisone  4 drop Left EAR TID   rosuvastatin  10 mg Oral Daily   Continuous Infusions:  sodium chloride 100 mL/hr at 11/29/21 0503   ceFEPime (MAXIPIME) IV Stopped (11/28/21 1702)   [START ON 11/30/2021]  vancomycin       LOS: 1 day   Time spent= 35 mins    Sheehan Stacey Arsenio Loader, MD [facility]  If 7PM-7AM, please contact night-coverage  11/29/2021, 8:06 AM

## 2021-11-29 NOTE — Progress Notes (Signed)
Initial Nutrition Assessment  DOCUMENTATION CODES:   Non-severe (moderate) malnutrition in context of chronic illness  INTERVENTION:   -Once diet is resumed, add:   -Ensure Enlive po BID, each supplement provides 350 kcal and 20 grams of protein -MVI with minerals daily -500 mg vitamin C BID -220 mg zinc sulfate daily x 14 days  NUTRITION DIAGNOSIS:   Moderate Malnutrition related to chronic illness (COPD) as evidenced by mild fat depletion, moderate fat depletion, mild muscle depletion, moderate muscle depletion.  GOAL:   Patient will meet greater than or equal to 90% of their needs  MONITOR:   PO intake, Supplement acceptance, Diet advancement  REASON FOR ASSESSMENT:   Malnutrition Screening Tool    ASSESSMENT:   Pt with medical history significant for COPD, depression, CAD, hypothyroidism, and osteoarthritis, who presented to the with acute onset of right leg swelling with associated erythema and pain.  Patient fell on the edge of a plywood on Thursday.  Was seen in urgent care on Friday and had 13 stitches for laceration repair.  Pt admitted with sepsis secondary to cellulitis (rt anterior leg secondary to infected wound).   Reviewed I/O's: +1.1 L x 24 hours   UOP: 300 ml x 24 hours  Spoke with pt at bedside, who reports he is ready for surgery. Per general surgery notes, plan for I&D today. Pt is currently NPO for procedure. He was previously on a heart healthy diet and consumed 100% of meal.    Per pt, he had a fair appetite PTA. He was consuming 3 meals per day (Breakfast: banana nut muffin OR hawaiian roll with peanut butter and jelly, banana; Lunch: granola bar and apple; Dinner: meat, starch, and vegetable pizza). Pt shares that he has multiple missing teeth and is awaiting on specialized dentures that are more comfortable and are easier to talk with.   Pt estimates he has lost about 5# over the past week. Reviewed wt hx; no wt loss noted over the past 6  months.   Discussed importance of good meal and supplement intake to promote healing. Pt amenable to supplements.   Medications reviewed and include vitamin C, vitamin D3, vitamin B-12, lovenox, magnesium oxide, and 0.9% sodium chloride infusion @ 75 ml/hr.   Labs reviewed: CBGS: 80.   NUTRITION - FOCUSED PHYSICAL EXAM:  Flowsheet Row Most Recent Value  Orbital Region Moderate depletion  Upper Arm Region Mild depletion  Thoracic and Lumbar Region Mild depletion  Buccal Region Moderate depletion  Temple Region Moderate depletion  Clavicle Bone Region Moderate depletion  Clavicle and Acromion Bone Region Moderate depletion  Scapular Bone Region Moderate depletion  Dorsal Hand Moderate depletion  Patellar Region Moderate depletion  Anterior Thigh Region Moderate depletion  Posterior Calf Region Moderate depletion  Edema (RD Assessment) Moderate  Hair Reviewed  Eyes Reviewed  Mouth Reviewed  Skin Reviewed  Nails Reviewed       Diet Order:   Diet Order             Diet NPO time specified Except for: Sips with Meds  Diet effective midnight                   EDUCATION NEEDS:   Education needs have been addressed  Skin:  Skin Assessment: Reviewed RN Assessment  Last BM:  11/28/21 (type 2)  Height:   Ht Readings from Last 1 Encounters:  11/28/21 '6\' 2"'$  (1.88 m)    Weight:   Wt Readings from Last 1 Encounters:  11/28/21 80 kg    Ideal Body Weight:  86.4 kg  BMI:  There is no height or weight on file to calculate BMI.  Estimated Nutritional Needs:   Kcal:  2000-2200  Protein:  105-120 grams  Fluid:  > 2 L    Loistine Chance, RD, LDN, [place] Registered Dietitian II Certified Diabetes Care and Education Specialist Please refer to [facility] for RD and/or RD on-call/weekend/after hours pager

## 2021-11-29 NOTE — Transfer of Care (Signed)
Immediate Anesthesia Transfer of Care Note  Patient: Theodore [name]  Procedure(s) Performed: IRRIGATION AND DEBRIDEMENT EXTREMITY (Right: Leg Lower)  Patient Location: PACU  Anesthesia Type:General  Level of Consciousness: drowsy  Airway & Oxygen Therapy: Patient Spontanous Breathing and Patient connected to face mask oxygen  Post-op Assessment: Report given to RN and Post -op Vital signs reviewed and stable  Post vital signs: Reviewed and stable  Last Vitals:  Vitals Value Taken Time  BP 116/46 11/29/21 1633  Temp    Pulse 60 11/29/21 1634  Resp 15 11/29/21 1634  SpO2 100 % 11/29/21 1634  Vitals shown include unvalidated device data.  Last Pain:  Vitals:   11/29/21 1440  TempSrc: Temporal  PainSc: 3       Patients Stated Pain Goal: 0 (89/48/34 7583)  Complications: No notable events documented.

## 2021-11-29 NOTE — Op Note (Signed)
  Procedure Date:  11/29/2021  Pre-operative Diagnosis:  Infected post-traumatic wound of right lower extremity  Post-operative Diagnosis: Infected post-traumatic wound of right lower extremity  Procedure:  Incision and Drainage of right lower extremity wound, debridement of skin and subcutaneous tissue of right lower extremity for area 6 x 3 cm = 18 cm2.  Surgeon:  Melvyn Neth, MD  Anesthesia:  MAC  Estimated Blood Loss:  5 ml  Specimens:  Right lower extremity wound culture  Complications:  None  Indications for Procedure:  This is a 88 y.o. male with diagnosis of an infected post-traumatic wound to his right shin, requiring drainage procedure.  The risks of bleeding, abscess or infection, injury to surrounding structures, and need for further procedures were all discussed with the patient and was willing to proceed.  Description of Procedure: The patient was correctly identified in the preoperative area and brought into the operating room.  The patient was placed supine with VTE prophylaxis in place.  Appropriate time-outs were performed.  Anesthesia was induced and the patient was intubated.  Appropriate antibiotics were infused.  The patient's right lower extremity was prepped and draped in usual sterile fashion.  There was already erythematous streaking extending above the medial right knee to the medial thigh and purulent fluid drainage from the laceration repair.  Local anesthetic was infused intradermally.  All sutures were removed and the wound was reopened.  This revealed copious amount of purulent fluid.  The wound depth reached bone, and part of the tibia was fully exposed.  The skin edges of the wound were debrided, including skin and subcutaneous tissue, for total of 6 x 3 cm = 18 cm2.  This was excisional debridement using cautery.  Then, Yankauer was used to track superiorly and it was noted that the wound tracked medially and superiorly to just below the knee joint.  It  was decided to make 2 small 1 cm counter incisions to allow better drainage of that area.  A 1/4 inch penrose drain was looped between the two incisions and secured with 0 Silk ties.  Then the entire cavity was thoroughly irrigated using saline.  The open wound was packed with moistened 4x4 gauze, covered with dry 4x4 gauze including covering the penrose drain sites, covered with ABD pad, and secured using Kerlix roll and tape.  The patient was then emerged from anesthesia, extubated, and brought to the recovery room for further management.  The patient tolerated the procedure well and all counts were correct at the end of the case.   Melvyn Neth, MD

## 2021-11-29 NOTE — Anesthesia Preprocedure Evaluation (Signed)
Anesthesia Evaluation  Patient identified by MRN, date of birth, ID band Patient awake    Reviewed: Allergy & Precautions, H&P , NPO status , Patient's Chart, lab work & pertinent test results  History of Anesthesia Complications Negative for: history of anesthetic complications  Airway Mallampati: II  TM Distance: >3 FB Neck ROM: full    Dental no notable dental hx. (+) Chipped   Pulmonary neg pulmonary ROS, neg sleep apnea, neg COPD, Patient abstained from smoking.Not current smoker,    Pulmonary exam normal breath sounds clear to auscultation       Cardiovascular Exercise Tolerance: Good METS(-) hypertension(-) CAD and (-) Past MI negative cardio ROS Normal cardiovascular exam(-) dysrhythmias  Rhythm:Regular Rate:Normal - Systolic murmurs    Neuro/Psych PSYCHIATRIC DISORDERS Anxiety Depression negative neurological ROS     GI/Hepatic Neg liver ROS, GERD  Controlled,  Endo/Other  negative endocrine ROSneg diabetes  Renal/GU negative Renal ROS     Musculoskeletal   Abdominal   Peds  Hematology negative hematology ROS (+)   Anesthesia Other Findings Past Medical History: No date: Actinic keratosis No date: Arthritis     Comment:  knees No date: Asymptomatic Sinus bradycardia     Comment:  a. 05/2016 Zio: Avg HR 61 (41-167). No date: Benign essential tremor 2002: Cancer ([place])     Comment:  melanoma right ear No date: Colon polyp No date: COPD (chronic obstructive pulmonary disease) (HCC)     Comment:  pt said he believes it is a misdiagnosis  No date: Coronary artery calcification seen on CT scan     Comment:  a. 06/2016 CTA Chest: cor Ca2+; b. 05/2017 MV: EF >65%. No              ischemia/infarct. No date: Depression No date: History of echocardiogram     Comment:  a. 04/2016 Echo: EF 60-65%, mild conc LVH. Nl PASP. No date: History of kidney stones 05/07/2017: Hx of dysplastic nevus     Comment:  L  anterior shoulder, severe 09/06/2020: Hx of dysplastic nevus     Comment:  R lower sternum, moderate atypia No date: Hypothyroidism No date: Hypothyroidism 1990's per pt: Melanoma ([place])     Comment:  R ear 01/12/2014: Melanoma in situ ([place])     Comment:  left jaw No date: Palpitations     Comment:  a. 05/2016 Zio: Avg HR 61 (41-167). 9 SVT runs (fastest               167 - 5 beats; longest 8 beats - 101 bpm). Rare               PACs/PVCs. No date: PVC's (premature ventricular contractions) No date: Rhinitis No date: Wears dentures     Comment:  partial upper and lower No date: Wears hearing aid in both ears  Reproductive/Obstetrics                             Anesthesia Physical  Anesthesia Plan  ASA: 2  Anesthesia Plan: General   Post-op Pain Management: Minimal or no pain anticipated   Induction: Intravenous  PONV Risk Score and Plan: 2 and Propofol infusion, TIVA, Ondansetron and Treatment may vary due to age or medical condition  Airway Management Planned: Nasal Cannula  Additional Equipment: None  Intra-op Plan:   Post-operative Plan:   Informed Consent: I have reviewed the patients History and Physical, chart, labs and discussed the procedure including  the risks, benefits and alternatives for the proposed anesthesia with the patient or authorized representative who has indicated his/her understanding and acceptance.     Dental advisory given  Plan Discussed with: CRNA and Surgeon  Anesthesia Plan Comments: (Discussed risks of anesthesia with patient, including possibility of difficulty with spontaneous ventilation under anesthesia necessitating airway intervention, PONV, post operative cognitive dysfunction, and rare risks such as cardiac or respiratory or neurological events, and allergic reactions. Discussed the role of CRNA in patient's perioperative care. Patient understands.)        Anesthesia Quick Evaluation

## 2021-11-30 ENCOUNTER — Encounter: Payer: Self-pay | Admitting: Surgery

## 2021-11-30 DIAGNOSIS — G309 Alzheimer's disease, unspecified: Secondary | ICD-10-CM

## 2021-11-30 DIAGNOSIS — T148XXA Other injury of unspecified body region, initial encounter: Secondary | ICD-10-CM | POA: Diagnosis not present

## 2021-11-30 DIAGNOSIS — L0390 Cellulitis, unspecified: Secondary | ICD-10-CM | POA: Diagnosis not present

## 2021-11-30 DIAGNOSIS — A419 Sepsis, unspecified organism: Secondary | ICD-10-CM | POA: Diagnosis not present

## 2021-11-30 DIAGNOSIS — F0283 Dementia in other diseases classified elsewhere, unspecified severity, with mood disturbance: Secondary | ICD-10-CM

## 2021-11-30 DIAGNOSIS — L089 Local infection of the skin and subcutaneous tissue, unspecified: Secondary | ICD-10-CM

## 2021-11-30 LAB — MAGNESIUM: Magnesium: 1.9 mg/dL (ref 1.7–2.4)

## 2021-11-30 LAB — BASIC METABOLIC PANEL
Anion gap: 5 (ref 5–15)
BUN: 28 mg/dL — ABNORMAL HIGH (ref 8–23)
CO2: 23 mmol/L (ref 22–32)
Calcium: 7.9 mg/dL — ABNORMAL LOW (ref 8.9–10.3)
Chloride: 109 mmol/L (ref 98–111)
Creatinine, Ser: 1.12 mg/dL (ref 0.61–1.24)
GFR, Estimated: >60 mL/min (ref >60)
Glucose, Bld: 127 mg/dL — ABNORMAL HIGH (ref 70–99)
Potassium: 3.7 mmol/L (ref 3.5–5.1)
Sodium: 137 mmol/L (ref 135–145)

## 2021-11-30 LAB — CBC
HCT: 36.8 % — ABNORMAL LOW (ref 39.0–52.0)
Hemoglobin: 11.9 g/dL — ABNORMAL LOW (ref 13.0–17.0)
MCH: 30.8 pg (ref 26.0–34.0)
MCHC: 32.3 g/dL (ref 30.0–36.0)
MCV: 95.3 fL (ref 80.0–100.0)
Platelets: 210 10*3/uL (ref 150–400)
RBC: 3.86 MIL/uL — ABNORMAL LOW (ref 4.22–5.81)
RDW: 13.2 % (ref 11.5–15.5)
WBC: 8.5 10*3/uL (ref 4.0–10.5)
nRBC: 0 % (ref 0.0–0.2)

## 2021-11-30 NOTE — Progress Notes (Addendum)
  Progress Note   Patient: Theodore [name] XMI:[id number] DOB: March 12, 1930 DOA: 11/28/2021     2 DOS: the patient was seen and examined on 11/30/2021   Brief hospital course:  88 y.o. Caucasian male with medical history significant for COPD, depression, CAD, hypothyroidism, and osteoarthritis, who presented to the ER with acute onset of right leg swelling with associated erythema and pain.  He is diagnosed with right lower extremity cellulitis, placed on vancomycin and cefepime.  I&D was performed by general surgery on 9/19.  Culture sent out.  Assessment and Plan: Severe sepsis secondary to cellulitis Right lower extremity cellulitis with infected wound. Patient is status post I&D, condition stable/improving.  Continue current antibiotics with vancomycin and cefepime, while pending wound culture results.  Blood cultures so far negative.  Acute kidney injury secondary to severe sepsis. Condition back to baseline.  Alzheimer disease with depression. Continue home medicines.  COPD Coronary disease. Hypothyroidism. Conditions are stable, continue home regimens.       Subjective:  Patient has some baseline confusion, still complaining pain in the leg.  Physical Exam: Vitals:   11/29/21 1727 11/29/21 1954 11/30/21 0515 11/30/21 0751  BP: (!) 108/52 (!) 99/53 (!) 112/49 128/62  Pulse: 73 67 72 70  Resp: '17 18 20 '$ (!) 21  Temp: 98.8 F (37.1 C) 97.8 F (36.6 C) 98.5 F (36.9 C) 98.9 F (37.2 C)  TempSrc: Oral   Oral  SpO2: 97% 97% 97% 97%  Weight:      Height:       General exam: Appears calm and comfortable  Respiratory system: Clear to auscultation. Respiratory effort normal. Cardiovascular system: S1 & S2 heard, RRR. No JVD, murmurs, rubs, gallops or clicks. No pedal edema. Gastrointestinal system: Abdomen is nondistended, soft and nontender. No organomegaly or masses felt. Normal bowel sounds heard. Central nervous system: Alert and oriented x2. No focal neurological  deficits. Extremities: Right leg wound, skin redness better.. Skin: No rashes, lesions or ulcers Psychiatry:  Data Reviewed:  Lab results reviewed.  Family Communication: wife updated at bedside 1610  Disposition: Status is: Inpatient Remains inpatient appropriate because: Severity of disease, IV treatment.  Planned Discharge Destination: Skilled nursing facility    Time spent: 35 minutes  Author: Sharen Hones, MD 11/30/2021 3:22 PM  For on call review [email or web address].

## 2021-11-30 NOTE — Evaluation (Signed)
Physical Therapy Evaluation Patient Details Name: Theodore [name] MRN: [id number] DOB: Oct 11, 1929 Today's Date: 11/30/2021  History of Present Illness  Theodore [name] is a 88 y.o. Caucasian male with medical history significant for COPD, depression, CAD, hypothyroidism, and osteoarthritis, who presented to the ER with acute onset of right leg swelling with associated erythema and pain.  Patient fell on the edge of a plywood on Thursday.  Was seen in urgent care on Friday and had 13 stitches for laceration repair.  He was not given any antibiotics per his report.  Since then his right leg has been experiencing worsening of swelling with associated tenderness, erythema and pain.  Pt temperature was 99-100 per his wife. Pt now s/p Incision and Drainage of right lower extremity wound, debridement of skin and subcutaneous tissue of right lower extremity for area 6 x 3 cm = 18 cm2  Clinical Impression  Pt received in bed agreeable to participate in PT evaluation. Pt is HOH and decreased STM. Today's assessment revealed high pain levels, decreased strength and poor balance. Edeam in R lower leg noted.  Pt was Independent at home at house hold level but has history of falls. Pt eccentric control in BLE is poor. Generalized weakness, cognitive impairments and pain level puts pt at high risk of falls. Pt needs Mod assistance with all activities at this point and is unable to tolerate wt bearing on RLE. Wife has L foot drop form back problems and is demonstrating/ discussed caregiver burden.  Pt will benefit form SNF to improve mobility and safety after acute care. Spouse prefers [facility] as they resident of [facility].      Recommendations for follow up therapy are one component of a multi-disciplinary discharge planning process, led by the attending physician.  Recommendations may be updated based on patient status, additional functional criteria and insurance authorization.  Follow Up Recommendations  Skilled nursing-short term rehab (<3 hours/day)      Assistance Recommended at Discharge Frequent or constant Supervision/Assistance  Patient can return home with the following       Equipment Recommendations None recommended by PT  Recommendations for Other Services       Functional Status Assessment Patient has had a recent decline in their functional status and demonstrates the ability to make significant improvements in function in a reasonable and predictable amount of time.     Precautions / Restrictions Precautions Precautions: Fall Restrictions Weight Bearing Restrictions: Yes RLE Weight Bearing: Weight bearing as tolerated      Mobility  Bed Mobility Overal bed mobility: Needs Assistance Bed Mobility: Supine to Sit     Supine to sit: Min assist          Transfers Overall transfer level: Needs assistance Equipment used: Rolling walker (2 wheels) Transfers: Sit to/from Stand, Bed to chair/wheelchair/BSC Sit to Stand: Mod assist   Step pivot transfers: Min assist       General transfer comment: decreased Wt bearing ability on  RLE    Ambulation/Gait: too much pain                  Stairs: N/A            Wheelchair Mobility: N/T    Modified Rankin (Stroke Patients Only)       Balance Overall balance assessment: Needs assistance Sitting-balance support: No upper extremity supported, Feet unsupported Sitting balance-Leahy Scale: Good     Standing balance support: Bilateral upper extremity supported, Reliant on assistive device  for balance Standing balance-Leahy Scale: Poor Standing balance comment: Unable to achieve upright posutre due to pain.                             Pertinent Vitals/Pain Pain Assessment Pain Assessment: 0-10 Pain Score: 6     Home Living Family/patient expects to be discharged to:: Private residence Living Arrangements: Spouse/significant other Available Help at Discharge: Available 24  hours/day Type of Home: Other(Comment) (Duplex) Home Access: Level entry       Home Layout: Two level;Full bath on main level Home Equipment: Rolling Walker (2 wheels);Cane - single point      Prior Function Prior Level of Function : Independent/Modified Independent             Mobility Comments: Pt ind with dressing, bathing, eating, toileting and ambualtory at household level activity particiaption. Ambulated with 3 [employer]. ADLs Comments: Ind (But remains a high risk of falls)     Hand Dominance   Dominant Hand: Right    Extremity/Trunk Assessment   Upper Extremity Assessment Upper Extremity Assessment: Overall WFL for tasks assessed    Lower Extremity Assessment Lower Extremity Assessment: Generalized weakness;RLE deficits/detail RLE: Unable to fully assess due to pain RLE Sensation: WNL RLE Coordination: WNL       Communication      Cognition Arousal/Alertness: Awake/alert Behavior During Therapy: WFL for tasks assessed/performed Overall Cognitive Status: History of cognitive impairments - at baseline                                 General Comments: short term memory poor.        General Comments      Exercises General Exercises - Lower Extremity Ankle Circles/Pumps: AROM, 10 reps, Seated Heel Slides: AROM, 10 reps, Seated Other Exercises Other Exercises: Bridges 1 x 10 reps seated in Relciner   Assessment/Plan    PT Assessment Patient needs continued PT services  PT Problem List Decreased strength;Decreased range of motion;Decreased activity tolerance;Decreased balance;Decreased mobility;Decreased cognition;Decreased safety awareness;Pain;Decreased skin integrity       PT Treatment Interventions Gait training;Therapeutic activities;Functional mobility training;Therapeutic exercise;Balance training;Neuromuscular re-education;Cognitive remediation;Patient/family education    PT Goals (Current goals can be found in the Care Plan  section)  Acute Rehab PT Goals Patient Stated Goal: " Get stronger" PT Goal Formulation: With patient/family Time For Goal Achievement: 12/14/21 Potential to Achieve Goals: Good    Frequency Min 2X/week     Co-evaluation               AM-PAC PT "6 Clicks" Mobility  Outcome Measure Help needed turning from your back to your side while in a flat bed without using bedrails?: A Little Help needed moving from lying on your back to sitting on the side of a flat bed without using bedrails?: A Little Help needed moving to and from a bed to a chair (including a wheelchair)?: A Lot Help needed standing up from a chair using your arms (e.g., wheelchair or bedside chair)?: A Lot Help needed to walk in hospital room?: A Lot   6 Click Score: 12    End of Session Equipment Utilized During Treatment: Gait belt Activity Tolerance: Patient limited by pain Patient left: in chair;with call bell/phone within reach;with chair alarm set;with family/visitor present Nurse Communication: Mobility status PT Visit Diagnosis: Unsteadiness on feet (R26.81);Other abnormalities of gait and mobility (R26.89);Repeated  falls (R29.6);Difficulty in walking, not elsewhere classified (R26.2);Pain Pain - Right/Left: Right Pain - part of body: Leg    Time: 4753-3917 PT Time Calculation (min) (ACUTE ONLY): 42 min   Charges:   PT Evaluation $PT Eval Low Complexity: 1 Low PT Treatments $Therapeutic Exercise: 8-22 mins $Therapeutic Activity: 23-37 mins        Miyako Oelke PT DPT 2:49 PM,11/30/21

## 2021-11-30 NOTE — NC FL2 (Signed)
[place] LEVEL OF CARE SCREENING TOOL     IDENTIFICATION  Patient Name: Theodore [name] Birthdate: [birth year] Sex: male Admission Date (Current Location): 11/28/2021  [facility] and [place] Number:      Facility and Address:  [facility], [address] [place], [place] [address]      Provider Number: [id number]  Attending Physician Name and Address:  Sharen Hones, MD  Relative Name and Phone Number:  [name] [phone]    Current Level of Care: Hospital Recommended Level of Care: [place] Prior Approval Number:    Date Approved/Denied:   PASRR Number: [id number]  Discharge Plan: SNF    Current Diagnoses: Patient Active Problem List   Diagnosis Date Noted   Malnutrition of moderate degree 11/29/2021   Sepsis due to cellulitis ([place]) 11/28/2021   Dementia in Alzheimer's disease with depression ([place]) 11/28/2021   Dyslipidemia 11/28/2021   GERD without esophagitis 11/28/2021   Severe sepsis ([place]) 11/28/2021   Cellulitis of right leg    Laceration of right lower extremity    Wound infection, posttraumatic 09/22/2021   Proximal muscle weakness 08/27/2021   Speech disturbance 08/26/2021   Adverse drug reaction, initial encounter 05/04/2021   Left lumbar radiculopathy 04/25/2021   Thoracic aortic atherosclerosis ([place]) 01/17/2021   Dizziness 12/31/2020   Anxiety in acute stress reaction 12/07/2020   CKD (chronic kidney disease) stage 3, GFR 30-59 ml/min ([place]) 12/01/2020   Mild cognitive impairment with memory loss 11/30/2020   Elevated PSA, between 10 and less than 20 ng/ml 11/30/2020   COVID-19 virus infection 04/06/2020   Acquired trigger finger of left middle finger 08/07/2019   Carpal tunnel syndrome of right wrist 07/10/2019   Pain in right hand 06/05/2019   Ulnar neuropathy 06/05/2019   Essential tremor 05/28/2019   Disorder of bursae of shoulder region 01/29/2019   Localized, primary osteoarthritis  of shoulder region 01/29/2019   Localized, secondary osteoarthritis of the shoulder region 01/29/2019   Osteoarthritis of knee 01/29/2019   Shoulder joint pain 01/29/2019   Chronic obstructive pulmonary disease ([place]) 09/24/2018   Grief 09/02/2018   Cervical radiculopathy 08/14/2018   Neck pain 08/12/2018   Sinus bradycardia 03/21/2018   DOE (dyspnea on exertion) 03/21/2018   PVC (premature ventricular contraction) 03/21/2018   Weakness of both lower extremities 08/09/2017   Swallowing dysfunction 09/05/2016   Ringworm, body 06/27/2016   Bradycardia 05/10/2016   Coronary artery disease involving native coronary artery of native heart with angina pectoris (HCC)    PAD (peripheral artery disease) ([place])    Asthma 05/09/2016   Anterior knee pain, left 12/26/2015   Hypothyroidism 07/04/2014   Hyperlipidemia LDL goal <100 07/04/2014   Nonallergic vasomotor rhinitis 07/01/2014   Long-term use of high-risk medication 01/16/2014   Hypertrophy of prostate with urinary obstruction and other lower urinary tract symptoms (LUTS) 08/08/2013   Hypogonadism male 08/08/2013   Vitamin D deficiency 07/31/2013   Depression with anxiety 04/28/2013   Low serum testosterone level 04/28/2013   Actinic keratosis 05/10/2010   History of malignant melanoma of skin 05/10/2010   Inflamed seborrheic keratosis 05/10/2010    Orientation RESPIRATION BLADDER Height & Weight     Self, Time, Situation, Place  Normal Continent Weight: 80 kg Height:  '6\' 2"'$  (188 cm)  BEHAVIORAL SYMPTOMS/MOOD NEUROLOGICAL BOWEL NUTRITION STATUS  Other (Comment) (n/a)  (n/a) Continent Diet  AMBULATORY STATUS COMMUNICATION OF NEEDS Skin   Limited Assist Verbally Bruising  Personal Care Assistance Level of Assistance  Bathing, Feeding, Dressing Bathing Assistance: Limited assistance Feeding assistance: Limited assistance Dressing Assistance: Limited assistance     Functional Limitations Info    Sight  Info: Impaired Hearing Info: Impaired      SPECIAL CARE FACTORS FREQUENCY  PT (By licensed PT), OT (By licensed OT)     PT Frequency: Min 2xweekly OT Frequency: Min 2 xweekly            Contractures Contractures Info: Not present    Additional Factors Info  Code Status, Allergies Code Status Info: FULL Allergies Info: Penicillins, Lexapro (Escitalopram           Current Medications (11/30/2021):  This is the current hospital active medication list Current Facility-Administered Medications  Medication Dose Route Frequency Provider Last Rate Last Admin   acetaminophen (TYLENOL) tablet 650 mg  650 mg Oral Q6H PRN Piscoya, Jose, MD       Or   acetaminophen (TYLENOL) suppository 650 mg  650 mg Rectal Q6H PRN Piscoya, Jose, MD       acidophilus (RISAQUAD) capsule 1 capsule  1 capsule Oral Daily Piscoya, Jose, MD   1 capsule at 11/30/21 1028   ascorbic acid (VITAMIN C) tablet 500 mg  500 mg Oral BID Piscoya, Jose, MD   500 mg at 11/30/21 1028   buPROPion (WELLBUTRIN XL) 24 hr tablet 150 mg  150 mg Oral Daily Piscoya, Jose, MD   150 mg at 11/30/21 1028   ceFEPIme (MAXIPIME) 2 g in sodium chloride 0.9 % 100 mL IVPB  2 g Intravenous Q12H Piscoya, Jose, MD 200 mL/hr at 11/30/21 1028 2 g at 11/30/21 1028   cholecalciferol (VITAMIN D3) 25 MCG (1000 UNIT) tablet 2,000 Units  2,000 Units Oral Daily Piscoya, Jacqulyn Bath, MD   2,000 Units at 11/30/21 1028   cyanocobalamin (VITAMIN B12) tablet 500 mcg  500 mcg Oral Daily Piscoya, Jose, MD   500 mcg at 11/30/21 1029   cycloSPORINE (RESTASIS) 0.05 % ophthalmic emulsion 1 drop  1 drop Both Eyes BID Piscoya, Jose, MD   1 drop at 11/30/21 1029   donepezil (ARICEPT) tablet 10 mg  10 mg Oral q morning Piscoya, Jose, MD   10 mg at 11/30/21 1028   enoxaparin (LOVENOX) injection 40 mg  40 mg Subcutaneous Q24H Piscoya, Jose, MD   40 mg at 11/28/21 2234   ezetimibe (ZETIA) tablet 10 mg  10 mg Oral Daily Piscoya, Jacqulyn Bath, MD   10 mg at 11/30/21 1029   feeding  supplement (ENSURE ENLIVE / ENSURE PLUS) liquid 237 mL  237 mL Oral BID BM Piscoya, Jose, MD   237 mL at 11/30/21 1035   guaiFENesin (ROBITUSSIN) 100 MG/5ML liquid 5 mL  5 mL Oral Q4H PRN Piscoya, Jose, MD       hydrALAZINE (APRESOLINE) injection 10 mg  10 mg Intravenous Q4H PRN Piscoya, Jose, MD       ipratropium-albuterol (DUONEB) 0.5-2.5 (3) MG/3ML nebulizer solution 3 mL  3 mL Nebulization Q4H PRN Piscoya, Jose, MD       ketorolac (TORADOL) 30 MG/ML injection 15 mg  15 mg Intravenous Q6H PRN Piscoya, Jose, MD       levothyroxine (SYNTHROID) tablet 100 mcg  100 mcg Oral Q0600 Piscoya, Jose, MD   100 mcg at 11/30/21 0522   magnesium hydroxide (MILK OF MAGNESIA) suspension 30 mL  30 mL Oral Daily PRN Piscoya, Jose, MD       magnesium oxide (MAG-OX) tablet 400 mg  400 mg Oral Daily Piscoya, Jose, MD   400 mg at 11/30/21 1028   metoprolol tartrate (LOPRESSOR) injection 5 mg  5 mg Intravenous Q4H PRN Piscoya, Jose, MD       morphine (PF) 2 MG/ML injection 2 mg  2 mg Intravenous Q4H PRN Piscoya, Jose, MD   2 mg at 11/30/21 1937   multivitamin with minerals tablet 1 tablet  1 tablet Oral Daily Piscoya, Jose, MD   1 tablet at 11/30/21 1029   naloxone (NARCAN) injection 0.4 mg  0.4 mg Intravenous PRN Piscoya, Jacqulyn Bath, MD       ondansetron (ZOFRAN) tablet 4 mg  4 mg Oral Q6H PRN Piscoya, Jose, MD       Or   ondansetron (ZOFRAN) injection 4 mg  4 mg Intravenous Q6H PRN Piscoya, Jose, MD       oxyCODONE (Oxy IR/ROXICODONE) immediate release tablet 5 mg  5 mg Oral Q4H PRN Piscoya, Jose, MD   5 mg at 11/30/21 1212   rosuvastatin (CRESTOR) tablet 10 mg  10 mg Oral Daily Piscoya, Jose, MD   10 mg at 11/30/21 1029   senna-docusate (Senokot-S) tablet 1 tablet  1 tablet Oral QHS PRN Piscoya, Jacqulyn Bath, MD       traZODone (DESYREL) tablet 50 mg  50 mg Oral QHS PRN Piscoya, Jose, MD       vancomycin (VANCOREADY) IVPB 1250 mg/250 mL  1,250 mg Intravenous Q24H Piscoya, Jose, MD 166.7 mL/hr at 11/30/21 0019 1,250 mg at  11/30/21 0019   zinc sulfate capsule 220 mg  220 mg Oral Daily Piscoya, Jacqulyn Bath, MD   220 mg at 11/30/21 1028     Discharge Medications: Please see discharge summary for a list of discharge medications.  Relevant Imaging Results:  Relevant Lab Results:   Additional Information SS# [id number]  Laurena Slimmer, RN

## 2021-11-30 NOTE — Hospital Course (Addendum)
88 y.o. Caucasian male with medical history significant for COPD, depression, CAD, hypothyroidism, and osteoarthritis, who presented to the ER with acute onset of right leg swelling with associated erythema and pain.  He is diagnosed with right lower extremity cellulitis, placed on vancomycin and cefepime.  I&D was performed by general surgery on 9/19.  9/21. Blood culture no growth, wound culture grew Staphylococcus aureus, cellulitis appears not improving, antibiotic changed to daptomycin. 9/26.  Condition improved, wound has MSSA.  Antibiotics switched to cefazolin 9/25.  Pending nursing home placement.  Need to change to Keflex 1000 mg every 6 hours, last dose 12/15/2021.

## 2021-11-30 NOTE — Progress Notes (Signed)
FL2 completed Bed search initiated. Per therapy patient and wife would prefer [facility] as patient is a resident in the [facility] there.

## 2021-11-30 NOTE — Progress Notes (Signed)
[facility] Day(s): 2.   Post op day(s): 1 Day Post-Op.   Interval History:  Patient seen and examined No acute events or new complaints overnight.  Patient reports still having pain to the right shin/knee No fever, chills Leukocytosis resolved; 8.5K Labs are otherwise reassuring Cx from OR with GPC in pairs/clusters He continues on Cefepime and Vancomycin   Vital signs in last 24 hours: [min-max] current  Temp:  [96.8 F (36 C)-98.8 F (37.1 C)] 98.5 F (36.9 C) (09/20 0515) Pulse Rate:  [50-83] 72 (09/20 0515) Resp:  [15-20] 20 (09/20 0515) BP: (99-153)/(41-59) 112/49 (09/20 0515) SpO2:  [92 %-100 %] 97 % (09/20 0515) Weight:  [80 kg] 80 kg (09/19 1440)     Height: '6\' 2"'$  (188 cm) Weight: 80 kg BMI (Calculated): 22.63   Intake/Output last 2 shifts:  09/19 0701 - 09/20 0700 In: 1966.9 [I.V.:1566.9; IV Piggyback:400] Out: 1153 [Urine:1150; Blood:3]   Physical Exam:  Constitutional: alert, cooperative and no distress  Respiratory: breathing non-labored at rest  Cardiovascular: regular rate and sinus rhythm  Integumentary: 6 x 3 cm wound to the right anterior shin, bone is exposed, still with erythema to the surrounding skin, this does track cephalad on the medial aspect, similar to yesterday. There is also a penrose drain superior to this wound. Dressing changed   Labs:     Latest Ref Rng & Units 11/30/2021    5:03 AM 11/29/2021    5:42 AM 11/28/2021   12:04 PM  CBC  WBC 4.0 - 10.5 K/uL 8.5  11.1  21.6   Hemoglobin 13.0 - 17.0 g/dL 11.9  12.9  15.1   Hematocrit 39.0 - 52.0 % 36.8  39.0  45.3   Platelets 150 - 400 K/uL 210  185  251       Latest Ref Rng & Units 11/30/2021    5:03 AM 11/29/2021    5:42 AM 11/28/2021   12:04 PM  CMP  Glucose 70 - 99 mg/dL 127  89  124   BUN 8 - 23 mg/dL 28  28  35   Creatinine 0.61 - 1.24 mg/dL 1.12  1.22  1.72   Sodium 135 - 145 mmol/L 137  137  137   Potassium 3.5 - 5.1 mmol/L 3.7   3.8  4.6   Chloride 98 - 111 mmol/L 109  104  102   CO2 22 - 32 mmol/L '23  25  24   '$ Calcium 8.9 - 10.3 mg/dL 7.9  8.2  9.1      Imaging studies: No new pertinent imaging studies   Assessment/Plan: 88 y.o. male 1 Day Post-Op s/p I&D of right lower extremity wound, debridement of skin and subcutaneous tissue of right lower extremity for area (18 cm2)   - Wound Care: Pack with moistened 4x4 gauze, covered with dry 4x4 gauze including covering the penrose drain sites, covered with ABD pad, and secure with Kerlix roll and tape. Changed this morning 09/20; continue daily.   - Continue IV Abx (Cefepime, Vancomycin); follow up cultures  - Pain control prn  - Will monitor erythema to medial right shin/knee. If this worsens, we may need to consider repeat CT of the RLE to ensure no worsening infection - Further management per primary service; we will follow    All of the above findings and recommendations were discussed with the patient, and the medical team, and all of patient's questions were answered to his expressed satisfaction.  --  Edison Simon, PA-C [facility] 11/30/2021, 7:17 AM M-F: 7am - 4pm

## 2021-12-01 ENCOUNTER — Ambulatory Visit: Payer: PPO | Admitting: Urology

## 2021-12-01 ENCOUNTER — Inpatient Hospital Stay: Payer: PPO

## 2021-12-01 ENCOUNTER — Encounter: Payer: PPO | Admitting: Speech Pathology

## 2021-12-01 DIAGNOSIS — L089 Local infection of the skin and subcutaneous tissue, unspecified: Secondary | ICD-10-CM | POA: Diagnosis not present

## 2021-12-01 DIAGNOSIS — S81811A Laceration without foreign body, right lower leg, initial encounter: Secondary | ICD-10-CM | POA: Diagnosis not present

## 2021-12-01 DIAGNOSIS — N179 Acute kidney failure, unspecified: Secondary | ICD-10-CM | POA: Diagnosis not present

## 2021-12-01 DIAGNOSIS — R6520 Severe sepsis without septic shock: Secondary | ICD-10-CM | POA: Diagnosis not present

## 2021-12-01 DIAGNOSIS — T148XXA Other injury of unspecified body region, initial encounter: Secondary | ICD-10-CM | POA: Diagnosis not present

## 2021-12-01 DIAGNOSIS — A419 Sepsis, unspecified organism: Secondary | ICD-10-CM | POA: Diagnosis not present

## 2021-12-01 DIAGNOSIS — L0390 Cellulitis, unspecified: Secondary | ICD-10-CM | POA: Diagnosis not present

## 2021-12-01 LAB — CK: Total CK: 81 U/L (ref 49–397)

## 2021-12-01 LAB — BASIC METABOLIC PANEL
Anion gap: 6 (ref 5–15)
BUN: 26 mg/dL — ABNORMAL HIGH (ref 8–23)
CO2: 24 mmol/L (ref 22–32)
Calcium: 8.2 mg/dL — ABNORMAL LOW (ref 8.9–10.3)
Chloride: 107 mmol/L (ref 98–111)
Creatinine, Ser: 1.26 mg/dL — ABNORMAL HIGH (ref 0.61–1.24)
GFR, Estimated: 54 mL/min — ABNORMAL LOW (ref >60)
Glucose, Bld: 111 mg/dL — ABNORMAL HIGH (ref 70–99)
Potassium: 3.6 mmol/L (ref 3.5–5.1)
Sodium: 137 mmol/L (ref 135–145)

## 2021-12-01 LAB — CBC
HCT: 37 % — ABNORMAL LOW (ref 39.0–52.0)
Hemoglobin: 11.9 g/dL — ABNORMAL LOW (ref 13.0–17.0)
MCH: 31.1 pg (ref 26.0–34.0)
MCHC: 32.2 g/dL (ref 30.0–36.0)
MCV: 96.6 fL (ref 80.0–100.0)
Platelets: 207 10*3/uL (ref 150–400)
RBC: 3.83 MIL/uL — ABNORMAL LOW (ref 4.22–5.81)
RDW: 13.1 % (ref 11.5–15.5)
WBC: 8.1 10*3/uL (ref 4.0–10.5)
nRBC: 0 % (ref 0.0–0.2)

## 2021-12-01 MED ORDER — SODIUM CHLORIDE 0.9 % IV SOLN
6.0000 mg/kg | Freq: Every day | INTRAVENOUS | Status: DC
  Administered 2021-12-01 – 2021-12-04 (×4): 500 mg via INTRAVENOUS
  Filled 2021-12-01 (×5): qty 10

## 2021-12-01 MED ORDER — HYDROCORTISONE 1 % EX CREA
TOPICAL_CREAM | Freq: Three times a day (TID) | CUTANEOUS | Status: DC
  Administered 2021-12-03: 1 via TOPICAL
  Filled 2021-12-01 (×2): qty 28

## 2021-12-01 MED ORDER — TETANUS-DIPHTHERIA TOXOIDS TD 5-2 LFU IM INJ
0.5000 mL | INJECTION | Freq: Once | INTRAMUSCULAR | Status: AC
  Administered 2021-12-02: 0.5 mL via INTRAMUSCULAR
  Filled 2021-12-01: qty 0.5

## 2021-12-01 MED ORDER — IOHEXOL 300 MG/ML  SOLN
100.0000 mL | Freq: Once | INTRAMUSCULAR | Status: AC | PRN
  Administered 2021-12-01: 100 mL via INTRAVENOUS

## 2021-12-01 MED ORDER — SODIUM CHLORIDE 0.9 % IV SOLN
2.0000 g | Freq: Two times a day (BID) | INTRAVENOUS | Status: DC
  Administered 2021-12-01 – 2021-12-05 (×9): 2 g via INTRAVENOUS
  Filled 2021-12-01: qty 2
  Filled 2021-12-01 (×2): qty 12.5
  Filled 2021-12-01 (×3): qty 2
  Filled 2021-12-01 (×2): qty 12.5
  Filled 2021-12-01: qty 2
  Filled 2021-12-01: qty 12.5

## 2021-12-01 NOTE — Progress Notes (Addendum)
Pharmacy Antibiotic Note  Theodore [name] is a 88 y.o. male admitted on 11/28/2021 with cellulitis. PMH significant for COPD, hypothyroidism, arthritis, depression, HLD, dementia, GERD. Patient found to have RLE cellulitis with RLE wound infection secondary to Staph aureus. CT R foot not suspicious for OM. WBC downtrended to WNL, lactate now WNL. Patient underwent I&D on 9/19. Baseline CK 81. Of note, patient is on rosuvastatin and daptomycin which may increase risk for skeletal muscle toxicities. Pharmacy has been consulted for daptomycin and cefepime dosing.  Plan: Day 4 of antibiotics Switch from Vancomycin 1250 mg IV Q24H to Daptomycin 500 mg IV Q24H Continue Cefepime 2g Q12H Continue to monitor renal function and follow culture results  Check CK weekly while on daptomycin  Height: '6\' 2"'$  (188 cm) Weight: 80 kg (176 lb 5.9 oz) IBW/kg (Calculated) : 82.2  Temp (24hrs), Avg:98.2 F (36.8 C), Min:97.7 F (36.5 C), Max:99.1 F (37.3 C)  Recent Labs  Lab 11/28/21 1204 11/28/21 1930 11/29/21 0542 11/29/21 0833 11/30/21 0503 12/01/21 0528 12/01/21 1020  WBC 21.6*  --  11.1*  --  8.5  --  8.1  CREATININE 1.72*  --  1.22  --  1.12 1.26*  --   LATICACIDVEN 2.1* 2.7*  --  0.7  --   --   --      Estimated Creatinine Clearance: 44.1 mL/min (A) (by C-G formula based on SCr of 1.26 mg/dL (H)).    Allergies  Allergen Reactions   Penicillins Rash and Other (See Comments)    Other reaction(s): Other (see comments) Other reaction(s): UNKNOWN   Lexapro [Escitalopram] Other (See Comments)    Increased hand tremor    Antimicrobials this admission: 09/18 Vancomycin >> 09/20 09/18 Cefepime >>  09/21 Daptomycin >>  Microbiology results: 09/18 BCx: NG3D 09/19 Wound cx: moderate Staph aureus (susceptibilities pending)  Thank you for allowing pharmacy to be a part of this patient's care.  Gretel Acre, PharmD PGY1 Pharmacy Resident 12/01/2021 4:25 PM

## 2021-12-01 NOTE — Anesthesia Postprocedure Evaluation (Signed)
Anesthesia Post Note  Patient: Theodore [name]  Procedure(s) Performed: IRRIGATION AND DEBRIDEMENT EXTREMITY (Right: Leg Lower)  Patient location during evaluation: PACU Anesthesia Type: General Level of consciousness: awake and alert Pain management: pain level controlled Vital Signs Assessment: post-procedure vital signs reviewed and stable Respiratory status: spontaneous breathing, nonlabored ventilation, respiratory function stable and patient connected to nasal cannula oxygen Cardiovascular status: blood pressure returned to baseline and stable Postop Assessment: no apparent nausea or vomiting Anesthetic complications: no   No notable events documented.   Last Vitals:  Vitals:   12/01/21 0516 12/01/21 0956  BP: 116/61 (!) 119/55  Pulse: 62 61  Resp: 18 20  Temp: 36.7 C 36.5 C  SpO2: 95% 99%    Last Pain:  Vitals:   12/01/21 0956  TempSrc: Oral  PainSc:                  Arita Miss

## 2021-12-01 NOTE — Progress Notes (Signed)
PT Cancellation Note  Patient Details Name: Theodore [name] MRN: [id number] DOB: 1929/09/22   Cancelled Treatment:    Reason Eval/Treat Not Completed: Other (comment) (Pt has increased painin RLE and rash in his back.) Wife present by the bedside and requested to give pt something to reduce itching and discomfort. Nurse made aware of it. PT will attempt again tomorrow.   Joaquin Music PT DPT 3:53 PM,12/01/21

## 2021-12-01 NOTE — Progress Notes (Signed)
[place] [facility] Day(s): 3.   Post op day(s): 2 Days Post-Op.   Interval History:  Patient seen and examined No acute events or new complaints overnight.  Patient reports still having pain to the right shin/knee with erythema extending up medial leg Also having pain in his right ankle No fever, chills CBC not drawn; added Slight bump in sCr - 1.26 but this is near baseline; UO - 900 ccs Cx from OR with GPC in pairs/clusters He continues on Cefepime and Vancomycin   Vital signs in last 24 hours: [min-max] current  Temp:  [98 F (36.7 C)-98.9 F (37.2 C)] 98 F (36.7 C) (09/21 0516) Pulse Rate:  [62-76] 62 (09/21 0516) Resp:  [15-21] 18 (09/21 0516) BP: (116-151)/(52-71) 116/61 (09/21 0516) SpO2:  [95 %-100 %] 95 % (09/21 0516)     Height: '6\' 2"'$  (188 cm) Weight: 80 kg BMI (Calculated): 22.63   Intake/Output last 2 shifts:  09/20 0701 - 09/21 0700 In: 1137 [P.O.:437; IV Piggyback:700] Out: 900 [Urine:900]   Physical Exam:  Constitutional: alert, cooperative and no distress  Respiratory: breathing non-labored at rest  Cardiovascular: regular rate and sinus rhythm  Integumentary: Dressing to the right anterior shin wound; recently changed by RN staff. He continues to have similar appearance of erythema extending up the right medial knee/thigh.   Labs:     Latest Ref Rng & Units 11/30/2021    5:03 AM 11/29/2021    5:42 AM 11/28/2021   12:04 PM  CBC  WBC 4.0 - 10.5 K/uL 8.5  11.1  21.6   Hemoglobin 13.0 - 17.0 g/dL 11.9  12.9  15.1   Hematocrit 39.0 - 52.0 % 36.8  39.0  45.3   Platelets 150 - 400 K/uL 210  185  251       Latest Ref Rng & Units 12/01/2021    5:28 AM 11/30/2021    5:03 AM 11/29/2021    5:42 AM  CMP  Glucose 70 - 99 mg/dL 111  127  89   BUN 8 - 23 mg/dL '26  28  28   '$ Creatinine 0.61 - 1.24 mg/dL 1.26  1.12  1.22   Sodium 135 - 145 mmol/L 137  137  137   Potassium 3.5 - 5.1 mmol/L 3.6  3.7  3.8   Chloride 98 -  111 mmol/L 107  109  104   CO2 22 - 32 mmol/L '24  23  25   '$ Calcium 8.9 - 10.3 mg/dL 8.2  7.9  8.2      Imaging studies: No new pertinent imaging studies   Assessment/Plan: 88 y.o. male 2 Days Post-Op s/p I&D of right lower extremity wound, debridement of skin and subcutaneous tissue of right lower extremity for area (18 cm2)   - Given continued pain and erythema, we will get STAT CT of the RLE to ensure no further need for I&D  - Wound Care: Pack with moistened 4x4 gauze, covered with dry 4x4 gauze including covering the penrose drain sites, covered with ABD pad, and secure with Kerlix roll and tape.  - Continue IV Abx (Cefepime, Vancomycin); follow up cultures  - Pain control prn - Further management per primary service; we will follow    All of the above findings and recommendations were discussed with the patient, and the medical team, and all of patient's questions were answered to his expressed satisfaction.  -- Edison Simon, PA-C [facility] 12/01/2021, 7:26 AM M-F: [facility] -  4pm

## 2021-12-01 NOTE — Consult Note (Signed)
NAME: Theodore [name]  DOB: Feb 09, 1930  MRN: [id number]  Date/Time: 12/01/2021 8:17 PM  REQUESTING PROVIDER: Dr.Zhang Subjective:  REASON FOR CONSULT: rt shin wound ? Theodore [name] is a 88 y.o.male  with a history of benign essential tremors, falls, melanoma presented to the ED on 11/28/21 with worsening wound rt shin Pt on 11/24/21 was in the attic when his shin scraped on the plywood and he sustained a laceration- He cleaned it with hydrogen peroxide, put a dressing . The next day [facility] and had 13 sutures- no antibiotic was given, He came to the ED on 9/18 as the leg was swollen, erythematous with some discharge Vitals in the ED  11/28/21 10:15  BP 109/67  Temp 98 F (36.7 C)  Pulse Rate 97  Resp 18  SpO2 97 %    Latest Reference Range & Units 11/28/21 12:04  WBC 4.0 - 10.5 K/uL 21.6 (H)  Hemoglobin 13.0 - 17.0 g/dL 15.1  HCT 39.0 - 52.0 % 45.3  Platelets 150 - 400 K/uL 251  Creatinine 0.61 - 1.24 mg/dL 1.72 (H)  He was started on vanco and cefepime after cultures were sent Xray of the rt leg did not show any gas  He was seen by surgeon and taken for I/D and debridement of Skin and [place] tissue  6x 3 cm Prelim culture so far is staph aureus  Past Medical History:  Diagnosis Date   Actinic keratosis    Arthritis    knees   Asymptomatic Sinus bradycardia    a. 05/2016 Zio: Avg HR 61 (41-167).   Benign essential tremor    Cancer ([employer]) 2002   melanoma right ear   Colon polyp    COPD (chronic obstructive pulmonary disease) ([place])    pt said he believes it is a misdiagnosis    Coronary artery calcification seen on CT scan    a. 06/2016 CTA Chest: cor Ca2+; b. 05/2017 MV: EF >65%. No ischemia/infarct.   Depression    History of echocardiogram    a. 04/2016 Echo: EF 60-65%, mild conc LVH. Nl PASP.   History of kidney stones    Hx of dysplastic nevus 05/07/2017   L anterior shoulder, severe   Hx of dysplastic nevus 09/06/2020   R lower sternum, moderate atypia    Hypothyroidism    Hypothyroidism    Melanoma ([place]) 1990's per pt   R ear   Melanoma in situ ([place]) 01/12/2014   left jaw   Palpitations    a. 05/2016 Zio: Avg HR 61 (41-167). 9 SVT runs (fastest 167 - 5 beats; longest 8 beats - 101 bpm). Rare PACs/PVCs.   PVC's (premature ventricular contractions)    Rhinitis    Wears dentures    partial upper and lower   Wears hearing aid in both ears     Past Surgical History:  Procedure Laterality Date   ADENOIDECTOMY     CATARACT EXTRACTION W/PHACO Left 10/22/2017   Procedure: CATARACT EXTRACTION PHACO AND INTRAOCULAR LENS PLACEMENT (IOC)  LEFT;  Surgeon: Eulogio Bear, MD;  Location: [place];  Service: Ophthalmology;  Laterality: Left;   CATARACT EXTRACTION W/PHACO Right 11/20/2017   Procedure: CATARACT EXTRACTION PHACO AND INTRAOCULAR LENS PLACEMENT (IOC) RIGHT;  Surgeon: Eulogio Bear, MD;  Location: [place];  Service: Ophthalmology;  Laterality: Right;   ESOPHAGOGASTRODUODENOSCOPY (EGD) WITH PROPOFOL N/A 10/31/2016   Procedure: ESOPHAGOGASTRODUODENOSCOPY (EGD) WITH PROPOFOL;  Surgeon: Lollie Sails, MD;  Location: [facility];  Service: Endoscopy;  Laterality: N/A;   I & D EXTREMITY Right 11/29/2021   Procedure: IRRIGATION AND DEBRIDEMENT EXTREMITY;  Surgeon: Olean Ree, MD;  Location: [facility];  Service: General;  Laterality: Right;   JOINT REPLACEMENT Right 2003   knee   knee meniscus repair Right    MELANOMA EXCISION Right    ear. Followed by Dr. Nehemiah Massed   PATELLECTOMY Bilateral    TONSILLECTOMY      Social History   Socioeconomic History   Marital status: Married    Spouse name: [name]   Number of children: 4   Years of education: 16   Highest education level: Not on file  Occupational History   Occupation: Retired    Comment: Architect -   Tobacco Use   Smoking status: Never   Smokeless tobacco: Never  Vaping Use   Vaping Use: Never used  Substance and Sexual Activity   Alcohol  use: Yes    Comment: 1 glass of wine daily   Drug use: No   Sexual activity: Not Currently  Other Topics Concern   Not on file  Social History Narrative   Mr. Cleckler grew up in [place], [place]. 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 He and his wife are living at [facility]. He is very active at [facility]. He enjoys music.    Social Determinants of Health   Financial Resource Strain: Low Risk  (08/05/2021)   Overall Financial Resource Strain (CARDIA)    Difficulty of Paying Living Expenses: Not hard at all  Food Insecurity: No Food Insecurity (11/28/2021)   Hunger Vital Sign    Worried About Running Out of Food in the Last Year: Never true    Ran Out of Food in the Last Year: Never true  Transportation Needs: No Transportation Needs (11/28/2021)   PRAPARE - Hydrologist (Medical): No    Lack of Transportation (Non-Medical): No  Physical Activity: Insufficiently Active (08/05/2021)   Exercise Vital Sign    Days of Exercise per Week: 4 days    Minutes of Exercise per Session: 30 min  Stress: No Stress Concern Present (08/05/2021)   [place]    Feeling of Stress : Only a little  Social Connections: Socially Integrated (08/05/2021)   Social Connection and Isolation Panel [NHANES]    Frequency of Communication with Friends and Family: More than three times a week    Frequency of Social Gatherings with Friends and Family: Once a week    Attends Religious Services: 1 to 4 times per year    Active Member of [employer] or Organizations: Yes    Attends Music therapist: More than 4 times per year    Marital Status: Married  Human resources officer Violence: Not At Risk (11/28/2021)    Humiliation, Afraid, Rape, and Kick questionnaire    Fear of Current or Ex-Partner: No    Emotionally Abused: No    Physically Abused: No    Sexually Abused: No    Family History  Problem Relation Age of Onset   Hypertension Mother    Heart disease Mother        CHF   Heart disease Father    Cancer Sister        melanoma  Allergies  Allergen Reactions   Penicillins Rash and Other (See Comments)    Other reaction(s): Other (see comments) Other reaction(s): UNKNOWN   Lexapro [Escitalopram] Other (See Comments)    Increased hand tremor   I? Current Facility-Administered Medications  Medication Dose Route Frequency Provider Last Rate Last Admin   acetaminophen (TYLENOL) tablet 650 mg  650 mg Oral Q6H PRN Piscoya, Jose, MD   650 mg at 12/01/21 0055   Or   acetaminophen (TYLENOL) suppository 650 mg  650 mg Rectal Q6H PRN Piscoya, Jose, MD       acidophilus (RISAQUAD) capsule 1 capsule  1 capsule Oral Daily Piscoya, Jose, MD   1 capsule at 12/01/21 1102   ascorbic acid (VITAMIN C) tablet 500 mg  500 mg Oral BID Piscoya, Jose, MD   500 mg at 12/01/21 1102   buPROPion (WELLBUTRIN XL) 24 hr tablet 150 mg  150 mg Oral Daily Piscoya, Jose, MD   150 mg at 12/01/21 1104   ceFEPIme (MAXIPIME) 2 g in sodium chloride 0.9 % 100 mL IVPB  2 g Intravenous Q12H Sharen Hones, MD 200 mL/hr at 12/01/21 1123 2 g at 12/01/21 1123   cholecalciferol (VITAMIN D3) 25 MCG (1000 UNIT) tablet 2,000 Units  2,000 Units Oral Daily Piscoya, Jose, MD   2,000 Units at 12/01/21 1102   cyanocobalamin (VITAMIN B12) tablet 500 mcg  500 mcg Oral Daily Piscoya, Jose, MD   500 mcg at 12/01/21 1102   cycloSPORINE (RESTASIS) 0.05 % ophthalmic emulsion 1 drop  1 drop Both Eyes BID Piscoya, Jose, MD   1 drop at 12/01/21 1103   DAPTOmycin (CUBICIN) 500 mg in sodium chloride 0.9 % IVPB  6 mg/kg Intravenous Q2000 Belma Dyches, Joellyn Quails, MD       donepezil (ARICEPT) tablet 10 mg  10 mg Oral q morning Piscoya, Jose, MD   10 mg at  12/01/21 1101   enoxaparin (LOVENOX) injection 40 mg  40 mg Subcutaneous Q24H Piscoya, Jose, MD   40 mg at 12/01/21 1811   ezetimibe (ZETIA) tablet 10 mg  10 mg Oral Daily Piscoya, Jose, MD   10 mg at 12/01/21 1103   feeding supplement (ENSURE ENLIVE / ENSURE PLUS) liquid 237 mL  237 mL Oral BID BM Piscoya, Jose, MD   237 mL at 12/01/21 1104   guaiFENesin (ROBITUSSIN) 100 MG/5ML liquid 5 mL  5 mL Oral Q4H PRN Piscoya, Jose, MD       hydrALAZINE (APRESOLINE) injection 10 mg  10 mg Intravenous Q4H PRN Piscoya, Jose, MD       hydrocortisone cream 1 %   Topical TID Sharen Hones, MD   Given at 12/01/21 1621   ipratropium-albuterol (DUONEB) 0.5-2.5 (3) MG/3ML nebulizer solution 3 mL  3 mL Nebulization Q4H PRN Piscoya, Jose, MD       ketorolac (TORADOL) 30 MG/ML injection 15 mg  15 mg Intravenous Q6H PRN Piscoya, Jose, MD   15 mg at 12/01/21 1611   levothyroxine (SYNTHROID) tablet 100 mcg  100 mcg Oral Q0600 Piscoya, Jose, MD   100 mcg at 12/01/21 0517   magnesium hydroxide (MILK OF MAGNESIA) suspension 30 mL  30 mL Oral Daily PRN Piscoya, Jose, MD       magnesium oxide (MAG-OX) tablet 400 mg  400 mg Oral Daily Piscoya, Jose, MD   400 mg at 12/01/21 1102   metoprolol tartrate (LOPRESSOR) injection 5 mg  5 mg Intravenous Q4H PRN Olean Ree, MD       morphine (PF)  2 MG/ML injection 2 mg  2 mg Intravenous Q4H PRN Piscoya, Jose, MD   2 mg at 12/01/21 1331   multivitamin with minerals tablet 1 tablet  1 tablet Oral Daily Piscoya, Jose, MD   1 tablet at 12/01/21 1102   naloxone (NARCAN) injection 0.4 mg  0.4 mg Intravenous PRN Piscoya, Jacqulyn Bath, MD       ondansetron (ZOFRAN) tablet 4 mg  4 mg Oral Q6H PRN Piscoya, Jose, MD       Or   ondansetron (ZOFRAN) injection 4 mg  4 mg Intravenous Q6H PRN Piscoya, Jose, MD       oxyCODONE (Oxy IR/ROXICODONE) immediate release tablet 5 mg  5 mg Oral Q4H PRN Piscoya, Jose, MD   5 mg at 12/01/21 1100   rosuvastatin (CRESTOR) tablet 10 mg  10 mg Oral Daily Piscoya, Jose, MD    10 mg at 12/01/21 1101   senna-docusate (Senokot-S) tablet 1 tablet  1 tablet Oral QHS PRN Piscoya, Jacqulyn Bath, MD       traZODone (DESYREL) tablet 50 mg  50 mg Oral QHS PRN Piscoya, Jose, MD   50 mg at 11/30/21 2056   zinc sulfate capsule 220 mg  220 mg Oral Daily Piscoya, Jose, MD   220 mg at 12/01/21 1102     Abtx:  Anti-infectives (From admission, onward)    Start     Dose/Rate Route Frequency Ordered Stop   12/01/21 2000  DAPTOmycin (CUBICIN) 500 mg in sodium chloride 0.9 % IVPB        6 mg/kg  80 kg 120 mL/hr over 30 Minutes Intravenous Daily 12/01/21 1630     12/01/21 1215  ceFEPIme (MAXIPIME) 2 g in sodium chloride 0.9 % 100 mL IVPB        2 g 200 mL/hr over 30 Minutes Intravenous Every 12 hours 12/01/21 1118     11/30/21 0000  vancomycin (VANCOREADY) IVPB 1250 mg/250 mL  Status:  Discontinued        1,250 mg 166.7 mL/hr over 90 Minutes Intravenous Every 24 hours 11/29/21 0804 12/01/21 1630   11/29/21 0000  vancomycin (VANCOCIN) IVPB 1000 mg/200 mL premix  Status:  Discontinued        1,000 mg 200 mL/hr over 60 Minutes Intravenous Every 24 hours 11/28/21 1517 11/29/21 0804   11/28/21 1600  ceFEPIme (MAXIPIME) 2 g in sodium chloride 0.9 % 100 mL IVPB  Status:  Discontinued        2 g 200 mL/hr over 30 Minutes Intravenous Every 12 hours 11/28/21 1517 12/01/21 1051   11/28/21 1500  ceFEPIme (MAXIPIME) 2 g in sodium chloride 0.9 % 100 mL IVPB  Status:  Discontinued        2 g 200 mL/hr over 30 Minutes Intravenous  Once 11/28/21 1457 11/28/21 1517   11/28/21 1500  vancomycin (VANCOCIN) IVPB 1000 mg/200 mL premix  Status:  Discontinued        1,000 mg 200 mL/hr over 60 Minutes Intravenous  Once 11/28/21 1457 11/28/21 1517   11/28/21 1300  vancomycin (VANCOCIN) IVPB 1000 mg/200 mL premix        1,000 mg 200 mL/hr over 60 Minutes Intravenous  Once 11/28/21 1252 11/28/21 1418       REVIEW OF SYSTEMS:  Const: negative fever, negative chills, negative weight loss Eyes: negative  diplopia or visual changes, negative eye pain ENT: negative coryza, negative sore throat Resp: negative cough, hemoptysis, dyspnea Cards: negative for chest pain, palpitations, lower extremity edema GU: negative  for frequency, dysuria and hematuria GI: Negative for abdominal pain, diarrhea, bleeding, constipation Skin: negative for rash and pruritus Heme: negative for easy bruising and gum/nose bleeding MS: severe pain rt leg Neurolo:tremors , falls Psych: anxiety, depression  Endocrine: negative for thyroid, diabetes Allergy/Immunology- as above: Objective:  VITALS:  BP (!) 102/41 (BP Location: Left Arm)   Pulse 69   Temp 99.1 F (37.3 C)   Resp 18   Ht '6\' 2"'$  (1.88 m)   Wt 80 kg   SpO2 94%   BMI 22.64 kg/m   PHYSICAL EXAM:  General: Alert, cooperative, thin built Head: Normocephalic, without obvious abnormality, atraumatic. Eyes: b/l blepharitis ENT Nares normal. No drainage or sinus tenderness. Lips, mucosa, and tongue normal. No Thrush Neck: Supple, symmetrical, no adenopathy, thyroid: non tender no carotid bruit and no JVD. Back: No CVA tenderness. Lungs:b/la ir entry Heart: s1s2 Abdomen: Soft, non-tender,not distended. Bowel sounds normal. No masses Extremities: rt leg- dressing not removed     Skin: bruising Lymph: Cervical, supraclavicular normal. Neurologic: Grossly non-focal Back papular eruption    Pertinent Labs Lab Results    Latest Ref Rng & Units 12/01/2021   10:20 AM 11/30/2021    5:03 AM 11/29/2021    5:42 AM  CBC  WBC 4.0 - 10.5 K/uL 8.1  8.5  11.1   Hemoglobin 13.0 - 17.0 g/dL 11.9  11.9  12.9   Hematocrit 39.0 - 52.0 % 37.0  36.8  39.0   Platelets 150 - 400 K/uL 207  210  185          Latest Ref Rng & Units 12/01/2021    5:28 AM 11/30/2021    5:03 AM 11/29/2021    5:42 AM  CMP  Glucose 70 - 99 mg/dL 111  127  89   BUN 8 - 23 mg/dL '26  28  28   '$ Creatinine 0.61 - 1.24 mg/dL 1.26  1.12  1.22   Sodium 135 - 145 mmol/L 137  137  137    Potassium 3.5 - 5.1 mmol/L 3.6  3.7  3.8   Chloride 98 - 111 mmol/L 107  109  104   CO2 22 - 32 mmol/L '24  23  25   '$ Calcium 8.9 - 10.3 mg/dL 8.2  7.9  8.2       Microbiology: Recent Results (from the past 240 hour(s))  Blood culture (routine x 2)     Status: None (Preliminary result)   Collection Time: 11/28/21 12:49 PM   Specimen: BLOOD  Result Value Ref Range Status   Specimen Description BLOOD BLOOD RIGHT FOREARM  Final   Special Requests   Final    BOTTLES DRAWN AEROBIC AND ANAEROBIC Blood Culture adequate volume   Culture   Final    NO GROWTH 3 DAYS Performed at [facility], [address], [place], [place] [address]    Report Status PENDING  Incomplete  Blood culture (routine x 2)     Status: None (Preliminary result)   Collection Time: 11/28/21  1:10 PM   Specimen: BLOOD  Result Value Ref Range Status   Specimen Description BLOOD BLOOD RIGHT FOREARM  Final   Special Requests   Final    BOTTLES DRAWN AEROBIC AND ANAEROBIC Blood Culture results may not be optimal due to an inadequate volume of blood received in culture bottles   Culture   Final    NO GROWTH 3 DAYS Performed at [facility], [address], [place], [place] [address]  Report Status PENDING  Incomplete  Aerobic/Anaerobic Culture w Gram Stain (surgical/deep wound)     Status: None (Preliminary result)   Collection Time: 11/29/21  3:56 PM   Specimen: PATH Other; Tissue  Result Value Ref Range Status   Specimen Description   Final    WOUND RIGHTLOWERLEGABSCESS Performed at [facility], [address], [place], [place] [address]    Special Requests   Final    NONE Performed at [facility], [address] [place], [place] [address]    Gram Stain   Final    ABUNDANT WBC PRESENT, PREDOMINANTLY PMN FEW GRAM POSITIVE COCCI IN PAIRS IN CLUSTERS Performed at [facility], [place] [address], [place], [place] [address]    Culture   Final    MODERATE  STAPHYLOCOCCUS AUREUS SUSCEPTIBILITIES TO FOLLOW NO ANAEROBES ISOLATED; CULTURE IN PROGRESS FOR 5 DAYS    Report Status PENDING  Incomplete    IMAGING RESULTS: CT femur rt- complex rt knee effusion I have personally reviewed the films ? Impression/Recommendation ? ?Rt shin wound following trauma - infected- necoriss s/p debridement Culture so far Staph aureus Pt was on vanco and cefepime and leucocytosis has resolved after debridement Vanco  being changed to dapto because of crcl  Pt will benefit more from linezolid  as better staph soft tissue coverage HE is on Wellbutrin which puts him at risk for interaction and Serotonin syndrome Evaluate tomorrow and if no significant improvement may have to consider this with reduction in wellbutrin dose- to be evaluated by antimicrobial pharmacist tomorrow If no gram neg in the the culture tomorrow will Dc cefepime  Will give Td ?  rt knee effusion- observe for now as we don not want to introduce bacteria by doing an arthrocentesis  Papular eruption on the back is related to sweating Does not look like a drug allergy __________________________________________________ Discussed with patient, wife and care team [facility] covering Friday/sat and Sunday Available by phone for urgent issues- call if needed Note:  This document was prepared using Dragon voice recognition software and may include unintentional dictation errors.

## 2021-12-01 NOTE — Progress Notes (Addendum)
  Progress Note   Patient: Theodore [name] HWE:[id number] DOB: Jun 10, 1929 DOA: 11/28/2021     3 DOS: the patient was seen and examined on 12/01/2021   Brief hospital course:  88 y.o. Caucasian male with medical history significant for COPD, depression, CAD, hypothyroidism, and osteoarthritis, who presented to the ER with acute onset of right leg swelling with associated erythema and pain.  He is diagnosed with right lower extremity cellulitis, placed on vancomycin and cefepime.  I&D was performed by general surgery on 9/19.  9/21. Blood culture no growth, wound culture grew Staphylococcus aureus, cellulitis appears not improving, ID consult obtained.  Assessment and Plan: Severe sepsis secondary to cellulitis Right lower extremity cellulitis with infected wound secondary to Staphylococcus aureus. Had extensive debridement of right lower extremity wound infection, but the redness appears to extend to the thigh.  Repeated CT scan appears to have some fluid collection in the thigh area. Blood cultures so far negative, wound culture positive for Staph aureus.  Patient still on vancomycin and cefepime. We will obtain consult from ID. General surgery is following closely for thigh fluid collection, we will obtain repeat a CT scan on 9/23.  May consider debridement again.  Discussed with Dr. Hampton Abbot, currently there is no evidence of necrotizing fasciitis.   Acute kidney injury secondary to severe sepsis. Condition back to baseline.   Alzheimer disease with depression. Continue home medicines.   COPD Coronary disease. Hypothyroidism. Conditions are stable, continue home regimens.  Moderate protein calorie malnutrition. Continue protein supplement.   Skin rashes. Patient developed skin rash in his upper back.  Maculopapular rashes.  He also had a significant sweaty in the back.  No rash in the front chest or extremities.  Does not appear typical of drug rash.  We will place hydrocortisone  ointment.  Follow closely, will consider discontinue cefepime if rashes worsened and spread to the chest and extremities.   Subjective:  Patient still has pain in the right leg.  No fever or chills, no nausea vomiting.  Physical Exam: Vitals:   11/30/21 1646 11/30/21 2049 12/01/21 0516 12/01/21 0956  BP: (!) 128/52 (!) 151/71 116/61 (!) 119/55  Pulse: 75 76 62 61  Resp: '15 15 18 20  '$ Temp: 98.1 F (36.7 C) 98.3 F (36.8 C) 98 F (36.7 C) 97.7 F (36.5 C)  TempSrc:  Oral Oral Oral  SpO2: 100% 98% 95% 99%  Weight:      Height:       General exam: Appears calm and comfortable  Respiratory system: Clear to auscultation. Respiratory effort normal. Cardiovascular system: S1 & S2 heard, RRR. No JVD, murmurs, rubs, gallops or clicks. No pedal edema. Gastrointestinal system: Abdomen is nondistended, soft and nontender. No organomegaly or masses felt. Normal bowel sounds heard. Central nervous system: Alert and oriented x2. No focal neurological deficits. Extremities: Right leg extensive wound, right thigh redness.  Tender to touch. Skin: No rashes, lesions or ulcers Psychiatry: Mood & affect appropriate.   Data Reviewed:  CT results reviewed, lab results reviewed.  Family Communication: wife updated 1600  Disposition: Status is: Inpatient Remains inpatient appropriate because: Severity of disease, IV antibiotics, inpatient procedures.  Planned Discharge Destination: Skilled nursing facility    Time spent: 50 minutes  Author: Sharen Hones, MD 12/01/2021 2:13 PM  For on call review [email or web address].

## 2021-12-01 NOTE — Care Management Important Message (Signed)
Important Message  Patient Details  Name: Theodore [name] MRN: [id number] Date of Birth: 02-26-1930   Medicare Important Message Given:  N/A - LOS <3 / Initial given by admissions     [name] 12/01/2021, 8:10 AM

## 2021-12-01 NOTE — Progress Notes (Signed)
Dressing changed according to order. Pt medicated with morphine, still did not tolerate well.  No purulence,  wound bed is red with minimal serosanguineous drainage. Still has surrounding erythema in the upper medial lower leg and knee area.

## 2021-12-02 DIAGNOSIS — L0390 Cellulitis, unspecified: Secondary | ICD-10-CM | POA: Diagnosis not present

## 2021-12-02 DIAGNOSIS — A419 Sepsis, unspecified organism: Secondary | ICD-10-CM | POA: Diagnosis not present

## 2021-12-02 DIAGNOSIS — T148XXA Other injury of unspecified body region, initial encounter: Secondary | ICD-10-CM | POA: Diagnosis not present

## 2021-12-02 DIAGNOSIS — R6520 Severe sepsis without septic shock: Secondary | ICD-10-CM | POA: Diagnosis not present

## 2021-12-02 LAB — CBC
HCT: 35.4 % — ABNORMAL LOW (ref 39.0–52.0)
Hemoglobin: 11.5 g/dL — ABNORMAL LOW (ref 13.0–17.0)
MCH: 30.7 pg (ref 26.0–34.0)
MCHC: 32.5 g/dL (ref 30.0–36.0)
MCV: 94.4 fL (ref 80.0–100.0)
Platelets: 245 10*3/uL (ref 150–400)
RBC: 3.75 MIL/uL — ABNORMAL LOW (ref 4.22–5.81)
RDW: 12.9 % (ref 11.5–15.5)
WBC: 7.4 10*3/uL (ref 4.0–10.5)
nRBC: 0 % (ref 0.0–0.2)

## 2021-12-02 LAB — BASIC METABOLIC PANEL
Anion gap: 5 (ref 5–15)
BUN: 28 mg/dL — ABNORMAL HIGH (ref 8–23)
CO2: 25 mmol/L (ref 22–32)
Calcium: 8.4 mg/dL — ABNORMAL LOW (ref 8.9–10.3)
Chloride: 109 mmol/L (ref 98–111)
Creatinine, Ser: 1.13 mg/dL (ref 0.61–1.24)
GFR, Estimated: >60 mL/min (ref >60)
Glucose, Bld: 125 mg/dL — ABNORMAL HIGH (ref 70–99)
Potassium: 3.8 mmol/L (ref 3.5–5.1)
Sodium: 139 mmol/L (ref 135–145)

## 2021-12-02 LAB — HEPATIC FUNCTION PANEL
ALT: 15 U/L (ref 0–44)
AST: 20 U/L (ref 15–41)
Albumin: 2.4 g/dL — ABNORMAL LOW (ref 3.5–5.0)
Alkaline Phosphatase: 71 U/L (ref 38–126)
Bilirubin, Direct: 0.1 mg/dL (ref 0.0–0.2)
Indirect Bilirubin: 0.4 mg/dL (ref 0.3–0.9)
Total Bilirubin: 0.5 mg/dL (ref 0.3–1.2)
Total Protein: 5.5 g/dL — ABNORMAL LOW (ref 6.5–8.1)

## 2021-12-02 MED ORDER — OXYCODONE HCL 5 MG PO TABS
10.0000 mg | ORAL_TABLET | ORAL | Status: DC | PRN
  Administered 2021-12-02 – 2021-12-07 (×16): 10 mg via ORAL
  Filled 2021-12-02 (×18): qty 2

## 2021-12-02 NOTE — Progress Notes (Signed)
PT Cancellation Note  Patient Details Name: Theodore [name] MRN: [id number] DOB: 12-10-1929   Cancelled Treatment:    Reason Eval/Treat Not Completed: Other (comment) (Chart reviewed, treatment attempted. Despite good timing of pain medications, pt's pain remains exceedingly high, not permitting comfort for simple linen adjustment, much less PT treatment.) We decided to reach out to [place] regarding any additional pain control measures before making attempts to proceed with PT session. Will attempt again later in day.   10:25 AM, 12/02/21 Etta Grandchild, PT, DPT Physical Therapist - [facility]  [phone] ([place])     [name] 12/02/2021, 10:23 AM

## 2021-12-02 NOTE — Progress Notes (Addendum)
  Progress Note   Patient: Theodore [name] OPF:[id number] DOB: [birth year] DOA: 11/28/2021     4 DOS: the patient was seen and examined on 12/02/2021   Brief hospital course:  88 y.o. Caucasian male with medical history significant for COPD, depression, CAD, hypothyroidism, and osteoarthritis, who presented to the ER with acute onset of right leg swelling with associated erythema and pain.  He is diagnosed with right lower extremity cellulitis, placed on vancomycin and cefepime.  I&D was performed by general surgery on 9/19.  9/21. Blood culture no growth, wound culture grew Staphylococcus aureus, cellulitis appears not improving, antibiotic changed to daptomycin.  Assessment and Plan: Severe sepsis secondary to cellulitis Right lower extremity cellulitis with infected wound secondary to Staphylococcus aureus. Had extensive debridement of right lower extremity wound infection, but the redness appears to extend to the thigh.  Repeated CT scan appears to have some fluid collection in the thigh area. Blood cultures so far negative, wound culture positive for Staph aureus.   Patient is seen by ID, antibiotics switched to daptomycin.  Condition seem to be improving, patient has less tenderness in the thigh area. General surgery is repeating CT scan tomorrow to make sure the abscess is not getting worse.   Acute kidney injury secondary to severe sepsis. Condition back to baseline.   Alzheimer disease with depression. Continue home medicines.   COPD Coronary disease. Hypothyroidism. Conditions are stable, continue home regimens.   Moderate protein calorie malnutrition. Continue protein supplement.   Skin rashes. Condition improving after giving topical hydrocortisone.  This is not due to drug allergy.     Subjective:  Patient still complaining of pain in the right lower extremity, worse with movement.  No fever or chills.  Physical Exam: Vitals:   12/01/21 1532 12/01/21 2019 12/02/21 0538  12/02/21 0805  BP: (!) 102/41 136/61 (!) 115/53 (!) 121/58  Pulse: 69 63 (!) 55 (!) 59  Resp: '18 17 16 16  '$ Temp: 99.1 F (37.3 C) 98.8 F (37.1 C) 97.7 F (36.5 C) (!) 97.5 F (36.4 C)  TempSrc:      SpO2: 94% 100% 100% 98%  Weight:      Height:       General exam: Appears calm and comfortable  Respiratory system: Clear to auscultation. Respiratory effort normal. Cardiovascular system: S1 & S2 heard, RRR. No JVD, murmurs, rubs, gallops or clicks. No pedal edema. Gastrointestinal system: Abdomen is nondistended, soft and nontender. No organomegaly or masses felt. Normal bowel sounds heard. Central nervous system: Alert and oriented. No focal neurological deficits. Extremities: Right thigh redness is better, less tender. Skin: No rashes, lesions or ulcers Psychiatry: Judgement and insight appear normal. Mood & affect appropriate.   Data Reviewed:  Lab results reviewed.  Family Communication: wife updated at 33  Disposition: Status is: Inpatient Remains inpatient appropriate because: Severity of disease, IV treatment.  Planned Discharge Destination: Skilled nursing facility    Time spent: 35 minutes  Author: Sharen Hones, MD 12/02/2021 11:29 AM  For on call review [email or web address].

## 2021-12-02 NOTE — Progress Notes (Addendum)
[place] Hospital Day(s): 4.   Post op day(s): 3 Days Post-Op.   Interval History:  Patient seen and examined No acute events or new complaints overnight.  Patient reports he continues to have similar RLE pain; ankle and shin, no left thigh pain No fever, chills He remains without leukocytosis; 7.4K Hgb stable to 11.5 Renal function improved; sCr - 1.13 but this is near baseline; UO - 1075 ccs Cx from OR with staph aureus; ID on board; now on Cefepime and Daptomycin; susceptibilities pending  He continues on Cefepime and Vancomycin   Vital signs in last 24 hours: [min-max] current  Temp:  [97.7 F (36.5 C)-99.1 F (37.3 C)] 97.7 F (36.5 C) (09/22 0538) Pulse Rate:  [55-69] 55 (09/22 0538) Resp:  [16-20] 16 (09/22 0538) BP: (102-136)/(41-61) 115/53 (09/22 0538) SpO2:  [94 %-100 %] 100 % (09/22 0538)     Height: '6\' 2"'$  (188 cm) Weight: 80 kg BMI (Calculated): 22.63   Intake/Output last 2 shifts:  09/21 0701 - 09/22 0700 In: 500 [P.O.:240; IV Piggyback:260] Out: 2585 [Urine:1075]   Physical Exam:  Constitutional: alert, cooperative and no distress  Respiratory: breathing non-labored at rest  Cardiovascular: regular rate and sinus rhythm  Integumentary: 6 x 3 cm wound to the right anterior shin, bone is exposed, still with erythema to the surrounding skin, this does track cephalad on the medial aspect of the left thigh, similar to previous days and he fortunately does not appear tender there. There is also a penrose drain superior to this wound. Dressing changed   RLE Wound (12/02/2021):     Labs:     Latest Ref Rng & Units 12/02/2021    4:33 AM 12/01/2021   10:20 AM 11/30/2021    5:03 AM  CBC  WBC 4.0 - 10.5 K/uL 7.4  8.1  8.5   Hemoglobin 13.0 - 17.0 g/dL 11.5  11.9  11.9   Hematocrit 39.0 - 52.0 % 35.4  37.0  36.8   Platelets 150 - 400 K/uL 245  207  210       Latest Ref Rng & Units 12/02/2021    4:33 AM 12/01/2021     5:28 AM 11/30/2021    5:03 AM  CMP  Glucose 70 - 99 mg/dL 125  111  127   BUN 8 - 23 mg/dL '28  26  28   '$ Creatinine 0.61 - 1.24 mg/dL 1.13  1.26  1.12   Sodium 135 - 145 mmol/L 139  137  137   Potassium 3.5 - 5.1 mmol/L 3.8  3.6  3.7   Chloride 98 - 111 mmol/L 109  107  109   CO2 22 - 32 mmol/L '25  24  23   '$ Calcium 8.9 - 10.3 mg/dL 8.4  8.2  7.9   Total Protein 6.5 - 8.1 g/dL 5.5     Total Bilirubin 0.3 - 1.2 mg/dL 0.5     Alkaline Phos 38 - 126 U/L 71     AST 15 - 41 U/L 20     ALT 0 - 44 U/L 15        Imaging studies: No new pertinent imaging studies   Assessment/Plan: 88 y.o. male 3 Days Post-Op s/p I&D of right lower extremity wound, debridement of skin and subcutaneous tissue of right lower extremity for area (18 cm2)   - Plan for repeat CT of the right lower extremity tomorrow (09/23) to reassess; I put in orders for this  -  Wound Care: Pack with moistened 4x4 gauze, covered with dry 4x4 gauze including covering the penrose drain sites, covered with ABD pad, and secure with Kerlix roll and tape. This was changed this morning at bedside.  - Continue IV Abx (Cefepime, Daptomycin); Cx with staph aureus; susceptibilities pending; ID on board   - Pain control prn - Further management per primary service; we will follow    All of the above findings and recommendations were discussed with the patient, and the medical team, and all of patient's questions were answered to his expressed satisfaction.  -- Edison Simon, PA-C [facility] 12/02/2021, 7:15 AM M-F: 7am - 4pm

## 2021-12-02 NOTE — Plan of Care (Signed)

## 2021-12-03 ENCOUNTER — Inpatient Hospital Stay: Payer: PPO

## 2021-12-03 DIAGNOSIS — T148XXA Other injury of unspecified body region, initial encounter: Secondary | ICD-10-CM | POA: Diagnosis not present

## 2021-12-03 DIAGNOSIS — A419 Sepsis, unspecified organism: Secondary | ICD-10-CM | POA: Diagnosis not present

## 2021-12-03 DIAGNOSIS — G309 Alzheimer's disease, unspecified: Secondary | ICD-10-CM | POA: Diagnosis not present

## 2021-12-03 DIAGNOSIS — S81811A Laceration without foreign body, right lower leg, initial encounter: Secondary | ICD-10-CM

## 2021-12-03 DIAGNOSIS — L0390 Cellulitis, unspecified: Secondary | ICD-10-CM | POA: Diagnosis not present

## 2021-12-03 LAB — CULTURE, BLOOD (ROUTINE X 2)
Culture: NO GROWTH
Culture: NO GROWTH
Special Requests: ADEQUATE

## 2021-12-03 LAB — CREATININE, SERUM
Creatinine, Ser: 1.14 mg/dL (ref 0.61–1.24)
GFR, Estimated: >60 mL/min (ref >60)

## 2021-12-03 MED ORDER — HYDROMORPHONE HCL 1 MG/ML IJ SOLN
1.0000 mg | Freq: Once | INTRAMUSCULAR | Status: AC
  Administered 2021-12-03: 1 mg via INTRAVENOUS
  Filled 2021-12-03: qty 1

## 2021-12-03 MED ORDER — LACTULOSE 10 GM/15ML PO SOLN
20.0000 g | Freq: Once | ORAL | Status: AC
  Administered 2021-12-03: 20 g via ORAL
  Filled 2021-12-03: qty 30

## 2021-12-03 MED ORDER — IOHEXOL 300 MG/ML  SOLN
100.0000 mL | Freq: Once | INTRAMUSCULAR | Status: AC | PRN
  Administered 2021-12-03: 100 mL via INTRAVENOUS

## 2021-12-03 NOTE — Discharge Instructions (Signed)
Wound Packing Wound packing usually involves placing a moistened packing material into your wound and then covering it with an outer bandage (dressing). This helps support the healing of deep tissue and tissue under the skin. It also helps prevent bleeding, infection, and further injury. Wounds are packed until deep tissues heal. The time it takes for this to happen is different for everyone. Your health care provider will show you how to pack and dress your wound. Using gloves and a clean technique is important to avoid spreading germs into your wound. Supplies needed: Soap and water. Disposable gloves. Cleansing or wetting solution, such as saline, germ-free (sterile) water, or an antiseptic solution. Clean bowl. Clean packing material, such as gauze, gauze sponges, or rolled gauze. Clean paper towels. Outer dressing. This includes the cover dressing and tape, or a dressing with an adhesive border. Cotton-tipped swabs. Small plastic bag for trash. How to pack your wound Follow your health care provider's instructions on how often you need to change dressings and pack your wound. You will likely be asked to change your dressings 1 time a day. Preparing to change the wound packing If needed, take pain medicine 30 minutes before you pack your wound as told by your health care provider. Preparing the new packing material Washing hands with soap and water.   Clean and disinfect your work surface or countertop. Set a plastic bag on or near your work surface. Wash your hands with soap and water for at least 20 seconds before you change the dressing. If soap and water are not available, use hand sanitizer. Put a clean paper towel on the counter. Put a clean bowl on the towel. Only touch the outside of the bowl when handling it. Pour the cleansing or wetting solution that your health care provider tells you to use into the bowl. Select and cut your packing material to fit the size of your wound.  Avoid using multiple pieces of packing material. Drop it into the bowl. Cut tape strips that you will use to seal the outer dressing, if needed. Put gauze pads for cleansing and cotton-tipped swabs on the clean paper towel. Removing the old packing material and dressing Put on a set of gloves. Gently remove the old dressing and packing material. Make sure to check how the drainage looks or if there is any odor. Clean or rinse (irrigate) the wound. Remove your gloves. Put the removed items, including gloves, into the plastic bag to throw away later. Wash your hands again with soap and water for at least 20 seconds. If soap and water are not available, use hand sanitizer. Applying the new packing material and dressing A cotton-tipped swab being used to guide packing material into a wound.   Put on a new set of gloves. Squeeze the packing material in the bowl to release the extra liquid. The packing material should be moist, but not dripping wet. Gently place the packing material into the wound. Use a cotton-tipped swab to guide it into place, filling all of the space. Do not overpack the wound bed. Dry your gloved fingertips on the paper towel. Open up your outer dressing supplies and put them on a dry part of the paper towel. Keep them from getting wet. Place the outer dressing over the packed wound. Tape the edges of the outer dressing in place. Remove your gloves. Wash your hands again with soap and water for at least 20 seconds. If soap and water are not available, use hand sanitizer.  Put the removed items, including gloves, into the plastic bag to throw away. Clean and disinfect your work surface or countertop. General tips Follow your health care provider's instructions on how much to pack the wound. At first, you may need to pack it more fully to help stop bleeding. As the wound begins to heal inside, you will use less packing material and pack the wound loosely to allow the tissue to  heal slowly from the inside out. Do not take baths, swim, or use a hot tub until your health care provider approves. Ask your health care provider if you may take showers. You may only be allowed to take sponge baths. Keep the dressing clean and dry. Follow any other instructions given by your health care provider on how to aid healing. This may include applying warm or cold compresses, raising (elevating) the affected area, or wearing a compression dressing. Check your wound site every day for signs of infection. Check for: More redness, swelling, or pain. More fluid or blood. Warmth or hardness (induration). Pus or a bad smell. Protect your wound from the sun when you are outside for the first 6 months, or for as long as told by your health care provider. Cover up the scar area or apply sunscreen that has an SPF of at least 47. Keep all follow-up visits. This is important. Contact a health care provider if: Your pain is not controlled with pain medicine. You have more drainage, redness, swelling, or pain at your wound site. You have new rash, warmth, or induration around the wound. You have a fever or chills. Your wound becomes larger or deeper. Get help right away if: The tissue inside your wound changes color from pink to white, yellow, or black. You notice a bad smell or pus coming from the wound site. You are having trouble packing your wound. Your wound is bleeding, and the bleeding does not stop with gentle pressure. These symptoms may represent a serious problem that is an emergency. Do not wait to see if the symptoms will go away. Get medical help right away. Call your local emergency services (911 in the [place] Do not drive yourself to the hospital. Summary Wound packing usually involves placing a moistened packing material into your wound and then covering it with an outer bandage (dressing). Follow your health care provider's instructions on how often you need to change  dressings and pack your wound. You will likely be asked to change dressings 1 to 2 times a day. When packing your wound, it is important to use gloves to avoid spreading germs into the wound. Check your wound site every day for signs of infection. This information is not intended to replace advice given to you by your health care provider. Make sure you discuss any questions you have with your health care provider. Document Revised: 07/06/2020 Document Reviewed: 07/06/2020 Elsevier Patient Education  [place].

## 2021-12-03 NOTE — Progress Notes (Signed)
CC: cellulitis and wound infection Subjective: He has some chronic pain issues and some neuropathy CT pers. Reviewed. resolution of thigh collection, no further collection Objective: Vital signs in last 24 hours: Temp:  [97.8 F (36.6 C)-98.2 F (36.8 C)] 97.8 F (36.6 C) (09/23 0747) Pulse Rate:  [57-65] 64 (09/23 0747) Resp:  [16-18] 17 (09/23 0747) BP: (117-126)/(49-59) 121/59 (09/23 0747) SpO2:  [99 %-100 %] 100 % (09/23 0747) Last BM Date : 12/01/21  Intake/Output from previous day: 09/22 0701 - 09/23 0700 In: 18 [P.O.:60; IV Piggyback:200] Out: 900 [Urine:900] Intake/Output this shift: Total I/O In: 100 [IV Piggyback:100] Out: -   Physical exam: NAd, aler Ext: I removed dressing, wound healing well w good granulation tissue, no evidence of necrotizing infection. THigh soft and minimal erythema   Media Information   Document Information  Photos  right leg wound   12/03/2021 12:16  Attached To:  Hospital Encounter on 11/28/21   Source Information  Mattalyn Anderegg, [place] F, MD  [facility]    Lab Results: CBC  Recent Labs    12/01/21 1020 12/02/21 0433  WBC 8.1 7.4  HGB 11.9* 11.5*  HCT 37.0* 35.4*  PLT 207 245   BMET Recent Labs    12/01/21 0528 12/02/21 0433 12/03/21 0734  NA 137 139  --   K 3.6 3.8  --   CL 107 109  --   CO2 24 25  --   GLUCOSE 111* 125*  --   BUN 26* 28*  --   CREATININE 1.26* 1.13 1.14  CALCIUM 8.2* 8.4*  --    PT/INR No results for input(s): "LABPROT", "INR" in the last 72 hours. ABG No results for input(s): "PHART", "HCO3" in the last 72 hours.  Invalid input(s): "PCO2", "PO2"  Studies/Results: No results found.  Anti-infectives: Anti-infectives (From admission, onward)    Start     Dose/Rate Route Frequency Ordered Stop   12/01/21 2000  DAPTOmycin (CUBICIN) 500 mg in sodium chloride 0.9 % IVPB        6 mg/kg  80 kg 120 mL/hr over 30 Minutes Intravenous Daily 12/01/21 1630     12/01/21 1215  ceFEPIme  (MAXIPIME) 2 g in sodium chloride 0.9 % 100 mL IVPB        2 g 200 mL/hr over 30 Minutes Intravenous Every 12 hours 12/01/21 1118     11/30/21 0000  vancomycin (VANCOREADY) IVPB 1250 mg/250 mL  Status:  Discontinued        1,250 mg 166.7 mL/hr over 90 Minutes Intravenous Every 24 hours 11/29/21 0804 12/01/21 1630   11/29/21 0000  vancomycin (VANCOCIN) IVPB 1000 mg/200 mL premix  Status:  Discontinued        1,000 mg 200 mL/hr over 60 Minutes Intravenous Every 24 hours 11/28/21 1517 11/29/21 0804   11/28/21 1600  ceFEPIme (MAXIPIME) 2 g in sodium chloride 0.9 % 100 mL IVPB  Status:  Discontinued        2 g 200 mL/hr over 30 Minutes Intravenous Every 12 hours 11/28/21 1517 12/01/21 1051   11/28/21 1500  ceFEPIme (MAXIPIME) 2 g in sodium chloride 0.9 % 100 mL IVPB  Status:  Discontinued        2 g 200 mL/hr over 30 Minutes Intravenous  Once 11/28/21 1457 11/28/21 1517   11/28/21 1500  vancomycin (VANCOCIN) IVPB 1000 mg/200 mL premix  Status:  Discontinued        1,000 mg 200 mL/hr over 60 Minutes Intravenous  Once  11/28/21 1457 11/28/21 1517   11/28/21 1300  vancomycin (VANCOCIN) IVPB 1000 mg/200 mL premix        1,000 mg 200 mL/hr over 60 Minutes Intravenous  Once 11/28/21 1252 11/28/21 1418       Assessment/Plan: Cellulitis and open wound, no evidence of undrained collections No need for further surgical intervention Continue daily packing of wound We will follow up as outpt I spent > 35 min in this encounter including coordination of hi care, reviewing images, counseling and performing appropriate documentation  Caroleen Hamman, MD, FACS  12/03/2021

## 2021-12-03 NOTE — Progress Notes (Signed)
  Progress Note   Patient: Theodore [name] VQX:[id number] DOB: November 28, 1929 DOA: 11/28/2021     5 DOS: the patient was seen and examined on 12/03/2021   Brief hospital course:  88 y.o. Caucasian male with medical history significant for COPD, depression, CAD, hypothyroidism, and osteoarthritis, who presented to the ER with acute onset of right leg swelling with associated erythema and pain.  He is diagnosed with right lower extremity cellulitis, placed on vancomycin and cefepime.  I&D was performed by general surgery on 9/19.  9/21. Blood culture no growth, wound culture grew Staphylococcus aureus, cellulitis appears not improving, antibiotic changed to daptomycin.  Assessment and Plan: Severe sepsis secondary to cellulitis Right lower extremity cellulitis with infected wound secondary to MSSA. Had extensive debridement of right lower extremity wound infection, but the redness appears to extend to the thigh.  Repeated CT scan appears to have some fluid collection in the thigh area. Blood cultures so far negative, wound culture positive for Staph aureus.   Patient is seen by ID, antibiotics switched to daptomycin and cefepime. Patient condition seem to be improving, general surgery has scheduled a CT scan again today.  Final culture came back with MSSA, may consider to change to cefazolin if CT scan is not getting worse.    Acute kidney injury secondary to severe sepsis. Condition back to baseline.   Alzheimer disease with depression. Continue home medicines.   COPD Coronary disease. Hypothyroidism. Conditions are stable, continue home regimens.   Moderate protein calorie malnutrition. Continue protein supplement.   Skin rashes. Condition improving after giving topical hydrocortisone.  This is not due to drug allergy.      Subjective:  Patient still require pain medicine due to leg pain.  No shortness of breath.  Physical Exam: Vitals:   12/02/21 1529 12/02/21 1932 12/03/21 0344  12/03/21 0747  BP: (!) 117/56 (!) 126/49 (!) 122/54 (!) 121/59  Pulse: (!) 57 65 60 64  Resp: '16 18 16 17  '$ Temp: 97.8 F (36.6 C) 98.2 F (36.8 C) 98 F (36.7 C) 97.8 F (36.6 C)  TempSrc:  Oral    SpO2: 99% 100% 100% 100%  Weight:      Height:       General exam: Appears calm and comfortable  Respiratory system: Clear to auscultation. Respiratory effort normal. Cardiovascular system: S1 & S2 heard, RRR. No JVD, murmurs, rubs, gallops or clicks. No pedal edema. Gastrointestinal system: Abdomen is nondistended, soft and nontender. No organomegaly or masses felt. Normal bowel sounds heard. Central nervous system: Alert and oriented x2. No focal neurological deficits. Extremities: Left thigh redness continues to improve. Skin: No rashes, lesions or ulcers Psychiatry:  Mood & affect appropriate.   Data Reviewed:  Lab results reviewed.  Family Communication: Wife updated at bedside.  Disposition: Status is: Inpatient Remains inpatient appropriate because: Severity of disease, IV treatment.  Planned Discharge Destination: Skilled nursing facility    Time spent: 35 minutes  Author: Sharen Hones, MD 12/03/2021 1:41 PM  For on call review [email or web address].

## 2021-12-04 DIAGNOSIS — T148XXA Other injury of unspecified body region, initial encounter: Secondary | ICD-10-CM | POA: Diagnosis not present

## 2021-12-04 DIAGNOSIS — L0390 Cellulitis, unspecified: Secondary | ICD-10-CM | POA: Diagnosis not present

## 2021-12-04 DIAGNOSIS — R6520 Severe sepsis without septic shock: Secondary | ICD-10-CM | POA: Diagnosis not present

## 2021-12-04 DIAGNOSIS — A419 Sepsis, unspecified organism: Secondary | ICD-10-CM | POA: Diagnosis not present

## 2021-12-04 LAB — AEROBIC/ANAEROBIC CULTURE W GRAM STAIN (SURGICAL/DEEP WOUND)

## 2021-12-04 NOTE — Progress Notes (Addendum)
  Progress Note   Patient: Theodore [name] [id number]:[id number] DOB: Jul 29, 1929 DOA: 11/28/2021     6 DOS: the patient was seen and examined on 12/04/2021   Brief hospital course:  88 y.o. Caucasian male with medical history significant for COPD, depression, CAD, hypothyroidism, and osteoarthritis, who presented to the ER with acute onset of right leg swelling with associated erythema and pain.  He is diagnosed with right lower extremity cellulitis, placed on vancomycin and cefepime.  I&D was performed by general surgery on 9/19.  9/21. Blood culture no growth, wound culture grew Staphylococcus aureus, cellulitis appears not improving, antibiotic changed to daptomycin.  Assessment and Plan: Severe sepsis secondary to cellulitis Right lower extremity cellulitis with infected wound secondary to MSSA. Had extensive debridement of right lower extremity wound infection, but the redness appears to extend to the thigh.  Repeated CT scan appears to have some fluid collection in the thigh area. Blood cultures so far negative, wound culture positive for Staph aureus.   Patient is seen by ID, antibiotics switched to daptomycin and cefepime. Wound culture came back with MSSA. Patient still has significant pain in the lower extremity below the knee.  Reviewed the CT scan results, fluid collection and the swelling of the thigh is essentially resolved.  But the swelling in the wounds below the knee is getting worse.  Waiting decision about additional debridement from surgery. At this point, I would not change antibiotics until this is getting better.     Acute kidney injury secondary to severe sepsis. Condition back to baseline.   Alzheimer disease with depression. Continue home medicines.   COPD Coronary disease. Hypothyroidism. Conditions are stable, continue home regimens.   Moderate protein calorie malnutrition. Continue protein supplement.   Skin rashes. Condition improving after giving topical  hydrocortisone.  This is not due to drug allergy.        Subjective:  Patient complaining worsening pain in the leg, received 1 dose of 1 mg IV Dilaudid yesterday in addition to oral pain medicine.   Physical Exam: Vitals:   12/03/21 1719 12/03/21 2118 12/04/21 0442 12/04/21 0748  BP: (!) 125/59 (!) 110/39 (!) 155/52 (!) 115/49  Pulse: 70 64 66 60  Resp:  '18 18 14  '$ Temp: 97.9 F (36.6 C) 97.7 F (36.5 C) 98 F (36.7 C) 98.7 F (37.1 C)  TempSrc:      SpO2: 99% 98% 100% 95%  Weight:      Height:       General exam: Appears calm and comfortable  Respiratory system: Clear to auscultation. Respiratory effort normal. Cardiovascular system: S1 & S2 heard, RRR. No JVD, murmurs, rubs, gallops or clicks. No pedal edema. Gastrointestinal system: Abdomen is nondistended, soft and nontender. No organomegaly or masses felt. Normal bowel sounds heard. Central nervous system: Alert and oriented. No focal neurological deficits. Extremities: Left leg swelling, wound.  Left thigh redness essentially resolved. Skin: No rashes, lesions or ulcers Psychiatry: Judgement and insight appear normal. Mood & affect appropriate.   Data Reviewed:  There are no new results to review at this time.  Family Communication: wife updated at bedside 1350  Disposition: Status is: Inpatient Remains inpatient appropriate because: Severity of disease, IV treatment.  Planned Discharge Destination: Skilled nursing facility    Time spent: 35 minutes  Author: Sharen Hones, MD 12/04/2021 1:12 PM  For on call review [email or web address].

## 2021-12-05 DIAGNOSIS — A4901 Methicillin susceptible Staphylococcus aureus infection, unspecified site: Secondary | ICD-10-CM | POA: Diagnosis not present

## 2021-12-05 DIAGNOSIS — L089 Local infection of the skin and subcutaneous tissue, unspecified: Secondary | ICD-10-CM | POA: Diagnosis not present

## 2021-12-05 DIAGNOSIS — A419 Sepsis, unspecified organism: Secondary | ICD-10-CM | POA: Diagnosis not present

## 2021-12-05 DIAGNOSIS — R6520 Severe sepsis without septic shock: Secondary | ICD-10-CM | POA: Diagnosis not present

## 2021-12-05 DIAGNOSIS — T148XXA Other injury of unspecified body region, initial encounter: Secondary | ICD-10-CM | POA: Diagnosis not present

## 2021-12-05 DIAGNOSIS — L0390 Cellulitis, unspecified: Secondary | ICD-10-CM | POA: Diagnosis not present

## 2021-12-05 LAB — COMPREHENSIVE METABOLIC PANEL
ALT: 43 U/L (ref 0–44)
AST: 57 U/L — ABNORMAL HIGH (ref 15–41)
Albumin: 2.6 g/dL — ABNORMAL LOW (ref 3.5–5.0)
Alkaline Phosphatase: 67 U/L (ref 38–126)
Anion gap: 7 (ref 5–15)
BUN: 22 mg/dL (ref 8–23)
CO2: 27 mmol/L (ref 22–32)
Calcium: 8.4 mg/dL — ABNORMAL LOW (ref 8.9–10.3)
Chloride: 104 mmol/L (ref 98–111)
Creatinine, Ser: 1.14 mg/dL (ref 0.61–1.24)
GFR, Estimated: >60 mL/min (ref >60)
Glucose, Bld: 97 mg/dL (ref 70–99)
Potassium: 3.9 mmol/L (ref 3.5–5.1)
Sodium: 138 mmol/L (ref 135–145)
Total Bilirubin: 0.7 mg/dL (ref 0.3–1.2)
Total Protein: 5.9 g/dL — ABNORMAL LOW (ref 6.5–8.1)

## 2021-12-05 LAB — CBC
HCT: 35.6 % — ABNORMAL LOW (ref 39.0–52.0)
Hemoglobin: 11.8 g/dL — ABNORMAL LOW (ref 13.0–17.0)
MCH: 30.9 pg (ref 26.0–34.0)
MCHC: 33.1 g/dL (ref 30.0–36.0)
MCV: 93.2 fL (ref 80.0–100.0)
Platelets: 349 10*3/uL (ref 150–400)
RBC: 3.82 MIL/uL — ABNORMAL LOW (ref 4.22–5.81)
RDW: 12.9 % (ref 11.5–15.5)
WBC: 6.2 10*3/uL (ref 4.0–10.5)
nRBC: 0 % (ref 0.0–0.2)

## 2021-12-05 LAB — CK: Total CK: 278 U/L (ref 49–397)

## 2021-12-05 LAB — MAGNESIUM: Magnesium: 2.1 mg/dL (ref 1.7–2.4)

## 2021-12-05 MED ORDER — POLYETHYLENE GLYCOL 3350 17 G PO PACK
17.0000 g | PACK | Freq: Every day | ORAL | Status: DC
  Administered 2021-12-05 – 2021-12-07 (×3): 17 g via ORAL
  Filled 2021-12-05 (×3): qty 1

## 2021-12-05 MED ORDER — CEFAZOLIN SODIUM-DEXTROSE 2-4 GM/100ML-% IV SOLN
2.0000 g | Freq: Three times a day (TID) | INTRAVENOUS | Status: DC
  Administered 2021-12-05 – 2021-12-07 (×7): 2 g via INTRAVENOUS
  Filled 2021-12-05 (×7): qty 100

## 2021-12-05 NOTE — Care Management Important Message (Signed)
Important Message  Patient Details  Name: Theodore [name] MRN: [id number] Date of Birth: 1930/01/16   Medicare Important Message Given:  Yes     Dannette Barbara 12/05/2021, 11:18 AM

## 2021-12-05 NOTE — Progress Notes (Signed)
Date of Admission:  11/28/2021      ID: Theodore [name] is a 88 y.o. male Principal Problem:   Sepsis due to cellulitis 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 ([place])   Dyslipidemia   GERD without esophagitis   Severe sepsis (HCC)   Laceration of right lower extremity   Protein-calorie malnutrition, moderate (HCC)   AKI (acute kidney injury) ([place])    Subjective: Feeling better than before Walked with PT  Medications:   acidophilus  1 capsule Oral Daily   ascorbic acid  500 mg Oral BID   buPROPion  150 mg Oral Daily   cholecalciferol  2,000 Units Oral Daily   cyanocobalamin  500 mcg Oral Daily   cycloSPORINE  1 drop Both Eyes BID   donepezil  10 mg Oral q morning   enoxaparin (LOVENOX) injection  40 mg Subcutaneous Q24H   ezetimibe  10 mg Oral Daily   feeding supplement  237 mL Oral BID BM   hydrocortisone cream   Topical TID   levothyroxine  100 mcg Oral Q0600   magnesium oxide  400 mg Oral Daily   multivitamin with minerals  1 tablet Oral Daily   polyethylene glycol  17 g Oral Daily   rosuvastatin  10 mg Oral Daily   zinc sulfate  220 mg Oral Daily    Objective: Vital signs in last 24 hours: Temp:  [97.9 F (36.6 C)-98.9 F (37.2 C)] 97.9 F (36.6 C) (09/25 0700) Pulse Rate:  [62-68] 62 (09/25 0700) Resp:  [16-19] 19 (09/25 0700) BP: (126-145)/(58-78) 126/58 (09/25 0700) SpO2:  [93 %-100 %] 97 % (09/25 0700)   PHYSICAL EXAM:  General: Alert, cooperative, some distress as we are going to be changing the dressing  Lungs: Clear to auscultation bilaterally. No Wheezing or Rhonchi. No rales. Heart: Regular rate and rhythm, no murmur, rub or gallop. Abdomen: Soft, non-tender,not distended. Bowel sounds normal. No masses Extremities: Rt leg Surgical wound with surrounding erythema  Penrose drain present Skin: No rashes or lesions. Or bruising Lymph: Cervical, supraclavicular  normal. Neurologic: essential tremors  Lab Results Recent Labs    12/03/21 0734 12/05/21 0726  WBC  --  6.2  HGB  --  11.8*  HCT  --  35.6*  NA  --  138  K  --  3.9  CL  --  104  CO2  --  27  BUN  --  22  CREATININE 1.14 1.14   Liver Panel Recent Labs    12/05/21 0726  PROT 5.9*  ALBUMIN 2.6*  AST 57*  ALT 43  ALKPHOS 67  BILITOT 0.7   Sedimentation Rate No results for input(s): "ESRSEDRATE" in the last 72 hours. C-Reactive Protein No results for input(s): "CRP" in the last 72 hours.  Microbiology:  Studies/Results: CT FEMUR RIGHT W CONTRAST  Result Date: 12/03/2021 CLINICAL DATA:  Soft tissue infection.  Follow-up fluid collections. EXAM: CT OF THE LOWER RIGHT EXTREMITY WITH CONTRAST TECHNIQUE: Multidetector CT imaging of the lower right extremity was performed according to the standard protocol following intravenous contrast administration. RADIATION DOSE REDUCTION: This exam was performed according to the departmental dose-optimization program which includes automated exposure control, adjustment of the mA and/or kV according to patient size and/or use of iterative reconstruction technique. CONTRAST:  180m OMNIPAQUE IOHEXOL 300 MG/ML  SOLN COMPARISON:  CT 12/01/2021 FINDINGS: RIGHT FEMUR: Bones/Joint/Cartilage No acute fracture or dislocation. No erosion  or periosteal elevation. Similar degenerative changes of the right hip. No appreciable right hip joint effusion. No lytic or sclerotic bone lesion. Ligaments Suboptimally assessed by CT. Muscles and Tendons Similar degree of thickening in the region of the distal quadriceps tendon. No intramuscular fluid collections. Soft tissues Complete interval resolution of the previously described fluid within the medial compartment of the thigh extending along the course of the right gracilis muscle. Subcutaneous edema and ill-defined fluid along the medial and lateral aspects of the right thigh is similar to the previous study. No  organized or rim enhancing fluid collection. No soft tissue gas. No right inguinal lymphadenopathy. Small bilateral hydroceles. Urinary bladder appears circumferentially thickened. RIGHT TIBIA-FIBULA: Bones/Joint/Cartilage No acute fracture. No dislocation. No site of erosion or periosteal elevation. The anteromedial aspect of the mid tibial diaphysis is again seen exposed at site of soft tissue debridement within the lower leg. Grossly intact right total knee arthroplasty hardware. Ligaments Suboptimally assessed by CT. Muscles and Tendons No acute musculotendinous abnormality by CT. No intramuscular fluid collection is seen. Soft tissues Wide soft tissue debridement site along the anteromedial aspect of the mid tibia with packing material. The degree of soft tissue edema of the lower leg has increased from prior. Surgical Penrose drain is again seen within the soft tissues medially. No soft tissue gas. No organized or rim enhancing fluid collections of the lower leg. RIGHT FOOT: Bones/Joint/Cartilage No acute fracture or dislocation. No site of erosion or periosteal elevation. Similar degree of degenerative changes of the foot. No significant joint effusion is seen. Ligaments Suboptimally assessed by CT. Muscles and Tendons No acute musculotendinous abnormality by CT. No large tenosynovial fluid collection. Soft tissues Circumferential edema of the ankle and dorsal foot. No organized or rim enhancing fluid collection. No soft tissue gas. IMPRESSION: 1. Complete interval resolution of previously described fluid within the medial compartment of the right thigh seen along the course of the right gracilis muscle. 2. Increased soft tissue edema of the right lower leg, nonspecific but could reflect worsening cellulitis. No evidence of abscess or soft tissue gas. 3. No acute osseous abnormality of the right lower extremity. 4. Circumferentially thickened appearance of the urinary bladder. Correlate with urinalysis.  Electronically Signed   By: Davina Poke D.O.   On: 12/03/2021 17:22   CT TIBIA FIBULA RIGHT W CONTRAST  Result Date: 12/03/2021 CLINICAL DATA:  Soft tissue infection.  Follow-up fluid collections. EXAM: CT OF THE LOWER RIGHT EXTREMITY WITH CONTRAST TECHNIQUE: Multidetector CT imaging of the lower right extremity was performed according to the standard protocol following intravenous contrast administration. RADIATION DOSE REDUCTION: This exam was performed according to the departmental dose-optimization program which includes automated exposure control, adjustment of the mA and/or kV according to patient size and/or use of iterative reconstruction technique. CONTRAST:  133m OMNIPAQUE IOHEXOL 300 MG/ML  SOLN COMPARISON:  CT 12/01/2021 FINDINGS: RIGHT FEMUR: Bones/Joint/Cartilage No acute fracture or dislocation. No erosion or periosteal elevation. Similar degenerative changes of the right hip. No appreciable right hip joint effusion. No lytic or sclerotic bone lesion. Ligaments Suboptimally assessed by CT. Muscles and Tendons Similar degree of thickening in the region of the distal quadriceps tendon. No intramuscular fluid collections. Soft tissues Complete interval resolution of the previously described fluid within the medial compartment of the thigh extending along the course of the right gracilis muscle. Subcutaneous edema and ill-defined fluid along the medial and lateral aspects of the right thigh is similar to the previous study. No organized or rim  enhancing fluid collection. No soft tissue gas. No right inguinal lymphadenopathy. Small bilateral hydroceles. Urinary bladder appears circumferentially thickened. RIGHT TIBIA-FIBULA: Bones/Joint/Cartilage No acute fracture. No dislocation. No site of erosion or periosteal elevation. The anteromedial aspect of the mid tibial diaphysis is again seen exposed at site of soft tissue debridement within the lower leg. Grossly intact right total knee arthroplasty  hardware. Ligaments Suboptimally assessed by CT. Muscles and Tendons No acute musculotendinous abnormality by CT. No intramuscular fluid collection is seen. Soft tissues Wide soft tissue debridement site along the anteromedial aspect of the mid tibia with packing material. The degree of soft tissue edema of the lower leg has increased from prior. Surgical Penrose drain is again seen within the soft tissues medially. No soft tissue gas. No organized or rim enhancing fluid collections of the lower leg. RIGHT FOOT: Bones/Joint/Cartilage No acute fracture or dislocation. No site of erosion or periosteal elevation. Similar degree of degenerative changes of the foot. No significant joint effusion is seen. Ligaments Suboptimally assessed by CT. Muscles and Tendons No acute musculotendinous abnormality by CT. No large tenosynovial fluid collection. Soft tissues Circumferential edema of the ankle and dorsal foot. No organized or rim enhancing fluid collection. No soft tissue gas. IMPRESSION: 1. Complete interval resolution of previously described fluid within the medial compartment of the right thigh seen along the course of the right gracilis muscle. 2. Increased soft tissue edema of the right lower leg, nonspecific but could reflect worsening cellulitis. No evidence of abscess or soft tissue gas. 3. No acute osseous abnormality of the right lower extremity. 4. Circumferentially thickened appearance of the urinary bladder. Correlate with urinalysis. Electronically Signed   By: Davina Poke D.O.   On: 12/03/2021 17:22   CT FOOT RIGHT W CONTRAST  Result Date: 12/03/2021 CLINICAL DATA:  Soft tissue infection.  Follow-up fluid collections. EXAM: CT OF THE LOWER RIGHT EXTREMITY WITH CONTRAST TECHNIQUE: Multidetector CT imaging of the lower right extremity was performed according to the standard protocol following intravenous contrast administration. RADIATION DOSE REDUCTION: This exam was performed according to the  departmental dose-optimization program which includes automated exposure control, adjustment of the mA and/or kV according to patient size and/or use of iterative reconstruction technique. CONTRAST:  141m OMNIPAQUE IOHEXOL 300 MG/ML  SOLN COMPARISON:  CT 12/01/2021 FINDINGS: RIGHT FEMUR: Bones/Joint/Cartilage No acute fracture or dislocation. No erosion or periosteal elevation. Similar degenerative changes of the right hip. No appreciable right hip joint effusion. No lytic or sclerotic bone lesion. Ligaments Suboptimally assessed by CT. Muscles and Tendons Similar degree of thickening in the region of the distal quadriceps tendon. No intramuscular fluid collections. Soft tissues Complete interval resolution of the previously described fluid within the medial compartment of the thigh extending along the course of the right gracilis muscle. Subcutaneous edema and ill-defined fluid along the medial and lateral aspects of the right thigh is similar to the previous study. No organized or rim enhancing fluid collection. No soft tissue gas. No right inguinal lymphadenopathy. Small bilateral hydroceles. Urinary bladder appears circumferentially thickened. RIGHT TIBIA-FIBULA: Bones/Joint/Cartilage No acute fracture. No dislocation. No site of erosion or periosteal elevation. The anteromedial aspect of the mid tibial diaphysis is again seen exposed at site of soft tissue debridement within the lower leg. Grossly intact right total knee arthroplasty hardware. Ligaments Suboptimally assessed by CT. Muscles and Tendons No acute musculotendinous abnormality by CT. No intramuscular fluid collection is seen. Soft tissues Wide soft tissue debridement site along the anteromedial aspect of the mid tibia  with packing material. The degree of soft tissue edema of the lower leg has increased from prior. Surgical Penrose drain is again seen within the soft tissues medially. No soft tissue gas. No organized or rim enhancing fluid  collections of the lower leg. RIGHT FOOT: Bones/Joint/Cartilage No acute fracture or dislocation. No site of erosion or periosteal elevation. Similar degree of degenerative changes of the foot. No significant joint effusion is seen. Ligaments Suboptimally assessed by CT. Muscles and Tendons No acute musculotendinous abnormality by CT. No large tenosynovial fluid collection. Soft tissues Circumferential edema of the ankle and dorsal foot. No organized or rim enhancing fluid collection. No soft tissue gas. IMPRESSION: 1. Complete interval resolution of previously described fluid within the medial compartment of the right thigh seen along the course of the right gracilis muscle. 2. Increased soft tissue edema of the right lower leg, nonspecific but could reflect worsening cellulitis. No evidence of abscess or soft tissue gas. 3. No acute osseous abnormality of the right lower extremity. 4. Circumferentially thickened appearance of the urinary bladder. Correlate with urinalysis. Electronically Signed   By: Davina Poke D.O.   On: 12/03/2021 17:22     Assessment/Plan: Rt shin wound following trauma - infected- necrotic  s/p debridement Culture MSSA Pt  on dapto and cefepime and leucocytosis has resolved after debridement As no MRSA and gram neg rods Dc the current broad spectrum antibiotics and change to cefazolin to treat Staph aureus. Pt has improved and the collection thigh has resolved Continue IV antibiotic as long as he is in the hospital. On discharge would do kelfex 1 gram Q6 equivalent adjusted to crcl Until 12/15/21. May need more if not healed by then Pt seen with Dr.Zhang'  need to discuss with Surgeon to remove penrose drain before discharge Keep the leg elevated while in bed Discussed the management with patient and his wife

## 2021-12-05 NOTE — Progress Notes (Signed)
Physical Therapy Treatment Patient Details Name: Theodore [name] MRN: [id number] DOB: 10/31/1929 Today's Date: 12/05/2021   History of Present Illness Theodore [name] is a 88 y.o. Caucasian male with medical history significant for COPD, depression, CAD, hypothyroidism, and osteoarthritis, who presented to the ER with acute onset of right leg swelling with associated erythema and pain.  Patient fell on the edge of a plywood on Thursday.  Was seen in urgent care on Friday and had 13 stitches for laceration repair.  He was not given any antibiotics per his report.  Since then his right leg has been experiencing worsening of swelling with associated tenderness, erythema and pain.  Pt temperature was 99-100 per his wife. Pt now s/p Incision and Drainage of right lower extremity wound, debridement of skin and subcutaneous tissue of right lower extremity for area 6 x 3 cm = 18 cm2.    PT Comments    Pt presents to PT in bed and agreeable to participate in therapy services. Pt was pleasant and motivated to participate during the session and put forth good effort throughout. Pt able to perform bed mobility and transfers with no physical assistance required. During amb, required frequent cueing for sequencing and RW management, but required no physical assist and had no LOB. Participated in seated and supine therapeutic exercises. Would benefit from skilled PT at SNF to address above deficits in strength, functional mobility, balance, and activity tolerance to promote optimal return to PLOF.    Recommendations for follow up therapy are one component of a multi-disciplinary discharge planning process, led by the attending physician.  Recommendations may be updated based on patient status, additional functional criteria and insurance authorization.  Follow Up Recommendations  Skilled nursing-short term rehab (<3 hours/day) Can patient physically be transported by private vehicle: Yes   Assistance Recommended at  Discharge  Frequent/constant supervision  Patient can return home with the following A little help with walking and/or transfers;A little help with bathing/dressing/bathroom;Assistance with cooking/housework;Assist for transportation;Direct supervision/assist for medications management   Equipment Recommendations  None recommended by PT    Recommendations for Other Services       Precautions / Restrictions Precautions Precautions: Fall Restrictions Weight Bearing Restrictions: Yes RLE Weight Bearing: Weight bearing as tolerated     Mobility  Bed Mobility Overal bed mobility: Needs Assistance Bed Mobility: Supine to Sit, Sit to Supine     Supine to sit: Supervision Sit to supine: Supervision        Transfers Overall transfer level: Needs assistance Equipment used: Rolling walker (2 wheels) Transfers: Sit to/from Stand Sit to Stand: Min guard           General transfer comment: decreased Wt bearing ability on  RLE,    Ambulation/Gait Ambulation/Gait assistance: Min guard Gait Distance (Feet): 30 Feet Assistive device: Rolling walker (2 wheels) Gait Pattern/deviations: Step-to pattern, Antalgic, Decreased stance time - right, Decreased step length - left Gait velocity: decr     General Gait Details: slow, effortful steps, required frequent cueing for sequencing and RW management    Stairs             Wheelchair Mobility    Modified Rankin (Stroke Patients Only)       Balance Overall balance assessment: Needs assistance Sitting-balance support: No upper extremity supported, Feet unsupported Sitting balance-Leahy Scale: Good     Standing balance support: Bilateral upper extremity supported, Reliant on assistive device for balance, During functional activity Standing balance-Leahy Scale: Fair  Cognition Arousal/Alertness: Awake/alert Behavior During Therapy: WFL for tasks assessed/performed Overall  Cognitive Status: History of cognitive impairments - at baseline                                          Exercises Total Joint Exercises Ankle Circles/Pumps: AROM, Both, 10 reps Quad Sets: Strengthening, Both, 10 reps Long Arc Quad: Strengthening, Both, 10 reps    General Comments        Pertinent Vitals/Pain Pain Assessment Pain Assessment: PAINAD Breathing: normal Negative Vocalization: occasional moan/groan, low speech, negative/disapproving quality Facial Expression: facial grimacing Body Language: relaxed Consolability: no need to console PAINAD Score: 3 Pain Location: R LE Pain Descriptors / Indicators: Other (Comment) ("zapping") Pain Intervention(s): Limited activity within patient's tolerance, Monitored during session    Home Living                          Prior Function            PT Goals (current goals can now be found in the care plan section) Acute Rehab PT Goals Patient Stated Goal: " Get stronger" PT Goal Formulation: With patient/family Time For Goal Achievement: 12/14/21 Potential to Achieve Goals: Good Progress towards PT goals: Progressing toward goals    Frequency    Min 2X/week      PT Plan Current plan remains appropriate    Co-evaluation              AM-PAC PT "6 Clicks" Mobility   Outcome Measure  Help needed turning from your back to your side while in a flat bed without using bedrails?: A Little Help needed moving from lying on your back to sitting on the side of a flat bed without using bedrails?: None Help needed moving to and from a bed to a chair (including a wheelchair)?: A Little Help needed standing up from a chair using your arms (e.g., wheelchair or bedside chair)?: A Little Help needed to walk in hospital room?: A Little Help needed climbing 3-5 steps with a railing? : A Lot 6 Click Score: 18    End of Session Equipment Utilized During Treatment: Gait belt Activity Tolerance:  Patient tolerated treatment well Patient left: in bed;with call bell/phone within reach;with bed alarm set   PT Visit Diagnosis: Unsteadiness on feet (R26.81);Other abnormalities of gait and mobility (R26.89);Repeated falls (R29.6);Difficulty in walking, not elsewhere classified (R26.2);Pain Pain - Right/Left: Right Pain - part of body: Leg     Time: 9038-3338 PT Time Calculation (min) (ACUTE ONLY): 27 min  Charges:                        Glenice Laine MPH, SPT 12/05/21, 12:16 PM

## 2021-12-05 NOTE — Progress Notes (Addendum)
  Progress Note   Patient: Theodore [name] PFX:[id number] DOB: [birth year] DOA: 11/28/2021     7 DOS: the patient was seen and examined on 12/05/2021   Brief hospital course:  88 y.o. Caucasian male with medical history significant for COPD, depression, CAD, hypothyroidism, and osteoarthritis, who presented to the ER with acute onset of right leg swelling with associated erythema and pain.  He is diagnosed with right lower extremity cellulitis, placed on vancomycin and cefepime.  I&D was performed by general surgery on 9/19.  9/21. Blood culture no growth, wound culture grew Staphylococcus aureus, cellulitis appears not improving, antibiotic changed to daptomycin.  Assessment and Plan: Severe sepsis secondary to cellulitis Right lower extremity cellulitis with infected wound secondary to MSSA. Had extensive debridement of right lower extremity wound infection, but the redness appears to extend to the thigh.  Repeated CT scan appears to have some fluid collection in the thigh area. Blood cultures so far negative, wound culture positive for Staph aureus.   Patient is seen by ID, antibiotics switched to daptomycin and cefepime. Wound culture came back with MSSA. Repeated CT scan 9/23, fluid collection and the swelling of the thigh is essentially resolved.  But the swelling in the wounds below the knee is getting worse.   Patient will be seen again by general surgery today, patient will be also followed by infectious disease to make adjustment to antibiotics today.   Acute kidney injury secondary to severe sepsis. Condition back to baseline.   Alzheimer disease with depression. Continue home medicines.   COPD Coronary disease. Hypothyroidism. Conditions are stable, continue home regimens.   Moderate protein calorie malnutrition. Continue protein supplement.   Skin rashes. Condition improving after giving topical hydrocortisone.  This is not due to drug allergy.        Subjective:   Patient still have significant pain in the right lower extremity.  Slept better, no fever or chills.  Physical Exam: Vitals:   12/04/21 1634 12/04/21 2000 12/05/21 0410 12/05/21 0700  BP: 129/64 131/67 (!) 145/78 (!) 126/58  Pulse: 68 66 67 62  Resp: 16   19  Temp: 98.8 F (37.1 C) 98.9 F (37.2 C)  97.9 F (36.6 C)  TempSrc:  Oral  Oral  SpO2: 99% 93% 100% 97%  Weight:      Height:       General exam: Appears calm and comfortable  Respiratory system: Clear to auscultation. Respiratory effort normal. Cardiovascular system: S1 & S2 heard, RRR. No JVD, murmurs, rubs, gallops or clicks. No pedal edema. Gastrointestinal system: Abdomen is nondistended, soft and nontender. No organomegaly or masses felt. Normal bowel sounds heard. Central nervous system: Alert and oriented x2. No focal neurological deficits. Extremities: Symmetric 5 x 5 power. Skin: No rashes, lesions or ulcers Psychiatry:  Mood & affect appropriate.   Data Reviewed:  Lab results reviewed.  Family Communication: wife updated at bedside  Disposition: Status is: Inpatient Remains inpatient appropriate because: Severity of disease, IV treatment.  Pending nursing placement.  Planned Discharge Destination: Skilled nursing facility    Time spent: 35 minutes  Author: Sharen Hones, MD 12/05/2021 12:05 PM  For on call review [email or web address].

## 2021-12-05 NOTE — Plan of Care (Signed)
  Problem: Respiratory: Goal: Ability to maintain adequate ventilation will improve Outcome: Progressing   Problem: Education: Goal: Knowledge of General Education information will improve Description: Including pain rating scale, medication(s)/side effects and non-pharmacologic comfort measures Outcome: Progressing   Problem: Health Behavior/Discharge Planning: Goal: Ability to manage health-related needs will improve Outcome: Progressing   

## 2021-12-05 NOTE — Progress Notes (Signed)
Morphine given IV at time of dressing change this am.

## 2021-12-05 NOTE — Progress Notes (Signed)
Nutrition Follow-up  DOCUMENTATION CODES:   Non-severe (moderate) malnutrition in context of chronic illness  INTERVENTION:   -Continue Ensure Enlive po BID, each supplement provides 350 kcal and 20 grams of protein -Continue MVI with minerals daily -Continue 500 mg vitamin C BID -Continue 220 mg zinc sulfate daily x 14 days   NUTRITION DIAGNOSIS:   Moderate Malnutrition related to chronic illness (COPD) as evidenced by mild fat depletion, moderate fat depletion, mild muscle depletion, moderate muscle depletion.  Ongoing  GOAL:   Patient will meet greater than or equal to 90% of their needs  Progressing   MONITOR:   PO intake, Supplement acceptance, Diet advancement  REASON FOR ASSESSMENT:   Malnutrition Screening Tool    ASSESSMENT:   Pt with medical history significant for COPD, depression, CAD, hypothyroidism, and osteoarthritis, who presented to the with acute onset of right leg swelling with associated erythema and pain.  Patient fell on the edge of a plywood on Thursday.  Was seen in urgent care on Friday and had 13 stitches for laceration repair.  9/19- s/p Incision and Drainage of right lower extremity wound, debridement of skin and subcutaneous tissue of right lower extremity for area 6 x 3 cm = 18 cm2 9/21- per MD< no evidence of necrotizing fascitis   Reviewed I/O's: -480 ml x 24 hours and -322 ml since admission  UOP: 900 ml x 24 hours   Pt unavailable at time of visit. Attempted to speak with pt via call to hospital room phone, however, unable to reach.  Pt on a regular diet. Noted meal completions 40-100%. Pt has been drinking Ensure supplements.   Per general surgery notes, no evidence of undrained collection. Plan for daily packing of wound.   Plan SNF placement once medically stable.   Medications reviewed and include vitamin C, vitamin D3, magnesium oxide, miralax, and zinc sulfate.   Labs reviewed: CBGS: 80.   Diet Order:   Diet Order              Diet regular Room service appropriate? Yes; Fluid consistency: Thin  Diet effective now                   EDUCATION NEEDS:   Education needs have been addressed  Skin:  Skin Assessment: Reviewed RN Assessment  Last BM:  11/28/21 (type 2)  Height:   Ht Readings from Last 1 Encounters:  11/29/21 '6\' 2"'$  (1.88 m)    Weight:   Wt Readings from Last 1 Encounters:  11/29/21 80 kg    Ideal Body Weight:  86.4 kg  BMI:  Body mass index is 22.64 kg/m.  Estimated Nutritional Needs:   Kcal:  2000-2200  Protein:  105-120 grams  Fluid:  > 2 L    Loistine Chance, RD, LDN, [place] Registered Dietitian II Certified Diabetes Care and Education Specialist Please refer to [facility] for RD and/or RD on-call/weekend/after hours pager

## 2021-12-06 ENCOUNTER — Encounter: Payer: PPO | Admitting: Speech Pathology

## 2021-12-06 DIAGNOSIS — A419 Sepsis, unspecified organism: Secondary | ICD-10-CM | POA: Diagnosis not present

## 2021-12-06 DIAGNOSIS — T148XXA Other injury of unspecified body region, initial encounter: Secondary | ICD-10-CM | POA: Diagnosis not present

## 2021-12-06 DIAGNOSIS — L0390 Cellulitis, unspecified: Secondary | ICD-10-CM | POA: Diagnosis not present

## 2021-12-06 DIAGNOSIS — R6520 Severe sepsis without septic shock: Secondary | ICD-10-CM | POA: Diagnosis not present

## 2021-12-06 NOTE — Progress Notes (Addendum)
Brief Note Patient seen this morning Still with pain to RLE; improving Erythema improved; no longer tracking up medial right leg Cx with MSSA; ID on board and reviewed Abx recommendations  Exam:  On exam, right anterior shin is still erythematous but improved and this is no longer tracking up medial left leg as seen previously, wound without evidence of infection or necrosis, penrose removed. Dressing replaced  Plan  - Penrose removed; dressing replaced - Continue wound care: Pack with moistened 4x4 gauze, covered with dry 4x4 gauze, cover with ABD pad, and secure with Kerlix roll and tape - Abx per ID - Nothing further from surgical perspective - Further management from primary service; discharge once medical stable  -- Edison Simon, PA-C [facility] 12/06/2021, 9:17 AM M-F: 7am - 4pm

## 2021-12-06 NOTE — Progress Notes (Addendum)
[employer] auth started. Auth pending for [facility] and [employer] placed to [facility] to advise of possible admission today pending auth. No answer. Left a message for Seth Bake in [employer]

## 2021-12-06 NOTE — Progress Notes (Signed)
Mobility Specialist - Progress Note   12/06/21 1037  Mobility  Activity Ambulated with assistance to bathroom;Stood at bedside;Dangled on edge of bed  Level of Assistance Minimal assist, patient does 75% or more  Assistive Device Front wheel walker  Distance Ambulated (ft) 8 ft  Activity Response Tolerated well  $Mobility charge 1 Mobility   Pt sitting upright in bed on RA upon arrival. Pt scoots to EOB indep with extra time. PT STS with MinA and ambulates to and from restroom CGA. Pt needs constant VC for safety and extra time for tasks. Pt has no LOB noted during session. Pt returns to bed with needs in reach, bed alarm on, and visitors in room.   Gretchen Short  Mobility Specialist  12/06/21 10:39 AM

## 2021-12-06 NOTE — Progress Notes (Signed)
  Progress Note   Patient: JEANNE TERRANCE FBP:[id number] DOB: 01-21-1930 DOA: 11/28/2021     8 DOS: the patient was seen and examined on 12/06/2021   Brief hospital course:  88 y.o. Caucasian male with medical history significant for COPD, depression, CAD, hypothyroidism, and osteoarthritis, who presented to the ER with acute onset of right leg swelling with associated erythema and pain.  He is diagnosed with right lower extremity cellulitis, placed on vancomycin and cefepime.  I&D was performed by general surgery on 9/19.  9/21. Blood culture no growth, wound culture grew Staphylococcus aureus, cellulitis appears not improving, antibiotic changed to daptomycin. 9/26.  Condition improved, wound has MSSA.  Antibiotics switched to cefazolin 9/25.  Pending nursing home placement.  Need to change to Keflex 1000 mg every 6 hours, last dose 12/15/2021.  Assessment and Plan: Severe sepsis secondary to cellulitis Right lower extremity cellulitis with infected wound secondary to MSSA. Had extensive debridement of right lower extremity wound infection, but the redness appears to extend to the thigh.  Repeated CT scan appears to have some fluid collection in the thigh area. Blood cultures so far negative, wound culture positive for Staph aureus.   Patient is seen by ID, antibiotics switched to daptomycin and cefepime. Wound culture came back with MSSA. Repeated CT scan 9/23, fluid collection and the swelling of the thigh is essentially resolved.  But the swelling in the wounds below the knee is getting worse.   Examined patient wound with Dr. Ramon Dredge 9/25.  Condition much improved.  Antibiotic switched to cefazolin yesterday.  Continue oral Keflex at discharge as above.   Acute kidney injury secondary to severe sepsis. Condition back to baseline.   Alzheimer disease with depression. Continue home medicines.   COPD Coronary disease. Hypothyroidism. Conditions are stable, continue home regimens.    Moderate protein calorie malnutrition. Continue protein supplement.   Skin rashes. Condition improving after giving topical hydrocortisone.  This is not due to drug allergy.      Subjective:  Patient doing much better, still has a pain require oral pain medicine.  Otherwise doing well.  Physical Exam: Vitals:   12/05/21 1621 12/05/21 1741 12/05/21 2232 12/06/21 0751  BP: (!) 102/36 (!) 118/57 (!) 138/49 130/64  Pulse: (!) 58 60 92 (!) 59  Resp: 18  (!) 24 18  Temp: 98 F (36.7 C)  98.5 F (36.9 C) 97.7 F (36.5 C)  TempSrc:      SpO2: 95%  99% 98%  Weight:      Height:       General exam: Appears calm and comfortable  Respiratory system: Clear to auscultation. Respiratory effort normal. Cardiovascular system: S1 & S2 heard, RRR. No JVD, murmurs, rubs, gallops or clicks. No pedal edema. Gastrointestinal system: Abdomen is nondistended, soft and nontender. No organomegaly or masses felt. Normal bowel sounds heard. Central nervous system: Alert and oriented x2. No focal neurological deficits. Extremities: Symmetric 5 x 5 power. Skin: No rashes, lesions or ulcers Psychiatry: Mood & affect appropriate.   Data Reviewed:  There are no new results to review at this time.  Family Communication:   Disposition: Status is: Inpatient Remains inpatient appropriate because: Unsafe discharge, pending nursing placement.  Planned Discharge Destination: Skilled nursing facility    Time spent: 28 minutes  Author: Sharen Hones, MD 12/06/2021 3:11 PM  For on call review [email or web address].

## 2021-12-07 DIAGNOSIS — M25552 Pain in left hip: Secondary | ICD-10-CM | POA: Diagnosis not present

## 2021-12-07 DIAGNOSIS — L089 Local infection of the skin and subcutaneous tissue, unspecified: Secondary | ICD-10-CM | POA: Diagnosis not present

## 2021-12-07 DIAGNOSIS — S81811D Laceration without foreign body, right lower leg, subsequent encounter: Secondary | ICD-10-CM | POA: Diagnosis not present

## 2021-12-07 DIAGNOSIS — E440 Moderate protein-calorie malnutrition: Secondary | ICD-10-CM | POA: Diagnosis not present

## 2021-12-07 DIAGNOSIS — F0283 Dementia in other diseases classified elsewhere, unspecified severity, with mood disturbance: Secondary | ICD-10-CM | POA: Diagnosis not present

## 2021-12-07 DIAGNOSIS — N1832 Chronic kidney disease, stage 3b: Secondary | ICD-10-CM | POA: Diagnosis not present

## 2021-12-07 DIAGNOSIS — B9562 Methicillin resistant Staphylococcus aureus infection as the cause of diseases classified elsewhere: Secondary | ICD-10-CM | POA: Diagnosis not present

## 2021-12-07 DIAGNOSIS — G3184 Mild cognitive impairment, so stated: Secondary | ICD-10-CM | POA: Diagnosis not present

## 2021-12-07 DIAGNOSIS — M5442 Lumbago with sciatica, left side: Secondary | ICD-10-CM | POA: Diagnosis not present

## 2021-12-07 DIAGNOSIS — R262 Difficulty in walking, not elsewhere classified: Secondary | ICD-10-CM | POA: Diagnosis not present

## 2021-12-07 DIAGNOSIS — G309 Alzheimer's disease, unspecified: Secondary | ICD-10-CM | POA: Diagnosis not present

## 2021-12-07 DIAGNOSIS — R2681 Unsteadiness on feet: Secondary | ICD-10-CM | POA: Diagnosis not present

## 2021-12-07 DIAGNOSIS — M79652 Pain in left thigh: Secondary | ICD-10-CM | POA: Diagnosis not present

## 2021-12-07 DIAGNOSIS — G8929 Other chronic pain: Secondary | ICD-10-CM | POA: Diagnosis not present

## 2021-12-07 DIAGNOSIS — I10 Essential (primary) hypertension: Secondary | ICD-10-CM | POA: Diagnosis not present

## 2021-12-07 DIAGNOSIS — E039 Hypothyroidism, unspecified: Secondary | ICD-10-CM | POA: Diagnosis not present

## 2021-12-07 DIAGNOSIS — M9904 Segmental and somatic dysfunction of sacral region: Secondary | ICD-10-CM | POA: Diagnosis not present

## 2021-12-07 DIAGNOSIS — L03115 Cellulitis of right lower limb: Secondary | ICD-10-CM | POA: Diagnosis not present

## 2021-12-07 DIAGNOSIS — J449 Chronic obstructive pulmonary disease, unspecified: Secondary | ICD-10-CM | POA: Diagnosis not present

## 2021-12-07 DIAGNOSIS — E038 Other specified hypothyroidism: Secondary | ICD-10-CM | POA: Diagnosis not present

## 2021-12-07 DIAGNOSIS — E559 Vitamin D deficiency, unspecified: Secondary | ICD-10-CM | POA: Diagnosis not present

## 2021-12-07 DIAGNOSIS — I2510 Atherosclerotic heart disease of native coronary artery without angina pectoris: Secondary | ICD-10-CM | POA: Diagnosis not present

## 2021-12-07 DIAGNOSIS — N183 Chronic kidney disease, stage 3 unspecified: Secondary | ICD-10-CM | POA: Diagnosis not present

## 2021-12-07 DIAGNOSIS — E46 Unspecified protein-calorie malnutrition: Secondary | ICD-10-CM | POA: Diagnosis not present

## 2021-12-07 DIAGNOSIS — F5101 Primary insomnia: Secondary | ICD-10-CM | POA: Diagnosis not present

## 2021-12-07 DIAGNOSIS — I739 Peripheral vascular disease, unspecified: Secondary | ICD-10-CM | POA: Diagnosis not present

## 2021-12-07 DIAGNOSIS — R0609 Other forms of dyspnea: Secondary | ICD-10-CM | POA: Diagnosis not present

## 2021-12-07 DIAGNOSIS — A4901 Methicillin susceptible Staphylococcus aureus infection, unspecified site: Secondary | ICD-10-CM | POA: Diagnosis not present

## 2021-12-07 DIAGNOSIS — L240 Irritant contact dermatitis due to detergents: Secondary | ICD-10-CM | POA: Diagnosis not present

## 2021-12-07 DIAGNOSIS — Z09 Encounter for follow-up examination after completed treatment for conditions other than malignant neoplasm: Secondary | ICD-10-CM | POA: Diagnosis not present

## 2021-12-07 DIAGNOSIS — M9903 Segmental and somatic dysfunction of lumbar region: Secondary | ICD-10-CM | POA: Diagnosis not present

## 2021-12-07 DIAGNOSIS — F325 Major depressive disorder, single episode, in full remission: Secondary | ICD-10-CM | POA: Diagnosis not present

## 2021-12-07 DIAGNOSIS — R4189 Other symptoms and signs involving cognitive functions and awareness: Secondary | ICD-10-CM | POA: Diagnosis not present

## 2021-12-07 DIAGNOSIS — M6281 Muscle weakness (generalized): Secondary | ICD-10-CM | POA: Diagnosis not present

## 2021-12-07 DIAGNOSIS — S81801D Unspecified open wound, right lower leg, subsequent encounter: Secondary | ICD-10-CM | POA: Diagnosis not present

## 2021-12-07 DIAGNOSIS — E034 Atrophy of thyroid (acquired): Secondary | ICD-10-CM

## 2021-12-07 DIAGNOSIS — Z741 Need for assistance with personal care: Secondary | ICD-10-CM | POA: Diagnosis not present

## 2021-12-07 DIAGNOSIS — A419 Sepsis, unspecified organism: Secondary | ICD-10-CM | POA: Diagnosis not present

## 2021-12-07 DIAGNOSIS — M542 Cervicalgia: Secondary | ICD-10-CM | POA: Diagnosis not present

## 2021-12-07 DIAGNOSIS — F418 Other specified anxiety disorders: Secondary | ICD-10-CM | POA: Diagnosis not present

## 2021-12-07 DIAGNOSIS — T8149XD Infection following a procedure, other surgical site, subsequent encounter: Secondary | ICD-10-CM | POA: Diagnosis not present

## 2021-12-07 DIAGNOSIS — Z23 Encounter for immunization: Secondary | ICD-10-CM | POA: Diagnosis not present

## 2021-12-07 DIAGNOSIS — Y92018 Other place in single-family (private) house as the place of occurrence of the external cause: Secondary | ICD-10-CM | POA: Diagnosis not present

## 2021-12-07 DIAGNOSIS — S81811A Laceration without foreign body, right lower leg, initial encounter: Secondary | ICD-10-CM | POA: Diagnosis not present

## 2021-12-07 DIAGNOSIS — E785 Hyperlipidemia, unspecified: Secondary | ICD-10-CM | POA: Diagnosis not present

## 2021-12-07 DIAGNOSIS — M9901 Segmental and somatic dysfunction of cervical region: Secondary | ICD-10-CM | POA: Diagnosis not present

## 2021-12-07 DIAGNOSIS — W19XXXA Unspecified fall, initial encounter: Secondary | ICD-10-CM | POA: Diagnosis not present

## 2021-12-07 DIAGNOSIS — K219 Gastro-esophageal reflux disease without esophagitis: Secondary | ICD-10-CM | POA: Diagnosis not present

## 2021-12-07 DIAGNOSIS — T148XXA Other injury of unspecified body region, initial encounter: Secondary | ICD-10-CM | POA: Diagnosis not present

## 2021-12-07 DIAGNOSIS — M9902 Segmental and somatic dysfunction of thoracic region: Secondary | ICD-10-CM | POA: Diagnosis not present

## 2021-12-07 DIAGNOSIS — L0390 Cellulitis, unspecified: Secondary | ICD-10-CM | POA: Diagnosis not present

## 2021-12-07 DIAGNOSIS — B9561 Methicillin susceptible Staphylococcus aureus infection as the cause of diseases classified elsewhere: Secondary | ICD-10-CM | POA: Diagnosis not present

## 2021-12-07 DIAGNOSIS — R278 Other lack of coordination: Secondary | ICD-10-CM | POA: Diagnosis not present

## 2021-12-07 DIAGNOSIS — R6520 Severe sepsis without septic shock: Secondary | ICD-10-CM | POA: Diagnosis not present

## 2021-12-07 DIAGNOSIS — Z85820 Personal history of malignant melanoma of skin: Secondary | ICD-10-CM | POA: Diagnosis not present

## 2021-12-07 MED ORDER — CEPHALEXIN 500 MG PO CAPS: 500.0000 mg | ORAL_CAPSULE | Freq: Four times a day (QID) | ORAL | 0 refills | Status: DC

## 2021-12-07 MED ORDER — OXYCODONE HCL 5 MG PO TABS: 5.0000 mg | ORAL_TABLET | Freq: Four times a day (QID) | ORAL | 0 refills | Status: DC | PRN

## 2021-12-07 NOTE — Progress Notes (Signed)
ID Pt had some pain while the dressing was being changed No other complaints  O/e awake and alert Patient Vitals for the past 24 hrs:  BP Temp Temp src Pulse Resp SpO2  12/07/21 0848 (!) 116/51 97.8 F (36.6 C) -- (!) 59 16 96 %  12/07/21 0414 (!) 146/65 (!) 97.4 F (36.3 C) Oral 62 17 100 %  12/06/21 2115 (!) 140/70 (!) 97.4 F (36.3 C) Oral 66 -- 99 %  12/06/21 1739 133/75 -- -- (!) 57 -- --   Chest b/l air entry [id number] Abd soft Rt knee scar- no swelling, movt full Rt leg- dressing not removed- but reviewed picture the wife took today Ankle edema present   Labs    Latest Ref Rng & Units 12/05/2021    7:26 AM 12/02/2021    4:33 AM 12/01/2021   10:20 AM  CBC  WBC 4.0 - 10.5 K/uL 6.2  7.4  8.1   Hemoglobin 13.0 - 17.0 g/dL 11.8  11.5  11.9   Hematocrit 39.0 - 52.0 % 35.6  35.4  37.0   Platelets 150 - 400 K/uL 349  245  207        Latest Ref Rng & Units 12/05/2021    7:26 AM 12/03/2021    7:34 AM 12/02/2021    4:33 AM  CMP  Glucose 70 - 99 mg/dL 97   125   BUN 8 - 23 mg/dL 22   28   Creatinine 0.61 - 1.24 mg/dL 1.14  1.14  1.13   Sodium 135 - 145 mmol/L 138   139   Potassium 3.5 - 5.1 mmol/L 3.9   3.8   Chloride 98 - 111 mmol/L 104   109   CO2 22 - 32 mmol/L 27   25   Calcium 8.9 - 10.3 mg/dL 8.4   8.4   Total Protein 6.5 - 8.1 g/dL 5.9   5.5   Total Bilirubin 0.3 - 1.2 mg/dL 0.7   0.5   Alkaline Phos 38 - 126 U/L 67   71   AST 15 - 41 U/L 57   20   ALT 0 - 44 U/L 43   15      Micro Wound culture MSSA  Impression/recommendation  Rt shin infected wound with MSSA S/p debridement Some erythema around Been on appropriate Iv antibiotics since 11/28/21 Need to keep the leg elevated while in bed or chair Will need keflex '500mg'$  Po Q 6 until 10/5 ( may need more) Rt knee transplant- currently no evidence of PJI- observe closely  Discussed the management with patient and his wife Will follow him as OP next week

## 2021-12-07 NOTE — Discharge Summary (Signed)
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 Caucasian male with medical history significant for COPD, depression, CAD, hypothyroidism, and osteoarthritis, who presented to the ER with acute onset of right leg swelling with associated erythema and pain.  He is diagnosed with right lower extremity cellulitis, placed on vancomycin and cefepime.  I&D was performed by general surgery on 9/19.  9/21. Blood culture no growth, wound culture grew Staphylococcus aureus, cellulitis appears not improving, antibiotic changed to daptomycin. 9/27.  Condition improved, wound has MSSA.  Antibiotics switched to cefazolin 9/25.  Being D/C to [facility] today.  Abx change to Keflex 500 mg every 6 hours, last dose 12/15/2021 per ID.  Assessment and Plan: * Severe Sepsis due to cellulitis ([place]) - POA and now resolved - due to right anterior leg cellulitis due to infected wound. - Severe Sepsis is based on a lactic acid of 2.1 and AKI. manifested by significant leukocytosis, heart rate of 97 and respiratory rate of 22. - Treated initially with Dapto, cefepime and changed to cefazoline as no MRSA & GNR per ID. - Penrose removed; dressing replaced by surgery on 9/26 - Continue wound care: Pack with moistened 4x4 gauze, covered with dry 4x4  gauze, cover with ABD pad, and secure with Kerlix roll and tape - Pt has improved and the collection in the thigh has resolved. - Keep the leg elevated while in the bed  Dyslipidemia continue statin therapy and Zetia.  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  GERD without esophagitis - continue PPI therapy.         Consultants: Surgery, ID Procedures performed: I&D of right lower extremity wound, debridement of skin and subcutaneous tissue of right lower extremity for area   Disposition: Skilled nursing facility Diet recommendation:  Discharge Diet Orders (From admission, onward)     Start     Ordered   12/07/21 0000  Diet - low sodium heart healthy        12/07/21 1306           Carb modified diet DISCHARGE MEDICATION: Allergies as of 12/07/2021       Reactions   Penicillins Rash, Other (See Comments)   Other reaction(s): Other (see comments) Other reaction(s): UNKNOWN   Lexapro [escitalopram] Other (See Comments)   Increased hand tremor        Medication List     STOP taking these medications    sertraline 50 MG tablet Commonly known as: ZOLOFT       TAKE these medications    Alpha-Lipoic Acid 600 MG Caps Take by mouth.   ascorbic Acid 500 MG Cpcr Commonly known as: VITAMIN C Take by mouth.   buPROPion 150 MG 24 hr tablet Commonly known  as: WELLBUTRIN XL TAKE 1 TABLET BY MOUTH EVERY DAY   cephALEXin 500 MG capsule Commonly known as: KEFLEX Take 1 capsule (500 mg total) by mouth 4 (four) times daily for 8 days.   Coenzyme Q10 100 MG capsule Take by mouth.   cyanocobalamin 500 MCG tablet Commonly known as: VITAMIN B12 Take 1 tablet by mouth daily.   donepezil 10 MG tablet Commonly known as: ARICEPT Take 10 mg by mouth every morning.   ezetimibe 10 MG tablet Commonly known as: ZETIA TAKE 1 TABLET BY MOUTH EVERY DAY    levothyroxine 100 MCG tablet Commonly known as: SYNTHROID TAKE 1 TABLET BY MOUTH EVERY DAY BEFORE BREAKFAST   magnesium oxide 400 MG tablet Commonly known as: MAG-OX Take 400 mg by mouth daily.   neomycin-polymyxin-hydrocortisone 3.5-10000-1 OTIC suspension Commonly known as: CORTISPORIN Place 4 drops into the left ear 3 (three) times daily.   omeprazole 20 MG capsule Commonly known as: PRILOSEC Take 1 capsule by mouth at bedtime.   OVER THE COUNTER MEDICATION Take by mouth daily. Hemp oil   oxyCODONE 5 MG immediate release tablet Commonly known as: Roxicodone Take 1 tablet (5 mg total) by mouth every 6 (six) hours as needed for up to 3 days for breakthrough pain, severe pain or moderate pain.   Probiotic Acidophilus BioBeads Caps Take 1 capsule by mouth daily.   Probiotic + Turmeric Extract 400 MG Caps Take by mouth.   Restasis 0.05 % ophthalmic emulsion Generic drug: cycloSPORINE Restasis 0.05 % eye drops in a dropperette  INSTILL 1 DROP INTO BOTH EYES TWICE A DAY   rosuvastatin 10 MG tablet Commonly known as: CRESTOR Take 1 tablet (10 mg total) by mouth daily.   testosterone cypionate 200 MG/ML injection Commonly known as: DEPOTESTOSTERONE CYPIONATE INJECT 0.5 ML (100 MG TOTAL) INTO THE MUSCLE EVERY 14 DAYS.   traMADol 50 MG tablet Commonly known as: ULTRAM Take 50 mg by mouth every 6 (six) hours as needed.   Turmeric 400 MG Caps Take by mouth.   Vitamin D3 25 MCG (1000 UT) Caps Take 2 capsules (2,000 Units total) by mouth daily.               Discharge Care Instructions  (From admission, onward)           Start     Ordered   12/07/21 0000  Discharge wound care:       Comments: Pack with moistened 4x4 gauze, cover with dry 4x4 gauze, cover with ABD pad, and secure with Kerlix roll and tape. Change dressing every 2-3 days   12/07/21 1306   12/03/21 0000  Change dressing (specify)       Comments: Dressing change: one time per day using a dry  gauze to pack leg wound followed by Non sticky dressing  (Telfa) and wrap..   12/03/21 2121            Contact information for follow-up providers     Olean Ree, MD Follow up on 12/12/2021.   Specialty: General Surgery Contact information: [address] [place] [address] [phone]              Contact information for after-discharge care     Destination     [facility] .   Service: Skilled Nursing Contact information: [place] [phone]  Discharge Exam: Filed Weights   11/29/21 1440  Weight: 80 kg   General exam: Appears calm and comfortable  Respiratory system: Clear to auscultation. Respiratory effort normal. Cardiovascular system: S1 & S2 heard, RRR. No JVD, murmurs, rubs, gallops or clicks. No pedal edema. Gastrointestinal system: Abdomen is nondistended, soft and nontender. No organomegaly or masses felt. Normal bowel sounds heard. Central nervous system: Alert and oriented x2. No focal neurological deficits. Extremities: right anterior shin is still erythematous but improved and this is no longer tracking up medial left leg as seen previously, wound without evidence of infection or necrosis Psychiatry: Mood & affect appropriate.   Condition at discharge: fair  The results of significant diagnostics from this hospitalization (including imaging, microbiology, ancillary and laboratory) are listed below for reference.   Imaging Studies: CT FEMUR RIGHT W CONTRAST  Result Date: 12/03/2021 CLINICAL DATA:  Soft tissue infection.  Follow-up fluid collections. EXAM: CT OF THE LOWER RIGHT EXTREMITY WITH CONTRAST TECHNIQUE: Multidetector CT imaging of the lower right extremity was performed according to the standard protocol following intravenous contrast administration. RADIATION DOSE REDUCTION: This exam was performed according to the departmental  dose-optimization program which includes automated exposure control, adjustment of the mA and/or kV according to patient size and/or use of iterative reconstruction technique. CONTRAST:  112m OMNIPAQUE IOHEXOL 300 MG/ML  SOLN COMPARISON:  CT 12/01/2021 FINDINGS: RIGHT FEMUR: Bones/Joint/Cartilage No acute fracture or dislocation. No erosion or periosteal elevation. Similar degenerative changes of the right hip. No appreciable right hip joint effusion. No lytic or sclerotic bone lesion. Ligaments Suboptimally assessed by CT. Muscles and Tendons Similar degree of thickening in the region of the distal quadriceps tendon. No intramuscular fluid collections. Soft tissues Complete interval resolution of the previously described fluid within the medial compartment of the thigh extending along the course of the right gracilis muscle. Subcutaneous edema and ill-defined fluid along the medial and lateral aspects of the right thigh is similar to the previous study. No organized or rim enhancing fluid collection. No soft tissue gas. No right inguinal lymphadenopathy. Small bilateral hydroceles. Urinary bladder appears circumferentially thickened. RIGHT TIBIA-FIBULA: Bones/Joint/Cartilage No acute fracture. No dislocation. No site of erosion or periosteal elevation. The anteromedial aspect of the mid tibial diaphysis is again seen exposed at site of soft tissue debridement within the lower leg. Grossly intact right total knee arthroplasty hardware. Ligaments Suboptimally assessed by CT. Muscles and Tendons No acute musculotendinous abnormality by CT. No intramuscular fluid collection is seen. Soft tissues Wide soft tissue debridement site along the anteromedial aspect of the mid tibia with packing material. The degree of soft tissue edema of the lower leg has increased from prior. Surgical Penrose drain is again seen within the soft tissues medially. No soft tissue gas. No organized or rim enhancing fluid collections of the  lower leg. RIGHT FOOT: Bones/Joint/Cartilage No acute fracture or dislocation. No site of erosion or periosteal elevation. Similar degree of degenerative changes of the foot. No significant joint effusion is seen. Ligaments Suboptimally assessed by CT. Muscles and Tendons No acute musculotendinous abnormality by CT. No large tenosynovial fluid collection. Soft tissues Circumferential edema of the ankle and dorsal foot. No organized or rim enhancing fluid collection. No soft tissue gas. IMPRESSION: 1. Complete interval resolution of previously described fluid within the medial compartment of the right thigh seen along the course of the right gracilis muscle. 2. Increased soft tissue edema of the right lower leg, nonspecific but could reflect worsening cellulitis. No evidence of abscess or  soft tissue gas. 3. No acute osseous abnormality of the right lower extremity. 4. Circumferentially thickened appearance of the urinary bladder. Correlate with urinalysis. Electronically Signed   By: Davina Poke D.O.   On: 12/03/2021 17:22   CT TIBIA FIBULA RIGHT W CONTRAST  Result Date: 12/03/2021 CLINICAL DATA:  Soft tissue infection.  Follow-up fluid collections. EXAM: CT OF THE LOWER RIGHT EXTREMITY WITH CONTRAST TECHNIQUE: Multidetector CT imaging of the lower right extremity was performed according to the standard protocol following intravenous contrast administration. RADIATION DOSE REDUCTION: This exam was performed according to the departmental dose-optimization program which includes automated exposure control, adjustment of the mA and/or kV according to patient size and/or use of iterative reconstruction technique. CONTRAST:  148m OMNIPAQUE IOHEXOL 300 MG/ML  SOLN COMPARISON:  CT 12/01/2021 FINDINGS: RIGHT FEMUR: Bones/Joint/Cartilage No acute fracture or dislocation. No erosion or periosteal elevation. Similar degenerative changes of the right hip. No appreciable right hip joint effusion. No lytic or sclerotic  bone lesion. Ligaments Suboptimally assessed by CT. Muscles and Tendons Similar degree of thickening in the region of the distal quadriceps tendon. No intramuscular fluid collections. Soft tissues Complete interval resolution of the previously described fluid within the medial compartment of the thigh extending along the course of the right gracilis muscle. Subcutaneous edema and ill-defined fluid along the medial and lateral aspects of the right thigh is similar to the previous study. No organized or rim enhancing fluid collection. No soft tissue gas. No right inguinal lymphadenopathy. Small bilateral hydroceles. Urinary bladder appears circumferentially thickened. RIGHT TIBIA-FIBULA: Bones/Joint/Cartilage No acute fracture. No dislocation. No site of erosion or periosteal elevation. The anteromedial aspect of the mid tibial diaphysis is again seen exposed at site of soft tissue debridement within the lower leg. Grossly intact right total knee arthroplasty hardware. Ligaments Suboptimally assessed by CT. Muscles and Tendons No acute musculotendinous abnormality by CT. No intramuscular fluid collection is seen. Soft tissues Wide soft tissue debridement site along the anteromedial aspect of the mid tibia with packing material. The degree of soft tissue edema of the lower leg has increased from prior. Surgical Penrose drain is again seen within the soft tissues medially. No soft tissue gas. No organized or rim enhancing fluid collections of the lower leg. RIGHT FOOT: Bones/Joint/Cartilage No acute fracture or dislocation. No site of erosion or periosteal elevation. Similar degree of degenerative changes of the foot. No significant joint effusion is seen. Ligaments Suboptimally assessed by CT. Muscles and Tendons No acute musculotendinous abnormality by CT. No large tenosynovial fluid collection. Soft tissues Circumferential edema of the ankle and dorsal foot. No organized or rim enhancing fluid collection. No soft  tissue gas. IMPRESSION: 1. Complete interval resolution of previously described fluid within the medial compartment of the right thigh seen along the course of the right gracilis muscle. 2. Increased soft tissue edema of the right lower leg, nonspecific but could reflect worsening cellulitis. No evidence of abscess or soft tissue gas. 3. No acute osseous abnormality of the right lower extremity. 4. Circumferentially thickened appearance of the urinary bladder. Correlate with urinalysis. Electronically Signed   By: NDavina PokeD.O.   On: 12/03/2021 17:22   CT FOOT RIGHT W CONTRAST  Result Date: 12/03/2021 CLINICAL DATA:  Soft tissue infection.  Follow-up fluid collections. EXAM: CT OF THE LOWER RIGHT EXTREMITY WITH CONTRAST TECHNIQUE: Multidetector CT imaging of the lower right extremity was performed according to the standard protocol following intravenous contrast administration. RADIATION DOSE REDUCTION: This exam was performed according to  the departmental dose-optimization program which includes automated exposure control, adjustment of the mA and/or kV according to patient size and/or use of iterative reconstruction technique. CONTRAST:  139m OMNIPAQUE IOHEXOL 300 MG/ML  SOLN COMPARISON:  CT 12/01/2021 FINDINGS: RIGHT FEMUR: Bones/Joint/Cartilage No acute fracture or dislocation. No erosion or periosteal elevation. Similar degenerative changes of the right hip. No appreciable right hip joint effusion. No lytic or sclerotic bone lesion. Ligaments Suboptimally assessed by CT. Muscles and Tendons Similar degree of thickening in the region of the distal quadriceps tendon. No intramuscular fluid collections. Soft tissues Complete interval resolution of the previously described fluid within the medial compartment of the thigh extending along the course of the right gracilis muscle. Subcutaneous edema and ill-defined fluid along the medial and lateral aspects of the right thigh is similar to the previous  study. No organized or rim enhancing fluid collection. No soft tissue gas. No right inguinal lymphadenopathy. Small bilateral hydroceles. Urinary bladder appears circumferentially thickened. RIGHT TIBIA-FIBULA: Bones/Joint/Cartilage No acute fracture. No dislocation. No site of erosion or periosteal elevation. The anteromedial aspect of the mid tibial diaphysis is again seen exposed at site of soft tissue debridement within the lower leg. Grossly intact right total knee arthroplasty hardware. Ligaments Suboptimally assessed by CT. Muscles and Tendons No acute musculotendinous abnormality by CT. No intramuscular fluid collection is seen. Soft tissues Wide soft tissue debridement site along the anteromedial aspect of the mid tibia with packing material. The degree of soft tissue edema of the lower leg has increased from prior. Surgical Penrose drain is again seen within the soft tissues medially. No soft tissue gas. No organized or rim enhancing fluid collections of the lower leg. RIGHT FOOT: Bones/Joint/Cartilage No acute fracture or dislocation. No site of erosion or periosteal elevation. Similar degree of degenerative changes of the foot. No significant joint effusion is seen. Ligaments Suboptimally assessed by CT. Muscles and Tendons No acute musculotendinous abnormality by CT. No large tenosynovial fluid collection. Soft tissues Circumferential edema of the ankle and dorsal foot. No organized or rim enhancing fluid collection. No soft tissue gas. IMPRESSION: 1. Complete interval resolution of previously described fluid within the medial compartment of the right thigh seen along the course of the right gracilis muscle. 2. Increased soft tissue edema of the right lower leg, nonspecific but could reflect worsening cellulitis. No evidence of abscess or soft tissue gas. 3. No acute osseous abnormality of the right lower extremity. 4. Circumferentially thickened appearance of the urinary bladder. Correlate with  urinalysis. Electronically Signed   By: NDavina PokeD.O.   On: 12/03/2021 17:22   CT FEMUR RIGHT W CONTRAST  Result Date: 12/01/2021 CLINICAL DATA:  Soft tissue infection suspected, thigh, xray done RLE abscess, s/p debridement, now with worsening erythema extending up medial right leg EXAM: CT OF THE LOWER RIGHT EXTREMITY WITH CONTRAST TECHNIQUE: Multidetector CT imaging of the lower right femur, tibia-fibula, and foot were performed according to the standard protocol following intravenous contrast administration. RADIATION DOSE REDUCTION: This exam was performed according to the departmental dose-optimization program which includes automated exposure control, adjustment of the mA and/or kV according to patient size and/or use of iterative reconstruction technique. CONTRAST:  1085mOMNIPAQUE IOHEXOL 300 MG/ML  SOLN COMPARISON:  X-ray 11/28/2021 FINDINGS: RIGHT FEMUR: Bones/Joint/Cartilage No acute fracture. No dislocation. No erosion or periosteal elevation. Mild-moderate osteoarthritis of the right hip. No hip joint effusion. No lytic or sclerotic bone lesion. Ligaments Suboptimally assessed by CT. Muscles and Tendons No acute musculotendinous abnormality by CT.  Thickened appearance of the distal quadriceps tendon, evaluation is largely obscured by streak artifact from patient's knee arthroplasty. No intramuscular fluid collections are seen. Soft tissues Subcutaneous edema throughout the right thigh. There is fluid within the superficial aspect of the medial compartment of the proximal to mid right thigh extending along the posterior aspect of the gracilis muscle approximately 15 cm in length (series 7, image 165; series 9, image 113). No well-defined rim enhancement of this fluid. Elsewhere, no focal fluid collections within the thigh. No soft tissue gas. Atherosclerotic vascular calcifications. No right inguinal lymphadenopathy. Small amount of free fluid is seen within the pelvis. Small bilateral  hydroceles. RIGHT TIBIA-FIBULA: Bones/Joint/Cartilage No acute fracture. No dislocation. No focal erosion or site of periosteal elevation. The anteromedial aspect of the mid tibial diaphysis appears to be exposed at the site of soft tissue debridement. Postsurgical changes from right total knee arthroplasty without periprosthetic lucency or fracture. Moderate-sized complex-appearing knee joint effusion, nonspecific. Ligaments Suboptimally assessed by CT. Muscles and Tendons No acute musculotendinous abnormality by CT. No intramuscular fluid collection is seen. Soft tissues Large area of soft tissue debridement overlying the anteromedial aspect of the mid tibia with packing material evident at the debridement site. A surgical drain is located within the soft tissues superomedial to the debridement site. Circumferential soft tissue edema with skin thickening and ill-defined fluid throughout the lower leg. No organized or rim enhancing fluid collections. No soft tissue gas. Atherosclerotic vascular calcifications. RIGHT FOOT: Bones/Joint/Cartilage No acute fracture or dislocation. No focal erosion or site of periosteal elevation. Moderate degenerative changes of the foot, most pronounced at the first tarsometatarsal joint and within the interphalangeal joints of the toes. Multiple hammertoe deformities. No large tibiotalar or subtalar joint effusion. Ligaments Suboptimally assessed by CT. Muscles and Tendons No acute musculotendinous abnormality by CT. No appreciable tenosynovial fluid collection. Soft tissues Circumferential edema at the ankle and extending to the dorsum of the foot. No organized or rim enhancing fluid collections. Prominent edema within Kager's fat. No site of wound or ulceration is seen. No soft tissue gas. IMPRESSION: 1. Large area of soft tissue debridement overlying the anteromedial aspect of the mid right tibia with packing material evident at the debridement site. The anteromedial aspect of the  mid tibial diaphysis appears to be exposed at the site of soft tissue debridement without findings to suggest osteomyelitis. 2. Circumferential soft tissue edema with skin thickening and ill-defined fluid throughout the right lower leg, ankle, and dorsal foot suggestive of cellulitis. No organized or rim enhancing fluid collections identified. No soft tissue gas. 3. Soft tissue edema is also present to a lesser degree within the right thigh. There is fluid within the superficial aspect of the medial compartment of the proximal to mid right thigh extending along the posterior aspect of the gracilis muscle approximately 15 cm in length. No rim enhancement to suggest abscess at this time. 4. Moderate-sized complex-appearing knee joint effusion, nonspecific. Septic arthritis is not excluded. 5. No CT evidence of osteomyelitis of the femur, tibia, fibula, or foot. 6. Small amount of free fluid within the pelvis, nonspecific. Electronically Signed   By: Davina Poke D.O.   On: 12/01/2021 10:19   CT TIBIA FIBULA RIGHT W CONTRAST  Result Date: 12/01/2021 CLINICAL DATA:  Soft tissue infection suspected, thigh, xray done RLE abscess, s/p debridement, now with worsening erythema extending up medial right leg EXAM: CT OF THE LOWER RIGHT EXTREMITY WITH CONTRAST TECHNIQUE: Multidetector CT imaging of the lower right femur, tibia-fibula,  and foot were performed according to the standard protocol following intravenous contrast administration. RADIATION DOSE REDUCTION: This exam was performed according to the departmental dose-optimization program which includes automated exposure control, adjustment of the mA and/or kV according to patient size and/or use of iterative reconstruction technique. CONTRAST:  156m OMNIPAQUE IOHEXOL 300 MG/ML  SOLN COMPARISON:  X-ray 11/28/2021 FINDINGS: RIGHT FEMUR: Bones/Joint/Cartilage No acute fracture. No dislocation. No erosion or periosteal elevation. Mild-moderate osteoarthritis of the  right hip. No hip joint effusion. No lytic or sclerotic bone lesion. Ligaments Suboptimally assessed by CT. Muscles and Tendons No acute musculotendinous abnormality by CT. Thickened appearance of the distal quadriceps tendon, evaluation is largely obscured by streak artifact from patient's knee arthroplasty. No intramuscular fluid collections are seen. Soft tissues Subcutaneous edema throughout the right thigh. There is fluid within the superficial aspect of the medial compartment of the proximal to mid right thigh extending along the posterior aspect of the gracilis muscle approximately 15 cm in length (series 7, image 165; series 9, image 113). No well-defined rim enhancement of this fluid. Elsewhere, no focal fluid collections within the thigh. No soft tissue gas. Atherosclerotic vascular calcifications. No right inguinal lymphadenopathy. Small amount of free fluid is seen within the pelvis. Small bilateral hydroceles. RIGHT TIBIA-FIBULA: Bones/Joint/Cartilage No acute fracture. No dislocation. No focal erosion or site of periosteal elevation. The anteromedial aspect of the mid tibial diaphysis appears to be exposed at the site of soft tissue debridement. Postsurgical changes from right total knee arthroplasty without periprosthetic lucency or fracture. Moderate-sized complex-appearing knee joint effusion, nonspecific. Ligaments Suboptimally assessed by CT. Muscles and Tendons No acute musculotendinous abnormality by CT. No intramuscular fluid collection is seen. Soft tissues Large area of soft tissue debridement overlying the anteromedial aspect of the mid tibia with packing material evident at the debridement site. A surgical drain is located within the soft tissues superomedial to the debridement site. Circumferential soft tissue edema with skin thickening and ill-defined fluid throughout the lower leg. No organized or rim enhancing fluid collections. No soft tissue gas. Atherosclerotic vascular  calcifications. RIGHT FOOT: Bones/Joint/Cartilage No acute fracture or dislocation. No focal erosion or site of periosteal elevation. Moderate degenerative changes of the foot, most pronounced at the first tarsometatarsal joint and within the interphalangeal joints of the toes. Multiple hammertoe deformities. No large tibiotalar or subtalar joint effusion. Ligaments Suboptimally assessed by CT. Muscles and Tendons No acute musculotendinous abnormality by CT. No appreciable tenosynovial fluid collection. Soft tissues Circumferential edema at the ankle and extending to the dorsum of the foot. No organized or rim enhancing fluid collections. Prominent edema within Kager's fat. No site of wound or ulceration is seen. No soft tissue gas. IMPRESSION: 1. Large area of soft tissue debridement overlying the anteromedial aspect of the mid right tibia with packing material evident at the debridement site. The anteromedial aspect of the mid tibial diaphysis appears to be exposed at the site of soft tissue debridement without findings to suggest osteomyelitis. 2. Circumferential soft tissue edema with skin thickening and ill-defined fluid throughout the right lower leg, ankle, and dorsal foot suggestive of cellulitis. No organized or rim enhancing fluid collections identified. No soft tissue gas. 3. Soft tissue edema is also present to a lesser degree within the right thigh. There is fluid within the superficial aspect of the medial compartment of the proximal to mid right thigh extending along the posterior aspect of the gracilis muscle approximately 15 cm in length. No rim enhancement to suggest abscess at this  time. 4. Moderate-sized complex-appearing knee joint effusion, nonspecific. Septic arthritis is not excluded. 5. No CT evidence of osteomyelitis of the femur, tibia, fibula, or foot. 6. Small amount of free fluid within the pelvis, nonspecific. Electronically Signed   By: Davina Poke D.O.   On: 12/01/2021 10:19    CT FOOT RIGHT W CONTRAST  Result Date: 12/01/2021 CLINICAL DATA:  Soft tissue infection suspected, thigh, xray done RLE abscess, s/p debridement, now with worsening erythema extending up medial right leg EXAM: CT OF THE LOWER RIGHT EXTREMITY WITH CONTRAST TECHNIQUE: Multidetector CT imaging of the lower right femur, tibia-fibula, and foot were performed according to the standard protocol following intravenous contrast administration. RADIATION DOSE REDUCTION: This exam was performed according to the departmental dose-optimization program which includes automated exposure control, adjustment of the mA and/or kV according to patient size and/or use of iterative reconstruction technique. CONTRAST:  196m OMNIPAQUE IOHEXOL 300 MG/ML  SOLN COMPARISON:  X-ray 11/28/2021 FINDINGS: RIGHT FEMUR: Bones/Joint/Cartilage No acute fracture. No dislocation. No erosion or periosteal elevation. Mild-moderate osteoarthritis of the right hip. No hip joint effusion. No lytic or sclerotic bone lesion. Ligaments Suboptimally assessed by CT. Muscles and Tendons No acute musculotendinous abnormality by CT. Thickened appearance of the distal quadriceps tendon, evaluation is largely obscured by streak artifact from patient's knee arthroplasty. No intramuscular fluid collections are seen. Soft tissues Subcutaneous edema throughout the right thigh. There is fluid within the superficial aspect of the medial compartment of the proximal to mid right thigh extending along the posterior aspect of the gracilis muscle approximately 15 cm in length (series 7, image 165; series 9, image 113). No well-defined rim enhancement of this fluid. Elsewhere, no focal fluid collections within the thigh. No soft tissue gas. Atherosclerotic vascular calcifications. No right inguinal lymphadenopathy. Small amount of free fluid is seen within the pelvis. Small bilateral hydroceles. RIGHT TIBIA-FIBULA: Bones/Joint/Cartilage No acute fracture. No dislocation. No  focal erosion or site of periosteal elevation. The anteromedial aspect of the mid tibial diaphysis appears to be exposed at the site of soft tissue debridement. Postsurgical changes from right total knee arthroplasty without periprosthetic lucency or fracture. Moderate-sized complex-appearing knee joint effusion, nonspecific. Ligaments Suboptimally assessed by CT. Muscles and Tendons No acute musculotendinous abnormality by CT. No intramuscular fluid collection is seen. Soft tissues Large area of soft tissue debridement overlying the anteromedial aspect of the mid tibia with packing material evident at the debridement site. A surgical drain is located within the soft tissues superomedial to the debridement site. Circumferential soft tissue edema with skin thickening and ill-defined fluid throughout the lower leg. No organized or rim enhancing fluid collections. No soft tissue gas. Atherosclerotic vascular calcifications. RIGHT FOOT: Bones/Joint/Cartilage No acute fracture or dislocation. No focal erosion or site of periosteal elevation. Moderate degenerative changes of the foot, most pronounced at the first tarsometatarsal joint and within the interphalangeal joints of the toes. Multiple hammertoe deformities. No large tibiotalar or subtalar joint effusion. Ligaments Suboptimally assessed by CT. Muscles and Tendons No acute musculotendinous abnormality by CT. No appreciable tenosynovial fluid collection. Soft tissues Circumferential edema at the ankle and extending to the dorsum of the foot. No organized or rim enhancing fluid collections. Prominent edema within Kager's fat. No site of wound or ulceration is seen. No soft tissue gas. IMPRESSION: 1. Large area of soft tissue debridement overlying the anteromedial aspect of the mid right tibia with packing material evident at the debridement site. The anteromedial aspect of the mid tibial diaphysis appears to be  exposed at the site of soft tissue debridement without  findings to suggest osteomyelitis. 2. Circumferential soft tissue edema with skin thickening and ill-defined fluid throughout the right lower leg, ankle, and dorsal foot suggestive of cellulitis. No organized or rim enhancing fluid collections identified. No soft tissue gas. 3. Soft tissue edema is also present to a lesser degree within the right thigh. There is fluid within the superficial aspect of the medial compartment of the proximal to mid right thigh extending along the posterior aspect of the gracilis muscle approximately 15 cm in length. No rim enhancement to suggest abscess at this time. 4. Moderate-sized complex-appearing knee joint effusion, nonspecific. Septic arthritis is not excluded. 5. No CT evidence of osteomyelitis of the femur, tibia, fibula, or foot. 6. Small amount of free fluid within the pelvis, nonspecific. Electronically Signed   By: Davina Poke D.O.   On: 12/01/2021 10:19   DG Tibia/Fibula Right  Result Date: 11/28/2021 CLINICAL DATA:  Cellulitis right lower leg. Infection. Leg injury a few days ago and had stitches placed. Bandage in place at this time. EXAM: RIGHT TIBIA AND FIBULA - 2 VIEW COMPARISON:  None Available. FINDINGS: Postsurgical changes of total right knee arthroplasty. The superior aspect of the distal femoral hardware is seen on lateral view only. No perihardware lucency is seen to indicate hardware failure or loosening. There are 2 small chronic ossicles at the superior aspect of the tibial tubercle. No overlying anterior soft tissue swelling. Mild distal medial and lateral malleolar degenerative osteophytes at the ankle. No cortical erosion. No acute fracture is seen. No dislocation. Mild vascular calcifications. IMPRESSION: 1. Status post total right knee arthroplasty. No evidence of hardware failure. 2. No cortical erosion to indicate radiographic evidence of acute osteomyelitis. Electronically Signed   By: Yvonne Kendall M.D.   On: 11/28/2021 13:41     Microbiology: Results for orders placed or performed during the hospital encounter of 11/28/21  Blood culture (routine x 2)     Status: None   Collection Time: 11/28/21 12:49 PM   Specimen: BLOOD  Result Value Ref Range Status   Specimen Description BLOOD BLOOD RIGHT FOREARM  Final   Special Requests   Final    BOTTLES DRAWN AEROBIC AND ANAEROBIC Blood Culture adequate volume   Culture   Final    NO GROWTH 5 DAYS Performed at [facility], [address] [place], [place] [address]    Report Status 12/03/2021 FINAL  Final  Blood culture (routine x 2)     Status: None   Collection Time: 11/28/21  1:10 PM   Specimen: BLOOD  Result Value Ref Range Status   Specimen Description BLOOD BLOOD RIGHT FOREARM  Final   Special Requests   Final    BOTTLES DRAWN AEROBIC AND ANAEROBIC Blood Culture results may not be optimal due to an inadequate volume of blood received in culture bottles   Culture   Final    NO GROWTH 5 DAYS Performed at [facility], [address] [place], [place] [address]    Report Status 12/03/2021 FINAL  Final  Aerobic/Anaerobic Culture w Gram Stain (surgical/deep wound)     Status: None   Collection Time: 11/29/21  3:56 PM   Specimen: PATH Other; Tissue  Result Value Ref Range Status   Specimen Description   Final    WOUND RIGHTLOWERLEGABSCESS Performed at [facility], [address], [place], [place] [address]    Special Requests   Final    NONE  Performed at [facility], [address] [place], [place] [address]    Gram Stain   Final    ABUNDANT WBC PRESENT, PREDOMINANTLY PMN FEW GRAM POSITIVE COCCI IN PAIRS IN CLUSTERS    Culture   Final    MODERATE STAPHYLOCOCCUS AUREUS NO ANAEROBES ISOLATED Performed at [facility], [place] [address], [place], [place] [address]    Report Status 12/04/2021 FINAL  Final   Organism ID, Bacteria STAPHYLOCOCCUS AUREUS  Final      Susceptibility   Staphylococcus aureus -  MIC*    CIPROFLOXACIN <=0.5 SENSITIVE Sensitive     ERYTHROMYCIN <=0.25 SENSITIVE Sensitive     GENTAMICIN <=0.5 SENSITIVE Sensitive     OXACILLIN 0.5 SENSITIVE Sensitive     TETRACYCLINE <=1 SENSITIVE Sensitive     VANCOMYCIN <=0.5 SENSITIVE Sensitive     TRIMETH/SULFA <=10 SENSITIVE Sensitive     CLINDAMYCIN <=0.25 SENSITIVE Sensitive     RIFAMPIN <=0.5 SENSITIVE Sensitive     Inducible Clindamycin NEGATIVE Sensitive     * MODERATE STAPHYLOCOCCUS AUREUS    Labs: CBC: Recent Labs  Lab 12/01/21 1020 12/02/21 0433 12/05/21 0726  WBC 8.1 7.4 6.2  HGB 11.9* 11.5* 11.8*  HCT 37.0* 35.4* 35.6*  MCV 96.6 94.4 93.2  PLT 207 245 009   Basic Metabolic Panel: Recent Labs  Lab 12/01/21 0528 12/02/21 0433 12/03/21 0734 12/05/21 0726  NA 137 139  --  138  K 3.6 3.8  --  3.9  CL 107 109  --  104  CO2 24 25  --  27  GLUCOSE 111* 125*  --  97  BUN 26* 28*  --  22  CREATININE 1.26* 1.13 1.14 1.14  CALCIUM 8.2* 8.4*  --  8.4*  MG  --   --   --  2.1   Liver Function Tests: Recent Labs  Lab 12/02/21 0433 12/05/21 0726  AST 20 57*  ALT 15 43  ALKPHOS 71 67  BILITOT 0.5 0.7  PROT 5.5* 5.9*  ALBUMIN 2.4* 2.6*   CBG: No results for input(s): "GLUCAP" in the last 168 hours.  Discharge time spent: greater than 30 minutes.  Signed: Max Sane, MD [facility] 12/07/2021

## 2021-12-07 NOTE — TOC Progression Note (Signed)
Transition of Care [facility]) - Progression Note    Patient Details  Name: Theodore [name] MRN: [id number] Date of Birth: Jun 09, 1929  [facility]) CM/SW Contact  Laurena Slimmer, RN Phone Number: 12/07/2021, 12:59 PM  Clinical Narrative:    Per Marlowe Kays at [facility] patient approved for [facility] for 7 days auth [id number] and [id number].  [facility] notified patient was authorized and could admit today.   Spoke with patient and wife at bedside to advise  Josem Kaufmann was approved and patient was approved for [facility]. Wife will have patient transported by a friend to the facility.  Spoke with Seth Bake in admissions. Room number and where to call report will be provided once discharge summary is in.            Expected Discharge Plan and Services                                                 Social Determinants of Health (SDOH) Interventions    Readmission Risk Interventions     No data to display

## 2021-12-07 NOTE — Progress Notes (Signed)
[facility]) Hospital Liaison note:  Notified via Epic workque from Dr. Max Sane of request for [place] services. Will continue to follow for disposition.  Please call with any outpatient palliative questions or concerns.  Thank you for the opportunity to participate in this patient's care.  Thank you, Lorelee Market, LPN [facility] Liaison [phone]

## 2021-12-07 NOTE — Progress Notes (Signed)
Dressing change complete prior to discharge. Patients family friend will pick patient up to bring to [facility]. Report called at this time successfully. Paper prescriotions sent with patient and extra supplies. Will assist patient with getting dressed. No further needs.

## 2021-12-07 NOTE — TOC Transition Note (Signed)
Transition of Care [facility]) - CM/SW Discharge Note   Patient Details  Name: Theodore [name] MRN: [id number] Date of Birth: 1929/08/30  Transition of Care [facility]) CM/SW Contact:  Laurena Slimmer, RN Phone Number: 12/07/2021, 2:01 PM   Clinical Narrative:    Discharge summary sent in [place]. Called facility to advised summary was sent. Patient assigned to room 102. Nurse will call report to [phone].    Wife notified of discharge today.   Nurse provided with room number and where to call report. TOC signing off.          Patient Goals and CMS Choice        Discharge Placement                       Discharge Plan and Services                                     Social Determinants of Health (SDOH) Interventions     Readmission Risk Interventions     No data to display

## 2021-12-08 ENCOUNTER — Telehealth: Payer: Self-pay

## 2021-12-08 ENCOUNTER — Ambulatory Visit: Payer: PPO | Admitting: Urology

## 2021-12-08 ENCOUNTER — Encounter: Payer: PPO | Admitting: Speech Pathology

## 2021-12-08 ENCOUNTER — Telehealth: Payer: Self-pay | Admitting: Internal Medicine

## 2021-12-08 NOTE — Telephone Encounter (Signed)
Placed in red folder  

## 2021-12-08 NOTE — Telephone Encounter (Signed)
FYI

## 2021-12-08 NOTE — Telephone Encounter (Signed)
We received a fax from [facility] stating patient was being discharged from hospital and we needed to schedule a hospital follow-up with PCP.  I called patient and was told by his wife, Nashaun Hillmer, that he was in [facility].

## 2021-12-08 NOTE — Telephone Encounter (Signed)
Pt wife came in the office stating the cma wanted her to bring a  POA for the pt. Placed in provider folder

## 2021-12-09 ENCOUNTER — Encounter: Payer: Self-pay | Admitting: Student

## 2021-12-09 ENCOUNTER — Non-Acute Institutional Stay (SKILLED_NURSING_FACILITY): Payer: PPO | Admitting: Student

## 2021-12-09 DIAGNOSIS — I739 Peripheral vascular disease, unspecified: Secondary | ICD-10-CM

## 2021-12-09 DIAGNOSIS — R0609 Other forms of dyspnea: Secondary | ICD-10-CM | POA: Diagnosis not present

## 2021-12-09 DIAGNOSIS — N1832 Chronic kidney disease, stage 3b: Secondary | ICD-10-CM | POA: Diagnosis not present

## 2021-12-09 DIAGNOSIS — E038 Other specified hypothyroidism: Secondary | ICD-10-CM

## 2021-12-09 DIAGNOSIS — J449 Chronic obstructive pulmonary disease, unspecified: Secondary | ICD-10-CM | POA: Diagnosis not present

## 2021-12-09 DIAGNOSIS — L240 Irritant contact dermatitis due to detergents: Secondary | ICD-10-CM

## 2021-12-09 DIAGNOSIS — G3184 Mild cognitive impairment, so stated: Secondary | ICD-10-CM

## 2021-12-09 DIAGNOSIS — L03115 Cellulitis of right lower limb: Secondary | ICD-10-CM

## 2021-12-09 DIAGNOSIS — F325 Major depressive disorder, single episode, in full remission: Secondary | ICD-10-CM | POA: Diagnosis not present

## 2021-12-09 DIAGNOSIS — F418 Other specified anxiety disorders: Secondary | ICD-10-CM | POA: Diagnosis not present

## 2021-12-09 MED ORDER — HYDROCORTISONE 1 % EX OINT: 1.0000 | TOPICAL_OINTMENT | Freq: Two times a day (BID) | CUTANEOUS | 0 refills | Status: DC

## 2021-12-09 MED ORDER — OXYCODONE HCL 5 MG PO TABS: 5.0000 mg | ORAL_TABLET | Freq: Four times a day (QID) | ORAL | 0 refills | Status: DC | PRN

## 2021-12-09 MED ORDER — BUPROPION HCL 75 MG PO TABS: 75.0000 mg | ORAL_TABLET | Freq: Two times a day (BID) | ORAL | 0 refills | Status: DC

## 2021-12-09 MED ORDER — BUPROPION HCL ER (SR) 100 MG PO TB12: 100.0000 mg | ORAL_TABLET | Freq: Every day | ORAL | 0 refills | Status: DC

## 2021-12-09 NOTE — Progress Notes (Signed)
Provider:  Dewayne Shorter, MD Location:   [facility] Room Number: 585 Place of Service:  SNF (31)  PCP: [name], MD Patient Care Team: [name], MD as PCP - General (Internal Medicine) Minna Merritts, MD as PCP - Cardiology (Cardiology)  Extended Emergency Contact Information Primary Emergency Contact: [place] Phone: [phone] Mobile Phone: [phone] Relation: Spouse  Code Status: FULL CODE Goals of Care: Advanced Directive information    12/09/2021   10:37 AM  Advanced Directives  Does Patient Have a Medical Advance Directive? No  Does patient want to make changes to medical advance directive? No - Patient declined      Chief Complaint  Patient presents with   New Admit To SNF    New admission.    HPI: Patient is a 88 y.o. male seen today for admission to [facility] for therapy. Patient with past medical history outlined below.   On 9/15 he had a fall in his attic and was admitted on 9/18 for sepsis secondary to wound infection. He is status post debridement of the wound and currently has packing. He is here for wound care and therapy. He had a procedure done for his tremors that are known to increase risk of fall and changes in balance lead to the fall. This was after an ultrasound procedure he had. He tried to stand on the floor in the attic. His good leg gave out and he lost his grip He fell down on his shin on the sharp edge of the plywood.  Neuravive - it was an outpatient procedure. He had the right side done 2 years ago, then he had approval for the other side about 2 months ago.   Went to urgent care, got 13 stitches. Went to the doctor at the hospital took the stitches out due to the infection. he had antibiotics there. In the hosptial he had some "cognitive problems." He feels lie it was a side effect of the proceudre such as searching for word finding Name recognition have been difficult  since having the neuriva procedure.   He had a couple (12 to be exact per his wife) of falls other than this one. Denied symptoms at the time of the fall. He lost his balance-- had bannister on one side and papers in his hand and lost his grip and fell.   Slums 28/30 on donepezil since December 2020 but continues to have bad dreams despite starting the medication. Discussed with he and his wife concern for risk of falling  heart evaluated and pulm for SOB with exertion - all tests were negative.   Family history Father was 83 MI and mother 35 She had a stroke. Brother died in 20 during war and his sister died young in her 40s.   Social History Smoke never Alcohol none in the last 20 years [name] for 33 years.  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 most rewarding thing is a 5yo kid from church wants to play harmonica.   Surgical History knee replacement.  Past Medical History:  Diagnosis Date   Actinic keratosis    Arthritis    knees   Asymptomatic Sinus bradycardia    a. 05/2016 Zio: Avg HR 61 (41-167).   Benign essential tremor    Cancer ([place]) 2002  melanoma right ear   Colon polyp    COPD (chronic obstructive pulmonary disease) ([place])    pt said he believes it is a misdiagnosis    Coronary artery calcification seen on CT scan    a. 06/2016 CTA Chest: cor Ca2+; b. 05/2017 MV: EF >65%. No ischemia/infarct.   Depression    History of echocardiogram    a. 04/2016 Echo: EF 60-65%, mild conc LVH. Nl PASP.   History of kidney stones    Hx of dysplastic nevus 05/07/2017   L anterior shoulder, severe   Hx of dysplastic nevus 09/06/2020   R lower sternum, moderate atypia   Hypothyroidism    Hypothyroidism    Melanoma ([place]) 1990's per pt   R ear   Melanoma in situ ([place]) 01/12/2014   left jaw   Palpitations    a. 05/2016 Zio: Avg  HR 61 (41-167). 9 SVT runs (fastest 167 - 5 beats; longest 8 beats - 101 bpm). Rare PACs/PVCs.   PVC's (premature ventricular contractions)    Rhinitis    Wears dentures    partial upper and lower   Wears hearing aid in both ears    Past Surgical History:  Procedure Laterality Date   ADENOIDECTOMY     CATARACT EXTRACTION W/PHACO Left 10/22/2017   Procedure: CATARACT EXTRACTION PHACO AND INTRAOCULAR LENS PLACEMENT (IOC)  LEFT;  Surgeon: Eulogio Bear, MD;  Location: [place];  Service: Ophthalmology;  Laterality: Left;   CATARACT EXTRACTION W/PHACO Right 11/20/2017   Procedure: CATARACT EXTRACTION PHACO AND INTRAOCULAR LENS PLACEMENT (IOC) RIGHT;  Surgeon: Eulogio Bear, MD;  Location: [facility];  Service: Ophthalmology;  Laterality: Right;   ESOPHAGOGASTRODUODENOSCOPY (EGD) WITH PROPOFOL N/A 10/31/2016   Procedure: ESOPHAGOGASTRODUODENOSCOPY (EGD) WITH PROPOFOL;  Surgeon: Lollie Sails, MD;  Location: [facility] ENDOSCOPY;  Service: Endoscopy;  Laterality: N/A;   I & D EXTREMITY Right 11/29/2021   Procedure: IRRIGATION AND DEBRIDEMENT EXTREMITY;  Surgeon: Olean Ree, MD;  Location: [facility] ORS;  Service: General;  Laterality: Right;   JOINT REPLACEMENT Right 2003   knee   knee meniscus repair Right    MELANOMA EXCISION Right    ear. Followed by Dr. Nehemiah Massed   PATELLECTOMY Bilateral    TONSILLECTOMY      reports that he has never smoked. He has never used smokeless tobacco. He reports current alcohol use. He reports that he does not use drugs. Social History   Socioeconomic History   Marital status: Married    Spouse name: [name]   Number of children: 4   Years of education: 16   Highest education level: Not on file  Occupational History   Occupation: Retired    Comment: Architect -   Tobacco Use   Smoking status: Never   Smokeless tobacco: Never  Vaping Use   Vaping Use: Never used  Substance and Sexual Activity   Alcohol use: Yes    Comment: 1  glass of wine daily   Drug use: No   Sexual activity: Not Currently  Other Topics Concern   Not on file  Social History Narrative   Mr. Kassel grew up in [place], [place]. 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 He and his wife are living at [facility]. He is very active at [facility]. He enjoys music.  Social Determinants of Health   Financial Resource Strain: Low Risk  (08/05/2021)   Overall Financial Resource Strain (CARDIA)    Difficulty of Paying Living Expenses: Not hard at all  Food Insecurity: No Food Insecurity (11/28/2021)   Hunger Vital Sign    Worried About Running Out of Food in the Last Year: Never true    Ran Out of Food in the Last Year: Never true  Transportation Needs: No Transportation Needs (11/28/2021)   PRAPARE - Hydrologist (Medical): No    Lack of Transportation (Non-Medical): No  Physical Activity: Insufficiently Active (08/05/2021)   Exercise Vital Sign    Days of Exercise per Week: 4 days    Minutes of Exercise per Session: 30 min  Stress: No Stress Concern Present (08/05/2021)   [place]arc[place]    Feeling of Stress : Only a little  Social Connections: Socially Integrated (08/05/2021)   Social Connection and Isolation Panel [NHANES]    Frequency of Communication with Friends and Family: More than three times a week    Frequency of Social Gatherings with Friends and Family: Once a week    Attends Religious Services: 1 to 4 times per year    Active Member of [employer] or Organizations: Yes    Attends Archivist Meetings: More than 4 times per year    Marital Status: Married  Human resources officer Violence: Not At Risk (11/28/2021)   Humiliation, Afraid,  Rape, and Kick questionnaire    Fear of Current or Ex-Partner: No    Emotionally Abused: No    Physically Abused: No    Sexually Abused: No    Functional Status Survey:    Family History  Problem Relation Age of Onset   Hypertension Mother    Heart disease Mother        CHF   Heart disease Father    Cancer Sister        melanoma    Health Maintenance  Topic Date Due   COVID-19 Vaccine (5 - Moderna risk series) 03/13/2020   INFLUENZA VACCINE  10/11/2021   TETANUS/TDAP  12/03/2031   Pneumonia Vaccine 41+ Years old  Completed   Zoster Vaccines- Shingrix  Completed   HPV VACCINES  Aged Out    Allergies  Allergen Reactions   Penicillins Rash and Other (See Comments)    Other reaction(s): Other (see comments) Other reaction(s): UNKNOWN   Lexapro [Escitalopram] Other (See Comments)    Increased hand tremor    Allergies as of 12/09/2021       Reactions   Penicillins Rash, Other (See Comments)   Other reaction(s): Other (see comments) Other reaction(s): UNKNOWN   Lexapro [escitalopram] Other (See Comments)   Increased hand tremor        Medication List        Accurate as of December 09, 2021  4:53 PM. If you have any questions, ask your nurse or doctor.          STOP taking these medications    Alpha-Lipoic Acid 600 MG Caps Stopped by: Dewayne Shorter, MD   ascorbic Acid 500 MG Cpcr Commonly known as: VITAMIN C Stopped by: Dewayne Shorter, MD   buPROPion 150 MG 24 hr tablet Commonly known as: WELLBUTRIN XL Replaced by: buPROPion 75 MG tablet Stopped by: Dewayne Shorter, MD   Coenzyme Q10 100 MG capsule Stopped by: Dewayne Shorter, MD   OVER THE [place] by: [place]  Amori Colomb, MD   Restasis 0.05 % ophthalmic emulsion Generic drug: cycloSPORINE Stopped by: Dewayne Shorter, MD   traMADol 50 MG tablet Commonly known as: ULTRAM Stopped by: Dewayne Shorter, MD   Turmeric 400 MG Caps Stopped by: Dewayne Shorter, MD        TAKE these medications    buPROPion 75 MG tablet Commonly known as: WELLBUTRIN Take 1 tablet (75 mg total) by mouth 2 (two) times daily. Replaces: buPROPion 150 MG 24 hr tablet Started by: Dewayne Shorter, MD   cephALEXin 500 MG capsule Commonly known as: KEFLEX Take 1 capsule (500 mg total) by mouth 4 (four) times daily for 8 days.   cyanocobalamin 500 MCG tablet Commonly known as: VITAMIN B12 Take 1 tablet by mouth daily.   donepezil 10 MG tablet Commonly known as: ARICEPT Take 5 mg by mouth every morning.   ezetimibe 10 MG tablet Commonly known as: ZETIA TAKE 1 TABLET BY MOUTH EVERY DAY   hydrocortisone 1 % ointment Apply 1 Application topically 2 (two) times daily. Started by: Dewayne Shorter, MD   levothyroxine 100 MCG tablet Commonly known as: SYNTHROID TAKE 1 TABLET BY MOUTH EVERY DAY BEFORE BREAKFAST   magnesium oxide 400 MG tablet Commonly known as: MAG-OX Take 400 mg by mouth daily.   MILK OF MAGNESIA PO Take 30 mLs by mouth every 12 (twelve) hours as needed.   neomycin-polymyxin-hydrocortisone 3.5-10000-1 OTIC suspension Commonly known as: CORTISPORIN Place 4 drops into the left ear 3 (three) times daily.   omeprazole 20 MG capsule Commonly known as: PRILOSEC Take 1 capsule by mouth at bedtime.   oxyCODONE 5 MG immediate release tablet Commonly known as: Roxicodone Take 1 tablet (5 mg total) by mouth every 6 (six) hours as needed for up to 7 days for breakthrough pain, severe pain or moderate pain.   Probiotic Acidophilus BioBeads Caps Take 250 mg by mouth daily.   Probiotic + Turmeric Extract 400 MG Caps Take by mouth.   rosuvastatin 10 MG tablet Commonly known as: CRESTOR Take 1 tablet (10 mg total) by mouth daily.   testosterone cypionate 200 MG/ML injection Commonly known as: DEPOTESTOSTERONE CYPIONATE INJECT 0.5 ML (100 MG TOTAL) INTO THE MUSCLE EVERY 14 DAYS.   Vitamin D3 25 MCG (1000 UT) Caps Take 2 capsules (2,000 Units total)  by mouth daily.        Review of Systems  Musculoskeletal:        Pain and swelling surrounding the wound.   All other systems reviewed and are negative.   Vitals:   12/09/21 1017  BP: (!) 150/60  Pulse: (!) 106  Resp: 20  Temp: (!) 96 F (35.6 C)  SpO2: 97%  Weight: 172 lb 3.2 oz (78.1 kg)  Height: '6\' 2"'$  (1.88 m)   Body mass index is 22.11 kg/m. Physical Exam Constitutional:      Comments: Keeps eyes closed periodically during evaluation.   HENT:     Head: Normocephalic and atraumatic.     Ears:     Comments: Hearing aids in place Eyes:     Comments: glasses  Cardiovascular:     Rate and Rhythm: Normal rate and regular rhythm.     Pulses: Normal pulses.  Pulmonary:     Breath sounds: Normal breath sounds.     Comments: Patient takes shallow rapid breaths.  Abdominal:     General: Abdomen is flat.     Palpations: Abdomen is soft.  Musculoskeletal:     Comments: 2+ pitting edema  of right leg and ankle  Neurological:     General: No focal deficit present.     Mental Status: He is alert and oriented to person, place, and time.    Labs reviewed: Basic Metabolic Panel: Recent Labs    11/30/21 0503 12/01/21 0528 12/02/21 0433 12/03/21 0734 12/05/21 0726  NA 137 137 139  --  138  K 3.7 3.6 3.8  --  3.9  CL 109 107 109  --  104  CO2 '23 24 25  '$ --  27  GLUCOSE 127* 111* 125*  --  97  BUN 28* 26* 28*  --  22  CREATININE 1.12 1.26* 1.13 1.14 1.14  CALCIUM 7.9* 8.2* 8.4*  --  8.4*  MG 1.9  --   --   --  2.1   Liver Function Tests: Recent Labs    08/26/21 1057 12/02/21 0433 12/05/21 0726  AST 18 20 57*  ALT 17 15 43  ALKPHOS 44 71 67  BILITOT 1.4* 0.5 0.7  PROT 6.2 5.5* 5.9*  ALBUMIN 4.1 2.4* 2.6*   No results for input(s): "LIPASE", "AMYLASE" in the last 8760 hours. No results for input(s): "AMMONIA" in the last 8760 hours. CBC: Recent Labs    01/03/21 1327 01/17/21 1457 08/26/21 1057 11/28/21 1204 12/01/21 1020 12/02/21 0433  12/05/21 0726  WBC 5.0 5.0 6.1   < > 8.1 7.4 6.2  NEUTROABS 3.5 3.3 4.0  --   --   --   --   HGB 15.2 14.9 15.1   < > 11.9* 11.5* 11.8*  HCT 43.7 43.8 44.7   < > 37.0* 35.4* 35.6*  MCV 96.7 94.3 100.9*   < > 96.6 94.4 93.2  PLT 206 243.0 217.0   < > 207 245 349   < > = values in this interval not displayed.   Cardiac Enzymes: Recent Labs    01/17/21 1457 12/01/21 0528 12/05/21 0726  CKTOTAL 92 81 278   BNP: Invalid input(s): "POCBNP" Lab Results  Component Value Date   HGBA1C 4.8 01/17/2021   Lab Results  Component Value Date   TSH 0.97 08/26/2021   Lab Results  Component Value Date   [id number] 875 02/03/2015   No results found for: "FOLATE" Lab Results  Component Value Date   IRON 148 07/01/2014   TIBC 280 07/01/2014    Imaging and Procedures obtained prior to SNF admission: DG Tibia/Fibula Right  Result Date: 11/28/2021 CLINICAL DATA:  Cellulitis right lower leg. Infection. Leg injury a few days ago and had stitches placed. Bandage in place at this time. EXAM: RIGHT TIBIA AND FIBULA - 2 VIEW COMPARISON:  None Available. FINDINGS: Postsurgical changes of total right knee arthroplasty. The superior aspect of the distal femoral hardware is seen on lateral view only. No perihardware lucency is seen to indicate hardware failure or loosening. There are 2 small chronic ossicles at the superior aspect of the tibial tubercle. No overlying anterior soft tissue swelling. Mild distal medial and lateral malleolar degenerative osteophytes at the ankle. No cortical erosion. No acute fracture is seen. No dislocation. Mild vascular calcifications. IMPRESSION: 1. Status post total right knee arthroplasty. No evidence of hardware failure. 2. No cortical erosion to indicate radiographic evidence of acute osteomyelitis. Electronically Signed   By: Yvonne Kendall M.D.   On: 11/28/2021 13:41    Assessment/Plan 1. Cellulitis of right leg Patient with recent admission for sepsis due to  infection.  Status post debridement.  Wound care includes moistened 4  x 4 covered by dry 4 x 4 covered by ABD pad surrounded in Kerlix and taped.  Patient continues to have significant pain and inability for full extension and has adequate baseline ambulation.  Requires skilled level care for wound management and physical therapy evaluation and treatment.  We will continue oxycodone 5 mg every 6 as needed for pain control in the setting of acute pain for 7 days.  We will continue Keflex until October 5.  2. Contact dermatitis and eczema due to detergents Patient noted to have contact dermatitis of the entire back photos noted under the media tab.  We will start hydrocortisone 1% twice daily for 7 days.  3. Major depressive disorder in full remission, unspecified whether recurrent [facility]) Patient with significant history of depression however in current remission.  Patient states much of his depression is due to the loss of his brother in 37 and since finding there remains he has had uplifted mood.  Given number of falls in the last 6 months we will plan for dose reduction of Wellbutrin to 75 mg twice daily with a goal of discontinuation in the future.  Patient was previously on Zoloft with good effect.  Will defer to primary care provider regarding neck steps and potential treatment of disease.  4. DOE (dyspnea on exertion) Patient states is a longstanding issue for which she has had cardiac and pulmonary evaluation.  No medications at this time we will continue to monitor.  No oxygen requirement at this time.  5. Depression with anxiety Patient with history of depression and anxiety for which he is taking Wellbutrin 75 mg twice daily.  6. Mild cognitive impairment with memory loss Patient and family have noted some cognitive changes in the last few years.  Most recent slums score was 28 out of 30 but this was in December 2020.  Some concern that mild cognitive impairment has progressed we will plan  on having evaluation with a slums or [place] during his stay or shortly after discharge.  Patient was previously started on donepezil which seems to be for bad dreams.  He continues to have vivid dreams, given concern for falls will put to discontinue meds.  At this time we will dose reduce donepezil to 5 mg daily.  7. Chronic kidney disease, stage 3b ([place]) Most recent labs with normal GFR greater than 60.  We will repeat labs as indicated.  8. Chronic obstructive pulmonary disease, unspecified COPD type ([place]) Patient currently without oxygen or inhalers we will continue to monitor for symptoms.  9. PAD (peripheral artery disease) ([place]) Patient's history of peripheral artery disease will likely impact his overall healing.  2+ pulses in bilateral lower extremities.  We will continue to monitor for worsening disease.  10. Other specified hypothyroidism Continue Synthroid 100 mcg daily.     Family/ staff Communication: wife diane and nursing staff  Labs/tests ordered: none  I spent greater than 1 hour in chart review, face-to-face evaluation, family discussion, documentation regarding care plan for this patient.  Tomasa Rand, MD, [facility] [phone]

## 2021-12-12 ENCOUNTER — Ambulatory Visit (INDEPENDENT_AMBULATORY_CARE_PROVIDER_SITE_OTHER): Payer: PPO | Admitting: Nurse Practitioner

## 2021-12-12 ENCOUNTER — Encounter (INDEPENDENT_AMBULATORY_CARE_PROVIDER_SITE_OTHER): Payer: PPO

## 2021-12-12 NOTE — Telephone Encounter (Signed)
noted 

## 2021-12-13 ENCOUNTER — Encounter: Payer: PPO | Admitting: Speech Pathology

## 2021-12-15 ENCOUNTER — Encounter: Payer: Self-pay | Admitting: Nurse Practitioner

## 2021-12-15 ENCOUNTER — Non-Acute Institutional Stay (SKILLED_NURSING_FACILITY): Payer: PPO | Admitting: Nurse Practitioner

## 2021-12-15 ENCOUNTER — Ambulatory Visit: Payer: PPO | Attending: Infectious Diseases | Admitting: Infectious Diseases

## 2021-12-15 ENCOUNTER — Encounter: Payer: PPO | Admitting: Speech Pathology

## 2021-12-15 DIAGNOSIS — E440 Moderate protein-calorie malnutrition: Secondary | ICD-10-CM | POA: Diagnosis not present

## 2021-12-15 DIAGNOSIS — S81811D Laceration without foreign body, right lower leg, subsequent encounter: Secondary | ICD-10-CM | POA: Insufficient documentation

## 2021-12-15 DIAGNOSIS — F325 Major depressive disorder, single episode, in full remission: Secondary | ICD-10-CM | POA: Diagnosis not present

## 2021-12-15 DIAGNOSIS — I10 Essential (primary) hypertension: Secondary | ICD-10-CM | POA: Diagnosis not present

## 2021-12-15 DIAGNOSIS — G3184 Mild cognitive impairment, so stated: Secondary | ICD-10-CM

## 2021-12-15 DIAGNOSIS — F5101 Primary insomnia: Secondary | ICD-10-CM

## 2021-12-15 DIAGNOSIS — Z09 Encounter for follow-up examination after completed treatment for conditions other than malignant neoplasm: Secondary | ICD-10-CM | POA: Insufficient documentation

## 2021-12-15 DIAGNOSIS — A4901 Methicillin susceptible Staphylococcus aureus infection, unspecified site: Secondary | ICD-10-CM | POA: Diagnosis not present

## 2021-12-15 MED ORDER — LOSARTAN POTASSIUM 25 MG PO TABS: 25.0000 mg | ORAL_TABLET | Freq: Every day | ORAL | Status: DC

## 2021-12-15 MED ORDER — MIRTAZAPINE 15 MG PO TABS: 15.0000 mg | ORAL_TABLET | Freq: Every day | ORAL | Status: DC

## 2021-12-15 NOTE — Progress Notes (Signed)
Location:  Other [facility]) Nursing Home Room Number: 102-A Place of Service:  SNF [phone]) Provider:  Carlos American. Dewaine Oats, NP   Patient Care Team: [name], MD as PCP - General (Internal Medicine) Minna Merritts, MD as PCP - Cardiology (Cardiology)  Extended Emergency Contact Information Primary Emergency Contact: Docter,[name] Address: [address]          [place], [place] [address] Johnnette Litter of [place] Phone: [phone] Mobile Phone: [phone] Relation: Spouse  Code Status:  Full Code  Goals of care: Advanced Directive information    12/15/2021    2:47 PM  Advanced Directives  Does Patient Have a Medical Advance Directive? Yes  Type of Advance Directive [place]  Does patient want to make changes to medical advance directive? No - Patient declined  Copy of [place] in Chart? Yes - validated most recent copy scanned in chart (See row information)     Chief Complaint  Patient presents with   Acute Visit    Depression     HPI:  Pt is a 88 y.o. male seen today for an acute visit for worsening anxiety and depression He reports being very frustrated about being in skilled care.  Wants to go home to be able to stay with his wife, not being able to be with her makes him very anxious. He was previously on zoloft but this has been stopped.   Blood pressure has been high. Pt reports recent readings have stayed up and he has never had to be on blood pressure medications before. No dietary changes. Does not add salt.   Reports appetite has been poor and wife bringing him protein supplement that he likes.   Poor sleep. Also reports vivid terrorizing dreams - feels like this is due to a medication.      Past Medical History:  Diagnosis Date   Actinic keratosis    Arthritis    knees   Asymptomatic Sinus bradycardia    a. 05/2016 Zio: Avg HR 61 (41-167).   Benign essential tremor    Cancer ([place]) 2002   melanoma  right ear   Colon polyp    COPD (chronic obstructive pulmonary disease) ([place])    pt said he believes it is a misdiagnosis    Coronary artery calcification seen on CT scan    a. 06/2016 CTA Chest: cor Ca2+; b. 05/2017 MV: EF >65%. No ischemia/infarct.   Depression    History of echocardiogram    a. 04/2016 Echo: EF 60-65%, mild conc LVH. Nl PASP.   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 05/2016 Zio: Avg HR 61 (41-167). 9 SVT runs (fastest 167 - 5 beats; longest 8 beats - 101 bpm). Rare PACs/PVCs.   PVC's (premature ventricular contractions)    Rhinitis    Wears dentures    partial upper and lower   Wears hearing aid in both ears    Past Surgical History:  Procedure Laterality Date   ADENOIDECTOMY     CATARACT EXTRACTION W/PHACO Left 10/22/2017   Procedure: CATARACT EXTRACTION PHACO AND INTRAOCULAR LENS PLACEMENT (IOC)  LEFT;  Surgeon: Eulogio Bear, MD;  Location: [place];  Service: Ophthalmology;  Laterality: Left;   CATARACT EXTRACTION W/PHACO Right 11/20/2017   Procedure: CATARACT EXTRACTION PHACO AND INTRAOCULAR LENS PLACEMENT (IOC) RIGHT;  Surgeon: Eulogio Bear, MD;  Location: [place];  Service: Ophthalmology;  Laterality: Right;   ESOPHAGOGASTRODUODENOSCOPY (EGD) WITH PROPOFOL N/A 10/31/2016   Procedure: ESOPHAGOGASTRODUODENOSCOPY (EGD) WITH PROPOFOL;  Surgeon: Lollie Sails, MD;  Location: [facility] END[facility];  Service: Endoscopy;  Laterality: N/A;   I & D EXTREMITY Right 11/29/2021   Procedure: IRRIGATION AND DEBRIDEMENT EXTREMITY;  Surgeon: Olean Ree, MD;  Location: [facility] ORS;  Service: General;  Laterality: Right;   JOINT REPLACEMENT Right 2003   knee   knee meniscus repair  Right    MELANOMA EXCISION Right    ear. Followed by Dr. Nehemiah Massed   PATELLECTOMY Bilateral    TONSILLECTOMY      Allergies  Allergen Reactions   Penicillins Rash and Other (See Comments)    Other reaction(s): Other (see comments) Other reaction(s): UNKNOWN   Lexapro [Escitalopram] Other (See Comments)    Increased hand tremor    Outpatient Encounter Medications as of 12/15/2021  Medication Sig   acetaminophen (TYLENOL) 500 MG tablet Take 1,000 mg by mouth daily. As needed for pain r/t wound care give 30 min prior to wound change   ascorbic acid (VITAMIN C) 500 MG tablet Take 500 mg by mouth daily.   buPROPion (WELLBUTRIN SR) 150 MG 12 hr tablet Take 150 mg by mouth daily.   cephALEXin (KEFLEX) 500 MG capsule Take 500 mg by mouth 4 (four) times daily. For 2 weeks for wound healing   cetaphil (CETAPHIL) lotion Apply 1 Application topically every 8 (eight) hours as needed for dry skin (For itchiness).   chlorhexidine (HIBICLENS) 4 % external liquid Apply 1 Application topically as directed. Apply to Right lower leg topically every day shift for use to clean and flush wound for 7 Days   Cholecalciferol (VITAMIN D3) 50 MCG (2000 UT) TABS Take 1 tablet by mouth daily.   cycloSPORINE (RESTASIS) 0.05 % ophthalmic emulsion Place 1 drop into both eyes 2 (two) times daily.   donepezil (ARICEPT) 10 MG tablet Take 10 mg by mouth every morning.   ezetimibe (ZETIA) 10 MG tablet TAKE 1 TABLET BY MOUTH EVERY DAY   levothyroxine (SYNTHROID) 100 MCG tablet TAKE 1 TABLET BY MOUTH EVERY DAY BEFORE BREAKFAST   magnesium oxide (MAG-OX) 400 MG tablet Take 400 mg by mouth daily.   mupirocin ointment (BACTROBAN) 2 % Apply 1 Application topically 2 (two) times daily. For wound healing for 7 days   rosuvastatin (CRESTOR) 10 MG tablet Take 1 tablet (10 mg total) by mouth daily.   Saccharomyces boulardii (PROBIOTIC) 250 MG CAPS Take 1 capsule by mouth daily at 12 noon.   senna (SENOKOT) 8.6 MG TABS tablet Take 2  tablets by mouth at bedtime. For bowel regimen   testosterone cypionate (DEPOTESTOSTERONE CYPIONATE) 200 MG/ML injection INJECT 0.5 ML (100 MG TOTAL) INTO THE MUSCLE EVERY 14 DAYS.   vitamin B-12 (CYANOCOBALAMIN) 500 MCG tablet Take 1 tablet by mouth daily.   Witch Hazel (PREPARATION H SOOTHING RELIEF EX) Apply topically. Apply to Rectal area topically every 24 hours as needed for discomfort   [DISCONTINUED] ascorbic Acid (VITAMIN C) 500 MG CPCR Take 500 mg by mouth.   [DISCONTINUED] buPROPion (WELLBUTRIN) 75 MG tablet Take 1 tablet (75 mg total) by mouth 2 (two) times daily.   [DISCONTINUED] cephALEXin (KEFLEX)  500 MG capsule Take 1 capsule (500 mg total) by mouth 4 (four) times daily for 8 days.   [DISCONTINUED] Cholecalciferol (VITAMIN D3) 25 MCG (1000 UT) CAPS Take 2 capsules (2,000 Units total) by mouth daily.   [DISCONTINUED] hydrocortisone 1 % ointment Apply 1 Application topically 2 (two) times daily.   [DISCONTINUED] Magnesium Hydroxide (MILK OF MAGNESIA PO) Take 30 mLs by mouth every 12 (twelve) hours as needed.   [DISCONTINUED] neomycin-polymyxin-hydrocortisone (CORTISPORIN) 3.5-10000-1 OTIC suspension Place 4 drops into the left ear 3 (three) times daily.   [DISCONTINUED] omeprazole (PRILOSEC) 20 MG capsule Take 1 capsule by mouth at bedtime. (Patient not taking: Reported on 12/15/2021)   [DISCONTINUED] oxyCODONE (ROXICODONE) 5 MG immediate release tablet Take 1 tablet (5 mg total) by mouth every 6 (six) hours as needed for up to 7 days for breakthrough pain, severe pain or moderate pain.   [DISCONTINUED] Probiotic Product (PROBIOTIC + TURMERIC EXTRACT) 400 MG CAPS Take by mouth.   [DISCONTINUED] Probiotic Product (PROBIOTIC ACIDOPHILUS) CAPS Take 250 mg by mouth daily. (Patient not taking: Reported on 12/15/2021)   No facility-administered encounter medications on file as of 12/15/2021.    Review of Systems  Constitutional:  Positive for appetite change and unexpected weight change.  Negative for activity change and fatigue.  HENT:  Negative for congestion and hearing loss.   Eyes: Negative.   Respiratory:  Negative for cough and shortness of breath.   Cardiovascular:  Negative for chest pain, palpitations and leg swelling.  Gastrointestinal:  Negative for abdominal pain, constipation and diarrhea.  Genitourinary:  Negative for difficulty urinating and dysuria.  Musculoskeletal:  Negative for arthralgias and myalgias.  Skin:  Positive for wound. Negative for color change.  Neurological:  Negative for dizziness and weakness.  Psychiatric/Behavioral:  Positive for agitation and sleep disturbance. Negative for behavioral problems and confusion.     Immunization History  Administered Date(s) Administered   Fluad Quad(high Dose 65+) 12/13/2018, 12/21/2020   Influenza,inj,Quad PF,6+ Mos 01/14/2014, 01/25/2018   Influenza-Unspecified 12/11/2012, 12/26/2019   Moderna Sars-Covid-2 Vaccination 04/25/2019, 05/16/2019, 06/26/2019, 01/17/2020   Pneumococcal Conjugate-13 01/14/2014, 10/04/2018, 12/13/2018   Pneumococcal Polysaccharide-23 04/28/2012   Td 12/02/2021   Tdap 08/19/2014   Zoster Recombinat (Shingrix) 03/12/2017, 09/19/2021   Zoster, Live 04/28/2010   Pertinent  Health Maintenance Due  Topic Date Due   INFLUENZA VACCINE  10/11/2021      12/05/2021    9:36 AM 12/05/2021   10:30 PM 12/06/2021    9:15 AM 12/06/2021   10:20 PM 12/07/2021   10:00 AM  Fall Risk  Patient Fall Risk Level High fall risk High fall risk High fall risk High fall risk High fall risk   Functional Status Survey:    Vitals:   12/15/21 1445  BP: (!) 176/75  Pulse: 61  Resp: 16  Temp: (!) 96 F (35.6 C)  SpO2: 99%  Weight: 174 lb (78.9 kg)  Height: '6\' 2"'$  (1.88 m)   Body mass index is 22.34 kg/m. Physical Exam Constitutional:      General: He is not in acute distress.    Appearance: He is well-developed. He is not diaphoretic.  HENT:     Head: Normocephalic and atraumatic.      Right Ear: External ear normal.     Left Ear: External ear normal.     Mouth/Throat:     Pharynx: No oropharyngeal exudate.  Eyes:     Conjunctiva/sclera: Conjunctivae normal.     Pupils: Pupils are equal, round, and reactive  to light.  Cardiovascular:     Rate and Rhythm: Normal rate and regular rhythm.     Heart sounds: Normal heart sounds.  Pulmonary:     Effort: Pulmonary effort is normal.     Breath sounds: Normal breath sounds.  Abdominal:     General: Bowel sounds are normal.     Palpations: Abdomen is soft.  Musculoskeletal:        General: No tenderness.     Cervical back: Normal range of motion and neck supple.     Right lower leg: No edema.     Left lower leg: No edema.  Skin:    General: Skin is warm and dry.  Neurological:     Mental Status: He is alert and oriented to person, place, and time. Mental status is at baseline.     Labs reviewed: Recent Labs    11/30/21 0503 12/01/21 0528 12/02/21 0433 12/03/21 0734 12/05/21 0726  NA 137 137 139  --  138  K 3.7 3.6 3.8  --  3.9  CL 109 107 109  --  104  CO2 '23 24 25  '$ --  27  GLUCOSE 127* 111* 125*  --  97  BUN 28* 26* 28*  --  22  CREATININE 1.12 1.26* 1.13 1.14 1.14  CALCIUM 7.9* 8.2* 8.4*  --  8.4*  MG 1.9  --   --   --  2.1   Recent Labs    08/26/21 1057 12/02/21 0433 12/05/21 0726  AST 18 20 57*  ALT 17 15 43  ALKPHOS 44 71 67  BILITOT 1.4* 0.5 0.7  PROT 6.2 5.5* 5.9*  ALBUMIN 4.1 2.4* 2.6*   Recent Labs    01/03/21 1327 01/17/21 1457 08/26/21 1057 11/28/21 1204 12/01/21 1020 12/02/21 0433 12/05/21 0726  WBC 5.0 5.0 6.1   < > 8.1 7.4 6.2  NEUTROABS 3.5 3.3 4.0  --   --   --   --   HGB 15.2 14.9 15.1   < > 11.9* 11.5* 11.8*  HCT 43.7 43.8 44.7   < > 37.0* 35.4* 35.6*  MCV 96.7 94.3 100.9*   < > 96.6 94.4 93.2  PLT 206 243.0 217.0   < > 207 245 349   < > = values in this interval not displayed.   Lab Results  Component Value Date   TSH 0.97 08/26/2021   Lab Results  Component  Value Date   HGBA1C 4.8 01/17/2021   Lab Results  Component Value Date   CHOL 197 09/21/2020   HDL 62.00 09/21/2020   LDLCALC 121 (H) 09/21/2020   TRIG 70.0 09/21/2020   CHOLHDL 3 09/21/2020    Significant Diagnostic Results in last 30 days:  CT FEMUR RIGHT W CONTRAST  Result Date: 12/03/2021 CLINICAL DATA:  Soft tissue infection.  Follow-up fluid collections. EXAM: CT OF THE LOWER RIGHT EXTREMITY WITH CONTRAST TECHNIQUE: Multidetector CT imaging of the lower right extremity was performed according to the standard protocol following intravenous contrast administration. RADIATION DOSE REDUCTION: This exam was performed according to the departmental dose-optimization program which includes automated exposure control, adjustment of the mA and/or kV according to patient size and/or use of iterative reconstruction technique. CONTRAST:  177m OMNIPAQUE IOHEXOL 300 MG/ML  SOLN COMPARISON:  CT 12/01/2021 FINDINGS: RIGHT FEMUR: Bones/Joint/Cartilage No acute fracture or dislocation. No erosion or periosteal elevation. Similar degenerative changes of the right hip. No appreciable right hip joint effusion. No lytic or sclerotic bone lesion. Ligaments Suboptimally assessed  by CT. Muscles and Tendons Similar degree of thickening in the region of the distal quadriceps tendon. No intramuscular fluid collections. Soft tissues Complete interval resolution of the previously described fluid within the medial compartment of the thigh extending along the course of the right gracilis muscle. Subcutaneous edema and ill-defined fluid along the medial and lateral aspects of the right thigh is similar to the previous study. No organized or rim enhancing fluid collection. No soft tissue gas. No right inguinal lymphadenopathy. Small bilateral hydroceles. Urinary bladder appears circumferentially thickened. RIGHT TIBIA-FIBULA: Bones/Joint/Cartilage No acute fracture. No dislocation. No site of erosion or periosteal elevation. The  anteromedial aspect of the mid tibial diaphysis is again seen exposed at site of soft tissue debridement within the lower leg. Grossly intact right total knee arthroplasty hardware. Ligaments Suboptimally assessed by CT. Muscles and Tendons No acute musculotendinous abnormality by CT. No intramuscular fluid collection is seen. Soft tissues Wide soft tissue debridement site along the anteromedial aspect of the mid tibia with packing material. The degree of soft tissue edema of the lower leg has increased from prior. Surgical Penrose drain is again seen within the soft tissues medially. No soft tissue gas. No organized or rim enhancing fluid collections of the lower leg. RIGHT FOOT: Bones/Joint/Cartilage No acute fracture or dislocation. No site of erosion or periosteal elevation. Similar degree of degenerative changes of the foot. No significant joint effusion is seen. Ligaments Suboptimally assessed by CT. Muscles and Tendons No acute musculotendinous abnormality by CT. No large tenosynovial fluid collection. Soft tissues Circumferential edema of the ankle and dorsal foot. No organized or rim enhancing fluid collection. No soft tissue gas. IMPRESSION: 1. Complete interval resolution of previously described fluid within the medial compartment of the right thigh seen along the course of the right gracilis muscle. 2. Increased soft tissue edema of the right lower leg, nonspecific but could reflect worsening cellulitis. No evidence of abscess or soft tissue gas. 3. No acute osseous abnormality of the right lower extremity. 4. Circumferentially thickened appearance of the urinary bladder. Correlate with urinalysis. Electronically Signed   By: Davina Poke D.O.   On: 12/03/2021 17:22   CT TIBIA FIBULA RIGHT W CONTRAST  Result Date: 12/03/2021 CLINICAL DATA:  Soft tissue infection.  Follow-up fluid collections. EXAM: CT OF THE LOWER RIGHT EXTREMITY WITH CONTRAST TECHNIQUE: Multidetector CT imaging of the lower  right extremity was performed according to the standard protocol following intravenous contrast administration. RADIATION DOSE REDUCTION: This exam was performed according to the departmental dose-optimization program which includes automated exposure control, adjustment of the mA and/or kV according to patient size and/or use of iterative reconstruction technique. CONTRAST:  117m OMNIPAQUE IOHEXOL 300 MG/ML  SOLN COMPARISON:  CT 12/01/2021 FINDINGS: RIGHT FEMUR: Bones/Joint/Cartilage No acute fracture or dislocation. No erosion or periosteal elevation. Similar degenerative changes of the right hip. No appreciable right hip joint effusion. No lytic or sclerotic bone lesion. Ligaments Suboptimally assessed by CT. Muscles and Tendons Similar degree of thickening in the region of the distal quadriceps tendon. No intramuscular fluid collections. Soft tissues Complete interval resolution of the previously described fluid within the medial compartment of the thigh extending along the course of the right gracilis muscle. Subcutaneous edema and ill-defined fluid along the medial and lateral aspects of the right thigh is similar to the previous study. No organized or rim enhancing fluid collection. No soft tissue gas. No right inguinal lymphadenopathy. Small bilateral hydroceles. Urinary bladder appears circumferentially thickened. RIGHT TIBIA-FIBULA: Bones/Joint/Cartilage No acute fracture.  No dislocation. No site of erosion or periosteal elevation. The anteromedial aspect of the mid tibial diaphysis is again seen exposed at site of soft tissue debridement within the lower leg. Grossly intact right total knee arthroplasty hardware. Ligaments Suboptimally assessed by CT. Muscles and Tendons No acute musculotendinous abnormality by CT. No intramuscular fluid collection is seen. Soft tissues Wide soft tissue debridement site along the anteromedial aspect of the mid tibia with packing material. The degree of soft tissue edema  of the lower leg has increased from prior. Surgical Penrose drain is again seen within the soft tissues medially. No soft tissue gas. No organized or rim enhancing fluid collections of the lower leg. RIGHT FOOT: Bones/Joint/Cartilage No acute fracture or dislocation. No site of erosion or periosteal elevation. Similar degree of degenerative changes of the foot. No significant joint effusion is seen. Ligaments Suboptimally assessed by CT. Muscles and Tendons No acute musculotendinous abnormality by CT. No large tenosynovial fluid collection. Soft tissues Circumferential edema of the ankle and dorsal foot. No organized or rim enhancing fluid collection. No soft tissue gas. IMPRESSION: 1. Complete interval resolution of previously described fluid within the medial compartment of the right thigh seen along the course of the right gracilis muscle. 2. Increased soft tissue edema of the right lower leg, nonspecific but could reflect worsening cellulitis. No evidence of abscess or soft tissue gas. 3. No acute osseous abnormality of the right lower extremity. 4. Circumferentially thickened appearance of the urinary bladder. Correlate with urinalysis. Electronically Signed   By: Davina Poke D.O.   On: 12/03/2021 17:22   CT FOOT RIGHT W CONTRAST  Result Date: 12/03/2021 CLINICAL DATA:  Soft tissue infection.  Follow-up fluid collections. EXAM: CT OF THE LOWER RIGHT EXTREMITY WITH CONTRAST TECHNIQUE: Multidetector CT imaging of the lower right extremity was performed according to the standard protocol following intravenous contrast administration. RADIATION DOSE REDUCTION: This exam was performed according to the departmental dose-optimization program which includes automated exposure control, adjustment of the mA and/or kV according to patient size and/or use of iterative reconstruction technique. CONTRAST:  170m OMNIPAQUE IOHEXOL 300 MG/ML  SOLN COMPARISON:  CT 12/01/2021 FINDINGS: RIGHT FEMUR: Bones/Joint/Cartilage  No acute fracture or dislocation. No erosion or periosteal elevation. Similar degenerative changes of the right hip. No appreciable right hip joint effusion. No lytic or sclerotic bone lesion. Ligaments Suboptimally assessed by CT. Muscles and Tendons Similar degree of thickening in the region of the distal quadriceps tendon. No intramuscular fluid collections. Soft tissues Complete interval resolution of the previously described fluid within the medial compartment of the thigh extending along the course of the right gracilis muscle. Subcutaneous edema and ill-defined fluid along the medial and lateral aspects of the right thigh is similar to the previous study. No organized or rim enhancing fluid collection. No soft tissue gas. No right inguinal lymphadenopathy. Small bilateral hydroceles. Urinary bladder appears circumferentially thickened. RIGHT TIBIA-FIBULA: Bones/Joint/Cartilage No acute fracture. No dislocation. No site of erosion or periosteal elevation. The anteromedial aspect of the mid tibial diaphysis is again seen exposed at site of soft tissue debridement within the lower leg. Grossly intact right total knee arthroplasty hardware. Ligaments Suboptimally assessed by CT. Muscles and Tendons No acute musculotendinous abnormality by CT. No intramuscular fluid collection is seen. Soft tissues Wide soft tissue debridement site along the anteromedial aspect of the mid tibia with packing material. The degree of soft tissue edema of the lower leg has increased from prior. Surgical Penrose drain is again seen within the  soft tissues medially. No soft tissue gas. No organized or rim enhancing fluid collections of the lower leg. RIGHT FOOT: Bones/Joint/Cartilage No acute fracture or dislocation. No site of erosion or periosteal elevation. Similar degree of degenerative changes of the foot. No significant joint effusion is seen. Ligaments Suboptimally assessed by CT. Muscles and Tendons No acute musculotendinous  abnormality by CT. No large tenosynovial fluid collection. Soft tissues Circumferential edema of the ankle and dorsal foot. No organized or rim enhancing fluid collection. No soft tissue gas. IMPRESSION: 1. Complete interval resolution of previously described fluid within the medial compartment of the right thigh seen along the course of the right gracilis muscle. 2. Increased soft tissue edema of the right lower leg, nonspecific but could reflect worsening cellulitis. No evidence of abscess or soft tissue gas. 3. No acute osseous abnormality of the right lower extremity. 4. Circumferentially thickened appearance of the urinary bladder. Correlate with urinalysis. Electronically Signed   By: Davina Poke D.O.   On: 12/03/2021 17:22   CT FEMUR RIGHT W CONTRAST  Result Date: 12/01/2021 CLINICAL DATA:  Soft tissue infection suspected, thigh, xray done RLE abscess, s/p debridement, now with worsening erythema extending up medial right leg EXAM: CT OF THE LOWER RIGHT EXTREMITY WITH CONTRAST TECHNIQUE: Multidetector CT imaging of the lower right femur, tibia-fibula, and foot were performed according to the standard protocol following intravenous contrast administration. RADIATION DOSE REDUCTION: This exam was performed according to the departmental dose-optimization program which includes automated exposure control, adjustment of the mA and/or kV according to patient size and/or use of iterative reconstruction technique. CONTRAST:  179m OMNIPAQUE IOHEXOL 300 MG/ML  SOLN COMPARISON:  X-ray 11/28/2021 FINDINGS: RIGHT FEMUR: Bones/Joint/Cartilage No acute fracture. No dislocation. No erosion or periosteal elevation. Mild-moderate osteoarthritis of the right hip. No hip joint effusion. No lytic or sclerotic bone lesion. Ligaments Suboptimally assessed by CT. Muscles and Tendons No acute musculotendinous abnormality by CT. Thickened appearance of the distal quadriceps tendon, evaluation is largely obscured by streak  artifact from patient's knee arthroplasty. No intramuscular fluid collections are seen. Soft tissues Subcutaneous edema throughout the right thigh. There is fluid within the superficial aspect of the medial compartment of the proximal to mid right thigh extending along the posterior aspect of the gracilis muscle approximately 15 cm in length (series 7, image 165; series 9, image 113). No well-defined rim enhancement of this fluid. Elsewhere, no focal fluid collections within the thigh. No soft tissue gas. Atherosclerotic vascular calcifications. No right inguinal lymphadenopathy. Small amount of free fluid is seen within the pelvis. Small bilateral hydroceles. RIGHT TIBIA-FIBULA: Bones/Joint/Cartilage No acute fracture. No dislocation. No focal erosion or site of periosteal elevation. The anteromedial aspect of the mid tibial diaphysis appears to be exposed at the site of soft tissue debridement. Postsurgical changes from right total knee arthroplasty without periprosthetic lucency or fracture. Moderate-sized complex-appearing knee joint effusion, nonspecific. Ligaments Suboptimally assessed by CT. Muscles and Tendons No acute musculotendinous abnormality by CT. No intramuscular fluid collection is seen. Soft tissues Large area of soft tissue debridement overlying the anteromedial aspect of the mid tibia with packing material evident at the debridement site. A surgical drain is located within the soft tissues superomedial to the debridement site. Circumferential soft tissue edema with skin thickening and ill-defined fluid throughout the lower leg. No organized or rim enhancing fluid collections. No soft tissue gas. Atherosclerotic vascular calcifications. RIGHT FOOT: Bones/Joint/Cartilage No acute fracture or dislocation. No focal erosion or site of periosteal elevation. Moderate degenerative changes  of the foot, most pronounced at the first tarsometatarsal joint and within the interphalangeal joints of the toes.  Multiple hammertoe deformities. No large tibiotalar or subtalar joint effusion. Ligaments Suboptimally assessed by CT. Muscles and Tendons No acute musculotendinous abnormality by CT. No appreciable tenosynovial fluid collection. Soft tissues Circumferential edema at the ankle and extending to the dorsum of the foot. No organized or rim enhancing fluid collections. Prominent edema within Kager's fat. No site of wound or ulceration is seen. No soft tissue gas. IMPRESSION: 1. Large area of soft tissue debridement overlying the anteromedial aspect of the mid right tibia with packing material evident at the debridement site. The anteromedial aspect of the mid tibial diaphysis appears to be exposed at the site of soft tissue debridement without findings to suggest osteomyelitis. 2. Circumferential soft tissue edema with skin thickening and ill-defined fluid throughout the right lower leg, ankle, and dorsal foot suggestive of cellulitis. No organized or rim enhancing fluid collections identified. No soft tissue gas. 3. Soft tissue edema is also present to a lesser degree within the right thigh. There is fluid within the superficial aspect of the medial compartment of the proximal to mid right thigh extending along the posterior aspect of the gracilis muscle approximately 15 cm in length. No rim enhancement to suggest abscess at this time. 4. Moderate-sized complex-appearing knee joint effusion, nonspecific. Septic arthritis is not excluded. 5. No CT evidence of osteomyelitis of the femur, tibia, fibula, or foot. 6. Small amount of free fluid within the pelvis, nonspecific. Electronically Signed   By: Davina Poke D.O.   On: 12/01/2021 10:19   CT TIBIA FIBULA RIGHT W CONTRAST  Result Date: 12/01/2021 CLINICAL DATA:  Soft tissue infection suspected, thigh, xray done RLE abscess, s/p debridement, now with worsening erythema extending up medial right leg EXAM: CT OF THE LOWER RIGHT EXTREMITY WITH CONTRAST TECHNIQUE:  Multidetector CT imaging of the lower right femur, tibia-fibula, and foot were performed according to the standard protocol following intravenous contrast administration. RADIATION DOSE REDUCTION: This exam was performed according to the departmental dose-optimization program which includes automated exposure control, adjustment of the mA and/or kV according to patient size and/or use of iterative reconstruction technique. CONTRAST:  146m OMNIPAQUE IOHEXOL 300 MG/ML  SOLN COMPARISON:  X-ray 11/28/2021 FINDINGS: RIGHT FEMUR: Bones/Joint/Cartilage No acute fracture. No dislocation. No erosion or periosteal elevation. Mild-moderate osteoarthritis of the right hip. No hip joint effusion. No lytic or sclerotic bone lesion. Ligaments Suboptimally assessed by CT. Muscles and Tendons No acute musculotendinous abnormality by CT. Thickened appearance of the distal quadriceps tendon, evaluation is largely obscured by streak artifact from patient's knee arthroplasty. No intramuscular fluid collections are seen. Soft tissues Subcutaneous edema throughout the right thigh. There is fluid within the superficial aspect of the medial compartment of the proximal to mid right thigh extending along the posterior aspect of the gracilis muscle approximately 15 cm in length (series 7, image 165; series 9, image 113). No well-defined rim enhancement of this fluid. Elsewhere, no focal fluid collections within the thigh. No soft tissue gas. Atherosclerotic vascular calcifications. No right inguinal lymphadenopathy. Small amount of free fluid is seen within the pelvis. Small bilateral hydroceles. RIGHT TIBIA-FIBULA: Bones/Joint/Cartilage No acute fracture. No dislocation. No focal erosion or site of periosteal elevation. The anteromedial aspect of the mid tibial diaphysis appears to be exposed at the site of soft tissue debridement. Postsurgical changes from right total knee arthroplasty without periprosthetic lucency or fracture.  Moderate-sized complex-appearing knee joint effusion, nonspecific.  Ligaments Suboptimally assessed by CT. Muscles and Tendons No acute musculotendinous abnormality by CT. No intramuscular fluid collection is seen. Soft tissues Large area of soft tissue debridement overlying the anteromedial aspect of the mid tibia with packing material evident at the debridement site. A surgical drain is located within the soft tissues superomedial to the debridement site. Circumferential soft tissue edema with skin thickening and ill-defined fluid throughout the lower leg. No organized or rim enhancing fluid collections. No soft tissue gas. Atherosclerotic vascular calcifications. RIGHT FOOT: Bones/Joint/Cartilage No acute fracture or dislocation. No focal erosion or site of periosteal elevation. Moderate degenerative changes of the foot, most pronounced at the first tarsometatarsal joint and within the interphalangeal joints of the toes. Multiple hammertoe deformities. No large tibiotalar or subtalar joint effusion. Ligaments Suboptimally assessed by CT. Muscles and Tendons No acute musculotendinous abnormality by CT. No appreciable tenosynovial fluid collection. Soft tissues Circumferential edema at the ankle and extending to the dorsum of the foot. No organized or rim enhancing fluid collections. Prominent edema within Kager's fat. No site of wound or ulceration is seen. No soft tissue gas. IMPRESSION: 1. Large area of soft tissue debridement overlying the anteromedial aspect of the mid right tibia with packing material evident at the debridement site. The anteromedial aspect of the mid tibial diaphysis appears to be exposed at the site of soft tissue debridement without findings to suggest osteomyelitis. 2. Circumferential soft tissue edema with skin thickening and ill-defined fluid throughout the right lower leg, ankle, and dorsal foot suggestive of cellulitis. No organized or rim enhancing fluid collections identified. No  soft tissue gas. 3. Soft tissue edema is also present to a lesser degree within the right thigh. There is fluid within the superficial aspect of the medial compartment of the proximal to mid right thigh extending along the posterior aspect of the gracilis muscle approximately 15 cm in length. No rim enhancement to suggest abscess at this time. 4. Moderate-sized complex-appearing knee joint effusion, nonspecific. Septic arthritis is not excluded. 5. No CT evidence of osteomyelitis of the femur, tibia, fibula, or foot. 6. Small amount of free fluid within the pelvis, nonspecific. Electronically Signed   By: Davina Poke D.O.   On: 12/01/2021 10:19   CT FOOT RIGHT W CONTRAST  Result Date: 12/01/2021 CLINICAL DATA:  Soft tissue infection suspected, thigh, xray done RLE abscess, s/p debridement, now with worsening erythema extending up medial right leg EXAM: CT OF THE LOWER RIGHT EXTREMITY WITH CONTRAST TECHNIQUE: Multidetector CT imaging of the lower right femur, tibia-fibula, and foot were performed according to the standard protocol following intravenous contrast administration. RADIATION DOSE REDUCTION: This exam was performed according to the departmental dose-optimization program which includes automated exposure control, adjustment of the mA and/or kV according to patient size and/or use of iterative reconstruction technique. CONTRAST:  162m OMNIPAQUE IOHEXOL 300 MG/ML  SOLN COMPARISON:  X-ray 11/28/2021 FINDINGS: RIGHT FEMUR: Bones/Joint/Cartilage No acute fracture. No dislocation. No erosion or periosteal elevation. Mild-moderate osteoarthritis of the right hip. No hip joint effusion. No lytic or sclerotic bone lesion. Ligaments Suboptimally assessed by CT. Muscles and Tendons No acute musculotendinous abnormality by CT. Thickened appearance of the distal quadriceps tendon, evaluation is largely obscured by streak artifact from patient's knee arthroplasty. No intramuscular fluid collections are seen.  Soft tissues Subcutaneous edema throughout the right thigh. There is fluid within the superficial aspect of the medial compartment of the proximal to mid right thigh extending along the posterior aspect of the gracilis muscle approximately 15  cm in length (series 7, image 165; series 9, image 113). No well-defined rim enhancement of this fluid. Elsewhere, no focal fluid collections within the thigh. No soft tissue gas. Atherosclerotic vascular calcifications. No right inguinal lymphadenopathy. Small amount of free fluid is seen within the pelvis. Small bilateral hydroceles. RIGHT TIBIA-FIBULA: Bones/Joint/Cartilage No acute fracture. No dislocation. No focal erosion or site of periosteal elevation. The anteromedial aspect of the mid tibial diaphysis appears to be exposed at the site of soft tissue debridement. Postsurgical changes from right total knee arthroplasty without periprosthetic lucency or fracture. Moderate-sized complex-appearing knee joint effusion, nonspecific. Ligaments Suboptimally assessed by CT. Muscles and Tendons No acute musculotendinous abnormality by CT. No intramuscular fluid collection is seen. Soft tissues Large area of soft tissue debridement overlying the anteromedial aspect of the mid tibia with packing material evident at the debridement site. A surgical drain is located within the soft tissues superomedial to the debridement site. Circumferential soft tissue edema with skin thickening and ill-defined fluid throughout the lower leg. No organized or rim enhancing fluid collections. No soft tissue gas. Atherosclerotic vascular calcifications. RIGHT FOOT: Bones/Joint/Cartilage No acute fracture or dislocation. No focal erosion or site of periosteal elevation. Moderate degenerative changes of the foot, most pronounced at the first tarsometatarsal joint and within the interphalangeal joints of the toes. Multiple hammertoe deformities. No large tibiotalar or subtalar joint effusion. Ligaments  Suboptimally assessed by CT. Muscles and Tendons No acute musculotendinous abnormality by CT. No appreciable tenosynovial fluid collection. Soft tissues Circumferential edema at the ankle and extending to the dorsum of the foot. No organized or rim enhancing fluid collections. Prominent edema within Kager's fat. No site of wound or ulceration is seen. No soft tissue gas. IMPRESSION: 1. Large area of soft tissue debridement overlying the anteromedial aspect of the mid right tibia with packing material evident at the debridement site. The anteromedial aspect of the mid tibial diaphysis appears to be exposed at the site of soft tissue debridement without findings to suggest osteomyelitis. 2. Circumferential soft tissue edema with skin thickening and ill-defined fluid throughout the right lower leg, ankle, and dorsal foot suggestive of cellulitis. No organized or rim enhancing fluid collections identified. No soft tissue gas. 3. Soft tissue edema is also present to a lesser degree within the right thigh. There is fluid within the superficial aspect of the medial compartment of the proximal to mid right thigh extending along the posterior aspect of the gracilis muscle approximately 15 cm in length. No rim enhancement to suggest abscess at this time. 4. Moderate-sized complex-appearing knee joint effusion, nonspecific. Septic arthritis is not excluded. 5. No CT evidence of osteomyelitis of the femur, tibia, fibula, or foot. 6. Small amount of free fluid within the pelvis, nonspecific. Electronically Signed   By: Davina Poke D.O.   On: 12/01/2021 10:19   DG Tibia/Fibula Right  Result Date: 11/28/2021 CLINICAL DATA:  Cellulitis right lower leg. Infection. Leg injury a few days ago and had stitches placed. Bandage in place at this time. EXAM: RIGHT TIBIA AND FIBULA - 2 VIEW COMPARISON:  None Available. FINDINGS: Postsurgical changes of total right knee arthroplasty. The superior aspect of the distal femoral hardware  is seen on lateral view only. No perihardware lucency is seen to indicate hardware failure or loosening. There are 2 small chronic ossicles at the superior aspect of the tibial tubercle. No overlying anterior soft tissue swelling. Mild distal medial and lateral malleolar degenerative osteophytes at the ankle. No cortical erosion. No acute fracture  is seen. No dislocation. Mild vascular calcifications. IMPRESSION: 1. Status post total right knee arthroplasty. No evidence of hardware failure. 2. No cortical erosion to indicate radiographic evidence of acute osteomyelitis. Electronically Signed   By: Yvonne Kendall M.D.   On: 11/28/2021 13:41    Assessment/Plan 1. Major depressive disorder in full remission, unspecified whether recurrent ([place]) - will add mirtazapine (REMERON) 15 MG tablet; Take 1 tablet (15 mg total) by mouth at bedtime. To continue wellbutrin at this time Encouraged positivity journal   2. Mild cognitive impairment with memory loss -will dc aricept having very vivid terrifying dreams   3. Primary insomnia -will add Remeron to help with sleep.  - mirtazapine (REMERON) 15 MG tablet; Take 1 tablet (15 mg total) by mouth at bedtime.  4. Primary hypertension -elevated blood pressure, not controlled on readings from [facility], pt without headache, blurred vision or chest pains. He is not currently on medication -low sodium diet recommended - losartan (COZAAR) 25 MG tablet; Take 1 tablet (25 mg total) by mouth daily.  Dispense: 90 tablet  5. Protein-calorie malnutrition, moderate ([facility]) -continue supplement, will add remeron to help with mood and oral intake.  -needs to make sure nutrition is adequate for proper wound healing.  - mirtazapine (REMERON) 15 MG tablet; Take 1 tablet (15 mg total) by mouth at bedtime.  Carlos American. [place], [facility] [phone]

## 2021-12-15 NOTE — Progress Notes (Signed)
The purpose of this virtual visit is to provide medical care while limiting exposure to the novel coronavirus (COVID19) for both patient and office staff.   Consent was obtained for video  visit:  Yes.   Answered questions that patient had about telehealth interaction:  Yes.   I discussed the limitations, risks, security and privacy concerns of performing an evaluation and management service by telephone. I also discussed with the patient that there may be a patient responsible charge related to this service. The patient expressed understanding and agreed to proceed.   Patient Location: [facility] Provider Location: office On the visit- patient, his wife , nurse Shirlean Mylar and provider Follow up visit for recent hospitalization 11/28/21-12/07/21 for a rt shin wound which was infected with MSSA- he sustained the wound in his attic by scraping on a board- He underwent initally sutures for the laceration, and was admitted after a couple of days for redness and had to undergo surgery - the wound was debrided MSSA in culture- he initially received IV broad spectrum and then nnarrowed to IV cefazolin and was discharged on PO keflex '500mg'$  Po q6 He has been doing better he says No pain in the rt leg No redness Walking with PT Appetite fair ( but nurse says poor)  O/e thru the screen He looked good No distress Appropriate response to questions Rt knee TKA scar( old)  Flexing/extending knee well No swelling of the knee joint or erythema Wound rt shin examined No redness around 01/04/22   12/15/21   12/09/21   12/05/21   12/05/21   Compared to the previous images the wound is healing  He is getitng wet to dry dressing every day and hibiclens wash He has an appt to see the surgeon next week Plan CBC/CMP on Monday Continue keflex '500mg'$  Po q6 for 2 more weeks Will do a video follow up in 2 weeks Discussed the management with patient and his wife and his nurse Total time spent on this call  23 min Faxed the order for labs/continuation of antibiotic to [facility] at [phone] ( phone [phone])

## 2021-12-19 ENCOUNTER — Encounter: Payer: Self-pay | Admitting: Student

## 2021-12-19 ENCOUNTER — Non-Acute Institutional Stay (SKILLED_NURSING_FACILITY): Payer: PPO | Admitting: Student

## 2021-12-19 DIAGNOSIS — S81811D Laceration without foreign body, right lower leg, subsequent encounter: Secondary | ICD-10-CM | POA: Diagnosis not present

## 2021-12-19 NOTE — Progress Notes (Addendum)
Location:  Other [facility]) Nursing Home Room Number: 102-A Place of Service:  SNF [id number]) Provider:  Tomasa Rand, MD   Patient Care Team: [name], MD as PCP - General (Internal Medicine) Minna Merritts, MD as PCP - Cardiology (Cardiology)  Extended Emergency Contact Information Primary Emergency Contact: Mandley,[name] Address: [address]          [place],  [address] Johnnette Litter of [place] Phone: [phone] Mobile Phone: [phone] Relation: Spouse  Code Status:  Full Code  Goals of care: Advanced Directive information    12/19/2021    4:32 PM  Advanced Directives  Does Patient Have a Medical Advance Directive? Yes  Type of Advance Directive [place]  Does patient want to make changes to medical advance directive? No - Patient declined  Copy of [place] in Chart? Yes - validated most recent copy scanned in chart (See row information)     Chief Complaint  Patient presents with   Acute Visit    Wound check, vitals and medications are a reflection of [facility]MR system, [facility]      HPI:  Pt is a 87 y.o. male seen today for an acute visit for follow up of wound management. Patient's wound is improving. He has less pain in general. Would like to go home as soon as possible.    Past Medical History:  Diagnosis Date   Actinic keratosis    Arthritis    knees   Asymptomatic Sinus bradycardia    a. 05/2016 Zio: Avg HR 61 (41-167).   Benign essential tremor    Cancer ([place]) 2002   melanoma right ear   Colon polyp    COPD (chronic obstructive pulmonary disease) ([place])    pt said he believes it is a misdiagnosis    Coronary artery calcification seen on CT scan    a. 06/2016 CTA Chest: cor Ca2+; b. 05/2017 MV: EF >65%. No ischemia/infarct.   Depression    History of echocardiogram    a. 04/2016 Echo: EF 60-65%, mild conc LVH. Nl PASP.   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 05/2016 Zio: Avg HR 61 (41-167). 9 SVT runs (fastest 167 - 5 beats; longest 8 beats - 101 bpm). Rare PACs/PVCs.   PVC's (premature ventricular contractions)    Rhinitis    Wears dentures    partial upper and lower   Wears hearing aid in both ears    Past Surgical History:  Procedure Laterality Date   ADENOIDECTOMY     CATARACT EXTRACTION W/PHACO Left 10/22/2017   Procedure: CATARACT EXTRACTION PHACO AND INTRAOCULAR LENS PLACEMENT (IOC)  LEFT;  Surgeon: Eulogio Bear, MD;  Location: [facility];  Service: Ophthalmology;  Laterality: Left;   CATARACT EXTRACTION W/PHACO Right 11/20/2017   Procedure: CATARACT EXTRACTION PHACO AND INTRAOCULAR LENS PLACEMENT (IOC) RIGHT;  Surgeon: Eulogio Bear, MD;  Location: [facility];  Service: Ophthalmology;  Laterality: Right;   ESOPHAGOGASTRODUODENOSCOPY (EGD) WITH PROPOFOL N/A 10/31/2016   Procedure: ESOPHAGOGASTRODUODENOSCOPY (EGD) WITH PROPOFOL;  Surgeon: Lollie Sails, MD;  Location: [facility];  Service: Endoscopy;  Laterality: N/A;   I & D EXTREMITY Right 11/29/2021   Procedure: IRRIGATION AND DEBRIDEMENT EXTREMITY;  Surgeon: Olean Ree, MD;  Location: [facility];  Service: General;  Laterality: Right;   JOINT REPLACEMENT Right 2003   knee   knee meniscus repair Right    MELANOMA EXCISION Right    ear. Followed by Dr. Nehemiah Massed   PATELLECTOMY Bilateral    TONSILLECTOMY      Allergies  Allergen Reactions   Penicillins Rash and Other (See Comments)    Other reaction(s): Other (see comments) Other reaction(s): UNKNOWN   Lexapro [Escitalopram] Other (See Comments)    Increased hand tremor    Outpatient Encounter Medications as of 12/19/2021  Medication  Sig   acetaminophen (TYLENOL) 500 MG tablet Take 1,000 mg by mouth daily. As needed for pain r/t wound care give 30 min prior to wound change   ascorbic acid (VITAMIN C) 500 MG tablet Take 500 mg by mouth daily.   buPROPion (WELLBUTRIN SR) 150 MG 12 hr tablet Take 150 mg by mouth daily.   cephALEXin (KEFLEX) 500 MG capsule Take 500 mg by mouth 4 (four) times daily. For 2 weeks for wound healing   cetaphil (CETAPHIL) lotion Apply 1 Application topically every 8 (eight) hours as needed for dry skin (For itchiness).   chlorhexidine (HIBICLENS) 4 % external liquid Apply 1 Application topically as directed. Apply to Right lower leg topically every day shift for use to clean and flush wound for 7 Days   Cholecalciferol (VITAMIN D3) 50 MCG (2000 UT) TABS Take 1 tablet by mouth daily.   cycloSPORINE (RESTASIS) 0.05 % ophthalmic emulsion Place 1 drop into both eyes 2 (two) times daily.   ezetimibe (ZETIA) 10 MG tablet TAKE 1 TABLET BY MOUTH EVERY DAY   levothyroxine (SYNTHROID) 100 MCG tablet TAKE 1 TABLET BY MOUTH EVERY DAY BEFORE BREAKFAST   losartan (COZAAR) 25 MG tablet Take 1 tablet (25 mg total) by mouth daily.   magnesium oxide (MAG-OX) 400 MG tablet Take 400 mg by mouth daily.   mirtazapine (REMERON) 15 MG tablet Take 1 tablet (15 mg total) by mouth at bedtime.   rosuvastatin (CRESTOR) 10 MG tablet Take 1 tablet (10 mg total) by mouth daily.   Saccharomyces boulardii (PROBIOTIC) 250 MG CAPS Take 1 capsule by mouth daily at 12 noon.   senna (SENOKOT) 8.6 MG TABS tablet Take 2 tablets by mouth at bedtime. For bowel regimen   testosterone cypionate (DEPOTESTOSTERONE CYPIONATE) 200 MG/ML injection INJECT 0.5 ML (100 MG TOTAL) INTO THE MUSCLE EVERY 14 DAYS.   vitamin B-12 (CYANOCOBALAMIN) 500 MCG tablet Take 1 tablet by mouth daily.   Witch Hazel (PREPARATION H SOOTHING RELIEF EX) Apply topically. Apply to Rectal area topically every 24 hours as needed for discomfort   [DISCONTINUED] mupirocin ointment  (BACTROBAN) 2 % Apply 1 Application topically 2 (two) times daily. For wound healing for 7 days   No facility-administered encounter medications on file as of 12/19/2021.    Review of Systems  All other systems reviewed and are negative.   Immunization History  Administered Date(s) Administered   Fluad Quad(high Dose 65+) 12/13/2018, 12/21/2020   Influenza,inj,Quad PF,6+ Mos 01/14/2014, 01/25/2018   Influenza-Unspecified 12/11/2012, 12/26/2019   Moderna Sars-Covid-2 Vaccination 04/25/2019, 05/16/2019, 06/26/2019, 01/17/2020   Pneumococcal Conjugate-13 01/14/2014, 10/04/2018, 12/13/2018   Pneumococcal Polysaccharide-23 04/28/2012   Td 12/02/2021   Tdap 08/19/2014   Zoster Recombinat (Shingrix) 03/12/2017, 09/19/2021   Zoster, Live 04/28/2010   Pertinent  Health Maintenance Due  Topic Date Due   INFLUENZA VACCINE  10/11/2021      12/05/2021    9:36 AM 12/05/2021   10:30 PM 12/06/2021    9:15 AM 12/06/2021   10:20 PM 12/07/2021   10:00 AM  Fall Risk  Patient Fall Risk Level High fall risk High fall risk High fall risk High fall risk High fall risk   Functional Status Survey:    Vitals:   12/19/21 1626  BP: (!) 159/80  Pulse: 75  Resp: 20  Temp: (!) 96.3 F (35.7 C)  SpO2: 96%  Weight: 174 lb (78.9 kg)  Height: '6\' 2"'$  (1.88 m)   Body mass index is 22.34 kg/m. Physical Exam Constitutional:      Appearance: Normal appearance.  Neurological:     Mental Status: He is alert.      Labs reviewed: Recent Labs    11/30/21 0503 12/01/21 0528 12/02/21 0433 12/03/21 0734 12/05/21 0726  NA 137 137 139  --  138  K 3.7 3.6 3.8  --  3.9  CL 109 107 109  --  104  CO2 '23 24 25  '$ --  27  GLUCOSE 127* 111* 125*  --  97  BUN 28* 26* 28*  --  22  CREATININE 1.12 1.26* 1.13 1.14 1.14  CALCIUM 7.9* 8.2* 8.4*  --  8.4*  MG 1.9  --   --   --  2.1   Recent Labs    08/26/21 1057 12/02/21 0433 12/05/21 0726  AST 18 20 57*  ALT 17 15 43  ALKPHOS 44 71 67  BILITOT 1.4* 0.5  0.7  PROT 6.2 5.5* 5.9*  ALBUMIN 4.1 2.4* 2.6*   Recent Labs    01/03/21 1327 01/17/21 1457 08/26/21 1057 11/28/21 1204 12/01/21 1020 12/02/21 0433 12/05/21 0726  WBC 5.0 5.0 6.1   < > 8.1 7.4 6.2  NEUTROABS 3.5 3.3 4.0  --   --   --   --   HGB 15.2 14.9 15.1   < > 11.9* 11.5* 11.8*  HCT 43.7 43.8 44.7   < > 37.0* 35.4* 35.6*  MCV 96.7 94.3 100.9*   < > 96.6 94.4 93.2  PLT 206 243.0 217.0   < > 207 245 349   < > = values in this interval not displayed.   Lab Results  Component Value Date   TSH 0.97 08/26/2021   Lab Results  Component Value Date   HGBA1C 4.8 01/17/2021   Lab Results  Component Value Date   CHOL 197 09/21/2020   HDL 62.00 09/21/2020   LDLCALC 121 (H) 09/21/2020   TRIG 70.0 09/21/2020   CHOLHDL 3 09/21/2020    Significant Diagnostic Results in last 30 days:  CT FEMUR RIGHT W CONTRAST  Result Date: 12/03/2021 CLINICAL DATA:  Soft tissue infection.  Follow-up fluid collections. EXAM: CT OF THE LOWER RIGHT EXTREMITY WITH CONTRAST TECHNIQUE: Multidetector CT imaging of the lower right extremity was performed according to the standard protocol following intravenous contrast administration. RADIATION DOSE REDUCTION: This exam was performed according to the departmental dose-optimization program which includes automated exposure control, adjustment of the mA and/or kV according to patient size and/or use of iterative reconstruction technique. CONTRAST:  123m OMNIPAQUE IOHEXOL 300 MG/ML  SOLN COMPARISON:  CT 12/01/2021 FINDINGS: RIGHT FEMUR: Bones/Joint/Cartilage No acute fracture or dislocation. No erosion or periosteal elevation. Similar degenerative changes of the right hip. No appreciable right hip joint effusion. No lytic or sclerotic bone lesion.  Ligaments Suboptimally assessed by CT. Muscles and Tendons Similar degree of thickening in the region of the distal quadriceps tendon. No intramuscular fluid collections. Soft tissues Complete interval resolution of the  previously described fluid within the medial compartment of the thigh extending along the course of the right gracilis muscle. Subcutaneous edema and ill-defined fluid along the medial and lateral aspects of the right thigh is similar to the previous study. No organized or rim enhancing fluid collection. No soft tissue gas. No right inguinal lymphadenopathy. Small bilateral hydroceles. Urinary bladder appears circumferentially thickened. RIGHT TIBIA-FIBULA: Bones/Joint/Cartilage No acute fracture. No dislocation. No site of erosion or periosteal elevation. The anteromedial aspect of the mid tibial diaphysis is again seen exposed at site of soft tissue debridement within the lower leg. Grossly intact right total knee arthroplasty hardware. Ligaments Suboptimally assessed by CT. Muscles and Tendons No acute musculotendinous abnormality by CT. No intramuscular fluid collection is seen. Soft tissues Wide soft tissue debridement site along the anteromedial aspect of the mid tibia with packing material. The degree of soft tissue edema of the lower leg has increased from prior. Surgical Penrose drain is again seen within the soft tissues medially. No soft tissue gas. No organized or rim enhancing fluid collections of the lower leg. RIGHT FOOT: Bones/Joint/Cartilage No acute fracture or dislocation. No site of erosion or periosteal elevation. Similar degree of degenerative changes of the foot. No significant joint effusion is seen. Ligaments Suboptimally assessed by CT. Muscles and Tendons No acute musculotendinous abnormality by CT. No large tenosynovial fluid collection. Soft tissues Circumferential edema of the ankle and dorsal foot. No organized or rim enhancing fluid collection. No soft tissue gas. IMPRESSION: 1. Complete interval resolution of previously described fluid within the medial compartment of the right thigh seen along the course of the right gracilis muscle. 2. Increased soft tissue edema of the right lower  leg, nonspecific but could reflect worsening cellulitis. No evidence of abscess or soft tissue gas. 3. No acute osseous abnormality of the right lower extremity. 4. Circumferentially thickened appearance of the urinary bladder. Correlate with urinalysis. Electronically Signed   By: Davina Poke D.O.   On: 12/03/2021 17:22   CT TIBIA FIBULA RIGHT W CONTRAST  Result Date: 12/03/2021 CLINICAL DATA:  Soft tissue infection.  Follow-up fluid collections. EXAM: CT OF THE LOWER RIGHT EXTREMITY WITH CONTRAST TECHNIQUE: Multidetector CT imaging of the lower right extremity was performed according to the standard protocol following intravenous contrast administration. RADIATION DOSE REDUCTION: This exam was performed according to the departmental dose-optimization program which includes automated exposure control, adjustment of the mA and/or kV according to patient size and/or use of iterative reconstruction technique. CONTRAST:  174m OMNIPAQUE IOHEXOL 300 MG/ML  SOLN COMPARISON:  CT 12/01/2021 FINDINGS: RIGHT FEMUR: Bones/Joint/Cartilage No acute fracture or dislocation. No erosion or periosteal elevation. Similar degenerative changes of the right hip. No appreciable right hip joint effusion. No lytic or sclerotic bone lesion. Ligaments Suboptimally assessed by CT. Muscles and Tendons Similar degree of thickening in the region of the distal quadriceps tendon. No intramuscular fluid collections. Soft tissues Complete interval resolution of the previously described fluid within the medial compartment of the thigh extending along the course of the right gracilis muscle. Subcutaneous edema and ill-defined fluid along the medial and lateral aspects of the right thigh is similar to the previous study. No organized or rim enhancing fluid collection. No soft tissue gas. No right inguinal lymphadenopathy. Small bilateral hydroceles. Urinary bladder appears circumferentially thickened. RIGHT TIBIA-FIBULA: Bones/Joint/Cartilage  No acute fracture. No dislocation. No site of erosion or periosteal elevation. The anteromedial aspect of the mid tibial diaphysis is again seen exposed at site of soft tissue debridement within the lower leg. Grossly intact right total knee arthroplasty hardware. Ligaments Suboptimally assessed by CT. Muscles and Tendons No acute musculotendinous abnormality by CT. No intramuscular fluid collection is seen. Soft tissues Wide soft tissue debridement site along the anteromedial aspect of the mid tibia with packing material. The degree of soft tissue edema of the lower leg has increased from prior. Surgical Penrose drain is again seen within the soft tissues medially. No soft tissue gas. No organized or rim enhancing fluid collections of the lower leg. RIGHT FOOT: Bones/Joint/Cartilage No acute fracture or dislocation. No site of erosion or periosteal elevation. Similar degree of degenerative changes of the foot. No significant joint effusion is seen. Ligaments Suboptimally assessed by CT. Muscles and Tendons No acute musculotendinous abnormality by CT. No large tenosynovial fluid collection. Soft tissues Circumferential edema of the ankle and dorsal foot. No organized or rim enhancing fluid collection. No soft tissue gas. IMPRESSION: 1. Complete interval resolution of previously described fluid within the medial compartment of the right thigh seen along the course of the right gracilis muscle. 2. Increased soft tissue edema of the right lower leg, nonspecific but could reflect worsening cellulitis. No evidence of abscess or soft tissue gas. 3. No acute osseous abnormality of the right lower extremity. 4. Circumferentially thickened appearance of the urinary bladder. Correlate with urinalysis. Electronically Signed   By: Davina Poke D.O.   On: 12/03/2021 17:22   CT FOOT RIGHT W CONTRAST  Result Date: 12/03/2021 CLINICAL DATA:  Soft tissue infection.  Follow-up fluid collections. EXAM: CT OF THE LOWER RIGHT  EXTREMITY WITH CONTRAST TECHNIQUE: Multidetector CT imaging of the lower right extremity was performed according to the standard protocol following intravenous contrast administration. RADIATION DOSE REDUCTION: This exam was performed according to the departmental dose-optimization program which includes automated exposure control, adjustment of the mA and/or kV according to patient size and/or use of iterative reconstruction technique. CONTRAST:  173m OMNIPAQUE IOHEXOL 300 MG/ML  SOLN COMPARISON:  CT 12/01/2021 FINDINGS: RIGHT FEMUR: Bones/Joint/Cartilage No acute fracture or dislocation. No erosion or periosteal elevation. Similar degenerative changes of the right hip. No appreciable right hip joint effusion. No lytic or sclerotic bone lesion. Ligaments Suboptimally assessed by CT. Muscles and Tendons Similar degree of thickening in the region of the distal quadriceps tendon. No intramuscular fluid collections. Soft tissues Complete interval resolution of the previously described fluid within the medial compartment of the thigh extending along the course of the right gracilis muscle. Subcutaneous edema and ill-defined fluid along the medial and lateral aspects of the right thigh is similar to the previous study. No organized or rim enhancing fluid collection. No soft tissue gas. No right inguinal lymphadenopathy. Small bilateral hydroceles. Urinary bladder appears circumferentially thickened. RIGHT TIBIA-FIBULA: Bones/Joint/Cartilage No acute fracture. No dislocation. No site of erosion or periosteal elevation. The anteromedial aspect of the mid tibial diaphysis is again seen exposed at site of soft tissue debridement within the lower leg. Grossly intact right total knee arthroplasty hardware. Ligaments Suboptimally assessed by CT. Muscles and Tendons No acute musculotendinous abnormality by CT. No intramuscular fluid collection is seen. Soft tissues Wide soft tissue debridement site along the anteromedial aspect  of the mid tibia with packing material. The degree of soft tissue edema of the lower leg has increased from prior. Surgical Penrose drain is again  seen within the soft tissues medially. No soft tissue gas. No organized or rim enhancing fluid collections of the lower leg. RIGHT FOOT: Bones/Joint/Cartilage No acute fracture or dislocation. No site of erosion or periosteal elevation. Similar degree of degenerative changes of the foot. No significant joint effusion is seen. Ligaments Suboptimally assessed by CT. Muscles and Tendons No acute musculotendinous abnormality by CT. No large tenosynovial fluid collection. Soft tissues Circumferential edema of the ankle and dorsal foot. No organized or rim enhancing fluid collection. No soft tissue gas. IMPRESSION: 1. Complete interval resolution of previously described fluid within the medial compartment of the right thigh seen along the course of the right gracilis muscle. 2. Increased soft tissue edema of the right lower leg, nonspecific but could reflect worsening cellulitis. No evidence of abscess or soft tissue gas. 3. No acute osseous abnormality of the right lower extremity. 4. Circumferentially thickened appearance of the urinary bladder. Correlate with urinalysis. Electronically Signed   By: Davina Poke D.O.   On: 12/03/2021 17:22   CT FEMUR RIGHT W CONTRAST  Result Date: 12/01/2021 CLINICAL DATA:  Soft tissue infection suspected, thigh, xray done RLE abscess, s/p debridement, now with worsening erythema extending up medial right leg EXAM: CT OF THE LOWER RIGHT EXTREMITY WITH CONTRAST TECHNIQUE: Multidetector CT imaging of the lower right femur, tibia-fibula, and foot were performed according to the standard protocol following intravenous contrast administration. RADIATION DOSE REDUCTION: This exam was performed according to the departmental dose-optimization program which includes automated exposure control, adjustment of the mA and/or kV according to  patient size and/or use of iterative reconstruction technique. CONTRAST:  1106m OMNIPAQUE IOHEXOL 300 MG/ML  SOLN COMPARISON:  X-ray 11/28/2021 FINDINGS: RIGHT FEMUR: Bones/Joint/Cartilage No acute fracture. No dislocation. No erosion or periosteal elevation. Mild-moderate osteoarthritis of the right hip. No hip joint effusion. No lytic or sclerotic bone lesion. Ligaments Suboptimally assessed by CT. Muscles and Tendons No acute musculotendinous abnormality by CT. Thickened appearance of the distal quadriceps tendon, evaluation is largely obscured by streak artifact from patient's knee arthroplasty. No intramuscular fluid collections are seen. Soft tissues Subcutaneous edema throughout the right thigh. There is fluid within the superficial aspect of the medial compartment of the proximal to mid right thigh extending along the posterior aspect of the gracilis muscle approximately 15 cm in length (series 7, image 165; series 9, image 113). No well-defined rim enhancement of this fluid. Elsewhere, no focal fluid collections within the thigh. No soft tissue gas. Atherosclerotic vascular calcifications. No right inguinal lymphadenopathy. Small amount of free fluid is seen within the pelvis. Small bilateral hydroceles. RIGHT TIBIA-FIBULA: Bones/Joint/Cartilage No acute fracture. No dislocation. No focal erosion or site of periosteal elevation. The anteromedial aspect of the mid tibial diaphysis appears to be exposed at the site of soft tissue debridement. Postsurgical changes from right total knee arthroplasty without periprosthetic lucency or fracture. Moderate-sized complex-appearing knee joint effusion, nonspecific. Ligaments Suboptimally assessed by CT. Muscles and Tendons No acute musculotendinous abnormality by CT. No intramuscular fluid collection is seen. Soft tissues Large area of soft tissue debridement overlying the anteromedial aspect of the mid tibia with packing material evident at the debridement site. A  surgical drain is located within the soft tissues superomedial to the debridement site. Circumferential soft tissue edema with skin thickening and ill-defined fluid throughout the lower leg. No organized or rim enhancing fluid collections. No soft tissue gas. Atherosclerotic vascular calcifications. RIGHT FOOT: Bones/Joint/Cartilage No acute fracture or dislocation. No focal erosion or site of periosteal elevation.  Moderate degenerative changes of the foot, most pronounced at the first tarsometatarsal joint and within the interphalangeal joints of the toes. Multiple hammertoe deformities. No large tibiotalar or subtalar joint effusion. Ligaments Suboptimally assessed by CT. Muscles and Tendons No acute musculotendinous abnormality by CT. No appreciable tenosynovial fluid collection. Soft tissues Circumferential edema at the ankle and extending to the dorsum of the foot. No organized or rim enhancing fluid collections. Prominent edema within Kager's fat. No site of wound or ulceration is seen. No soft tissue gas. IMPRESSION: 1. Large area of soft tissue debridement overlying the anteromedial aspect of the mid right tibia with packing material evident at the debridement site. The anteromedial aspect of the mid tibial diaphysis appears to be exposed at the site of soft tissue debridement without findings to suggest osteomyelitis. 2. Circumferential soft tissue edema with skin thickening and ill-defined fluid throughout the right lower leg, ankle, and dorsal foot suggestive of cellulitis. No organized or rim enhancing fluid collections identified. No soft tissue gas. 3. Soft tissue edema is also present to a lesser degree within the right thigh. There is fluid within the superficial aspect of the medial compartment of the proximal to mid right thigh extending along the posterior aspect of the gracilis muscle approximately 15 cm in length. No rim enhancement to suggest abscess at this time. 4. Moderate-sized  complex-appearing knee joint effusion, nonspecific. Septic arthritis is not excluded. 5. No CT evidence of osteomyelitis of the femur, tibia, fibula, or foot. 6. Small amount of free fluid within the pelvis, nonspecific. Electronically Signed   By: Davina Poke D.O.   On: 12/01/2021 10:19   CT TIBIA FIBULA RIGHT W CONTRAST  Result Date: 12/01/2021 CLINICAL DATA:  Soft tissue infection suspected, thigh, xray done RLE abscess, s/p debridement, now with worsening erythema extending up medial right leg EXAM: CT OF THE LOWER RIGHT EXTREMITY WITH CONTRAST TECHNIQUE: Multidetector CT imaging of the lower right femur, tibia-fibula, and foot were performed according to the standard protocol following intravenous contrast administration. RADIATION DOSE REDUCTION: This exam was performed according to the departmental dose-optimization program which includes automated exposure control, adjustment of the mA and/or kV according to patient size and/or use of iterative reconstruction technique. CONTRAST:  141m OMNIPAQUE IOHEXOL 300 MG/ML  SOLN COMPARISON:  X-ray 11/28/2021 FINDINGS: RIGHT FEMUR: Bones/Joint/Cartilage No acute fracture. No dislocation. No erosion or periosteal elevation. Mild-moderate osteoarthritis of the right hip. No hip joint effusion. No lytic or sclerotic bone lesion. Ligaments Suboptimally assessed by CT. Muscles and Tendons No acute musculotendinous abnormality by CT. Thickened appearance of the distal quadriceps tendon, evaluation is largely obscured by streak artifact from patient's knee arthroplasty. No intramuscular fluid collections are seen. Soft tissues Subcutaneous edema throughout the right thigh. There is fluid within the superficial aspect of the medial compartment of the proximal to mid right thigh extending along the posterior aspect of the gracilis muscle approximately 15 cm in length (series 7, image 165; series 9, image 113). No well-defined rim enhancement of this fluid. Elsewhere,  no focal fluid collections within the thigh. No soft tissue gas. Atherosclerotic vascular calcifications. No right inguinal lymphadenopathy. Small amount of free fluid is seen within the pelvis. Small bilateral hydroceles. RIGHT TIBIA-FIBULA: Bones/Joint/Cartilage No acute fracture. No dislocation. No focal erosion or site of periosteal elevation. The anteromedial aspect of the mid tibial diaphysis appears to be exposed at the site of soft tissue debridement. Postsurgical changes from right total knee arthroplasty without periprosthetic lucency or fracture. Moderate-sized complex-appearing knee joint  effusion, nonspecific. Ligaments Suboptimally assessed by CT. Muscles and Tendons No acute musculotendinous abnormality by CT. No intramuscular fluid collection is seen. Soft tissues Large area of soft tissue debridement overlying the anteromedial aspect of the mid tibia with packing material evident at the debridement site. A surgical drain is located within the soft tissues superomedial to the debridement site. Circumferential soft tissue edema with skin thickening and ill-defined fluid throughout the lower leg. No organized or rim enhancing fluid collections. No soft tissue gas. Atherosclerotic vascular calcifications. RIGHT FOOT: Bones/Joint/Cartilage No acute fracture or dislocation. No focal erosion or site of periosteal elevation. Moderate degenerative changes of the foot, most pronounced at the first tarsometatarsal joint and within the interphalangeal joints of the toes. Multiple hammertoe deformities. No large tibiotalar or subtalar joint effusion. Ligaments Suboptimally assessed by CT. Muscles and Tendons No acute musculotendinous abnormality by CT. No appreciable tenosynovial fluid collection. Soft tissues Circumferential edema at the ankle and extending to the dorsum of the foot. No organized or rim enhancing fluid collections. Prominent edema within Kager's fat. No site of wound or ulceration is seen. No  soft tissue gas. IMPRESSION: 1. Large area of soft tissue debridement overlying the anteromedial aspect of the mid right tibia with packing material evident at the debridement site. The anteromedial aspect of the mid tibial diaphysis appears to be exposed at the site of soft tissue debridement without findings to suggest osteomyelitis. 2. Circumferential soft tissue edema with skin thickening and ill-defined fluid throughout the right lower leg, ankle, and dorsal foot suggestive of cellulitis. No organized or rim enhancing fluid collections identified. No soft tissue gas. 3. Soft tissue edema is also present to a lesser degree within the right thigh. There is fluid within the superficial aspect of the medial compartment of the proximal to mid right thigh extending along the posterior aspect of the gracilis muscle approximately 15 cm in length. No rim enhancement to suggest abscess at this time. 4. Moderate-sized complex-appearing knee joint effusion, nonspecific. Septic arthritis is not excluded. 5. No CT evidence of osteomyelitis of the femur, tibia, fibula, or foot. 6. Small amount of free fluid within the pelvis, nonspecific. Electronically Signed   By: Davina Poke D.O.   On: 12/01/2021 10:19   CT FOOT RIGHT W CONTRAST  Result Date: 12/01/2021 CLINICAL DATA:  Soft tissue infection suspected, thigh, xray done RLE abscess, s/p debridement, now with worsening erythema extending up medial right leg EXAM: CT OF THE LOWER RIGHT EXTREMITY WITH CONTRAST TECHNIQUE: Multidetector CT imaging of the lower right femur, tibia-fibula, and foot were performed according to the standard protocol following intravenous contrast administration. RADIATION DOSE REDUCTION: This exam was performed according to the departmental dose-optimization program which includes automated exposure control, adjustment of the mA and/or kV according to patient size and/or use of iterative reconstruction technique. CONTRAST:  157m OMNIPAQUE  IOHEXOL 300 MG/ML  SOLN COMPARISON:  X-ray 11/28/2021 FINDINGS: RIGHT FEMUR: Bones/Joint/Cartilage No acute fracture. No dislocation. No erosion or periosteal elevation. Mild-moderate osteoarthritis of the right hip. No hip joint effusion. No lytic or sclerotic bone lesion. Ligaments Suboptimally assessed by CT. Muscles and Tendons No acute musculotendinous abnormality by CT. Thickened appearance of the distal quadriceps tendon, evaluation is largely obscured by streak artifact from patient's knee arthroplasty. No intramuscular fluid collections are seen. Soft tissues Subcutaneous edema throughout the right thigh. There is fluid within the superficial aspect of the medial compartment of the proximal to mid right thigh extending along the posterior aspect of the gracilis muscle  approximately 15 cm in length (series 7, image 165; series 9, image 113). No well-defined rim enhancement of this fluid. Elsewhere, no focal fluid collections within the thigh. No soft tissue gas. Atherosclerotic vascular calcifications. No right inguinal lymphadenopathy. Small amount of free fluid is seen within the pelvis. Small bilateral hydroceles. RIGHT TIBIA-FIBULA: Bones/Joint/Cartilage No acute fracture. No dislocation. No focal erosion or site of periosteal elevation. The anteromedial aspect of the mid tibial diaphysis appears to be exposed at the site of soft tissue debridement. Postsurgical changes from right total knee arthroplasty without periprosthetic lucency or fracture. Moderate-sized complex-appearing knee joint effusion, nonspecific. Ligaments Suboptimally assessed by CT. Muscles and Tendons No acute musculotendinous abnormality by CT. No intramuscular fluid collection is seen. Soft tissues Large area of soft tissue debridement overlying the anteromedial aspect of the mid tibia with packing material evident at the debridement site. A surgical drain is located within the soft tissues superomedial to the debridement site.  Circumferential soft tissue edema with skin thickening and ill-defined fluid throughout the lower leg. No organized or rim enhancing fluid collections. No soft tissue gas. Atherosclerotic vascular calcifications. RIGHT FOOT: Bones/Joint/Cartilage No acute fracture or dislocation. No focal erosion or site of periosteal elevation. Moderate degenerative changes of the foot, most pronounced at the first tarsometatarsal joint and within the interphalangeal joints of the toes. Multiple hammertoe deformities. No large tibiotalar or subtalar joint effusion. Ligaments Suboptimally assessed by CT. Muscles and Tendons No acute musculotendinous abnormality by CT. No appreciable tenosynovial fluid collection. Soft tissues Circumferential edema at the ankle and extending to the dorsum of the foot. No organized or rim enhancing fluid collections. Prominent edema within Kager's fat. No site of wound or ulceration is seen. No soft tissue gas. IMPRESSION: 1. Large area of soft tissue debridement overlying the anteromedial aspect of the mid right tibia with packing material evident at the debridement site. The anteromedial aspect of the mid tibial diaphysis appears to be exposed at the site of soft tissue debridement without findings to suggest osteomyelitis. 2. Circumferential soft tissue edema with skin thickening and ill-defined fluid throughout the right lower leg, ankle, and dorsal foot suggestive of cellulitis. No organized or rim enhancing fluid collections identified. No soft tissue gas. 3. Soft tissue edema is also present to a lesser degree within the right thigh. There is fluid within the superficial aspect of the medial compartment of the proximal to mid right thigh extending along the posterior aspect of the gracilis muscle approximately 15 cm in length. No rim enhancement to suggest abscess at this time. 4. Moderate-sized complex-appearing knee joint effusion, nonspecific. Septic arthritis is not excluded. 5. No CT  evidence of osteomyelitis of the femur, tibia, fibula, or foot. 6. Small amount of free fluid within the pelvis, nonspecific. Electronically Signed   By: Davina Poke D.O.   On: 12/01/2021 10:19   DG Tibia/Fibula Right  Result Date: 11/28/2021 CLINICAL DATA:  Cellulitis right lower leg. Infection. Leg injury a few days ago and had stitches placed. Bandage in place at this time. EXAM: RIGHT TIBIA AND FIBULA - 2 VIEW COMPARISON:  None Available. FINDINGS: Postsurgical changes of total right knee arthroplasty. The superior aspect of the distal femoral hardware is seen on lateral view only. No perihardware lucency is seen to indicate hardware failure or loosening. There are 2 small chronic ossicles at the superior aspect of the tibial tubercle. No overlying anterior soft tissue swelling. Mild distal medial and lateral malleolar degenerative osteophytes at the ankle. No cortical erosion. No  acute fracture is seen. No dislocation. Mild vascular calcifications. IMPRESSION: 1. Status post total right knee arthroplasty. No evidence of hardware failure. 2. No cortical erosion to indicate radiographic evidence of acute osteomyelitis. Electronically Signed   By: Yvonne Kendall M.D.   On: 11/28/2021 13:41    Assessment/Plan 1. Laceration of right lower extremity, subsequent encounter Wound from 11/25/21 Patient's wound continues to improve with additional granulation tissue and shrinkage of overall wound size. Nursing has a tool to measure entire diameter 11.42 cm. Will continue daily wound changes. Daily application of 4% Hibiclens solution. Continue Wet to dry dressing changes - 4x4 gauze soaked in sterile NS wrapped in Kerlix. Wound care provided by wound care nurse and nursing staff. Will consider changing to every other day changes at time of discharge.    Family/ staff Communication: nursing  Labs/tests ordered:  none  Tomasa Rand, MD, [place] [phone]

## 2021-12-20 ENCOUNTER — Encounter: Payer: PPO | Admitting: Speech Pathology

## 2021-12-22 ENCOUNTER — Encounter: Payer: Self-pay | Admitting: Nurse Practitioner

## 2021-12-22 ENCOUNTER — Encounter: Payer: PPO | Admitting: Speech Pathology

## 2021-12-22 ENCOUNTER — Non-Acute Institutional Stay (SKILLED_NURSING_FACILITY): Payer: PPO | Admitting: Nurse Practitioner

## 2021-12-22 ENCOUNTER — Ambulatory Visit (INDEPENDENT_AMBULATORY_CARE_PROVIDER_SITE_OTHER): Payer: PPO | Admitting: Physician Assistant

## 2021-12-22 ENCOUNTER — Ambulatory Visit: Payer: PPO | Admitting: Internal Medicine

## 2021-12-22 ENCOUNTER — Encounter: Payer: Self-pay | Admitting: Physician Assistant

## 2021-12-22 ENCOUNTER — Other Ambulatory Visit: Payer: Self-pay | Admitting: Nurse Practitioner

## 2021-12-22 VITALS — BP 134/73 | HR 81 | Temp 98.0°F | Ht 74.0 in | Wt 172.0 lb

## 2021-12-22 DIAGNOSIS — A4901 Methicillin susceptible Staphylococcus aureus infection, unspecified site: Secondary | ICD-10-CM

## 2021-12-22 DIAGNOSIS — S81811D Laceration without foreign body, right lower leg, subsequent encounter: Secondary | ICD-10-CM

## 2021-12-22 DIAGNOSIS — Z09 Encounter for follow-up examination after completed treatment for conditions other than malignant neoplasm: Secondary | ICD-10-CM | POA: Diagnosis not present

## 2021-12-22 DIAGNOSIS — M5442 Lumbago with sciatica, left side: Secondary | ICD-10-CM | POA: Diagnosis not present

## 2021-12-22 DIAGNOSIS — T8149XD Infection following a procedure, other surgical site, subsequent encounter: Secondary | ICD-10-CM

## 2021-12-22 DIAGNOSIS — G8929 Other chronic pain: Secondary | ICD-10-CM

## 2021-12-22 DIAGNOSIS — I10 Essential (primary) hypertension: Secondary | ICD-10-CM | POA: Diagnosis not present

## 2021-12-22 DIAGNOSIS — M542 Cervicalgia: Secondary | ICD-10-CM | POA: Diagnosis not present

## 2021-12-22 MED ORDER — MELOXICAM 7.5 MG PO TABS: 7.5000 mg | ORAL_TABLET | Freq: Every day | ORAL | 0 refills | Status: DC

## 2021-12-22 MED ORDER — TESTOSTERONE CYPIONATE 200 MG/ML IM SOLN: 200.0000 mg | INTRAMUSCULAR | 2 refills | Status: DC

## 2021-12-22 NOTE — Progress Notes (Signed)
[facility] POST-OP OFFICE VISIT  12/22/2021  HPI: Theodore [name] is a 88 y.o. male 23 days s/p Incision and Drainage of right lower extremity wound, debridement of skin and subcutaneous tissue of right lower extremity with Dr Kirke Corin.   Discharged to SNF on 096/27 Continues local wound care with wet to dry dressing changes daily No fever, chills Pain is improved No longer with erythema or swelling to the extremity He is able to weight bear and ambulates with walker No new complaints  Vital signs: BP 134/73   Pulse 81   Temp 98 F (36.7 C)   Ht '6\' 2"'$  (1.88 m)   Wt 172 lb (78 kg)   SpO2 97%   BMI 22.08 kg/m    Physical Exam: Constitutional: Well appearing male, NAD Skin: He has an approximately 6 x 2 x 0.5 cm wound to the anterior right shin, to the more lateral aspect there remains bone exposed with fibrinous tissue overlay a small percentage of this, the medial portion has healthy granulation tissue. Previous seen surrounding edema and erythema is now resolved. This is not overtly tender.    Assessment/Plan: This is a 88 y.o. male 23 days s/p Incision and Drainage of right lower extremity wound, debridement of skin and subcutaneous tissue of right lower extremity with Dr Kirke Corin.    - Pain control prn  - Reviewed wound care recommendation; continue wet-to-dry dressings daily and as needed, cover and secure. Okay to continue to [facility]  - I will see him again in ~1 month for wound check; He, and his family, understands to call with questions/concerns in the interim  -- Edison Simon, PA-C [facility] 12/22/2021, 2:40 PM M-F: 7am - 4pm

## 2021-12-22 NOTE — Patient Instructions (Signed)
Change wet to dry dressing daily. Ok if it gets changed every other day.  No need to use the Hibiclens solution on the wound.  Try to increase your Protein intake to aid in healing. Also Zinc can help with wound healing.   Follow up here in one month.

## 2021-12-22 NOTE — Progress Notes (Signed)
Location:   [facility] Room Number: 102 Place of Service:  SNF ([phone]) Provider:  Sherrie Mustache, NP  [name], MD  Patient Care Team: [name], MD as PCP - General (Internal Medicine) Minna Merritts, MD as PCP - Cardiology (Cardiology)  Extended Emergency Contact Information Primary Emergency Contact: Gallina,[name] Address: [address]          [place], [place] [address] Johnnette Litter of [place] Phone: [phone] Mobile Phone: [phone] Relation: Spouse  Code Status:  FULL CODE Goals of care: Advanced Directive information    12/22/2021   12:10 PM  Advanced Directives  Does Patient Have a Medical Advance Directive? Yes  Type of Advance Directive [place]  Does patient want to make changes to medical advance directive? No - Patient declined  Copy of [place] in Chart? Yes - validated most recent copy scanned in chart (See row information)     Chief Complaint  Patient presents with   Acute Visit    Neck and back pain    HPI:  Pt is a 88 y.o. male seen today for an acute visit for severe neck and back pain.  Pt reports he has a hx of low back pain with sciatica and neck pain and has been seeing a chiropractor prior to coming into the [facility] facility that had really helped with pain and mobility.  Reports last night his neck was tensing up and causing him excruciating pain. Reports he could not get comfortable. Tylenol was given and did not help.  He did use a muscle rub which helped.  He has decreased ROM in neck    Past Medical History:  Diagnosis Date   Actinic keratosis    Arthritis    knees   Asymptomatic Sinus bradycardia    a. 05/2016 Zio: Avg HR 61 (41-167).   Benign essential tremor    Cancer ([place]) 2002   melanoma right ear   Colon polyp    COPD (chronic obstructive pulmonary disease) ([place])    pt said he believes it is a misdiagnosis    Coronary artery  calcification seen on CT scan    a. 06/2016 CTA Chest: cor Ca2+; b. 05/2017 MV: EF >65%. No ischemia/infarct.   Depression    History of echocardiogram    a. 04/2016 Echo: EF 60-65%, mild conc LVH. Nl PASP.   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 05/2016 Zio: Avg HR 61 (41-167). 9 SVT runs (fastest 167 - 5 beats; longest 8 beats - 101 bpm). Rare PACs/PVCs.   PVC's (premature ventricular contractions)    Rhinitis    Wears dentures    partial upper and lower   Wears hearing aid in both ears    Past Surgical History:  Procedure Laterality Date   ADENOIDECTOMY     CATARACT EXTRACTION W/PHACO Left 10/22/2017   Procedure: CATARACT EXTRACTION PHACO AND INTRAOCULAR LENS PLACEMENT (IOC)  LEFT;  Surgeon: Eulogio Bear, MD;  Location: [facility];  Service: Ophthalmology;  Laterality: Left;   CATARACT EXTRACTION W/PHACO Right 11/20/2017   Procedure: CATARACT  EXTRACTION PHACO AND INTRAOCULAR LENS PLACEMENT (IOC) RIGHT;  Surgeon: Eulogio Bear, MD;  Location: [facility];  Service: Ophthalmology;  Laterality: Right;   ESOPHAGOGASTRODUODENOSCOPY (EGD) WITH PROPOFOL N/A 10/31/2016   Procedure: ESOPHAGOGASTRODUODENOSCOPY (EGD) WITH PROPOFOL;  Surgeon: Lollie Sails, MD;  Location: [facility];  Service: Endoscopy;  Laterality: N/A;   I & D EXTREMITY Right 11/29/2021   Procedure: IRRIGATION AND DEBRIDEMENT EXTREMITY;  Surgeon: Olean Ree, MD;  Location: [facility] ORS;  Service: General;  Laterality: Right;   JOINT REPLACEMENT Right 2003   knee   knee meniscus repair Right    MELANOMA EXCISION Right    ear. Followed by Dr. Nehemiah Massed   PATELLECTOMY Bilateral    TONSILLECTOMY      Allergies  Allergen  Reactions   Penicillins Rash and Other (See Comments)    Other reaction(s): Other (see comments) Other reaction(s): UNKNOWN   Lexapro [Escitalopram] Other (See Comments)    Increased hand tremor    Allergies as of 12/22/2021       Reactions   Penicillins Rash, Other (See Comments)   Other reaction(s): Other (see comments) Other reaction(s): UNKNOWN   Lexapro [escitalopram] Other (See Comments)   Increased hand tremor        Medication List        Accurate as of December 22, 2021 12:11 PM. If you have any questions, ask your nurse or doctor.          acetaminophen 500 MG tablet Commonly known as: TYLENOL Take 1,000 mg by mouth daily. As needed for pain r/t wound care give 30 min prior to wound change   ascorbic acid 500 MG tablet Commonly known as: VITAMIN C Take 500 mg by mouth daily.   buPROPion 150 MG 12 hr tablet Commonly known as: WELLBUTRIN SR Take 150 mg by mouth daily.   cephALEXin 500 MG capsule Commonly known as: KEFLEX Take 500 mg by mouth 4 (four) times daily. For 2 weeks for wound healing   cetaphil lotion Apply 1 Application topically every 8 (eight) hours as needed for dry skin (For itchiness).   chlorhexidine 4 % external liquid Commonly known as: HIBICLENS Apply 1 Application topically as directed. Apply to Right lower leg topically every day shift for use to clean and flush wound for 7 Days   cyanocobalamin 500 MCG tablet Commonly known as: VITAMIN B12 Take 1 tablet by mouth daily.   cycloSPORINE 0.05 % ophthalmic emulsion Commonly known as: RESTASIS Place 1 drop into both eyes 2 (two) times daily.   ezetimibe 10 MG tablet Commonly known as: ZETIA TAKE 1 TABLET BY MOUTH EVERY DAY   levothyroxine 100 MCG tablet Commonly known as: SYNTHROID TAKE 1 TABLET BY MOUTH EVERY DAY BEFORE BREAKFAST   losartan 25 MG tablet Commonly known as: COZAAR Take 1 tablet (25 mg total) by mouth daily.   magnesium oxide 400 MG tablet Commonly known  as: MAG-OX Take 400 mg by mouth daily.   mirtazapine 15 MG tablet Commonly known as: Remeron Take 1 tablet (15 mg total) by mouth at bedtime.   PREPARATION H SOOTHING RELIEF EX Apply topically. Apply to Rectal area topically every 24 hours as needed for discomfort   Probiotic 250 MG Caps Take 1 capsule by mouth daily at 12 noon.   rosuvastatin 10 MG tablet Commonly known as: CRESTOR Take 1 tablet (10 mg total) by mouth daily.   senna 8.6 MG Tabs tablet Commonly known as: SENOKOT Take 2 tablets by mouth at bedtime.  For bowel regimen   testosterone cypionate 200 MG/ML injection Commonly known as: DEPOTESTOSTERONE CYPIONATE Inject 1 mL (200 mg total) into the muscle every 14 (fourteen) days. What changed: See the new instructions. Changed by: Lauree Chandler, NP   Vitamin D3 50 MCG (2000 UT) Tabs Take 1 tablet by mouth daily.        Review of Systems  Constitutional:  Positive for activity change.  Musculoskeletal:  Positive for arthralgias, back pain, gait problem, myalgias and neck pain.  Neurological:  Positive for weakness.  Psychiatric/Behavioral:  Positive for agitation and sleep disturbance. The patient is nervous/anxious.     Immunization History  Administered Date(s) Administered   Fluad Quad(high Dose 65+) 12/13/2018, 12/21/2020   Influenza,inj,Quad PF,6+ Mos 01/14/2014, 01/25/2018   Influenza-Unspecified 12/11/2012, 12/26/2019   Moderna Sars-Covid-2 Vaccination 04/25/2019, 05/16/2019, 06/26/2019, 01/17/2020   Pneumococcal Conjugate-13 01/14/2014, 10/04/2018, 12/13/2018   Pneumococcal Polysaccharide-23 04/28/2012   Td 12/02/2021   Tdap 08/19/2014   Zoster Recombinat (Shingrix) 03/12/2017, 09/19/2021   Zoster, Live 04/28/2010   Pertinent  Health Maintenance Due  Topic Date Due   INFLUENZA VACCINE  10/11/2021      12/05/2021    9:36 AM 12/05/2021   10:30 PM 12/06/2021    9:15 AM 12/06/2021   10:20 PM 12/07/2021   10:00 AM  Fall Risk  Patient Fall  Risk Level High fall risk High fall risk High fall risk High fall risk High fall risk   Functional Status Survey:    Vitals:   12/22/21 1159  BP: (!) 161/81  Pulse: 83  Resp: 18  Temp: 97.8 F (36.6 C)  SpO2: 96%  Weight: 168 lb 6.4 oz (76.4 kg)  Height: '6\' 2"'$  (1.88 m)   Body mass index is 21.62 kg/m. Physical Exam  Labs reviewed: Recent Labs    11/30/21 0503 12/01/21 0528 12/02/21 0433 12/03/21 0734 12/05/21 0726  NA 137 137 139  --  138  K 3.7 3.6 3.8  --  3.9  CL 109 107 109  --  104  CO2 '23 24 25  '$ --  27  GLUCOSE 127* 111* 125*  --  97  BUN 28* 26* 28*  --  22  CREATININE 1.12 1.26* 1.13 1.14 1.14  CALCIUM 7.9* 8.2* 8.4*  --  8.4*  MG 1.9  --   --   --  2.1   Recent Labs    08/26/21 1057 12/02/21 0433 12/05/21 0726  AST 18 20 57*  ALT 17 15 43  ALKPHOS 44 71 67  BILITOT 1.4* 0.5 0.7  PROT 6.2 5.5* 5.9*  ALBUMIN 4.1 2.4* 2.6*   Recent Labs    01/03/21 1327 01/17/21 1457 08/26/21 1057 11/28/21 1204 12/01/21 1020 12/02/21 0433 12/05/21 0726  WBC 5.0 5.0 6.1   < > 8.1 7.4 6.2  NEUTROABS 3.5 3.3 4.0  --   --   --   --   HGB 15.2 14.9 15.1   < > 11.9* 11.5* 11.8*  HCT 43.7 43.8 44.7   < > 37.0* 35.4* 35.6*  MCV 96.7 94.3 100.9*   < > 96.6 94.4 93.2  PLT 206 243.0 217.0   < > 207 245 349   < > = values in this interval not displayed.   Lab Results  Component Value Date   TSH 0.97 08/26/2021   Lab Results  Component Value Date   HGBA1C 4.8 01/17/2021   Lab Results  Component Value Date   CHOL 197 09/21/2020   HDL  62.00 09/21/2020   LDLCALC 121 (H) 09/21/2020   TRIG 70.0 09/21/2020   CHOLHDL 3 09/21/2020    Significant Diagnostic Results in last 30 days:  CT FEMUR RIGHT W CONTRAST  Result Date: 12/03/2021 CLINICAL DATA:  Soft tissue infection.  Follow-up fluid collections. EXAM: CT OF THE LOWER RIGHT EXTREMITY WITH CONTRAST TECHNIQUE: Multidetector CT imaging of the lower right extremity was performed according to the standard protocol  following intravenous contrast administration. RADIATION DOSE REDUCTION: This exam was performed according to the departmental dose-optimization program which includes automated exposure control, adjustment of the mA and/or kV according to patient size and/or use of iterative reconstruction technique. CONTRAST:  169m OMNIPAQUE IOHEXOL 300 MG/ML  SOLN COMPARISON:  CT 12/01/2021 FINDINGS: RIGHT FEMUR: Bones/Joint/Cartilage No acute fracture or dislocation. No erosion or periosteal elevation. Similar degenerative changes of the right hip. No appreciable right hip joint effusion. No lytic or sclerotic bone lesion. Ligaments Suboptimally assessed by CT. Muscles and Tendons Similar degree of thickening in the region of the distal quadriceps tendon. No intramuscular fluid collections. Soft tissues Complete interval resolution of the previously described fluid within the medial compartment of the thigh extending along the course of the right gracilis muscle. Subcutaneous edema and ill-defined fluid along the medial and lateral aspects of the right thigh is similar to the previous study. No organized or rim enhancing fluid collection. No soft tissue gas. No right inguinal lymphadenopathy. Small bilateral hydroceles. Urinary bladder appears circumferentially thickened. RIGHT TIBIA-FIBULA: Bones/Joint/Cartilage No acute fracture. No dislocation. No site of erosion or periosteal elevation. The anteromedial aspect of the mid tibial diaphysis is again seen exposed at site of soft tissue debridement within the lower leg. Grossly intact right total knee arthroplasty hardware. Ligaments Suboptimally assessed by CT. Muscles and Tendons No acute musculotendinous abnormality by CT. No intramuscular fluid collection is seen. Soft tissues Wide soft tissue debridement site along the anteromedial aspect of the mid tibia with packing material. The degree of soft tissue edema of the lower leg has increased from prior. Surgical Penrose drain  is again seen within the soft tissues medially. No soft tissue gas. No organized or rim enhancing fluid collections of the lower leg. RIGHT FOOT: Bones/Joint/Cartilage No acute fracture or dislocation. No site of erosion or periosteal elevation. Similar degree of degenerative changes of the foot. No significant joint effusion is seen. Ligaments Suboptimally assessed by CT. Muscles and Tendons No acute musculotendinous abnormality by CT. No large tenosynovial fluid collection. Soft tissues Circumferential edema of the ankle and dorsal foot. No organized or rim enhancing fluid collection. No soft tissue gas. IMPRESSION: 1. Complete interval resolution of previously described fluid within the medial compartment of the right thigh seen along the course of the right gracilis muscle. 2. Increased soft tissue edema of the right lower leg, nonspecific but could reflect worsening cellulitis. No evidence of abscess or soft tissue gas. 3. No acute osseous abnormality of the right lower extremity. 4. Circumferentially thickened appearance of the urinary bladder. Correlate with urinalysis. Electronically Signed   By: NDavina PokeD.O.   On: 12/03/2021 17:22   CT TIBIA FIBULA RIGHT W CONTRAST  Result Date: 12/03/2021 CLINICAL DATA:  Soft tissue infection.  Follow-up fluid collections. EXAM: CT OF THE LOWER RIGHT EXTREMITY WITH CONTRAST TECHNIQUE: Multidetector CT imaging of the lower right extremity was performed according to the standard protocol following intravenous contrast administration. RADIATION DOSE REDUCTION: This exam was performed according to the departmental dose-optimization program which includes automated exposure control, adjustment of  the mA and/or kV according to patient size and/or use of iterative reconstruction technique. CONTRAST:  134m OMNIPAQUE IOHEXOL 300 MG/ML  SOLN COMPARISON:  CT 12/01/2021 FINDINGS: RIGHT FEMUR: Bones/Joint/Cartilage No acute fracture or dislocation. No erosion or  periosteal elevation. Similar degenerative changes of the right hip. No appreciable right hip joint effusion. No lytic or sclerotic bone lesion. Ligaments Suboptimally assessed by CT. Muscles and Tendons Similar degree of thickening in the region of the distal quadriceps tendon. No intramuscular fluid collections. Soft tissues Complete interval resolution of the previously described fluid within the medial compartment of the thigh extending along the course of the right gracilis muscle. Subcutaneous edema and ill-defined fluid along the medial and lateral aspects of the right thigh is similar to the previous study. No organized or rim enhancing fluid collection. No soft tissue gas. No right inguinal lymphadenopathy. Small bilateral hydroceles. Urinary bladder appears circumferentially thickened. RIGHT TIBIA-FIBULA: Bones/Joint/Cartilage No acute fracture. No dislocation. No site of erosion or periosteal elevation. The anteromedial aspect of the mid tibial diaphysis is again seen exposed at site of soft tissue debridement within the lower leg. Grossly intact right total knee arthroplasty hardware. Ligaments Suboptimally assessed by CT. Muscles and Tendons No acute musculotendinous abnormality by CT. No intramuscular fluid collection is seen. Soft tissues Wide soft tissue debridement site along the anteromedial aspect of the mid tibia with packing material. The degree of soft tissue edema of the lower leg has increased from prior. Surgical Penrose drain is again seen within the soft tissues medially. No soft tissue gas. No organized or rim enhancing fluid collections of the lower leg. RIGHT FOOT: Bones/Joint/Cartilage No acute fracture or dislocation. No site of erosion or periosteal elevation. Similar degree of degenerative changes of the foot. No significant joint effusion is seen. Ligaments Suboptimally assessed by CT. Muscles and Tendons No acute musculotendinous abnormality by CT. No large tenosynovial fluid  collection. Soft tissues Circumferential edema of the ankle and dorsal foot. No organized or rim enhancing fluid collection. No soft tissue gas. IMPRESSION: 1. Complete interval resolution of previously described fluid within the medial compartment of the right thigh seen along the course of the right gracilis muscle. 2. Increased soft tissue edema of the right lower leg, nonspecific but could reflect worsening cellulitis. No evidence of abscess or soft tissue gas. 3. No acute osseous abnormality of the right lower extremity. 4. Circumferentially thickened appearance of the urinary bladder. Correlate with urinalysis. Electronically Signed   By: NDavina PokeD.O.   On: 12/03/2021 17:22   CT FOOT RIGHT W CONTRAST  Result Date: 12/03/2021 CLINICAL DATA:  Soft tissue infection.  Follow-up fluid collections. EXAM: CT OF THE LOWER RIGHT EXTREMITY WITH CONTRAST TECHNIQUE: Multidetector CT imaging of the lower right extremity was performed according to the standard protocol following intravenous contrast administration. RADIATION DOSE REDUCTION: This exam was performed according to the departmental dose-optimization program which includes automated exposure control, adjustment of the mA and/or kV according to patient size and/or use of iterative reconstruction technique. CONTRAST:  1032mOMNIPAQUE IOHEXOL 300 MG/ML  SOLN COMPARISON:  CT 12/01/2021 FINDINGS: RIGHT FEMUR: Bones/Joint/Cartilage No acute fracture or dislocation. No erosion or periosteal elevation. Similar degenerative changes of the right hip. No appreciable right hip joint effusion. No lytic or sclerotic bone lesion. Ligaments Suboptimally assessed by CT. Muscles and Tendons Similar degree of thickening in the region of the distal quadriceps tendon. No intramuscular fluid collections. Soft tissues Complete interval resolution of the previously described fluid within the medial compartment  of the thigh extending along the course of the right gracilis  muscle. Subcutaneous edema and ill-defined fluid along the medial and lateral aspects of the right thigh is similar to the previous study. No organized or rim enhancing fluid collection. No soft tissue gas. No right inguinal lymphadenopathy. Small bilateral hydroceles. Urinary bladder appears circumferentially thickened. RIGHT TIBIA-FIBULA: Bones/Joint/Cartilage No acute fracture. No dislocation. No site of erosion or periosteal elevation. The anteromedial aspect of the mid tibial diaphysis is again seen exposed at site of soft tissue debridement within the lower leg. Grossly intact right total knee arthroplasty hardware. Ligaments Suboptimally assessed by CT. Muscles and Tendons No acute musculotendinous abnormality by CT. No intramuscular fluid collection is seen. Soft tissues Wide soft tissue debridement site along the anteromedial aspect of the mid tibia with packing material. The degree of soft tissue edema of the lower leg has increased from prior. Surgical Penrose drain is again seen within the soft tissues medially. No soft tissue gas. No organized or rim enhancing fluid collections of the lower leg. RIGHT FOOT: Bones/Joint/Cartilage No acute fracture or dislocation. No site of erosion or periosteal elevation. Similar degree of degenerative changes of the foot. No significant joint effusion is seen. Ligaments Suboptimally assessed by CT. Muscles and Tendons No acute musculotendinous abnormality by CT. No large tenosynovial fluid collection. Soft tissues Circumferential edema of the ankle and dorsal foot. No organized or rim enhancing fluid collection. No soft tissue gas. IMPRESSION: 1. Complete interval resolution of previously described fluid within the medial compartment of the right thigh seen along the course of the right gracilis muscle. 2. Increased soft tissue edema of the right lower leg, nonspecific but could reflect worsening cellulitis. No evidence of abscess or soft tissue gas. 3. No acute  osseous abnormality of the right lower extremity. 4. Circumferentially thickened appearance of the urinary bladder. Correlate with urinalysis. Electronically Signed   By: Davina Poke D.O.   On: 12/03/2021 17:22   CT FEMUR RIGHT W CONTRAST  Result Date: 12/01/2021 CLINICAL DATA:  Soft tissue infection suspected, thigh, xray done RLE abscess, s/p debridement, now with worsening erythema extending up medial right leg EXAM: CT OF THE LOWER RIGHT EXTREMITY WITH CONTRAST TECHNIQUE: Multidetector CT imaging of the lower right femur, tibia-fibula, and foot were performed according to the standard protocol following intravenous contrast administration. RADIATION DOSE REDUCTION: This exam was performed according to the departmental dose-optimization program which includes automated exposure control, adjustment of the mA and/or kV according to patient size and/or use of iterative reconstruction technique. CONTRAST:  137m OMNIPAQUE IOHEXOL 300 MG/ML  SOLN COMPARISON:  X-ray 11/28/2021 FINDINGS: RIGHT FEMUR: Bones/Joint/Cartilage No acute fracture. No dislocation. No erosion or periosteal elevation. Mild-moderate osteoarthritis of the right hip. No hip joint effusion. No lytic or sclerotic bone lesion. Ligaments Suboptimally assessed by CT. Muscles and Tendons No acute musculotendinous abnormality by CT. Thickened appearance of the distal quadriceps tendon, evaluation is largely obscured by streak artifact from patient's knee arthroplasty. No intramuscular fluid collections are seen. Soft tissues Subcutaneous edema throughout the right thigh. There is fluid within the superficial aspect of the medial compartment of the proximal to mid right thigh extending along the posterior aspect of the gracilis muscle approximately 15 cm in length (series 7, image 165; series 9, image 113). No well-defined rim enhancement of this fluid. Elsewhere, no focal fluid collections within the thigh. No soft tissue gas. Atherosclerotic  vascular calcifications. No right inguinal lymphadenopathy. Small amount of free fluid is seen within the pelvis.  Small bilateral hydroceles. RIGHT TIBIA-FIBULA: Bones/Joint/Cartilage No acute fracture. No dislocation. No focal erosion or site of periosteal elevation. The anteromedial aspect of the mid tibial diaphysis appears to be exposed at the site of soft tissue debridement. Postsurgical changes from right total knee arthroplasty without periprosthetic lucency or fracture. Moderate-sized complex-appearing knee joint effusion, nonspecific. Ligaments Suboptimally assessed by CT. Muscles and Tendons No acute musculotendinous abnormality by CT. No intramuscular fluid collection is seen. Soft tissues Large area of soft tissue debridement overlying the anteromedial aspect of the mid tibia with packing material evident at the debridement site. A surgical drain is located within the soft tissues superomedial to the debridement site. Circumferential soft tissue edema with skin thickening and ill-defined fluid throughout the lower leg. No organized or rim enhancing fluid collections. No soft tissue gas. Atherosclerotic vascular calcifications. RIGHT FOOT: Bones/Joint/Cartilage No acute fracture or dislocation. No focal erosion or site of periosteal elevation. Moderate degenerative changes of the foot, most pronounced at the first tarsometatarsal joint and within the interphalangeal joints of the toes. Multiple hammertoe deformities. No large tibiotalar or subtalar joint effusion. Ligaments Suboptimally assessed by CT. Muscles and Tendons No acute musculotendinous abnormality by CT. No appreciable tenosynovial fluid collection. Soft tissues Circumferential edema at the ankle and extending to the dorsum of the foot. No organized or rim enhancing fluid collections. Prominent edema within K[place]'s fat. No site of wound or ulceration is seen. No soft tissue gas. IMPRESSION: 1. Large area of soft tissue debridement overlying  the anteromedial aspect of the mid right tibia with packing material evident at the debridement site. The anteromedial aspect of the mid tibial diaphysis appears to be exposed at the site of soft tissue debridement without findings to suggest osteomyelitis. 2. Circumferential soft tissue edema with skin thickening and ill-defined fluid throughout the right lower leg, ankle, and dorsal foot suggestive of cellulitis. No organized or rim enhancing fluid collections identified. No soft tissue gas. 3. Soft tissue edema is also present to a lesser degree within the right thigh. There is fluid within the superficial aspect of the medial compartment of the proximal to mid right thigh extending along the posterior aspect of the gracilis muscle approximately 15 cm in length. No rim enhancement to suggest abscess at this time. 4. Moderate-sized complex-appearing knee joint effusion, nonspecific. Septic arthritis is not excluded. 5. No CT evidence of osteomyelitis of the femur, tibia, fibula, or foot. 6. Small amount of free fluid within the pelvis, nonspecific. Electronically Signed   By: Davina Poke D.O.   On: 12/01/2021 10:19   CT TIBIA FIBULA RIGHT W CONTRAST  Result Date: 12/01/2021 CLINICAL DATA:  Soft tissue infection suspected, thigh, xray done RLE abscess, s/p debridement, now with worsening erythema extending up medial right leg EXAM: CT OF THE LOWER RIGHT EXTREMITY WITH CONTRAST TECHNIQUE: Multidetector CT imaging of the lower right femur, tibia-fibula, and foot were performed according to the standard protocol following intravenous contrast administration. RADIATION DOSE REDUCTION: This exam was performed according to the departmental dose-optimization program which includes automated exposure control, adjustment of the mA and/or kV according to patient size and/or use of iterative reconstruction technique. CONTRAST:  130m OMNIPAQUE IOHEXOL 300 MG/ML  SOLN COMPARISON:  X-ray 11/28/2021 FINDINGS: RIGHT  FEMUR: Bones/Joint/Cartilage No acute fracture. No dislocation. No erosion or periosteal elevation. Mild-moderate osteoarthritis of the right hip. No hip joint effusion. No lytic or sclerotic bone lesion. Ligaments Suboptimally assessed by CT. Muscles and Tendons No acute musculotendinous abnormality by CT. Thickened appearance of the  distal quadriceps tendon, evaluation is largely obscured by streak artifact from patient's knee arthroplasty. No intramuscular fluid collections are seen. Soft tissues Subcutaneous edema throughout the right thigh. There is fluid within the superficial aspect of the medial compartment of the proximal to mid right thigh extending along the posterior aspect of the gracilis muscle approximately 15 cm in length (series 7, image 165; series 9, image 113). No well-defined rim enhancement of this fluid. Elsewhere, no focal fluid collections within the thigh. No soft tissue gas. Atherosclerotic vascular calcifications. No right inguinal lymphadenopathy. Small amount of free fluid is seen within the pelvis. Small bilateral hydroceles. RIGHT TIBIA-FIBULA: Bones/Joint/Cartilage No acute fracture. No dislocation. No focal erosion or site of periosteal elevation. The anteromedial aspect of the mid tibial diaphysis appears to be exposed at the site of soft tissue debridement. Postsurgical changes from right total knee arthroplasty without periprosthetic lucency or fracture. Moderate-sized complex-appearing knee joint effusion, nonspecific. Ligaments Suboptimally assessed by CT. Muscles and Tendons No acute musculotendinous abnormality by CT. No intramuscular fluid collection is seen. Soft tissues Large area of soft tissue debridement overlying the anteromedial aspect of the mid tibia with packing material evident at the debridement site. A surgical drain is located within the soft tissues superomedial to the debridement site. Circumferential soft tissue edema with skin thickening and ill-defined  fluid throughout the lower leg. No organized or rim enhancing fluid collections. No soft tissue gas. Atherosclerotic vascular calcifications. RIGHT FOOT: Bones/Joint/Cartilage No acute fracture or dislocation. No focal erosion or site of periosteal elevation. Moderate degenerative changes of the foot, most pronounced at the first tarsometatarsal joint and within the interphalangeal joints of the toes. Multiple hammertoe deformities. No large tibiotalar or subtalar joint effusion. Ligaments Suboptimally assessed by CT. Muscles and Tendons No acute musculotendinous abnormality by CT. No appreciable tenosynovial fluid collection. Soft tissues Circumferential edema at the ankle and extending to the dorsum of the foot. No organized or rim enhancing fluid collections. Prominent edema within Kager's fat. No site of wound or ulceration is seen. No soft tissue gas. IMPRESSION: 1. Large area of soft tissue debridement overlying the anteromedial aspect of the mid right tibia with packing material evident at the debridement site. The anteromedial aspect of the mid tibial diaphysis appears to be exposed at the site of soft tissue debridement without findings to suggest osteomyelitis. 2. Circumferential soft tissue edema with skin thickening and ill-defined fluid throughout the right lower leg, ankle, and dorsal foot suggestive of cellulitis. No organized or rim enhancing fluid collections identified. No soft tissue gas. 3. Soft tissue edema is also present to a lesser degree within the right thigh. There is fluid within the superficial aspect of the medial compartment of the proximal to mid right thigh extending along the posterior aspect of the gracilis muscle approximately 15 cm in length. No rim enhancement to suggest abscess at this time. 4. Moderate-sized complex-appearing knee joint effusion, nonspecific. Septic arthritis is not excluded. 5. No CT evidence of osteomyelitis of the femur, tibia, fibula, or foot. 6. Small  amount of free fluid within the pelvis, nonspecific. Electronically Signed   By: Davina Poke D.O.   On: 12/01/2021 10:19   CT FOOT RIGHT W CONTRAST  Result Date: 12/01/2021 CLINICAL DATA:  Soft tissue infection suspected, thigh, xray done RLE abscess, s/p debridement, now with worsening erythema extending up medial right leg EXAM: CT OF THE LOWER RIGHT EXTREMITY WITH CONTRAST TECHNIQUE: Multidetector CT imaging of the lower right femur, tibia-fibula, and foot were performed according  to the standard protocol following intravenous contrast administration. RADIATION DOSE REDUCTION: This exam was performed according to the departmental dose-optimization program which includes automated exposure control, adjustment of the mA and/or kV according to patient size and/or use of iterative reconstruction technique. CONTRAST:  130m OMNIPAQUE IOHEXOL 300 MG/ML  SOLN COMPARISON:  X-ray 11/28/2021 FINDINGS: RIGHT FEMUR: Bones/Joint/Cartilage No acute fracture. No dislocation. No erosion or periosteal elevation. Mild-moderate osteoarthritis of the right hip. No hip joint effusion. No lytic or sclerotic bone lesion. Ligaments Suboptimally assessed by CT. Muscles and Tendons No acute musculotendinous abnormality by CT. Thickened appearance of the distal quadriceps tendon, evaluation is largely obscured by streak artifact from patient's knee arthroplasty. No intramuscular fluid collections are seen. Soft tissues Subcutaneous edema throughout the right thigh. There is fluid within the superficial aspect of the medial compartment of the proximal to mid right thigh extending along the posterior aspect of the gracilis muscle approximately 15 cm in length (series 7, image 165; series 9, image 113). No well-defined rim enhancement of this fluid. Elsewhere, no focal fluid collections within the thigh. No soft tissue gas. Atherosclerotic vascular calcifications. No right inguinal lymphadenopathy. Small amount of free fluid is seen  within the pelvis. Small bilateral hydroceles. RIGHT TIBIA-FIBULA: Bones/Joint/Cartilage No acute fracture. No dislocation. No focal erosion or site of periosteal elevation. The anteromedial aspect of the mid tibial diaphysis appears to be exposed at the site of soft tissue debridement. Postsurgical changes from right total knee arthroplasty without periprosthetic lucency or fracture. Moderate-sized complex-appearing knee joint effusion, nonspecific. Ligaments Suboptimally assessed by CT. Muscles and Tendons No acute musculotendinous abnormality by CT. No intramuscular fluid collection is seen. Soft tissues Large area of soft tissue debridement overlying the anteromedial aspect of the mid tibia with packing material evident at the debridement site. A surgical drain is located within the soft tissues superomedial to the debridement site. Circumferential soft tissue edema with skin thickening and ill-defined fluid throughout the lower leg. No organized or rim enhancing fluid collections. No soft tissue gas. Atherosclerotic vascular calcifications. RIGHT FOOT: Bones/Joint/Cartilage No acute fracture or dislocation. No focal erosion or site of periosteal elevation. Moderate degenerative changes of the foot, most pronounced at the first tarsometatarsal joint and within the interphalangeal joints of the toes. Multiple hammertoe deformities. No large tibiotalar or subtalar joint effusion. Ligaments Suboptimally assessed by CT. Muscles and Tendons No acute musculotendinous abnormality by CT. No appreciable tenosynovial fluid collection. Soft tissues Circumferential edema at the ankle and extending to the dorsum of the foot. No organized or rim enhancing fluid collections. Prominent edema within Kager's fat. No site of wound or ulceration is seen. No soft tissue gas. IMPRESSION: 1. Large area of soft tissue debridement overlying the anteromedial aspect of the mid right tibia with packing material evident at the debridement  site. The anteromedial aspect of the mid tibial diaphysis appears to be exposed at the site of soft tissue debridement without findings to suggest osteomyelitis. 2. Circumferential soft tissue edema with skin thickening and ill-defined fluid throughout the right lower leg, ankle, and dorsal foot suggestive of cellulitis. No organized or rim enhancing fluid collections identified. No soft tissue gas. 3. Soft tissue edema is also present to a lesser degree within the right thigh. There is fluid within the superficial aspect of the medial compartment of the proximal to mid right thigh extending along the posterior aspect of the gracilis muscle approximately 15 cm in length. No rim enhancement to suggest abscess at this time. 4. Moderate-sized complex-appearing knee  joint effusion, nonspecific. Septic arthritis is not excluded. 5. No CT evidence of osteomyelitis of the femur, tibia, fibula, or foot. 6. Small amount of free fluid within the pelvis, nonspecific. Electronically Signed   By: Davina Poke D.O.   On: 12/01/2021 10:19   DG Tibia/Fibula Right  Result Date: 11/28/2021 CLINICAL DATA:  Cellulitis right lower leg. Infection. Leg injury a few days ago and had stitches placed. Bandage in place at this time. EXAM: RIGHT TIBIA AND FIBULA - 2 VIEW COMPARISON:  None Available. FINDINGS: Postsurgical changes of total right knee arthroplasty. The superior aspect of the distal femoral hardware is seen on lateral view only. No perihardware lucency is seen to indicate hardware failure or loosening. There are 2 small chronic ossicles at the superior aspect of the tibial tubercle. No overlying anterior soft tissue swelling. Mild distal medial and lateral malleolar degenerative osteophytes at the ankle. No cortical erosion. No acute fracture is seen. No dislocation. Mild vascular calcifications. IMPRESSION: 1. Status post total right knee arthroplasty. No evidence of hardware failure. 2. No cortical erosion to indicate  radiographic evidence of acute osteomyelitis. Electronically Signed   By: Yvonne Kendall M.D.   On: 11/28/2021 13:41    Assessment/Plan 1. Neck pain, bilateral -with decrease ROM and pain on exam will have pt start mobic 7.5 mg daily for 1 week  -zanaflex 2 mg by mouth every 6 hours as needed for muscle spasms.  -he plans on also following up on chiropractor for pain which has been effective in the past.  -can continue to use muscle rub PRN   2. Chronic left-sided low back pain with left-sided sciatica -chronic, mobic daily for 7 days for low back pain. Also can make follow up with chiropractor to help with pain management.  3. Primary hypertension -elevated today but improved with review of blood pressures on log.  -continue dietary modifications.   Carlos American. [place], [facility] [phone]

## 2021-12-23 DIAGNOSIS — M9902 Segmental and somatic dysfunction of thoracic region: Secondary | ICD-10-CM | POA: Diagnosis not present

## 2021-12-23 DIAGNOSIS — M9903 Segmental and somatic dysfunction of lumbar region: Secondary | ICD-10-CM | POA: Diagnosis not present

## 2021-12-23 DIAGNOSIS — M9901 Segmental and somatic dysfunction of cervical region: Secondary | ICD-10-CM | POA: Diagnosis not present

## 2021-12-23 DIAGNOSIS — M9904 Segmental and somatic dysfunction of sacral region: Secondary | ICD-10-CM | POA: Diagnosis not present

## 2021-12-27 ENCOUNTER — Encounter: Payer: PPO | Admitting: Speech Pathology

## 2021-12-29 ENCOUNTER — Ambulatory Visit: Payer: PPO | Attending: Infectious Diseases | Admitting: Infectious Diseases

## 2021-12-29 ENCOUNTER — Encounter: Payer: PPO | Admitting: Speech Pathology

## 2021-12-29 DIAGNOSIS — S81811D Laceration without foreign body, right lower leg, subsequent encounter: Secondary | ICD-10-CM | POA: Insufficient documentation

## 2021-12-29 DIAGNOSIS — T148XXA Other injury of unspecified body region, initial encounter: Secondary | ICD-10-CM | POA: Diagnosis not present

## 2021-12-29 DIAGNOSIS — B9561 Methicillin susceptible Staphylococcus aureus infection as the cause of diseases classified elsewhere: Secondary | ICD-10-CM | POA: Diagnosis not present

## 2021-12-29 DIAGNOSIS — L089 Local infection of the skin and subcutaneous tissue, unspecified: Secondary | ICD-10-CM

## 2021-12-29 DIAGNOSIS — A4901 Methicillin susceptible Staphylococcus aureus infection, unspecified site: Secondary | ICD-10-CM

## 2021-12-29 NOTE — Progress Notes (Signed)
The purpose of this virtual visit is to provide medical care while limiting exposure to the novel coronavirus (COVID19) for both patient and office staff.   Consent was obtained for video visit:  Yes.   Answered questions that patient had about telehealth interaction:  Yes.   I discussed the limitations, risks, security and privacy concerns of performing an evaluation and management service by telephone. I also discussed with the patient that there may be a patient responsible charge related to this service. The patient expressed understanding and agreed to proceed.   Patient Location: [facility] Provider Location: office On the visit patient, his wife and his nurse Follow up visit for rt shin wound due to MSSA [facility] for for a rt shin wound which was infected with MSSA- he sustained the wound in his attic by scraping on a board- He underwent initally sutures for the laceration, and was admitted after a couple of days for redness and had to undergo surgery - the wound was debrided MSSA in culture- he initially received IV broad spectrum and then narrowed to IV cefazolin and was discharged on PO keflex '500mg'$  Po q6 to [facility]- he has bene on keflex since then I did a video visit 2 weeks ago And gaain tody He is doing well Ambulating No pain , no fever Today os the last day for keflex O/e he looks well    Wound on the shin- no surrounding erythema  HE is going home today ( which is [facility])  Impression/recommendation-  MSSA infected wound Stop keflex Upload pictures weekly If getting worse after stopping antibiotic call immediately Follow up with surgeon H/o RT TKA- full function- no evidence of infection Discussed the management with patient and his wife in detail Follow PRN Total time 15 min

## 2021-12-30 DIAGNOSIS — M9902 Segmental and somatic dysfunction of thoracic region: Secondary | ICD-10-CM | POA: Diagnosis not present

## 2021-12-30 DIAGNOSIS — M9901 Segmental and somatic dysfunction of cervical region: Secondary | ICD-10-CM | POA: Diagnosis not present

## 2021-12-30 DIAGNOSIS — M9903 Segmental and somatic dysfunction of lumbar region: Secondary | ICD-10-CM | POA: Diagnosis not present

## 2021-12-30 DIAGNOSIS — M9904 Segmental and somatic dysfunction of sacral region: Secondary | ICD-10-CM | POA: Diagnosis not present

## 2022-01-01 ENCOUNTER — Encounter: Payer: Self-pay | Admitting: Infectious Diseases

## 2022-01-02 ENCOUNTER — Ambulatory Visit (INDEPENDENT_AMBULATORY_CARE_PROVIDER_SITE_OTHER): Payer: PPO | Admitting: Nurse Practitioner

## 2022-01-02 ENCOUNTER — Encounter (INDEPENDENT_AMBULATORY_CARE_PROVIDER_SITE_OTHER): Payer: PPO

## 2022-01-02 NOTE — Telephone Encounter (Signed)
Patient discharged to SNF.

## 2022-01-02 NOTE — Telephone Encounter (Signed)
Voicemail received from patient's wife [name] (left on 12/30/21).  Stated the patient's wound was recently re-dressed, and the wound bed appears improved but periwound area is redder. States the RN at [facility] is wondering if the patient should be on additional antibiotics; wife requests advice. States the patient recently finished a course of antibiotics.  Routed to provider and Epic message also sent.  Best phone number for [name]: [phone]  Binnie Kand, RN

## 2022-01-03 ENCOUNTER — Encounter: Payer: PPO | Admitting: Speech Pathology

## 2022-01-03 DIAGNOSIS — J449 Chronic obstructive pulmonary disease, unspecified: Secondary | ICD-10-CM | POA: Diagnosis not present

## 2022-01-03 DIAGNOSIS — Z741 Need for assistance with personal care: Secondary | ICD-10-CM | POA: Diagnosis not present

## 2022-01-03 DIAGNOSIS — G309 Alzheimer's disease, unspecified: Secondary | ICD-10-CM | POA: Diagnosis not present

## 2022-01-03 DIAGNOSIS — M6281 Muscle weakness (generalized): Secondary | ICD-10-CM | POA: Diagnosis not present

## 2022-01-03 DIAGNOSIS — N183 Chronic kidney disease, stage 3 unspecified: Secondary | ICD-10-CM | POA: Diagnosis not present

## 2022-01-03 DIAGNOSIS — R2689 Other abnormalities of gait and mobility: Secondary | ICD-10-CM | POA: Diagnosis not present

## 2022-01-03 DIAGNOSIS — S81801D Unspecified open wound, right lower leg, subsequent encounter: Secondary | ICD-10-CM | POA: Diagnosis not present

## 2022-01-03 DIAGNOSIS — L03115 Cellulitis of right lower limb: Secondary | ICD-10-CM | POA: Diagnosis not present

## 2022-01-03 DIAGNOSIS — E785 Hyperlipidemia, unspecified: Secondary | ICD-10-CM | POA: Diagnosis not present

## 2022-01-03 DIAGNOSIS — B9561 Methicillin susceptible Staphylococcus aureus infection as the cause of diseases classified elsewhere: Secondary | ICD-10-CM | POA: Diagnosis not present

## 2022-01-03 DIAGNOSIS — R262 Difficulty in walking, not elsewhere classified: Secondary | ICD-10-CM | POA: Diagnosis not present

## 2022-01-03 DIAGNOSIS — R278 Other lack of coordination: Secondary | ICD-10-CM | POA: Diagnosis not present

## 2022-01-03 DIAGNOSIS — I2510 Atherosclerotic heart disease of native coronary artery without angina pectoris: Secondary | ICD-10-CM | POA: Diagnosis not present

## 2022-01-03 DIAGNOSIS — B9562 Methicillin resistant Staphylococcus aureus infection as the cause of diseases classified elsewhere: Secondary | ICD-10-CM | POA: Diagnosis not present

## 2022-01-03 DIAGNOSIS — F0283 Dementia in other diseases classified elsewhere, unspecified severity, with mood disturbance: Secondary | ICD-10-CM | POA: Diagnosis not present

## 2022-01-03 DIAGNOSIS — R4189 Other symptoms and signs involving cognitive functions and awareness: Secondary | ICD-10-CM | POA: Diagnosis not present

## 2022-01-03 DIAGNOSIS — R2681 Unsteadiness on feet: Secondary | ICD-10-CM | POA: Diagnosis not present

## 2022-01-04 ENCOUNTER — Ambulatory Visit (INDEPENDENT_AMBULATORY_CARE_PROVIDER_SITE_OTHER): Payer: PPO | Admitting: Internal Medicine

## 2022-01-04 ENCOUNTER — Encounter: Payer: Self-pay | Admitting: Internal Medicine

## 2022-01-04 VITALS — BP 122/54 | HR 84 | Temp 98.0°F | Ht 74.0 in | Wt 171.4 lb

## 2022-01-04 DIAGNOSIS — E440 Moderate protein-calorie malnutrition: Secondary | ICD-10-CM

## 2022-01-04 DIAGNOSIS — R29898 Other symptoms and signs involving the musculoskeletal system: Secondary | ICD-10-CM

## 2022-01-04 DIAGNOSIS — R944 Abnormal results of kidney function studies: Secondary | ICD-10-CM

## 2022-01-04 DIAGNOSIS — L089 Local infection of the skin and subcutaneous tissue, unspecified: Secondary | ICD-10-CM | POA: Diagnosis not present

## 2022-01-04 DIAGNOSIS — G309 Alzheimer's disease, unspecified: Secondary | ICD-10-CM | POA: Diagnosis not present

## 2022-01-04 DIAGNOSIS — I739 Peripheral vascular disease, unspecified: Secondary | ICD-10-CM | POA: Diagnosis not present

## 2022-01-04 DIAGNOSIS — S81801S Unspecified open wound, right lower leg, sequela: Secondary | ICD-10-CM | POA: Diagnosis not present

## 2022-01-04 DIAGNOSIS — T148XXA Other injury of unspecified body region, initial encounter: Secondary | ICD-10-CM

## 2022-01-04 DIAGNOSIS — F0283 Dementia in other diseases classified elsewhere, unspecified severity, with mood disturbance: Secondary | ICD-10-CM | POA: Diagnosis not present

## 2022-01-04 LAB — COMPREHENSIVE METABOLIC PANEL
ALT: 17 U/L (ref 0–53)
AST: 19 U/L (ref 0–37)
Albumin: 4 g/dL (ref 3.5–5.2)
Alkaline Phosphatase: 56 U/L (ref 39–117)
BUN: 34 mg/dL — ABNORMAL HIGH (ref 6–23)
CO2: 29 mEq/L (ref 19–32)
Calcium: 9.1 mg/dL (ref 8.4–10.5)
Chloride: 104 mEq/L (ref 96–112)
Creatinine, Ser: 1.41 mg/dL (ref 0.40–1.50)
GFR: 43.85 mL/min — ABNORMAL LOW (ref >60.00)
Glucose, Bld: 106 mg/dL — ABNORMAL HIGH (ref 70–99)
Potassium: 4.7 mEq/L (ref 3.5–5.1)
Sodium: 139 mEq/L (ref 135–145)
Total Bilirubin: 0.9 mg/dL (ref 0.2–1.2)
Total Protein: 6.2 g/dL (ref 6.0–8.3)

## 2022-01-04 LAB — CBC WITH DIFFERENTIAL/PLATELET
Basophils Absolute: 0 10*3/uL (ref 0.0–0.1)
Basophils Relative: 0.6 % (ref 0.0–3.0)
Eosinophils Absolute: 0.2 10*3/uL (ref 0.0–0.7)
Eosinophils Relative: 3.3 % (ref 0.0–5.0)
HCT: 38.5 % — ABNORMAL LOW (ref 39.0–52.0)
Hemoglobin: 12.9 g/dL — ABNORMAL LOW (ref 13.0–17.0)
Lymphocytes Relative: 18.3 % (ref 12.0–46.0)
Lymphs Abs: 1.2 10*3/uL (ref 0.7–4.0)
MCHC: 33.5 g/dL (ref 30.0–36.0)
MCV: 93.3 fl (ref 78.0–100.0)
Monocytes Absolute: 0.5 10*3/uL (ref 0.1–1.0)
Monocytes Relative: 7.8 % (ref 3.0–12.0)
Neutro Abs: 4.6 10*3/uL (ref 1.4–7.7)
Neutrophils Relative %: 70 % (ref 43.0–77.0)
Platelets: 275 10*3/uL (ref 150.0–400.0)
RBC: 4.12 Mil/uL — ABNORMAL LOW (ref 4.22–5.81)
RDW: 15.4 % (ref 11.5–15.5)
WBC: 6.6 10*3/uL (ref 4.0–10.5)

## 2022-01-04 MED ORDER — ARIPIPRAZOLE 2 MG PO TABS: 2.0000 mg | ORAL_TABLET | Freq: Every day | ORAL | 2 refills | Status: DC

## 2022-01-04 NOTE — Assessment & Plan Note (Signed)
arciept was added,  Then increased to 10 mg in May by Dr Manuella Ghazi  Did not help his cognitive state or his vivid disturbing nightmares.  He remains very depressed.  Adding abilify 2 mfg,  Continue wellbutrin 150 mg daily follow up in  one month

## 2022-01-04 NOTE — Assessment & Plan Note (Signed)
Complicated by arterial insufficiency  , moderate by July 2023 ABI's.  Referring to wound clinic for ongoing care.  Advised patient to reschedule the vascular appt that he deferred

## 2022-01-04 NOTE — Assessment & Plan Note (Signed)
Aggravated by prlonged hospitalization.  Continue PT 3/week at [facility]

## 2022-01-04 NOTE — Progress Notes (Signed)
Subjective:  Patient ID: [name], male    DOB: 1929/06/06  Age: 88 y.o. MRN: [id number]  CC: The primary encounter diagnosis was Leg wound, right, sequela. 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   HPI BRAUN ROCCA presents for hospital follow up Chief Complaint  Patient presents with   Hospitalization Follow-up   Jacen is a 88 yr old male with PAD moderate on the right, severe on the left   who was hospitalized  on Sept 18 with sepsis and AKI secondary to cellulitis of an  infected leg wound of the right shin  .  He was debrieded  treated with several rounds oaf abx and Discharged to  [facility]  for wound care and oral antibiotics which he finished on oct 19  and he was   , discharged on oct 19 to home .  Has had follow up virtually with ID Ravishankar,  no worsening infection based on serial photographs taken by [name], his wife.  [facility] is providing wound care but requests a referral to the wound center .  last ABI July  was scheduled for repeat but was deferred by patient  .ABI's in chart.    Dressing changedd .  Taking a probiotic  getting PT   MWF,  for strength, feels it is going well.  Seeing OT today for evaluation of home for aids.   Positive depressio screen . Feels guilty for causing the injury and causing increased responsibilities on [place].  Cc:   bad,   vivid  dreams:  has been going on for years.  Started on aricept(?) by Dr Manuella Ghazi   Outpatient Medications Prior to Visit  Medication Sig Dispense Refill   ascorbic acid (VITAMIN C) 500 MG tablet Take 500 mg by mouth daily.     buPROPion (WELLBUTRIN SR) 150 MG 12 hr tablet Take 150 mg by mouth daily.     chlorhexidine (HIBICLENS) 4 % external liquid Apply 1 Application topically as directed.  Apply to Right lower leg topically every day shift for use to clean and flush wound for 7 Days     Cholecalciferol (VITAMIN D3) 50 MCG (2000 UT) TABS Take 1 tablet by mouth daily.     ezetimibe (ZETIA) 10 MG tablet TAKE 1 TABLET BY MOUTH EVERY DAY 90 tablet 0   levothyroxine (SYNTHROID) 100 MCG tablet TAKE 1 TABLET BY MOUTH EVERY DAY BEFORE BREAKFAST 90 tablet 0   losartan (COZAAR) 25 MG tablet Take 1 tablet (25 mg total) by mouth daily. 90 tablet    magnesium oxide (MAG-OX) 400 MG tablet Take 400 mg by mouth daily.     mirtazapine (REMERON) 15 MG tablet Take 1 tablet (15 mg total) by mouth at bedtime.     rosuvastatin (CRESTOR) 10 MG tablet Take 1 tablet (10 mg total) by mouth daily. 90 tablet 1   Saccharomyces boulardii (PROBIOTIC) 250 MG CAPS Take 1 capsule by mouth daily at 12 noon.     senna (SENOKOT) 8.6 MG TABS tablet Take 2 tablets by mouth at bedtime. For bowel regimen     tiZANidine (ZANAFLEX) 2 MG tablet Take by mouth every 6 (six) hours as needed for muscle spasms.     acetaminophen (TYLENOL) 500 MG tablet Take 1,000 mg by mouth daily. As needed for pain r/t wound  care give 30 min prior to wound change (Patient not taking: Reported on 01/04/2022)     cetaphil (CETAPHIL) lotion Apply 1 Application topically every 8 (eight) hours as needed for dry skin (For itchiness). (Patient not taking: Reported on 01/04/2022)     cycloSPORINE (RESTASIS) 0.05 % ophthalmic emulsion Place 1 drop into both eyes 2 (two) times daily. (Patient not taking: Reported on 01/04/2022)     meloxicam (MOBIC) 7.5 MG tablet Take 1 tablet (7.5 mg total) by mouth daily. (Patient not taking: Reported on 01/04/2022) 30 tablet 0   testosterone cypionate (DEPOTESTOSTERONE CYPIONATE) 200 MG/ML injection Inject 1 mL (200 mg total) into the muscle every 14 (fourteen) days. (Patient not taking: Reported on 01/04/2022) 1 mL 2   vitamin B-12 (CYANOCOBALAMIN) 500 MCG tablet Take 1 tablet by mouth daily. (Patient not taking: Reported  on 01/04/2022)     Witch Hazel (PREPARATION H SOOTHING RELIEF EX) Apply topically. Apply to Rectal area topically every 24 hours as needed for discomfort (Patient not taking: Reported on 01/04/2022)     No facility-administered medications prior to visit.    Review of Systems;  Patient denies headache, fevers, malaise, unintentional weight loss, skin rash, eye pain, sinus congestion and sinus pain, sore throat, dysphagia,  hemoptysis , cough, dyspnea, wheezing, chest pain, palpitations, orthopnea, edema, abdominal pain, nausea, melena, diarrhea, constipation, flank pain, dysuria, hematuria, urinary  Frequency, nocturia, numbness, tingling, seizures,  Focal weakness, Loss of consciousness,  Tremor, insomnia, depression, anxiety, and suicidal ideation.      Objective:  BP (!) 122/54 (BP Location: Left Arm, Patient Position: Sitting, Cuff Size: Normal)   Pulse 84   Temp 98 F (36.7 C) (Oral)   Ht '6\' 2"'$  (1.88 m)   Wt 171 lb 6.4 oz (77.7 kg)   SpO2 93%   BMI 22.01 kg/m   BP Readings from Last 3 Encounters:  01/04/22 (!) 122/54  12/22/21 134/73  12/22/21 (!) 161/81    Wt Readings from Last 3 Encounters:  01/04/22 171 lb 6.4 oz (77.7 kg)  12/22/21 172 lb (78 kg)  12/22/21 168 lb 6.4 oz (76.4 kg)    General appearance: alert, cooperative and appears stated age Ears: normal TM's and external ear canals both ears Throat: lips, mucosa, and tongue normal; teeth and gums normal Neck: no adenopathy, no carotid bruit, supple, symmetrical, trachea midline and thyroid not enlarged, symmetric, no tenderness/mass/nodules Back: symmetric, no curvature. ROM normal. No CVA tenderness. Lungs: clear to auscultation bilaterally Heart: regular rate and rhythm, S1, S2 normal, no murmur, click, rub or gallop Abdomen: soft, non-tender; bowel sounds normal; no masses,  no organomegaly Pulses: 2+ and symmetric Skin: right leg dressed in multi layer wound dressing.    Reviewed pictures of right LE wound  with granulation tissue noted on medial side,  lateral side with exposed tendon  and slough ,  no drainage or erythema  Ex:  non palpable DP pulses  Lymph nodes: Cervical, supraclavicular, and axillary nodes normal. Neuro:  awake and interactive with normal mood and affect. Higher cortical functions are normal. Speech is clear without word-finding difficulty or dysarthria. Extraocular movements are intact. Visual fields of both eyes are grossly intact. Sensation to light touch is grossly intact bilaterally of upper and lower extremities. Motor examination shows 4+/5 symmetric hand grip and upper extremity and 5/5 lower extremity strength. There is no pronation or drift. Gait is non-ataxic   Lab Results  Component Value Date   HGBA1C 4.8 01/17/2021   HGBA1C 4.7  05/28/2018    Lab Results  Component Value Date   CREATININE 1.14 12/05/2021   CREATININE 1.14 12/03/2021   CREATININE 1.13 12/02/2021    Lab Results  Component Value Date   WBC 6.2 12/05/2021   HGB 11.8 (L) 12/05/2021   HCT 35.6 (L) 12/05/2021   PLT 349 12/05/2021   GLUCOSE 97 12/05/2021   CHOL 197 09/21/2020   TRIG 70.0 09/21/2020   HDL 62.00 09/21/2020   LDLCALC 121 (H) 09/21/2020   ALT 43 12/05/2021   AST 57 (H) 12/05/2021   NA 138 12/05/2021   K 3.9 12/05/2021   CL 104 12/05/2021   CREATININE 1.14 12/05/2021   BUN 22 12/05/2021   CO2 27 12/05/2021   TSH 0.97 08/26/2021   PSA 21.68 (H) 02/11/2021   INR 1.4 (H) 11/29/2021   HGBA1C 4.8 01/17/2021    DG Tibia/Fibula Right  Result Date: 11/28/2021 CLINICAL DATA:  Cellulitis right lower leg. Infection. Leg injury a few days ago and had stitches placed. Bandage in place at this time. EXAM: RIGHT TIBIA AND FIBULA - 2 VIEW COMPARISON:  None Available. FINDINGS: Postsurgical changes of total right knee arthroplasty. The superior aspect of the distal femoral hardware is seen on lateral view only. No perihardware lucency is seen to indicate hardware failure or loosening.  There are 2 small chronic ossicles at the superior aspect of the tibial tubercle. No overlying anterior soft tissue swelling. Mild distal medial and lateral malleolar degenerative osteophytes at the ankle. No cortical erosion. No acute fracture is seen. No dislocation. Mild vascular calcifications. IMPRESSION: 1. Status post total right knee arthroplasty. No evidence of hardware failure. 2. No cortical erosion to indicate radiographic evidence of acute osteomyelitis. Electronically Signed   By: Yvonne Kendall M.D.   On: 11/28/2021 13:41    Assessment & Plan:   Problem List Items Addressed This Visit     Wound infection, posttraumatic    Complicated by arterial insufficiency  , moderate by July 2023 ABI's.  Referring to wound clinic for ongoing care.  Advised patient to reschedule the vascular appt that he deferred       Weakness of both lower extremities    Aggravated by prlonged hospitalization.  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  He remains very depressed.  Adding abilify 2 mfg,  Continue wellbutrin 150 mg daily follow up in  one month       Relevant Medications   tiZANidine (ZANAFLEX) 2 MG tablet   ARIPiprazole (ABILIFY) 2 MG tablet   Other Visit Diagnoses     Leg wound, right, sequela    -  Primary   Relevant Orders   Ambulatory referral to Wound Clinic   For home use only DME Other see comment   CBC with Differential/Platelet       I spent a total of  43   minutes with this patient in a face to face visit on the date of this encounter reviewing the  recent hospitalization   most recent  visit with cvascular surgery ,  patient's mood ,  all hospital imaging and labs  and post visit ordering of testing and therapeutics.    Follow-up: Return in about 4 weeks (around 02/01/2022).   [name], MD

## 2022-01-04 NOTE — Patient Instructions (Addendum)
I am adding Abilify to help the wellbutrin  work better   Do not resume aricept since it did not help    I have made a referral to [facility]   Please reschedule your appt with Vascular Surgery to have your circulation rechecked   Elevate leg as much as possible,  especially when sleeping   Make sure protein drink has 30 grams in it to help wound heal

## 2022-01-05 ENCOUNTER — Encounter: Payer: PPO | Admitting: Speech Pathology

## 2022-01-05 NOTE — Addendum Note (Signed)
Addended by: [name] on: 01/05/2022 08:03 PM   Modules accepted: Orders

## 2022-01-06 ENCOUNTER — Telehealth: Payer: Self-pay

## 2022-01-06 DIAGNOSIS — M9903 Segmental and somatic dysfunction of lumbar region: Secondary | ICD-10-CM | POA: Diagnosis not present

## 2022-01-06 DIAGNOSIS — M9902 Segmental and somatic dysfunction of thoracic region: Secondary | ICD-10-CM | POA: Diagnosis not present

## 2022-01-06 DIAGNOSIS — M9901 Segmental and somatic dysfunction of cervical region: Secondary | ICD-10-CM | POA: Diagnosis not present

## 2022-01-06 DIAGNOSIS — M9904 Segmental and somatic dysfunction of sacral region: Secondary | ICD-10-CM | POA: Diagnosis not present

## 2022-01-06 NOTE — Telephone Encounter (Signed)
LMTCB. Need to schedule pt for a lab appt in 2 weeks per result note message.

## 2022-01-09 ENCOUNTER — Encounter (INDEPENDENT_AMBULATORY_CARE_PROVIDER_SITE_OTHER): Payer: Self-pay

## 2022-01-10 ENCOUNTER — Other Ambulatory Visit: Payer: Self-pay | Admitting: Family Medicine

## 2022-01-10 ENCOUNTER — Encounter: Payer: PPO | Admitting: Speech Pathology

## 2022-01-10 DIAGNOSIS — R972 Elevated prostate specific antigen [PSA]: Secondary | ICD-10-CM

## 2022-01-11 ENCOUNTER — Other Ambulatory Visit: Payer: PPO

## 2022-01-11 DIAGNOSIS — M6281 Muscle weakness (generalized): Secondary | ICD-10-CM | POA: Diagnosis not present

## 2022-01-11 DIAGNOSIS — R278 Other lack of coordination: Secondary | ICD-10-CM | POA: Diagnosis not present

## 2022-01-11 DIAGNOSIS — F0283 Dementia in other diseases classified elsewhere, unspecified severity, with mood disturbance: Secondary | ICD-10-CM | POA: Diagnosis not present

## 2022-01-11 DIAGNOSIS — J449 Chronic obstructive pulmonary disease, unspecified: Secondary | ICD-10-CM | POA: Diagnosis not present

## 2022-01-11 DIAGNOSIS — L03115 Cellulitis of right lower limb: Secondary | ICD-10-CM | POA: Diagnosis not present

## 2022-01-11 DIAGNOSIS — R2681 Unsteadiness on feet: Secondary | ICD-10-CM | POA: Diagnosis not present

## 2022-01-11 DIAGNOSIS — S81801D Unspecified open wound, right lower leg, subsequent encounter: Secondary | ICD-10-CM | POA: Diagnosis not present

## 2022-01-11 DIAGNOSIS — R2689 Other abnormalities of gait and mobility: Secondary | ICD-10-CM | POA: Diagnosis not present

## 2022-01-11 DIAGNOSIS — R972 Elevated prostate specific antigen [PSA]: Secondary | ICD-10-CM

## 2022-01-11 DIAGNOSIS — G309 Alzheimer's disease, unspecified: Secondary | ICD-10-CM | POA: Diagnosis not present

## 2022-01-11 DIAGNOSIS — R4189 Other symptoms and signs involving cognitive functions and awareness: Secondary | ICD-10-CM | POA: Diagnosis not present

## 2022-01-11 DIAGNOSIS — B9561 Methicillin susceptible Staphylococcus aureus infection as the cause of diseases classified elsewhere: Secondary | ICD-10-CM | POA: Diagnosis not present

## 2022-01-11 DIAGNOSIS — B9562 Methicillin resistant Staphylococcus aureus infection as the cause of diseases classified elsewhere: Secondary | ICD-10-CM | POA: Diagnosis not present

## 2022-01-11 DIAGNOSIS — Z741 Need for assistance with personal care: Secondary | ICD-10-CM | POA: Diagnosis not present

## 2022-01-12 ENCOUNTER — Encounter: Payer: PPO | Admitting: Speech Pathology

## 2022-01-12 ENCOUNTER — Other Ambulatory Visit: Payer: Self-pay | Admitting: Family

## 2022-01-12 LAB — PSA: Prostate Specific Ag, Serum: 17.5 ng/mL — ABNORMAL HIGH (ref 0.0–4.0)

## 2022-01-13 ENCOUNTER — Encounter: Payer: PPO | Attending: Physician Assistant | Admitting: Physician Assistant

## 2022-01-13 DIAGNOSIS — I2510 Atherosclerotic heart disease of native coronary artery without angina pectoris: Secondary | ICD-10-CM | POA: Diagnosis not present

## 2022-01-13 DIAGNOSIS — I70239 Atherosclerosis of native arteries of right leg with ulceration of unspecified site: Secondary | ICD-10-CM | POA: Insufficient documentation

## 2022-01-13 DIAGNOSIS — J449 Chronic obstructive pulmonary disease, unspecified: Secondary | ICD-10-CM | POA: Diagnosis not present

## 2022-01-13 DIAGNOSIS — G250 Essential tremor: Secondary | ICD-10-CM | POA: Diagnosis not present

## 2022-01-13 DIAGNOSIS — M9902 Segmental and somatic dysfunction of thoracic region: Secondary | ICD-10-CM | POA: Diagnosis not present

## 2022-01-13 DIAGNOSIS — S81811A Laceration without foreign body, right lower leg, initial encounter: Secondary | ICD-10-CM | POA: Diagnosis not present

## 2022-01-13 DIAGNOSIS — M9904 Segmental and somatic dysfunction of sacral region: Secondary | ICD-10-CM | POA: Diagnosis not present

## 2022-01-13 DIAGNOSIS — M9903 Segmental and somatic dysfunction of lumbar region: Secondary | ICD-10-CM | POA: Diagnosis not present

## 2022-01-13 DIAGNOSIS — L97812 Non-pressure chronic ulcer of other part of right lower leg with fat layer exposed: Secondary | ICD-10-CM | POA: Insufficient documentation

## 2022-01-13 DIAGNOSIS — M9901 Segmental and somatic dysfunction of cervical region: Secondary | ICD-10-CM | POA: Diagnosis not present

## 2022-01-13 DIAGNOSIS — N1831 Chronic kidney disease, stage 3a: Secondary | ICD-10-CM | POA: Diagnosis not present

## 2022-01-13 NOTE — Progress Notes (Signed)
LEVIS, NAZIR [name] ([id number]) [id number] Page 1 of 5 Visit Report for 01/13/2022 Abuse Risk Screen Details Patient Name: Date of Service: Theodore [name], Theodore [name] 01/13/2022 8:30 [name] Medical Record Number: [id number] Patient Account Number: [id number] Date of Birth/Sex: Treating RN: February 12, 1930 (88 y.o. [facility] Primary Care Giovanie Lefebre: [name] Other Clinician: Referring Dietra Stokely: Treating Shawntel Farnworth/Extender: [name] Weeks in Treatment: 0 Abuse Risk Screen Items Answer ABUSE RISK SCREEN: Has anyone close to you tried to hurt or harm you recentlyo No Do you feel uncomfortable with anyone in your familyo No Has anyone forced you do things that you didnt want to doo No Electronic Signature(s) Signed: 01/13/2022 10:56:51 AM By: Rosalio Loud MSN RN CNS WTA Entered By: Rosalio Loud on 01/13/2022 10:56:51 -------------------------------------------------------------------------------- Activities of Daily Living Details Patient Name: Date of Service: Theodore [name], Theodore [name] 01/13/2022 8:30 [name] Medical Record Number: [id number] Patient Account Number: [id number] Date of Birth/Sex: Treating RN: Nov 11, 1929 (88 y.o. [facility] Meth Primary Care Noach Calvillo: [name] Other Clinician: Referring Glenisha Gundry: Treating Zamirah Denny/Extender: [name] Weeks in Treatment: 0 Activities of Daily Living Items Answer Activities of Daily Living (Please select one for each item) Drive Automobile Not Able T Medications ake Need Assistance Use T elephone Completely Able Care for Appearance Completely Able Use T oilet Completely Able Bath / Shower Completely Able Dress Self Completely Able Feed Self Completely Able Walk Completely Able Get In / Out Bed Completely Able Housework Completely DANG, MATHISON [name] ([id number]) [phone] [id number] Page 2 of 5 Prepare Meals Completely Able Handle Money Completely Able Shop for  Self Completely Able Electronic Signature(s) Signed: 01/13/2022 10:56:59 AM By: Rosalio Loud MSN RN CNS WTA Entered By: Rosalio Loud on 01/13/2022 10:56:58 -------------------------------------------------------------------------------- Education Screening Details Patient Name: Date of Service: Theodore [name], [name]. 01/13/2022 8:30 [name] Medical Record Number: [id number] Patient Account Number: [id number] Date of Birth/Sex: Treating RN: 1929/07/16 (88 y.o. [facility] Meth Primary Care Damarian Priola: [name] Other Clinician: Referring Florenda Watt: Treating Naquita Nappier/Extender: [name] in Treatment: 0 Learning Preferences/Education Level/Primary Language Learning Preference: Demonstration Highest Education Level: College or Above Preferred Language: English Cognitive Barrier Language Barrier: No Translator Needed: No Memory Deficit: No Emotional Barrier: No Cultural/Religious Beliefs Affecting Medical Care: No Physical Barrier Impaired Vision: Yes Glasses Impaired Hearing: Yes Hearing Aid Decreased Hand dexterity: No Knowledge/Comprehension Knowledge Level: Medium Comprehension Level: Medium Ability to understand written instructions: Medium Ability to understand verbal instructions: Medium Motivation Anxiety Level: Calm Cooperation: Cooperative Education Importance: Acknowledges Need Interest in Health Problems: Asks Questions Perception: Coherent Willingness to Engage in Self-Management High Activities: Readiness to Engage in Self-Management High Activities: Electronic Signature(s) Signed: 01/13/2022 10:57:18 AM By: Rosalio Loud MSN RN CNS WTA Entered By: Rosalio Loud on 01/13/2022 10:57:18 Lyter, Jameek [name] ([id number]) [id number] Page 3 of 5 -------------------------------------------------------------------------------- Fall Risk Assessment Details Patient Name: Date of Service: Theodore [name], Theodore [name] 01/13/2022 8:30 [name] Medical  Record Number: [id number] Patient Account Number: [id number] Date of Birth/Sex: Treating RN: 05-17-1929 (88 y.o. [facility] Meth Primary Care Cincere Zorn: [name] Other Clinician: Referring Tabbitha Janvrin: Treating Delara Shepheard/Extender: [name] Weeks in Treatment: 0 Fall Risk Assessment Items Have you had 2 or more falls in the last 12 monthso 0 Yes Have you had any fall that resulted in injury in the last 12 monthso 0 Yes FALLS RISK SCREEN History of falling - immediate or within 3 months 25 Yes Secondary diagnosis (Do you have 2 or more  medical diagnoseso) 0 No Ambulatory aid None/bed rest/wheelchair/nurse 0 No Crutches/cane/walker 15 Yes Furniture 0 No Intravenous therapy Access/Saline/Heparin Lock 0 No Gait/Transferring Normal/ bed rest/ wheelchair 0 No Weak (short steps with or without shuffle, stooped but able to lift head while walking, may seek 0 No support from furniture) Impaired (short steps with shuffle, may have difficulty arising from chair, head down, impaired 0 No balance) Mental Status Oriented to own ability 0 Yes Electronic Signature(s) Signed: 01/13/2022 10:57:28 AM By: Rosalio Loud MSN RN CNS WTA Entered By: Rosalio Loud on 01/13/2022 10:57:28 -------------------------------------------------------------------------------- Foot Assessment Details Patient Name: Date of Service: Theodore [name], [name]. 01/13/2022 8:30 [name] Medical Record Number: [id number] Patient Account Number: [id number] Date of Birth/Sex: Treating RN: 1929/06/17 (88 y.o. [facility] Meth Primary Care Sender Rueb: [name] Other Clinician: Referring Seanmichael Salmons: Treating Aveyah Greenwood/Extender: [name] Weeks in Treatment: 0 Foot Assessment Items Site Locations Theodore [name], Theodore [name] ([id number]) [id number] Page 4 of 5 + = Sensation present, - = Sensation absent, C = Callus, U = Ulcer R = Redness, W = Warmth, M = Maceration, PU = Pre-ulcerative  lesion F = Fissure, S = Swelling, D = Dryness Assessment Right: Left: Other Deformity: No No Prior Foot Ulcer: No No Prior Amputation: No No Charcot Joint: No No Ambulatory Status: Ambulatory With Help Assistance Device: Walker GaitEnergy manager) Signed: 01/13/2022 10:57:52 AM By: Rosalio Loud MSN RN CNS WTA Entered By: Rosalio Loud on 01/13/2022 10:57:52 -------------------------------------------------------------------------------- Nutrition Risk Screening Details Patient Name: Date of Service: Theodore [name], Theodore [name]. 01/13/2022 8:30 [name] Medical Record Number: [id number] Patient Account Number: [id number] Date of Birth/Sex: Treating RN: 08/05/1929 (88 y.o. [facility] Meth Primary Care Caton Popowski: [name] Other Clinician: Referring Essie Gehret: Treating Miche Loughridge/Extender: [name] Weeks in Treatment: 0 Height (in): 75 Weight (lbs): 168 Body Mass Index (BMI): 21 Nutrition Risk Screening Items Score Screening NUTRITION RISK SCREEN: I have an illness or condition that made me change the kind and/or amount of food I eat 0 No I eat fewer than two meals per day 0 No I eat few fruits and vegetables, or milk products 0 No I have three or more drinks of beer, liquor or wine almost every day 0 No I have tooth or mouth problems that make it hard for me to eat 0 No Theodore [name], Theodore [name] ([id number]) [id number] [id number] Page 5 of 5 I don't always have enough money to buy the food I need 0 No I eat alone most of the time 0 No I take three or more different prescribed or over-the-counter drugs [name] day 0 No Without wanting to, I have lost or gained 10 pounds in the last six months 2 Yes I am not always physically able to shop, cook and/or feed myself 0 No Nutrition Protocols Good Risk Protocol 0 No interventions needed Moderate Risk Protocol High Risk Proctocol Risk Level: Good Risk Score: 2 Electronic Signature(s) Signed: 01/13/2022  10:57:39 AM By: Rosalio Loud MSN RN CNS WTA Entered By: Rosalio Loud on 01/13/2022 10:57:39

## 2022-01-16 NOTE — Progress Notes (Signed)
Theodore Theodore [name], Theodore Theodore [name] ([id number]) [id number] Page 1 of 8 Visit Report for 01/13/2022 Chief Complaint Document Details Patient Name: Date of Service: [name], Theodore [name] 01/13/2022 8:30 Theodore [name] Medical Record Number: [id number] Patient Account Number: [id number] Date of Birth/Sex: Treating RN: [birth year] (88 y.o. Seward Meth Primary Care Provider: [name] Other Clinician: Referring Provider: Treating Provider/Extender: [name] Weeks in Treatment: 0 Information Obtained from: Patient Chief Complaint Right LE Ulcer Electronic Signature(s) Signed: 01/13/2022 9:50:33 AM By: Worthy Keeler PA-C Entered By: Worthy Keeler on 01/13/2022 09:50:33 -------------------------------------------------------------------------------- HPI Details Patient Name: Date of Service: Theodore Theodore [name], Theodore Theodore [name]. 01/13/2022 8:30 Theodore [name] Medical Record Number: [id number] Patient Account Number: [id number] Date of Birth/Sex: Treating RN: Feb 10, 1930 (88 y.o. Seward Meth Primary Care Provider: [name] Other Clinician: Referring Provider: Treating Provider/Extender: [name] Weeks in Treatment: 0 History of Present Illness HPI Description: ADMISSION 10/03/2021 This is Theodore [name] 88 year old man who was pulling Theodore [name] cart 3 months ago he had some sort of imbalance issue hitting his left anterior lower leg. He is not sure exactly what he hit. He is able to show me photos on his phone that show Theodore [name] laceration on the left anterior lower leg in an inverted L-shaped and. He initially did very little to this himself. Saw his primary doctor on 09/21/2021 [place] instructed him to put Vaseline gauze on this which he did for short period of time. He says that things are getting Theodore [name] lot smaller and are improving he has not really been treating this with anything. The other major problem the patient has is peripheral arterial disease. He is followed by vein and vascular and has had Theodore [name] series  of follow-up arterial studies. Most recently this was done on 09/30/2021 showing on the left and ABI of 0.49 with Theodore [name] great toe pressure of 0.47 and monophasic waveforms. This is somewhat worse than test he had done on 03/21/2021 at which time the ABI was 0.68 TBI of 0.51. I think because of his age and the fact he has underlying stage IIIa chronic kidney disease and the fact the wound is healing they have not been scheduling him for anything aggressive but follow-up studies are apparently booked for 86-month Past medical history includes coronary artery disease, lumbar radiculopathy, COPD which is limiting and stage IIIa chronic renal failure. He has PAD as noted. He also tells me that he had Theodore [name] surgical procedure for essential tremor which left him with some mild dysarthria and gait and balance problems Theodore Theodore [name], Theodore Theodore [name] ([id number] Page 2 of 8 10-10-2021 upon evaluation today patient appears to be doing decently well in regard to his wound all things considered. He is very slow to heal due to his low ABIs. With that being said it does appear that he is making some progress nonetheless there is little bit of dry skin and tissue around the edges of the wound I think Theodore [name] very light debridement to clear this away could be of benefit for him. The patient is in agreement with that plan. For that reason I did actually perform debridement to clear that away today but did so extremely carefully. 10-17-2021 upon evaluation today patient appears to be doing well currently in regard to his wound in fact this appears to be completely healed based on what I am seeing today. I do not see any evidence of active infection locally or systemically which is great news. No fevers, chills, nausea, vomiting, or diarrhea. Readmission: 01-13-2022 patient  presents today for follow-up evaluation although the last time I saw him was actually August of this year. At that time he actually was  doing decently well with regard to the wound on the left leg. Unfortunately however it was noted that he had noted poor ABIs and again I somehow missed documented the right side at the previous admission but his right ABI was 0.61 with Theodore [name] TBI of 0.38. Based on what I am seeing today and considering his arterial flow which is not very good I do believe that he likely needs to be seen by vascular soon as possible I think that is contributing to his poor healing in regard to this right leg. I discussed that with him today and the individual who I am assuming to be Theodore [name] family member that was present with him as well although I did not inquire in order to confirm. 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 He subsequently had an injury where he sustained Theodore [name] laceration this was on November 24, 2021. He then ended up with Theodore [name] need for reopening this with incision and drainage on 11-29-2021. I did look through the pictures that his family member had as well from the beginning through now and there has been some improvement though Theodore [name] lot of the granulation tissue is somewhat hypergranular there is also some evidence that the bone is drying out of the central portion of the wound. Unfortunately I do not think that this is healing nearly as well as I would like to see. I believe his blood flow is affecting this significantly and I think that needs to be addressed ASAP. Otherwise the patient's past medical history really has not changed significantly since the last time I saw him. That can be referenced above. Electronic Signature(s) Signed: 01/13/2022 1:40:13 PM By: Worthy Keeler PA-C Entered By: Worthy Keeler on 01/13/2022 13:40:13 -------------------------------------------------------------------------------- Physical Exam Details Patient Name: Date of Service: Theodore Theodore [name], Theodore Theodore [name] 01/13/2022 8:30 Theodore [name] Medical Record Number: [id number] Patient Account Number: [id number] Date of  Birth/Sex: Treating RN: 1929-04-11 (88 y.o. Seward Meth Primary Care Provider: [name] Other Clinician: Referring Provider: Treating Provider/Extender: [name] Weeks in Treatment: 0 Constitutional sitting or standing blood pressure is within target range for patient.. pulse regular and within target range for patient.[name] respirations regular, non-labored and within target range for patient.[name] temperature within target range for patient.. Well-nourished and well-hydrated in no acute distress. Eyes conjunctiva clear no eyelid edema noted. pupils equal round and reactive to light and accommodation. Ears, Nose, Mouth, and Throat no gross abnormality of ear auricles or external auditory canals. normal hearing noted during conversation. mucus membranes moist. Respiratory normal breathing without difficulty. Cardiovascular Absent posterior tibial and dorsalis pedis pulses bilateral lower extremities. no clubbing, cyanosis, significant edema, <3 sec cap refill. Musculoskeletal normal gait and posture. no significant deformity or arthritic changes, no loss or range of motion, no clubbing. Psychiatric this patient is able to make decisions and demonstrates good insight into disease process. Alert and Oriented x 3. pleasant and cooperative. Notes Upon inspection patient's wound bed showed signs of some dry mist to the bone noted which is starting to show signs of breaking down in my opinion. With that being said I think that his poor arterial flow is significantly affecting his chances of being able to heal here and I do believe that he needs to have this evaluated and improved as soon as possible to try to give  him the best chance to get this to heal. This is obviously Theodore [name] definitive limb threatening situation if we are not proactive in this currently. He voiced understanding and notes that he has an appointment on the 15th with vein and vascular. With that being said  we attempted to call and get this moved to they were unable to movement as next week they are actually moving their office to Theodore [name] completely new location and did not have anything available to be able to see patients in the interim. I completely understand this is unavoidable. OLUWATOMISIN, DEMAN Theodore [name] ([id number]) [id number] Page 3 of 8 Electronic Signature(s) Signed: 01/13/2022 1:41:23 PM By: Worthy Keeler PA-C Entered By: Worthy Keeler on 01/13/2022 13:41:22 -------------------------------------------------------------------------------- Physician Orders Details Patient Name: Date of Service: Lagares, Theodore Theodore [name]. 01/13/2022 8:30 Theodore [name] Medical Record Number: [id number] Patient Account Number: [id number] Date of Birth/Sex: Treating RN: [birth year] (88 y.o. 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 Bathing/ [employer] wounds with antibacterial soap and water. - Wash wound with soap and water. Pat dry with gauze then apply Xeroform gauze over wound bed followed by Theodore [name] moistened Dakin's soaked gauze. Moisten with Dakin's only. DO NOT SATURATE. Cover with ABD pad and secure with Kerlix. DO NOT WRAP WITH COBAN. Secure Kerlix with netting or if not available Medipore tape. Wound Treatment Wound #2 - Lower Leg Wound Laterality: Right, Midline, Anterior Cleanser: Soap and Water 1 x Per Day/30 Days Discharge Instructions: Gently cleanse wound with antibacterial soap, rinse and pat dry prior to  dressing wounds Prim Dressing: Gauze 1 x Per Day/30 Days ary Discharge Instructions: As directed: dry, moistened with saline or moistened with Dakins Solution Prim Dressing: Xeroform 4x4-HBD (in/in) 1 x Per Day/30 Days ary Discharge Instructions: Apply Xeroform 4x4-HBD (in/in) as directed Secondary Dressing: ABD Pad 5x9 (in/in) 1 x Per Day/30 Days Discharge Instructions: Cover with ABD pad Secured With: Conform 4'' - Conforming Stretch Gauze Bandage 4x75 (in/in) 1 x Per Day/30 Days Discharge Instructions: Apply as directed Secured With: Kerlix Roll Sterile or Non-Sterile 6-ply 4.5x4 (yd/yd) 1 x Per Day/30 Days Discharge Instructions: Apply Kerlix as directed Electronic Signature(s) Signed: 01/13/2022 11:03:36 AM By: Rosalio Loud MSN RN CNS WTA Signed: 01/13/2022 1:54:42 PM By: Worthy Keeler PA-C Entered By: Rosalio Loud on 01/13/2022 11:03:35 Locher, Cornell Theodore [name] ([id number]) [id number] Page 4 of 8 -------------------------------------------------------------------------------- Problem List Details Patient Name: Date of Service: Theodore Theodore [name], Theodore Theodore [name] 01/13/2022 8:30 Theodore [name] Medical Record Number: [id number] Patient Account Number: [id number] Date of Birth/Sex: Treating RN: 1929/06/20 (88 y.o. 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 01/13/2022 8:30 Theodore [name] Medical Record Number: [id number] Patient Account Number: [id number] Date of Birth/Sex: Treating RN: 01-30-1930 (88 y.o. Sew[place] Meth Primary Care Provider: [name] Other Clinician: Referring Provider: Treating Provider/Extender: [name] Weeks in Treatment: 0 Subjective Cobert, Casmere Theodore [name] ([id number]) [id number] Page 5 of 8 Chief Complaint Information obtained from Patient Right LE Ulcer History of Present Illness (HPI) ADMISSION 10/03/2021 This is Theodore [name] 88 year old man who was pulling Theodore [name] cart 3 months ago he had some sort of imbalance issue hitting his left anterior lower leg. He is not sure exactly what he hit. He is able to show me photos on his phone that show Theodore [name] laceration on the left anterior lower leg in an inverted L-shaped and. He initially did very little to this himself. Saw his primary doctor on 09/21/2021 [place] instructed him to put Vaseline gauze on this which he did for short period of time. He says that things are getting Theodore [name] lot smaller and are improving he has not really been treating this with anything. The other major problem the patient has is peripheral arterial disease. He is followed by vein and vascular and has had Theodore [name] series of follow-up arterial studies. Most recently this was done on 09/30/2021 showing on the left and ABI of 0.49 with Theodore [name] great toe pressure of 0.47 and monophasic waveforms. This is somewhat worse than test he had done on 03/21/2021 at which time the ABI was 0.68 TBI of 0.51. I think because of his age and the fact he has  underlying stage IIIa chronic kidney disease and the fact the wound is healing they have not been scheduling him for anything aggressive but follow-up studies are apparently booked for 52-month Past medical history includes coronary artery disease, lumbar radiculopathy, COPD which is limiting and stage IIIa chronic renal failure. He has PAD as noted. He also tells me that he had Theodore [name] surgical procedure for essential tremor which left him with some mild dysarthria and gait and balance problems 10-10-2021 upon evaluation today patient appears to be doing decently well in regard to his wound all things considered. He is very slow to heal due to his low ABIs. With that being said it does appear that he is making some progress nonetheless there is little bit of dry skin and tissue around the edges of the wound I think Theodore [name] very light debridement to clear this away could be of benefit for him. The patient is in agreement with that plan. For that reason I did actually perform debridement to clear that away today but did so extremely carefully. 10-17-2021 upon evaluation today patient appears to be doing well currently in regard to his wound in fact this appears to be completely healed based on what I am seeing today. I do not see any evidence of active infection locally or systemically which is great news. No fevers, chills, nausea, vomiting, or diarrhea. Readmission: 01-13-2022 patient presents today for follow-up evaluation although the last time I saw him was actually August of this year. At that time he actually was doing decently well with regard to the wound on the left leg. Unfortunately however it was noted that he had noted poor ABIs and  again I somehow missed documented the right side at the previous admission but his right ABI was 0.61 with Theodore [name] TBI of 0.38. Based on what I am seeing today and considering his arterial flow which is not very good I do believe that he likely needs to be seen by vascular soon as  possible I think that is contributing to his poor healing in regard to this right leg. I discussed that with him today and the individual who I am assuming to be Theodore [name] family member that was present with him as well although I did not inquire in order to confirm. 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 He subsequently had an injury where he sustained Theodore [name] laceration this was on November 24, 2021. He then ended up with Theodore [name] need for reopening this with incision and drainage on 11-29-2021. I did look through the pictures that his family member had as well from the beginning through now and there has been some improvement though Theodore [name] lot of the granulation tissue is somewhat hypergranular there is also some evidence that the bone is drying out of the central portion of the wound. Unfortunately I do not think that this is healing nearly as well as I would like to see. I believe his blood flow is affecting this significantly and I think that needs to be addressed ASAP. Otherwise the patient's past medical history really has not changed significantly since the last time I saw him. That can be referenced above. Patient History Information obtained from Patient, Caregiver. Allergies penicillin (Severity: Moderate, Reaction: dizzy SOB), Lexapro (Severity: Mild) Social History Never smoker, Marital Status - Married, Alcohol Use - Never, Drug Use - No History, Caffeine Use - Rarely. Medical History Respiratory Patient has history of Chronic Obstructive Pulmonary Disease (COPD) Cardiovascular Patient has history of Peripheral Arterial Disease Endocrine Denies history of Type I Diabetes, Type II Diabetes Neurologic Denies history of Dementia Review of Systems (ROS) Constitutional Symptoms (General Health) Denies complaints or symptoms of Fatigue, Fever, Chills, Marked Weight Change. Integumentary (Skin) Complains or has symptoms of Wounds. Objective Constitutional sitting or  standing blood pressure is within target range for patient.. pulse regular and within target range for patient.[name] respirations regular, non-labored and within target range for patient.[name] temperature within target range for patient.. Well-nourished and well-hydrated in no acute distress. Vitals Time Taken: 8:50 AM, Height: 75 in, Source: Stated, Weight: 168 lbs, Source: Stated, BMI: 21, Temperature: 97.6 F, Pulse: 76 bpm, Respiratory Rate: 16 breaths/min, Blood Pressure: 146/79 mmHg. Theodore Theodore [name], Theodore Theodore [name] ([id number]) [id number] Page 6 of 8 Eyes conjunctiva clear no eyelid edema noted. pupils equal round and reactive to light and accommodation. Ears, Nose, Mouth, and Throat no gross abnormality of ear auricles or external auditory canals. normal hearing noted during conversation. mucus membranes moist. Respiratory normal breathing without difficulty. Cardiovascular Absent posterior tibial and dorsalis pedis pulses bilateral lower extremities. no clubbing, cyanosis, significant edema, Musculoskeletal normal gait and posture. no significant deformity or arthritic changes, no loss or range of motion, no clubbing. Psychiatric this patient is able to make decisions and demonstrates good insight into disease process. Alert and Oriented x 3. pleasant and cooperative. General Notes: Upon inspection patient's wound bed showed signs of some dry mist to the bone noted which is starting to show signs of breaking down in my opinion. With that being said I think that his poor arterial flow is significantly affecting his chances of being able to heal here and I do believe that  he needs to have this evaluated and improved as soon as possible to try to give him the best chance to get this to heal. This is obviously Theodore [name] definitive limb threatening situation if we are not proactive in this currently. He voiced understanding and notes that he has an appointment on the 15th with vein and vascular.  With that being said we attempted to call and get this moved to they were unable to movement as next week they are actually moving their office to Theodore [name] completely new location and did not have anything available to be able to see patients in the interim. I completely understand this is unavoidable. Integumentary (Hair, Skin) Wound #2 status is Open. Original cause of wound was Laceration. The date acquired was: 11/24/2021. The wound is located on the Right,Midline,Anterior Lower Leg. The wound measures 5.2cm length x 2.4cm width x 0.7cm depth; 9.802cm^2 area and 6.861cm^3 volume. There is bone and Fat Layer (Subcutaneous Tissue) exposed. There is undermining starting at 12:00 and ending at 12:00 with Theodore [name] maximum distance of 0.1cm. There is Theodore [name] medium amount of serosanguineous drainage noted. There is small (1-33%) red, friable granulation within the wound bed. There is no necrotic tissue within the wound bed. 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 Bathing/ Shower/ Hygiene: Wash wounds with antibacterial soap and water. - Wash wound with soap and water. Pat dry with gauze then apply Xeroform gauze over wound bed followed by Theodore [name] moistened Dakin's soaked gauze. Moisten with Dakin's only. DO NOT SATURATE. Cover with ABD pad and secure with Kerlix. DO NOT WRAP WITH COBAN. Secure Kerlix with netting or if not available Medipore tape. WOUND #2: - Lower Leg Wound Laterality: Right, Midline, Anterior Cleanser: Soap and Water 1 x Per Day/30 Days Discharge Instructions: Gently cleanse wound with antibacterial soap, rinse and pat dry prior to dressing wounds Prim Dressing: Gauze 1 x Per Day/30 Days ary Discharge Instructions: As directed: dry,  moistened with saline or moistened with Dakins Solution Prim Dressing: Xeroform 4x4-HBD (in/in) 1 x Per Day/30 Days ary Discharge Instructions: Apply Xeroform 4x4-HBD (in/in) as directed Secondary Dressing: ABD Pad 5x9 (in/in) 1 x Per Day/30 Days Discharge Instructions: Cover with ABD pad Secured With: Conform 4'' - Conforming Stretch Gauze Bandage 4x75 (in/in) 1 x Per Day/30 Days Discharge Instructions: Apply as directed Secured With: Kerlix Roll Sterile or Non-Sterile 6-ply 4.5x4 (yd/yd) 1 x Per Day/30 Days Discharge Instructions: Apply Kerlix as directed 1. Based on what I see currently I do believe that the patient would benefit from arterial studies with ABI TBI as soon as possible and he actually has an appointment to see this and the provider on 15 November this is the earliest they can get him in due to moving their office to completely new location. 2. Based on what I am seeing I would actually initiate treatment with Xeroform gauze to be applied to the wound bed and then following this regularly using the Dakin's moistened gauze packed in behind then an ABD pad and roll gauze to secure in place. Stretching that should then be used to hold this in place. 3. Right now I do not see any signs of infection but that is definitely something that we need to keep Theodore [name] close eye on. We will see the patient  back in 1 week's time to see where we stand and depending on how things are. We will make any adjustments as necessary. We will see patient back for reevaluation in 1 week here in the clinic. If anything worsens or changes patient will contact our office for additional recommendations. Electronic Signature(s) Signed: 01/13/2022 1:42:26 PM By: Elsie Amis, Jessie Theodore [name] ([id number]) [id number] Page 7 of 8 Entered By: Worthy Keeler on 01/13/2022 13:42:26 -------------------------------------------------------------------------------- ROS/PFSH Details Patient  Name: Date of Service: Theodore Theodore [name], Theodore Theodore [name] 01/13/2022 8:30 Theodore [name] Medical Record Number: [id number] Patient Account Number: [id number] Date of Birth/Sex: Treating RN: Aug 14, 1929 (88 y.o. Seward Meth Primary Care Provider: [name] Other Clinician: Referring Provider: Treating Provider/Extender: [name] Weeks in Treatment: 0 Information Obtained From Patient Caregiver Constitutional Symptoms (General Health) Complaints and Symptoms: Negative for: Fatigue; Fever; Chills; Marked Weight Change Integumentary (Skin) Complaints and Symptoms: Positive for: Wounds Respiratory Medical History: Positive for: Chronic Obstructive Pulmonary Disease (COPD) Cardiovascular Medical History: Positive for: Peripheral Arterial Disease Endocrine Medical History: Negative for: Type I Diabetes; Type II Diabetes Neurologic Medical History: Negative for: Dementia Immunizations Pneumococcal Vaccine: Received Pneumococcal Vaccination: No Implantable Devices None Family and Social History Never smoker; Marital Status - Married; Alcohol Use: Never; Drug Use: No History; Caffeine Use: Rarely Electronic Signature(s) Signed: 01/13/2022 1:54:42 PM By: Worthy Keeler PA-C Signed: 01/16/2022 2:21:51 PM By: Rosalio Loud MSN RN CNS WTA Entered By: Rosalio Loud on 01/13/2022 10:56:45 Bartles, Ida Theodore [name] ([id number]) [id number] Page 8 of 8 -------------------------------------------------------------------------------- SuperBill Details Patient Name: Date of Service: Theodore Theodore [name], Theodore Theodore [name] 01/13/2022 Medical Record Number: [id number] Patient Account Number: [id number] Date of Birth/Sex: Treating RN: 12-Aug-1929 (88 y.o. 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 Facility Procedures : CPT4 Code: 72536644 Description: [place] VISIT-LEV 3 EST PT Modifier: Quantity: 1 Physician Procedures : CPT4 Code Description Modifier [id number] - WC PHYS LEVEL 4 - EST PT ICD-10 Diagnosis Description S80.811A Abrasion, right lower leg, initial encounter L97.812 Non-pressure chronic ulcer of other part of right lower leg with fat layer exposed  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

## 2022-01-16 NOTE — Progress Notes (Signed)
Theodore [name], Theodore [name] ([id number]) [id number] Page 1 of 10 Visit Report for 01/13/2022 Allergy List Details Patient Name: Date of Service: Theodore [name], Theodore [name] 01/13/2022 8:30 [name] Medical Record Number: [id number] Patient Account Number: [id number] Date of Birth/Sex: Treating RN: Sep 21, 1929 (88 y.o. Seward Meth Primary Care Taylee Gunnells: [name] Other Clinician: Referring Kain Milosevic: Treating Amrutha Avera/Extender: [name] Weeks in Treatment: 0 Allergies Active Allergies penicillin Reaction: dizzy SOB Severity: Moderate Lexapro Severity: Mild Allergy Notes Electronic Signature(s) Signed: 01/13/2022 10:54:58 AM By: Rosalio Loud MSN RN CNS WTA Entered By: Rosalio Loud on 01/13/2022 10:54:58 -------------------------------------------------------------------------------- Arrival Information Details Patient Name: Date of Service: Theodore [name], Theodore [name]. 01/13/2022 8:30 [name] Medical Record Number: [id number] Patient Account Number: [id number] Date of Birth/Sex: Treating RN: Feb 27, 1930 (88 y.o. Seward Meth Primary Care Alferd Obryant: [name] Other Clinician: Referring Joseluis Alessio: Treating Alisan Dokes/Extender: [name] in Treatment: 0 Visit Information Patient Arrived: Theodore [name] Time: 08:43 Accompanied By: Wife Transfer Assistance: None Patient Identification Verified: Yes Secondary Verification Process Completed: Yes Patient Requires Transmission-Based Precautions: No Patient Has Alerts: Yes Patient Alerts: ABI R .61 09/30/21 TBI .38 ABI L .49 09/30/21 TBI .[place], Cai [name] ([id number]) ABI L .49 09/30/21 TBI .47 History Since Last Visit Added or deleted any medications: No Any new allergies or adverse reactions: No Had [name] fall or experienced change in activities of daily living that may affect risk of falls: No Signs or symptoms of abuse/neglect since last visito No Hospitalized since last visit: No Implantable device outside  of the clinic excluding cellular tissue based products placed in the center since last visit: No [id number] Page 2 of 10 Pain Present Now: No Electronic Signature(s) Signed: 01/13/2022 10:53:37 AM By: Rosalio Loud MSN RN CNS WTA Previous Signature: 01/13/2022 10:50:48 AM Version By: Rosalio Loud MSN RN CNS WTA Entered By: Rosalio Loud on 01/13/2022 10:53:37 -------------------------------------------------------------------------------- Clinic Level of Care Assessment Details Patient Name: Date of Service: Theodore [name]. 01/13/2022 8:30 [name] Medical Record Number: [id number] Patient Account Number: [id number] Date of Birth/Sex: Treating RN: May 17, 1929 (88 y.o. Seward Meth Primary Care Onyinyechi Huante: [name] Other Clinician: Referring Maliik Karner: Treating Keylen Uzelac/Extender: [name] in Treatment: 0 Clinic Level of Care Assessment Items TOOL 2 Quantity Score X- 1 0 Use when only an EandM is performed on the INITIAL visit ASSESSMENTS - Nursing Assessment / Reassessment X- 1 20 General Physical Exam (combine w/ comprehensive assessment (listed just below) when performed on new pt. evals) X- 1 25 Comprehensive Assessment (HX, ROS, Risk Assessments, Wounds Hx, etc.) ASSESSMENTS - Wound and Skin [name] X - Simple Wound Assessment / Reassessment - one wound 1 5 '[]'$  - 0 Complex Wound Assessment / Reassessment - multiple wounds '[]'$  - 0 Dermatologic / Skin Assessment (not related to wound area) ASSESSMENTS - Ostomy and/or Continence Assessment and Care '[]'$  - 0 Incontinence Assessment and Management '[]'$  - 0 Ostomy Care Assessment and Management (repouching, etc.) PROCESS - Coordination of Care X - Simple Patient / Family Education for ongoing care 1 15 '[]'$  - 0 Complex (extensive) Patient / Family Education for ongoing care X- 1 10 Staff obtains Programmer, systems, Records, T Results / Process Orders est '[]'$  - 0 Staff telephones  HHA, Nursing Homes / Clarify orders / etc '[]'$  - 0 Routine Transfer to another Facility (non-emergent condition) '[]'$  - 0 Routine Hospital Admission (non-emergent condition) '[]'$  - 0 New Admissions / Biomedical engineer / Ordering NPWT Apligraf, etc. , '[]'$  -  0 Emergency Hospital Admission (emergent condition) X- 1 10 Simple Discharge Coordination '[]'$  - 0 Complex (extensive) Discharge Coordination PROCESS - Special Needs '[]'$  - 0 Pediatric / Minor Patient Management '[]'$  - 0 Isolation Patient Management Theodore [name], Theodore [name] ([id number]) [id number] Page 3 of 10 '[]'$  - 0 Hearing / Language / Visual special needs '[]'$  - 0 Assessment of Community assistance (transportation, D/C planning, etc.) '[]'$  - 0 Additional assistance / Altered mentation '[]'$  - 0 Support Surface(s) Assessment (bed, cushion, seat, etc.) INTERVENTIONS - Wound Cleansing / Measurement X- 1 5 Wound Imaging (photographs - any number of wounds) '[]'$  - 0 Wound Tracing (instead of photographs) X- 1 5 Simple Wound Measurement - one wound '[]'$  - 0 Complex Wound Measurement - multiple wounds X- 1 5 Simple Wound Cleansing - one wound '[]'$  - 0 Complex Wound Cleansing - multiple wounds INTERVENTIONS - Wound Dressings '[]'$  - 0 Small Wound Dressing one or multiple wounds '[]'$  - 0 Medium Wound Dressing one or multiple wounds '[]'$  - 0 Large Wound Dressing one or multiple wounds '[]'$  - 0 Application of Medications - injection INTERVENTIONS - Miscellaneous '[]'$  - 0 External ear exam '[]'$  - 0 Specimen Collection (cultures, biopsies, blood, body fluids, etc.) '[]'$  - 0 Specimen(s) / Culture(s) sent or taken to Lab for analysis '[]'$  - 0 Patient Transfer (multiple staff / Civil Service fast streamer / Similar devices) '[]'$  - 0 Simple Staple / Suture removal (25 or less) '[]'$  - 0 Complex Staple / Suture removal (26 or more) '[]'$  - 0 Hypo / Hyperglycemic Management (close monitor of Blood Glucose) '[]'$  - 0 Ankle / Brachial Index (ABI) - do not check if  billed separately Has the patient been seen at the hospital within the last three years: Yes Total Score: 100 Level Of Care: New/Established - Level 3 Electronic Signature(s) Signed: 01/16/2022 2:21:51 PM By: Rosalio Loud MSN RN CNS WTA Entered By: Rosalio Loud on 01/13/2022 11:05:24 -------------------------------------------------------------------------------- Encounter Discharge Information Details Patient Name: Date of Service: Theodore [name], Theodore [name]. 01/13/2022 8:30 [name] Medical Record Number: [id number] Patient Account Number: [id number] Date of Birth/Sex: Treating RN: 1929-07-14 (88 y.o. Seward Meth Primary Care Daryan Buell: [name] Other Clinician: Referring Emmaleah Meroney: Treating Maveryck Bahri/Extender: [name] Weeks in Treatment: 0 Encounter Discharge Information Items Discharge Condition: Stable DINARI, STGERMAINE [name] ([id number]) [id number] Page 4 of 10 Ambulatory Status: Walker Discharge Destination: Home Transportation: Private Auto Accompanied By: wife Schedule Follow-up Appointment: Yes Clinical Summary of Care: Electronic Signature(s) Signed: 01/13/2022 11:07:12 AM By: Rosalio Loud MSN RN CNS WTA Entered By: Rosalio Loud on 01/13/2022 11:07:12 -------------------------------------------------------------------------------- Lower Extremity Assessment Details Patient Name: Date of Service: Theodore [name], Theodore [name] 01/13/2022 8:30 [name] Medical Record Number: [id number] Patient Account Number: [id number] Date of Birth/Sex: Treating RN: 11-20-1929 (88 y.o. Seward Meth Primary Care Jahne Krukowski: [name] Other Clinician: Referring Windle Huebert: Treating Crescent Gotham/Extender: [name] Weeks in Treatment: 0 Edema Assessment Assessed: Shirlyn Goltz: No] [Right: No] Edema: [Left: N] [Right: o] Calf Left: Right: Point of Measurement: 40 cm From Medial Instep 34 cm Ankle Left: Right: Point of Measurement: 12 cm From Medial Instep 21  cm Vascular Assessment Pulses: Dorsalis Pedis Palpable: [Right:No] Popliteal Doppler Audible: [Right:Yes] Electronic Signature(s) Signed: 01/13/2022 10:54:51 AM By: Rosalio Loud MSN RN CNS WTA Entered By: Rosalio Loud on 01/13/2022 10:54:51 Zavalza, Graham [name] ([id number]) [id number] Page 5 of 10 -------------------------------------------------------------------------------- Multi Wound Chart Details Patient Name: Date of Service: Theodore [name], Theodore [name] 01/13/2022 8:30 [name] Medical Record Number: [id number] Patient Account Number:  [id number] Date of Birth/Sex: Treating RN: 10/05/1929 (88 y.o. Seward Meth Primary Care Neesa Knapik: [name] Other Clinician: Referring Adrianna Dudas: Treating Diana Armijo/Extender: [name] Weeks in Treatment: 0 Vital Signs Height(in): 75 Pulse(bpm): 60 Weight(lbs): 168 Blood Pressure(mmHg): 146/79 Body Mass Index(BMI): 21 Temperature(F): 97.6 Respiratory Rate(breaths/min): 16 [2:Photos:] [N/[name]:N/[name]] Right, Midline, Anterior Lower Leg N/[name] N/[name] Wound Location: Laceration N/[name] N/[name] Wounding Event: Atypical N/[name] N/[name] Primary Etiology: Trauma, Other N/[name] N/[name] Secondary Etiology: Chronic Obstructive Pulmonary N/[name] N/[name] Comorbid History: Disease (COPD), Peripheral Arterial Disease 11/24/2021 N/[name] N/[name] Date Acquired: 0 N/[name] N/[name] Weeks of Treatment: Open N/[name] N/[name] Wound Status: No N/[name] N/[name] Wound Recurrence: 5.2x2.4x0.7 N/[name] N/[name] Measurements L x W x D (cm) 9.802 N/[name] N/[name] [name] (cm) : rea 6.861 N/[name] N/[name] Volume (cm) : 12 Starting Position 1 (o'clock): 12 Ending Position 1 (o'clock): 0.1 Maximum Distance 1 (cm): Yes N/[name] N/[name] Undermining: Full Thickness With Exposed Support N/[name] N/[name] Classification: Structures Medium N/[name] N/[name] Exudate Amount: Serosanguineous N/[name] N/[name] Exudate Type: red, brown N/[name] N/[name] Exudate Color: Small (1-33%) N/[name] N/[name] Granulation Amount: Red, Friable N/[name] N/[name] Granulation Quality: None Present (0%) N/[name]  N/[name] Necrotic Amount: Fat Layer (Subcutaneous Tissue): Yes N/[name] N/[name] Exposed Structures: Bone: Yes Fascia: No Tendon: No Muscle: No Joint: No Small (1-33%) N/[name] N/[name] Epithelialization: Treatment Notes Wound #2 (Lower Leg) Wound Laterality: Right, Midline, Anterior Cleanser Soap and Water Discharge Instruction: Gently cleanse wound with antibacterial soap, rinse and pat dry prior to dressing wounds Peri-Wound Care Topical Primary Dressing Xeroform 4x4-HBD (in/in) Discharge Instruction: Apply Xeroform 4x4-HBD (in/in) as directed Secondary Dressing Caccamo, Adarrius [name] ([id number]) [id number] Page 6 of 10 ABD Pad 5x9 (in/in) Discharge Instruction: Cover with ABD pad Secured With Conform 4'' - Conforming Stretch Gauze Bandage 4x75 (in/in) Discharge Instruction: Apply as directed Kerlix Roll Sterile or Non-Sterile 6-ply 4.5x4 (yd/yd) Discharge Instruction: Apply Kerlix as directed Compression Wrap Compression Stockings Add-Ons Electronic Signature(s) Signed: 01/13/2022 11:06:26 AM By: Rosalio Loud MSN RN CNS WTA Previous Signature: 01/13/2022 10:59:51 AM Version By: Rosalio Loud MSN RN CNS WTA Entered By: Rosalio Loud on 01/13/2022 11:06:26 -------------------------------------------------------------------------------- Multi-Disciplinary Care Plan Details Patient Name: Date of Service: Theodore Noss [name]. 01/13/2022 8:30 [name] Medical Record Number: [id number] Patient Account Number: [id number] Date of Birth/Sex: Treating RN: 1929/10/24 (88 y.o. Seward Meth Primary Care Shanekqua Schaper: [name] Other Clinician: Referring Juelz Whittenberg: Treating Malikai Gut/Extender: [name] Weeks in Treatment: 0 Active Inactive Wound/Skin Impairment Nursing Diagnoses: Impaired tissue integrity Knowledge deficit related to smoking impact on wound healing Knowledge deficit related to ulceration/compromised skin integrity Goals: Patient will demonstrate [name] reduced  rate of smoking or cessation of smoking Date Initiated: 01/13/2022 Target Resolution Date: 02/12/2022 Goal Status: Active Patient will have [name] decrease in wound volume by X% from date: (specify in notes) Date Initiated: 01/13/2022 Target Resolution Date: 02/12/2022 Goal Status: Active Patient/caregiver will verbalize understanding of skin care regimen Date Initiated: 01/13/2022 Target Resolution Date: 02/12/2022 Goal Status: Active Ulcer/skin breakdown will have [name] volume reduction of 30% by week 4 Date Initiated: 01/13/2022 Target Resolution Date: 02/12/2022 Goal Status: Active Ulcer/skin breakdown will have [name] volume reduction of 50% by week 8 Date Initiated: 01/13/2022 Target Resolution Date: 03/15/2022 Goal Status: Active Ulcer/skin breakdown will have [name] volume reduction of 80% by week 12 Date Initiated: 01/13/2022 Target Resolution Date: 04/15/2022 Goal Status: Active Ulcer/skin breakdown will heal within 14 weeks Theodore [name], Theodore [name] ([id number]) [id number] Page 7 of 10 Date Initiated: 01/13/2022 Target Resolution Date: 05/14/2022 Goal Status: Active Interventions: Assess  patient/caregiver ability to obtain necessary supplies Assess patient/caregiver ability to perform ulcer/skin care regimen upon admission and as needed Assess ulceration(s) every visit Provide education on ulcer and skin care Screen for HBO Treatment Activities: Skin care regimen initiated : 01/13/2022 Topical wound management initiated : 01/13/2022 Notes: Electronic Signature(s) Signed: 01/13/2022 10:58:41 AM By: Rosalio Loud MSN RN CNS WTA Entered By: Rosalio Loud on 01/13/2022 10:58:41 -------------------------------------------------------------------------------- Pain Assessment Details Patient Name: Date of Service: Theodore [name], Theodore [name]. 01/13/2022 8:30 [name] Medical Record Number: [id number] Patient Account Number: [id number] Date of Birth/Sex: Treating RN: Nov 12, 1929 (88 y.o. Seward Meth Primary  Care Shia Delaine: [name] Other Clinician: Referring Trevell Pariseau: Treating Maxine Huynh/Extender: [name] Weeks in Treatment: 0 Active Problems Location of Pain Severity and Description of Pain Patient Has Paino No Site Locations Pain Management and Medication Current Pain Management: Electronic Signature(s) Signed: 01/13/2022 10:53:48 AM By: Rosalio Loud MSN RN CNS WTA Previous Signature: 01/13/2022 10:51:07 AM Version By: Rosalio Loud MSN RN CNS WTA Entered By: Rosalio Loud on 01/13/2022 10:53:48 Theodore [name], Theodore [name] ([id number]) [id number] Page 8 of 10 -------------------------------------------------------------------------------- Patient/Caregiver Education Details Patient Name: Date of Service: Theodore [name], Theodore [name] 11/3/2023andnbsp8:30 [name] Medical Record Number: [id number] Patient Account Number: [id number] Date of Birth/Gender: Treating RN: 1929-06-24 (88 y.o. Seward Meth Primary Care Physician: [name] Other Clinician: Referring Physician: Treating Physician/Extender: [name] in Treatment: 0 Education Assessment Education Provided To: Patient Education Topics Provided Cameron: o Handouts: Welcome T [facility] [place] o Methods: Explain/Verbal Responses: State content correctly Wound/Skin Impairment: Handouts: Caring for Your Ulcer Methods: Explain/Verbal Responses: State content correctly Electronic Signature(s) Signed: 01/16/2022 2:21:51 PM By: Rosalio Loud MSN RN CNS WTA Entered By: Rosalio Loud on 01/13/2022 11:05:49 -------------------------------------------------------------------------------- Wound Assessment Details Patient Name: Date of Service: Theodore [name], Theodore [name]. 01/13/2022 8:30 [name] Medical Record Number: [id number] Patient Account Number: [id number] Date of Birth/Sex: Treating RN: 1930/02/22 (88 y.o. Seward Meth Primary Care Amon Costilla: [name] Other  Clinician: Referring Tykeem Lanzer: Treating Angeldejesus Callaham/Extender: [name] Weeks in Treatment: 0 Wound Status Wound Number: 2 Primary Atypical Etiology: Wound Location: Right, Midline, Anterior Lower Leg Secondary Trauma, Other Wounding Event: Laceration Etiology: Date Acquired: 11/24/2021 Wound Status: Open Weeks Of Treatment: 0 Comorbid Chronic Obstructive Pulmonary Disease (COPD), Peripheral Clustered Wound: No Paola, Nikolas [name] ([id number]) [id number] Page 9 of 10 Clustered Wound: No History: Arterial Disease Photos Wound Measurements Length: (cm) 5.2 Width: (cm) 2.4 Depth: (cm) 0.7 Area: (cm) 9.802 Volume: (cm) 6.861 % Reduction in Area: % Reduction in Volume: Epithelialization: Small (1-33%) Undermining: Yes Starting Position (o'clock): 12 Ending Position (o'clock): 12 Maximum Distance: (cm) 0.1 Wound Description Classification: Full Thickness With Exposed Support Structures Exudate Amount: Medium Exudate Type: Serosanguineous Exudate Color: red, brown Foul Odor After Cleansing: No Slough/Fibrino Yes Wound Bed Granulation Amount: Small (1-33%) Exposed Structure Granulation Quality: Red, Friable Fascia Exposed: No Necrotic Amount: None Present (0%) Fat Layer (Subcutaneous Tissue) Exposed: Yes Tendon Exposed: No Muscle Exposed: No Joint Exposed: No Bone Exposed: Yes Treatment Notes Wound #2 (Lower Leg) Wound Laterality: Right, Midline, Anterior Cleanser Soap and Water Discharge Instruction: Gently cleanse wound with antibacterial soap, rinse and pat dry prior to dressing wounds Peri-Wound Care Topical Primary Dressing Gauze Discharge Instruction: As directed: dry, moistened with saline or moistened with Dakins Solution Xeroform 4x4-HBD (in/in) Discharge Instruction: Apply Xeroform 4x4-HBD (in/in) as directed Secondary Dressing ABD Pad 5x9 (in/in) Discharge Instruction: Cover with ABD pad Secured  With Conform 4''  - Conforming Stretch Gauze Bandage 4x75 (in/in) Discharge Instruction: Apply as directed Kerlix Roll Sterile or Non-Sterile 6-ply 4.5x4 (yd/yd) Discharge Instruction: Apply Kerlix as directed Compression Wrap Compression Stockings Add-Ons BENEDETTO, RYDER [name] ([id number]) [id number] Page 10 of 10 Electronic Signature(s) Signed: 01/13/2022 10:54:29 AM By: Rosalio Loud MSN RN CNS WTA Entered By: Rosalio Loud on 01/13/2022 10:54:28 -------------------------------------------------------------------------------- Vitals Details Patient Name: Date of Service: Cade, Theodore [name]. 01/13/2022 8:30 [name] Medical Record Number: [id number] Patient Account Number: [id number] Date of Birth/Sex: Treating RN: [birth year] (88 y.o. Seward Meth Primary Care Rayson Rando: [name] Other Clinician: Referring Preciosa Bundrick: Treating Amiel Sharrow/Extender: [name] Weeks in Treatment: 0 Vital Signs Time Taken: 08:50 Temperature (F): 97.6 Height (in): 75 Pulse (bpm): 76 Source: Stated Respiratory Rate (breaths/min): 16 Weight (lbs): 168 Blood Pressure (mmHg): 146/79 Source: Stated Reference Range: 80 - 120 mg / dl Body Mass Index (BMI): 21 Electronic Signature(s) Signed: 01/13/2022 10:53:52 AM By: Rosalio Loud MSN RN CNS WTA Previous Signature: 01/13/2022 10:51:28 AM Version By: Rosalio Loud MSN RN CNS WTA Entered By: Rosalio Loud on 01/13/2022 10:53:52

## 2022-01-17 ENCOUNTER — Encounter: Payer: PPO | Admitting: Speech Pathology

## 2022-01-18 ENCOUNTER — Encounter: Payer: Self-pay | Admitting: Urology

## 2022-01-18 ENCOUNTER — Ambulatory Visit: Payer: PPO | Admitting: Urology

## 2022-01-18 VITALS — BP 163/74 | HR 77 | Ht 74.0 in | Wt 170.0 lb

## 2022-01-18 DIAGNOSIS — R972 Elevated prostate specific antigen [PSA]: Secondary | ICD-10-CM | POA: Diagnosis not present

## 2022-01-18 NOTE — Progress Notes (Signed)
   01/18/2022 3:34 PM   Theodore [name] 12-Nov-1929 [id number]  Reason for visit: Follow up elevated PSA, low testosterone  HPI: 88 year old male who I originally saw in September 2020 for an elevated PSA of 9.77 which had increased from 3.65 in 2016 and 2.1 in 2008.  I recommended follow-up with repeat PSA in 2 to 3 months, but he did not follow-up.  He has been on testosterone long-term over 10 years via PCP.  His PCP repeated PSA which had jumped up to 16.2 in July 2022.  PSA was repeated in August and remained elevated at 14.8.  At that point we stopped his testosterone for 1 month repeat PSA on 11/22/2020 remained elevated at 14.5.  DRE at our prior visit was benign.  Using shared decision making at our last visit he deferred biopsy and opted for a repeat PSA in 6 months  PSA continued to rise, including 21.7 in December 2022, and 20.1 on 05/17/2021.  PSA doubling time is 2.7 years  We had a long conversation at our last visit about likelihood of prostate cancer, but with his age and comorbidities, he opted to defer biopsy or further imaging at this time.  He preferred more of a watchful waiting type approach.  He has had some medical problems recently including a significant shin wound that required debridement and wound care, and he does look more frail than our last visit.  Fortunately, PSA has stabilized, most recently 17.5 which is down from 20 in March 2023.  We discussed this PSA elevation likely still represents prostate cancer, but with his age and comorbidities watchful waiting continues to be a reasonable option.  Using shared decision making he again opts to defer biopsy or further imaging and pursue watchful waiting.  Return precautions discussed at length, recommended follow-up in 9 months with repeat PSA, if significant increase in the PSA recommend cross-sectional imaging with CT abdomen and pelvis with contrast  RTC 6 months PSA prior-patient prefers more of a watchful waiting  approach for suspected prostate cancer   Billey Co, MD  [facility] [address] [place], [place] [address] [phone]

## 2022-01-19 ENCOUNTER — Encounter: Payer: PPO | Admitting: Speech Pathology

## 2022-01-19 ENCOUNTER — Ambulatory Visit (INDEPENDENT_AMBULATORY_CARE_PROVIDER_SITE_OTHER): Payer: PPO | Admitting: Physician Assistant

## 2022-01-19 ENCOUNTER — Encounter: Payer: Self-pay | Admitting: Physician Assistant

## 2022-01-19 VITALS — BP 121/69 | HR 83 | Temp 98.0°F | Ht 74.0 in | Wt 173.0 lb

## 2022-01-19 DIAGNOSIS — A4901 Methicillin susceptible Staphylococcus aureus infection, unspecified site: Secondary | ICD-10-CM

## 2022-01-19 DIAGNOSIS — T8149XD Infection following a procedure, other surgical site, subsequent encounter: Secondary | ICD-10-CM

## 2022-01-19 DIAGNOSIS — Z09 Encounter for follow-up examination after completed treatment for conditions other than malignant neoplasm: Secondary | ICD-10-CM | POA: Diagnosis not present

## 2022-01-19 DIAGNOSIS — S81811D Laceration without foreign body, right lower leg, subsequent encounter: Secondary | ICD-10-CM | POA: Diagnosis not present

## 2022-01-19 NOTE — Progress Notes (Signed)
[facility] POST-OP OFFICE VISIT  01/19/2022  HPI: Theodore [name] is a 88 y.o. male ~6 weeks s/p Incision and Drainage of right lower extremity wound, debridement of skin and subcutaneous tissue of right lower extremity with Dr Kirke Corin.   He is doing well No longer with pain Seen wound care center on 11/03:  - Xeroform gauze, secure, wrap with Kerlix gauze Wound care follow up on 101/10 Plan to see vascular surgery as well; 11/15  Vital signs: BP 121/69   Pulse 83   Temp 98 F (36.7 C)   Ht '6\' 2"'$  (1.88 m)   Wt 173 lb (78.5 kg)   SpO2 98%   BMI 22.21 kg/m    Physical Exam: Constitutional: Well appearing male, NAD Skin: 6 x 2 x 0.5 cm wound to the anterior right shin,  there is a small 2x2 cm area centrally with bone palpable, the remaining wound bed appears to be granulating appropriately. Previous seen surrounding edema and erythema is now resolved. This is not tender.     Assessment/Plan: This is a 88 y.o. male ~6 weeks s/p Incision and Drainage of right lower extremity wound, debridement of skin and subcutaneous tissue of right lower extremity with Dr Kirke Corin.    - Pain control prn  - Reviewed wound care recommendation; continue recommendations by wound care center. Appreciate their assistance.   - At this point, he seems to be getting adequate wound care with the wound care center. I will follow up as needed and be happy to assist however is needed. He, and his wife, understand to call with questions/concerns  -- Edison Simon, PA-C [facility] 01/19/2022, 2:06 PM M-F: 7am - 4pm

## 2022-01-19 NOTE — Patient Instructions (Signed)
Follow-up with our office as needed.  Please call and ask to speak with a nurse if you develop questions or concerns.   Follow up with the wound center for the leg wound.  Keep your appointment with vascular.

## 2022-01-20 ENCOUNTER — Encounter: Payer: PPO | Admitting: Internal Medicine

## 2022-01-20 ENCOUNTER — Other Ambulatory Visit: Payer: PPO

## 2022-01-20 DIAGNOSIS — S81811A Laceration without foreign body, right lower leg, initial encounter: Secondary | ICD-10-CM | POA: Diagnosis not present

## 2022-01-20 DIAGNOSIS — L97812 Non-pressure chronic ulcer of other part of right lower leg with fat layer exposed: Secondary | ICD-10-CM | POA: Diagnosis not present

## 2022-01-20 NOTE — Progress Notes (Signed)
Theodore, [name] A ([id number]) [id number] Page 1 of 9 Visit Report for 01/20/2022 Arrival Information Details Patient Name: Date of Service: Theodore [name], Theodore [name] 01/20/2022 9:15 A M Medical Record Number: [id number] Patient Account Number: [id number] Date of Birth/Sex: Treating RN: November 14, 1929 (88 y.o. Seward Meth Primary Care Nalah Macioce: [name] Other Clinician: Referring Niamya Vittitow: Treating Johndaniel Catlin/Extender: Theodore BSO N, MICHA EL Rogelia Rohrer in Treatment: 1 Visit Information History Since Last Visit Added or deleted any medications: No Patient Arrived: Gilford Rile Any new allergies or adverse reactions: No Arrival Time: 09:24 Hospitalized since last visit: No Accompanied By: wife Pain Present Now: No Transfer Assistance: None Patient Requires Transmission-Based Precautions: No Patient Has Alerts: Yes Patient Alerts: ABI R .61 09/30/21 TBI .38 ABI L .49 09/30/21 TBI .47 Electronic Signature(s) Signed: 01/20/2022 12:44:55 PM By: Rosalio Loud MSN RN CNS WTA Entered By: Rosalio Loud on 01/20/2022 09:43:55 -------------------------------------------------------------------------------- Clinic Level of Care Assessment Details Patient Name: Date of Service: Theodore [name], Theodore [name] 01/20/2022 9:15 A M Medical Record Number: [id number] Patient Account Number: [id number] Date of Birth/Sex: Treating RN: Apr 08, 1929 (88 y.o. Seward Meth Primary Care Carlos Heber: [name] Other Clinician: Referring Ladon Vandenberghe: Treating Zacharia Sowles/Extender: Theodore BSO N, MICHA EL Rogelia Rohrer in Treatment: 1 Clinic Level of Care Assessment Items TOOL 4 Quantity Score X- 1 0 Use when only an EandM is performed on FOLLOW-UP visit ASSESSMENTS - Nursing Assessment / Reassessment X- 1 10 Reassessment of Co-morbidities (includes updates in patient status) X- 1 5 Reassessment of Adherence to Treatment Plan ASSESSMENTS - Wound and Skin A ssessment / Reassessment X - Simple Wound  Assessment / Reassessment - one wound 1 5 '[]'$  - 0 Complex Wound Assessment / Reassessment - multiple wounds [name], Theodore A ([id number]) [id number] Page 2 of 9 '[]'$  - 0 Dermatologic / Skin Assessment (not related to wound area) ASSESSMENTS - Focused Assessment '[]'$  - 0 Circumferential Edema Measurements - multi extremities '[]'$  - 0 Nutritional Assessment / Counseling / Intervention '[]'$  - 0 Lower Extremity Assessment (monofilament, tuning fork, pulses) '[]'$  - 0 Peripheral Arterial Disease Assessment (using hand held doppler) ASSESSMENTS - Ostomy and/or Continence Assessment and Care '[]'$  - 0 Incontinence Assessment and Management '[]'$  - 0 Ostomy Care Assessment and Management (repouching, etc.) PROCESS - Coordination of Care X - Simple Patient / Family Education for ongoing care 1 15 '[]'$  - 0 Complex (extensive) Patient / Family Education for ongoing care X- 1 10 Staff obtains Programmer, systems, Records, T Results / Process Orders est '[]'$  - 0 Staff telephones HHA, Nursing Homes / Clarify orders / etc '[]'$  - 0 Routine Transfer to another Facility (non-emergent condition) '[]'$  - 0 Routine Hospital Admission (non-emergent condition) '[]'$  - 0 New Admissions / Biomedical engineer / Ordering NPWT Apligraf, etc. , '[]'$  - 0 Emergency Hospital Admission (emergent condition) X- 1 10 Simple Discharge Coordination '[]'$  - 0 Complex (extensive) Discharge Coordination PROCESS - Special Needs '[]'$  - 0 Pediatric / Minor Patient Management '[]'$  - 0 Isolation Patient Management '[]'$  - 0 Hearing / Language / Visual special needs '[]'$  - 0 Assessment of Community assistance (transportation, D/C planning, etc.) '[]'$  - 0 Additional assistance / Altered mentation '[]'$  - 0 Support Surface(s) Assessment (bed, cushion, seat, etc.) INTERVENTIONS - Wound Cleansing / Measurement X - Simple Wound Cleansing - one wound 1 5 '[]'$  - 0 Complex Wound Cleansing - multiple wounds X- 1 5 Wound Imaging (photographs -  any number of wounds) '[]'$  - 0 Wound Tracing (instead of photographs) X- 1 5  Simple Wound Measurement - one wound '[]'$  - 0 Complex Wound Measurement - multiple wounds INTERVENTIONS - Wound Dressings X - Small Wound Dressing one or multiple wounds 1 10 '[]'$  - 0 Medium Wound Dressing one or multiple wounds '[]'$  - 0 Large Wound Dressing one or multiple wounds '[]'$  - 0 Application of Medications - topical '[]'$  - 0 Application of Medications - injection INTERVENTIONS - Miscellaneous '[]'$  - 0 External ear exam '[]'$  - 0 Specimen Collection (cultures, biopsies, blood, body fluids, etc.) '[]'$  - 0 Specimen(s) / Culture(s) sent or taken to Lab for analysis '[]'$  - 0 Patient Transfer (multiple staff / Stormy Fabian / Similar devices) TRAVELLE, MCCLIMANS A ([id number]) [id number] Page 3 of 9 '[]'$  - 0 Simple Staple / Suture removal (25 or less) '[]'$  - 0 Complex Staple / Suture removal (26 or more) '[]'$  - 0 Hypo / Hyperglycemic Management (close monitor of Blood Glucose) '[]'$  - 0 Ankle / Brachial Index (ABI) - do not check if billed separately X- 1 5 Vital Signs Has the patient been seen at the hospital within the last three years: Yes Total Score: 85 Level Of Care: New/Established - Level 3 Electronic Signature(s) Signed: 01/20/2022 12:44:55 PM By: Rosalio Loud MSN RN CNS WTA Entered By: Rosalio Loud on 01/20/2022 09:50:21 -------------------------------------------------------------------------------- Lower Extremity Assessment Details Patient Name: Date of Service: [name], Theodore Y A. 01/20/2022 9:15 A M Medical Record Number: [id number] Patient Account Number: [id number] Date of Birth/Sex: Treating RN: Dec 05, 1929 (88 y.o. Seward Meth Primary Care Denelle Capurro: [name] Other Clinician: Referring Rickiya Picariello: Treating Tonantzin Mimnaugh/Extender: Theodore BSO N, MICHA EL Rogelia Rohrer in Treatment: 1 Edema Assessment Assessed: [Left: No] [Right: No] [Left: Edema] [Right: :] Calf Left:  Right: Point of Measurement: 40 cm From Medial Instep 32.5 cm Ankle Left: Right: Point of Measurement: 12 cm From Medial Instep 21.5 cm Vascular Assessment Pulses: Dorsalis Pedis Palpable: [Right:Yes] Electronic Signature(s) Signed: 01/20/2022 12:44:55 PM By: Rosalio Loud MSN RN CNS WTA Entered By: Rosalio Loud on 01/20/2022 09:48:44 Lembke, Zakari A ([id number]) [id number] Page 4 of 9 -------------------------------------------------------------------------------- Multi Wound Chart Details Patient Name: Date of Service: Theodore [name], Theodore [name] 01/20/2022 9:15 A M Medical Record Number: [id number] Patient Account Number: [id number] Date of Birth/Sex: Treating RN: 08-29-1929 (88 y.o. Seward Meth Primary Care Parissa Chiao: [name] Other Clinician: Referring Kammy Klett: Treating Ziza Hastings/Extender: Theodore BSO N, MICHA EL Rogelia Rohrer in Treatment: 1 Vital Signs Height(in): 75 Pulse(bpm): 72 Weight(lbs): 168 Blood Pressure(mmHg): 132/65 Body Mass Index(BMI): 21 Temperature(F): 97.2 Respiratory Rate(breaths/min): 16 [2:Photos:] [N/A:N/A] Right, Midline, Anterior Lower Leg N/A N/A Wound Location: Laceration N/A N/A Wounding Event: Atypical N/A N/A Primary Etiology: Trauma, Other N/A N/A Secondary Etiology: Chronic Obstructive Pulmonary N/A N/A Comorbid History: Disease (COPD), Peripheral Arterial Disease 11/24/2021 N/A N/A Date Acquired: 1 N/A N/A Weeks of Treatment: Open N/A N/A Wound Status: No N/A N/A Wound Recurrence: 5.5x2.3x0.4 N/A N/A Measurements L x W x D (cm) 9.935 N/A N/A A (cm) : rea 3.974 N/A N/A Volume (cm) : -1.40% N/A N/A % Reduction in Area: 42.10% N/A N/A % Reduction in Volume: Full Thickness With Exposed Support N/A N/A Classification: Structures Medium N/A N/A Exudate Amount: Serosanguineous N/A N/A Exudate Type: red, brown N/A N/A Exudate Color: Small (1-33%) N/A N/A Granulation Amount: Red, Friable  N/A N/A Granulation Quality: Small (1-33%) N/A N/A Necrotic Amount: Fat Layer (Subcutaneous Tissue): Yes N/A N/A Exposed Structures: Bone: Yes Fascia: No Tendon: No Muscle: No Joint: No Small (1-33%) N/A N/A Epithelialization: Treatment Notes  Electronic Signature(s) Signed: 01/20/2022 12:44:55 PM By: Rosalio Loud MSN RN CNS WTA Entered By: Rosalio Loud on 01/20/2022 09:49:00 Theodore [name], [name] ([id number]) [id number] Page 5 of 9 -------------------------------------------------------------------------------- Multi-Disciplinary Care Plan Details Patient Name: Date of Service: Theodore [name], Theodore [name] 01/20/2022 9:15 A M Medical Record Number: [id number] Patient Account Number: [id number] Date of Birth/Sex: Treating RN: Jan 20, 1930 (88 y.o. Seward Meth Primary Care Leialoha Hanna: [name] Other Clinician: Referring Mayelin Panos: Treating Keia Rask/Extender: Theodore BSO N, MICHA EL Rogelia Rohrer in Treatment: 1 Active Inactive Necrotic Tissue Nursing Diagnoses: Impaired tissue integrity related to necrotic/devitalized tissue Knowledge deficit related to management of necrotic/devitalized tissue Goals: Necrotic/devitalized tissue will be minimized in the wound bed Date Initiated: 01/20/2022 Target Resolution Date: 02/19/2022 Goal Status: Active Patient/caregiver will verbalize understanding of reason and process for debridement of necrotic tissue Date Initiated: 01/20/2022 Target Resolution Date: 02/19/2022 Goal Status: Active Interventions: Assess patient pain level pre-, during and post procedure and prior to discharge Provide education on necrotic tissue and debridement process Treatment Activities: Apply topical anesthetic as ordered : 01/20/2022 Excisional debridement : 01/20/2022 Notes: Wound/Skin Impairment Nursing Diagnoses: Impaired tissue integrity Knowledge deficit related to smoking impact on wound healing Knowledge deficit related to  ulceration/compromised skin integrity Goals: Patient will demonstrate a reduced rate of smoking or cessation of smoking Date Initiated: 01/13/2022 Target Resolution Date: 02/12/2022 Goal Status: Active Patient will have a decrease in wound volume by X% from date: (specify in notes) Date Initiated: 01/13/2022 Target Resolution Date: 02/12/2022 Goal Status: Active Patient/caregiver will verbalize understanding of skin care regimen Date Initiated: 01/13/2022 Target Resolution Date: 02/12/2022 Goal Status: Active Ulcer/skin breakdown will have a volume reduction of 30% by week 4 Date Initiated: 01/13/2022 Target Resolution Date: 02/12/2022 Goal Status: Active Ulcer/skin breakdown will have a volume reduction of 50% by week 8 Date Initiated: 01/13/2022 Target Resolution Date: 03/15/2022 Goal Status: Active Ulcer/skin breakdown will have a volume reduction of 80% by week 12 Date Initiated: 01/13/2022 Target Resolution Date: 04/15/2022 Goal Status: Active KEIGHAN, AMEZCUA ([id number]) [id number] Page 6 of 9 Ulcer/skin breakdown will heal within 14 weeks Date Initiated: 01/13/2022 Target Resolution Date: 05/14/2022 Goal Status: Active Interventions: Assess patient/caregiver ability to obtain necessary supplies Assess patient/caregiver ability to perform ulcer/skin care regimen upon admission and as needed Assess ulceration(s) every visit Provide education on ulcer and skin care Screen for HBO Treatment Activities: Skin care regimen initiated : 01/13/2022 Topical wound management initiated : 01/13/2022 Notes: Electronic Signature(s) Signed: 01/20/2022 12:44:55 PM By: Rosalio Loud MSN RN CNS WTA Entered By: Rosalio Loud on 01/20/2022 09:48:50 -------------------------------------------------------------------------------- Pain Assessment Details Patient Name: Date of Service: Theodore [name], Theodore Y A. 01/20/2022 9:15 A M Medical Record Number: [id number] Patient Account Number:  [id number] Date of Birth/Sex: Treating RN: Jul 20, 1929 (88 y.o. Seward Meth Primary Care Beyonka Pitney: [name] Other Clinician: Referring Josian Lanese: Treating Keyairra Kolinski/Extender: Theodore BSO N, MICHA EL Rogelia Rohrer in Treatment: 1 Active Problems Location of Pain Severity and Description of Pain Patient Has Paino No Site Locations Pain Management and Medication Current Pain Management: Electronic Signature(s) Signed: 01/20/2022 12:44:55 PM By: Rosalio Loud MSN RN CNS WTA Entered By: Rosalio Loud on 01/20/2022 09:44:05 Shed, Dresean A ([id number]) [id number] Page 7 of 9 -------------------------------------------------------------------------------- Patient/Caregiver Education Details Patient Name: Date of Service: RYKEN, Theodore [name] 11/10/2023andnbsp9:15 A M Medical Record Number: [id number] Patient Account Number: [id number] Date of Birth/Gender: Treating RN: [birth year] (88 y.o. Seward Meth Primary Care Physician: [name] Other Clinician: Referring Physician: Treating Physician/Extender: Carolyn Stare  BSO N, MICHA EL G [name] Weeks in Treatment: 1 Education Assessment Education Provided To: Patient and Caregiver Education Topics Provided Wound/Skin Impairment: Handouts: Caring for Your Ulcer Methods: Explain/Verbal Responses: State content correctly Electronic Signature(s) Signed: 01/20/2022 12:44:55 PM By: Rosalio Loud MSN RN CNS WTA Entered By: Rosalio Loud on 01/20/2022 10:08:28 -------------------------------------------------------------------------------- Wound Assessment Details Patient Name: Date of Service: Theodore [name], Theodore Y A. 01/20/2022 9:15 A M Medical Record Number: [id number] Patient Account Number: [id number] Date of Birth/Sex: Treating RN: Nov 24, 1929 (88 y.o. Seward Meth Primary Care Marilin Kofman: [name] Other Clinician: Referring Etheleen Valtierra: Treating Aizlynn Digilio/Extender: Theodore BSO N, MICHA EL Rogelia Rohrer in  Treatment: 1 Wound Status Wound Number: 2 Primary Atypical Etiology: Wound Location: Right, Midline, Anterior Lower Leg Secondary Trauma, Other Wounding Event: Laceration Etiology: Date Acquired: 11/24/2021 Wound Status: Open Weeks Of Treatment: 1 Comorbid Chronic Obstructive Pulmonary Disease (COPD), Peripheral Clustered Wound: No History: Arterial Disease Photos KWALI, Theodore [name] A ([id number]) [id number] Page 8 of 9 Wound Measurements Length: (cm) 5.5 Width: (cm) 2.3 Depth: (cm) 0.4 Area: (cm) 9.935 Volume: (cm) 3.974 % Reduction in Area: -1.4% % Reduction in Volume: 42.1% Epithelialization: Small (1-33%) Wound Description Classification: Full Thickness With Exposed Support Structures Exudate Amount: Medium Exudate Type: Serosanguineous Exudate Color: red, brown Foul Odor After Cleansing: No Slough/Fibrino Yes Wound Bed Granulation Amount: Small (1-33%) Exposed Structure Granulation Quality: Red, Friable Fascia Exposed: No Necrotic Amount: Small (1-33%) Fat Layer (Subcutaneous Tissue) Exposed: Yes Necrotic Quality: Bone Tendon Exposed: No Muscle Exposed: No Joint Exposed: No Bone Exposed: Yes Electronic Signature(s) Signed: 01/20/2022 12:44:55 PM By: Rosalio Loud MSN RN CNS WTA Entered By: Rosalio Loud on 01/20/2022 09:34:17 -------------------------------------------------------------------------------- Vitals Details Patient Name: Date of Service: Theodore [name], Theodore Y A. 01/20/2022 9:15 A M Medical Record Number: [id number] Patient Account Number: [id number] Date of Birth/Sex: Treating RN: May 08, 1929 (88 y.o. Seward Meth Primary Care Primus Gritton: [name] Other Clinician: Referring Alois Colgan: Treating Theodore [name] Shetley/Extender: Theodore BSO N, MICHA EL Rogelia Rohrer in Treatment: 1 Vital Signs Time Taken: 09:27 Temperature (F): 97.2 Height (in): 75 Pulse (bpm): 72 Weight (lbs): 168 Respiratory Rate (breaths/min): 16 Body Mass Index  (BMI): 21 Blood Pressure (mmHg): 132/65 Reference Range: 80 - 120 mg / dl Electronic Signature(s) Signed: 01/20/2022 12:44:55 PM By: Rosalio Loud MSN RN CNS WTA Entered By: Rosalio Loud on 01/20/2022 09:44:00 Coughlin, Burhan A ([id number]) [id number] Page 9 of 9

## 2022-01-20 NOTE — Progress Notes (Signed)
WEBSTER, PATRONE [name] ([id number]) [id number] Page 1 of 6 Visit Report for 01/20/2022 Chief Complaint Document Details Patient Name: Date of Service: Theodore [name], Theodore [name] 01/20/2022 9:15 [name] Theodore [name]: [id number] Patient Account [name]: [id number] Date of Birth/Sex: Treating RN: 1929-07-25 (88 y.o. Theodore [name] Primary Care Provider: Deborra [name] Other Clinician: Referring Provider: Treating Provider/Extender: Theodore [name] in Treatment: 1 Information Obtained from: Patient Chief Complaint Right LE Ulcer Electronic Signature(s) Signed: 01/20/2022 12:44:55 PM By: Theodore Loud MSN RN CNS WTA Signed: 01/20/2022 12:57:19 PM By: Theodore Ham MD Entered By: Theodore [name] on 01/20/2022 10:08:39 -------------------------------------------------------------------------------- HPI Details Patient Name: Date of Service: Theodore [name], Theodore [name]. 01/20/2022 9:15 [name] Theodore [name]: [id number] Patient Account [name]: [id number] Date of Birth/Sex: Treating RN: 11/06/1929 (88 y.o. Theodore [name] Primary Care Provider: Deborra [name] Other Clinician: Referring Provider: Treating Provider/Extender: Theodore [name] in Treatment: 1 History of Present Illness HPI Description: ADMISSION 10/03/2021 This is [name] 88 year old man who was pulling [name] cart 3 months ago he had some sort of imbalance issue hitting his left anterior lower leg. He is not sure exactly what he hit. He is able to show me photos on his phone that show [name] laceration on the left anterior lower leg in an inverted L-shaped and. He initially did very little to this himself. Saw his primary doctor on 09/21/2021 [place] instructed him to put Vaseline gauze on this which he did for short period of time. He says that things are getting [name] lot smaller and are improving he has not really been treating this with anything. The other major problem the patient has is  peripheral arterial disease. He is followed by vein and vascular and has had [name] series of follow-up arterial studies. Most recently this was done on 09/30/2021 showing on the left and ABI of 0.49 with [name] great toe pressure of 0.47 and monophasic waveforms. This is somewhat worse than test he had done on 03/21/2021 at which time the ABI was 0.68 TBI of 0.51. I think because of his age and the fact he has underlying stage IIIa chronic kidney disease and the fact the wound is healing they have not been scheduling him for anything aggressive but follow-up studies are apparently booked for 67-month Past medical history includes coronary artery disease, lumbar radiculopathy, COPD which is limiting and stage IIIa chronic renal failure. He has PAD as noted. He also tells me that he had [name] surgical procedure for essential tremor which left him with some mild dysarthria and gait and balance problems SBENN, TARVERA ([id number] Page 2 of 6 10-10-2021 upon evaluation today patient appears to be doing decently well in regard to his wound all things considered. He is very slow to heal due to his low ABIs. With that being said it does appear that he is making some progress nonetheless there is little bit of dry skin and tissue around the edges of the wound I think [name] very light debridement to clear this away could be of benefit for him. The patient is in agreement with that plan. For that reason I did actually perform debridement to clear that away today but did so extremely carefully. 10-17-2021 upon evaluation today patient appears to be doing well currently in regard to his wound in fact this appears to be completely healed based on what I am seeing today. I do not see any evidence of active infection  locally or systemically which is great news. No fevers, chills, nausea, vomiting, or diarrhea. Readmission: 01-13-2022 patient presents today for follow-up evaluation although the last  time I saw him was actually August of this year. At that time he actually was doing decently well with regard to the wound on the left leg. Unfortunately however it was noted that he had noted poor ABIs and again I somehow missed documented the right side at the previous admission but his right ABI was 0.61 with [name] TBI of 0.38. Based on what I am seeing today and considering his arterial flow which is not very good I do believe that he likely needs to be seen by vascular soon as possible I think that is contributing to his poor healing in regard to this right leg. I discussed that with him today and the individual who I am assuming to be [name] family member that was present with him as well although I did not inquire in order to confirm. 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 He subsequently had an injury where he sustained [name] laceration this was on November 24, 2021. He then ended up with [name] need for reopening this with incision and drainage on 11-29-2021. I did look through the pictures that his family member had as well from the beginning through now and there has been some improvement though [name] lot of the granulation tissue is somewhat hypergranular there is also some evidence that the bone is drying out of the central portion of the wound. Unfortunately I do not think that this is healing nearly as well as I would like to see. I believe his blood flow is affecting this significantly and I think that needs to be addressed ASAP. Otherwise the patient's past medical history really has not changed significantly since the last time I saw him. That can be referenced above. 11/10; second visit for this wound on the right anterior upper tibia. As I understand things he initially had [name] fall while putting something in his attic. Initially the wound was sutured but became secondarily infected and had to be opened by general surgery culturing MSSA. He was followed by Dr. Steva Ready  of infectious disease and is completed antibiotics [Keflex]. He has completed this. Currently we are using Xeroform with backing Dakin's changing this daily. He has formal arterial studies ordered for vein and vascular next Wednesday apparently in response to [name] ABI of 0.6 Patient is asking about Medihoney which they apparently have in the [facility] clinic. I cannot pick up any claudication by history. Electronic Signature(s) Signed: 01/20/2022 12:57:19 PM By: Theodore Ham MD Entered By: Theodore [name] on 01/20/2022 09:57:41 -------------------------------------------------------------------------------- Physical Exam Details Patient Name: Date of Service: Theodore [name], Theodore [name]. 01/20/2022 9:15 [name] Theodore [name]: [id number] Patient Account [name]: [id number] Date of Birth/Sex: Treating RN: [birth year] (88 y.o. Theodore [name] Primary Care Provider: Deborra [name] Other Clinician: Referring Provider: Treating Provider/Extender: Theodore [name] in Treatment: 1 Constitutional Sitting or standing Blood Pressure is within target range for patient.. Pulse regular and within target range for patient.[name] Respirations regular, non-labored and within target range.. Temperature is normal and within the target range for the patient.[name] appears in no distress. Cardiovascular Pedal pulses not palpable in the right foot however his popliteal pulse is palpable. Notes Wound examination right anterior mid tibia fairly substantial wound. The bottom 50% of this has reasonable granulation tissue however approximately there is an area of  exposed bone. There is no significant surrounding soft tissue infection that I can pick up on. Electronic Signature(s) Signed: 01/20/2022 12:57:19 PM By: Theodore Ham MD Entered By: Theodore [name] on 01/20/2022 09:58:51 Theodore [name], Theodore [name] ([id number]) [id number] Page 3 of  6 -------------------------------------------------------------------------------- Physician Orders Details Patient Name: Date of Service: Theodore [name], Theodore [name] 01/20/2022 9:15 [name] Theodore [name]: [id number] Patient Account [name]: [id number] Date of Birth/Sex: Treating RN: March 20, 1929 (88 y.o. Theodore [name] Primary Care Provider: Deborra [name] Other Clinician: Referring Provider: Treating Provider/Extender: Theodore [name] in Treatment: 1 Verbal / Phone Orders: No Diagnosis Coding Follow-up Appointments Return Appointment in 1 week. Bathing/ [employer] wounds with antibacterial soap and water. - Wash wound with soap and water. Pat dry with gauze then apply Xeroform gauze over wound bed followed by [name] moistened Dakin's soaked gauze. Moisten with Dakin's only. DO NOT SATURATE. Cover with ABD pad and secure with Kerlix. DO NOT WRAP WITH COBAN. Secure Kerlix with netting or if not available Medipore tape. Wound Treatment Wound #2 - Lower Leg Wound Laterality: Right, Midline, Anterior Cleanser: Soap and Water 1 x Per Day/30 Days Discharge Instructions: Gently cleanse wound with antibacterial soap, rinse and pat dry prior to dressing wounds Prim Dressing: Gauze 1 x Per Day/30 Days ary Discharge Instructions: As directed: dry, moistened with saline or moistened with Dakins Solution Prim Dressing: Xeroform 4x4-HBD (in/in) 1 x Per Day/30 Days ary Discharge Instructions: Apply Xeroform 4x4-HBD (in/in) as directed Secondary Dressing: ABD Pad 5x9 (in/in) 1 x Per Day/30 Days Discharge Instructions: Cover with ABD pad Secured With: Conform 4'' - Conforming Stretch Gauze Bandage 4x75 (in/in) 1 x Per Day/30 Days Discharge Instructions: Apply as directed Secured With: Kerlix Roll Sterile or Non-Sterile 6-ply 4.5x4 (yd/yd) 1 x Per Day/30 Days Discharge Instructions: Apply Kerlix as directed Electronic Signature(s) Signed: 01/20/2022 12:44:55 PM By: Theodore Loud  MSN RN CNS WTA Signed: 01/20/2022 12:57:19 PM By: Theodore Ham MD Entered By: Theodore [name] on 01/20/2022 09:49:19 -------------------------------------------------------------------------------- Problem List Details Patient Name: Date of Service: Theodore [name], Theodore [name]. 01/20/2022 9:15 [name] Theodore [name]: [id number] Patient Account [name]: [id number] Theodore [name], Theodore [name] ([id number]) [id number] Page 4 of 6 Date of Birth/Sex: Treating RN: 1929-10-15 (88 y.o. 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 01/20/2022 9:15 [name] Theodore [name]: [id number] Patient Account [name]: [id number] Date of Birth/Sex: Treating RN: [birth year] (88 y.o. Theodore [name] Primary Care Provider: Deborra [name] Other Clinician: Referring Provider: Treating Provider/Extender: Theodore [name] in Treatment: 1 Subjective History of Present Illness (HPI) ADMISSION 10/03/2021 This is [name] 88 year old man who was pulling [name] cart 3 months ago he had some sort of imbalance issue hitting his left anterior  lower leg. He is not sure exactly  what he hit. He is able to show me photos on his phone that show [name] laceration on the left anterior lower leg in an inverted L-shaped and. He initially did very little to this himself. Saw his primary doctor on 09/21/2021 [place] instructed him to put Vaseline gauze on this which he did for short period of time. He says that things are getting [name] lot smaller and are improving he has not really been treating this with anything. The other major problem the patient has is peripheral arterial disease. He is followed by vein and vascular and has had [name] series of follow-up arterial studies. Most recently this was done on 09/30/2021 showing on the left and ABI of 0.49 with [name] great toe pressure of 0.47 and monophasic waveforms. This is somewhat worse than test he had done on 03/21/2021 at which time the ABI was 0.68 TBI of 0.51. I think because of his age and the fact he has underlying stage IIIa chronic kidney disease and the fact the wound is healing they have not been scheduling him for anything aggressive but follow-up studies are apparently booked for 55-month Past medical history includes coronary artery disease, lumbar radiculopathy, COPD which is limiting and stage IIIa chronic renal failure. He has PAD as noted. He also tells me that he had [name] surgical procedure for essential tremor which left him with some mild dysarthria and gait and balance problems 10-10-2021 upon evaluation today patient appears to be doing decently well in regard to his wound all things considered. He is very slow to heal due to his low Theodore [name], Theodore [name]([id number] Page 5 of 6 ABIs. With that being said it does appear that he is making some progress nonetheless there is little bit of dry skin and tissue around the edges of the wound I think [name] very light debridement to clear this away could be of benefit for him. The patient is in agreement with that plan. For that  reason I did actually perform debridement to clear that away today but did so extremely carefully. 10-17-2021 upon evaluation today patient appears to be doing well currently in regard to his wound in fact this appears to be completely healed based on what I am seeing today. I do not see any evidence of active infection locally or systemically which is great news. No fevers, chills, nausea, vomiting, or diarrhea. Readmission: 01-13-2022 patient presents today for follow-up evaluation although the last time I saw him was actually August of this year. At that time he actually was doing decently well with regard to the wound on the left leg. Unfortunately however it was noted that he had noted poor ABIs and again I somehow missed documented the right side at the previous admission but his right ABI was 0.61 with [name] TBI of 0.38. Based on what I am seeing today and considering his arterial flow which is not very good I do believe that he likely needs to be seen by vascular soon as possible I think that is contributing to his poor healing in regard to this right leg. I discussed that with him today and the individual who I am assuming to be [name] family member that was present with him as well although I did not inquire in order to confirm. 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 He subsequently had an injury where he sustained [name] laceration this was on November 24, 2021. He then ended up with [name] need for reopening this with  incision and drainage on 11-29-2021. I did look through the pictures that his family member had as well from the beginning through now and there has been some improvement though [name] lot of the granulation tissue is somewhat hypergranular there is also some evidence that the bone is drying out of the central portion of the wound. Unfortunately I do not think that this is healing nearly as well as I would like to see. I believe his blood flow is affecting this  significantly and I think that needs to be addressed ASAP. Otherwise the patient's past medical history really has not changed significantly since the last time I saw him. That can be referenced above. 11/10; second visit for this wound on the right anterior upper tibia. As I understand things he initially had [name] fall while putting something in his attic. Initially the wound was sutured but became secondarily infected and had to be opened by general surgery culturing MSSA. He was followed by Dr. Steva Ready of infectious disease and is completed antibiotics [Keflex]. He has completed this. Currently we are using Xeroform with backing Dakin's changing this daily. He has formal arterial studies ordered for vein and vascular next Wednesday apparently in response to [name] ABI of 0.6 Patient is asking about Medihoney which they apparently have in the [facility] clinic. I cannot pick up any claudication by history. Objective Constitutional Sitting or standing Blood Pressure is within target range for patient.. Pulse regular and within target range for patient.[name] Respirations regular, non-labored and within target range.. Temperature is normal and within the target range for the patient.[name] appears in no distress. Vitals Time Taken: 9:27 AM, Height: 75 in, Weight: 168 lbs, BMI: 21, Temperature: 97.2 F, Pulse: 72 bpm, Respiratory Rate: 16 breaths/min, Blood Pressure: 132/65 mmHg. Cardiovascular Pedal pulses not palpable in the right foot however his popliteal pulse is palpable. General Notes: Wound examination right anterior mid tibia fairly substantial wound. The bottom 50% of this has reasonable granulation tissue however approximately there is an area of exposed bone. There is no significant surrounding soft tissue infection that I can pick up on. Integumentary (Hair, Skin) Wound #2 status is Open. Original cause of wound was Laceration. The date acquired was: 11/24/2021. The wound has been in treatment 1  weeks. The wound is located on the Right,Midline,Anterior Lower Leg. The wound measures 5.5cm length x 2.3cm width x 0.4cm depth; 9.935cm^2 area and 3.974cm^3 volume. There is bone and Fat Layer (Subcutaneous Tissue) exposed. There is [name] medium amount of serosanguineous drainage noted. There is small (1-33%) red, friable granulation within the wound bed. There is [name] small (1-33%) amount of necrotic tissue within the wound bed including Necrosis of Bone. 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 Bathing/ Shower/ Hygiene: Wash wounds with antibacterial soap and water. - Wash wound with soap and water. Pat dry with gauze then apply Xeroform gauze over wound bed followed by [name] moistened Dakin's soaked gauze. Moisten with Dakin's only. DO NOT SATURATE. Cover with ABD pad and secure with Kerlix. DO NOT WRAP WITH COBAN. Secure Kerlix with netting or if not available Medipore tape. WOUND #2: - Lower Leg Wound Laterality: Right, Midline, Anterior Cleanser: Soap and Water 1 x Per Day/30 Days Discharge Instructions: Gently cleanse wound with antibacterial soap, rinse and pat  dry prior to dressing wounds Prim Dressing: Gauze 1 x Per Day/30 Days ary Theodore [name], Theodore [name] ([id number]) [id number] Page 6 of 6 Discharge Instructions: As directed: dry, moistened with saline or moistened with Dakins Solution Prim Dressing: Xeroform 4x4-HBD (in/in) 1 x Per Day/30 Days ary Discharge Instructions: Apply Xeroform 4x4-HBD (in/in) as directed Secondary Dressing: ABD Pad 5x9 (in/in) 1 x Per Day/30 Days Discharge Instructions: Cover with ABD pad Secured With: Conform 4'' - Conforming Stretch Gauze Bandage 4x75 (in/in) 1 x Per Day/30  Days Discharge Instructions: Apply as directed Secured With: Kerlix Roll Sterile or Non-Sterile 6-ply 4.5x4 (yd/yd) 1 x Per Day/30 Days Discharge Instructions: Apply Kerlix as directed 1. Initially traumatic wound on the right and anterior lower leg. 2. I agree that his vascular supply to the leg is somewhat suspect and formal arterial studies have been ordered for next week. The question will be the degree of flow to the wound in question in terms of whether he needs to go on and have angiography 3. With regards to any dressing here including what were putting on this or Medihoney or anything else I think this is strictly [name] secondary issue with regards to blood flow and I explained this to the patient. I did not change the primary dressing currently 4. If there is adequate blood flow to this wound he will likely need further cultures perhaps bone scrapings for PCR and ultimately probably some form of skin graft 5. He has completed his antibiotics as directed by Dr. Steva Ready of infectious disease Electronic Signature(s) Signed: 01/20/2022 12:57:19 PM By: Theodore Ham MD Entered By: Theodore [name] on 01/20/2022 10:00:52 -------------------------------------------------------------------------------- SuperBill Details Patient Name: Date of Service: Theodore [name]. 01/20/2022 Medical Record [name]: [id number] Patient Account [name]: [id number] Date of Birth/Sex: Treating RN: 06/26/1929 (88 y.o. 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

## 2022-01-22 ENCOUNTER — Other Ambulatory Visit: Payer: Self-pay | Admitting: Cardiovascular Disease

## 2022-01-22 ENCOUNTER — Other Ambulatory Visit: Payer: Self-pay | Admitting: Internal Medicine

## 2022-01-23 ENCOUNTER — Other Ambulatory Visit (INDEPENDENT_AMBULATORY_CARE_PROVIDER_SITE_OTHER): Payer: PPO

## 2022-01-23 ENCOUNTER — Telehealth: Payer: Self-pay | Admitting: Cardiovascular Disease

## 2022-01-23 DIAGNOSIS — R944 Abnormal results of kidney function studies: Secondary | ICD-10-CM | POA: Diagnosis not present

## 2022-01-23 NOTE — Telephone Encounter (Signed)
Please schedule 12 month F/U appt for 90 day refills. Thank you! 

## 2022-01-23 NOTE — Telephone Encounter (Signed)
LVM to schedule fu appt, please schedule 

## 2022-01-24 ENCOUNTER — Encounter: Payer: PPO | Admitting: Speech Pathology

## 2022-01-24 LAB — BASIC METABOLIC PANEL
BUN: 28 mg/dL — ABNORMAL HIGH (ref 6–23)
CO2: 28 mEq/L (ref 19–32)
Calcium: 8.7 mg/dL (ref 8.4–10.5)
Chloride: 103 mEq/L (ref 96–112)
Creatinine, Ser: 1.19 mg/dL (ref 0.40–1.50)
GFR: 53.73 mL/min — ABNORMAL LOW (ref >60.00)
Glucose, Bld: 106 mg/dL — ABNORMAL HIGH (ref 70–99)
Potassium: 4.1 mEq/L (ref 3.5–5.1)
Sodium: 137 mEq/L (ref 135–145)

## 2022-01-25 ENCOUNTER — Encounter (INDEPENDENT_AMBULATORY_CARE_PROVIDER_SITE_OTHER): Payer: Self-pay | Admitting: Nurse Practitioner

## 2022-01-25 ENCOUNTER — Ambulatory Visit (INDEPENDENT_AMBULATORY_CARE_PROVIDER_SITE_OTHER): Payer: PPO

## 2022-01-25 ENCOUNTER — Ambulatory Visit (INDEPENDENT_AMBULATORY_CARE_PROVIDER_SITE_OTHER): Payer: PPO | Admitting: Nurse Practitioner

## 2022-01-25 VITALS — BP 126/72 | HR 72 | Resp 16 | Wt 168.0 lb

## 2022-01-25 DIAGNOSIS — I739 Peripheral vascular disease, unspecified: Secondary | ICD-10-CM

## 2022-01-25 DIAGNOSIS — E785 Hyperlipidemia, unspecified: Secondary | ICD-10-CM | POA: Diagnosis not present

## 2022-01-26 ENCOUNTER — Ambulatory Visit: Payer: PPO | Admitting: Physician Assistant

## 2022-01-26 ENCOUNTER — Encounter: Payer: PPO | Admitting: Speech Pathology

## 2022-01-27 ENCOUNTER — Encounter: Payer: PPO | Admitting: Physician Assistant

## 2022-01-27 ENCOUNTER — Other Ambulatory Visit: Payer: Self-pay | Admitting: Physician Assistant

## 2022-01-27 ENCOUNTER — Other Ambulatory Visit
Admission: RE | Admit: 2022-01-27 | Discharge: 2022-01-27 | Disposition: A | Discharge status: Home / Self Care | Payer: PPO | Source: Ambulatory Visit | Attending: Physician Assistant | Admitting: Physician Assistant

## 2022-01-27 DIAGNOSIS — B999 Unspecified infectious disease: Secondary | ICD-10-CM | POA: Diagnosis not present

## 2022-01-27 DIAGNOSIS — L97812 Non-pressure chronic ulcer of other part of right lower leg with fat layer exposed: Secondary | ICD-10-CM | POA: Diagnosis not present

## 2022-01-27 DIAGNOSIS — M9902 Segmental and somatic dysfunction of thoracic region: Secondary | ICD-10-CM | POA: Diagnosis not present

## 2022-01-27 DIAGNOSIS — M9904 Segmental and somatic dysfunction of sacral region: Secondary | ICD-10-CM | POA: Diagnosis not present

## 2022-01-27 DIAGNOSIS — M9903 Segmental and somatic dysfunction of lumbar region: Secondary | ICD-10-CM | POA: Diagnosis not present

## 2022-01-27 DIAGNOSIS — M9901 Segmental and somatic dysfunction of cervical region: Secondary | ICD-10-CM | POA: Diagnosis not present

## 2022-01-27 DIAGNOSIS — L97816 Non-pressure chronic ulcer of other part of right lower leg with bone involvement without evidence of necrosis: Secondary | ICD-10-CM | POA: Diagnosis not present

## 2022-01-27 NOTE — Progress Notes (Addendum)
Theodore, KOPE [name] ([id number]) [id number] Page 1 of 8 Visit Report for 01/27/2022 Chief Complaint Document Details Patient Name: Date of Service: Theodore, Theodore [name] 01/27/2022 8:45 [name] Medical Record Number: [id number] Patient Account Number: [id number] Date of Birth/Sex: Treating RN: February 06, 1930 (88 y.o. Theodore [name]) [name] Primary Care Provider: [name] Other Clinician: Referring Provider: Treating Provider/Extender: [name] Weeks in Treatment: 2 Information Obtained from: Patient Chief Complaint Right LE Ulcer Electronic Signature(s) Signed: 01/27/2022 9:06:43 AM By: Worthy Keeler PA-C Entered By: Worthy Keeler on 01/27/2022 09:06:43 -------------------------------------------------------------------------------- Debridement Details Patient Name: Date of Service: Theodore [name], [name]. 01/27/2022 8:45 [name] Medical Record Number: [id number] Patient Account Number: [id number] Date of Birth/Sex: Treating RN: September 28, 1929 (88 y.o. Theodore [name] Primary Care Provider: [name] Other Clinician: Referring Provider: Treating Provider/Extender: [name] Weeks in Treatment: 2 Debridement Performed for Assessment: Wound #2 Right,Midline,Anterior Lower Leg Performed By: Physician Tommie Sams., PA-C Debridement Type: Debridement Level of Consciousness (Pre-procedure): Awake and Alert Pre-procedure Verification/Time Out Yes - 09:14 Taken: Start Time: 09:14 T Area Debrided (L x W): otal 2 (cm) x 1 (cm) = 2 (cm) Tissue and other material debrided: Viable, Non-Viable, Bone, Subcutaneous, Skin: Dermis , Skin: Epidermis Level: Skin/Subcutaneous Tissue/Muscle/Bone Debridement Description: Excisional Instrument: Curette Specimen: Tissue Culture Number of Specimens T aken: 2 Bleeding: Minimum Hemostasis Achieved: Pressure End Time: 09:20 Procedural Pain: 0 Post Procedural Pain: 0 [name], Theodore [name] ([id number])  [id number] Page 2 of 8 Response to Treatment: Procedure was tolerated well Level of Consciousness (Post- Awake and Alert procedure): Post Debridement Measurements of Total Wound Length: (cm) 5 Width: (cm) 2.5 Depth: (cm) 0.4 Volume: (cm) 3.927 Character of Wound/Ulcer Post Debridement: Improved Post Procedure Diagnosis Same as Pre-procedure Electronic Signature(s) Signed: 01/30/2022 8:00:39 AM By: [name] RN Signed: 01/30/2022 8:11:37 AM By: Worthy Keeler PA-C Entered By: [name] on 01/30/2022 08:00:39 -------------------------------------------------------------------------------- HPI Details Patient Name: Date of Service: Theodore [name], [name]. 01/27/2022 8:45 [name] Medical Record Number: [id number] Patient Account Number: [id number] Date of Birth/Sex: Treating RN: 11/23/1929 (88 y.o. Theodore [name] Primary Care Provider: [name] Other Clinician: Referring Provider: Treating Provider/Extender: [name] in Treatment: 2 History of Present Illness HPI Description: ADMISSION 10/03/2021 This is [name] 88 year old man who was pulling [name] cart 3 months ago he had some sort of imbalance issue hitting his left anterior lower leg. He is not sure exactly what he hit. He is able to show me photos on his phone that show [name] laceration on the left anterior lower leg in an inverted L-shaped and. He initially did very little to this himself. Saw his primary doctor on 09/21/2021 [place] instructed him to put Vaseline gauze on this which he did for short period of time. He says that things are getting [name] lot smaller and are improving he has not really been treating this with anything. The other major problem the patient has is peripheral arterial disease. He is followed by vein and vascular and has had [name] series of follow-up arterial studies. Most recently this was done on 09/30/2021 showing on the left and ABI of 0.49 with [name] great toe pressure of  0.47 and monophasic waveforms. This is somewhat worse than test he had done on 03/21/2021 at which time the ABI was 0.68 TBI of 0.51. I think because of his age and the fact he has underlying stage IIIa chronic kidney disease and the fact the wound is healing they  have not been scheduling him for anything aggressive but follow-up studies are apparently booked for 75-month Past medical history includes coronary artery disease, lumbar radiculopathy, COPD which is limiting and stage IIIa chronic renal failure. He has PAD as noted. He also tells me that he had [name] surgical procedure for essential tremor which left him with some mild dysarthria and gait and balance problems 10-10-2021 upon evaluation today patient appears to be doing decently well in regard to his wound all things considered. He is very slow to heal due to his low ABIs. With that being said it does appear that he is making some progress nonetheless there is little bit of dry skin and tissue around the edges of the wound I think [name] very light debridement to clear this away could be of benefit for him. The patient is in agreement with that plan. For that reason I did actually perform debridement to clear that away today but did so extremely carefully. 10-17-2021 upon evaluation today patient appears to be doing well currently in regard to his wound in fact this appears to be completely healed based on what I am seeing today. I do not see any evidence of active infection locally or systemically which is great news. No fevers, chills, nausea, vomiting, or diarrhea. Readmission: 01-13-2022 patient presents today for follow-up evaluation although the last time I saw him was actually August of this year. At that time he actually was doing decently well with regard to the wound on the left leg. Unfortunately however it was noted that he had noted poor ABIs and again I somehow missed documented the right side at the previous admission but his right ABI was  0.61 with [name] TBI of 0.38. Based on what I am seeing today and considering his arterial flow which is not very good I do believe that he likely needs to be seen by vascular soon as possible I think that is contributing to his poor healing in regard to this right leg. I discussed that with him today and the individual who I am assuming to be [name] family member that was present with him as well although I did not inquire in order to confirm. 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 He subsequently had an injury where he sustained [name] laceration this was on November 24, 2021. He then ended up with [name] need for reopening this with incision and drainage on 11-29-2021. I did look through the pictures that his family member had as well from the beginning through now and there has been some improvement though [name] lot of the granulation tissue is somewhat hypergranular there is also some evidence that the bone is drying out of the central portion of the wound. Unfortunately I do not think that this is SNIZAR, CUTLER([id number] Page 3 of 8 healing nearly as well as I would like to see. I believe his blood flow is affecting this significantly and I think that needs to be addressed ASAP. Otherwise the patient's past medical history really has not changed significantly since the last time I saw him. That can be referenced above. 11/10; second visit for this wound on the right anterior upper tibia. As I understand things he initially had [name] fall while putting something in his attic. Initially the wound was sutured but became secondarily infected and had to be opened by general surgery culturing MSSA. He was followed by Dr. RSteva Readyof infectious disease and is completed antibiotics [Keflex]. He has  completed this. Currently we are using Xeroform with backing Dakin's changing this daily. He has formal arterial studies ordered for vein and vascular next  Wednesday apparently in response to [name] ABI of 0.6 Patient is asking about Medihoney which they apparently have in the [facility] clinic. I cannot pick up any claudication by history. 01-27-2022 upon evaluation today patient's wound is doing [name] little bit better and overall appearance fortunately there does not appear to be any signs of infection locally or systemically at this time which is great news and overall I am extremely pleased with where we stand. No fevers, chills, nausea, vomiting, or diarrhea. He did have his arterial study which I did review today. He is ABI on the right was 0.84 with [name] TBI of 0.97 on the left was 0.62 with [name] TBI of 0.75. This actually indicates fairly good blood flow all things considered. Fortunately I do not see any evidence of significant infection at this point as well which is also good news. Electronic Signature(s) Signed: 01/27/2022 10:06:19 AM By: Worthy Keeler PA-C Entered By: Worthy Keeler on 01/27/2022 38:75:64 -------------------------------------------------------------------------------- Physical Exam Details Patient Name: Date of Service: Theodore [name], [name]. 01/27/2022 8:45 [name] Medical Record Number: [id number] Patient Account Number: [id number] Date of Birth/Sex: Treating RN: 04/19/1929 (88 y.o. Theodore [name] Primary Care Provider: [name] Other Clinician: Referring Provider: Treating Provider/Extender: [name] Weeks in Treatment: 2 Constitutional Well-nourished and well-hydrated in no acute distress. Respiratory normal breathing without difficulty. Psychiatric this patient is able to make decisions and demonstrates good insight into disease process. Alert and Oriented x 3. pleasant and cooperative. Notes Upon inspection patient's wound bed actually showed signs of some good granulation and epithelization around the edges of the wound although the bone is still exposed and does have some dark discolored tissue. I am  actually to perform debridement to clearway some of the necrotic bone today. I think that we can safely do this based on his arterial study. My hope is this will stimulate some healing and will also subsequently help with the new epithelization and granulation over top of the wound in general especially over this bone region. Electronic Signature(s) Signed: 01/27/2022 10:07:21 AM By: Worthy Keeler PA-C Entered By: Worthy Keeler on 01/27/2022 10:07:21 Physician Orders Details -------------------------------------------------------------------------------- [name] ([id number]) [id number] Page 4 of 8 Patient Name: Date of Service: Theodore [name], Theodore [name] 01/27/2022 8:45 [name] Medical Record Number: [id number] Patient Account Number: [id number] Date of Birth/Sex: Treating RN: 1929/03/24 (88 y.o. 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 - patient seen at [facility] Fax [phone] Return [name] Bathing/ Shower/ Hygiene Wash wounds with antibacterial soap and water. May shower; gently cleanse wound with antibacterial soap, rinse and pat dry prior to dressing wounds Anesthetic (Use 'Patient Medications' Section for Anesthetic Order Entry) Lidocaine applied to wound bed Edema Control - Lymphedema / Segmental Compressive Device / Other Elevate, Exercise Daily and [name] void Standing for Long Periods of Time. Elevate legs to the level of the heart and pump ankles as often as possible Elevate  leg(s) parallel to the floor when sitting. Wound Treatment Wound #2 - Lower Leg Wound Laterality: Right, Midline, Anterior Cleanser: Soap and Water Every Other Day/30 Days Discharge Instructions: Gently cleanse wound with antibacterial soap, rinse and pat dry prior to dressing wounds Prim Dressing: Prisma 4.34 (in) Every Other Day/30 Days ary Discharge Instructions: Moisten w/normal saline or sterile water; Cover wound as directed. Do not remove from wound bed. Secondary Dressing: Xeroform Petrolatum Gauze, Overwrap 1x8 (in/in) Every Other Day/30 Days Secured With: Kerlix Roll Sterile or Non-Sterile 6-ply 4.5x4 (yd/yd) Every Other Day/30 Days Discharge Instructions: Apply Kerlix as directed Secured With: Tubigrip Size D, 3x10 (in/yd) Every Other Day/30 Days Discharge Instructions: single layer Laboratory Bacteria identified in Tissue by Biopsy culture (MICRO) - non healing wound - (ICD10 S96.283 - Non-pressure chronic ulcer of other part of right lower leg with fat layer exposed) LOINC Code: 66294-7 Convenience Name: Biopsy specimen culture Bacteria identified in Wound by Culture (MICRO) - non healing wound - (ICD10 M54.650 - Non-pressure chronic ulcer of other part of right lower leg with fat layer exposed) LOINC Code: 3546-5 Convenience Name: Wound culture routine Electronic Signature(s) Signed: 01/27/2022 1:04:08 PM By: [name] RN Signed: 01/27/2022 2:32:55 PM By: Worthy Keeler PA-C Entered By: [name] on 01/27/2022 Theodore [name], Theodore [name] ([id number]) [id number] Page 5 of 8 -------------------------------------------------------------------------------- Problem List Details Patient Name: Date of Service: Theodore [name], Theodore [name] 01/27/2022 8:45 [name] Medical Record Number: [id number] Patient Account Number: [id number] Date of Birth/Sex: Treating RN: February 07, 1930 (88 y.o. 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 01/27/2022 8:45 [name] Medical Record Number: [id number] Patient Account Number: [id number] Date of Birth/Sex: Treating RN: November 18, 1929 (88 y.o. Theodore [name] Primary Care Provider: [name] Other Clinician: Referring Provider: Treating Provider/Extender: [name] Weeks in Treatment: 2 Subjective Chief Complaint Theodore [name], Theodore [name] ([id number]) [id number] Page 6 of 8 Information obtained from Patient Right LE Ulcer History of Present Illness (HPI) ADMISSION 10/03/2021 This is [name] 88 year old man who was pulling [name] cart 3 months ago he had some sort of imbalance issue hitting his left anterior lower leg. He is not sure exactly what he hit. He is able to show me photos on his phone that show [name] laceration on the left anterior lower leg in an inverted L-shaped and. He initially did very little to this himself. Saw his primary doctor on 09/21/2021 [place] instructed him to put Vaseline gauze on this which he did for short period of time. He says that things are getting [name] lot smaller and are improving he has not really been treating this with anything. The other major problem the patient has  is peripheral arterial disease. He is followed by vein and vascular and has had [name] series of follow-up arterial studies. Most recently this was done on 09/30/2021 showing on the left and ABI of 0.49 with [name] great  toe pressure of 0.47 and monophasic waveforms. This is somewhat worse than test he had done on 03/21/2021 at which time the ABI was 0.68 TBI of 0.51. I think because of his age and the fact he has underlying stage IIIa chronic kidney disease and the fact the wound is healing they have not been scheduling him for anything aggressive but follow-up studies are apparently booked for 11-month Past medical history includes coronary artery disease, lumbar radiculopathy, COPD which is limiting and stage IIIa chronic renal failure. He has PAD as noted. He also tells me that he had [name] surgical procedure for essential tremor which left him with some mild dysarthria and gait and balance problems 10-10-2021 upon evaluation today patient appears to be doing decently well in regard to his wound all things considered. He is very slow to heal due to his low ABIs. With that being said it does appear that he is making some progress nonetheless there is little bit of dry skin and tissue around the edges of the wound I think [name] very light debridement to clear this away could be of benefit for him. The patient is in agreement with that plan. For that reason I did actually perform debridement to clear that away today but did so extremely carefully. 10-17-2021 upon evaluation today patient appears to be doing well currently in regard to his wound in fact this appears to be completely healed based on what I am seeing today. I do not see any evidence of active infection locally or systemically which is great news. No fevers, chills, nausea, vomiting, or diarrhea. Readmission: 01-13-2022 patient presents today for follow-up evaluation although the last time I saw him was actually August of this year. At that time he actually was  doing decently well with regard to the wound on the left leg. Unfortunately however it was noted that he had noted poor ABIs and again I somehow missed documented the right side at the previous admission but his right ABI was 0.61 with [name] TBI of 0.38. Based on what I am seeing today and considering his arterial flow which is not very good I do believe that he likely needs to be seen by vascular soon as possible I think that is contributing to his poor healing in regard to this right leg. I discussed that with him today and the individual who I am assuming to be [name] family member that was present with him as well although I did not inquire in order to confirm. 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 He subsequently had an injury where he sustained [name] laceration this was on November 24, 2021. He then ended up with [name] need for reopening this with incision and drainage on 11-29-2021. I did look through the pictures that his family member had as well from the beginning through now and there has been some improvement though [name] lot of the granulation tissue is somewhat hypergranular there is also some evidence that the bone is drying out of the central portion of the wound. Unfortunately I do not think that this is healing nearly as well as I would like to see. I believe his blood flow is affecting this significantly and I think that needs to be addressed ASAP. Otherwise the patient's past medical history really has not changed significantly since the last time I saw him. That can be referenced above. 11/10; second visit for this wound on the right anterior upper tibia. As I understand things he initially  had [name] fall while putting something in his attic. Initially the wound was sutured but became secondarily infected and had to be opened by general surgery culturing MSSA. He was followed by Dr. Steva Ready of infectious disease and is completed antibiotics [Keflex]. He has completed  this. Currently we are using Xeroform with backing Dakin's changing this daily. He has formal arterial studies ordered for vein and vascular next Wednesday apparently in response to [name] ABI of 0.6 Patient is asking about Medihoney which they apparently have in the [facility] clinic. I cannot pick up any claudication by history. 01-27-2022 upon evaluation today patient's wound is doing [name] little bit better and overall appearance fortunately there does not appear to be any signs of infection locally or systemically at this time which is great news and overall I am extremely pleased with where we stand. No fevers, chills, nausea, vomiting, or diarrhea. He did have his arterial study which I did review today. He is ABI on the right was 0.84 with [name] TBI of 0.97 on the left was 0.62 with [name] TBI of 0.75. This actually indicates fairly good blood flow all things considered. Fortunately I do not see any evidence of significant infection at this point as well which is also good news. Objective Constitutional Well-nourished and well-hydrated in no acute distress. Vitals Time Taken: 8:47 AM, Height: 75 in, Weight: 168 lbs, BMI: 21, Temperature: 97.9 F, Pulse: 83 bpm, Respiratory Rate: 18 breaths/min, Blood Pressure: 127/73 mmHg. Respiratory normal breathing without difficulty. Psychiatric this patient is able to make decisions and demonstrates good insight into disease process. Alert and Oriented x 3. pleasant and cooperative. General Notes: Upon inspection patient's wound bed actually showed signs of some good granulation and epithelization around the edges of the wound although the bone is still exposed and does have some dark discolored tissue. I am actually to perform debridement to clearway some of the necrotic bone today. I think that we can safely do this based on his arterial study. My hope is this will stimulate some healing and will also subsequently help with the new epithelization and granulation  over top of the wound in general especially over this bone region. Integumentary (Hair, Skin) Wound #2 status is Open. Original cause of wound was Laceration. The date acquired was: 11/24/2021. The wound has been in treatment 2 weeks. The wound is located on the Right,Midline,Anterior Lower Leg. The wound measures 5cm length x 2.5cm width x 0.4cm depth; 9.817cm^2 area and 3.927cm^3 volume. There is bone and Fat Layer (Subcutaneous Tissue) exposed. There is no tunneling or undermining noted. There is [name] medium amount of serosanguineous drainage noted. There is small (1-33%) red, friable granulation within the wound bed. There is [name] small (1-33%) amount of necrotic tissue within the wound bed including Necrosis of Bone. 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 There was [name] Excisional Skin/Subcutaneous Tissue/Muscle/Bone Debridement with [name] total area of 2 sq cm performed by Tommie Sams., PA-C. With the following instrument(s): Curette to remove Viable and Non-Viable tissue/material. Material removed includes Bone,Subcutaneous Tissue, Skin: Dermis, and Skin: Epidermis. 2 specimens were taken by [name] Tissue Culture and sent to the lab per facility  protocol. [name] time out was conducted at 09:14, prior to the start of the procedure. [name] Minimum amount of bleeding was controlled with Pressure. The procedure was tolerated well with [name] pain level of 0 throughout and [name] pain level of 0 following the procedure. Post Debridement Measurements: 5cm length x 2.5cm width x 0.4cm depth; 3.927cm^3 volume. Character of  Wound/Ulcer Post Debridement is improved. Post procedure Diagnosis Wound #2: Same as Pre-Procedure Plan Follow-up Appointments: Return Appointment in 1 week. - patient seen at [facility] Fax [phone] Bathing/ Shower/ Hygiene: Wash wounds with antibacterial soap and water. May shower; gently cleanse wound with antibacterial soap, rinse and pat dry prior to dressing wounds Anesthetic (Use 'Patient Medications' Section for Anesthetic Order Entry): Lidocaine applied to wound bed Edema Control - Lymphedema / Segmental Compressive Device / Other: Elevate, Exercise Daily and Avoid Standing for Long Periods of Time. Elevate legs to the level of the heart and pump ankles as often as possible Elevate leg(s) parallel to the floor when sitting. Laboratory ordered were: Biopsy specimen culture - non healing wound, Wound culture routine - non healing wound WOUND #2: - Lower Leg Wound Laterality: Right, Midline, Anterior Cleanser: Soap and Water Every Other Day/30 Days Discharge Instructions: Gently cleanse wound with antibacterial soap, rinse and pat dry prior to dressing wounds Prim Dressing: Prisma 4.34 (in) Every Other Day/30 Days ary Discharge Instructions: Moisten w/normal saline or sterile water; Cover wound as directed. Do not remove from wound bed. Secondary Dressing: Xeroform Petrolatum Gauze, Overwrap 1x8 (in/in) Every Other Day/30 Days Secured With: Kerlix Roll Sterile or Non-Sterile 6-ply 4.5x4 (yd/yd) Every Other Day/30 Days Discharge Instructions: Apply Kerlix as directed Secured With: Tubigrip Size D, 3x10 (in/yd) Every Other Day/30 Days Discharge Instructions: single layer 1. Based on what I am seeing I do believe that the patient would benefit from [name] continuation of treatment with the Xeroform to keep this bone from drying out. With that being said I am going to go ahead and initiate treatment as well with [name] silver collagen over the wound in general especially over the bone  region to try to see if we get some epithelization over top. 2. I am also can recommend that we have the patient continue to monitor for any signs of worsening or infection. Office if anything changes he knows contact the office and let me know. 3. I would also suggest that we send in [name] pathology as well as culture of the bone samples. The samples were obtained during the debridement today and the patient tolerated this without complication. We will see patient back for reevaluation in 1 week here in the clinic. If anything worsens or changes patient will contact our office for additional recommendations. Electronic Signature(s) Signed: 01/30/2022 8:05:00 AM By: Worthy Keeler PA-C Previous Signature: 01/27/2022 2:13:11 PM Version By: Worthy Keeler PA-C Previous Signature: 01/27/2022 10:08:06 AM Version By: Worthy Keeler PA-C Entered By: Worthy Keeler on 01/30/2022 08:05:00 Theodore [name], Theodore [name] ([id number]) [id number] Page 8 of 8 -------------------------------------------------------------------------------- SuperBill Details Patient Name: Date of Service: Theodore [name], Theodore [name] 01/27/2022 Medical Record Number: [id number] Patient Account Number: [id number] Date of Birth/Sex: Treating RN: [birth year] (88 y.o. 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 Facility Procedures : CPT4 Code: 40981191 Description: 47829 - DEB BONE 20 SQ CM/< ICD-10 Diagnosis Description F62.130 Non-pressure chronic ulcer of other part of right lower leg with fat layer ex Modifier:  posed Quantity: 1 Physician Procedures : CPT4: Description Modifier Code [id number] Debridement; bone (includes epidermis, dermis, subQ tissue, muscle and/or fascia, if performed) 1st 20 sqcm or less ICD-10 Diagnosis Description Q65.784 Non-pressure chronic ulcer of other part of right lower leg  with fat layer exposed Quantity: 1 Electronic Signature(s) Signed: 01/30/2022 8:11:27 AM By: Worthy Keeler PA-C Entered By: Worthy Keeler on 01/30/2022 08:11:27

## 2022-01-27 NOTE — Progress Notes (Signed)
TYRIEK, HOFMAN [name] ([id number]) [id number] Page 1 of 6 Visit Report for 01/27/2022 Arrival Information Details Patient Name: Date of Service: Theodore [name], Theodore [name] 01/27/2022 8:45 [name] Medical Record Number: [id number] Patient Account Number: [id number] Date of Birth/Sex: Treating RN: 10-Sep-1929 (88 y.o. Theodore [name]) Theodore [name] Primary Care Theodore [name]: Theodore [name] Other Clinician: Referring Theodore [name]: Treating Theodore [name]/Extender: Theodore [name] in Treatment: 2 Visit Information History Since Last Visit All ordered tests and consults were completed: No Patient Arrived: Ambulatory Added or deleted any medications: No Arrival Time: 08:42 Any new allergies or adverse reactions: No Accompanied By: wife Had [name] fall or experienced change in No Transfer Assistance: None activities of daily living that may affect Patient Identification Verified: Yes risk of falls: Secondary Verification Process Completed: Yes Signs or symptoms of abuse/neglect since last visito No Patient Requires Transmission-Based Precautions: No Hospitalized since last visit: No Patient Has Alerts: Yes Implantable device outside of the clinic excluding No Patient Alerts: ABI R .61 09/30/21 TBI .38 cellular tissue based products placed in the center ABI L .49 09/30/21 TBI .47 since last visit: Has Dressing in Place as Prescribed: Yes Pain Present Now: No Electronic Signature(s) Signed: 01/27/2022 1:14:49 PM By: Theodore Coria RN Entered By: Theodore [name] on 01/27/2022 08:47:14 -------------------------------------------------------------------------------- Lower Extremity Assessment Details Patient Name: Date of Service: Theodore [name], Theodore [name] 01/27/2022 8:45 [name] Medical Record Number: [id number] Patient Account Number: [id number] Date of Birth/Sex: Treating RN: 04/20/1929 (88 y.o. Theodore [name] Primary Care Theodore [name]: Theodore [name] Other Clinician: Referring Theodore [name]: Treating Theodore [name]/Extender:  Theodore [name] in Treatment: 2 Edema Assessment Assessed: [Left: No] [Right: No] Edema: [Left: Ye] [Right: s] Calf Left: Right: Point of Measurement: From Medial Instep 36 cm Theodore [name] ([id number]) [id number] Page 2 of 6 Ankle Left: Right: Point of Measurement: From Medial Instep 21 cm Vascular Assessment Pulses: Dorsalis Pedis Palpable: [Right:Yes] Electronic Signature(s) Signed: 01/27/2022 1:14:49 PM By: Theodore Coria RN Entered By: Theodore [name] on 01/27/2022 08:58:13 -------------------------------------------------------------------------------- Multi Wound Chart Details Patient Name: Date of Service: Theodore [name], [name]. 01/27/2022 8:45 [name] Medical Record Number: [id number] Patient Account Number: [id number] Date of Birth/Sex: Treating RN: 17-Dec-1929 (88 y.o. Theodore [name]) Theodore [name] Primary Care Shaundra Fullam: Theodore [name] Other Clinician: Referring Theodore [name]: Treating Theodore [name]/Extender: Theodore [name] in Treatment: 2 Vital Signs Height(in): 75 Pulse(bpm): 83 Weight(lbs): 168 Blood Pressure(mmHg): 127/73 Body Mass Index(BMI): 21 Temperature(F): 97.9 Respiratory Rate(breaths/min): 18 [2:Photos:] [N/[name]:N/[name]] Right, Midline, Anterior Lower Leg N/[name] N/[name] Wound Location: Laceration N/[name] N/[name] Wounding Event: Atypical N/[name] N/[name] Primary Etiology: Trauma, Other N/[name] N/[name] Secondary Etiology: Chronic Obstructive Pulmonary N/[name] N/[name] Comorbid History: Disease (COPD), Peripheral Arterial Disease 11/24/2021 N/[name] N/[name] Date Acquired: 2 N/[name] N/[name] [name] of Treatment: Open N/[name] N/[name] Wound Status: No N/[name] N/[name] Wound Recurrence: 5x2.5x0.4 N/[name] N/[name] Measurements L x W x D (cm) 9.817 N/[name] N/[name] [name] (cm) : rea 3.927 N/[name] N/[name] Volume (cm) : -0.20% N/[name] N/[name] % Reduction in Area: 42.80% N/[name] N/[name] % Reduction in Volume: Full Thickness With Exposed Support N/[name] N/[name] Classification: Structures Medium N/[name] N/[name] Exudate Amount: Theodore [name] ([id number])  [id number] Page 3 of 6 Serosanguineous N/[name] N/[name] Exudate Type: red, brown N/[name] N/[name] Exudate Color: Small (1-33%) N/[name] N/[name] Granulation Amount: Red, Friable N/[name] N/[name] Granulation Quality: Small (1-33%) N/[name] N/[name] Necrotic Amount: Fat Layer (Subcutaneous Tissue): Yes N/[name] N/[name] Exposed Structures: Bone: Yes Fascia: No Tendon: No Muscle: No Joint: No Small (1-33%) N/[name] N/[name] Epithelialization: Treatment Notes Electronic Signature(s) Signed: 01/27/2022 1:14:49 PM By:  Theodore Coria RN Entered By: Theodore [name] on 01/27/2022 09:11:36 -------------------------------------------------------------------------------- Multi-Disciplinary Care Plan Details Patient Name: Date of Service: Theodore [name], Theodore [name] 01/27/2022 8:45 [name] Medical Record Number: [id number] Patient Account Number: [id number] Date of Birth/Sex: Treating RN: 07/16/1929 (88 y.o. Theodore [name]) Theodore [name] Primary Care Theodore [name]: Theodore [name] Other Clinician: Referring Theodore [name]: Treating Theodore [name]/Extender: Theodore [name] in Treatment: 2 Active Inactive Necrotic Tissue Nursing Diagnoses: Impaired tissue integrity related to necrotic/devitalized tissue Knowledge deficit related to management of necrotic/devitalized tissue Goals: Necrotic/devitalized tissue will be minimized in the wound bed Date Initiated: 01/20/2022 Target Resolution Date: 02/19/2022 Goal Status: Active Patient/caregiver will verbalize understanding of reason and process for debridement of necrotic tissue Date Initiated: 01/20/2022 Target Resolution Date: 02/19/2022 Goal Status: Active Interventions: Assess patient pain level pre-, during and post procedure and prior to discharge Provide education on necrotic tissue and debridement process Treatment Activities: Apply topical anesthetic as ordered : 01/20/2022 Excisional debridement : 01/20/2022 Notes: Wound/Skin Impairment Nursing Diagnoses: Impaired tissue integrity Knowledge  deficit related to smoking impact on wound healing Knowledge deficit related to ulceration/compromised skin integrity Theodore [name] ([id number]) [id number] Page 4 of 6 Goals: Patient will demonstrate [name] reduced rate of smoking or cessation of smoking Date Initiated: 01/13/2022 Target Resolution Date: 02/12/2022 Goal Status: Active Patient will have [name] decrease in wound volume by X% from date: (specify in notes) Date Initiated: 01/13/2022 Target Resolution Date: 02/12/2022 Goal Status: Active Patient/caregiver will verbalize understanding of skin care regimen Date Initiated: 01/13/2022 Target Resolution Date: 02/12/2022 Goal Status: Active Ulcer/skin breakdown will have [name] volume reduction of 30% by week 4 Date Initiated: 01/13/2022 Target Resolution Date: 02/12/2022 Goal Status: Active Ulcer/skin breakdown will have [name] volume reduction of 50% by week 8 Date Initiated: 01/13/2022 Target Resolution Date: 03/15/2022 Goal Status: Active Ulcer/skin breakdown will have [name] volume reduction of 80% by week 12 Date Initiated: 01/13/2022 Target Resolution Date: 04/15/2022 Goal Status: Active Ulcer/skin breakdown will heal within 14 [name] Date Initiated: 01/13/2022 Target Resolution Date: 05/14/2022 Goal Status: Active Interventions: Assess patient/caregiver ability to obtain necessary supplies Assess patient/caregiver ability to perform ulcer/skin care regimen upon admission and as needed Assess ulceration(s) every visit Provide education on ulcer and skin care Screen for HBO Treatment Activities: Skin care regimen initiated : 01/13/2022 Topical wound management initiated : 01/13/2022 Notes: Electronic Signature(s) Signed: 01/27/2022 1:14:49 PM By: Theodore Coria RN Entered By: Theodore [name] on 01/27/2022 08:58:42 -------------------------------------------------------------------------------- Pain Assessment Details Patient Name: Date of Service: Theodore [name], Theodore [name] 01/27/2022 8:45  [name] Medical Record Number: [id number] Patient Account Number: [id number] Date of Birth/Sex: Treating RN: August 30, 1929 (88 y.o. Theodore [name] Primary Care Bridgid Printz: Theodore [name] Other Clinician: Referring Aynslee Mulhall: Treating Alayha Babineaux/Extender: Theodore [name] in Treatment: 2 Active Problems Location of Pain Severity and Description of Pain Patient Has Paino No Site Locations CLAVIN, RUHLMAN [name] ([id number]) [id number] Page 5 of 6 Pain Management and Medication Current Pain Management: Electronic Signature(s) Signed: 01/27/2022 1:14:49 PM By: Theodore Coria RN Entered By: Theodore [name] on 01/27/2022 08:54:27 -------------------------------------------------------------------------------- Wound Assessment Details Patient Name: Date of Service: Theodore [name], [name]. 01/27/2022 8:45 [name] Medical Record Number: [id number] Patient Account Number: [id number] Date of Birth/Sex: Treating RN: 12-01-1929 (88 y.o. Theodore [name] Primary Care Mylah Baynes: Theodore [name] Other Clinician: Referring Yuliet Needs: Treating Aristotelis Vilardi/Extender: Theodore [name] in Treatment: 2 Wound Status Wound Number: 2 Primary Atypical Etiology: Wound Location: Right, Midline, Anterior Lower Leg Secondary Trauma, Other Wounding Event: Laceration Etiology: Date Acquired: 11/24/2021  Wound Status: Open [name] Of Treatment: 2 Comorbid Chronic Obstructive Pulmonary Disease (COPD), Peripheral Clustered Wound: No History: Arterial Disease Photos Wound Measurements Length: (cm) 5 Width: (cm) 2. ESTANISLADO, SURGEON [name] ([id number]) Depth: (cm) Area: (cm) Volume: (cm) % Reduction in Area: -0.2% 5 % Reduction in Volume: 42.8% [id number] Page 6 of 6 0.4 Epithelialization: Small (1-33%) 9.817 Tunneling: No 3.927 Undermining: No Wound Description Classification: Full Thickness With Exposed Support Structures Exudate Amount: Medium Exudate Type:  Serosanguineous Exudate Color: red, brown Foul Odor After Cleansing: No Slough/Fibrino Yes Wound Bed Granulation Amount: Small (1-33%) Exposed Structure Granulation Quality: Red, Friable Fascia Exposed: No Necrotic Amount: Small (1-33%) Fat Layer (Subcutaneous Tissue) Exposed: Yes Necrotic Quality: Bone Tendon Exposed: No Muscle Exposed: No Joint Exposed: No Bone Exposed: Yes Electronic Signature(s) Signed: 01/27/2022 1:14:49 PM By: Theodore Coria RN Entered By: Theodore [name] on 01/27/2022 08:55:30 -------------------------------------------------------------------------------- [place] Details Patient Name: Date of Service: Theodore [name], [name]. 01/27/2022 8:45 [name] Medical Record Number: [id number] Patient Account Number: [id number] Date of Birth/Sex: Treating RN: Jul 12, 1929 (88 y.o. Theodore [name]) Theodore [name] Primary Care Donavin Audino: Theodore [name] Other Clinician: Referring Suleima Ohlendorf: Treating Harper Smoker/Extender: Theodore [name] in Treatment: 2 Vital Signs Time Taken: 08:47 Temperature (F): 97.9 Height (in): 75 Pulse (bpm): 83 Weight (lbs): 168 Respiratory Rate (breaths/min): 18 Body Mass Index (BMI): 21 Blood Pressure (mmHg): 127/73 Reference Range: 80 - 120 mg / dl Electronic Signature(s) Signed: 01/27/2022 1:14:49 PM By: Theodore Coria RN Entered By: Theodore [name] on 01/27/2022 08:47:39

## 2022-01-28 ENCOUNTER — Other Ambulatory Visit: Payer: Self-pay | Admitting: Internal Medicine

## 2022-01-31 ENCOUNTER — Encounter: Payer: PPO | Admitting: Speech Pathology

## 2022-01-31 DIAGNOSIS — M9902 Segmental and somatic dysfunction of thoracic region: Secondary | ICD-10-CM | POA: Diagnosis not present

## 2022-01-31 DIAGNOSIS — M9901 Segmental and somatic dysfunction of cervical region: Secondary | ICD-10-CM | POA: Diagnosis not present

## 2022-01-31 DIAGNOSIS — M9903 Segmental and somatic dysfunction of lumbar region: Secondary | ICD-10-CM | POA: Diagnosis not present

## 2022-01-31 DIAGNOSIS — M9904 Segmental and somatic dysfunction of sacral region: Secondary | ICD-10-CM | POA: Diagnosis not present

## 2022-01-31 LAB — SURGICAL PATHOLOGY

## 2022-02-01 ENCOUNTER — Ambulatory Visit (INDEPENDENT_AMBULATORY_CARE_PROVIDER_SITE_OTHER): Payer: PPO | Admitting: Internal Medicine

## 2022-02-01 ENCOUNTER — Encounter: Payer: Self-pay | Admitting: Internal Medicine

## 2022-02-01 ENCOUNTER — Ambulatory Visit: Payer: PPO | Admitting: Internal Medicine

## 2022-02-01 VITALS — BP 134/62 | HR 67 | Temp 97.5°F | Ht 74.0 in | Wt 176.2 lb

## 2022-02-01 DIAGNOSIS — R7989 Other specified abnormal findings of blood chemistry: Secondary | ICD-10-CM

## 2022-02-01 DIAGNOSIS — R471 Dysarthria and anarthria: Secondary | ICD-10-CM

## 2022-02-01 DIAGNOSIS — G3184 Mild cognitive impairment, so stated: Secondary | ICD-10-CM | POA: Diagnosis not present

## 2022-02-01 DIAGNOSIS — F0283 Dementia in other diseases classified elsewhere, unspecified severity, with mood disturbance: Secondary | ICD-10-CM

## 2022-02-01 DIAGNOSIS — J449 Chronic obstructive pulmonary disease, unspecified: Secondary | ICD-10-CM | POA: Diagnosis not present

## 2022-02-01 DIAGNOSIS — I739 Peripheral vascular disease, unspecified: Secondary | ICD-10-CM | POA: Diagnosis not present

## 2022-02-01 DIAGNOSIS — G309 Alzheimer's disease, unspecified: Secondary | ICD-10-CM | POA: Diagnosis not present

## 2022-02-01 DIAGNOSIS — R4781 Slurred speech: Secondary | ICD-10-CM

## 2022-02-01 DIAGNOSIS — K117 Disturbances of salivary secretion: Secondary | ICD-10-CM | POA: Diagnosis not present

## 2022-02-01 DIAGNOSIS — M6281 Muscle weakness (generalized): Secondary | ICD-10-CM

## 2022-02-01 LAB — AEROBIC CULTURE W GRAM STAIN (SUPERFICIAL SPECIMEN)
Culture: NO GROWTH
Gram Stain: NONE SEEN

## 2022-02-01 NOTE — Progress Notes (Unsigned)
Subjective:  Patient ID: Theodore [name], male    DOB: 11-Nov-1929  Age: 88 y.o. MRN: [id number]  CC: There were no encounter diagnoses.   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   He feels generally better.  But got "overworked " by PT this morning doing balance exercises,  using the NuStep .  Averaging 3 times /week and OT 2/week.  All safety measures are in place kn the home including grab bars, but not using his walker as he tends to leave it behind cue to forgetfulness.    Excessive salivation.  Discussed scopalamine  Trouble with memory,  bothered by his inability to remember names    Taking zetia and crestor  Taking omeprazole by Kathyrn Sheriff since last visit ( no records available )  Sept 2023   Outpatient Medications Prior to Visit  Medication Sig Dispense Refill   ARIPiprazole (ABILIFY) 2 MG tablet TAKE 1 TABLET BY MOUTH EVERY DAY 90 tablet 1   ascorbic acid (VITAMIN C) 500 MG tablet Take 500 mg by mouth daily.     buPROPion (WELLBUTRIN SR) 150 MG 12 hr tablet Take 150 mg by mouth daily.     chlorhexidine (HIBICLENS) 4 % external liquid Apply 1 Application topically as directed. Apply to Right lower leg topically every day shift for use to clean and flush wound for 7 Days     Cholecalciferol (VITAMIN D3) 50 MCG (2000 UT) TABS Take 1 tablet by mouth daily.     cycloSPORINE (RESTASIS) 0.05 % ophthalmic emulsion Place 1 drop into both eyes 2 (two) times daily.     ezetimibe (ZETIA) 10 MG tablet TAKE 1 TABLET BY MOUTH EVERY DAY 60 tablet 0   levothyroxine  (SYNTHROID) 100 MCG tablet TAKE 1 TABLET BY MOUTH EVERY DAY BEFORE BREAKFAST 90 tablet 0   losartan (COZAAR) 25 MG tablet Take 1 tablet (25 mg total) by mouth daily. 90 tablet    magnesium oxide (MAG-OX) 400 MG tablet Take 400 mg by mouth daily.     meloxicam (MOBIC) 7.5 MG tablet Take 1 tablet (7.5 mg total) by mouth daily. 30 tablet 0   rosuvastatin (CRESTOR) 10 MG tablet Take 1 tablet (10 mg total) by mouth daily. 90 tablet 1   Saccharomyces boulardii (PROBIOTIC) 250 MG CAPS Take 1 capsule by mouth daily at 12 noon.     testosterone cypionate (DEPOTESTOSTERONE CYPIONATE) 200 MG/ML injection Inject 1 mL (200 mg total) into the muscle every 14 (fourteen) days. 1 mL 2   vitamin B-12 (CYANOCOBALAMIN) 500 MCG tablet Take 1 tablet by mouth daily.     mirtazapine (REMERON) 15 MG tablet Take 1 tablet (15 mg total) by mouth at bedtime. (Patient not taking: Reported on 02/01/2022)     tiZANidine (ZANAFLEX) 2 MG tablet Take by mouth every 6 (six) hours as needed for muscle spasms.     Witch Hazel (PREPARATION H SOOTHING RELIEF EX) Apply topically. Apply to Rectal area topically every 24 hours as needed for discomfort     No facility-administered medications prior to visit.  Review of Systems;  Patient denies headache, fevers, malaise, unintentional weight loss, skin rash, eye pain, sinus congestion and sinus pain, sore throat, dysphagia,  hemoptysis , cough, dyspnea, wheezing, chest pain, palpitations, orthopnea, edema, abdominal pain, nausea, melena, diarrhea, constipation, flank pain, dysuria, hematuria, urinary  Frequency, nocturia, numbness, tingling, seizures,  Focal weakness, Loss of consciousness,  Tremor, insomnia, depression, anxiety, and suicidal ideation.      Objective:  BP 134/62 (BP Location: Left Arm, Patient Position: Sitting, Cuff Size: Normal)   Pulse 67   Temp (!) 97.5 F (36.4 C) (Oral)   Ht '6\' 2"'$  (1.88 m)   Wt 176 lb 3.2 oz (79.9 kg)   SpO2 98%   BMI 22.62 kg/m   BP  Readings from Last 3 Encounters:  02/01/22 134/62  01/25/22 126/72  01/19/22 121/69    Wt Readings from Last 3 Encounters:  02/01/22 176 lb 3.2 oz (79.9 kg)  01/25/22 168 lb (76.2 kg)  01/19/22 173 lb (78.5 kg)    General appearance: alert, cooperative and appears stated age Ears: normal TM's and external ear canals both ears Throat: lips, mucosa, and tongue normal; teeth and gums normal Neck: no adenopathy, no carotid bruit, supple, symmetrical, trachea midline and thyroid not enlarged, symmetric, no tenderness/mass/nodules Back: symmetric, no curvature. ROM normal. No CVA tenderness. Lungs: clear to auscultation bilaterally Heart: regular rate and rhythm, S1, S2 normal, no murmur, click, rub or gallop Abdomen: soft, non-tender; bowel sounds normal; no masses,  no organomegaly Pulses: 2+ and symmetric Skin: Skin color, texture, turgor normal. No rashes or lesions Lymph nodes: Cervical, supraclavicular, and axillary nodes normal. Neuro:  awake and interactive with normal mood and affect. Higher cortical functions are normal. Speech is clear without word-finding difficulty or dysarthria. Extraocular movements are intact. Visual fields of both eyes are grossly intact. Sensation to light touch is grossly intact bilaterally of upper and lower extremities. Motor examination shows 4+/5 symmetric hand grip and upper extremity and 5/5 lower extremity strength. There is no pronation or drift. Gait is non-ataxic   Lab Results  Component Value Date   HGBA1C 4.8 01/17/2021   HGBA1C 4.7 05/28/2018    Lab Results  Component Value Date   CREATININE 1.19 01/23/2022   CREATININE 1.41 01/04/2022   CREATININE 1.14 12/05/2021    Lab Results  Component Value Date   WBC 6.6 01/04/2022   HGB 12.9 (L) 01/04/2022   HCT 38.5 (L) 01/04/2022   PLT 275.0 01/04/2022   GLUCOSE 106 (H) 01/23/2022   CHOL 197 09/21/2020   TRIG 70.0 09/21/2020   HDL 62.00 09/21/2020   LDLCALC 121 (H) 09/21/2020   ALT  17 01/04/2022   AST 19 01/04/2022   NA 137 01/23/2022   K 4.1 01/23/2022   CL 103 01/23/2022   CREATININE 1.19 01/23/2022   BUN 28 (H) 01/23/2022   CO2 28 01/23/2022   TSH 0.97 08/26/2021   PSA 21.68 (H) 02/11/2021   INR 1.4 (H) 11/29/2021   HGBA1C 4.8 01/17/2021    No results found.  Assessment & Plan:   Problem List Items Addressed This Visit   None   I spent a total of   minutes with this patient in a face to face visit on the date of this encounter reviewing the last office visit with me in       ,  most recent visit with cardiology ,    ,  patient's diet and exercise habits, home blood pressure /blod sugar readings, recent ER  visit including labs and imaging studies ,   and post visit ordering of testing and therapeutics.    Follow-up: No follow-ups on file.   [name], MD

## 2022-02-01 NOTE — Patient Instructions (Addendum)
Taper off of abilify as follows:   1/2 tablet daily  for 3 days,  then 1/2 tablet every other day until gone   If agitation continues,  we can add a different medication depending on follow up in one month   We can try scopalomine patches for the excessive salivation .  This is an off label use,  usually only with Hospice patients, but please read about the side effects before you decide to try these patches     I will make a referral to speecjh therapy as well  You can stop the omeprazole since it is not helping your swallowing  issues

## 2022-02-02 ENCOUNTER — Encounter: Payer: Self-pay | Admitting: Internal Medicine

## 2022-02-02 DIAGNOSIS — K117 Disturbances of salivary secretion: Secondary | ICD-10-CM | POA: Insufficient documentation

## 2022-02-02 NOTE — Assessment & Plan Note (Signed)
Advised to continue Zetia and Crestor .  ABI's are improving

## 2022-02-02 NOTE — Assessment & Plan Note (Signed)
Today and I am discontinuing abilify with a 2 week taper due to increased symptoms of anxiety noticed by Keshon's wife.  He will continue wellbutrin and follow up in 4 weeks to address any persistent agitation/anxiety

## 2022-02-02 NOTE — Assessment & Plan Note (Signed)
He continues to benefit from use of testosterone for QOL issues and is aware that his presumed prostate CA may be accelerated by use of exogenous testosterone. However given his age and his recent decline in PSA values,  he prefers to continue testosterone.

## 2022-02-02 NOTE — Assessment & Plan Note (Signed)
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  Referring back to speech therapy

## 2022-02-02 NOTE — Assessment & Plan Note (Signed)
He reports improvement in his dyspnea and is asymptomatic with activities

## 2022-02-02 NOTE — Assessment & Plan Note (Signed)
His hypersalivation is leading to recurrent aspiration and cough.  Discussed the off label use of scopalamine which is not preferable due to side effects.  Referring to speech therapy

## 2022-02-02 NOTE — Assessment & Plan Note (Signed)
Secondary to advanced age, prolonged illness, and PAD.  Continue PT

## 2022-02-02 NOTE — Assessment & Plan Note (Signed)
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   recommend that Theodore [name] read  "the End of Alzheimer's: The First Program to Prevent and Reverse Cognitive Decline"  by Brent Bulla, MD

## 2022-02-05 ENCOUNTER — Other Ambulatory Visit: Payer: Self-pay | Admitting: Cardiovascular Disease

## 2022-02-07 ENCOUNTER — Encounter: Payer: Self-pay | Admitting: Internal Medicine

## 2022-02-09 DIAGNOSIS — M9903 Segmental and somatic dysfunction of lumbar region: Secondary | ICD-10-CM | POA: Diagnosis not present

## 2022-02-09 DIAGNOSIS — M9902 Segmental and somatic dysfunction of thoracic region: Secondary | ICD-10-CM | POA: Diagnosis not present

## 2022-02-09 DIAGNOSIS — M9901 Segmental and somatic dysfunction of cervical region: Secondary | ICD-10-CM | POA: Diagnosis not present

## 2022-02-09 DIAGNOSIS — M9904 Segmental and somatic dysfunction of sacral region: Secondary | ICD-10-CM | POA: Diagnosis not present

## 2022-02-10 ENCOUNTER — Encounter: Payer: Self-pay | Admitting: Internal Medicine

## 2022-02-10 ENCOUNTER — Ambulatory Visit (INDEPENDENT_AMBULATORY_CARE_PROVIDER_SITE_OTHER): Payer: PPO | Admitting: Internal Medicine

## 2022-02-10 ENCOUNTER — Encounter: Payer: PPO | Attending: Physician Assistant | Admitting: Physician Assistant

## 2022-02-10 VITALS — BP 104/62 | HR 61 | Temp 98.0°F | Ht 74.0 in | Wt 177.0 lb

## 2022-02-10 DIAGNOSIS — R2681 Unsteadiness on feet: Secondary | ICD-10-CM | POA: Diagnosis not present

## 2022-02-10 DIAGNOSIS — N1831 Chronic kidney disease, stage 3a: Secondary | ICD-10-CM | POA: Insufficient documentation

## 2022-02-10 DIAGNOSIS — B9562 Methicillin resistant Staphylococcus aureus infection as the cause of diseases classified elsewhere: Secondary | ICD-10-CM | POA: Diagnosis not present

## 2022-02-10 DIAGNOSIS — J301 Allergic rhinitis due to pollen: Secondary | ICD-10-CM | POA: Diagnosis not present

## 2022-02-10 DIAGNOSIS — F0283 Dementia in other diseases classified elsewhere, unspecified severity, with mood disturbance: Secondary | ICD-10-CM | POA: Diagnosis not present

## 2022-02-10 DIAGNOSIS — R4189 Other symptoms and signs involving cognitive functions and awareness: Secondary | ICD-10-CM | POA: Diagnosis not present

## 2022-02-10 DIAGNOSIS — R2689 Other abnormalities of gait and mobility: Secondary | ICD-10-CM | POA: Diagnosis not present

## 2022-02-10 DIAGNOSIS — R278 Other lack of coordination: Secondary | ICD-10-CM | POA: Diagnosis not present

## 2022-02-10 DIAGNOSIS — S80811A Abrasion, right lower leg, initial encounter: Secondary | ICD-10-CM | POA: Insufficient documentation

## 2022-02-10 DIAGNOSIS — L97812 Non-pressure chronic ulcer of other part of right lower leg with fat layer exposed: Secondary | ICD-10-CM | POA: Diagnosis not present

## 2022-02-10 DIAGNOSIS — W228XXA Striking against or struck by other objects, initial encounter: Secondary | ICD-10-CM | POA: Insufficient documentation

## 2022-02-10 DIAGNOSIS — J449 Chronic obstructive pulmonary disease, unspecified: Secondary | ICD-10-CM | POA: Insufficient documentation

## 2022-02-10 DIAGNOSIS — L03115 Cellulitis of right lower limb: Secondary | ICD-10-CM | POA: Diagnosis not present

## 2022-02-10 DIAGNOSIS — M6281 Muscle weakness (generalized): Secondary | ICD-10-CM | POA: Diagnosis not present

## 2022-02-10 DIAGNOSIS — I70239 Atherosclerosis of native arteries of right leg with ulceration of unspecified site: Secondary | ICD-10-CM | POA: Insufficient documentation

## 2022-02-10 DIAGNOSIS — I2510 Atherosclerotic heart disease of native coronary artery without angina pectoris: Secondary | ICD-10-CM | POA: Diagnosis not present

## 2022-02-10 DIAGNOSIS — Z741 Need for assistance with personal care: Secondary | ICD-10-CM | POA: Diagnosis not present

## 2022-02-10 DIAGNOSIS — B9561 Methicillin susceptible Staphylococcus aureus infection as the cause of diseases classified elsewhere: Secondary | ICD-10-CM | POA: Diagnosis not present

## 2022-02-10 DIAGNOSIS — G309 Alzheimer's disease, unspecified: Secondary | ICD-10-CM | POA: Diagnosis not present

## 2022-02-10 DIAGNOSIS — S81801D Unspecified open wound, right lower leg, subsequent encounter: Secondary | ICD-10-CM | POA: Diagnosis not present

## 2022-02-10 DIAGNOSIS — L97516 Non-pressure chronic ulcer of other part of right foot with bone involvement without evidence of necrosis: Secondary | ICD-10-CM | POA: Diagnosis not present

## 2022-02-10 MED ORDER — FLUTICASONE PROPIONATE 50 MCG/ACT NA SUSP: 2.0000 | Freq: Every day | NASAL | 0 refills | Status: DC

## 2022-02-10 NOTE — Progress Notes (Signed)
MARY, HOCKEY A ([id number]) [id number] Page 1 of 9 Visit Report for 02/10/2022 Arrival Information Details Patient Name: Date of Service: NASEER, HEARN 02/10/2022 10:15 A M Medical Record Number: [id number] Patient Account Number: [id number] Date of Birth/Sex: Treating RN: [birth year] (88 y.o. [name]) [name] Primary Care Itay Mella: [name] Other Clinician: Referring Zed Wanninger: Treating Marenda Accardi/Extender: [name] in Treatment: 4 Visit Information History Since Last Visit All ordered tests and consults were completed: No Patient Arrived: Gilford Rile Added or deleted any medications: No Arrival Time: 10:19 Any new allergies or adverse reactions: No Accompanied By: self Had a fall or experienced change in No Transfer Assistance: None activities of daily living that may affect Patient Identification Verified: Yes risk of falls: Secondary Verification Process Completed: Yes Signs or symptoms of abuse/neglect since last visito No Patient Requires Transmission-Based Precautions: No Hospitalized since last visit: No Patient Has Alerts: Yes Implantable device outside of the clinic excluding No Patient Alerts: ABI R .61 09/30/21 TBI .38 cellular tissue based products placed in the center ABI L .49 09/30/21 TBI .47 since last visit: Has Dressing in Place as Prescribed: Yes Has Compression in Place as Prescribed: Yes Pain Present Now: No Electronic Signature(s) Signed: 02/10/2022 12:25:43 PM By: [name] RN Entered By: [name] on 02/10/2022 10:24:59 -------------------------------------------------------------------------------- Clinic Level of Care Assessment Details Patient Name: Date of Service: TARQUIN, WELCHER 02/10/2022 10:15 A M Medical Record Number: [id number] Patient Account Number: [id number] Date of Birth/Sex: Treating RN: 25-Jun-1929 (88 y.o. [name]) [name] Primary Care Jaylene Schrom: [name] Other Clinician: Referring  [employer]: Treating Malley Hauter/Extender: [name] Weeks in Treatment: 4 Clinic Level of Care Assessment Items TOOL 4 Quantity Score X- 1 0 Use when only an EandM is performed on FOLLOW-UP visit ASSESSMENTS - Nursing Assessment / Reassessment X- 1 10 Reassessment of Co-morbidities (includes updates in patient status) Mcgroarty, Hulon A ([id number]) [id number] Page 2 of 9 X- 1 5 Reassessment of Adherence to Treatment Plan ASSESSMENTS - Wound and Skin A ssessment / Reassessment X - Simple Wound Assessment / Reassessment - one wound 1 5 '[]'$  - 0 Complex Wound Assessment / Reassessment - multiple wounds '[]'$  - 0 Dermatologic / Skin Assessment (not related to wound area) ASSESSMENTS - Focused Assessment '[]'$  - 0 Circumferential Edema Measurements - multi extremities '[]'$  - 0 Nutritional Assessment / Counseling / Intervention '[]'$  - 0 Lower Extremity Assessment (monofilament, tuning fork, pulses) '[]'$  - 0 Peripheral Arterial Disease Assessment (using hand held doppler) ASSESSMENTS - Ostomy and/or Continence Assessment and Care '[]'$  - 0 Incontinence Assessment and Management '[]'$  - 0 Ostomy Care Assessment and Management (repouching, etc.) PROCESS - Coordination of Care X - Simple Patient / Family Education for ongoing care 1 15 '[]'$  - 0 Complex (extensive) Patient / Family Education for ongoing care X- 1 10 Staff obtains Programmer, systems, Records, T Results / Process Orders est '[]'$  - 0 Staff telephones HHA, Nursing Homes / Clarify orders / etc '[]'$  - 0 Routine Transfer to another Facility (non-emergent condition) '[]'$  - 0 Routine Hospital Admission (non-emergent condition) '[]'$  - 0 New Admissions / Biomedical engineer / Ordering NPWT Apligraf, etc. , '[]'$  - 0 Emergency Hospital Admission (emergent condition) X- 1 10 Simple Discharge Coordination '[]'$  - 0 Complex (extensive) Discharge Coordination PROCESS - Special Needs '[]'$  - 0 Pediatric / Minor Patient  Management '[]'$  - 0 Isolation Patient Management '[]'$  - 0 Hearing / Language / Visual special needs '[]'$  - 0 Assessment of Community assistance (transportation, D/C planning,  etc.) '[]'$  - 0 Additional assistance / Altered mentation '[]'$  - 0 Support Surface(s) Assessment (bed, cushion, seat, etc.) INTERVENTIONS - Wound Cleansing / Measurement X - Simple Wound Cleansing - one wound 1 5 '[]'$  - 0 Complex Wound Cleansing - multiple wounds X- 1 5 Wound Imaging (photographs - any number of wounds) '[]'$  - 0 Wound Tracing (instead of photographs) X- 1 5 Simple Wound Measurement - one wound '[]'$  - 0 Complex Wound Measurement - multiple wounds INTERVENTIONS - Wound Dressings X - Small Wound Dressing one or multiple wounds 1 10 '[]'$  - 0 Medium Wound Dressing one or multiple wounds '[]'$  - 0 Large Wound Dressing one or multiple wounds '[]'$  - 0 Application of Medications - topical '[]'$  - 0 Application of Medications - injection INTERVENTIONS - Miscellaneous Heldman, Jemari A ([id number]) [id number] Page 3 of 9 '[]'$  - 0 External ear exam '[]'$  - 0 Specimen Collection (cultures, biopsies, blood, body fluids, etc.) '[]'$  - 0 Specimen(s) / Culture(s) sent or taken to Lab for analysis '[]'$  - 0 Patient Transfer (multiple staff / Harrel Lemon Lift / Similar devices) '[]'$  - 0 Simple Staple / Suture removal (25 or less) '[]'$  - 0 Complex Staple / Suture removal (26 or more) '[]'$  - 0 Hypo / Hyperglycemic Management (close monitor of Blood Glucose) '[]'$  - 0 Ankle / Brachial Index (ABI) - do not check if billed separately X- 1 5 Vital Signs Has the patient been seen at the hospital within the last three years: Yes Total Score: 85 Level Of Care: New/Established - Level 3 Electronic Signature(s) Signed: 02/10/2022 12:25:43 PM By: [name] RN Entered By: [name] on 02/10/2022 10:56:31 -------------------------------------------------------------------------------- Encounter Discharge Information  Details Patient Name: Date of Service: [name]. 02/10/2022 10:15 A M Medical Record Number: [id number] Patient Account Number: [id number] Date of Birth/Sex: Treating RN: 03-21-1929 (88 y.o. [name]) [name] Primary Care Slayton Lubitz: [name] Other Clinician: Referring Lutisha Knoche: Treating Oliana Gowens/Extender: [name] in Treatment: 4 Encounter Discharge Information Items Discharge Condition: Stable Ambulatory Status: Walker Discharge Destination: Home Transportation: Private Auto Accompanied By: wife Schedule Follow-up Appointment: Yes Clinical Summary of Care: Electronic Signature(s) Signed: 02/10/2022 12:25:43 PM By: [name] RN Entered By: [name] on 02/10/2022 10:57:39 Dusenbery, Hance A ([id number]) [id number] Page 4 of 9 -------------------------------------------------------------------------------- Lower Extremity Assessment Details Patient Name: Date of Service: ALPHA, MYSLIWIEC 02/10/2022 10:15 A M Medical Record Number: [id number] Patient Account Number: [id number] Date of Birth/Sex: Treating RN: 08/27/1929 (88 y.o. [name]) [name] Primary Care Annakate Soulier: [name] Other Clinician: Referring Mikiah Durall: Treating Louanna Vanliew/Extender: [name] Weeks in Treatment: 4 Edema Assessment Assessed: Shirlyn Goltz: No] [Right: No] Edema: [Left: Ye] [Right: s] Calf Left: Right: Point of Measurement: From Medial Instep 32 cm Ankle Left: Right: Point of Measurement: From Medial Instep 21 cm Electronic Signature(s) Signed: 02/10/2022 12:25:43 PM By: [name] RN Entered By: [name] on 02/10/2022 10:35:36 -------------------------------------------------------------------------------- Multi Wound Chart Details Patient Name: Date of Service: [name]. 02/10/2022 10:15 A M Medical Record Number: [id number] Patient Account Number: [id number] Date of Birth/Sex: Treating RN: 04-Jan-1930 (88 y.o. [name] Primary Care Deeandra Jerry: [name] Other Clinician: Referring Assyria Morreale: Treating Aubriel Khanna/Extender: [name] Weeks in Treatment: 4 Vital Signs Height(in): 75 Pulse(bpm): 65 Weight(lbs): 168 Blood Pressure(mmHg): 132/78 Body Mass Index(BMI): 21 Temperature(F): 97.7 Respiratory Rate(breaths/min): 18 [2:Photos:] [N/A:N/A] Right, Midline, Anterior Lower Leg N/A N/A Wound Location: Laceration N/A N/A Wounding Event: Atypical N/A N/A Primary Etiology: Trauma, Other N/A  N/A Secondary Etiology: Chronic Obstructive Pulmonary N/A N/A Comorbid History: Disease (COPD), Peripheral Arterial Aitken, Esvin A ([id number]) [id number] Page 5 of 9 Disease 11/24/2021 N/A N/A Date Acquired: 4 N/A N/A Weeks of Treatment: Open N/A N/A Wound Status: No N/A N/A Wound Recurrence: 4.7x1.7x0.4 N/A N/A Measurements L x W x D (cm) 6.275 N/A N/A A (cm) : rea 2.51 N/A N/A Volume (cm) : 36.00% N/A N/A % Reduction in Area: 63.40% N/A N/A % Reduction in Volume: Full Thickness With Exposed Support N/A N/A Classification: Structures Medium N/A N/A Exudate Amount: Serosanguineous N/A N/A Exudate Type: red, brown N/A N/A Exudate Color: Small (1-33%) N/A N/A Granulation Amount: Red, Hyper-granulation N/A N/A Granulation Quality: Small (1-33%) N/A N/A Necrotic Amount: Fat Layer (Subcutaneous Tissue): Yes N/A N/A Exposed Structures: Bone: Yes Fascia: No Tendon: No Muscle: No Joint: No Small (1-33%) N/A N/A Epithelialization: Treatment Notes Electronic Signature(s) Signed: 02/10/2022 12:25:43 PM By: [name] RN Entered By: [name] on 02/10/2022 10:35:42 -------------------------------------------------------------------------------- [place] Details Patient Name: Date of Service: [name]. 02/10/2022 10:15 A M Medical Record Number: [id number] Patient Account Number: [id number] Date of  Birth/Sex: Treating RN: 06-16-1929 (88 y.o. [name] Primary Care Autymn Omlor: [name] Other Clinician: Referring Tessi Eustache: Treating Hasson Gaspard/Extender: [name] Weeks in Treatment: 4 Active Inactive Necrotic Tissue Nursing Diagnoses: Impaired tissue integrity related to necrotic/devitalized tissue Knowledge deficit related to management of necrotic/devitalized tissue Goals: Necrotic/devitalized tissue will be minimized in the wound bed Date Initiated: 01/20/2022 Target Resolution Date: 02/19/2022 Goal Status: Active Patient/caregiver will verbalize understanding of reason and process for debridement of necrotic tissue Date Initiated: 01/20/2022 Target Resolution Date: 02/19/2022 Goal Status: Active Interventions: Assess patient pain level pre-, during and post procedure and prior to discharge Provide education on necrotic tissue and debridement process Treatment Activities: ASHLEE, BEWLEY ([id number]) [id number] Page 6 of 9 Apply topical anesthetic as ordered : 01/20/2022 Excisional debridement : 01/20/2022 Notes: Wound/Skin Impairment Nursing Diagnoses: Impaired tissue integrity Knowledge deficit related to smoking impact on wound healing Knowledge deficit related to ulceration/compromised skin integrity Goals: Patient will demonstrate a reduced rate of smoking or cessation of smoking Date Initiated: 01/13/2022 Target Resolution Date: 02/12/2022 Goal Status: Active Patient will have a decrease in wound volume by X% from date: (specify in notes) Date Initiated: 01/13/2022 Target Resolution Date: 02/12/2022 Goal Status: Active Patient/caregiver will verbalize understanding of skin care regimen Date Initiated: 01/13/2022 Target Resolution Date: 02/12/2022 Goal Status: Active Ulcer/skin breakdown will have a volume reduction of 30% by week 4 Date Initiated: 01/13/2022 Target Resolution Date: 02/12/2022 Goal Status:  Active Ulcer/skin breakdown will have a volume reduction of 50% by week 8 Date Initiated: 01/13/2022 Target Resolution Date: 03/15/2022 Goal Status: Active Ulcer/skin breakdown will have a volume reduction of 80% by week 12 Date Initiated: 01/13/2022 Target Resolution Date: 04/15/2022 Goal Status: Active Ulcer/skin breakdown will heal within 14 weeks Date Initiated: 01/13/2022 Target Resolution Date: 05/14/2022 Goal Status: Active Interventions: Assess patient/caregiver ability to obtain necessary supplies Assess patient/caregiver ability to perform ulcer/skin care regimen upon admission and as needed Assess ulceration(s) every visit Provide education on ulcer and skin care Screen for HBO Treatment Activities: Skin care regimen initiated : 01/13/2022 Topical wound management initiated : 01/13/2022 Notes: Electronic Signature(s) Signed: 02/10/2022 12:25:43 PM By: [name] RN Entered By: [name] on 02/10/2022 10:56:56 -------------------------------------------------------------------------------- Pain Assessment Details Patient Name: Date of Service: ROBERTT, BUDA 02/10/2022 10:15 A M Medical Record Number: [id number] Patient Account Number: [id number] Date  of Birth/Sex: Treating RN: July 08, 1929 (88 y.o. [name] Primary Care Tarryn Bogdan: [name] Other Clinician: Referring Darnell Stimson: Treating Nolia Tschantz/Extender: [name] in Treatment: Kipton, Jamas A ([id number]) [id number] Page 7 of 9 Active Problems Location of Pain Severity and Description of Pain Patient Has Paino No Site Locations Pain Management and Medication Current Pain Management: Electronic Signature(s) Signed: 02/10/2022 12:25:43 PM By: [name] RN Entered By: [name] on 02/10/2022 10:25:38 -------------------------------------------------------------------------------- Patient/Caregiver Education Details Patient Name: Date of Service: [name] 12/1/2023andnbsp10:15 [place] Record Number: [id number] Patient Account Number: [id number] Date of Birth/Gender: Treating RN: [birth year] (88 y.o. [name] Primary Care Physician: [name] Other Clinician: Referring Physician: Treating Physician/Extender: [name] in Treatment: 4 Education Assessment Education Provided To: Patient Education Topics Provided Wound/Skin Impairment: Methods: Explain/Verbal Responses: State content correctly Electronic Signature(s) Signed: 02/10/2022 12:25:43 PM By: [name] RN Entered By: [name] on 02/10/2022 10:56:47 Kirks, Jais A ([id number]) [id number] Page 8 of 9 -------------------------------------------------------------------------------- Wound Assessment Details Patient Name: Date of Service: KANI, CHAUVIN 02/10/2022 10:15 A M Medical Record Number: [id number] Patient Account Number: [id number] Date of Birth/Sex: Treating RN: 11-Jun-1929 (88 y.o. [name]) [name] Primary Care Mayelin Panos: [name] Other Clinician: Referring Shakora Nordquist: Treating Joetta Delprado/Extender: [name] Weeks in Treatment: 4 Wound Status Wound Number: 2 Primary Atypical Etiology: Wound Location: Right, Midline, Anterior Lower Leg Secondary Trauma, Other Wounding Event: Laceration Etiology: Date Acquired: 11/24/2021 Wound Status: Open Weeks Of Treatment: 4 Comorbid Chronic Obstructive Pulmonary Disease (COPD), Peripheral Clustered Wound: No History: Arterial Disease Photos Wound Measurements Length: (cm) 4.7 Width: (cm) 1.7 Depth: (cm) 0.4 Area: (cm) 6.275 Volume: (cm) 2.51 % Reduction in Area: 36% % Reduction in Volume: 63.4% Epithelialization: Small (1-33%) Tunneling: No Undermining: No Wound Description Classification: Full Thickness With Exposed Suppo Exudate Amount: Medium Exudate Type: Serosanguineous Exudate Color: red, brown rt Structures Foul  Odor After Cleansing: No Slough/Fibrino Yes Wound Bed Granulation Amount: Small (1-33%) Exposed Structure Granulation Quality: Red, Hyper-granulation Fascia Exposed: No Necrotic Amount: Small (1-33%) Fat Layer (Subcutaneous Tissue) Exposed: Yes Necrotic Quality: Adherent Slough, Bone Tendon Exposed: No Muscle Exposed: No Joint Exposed: No Bone Exposed: Yes Treatment Notes Wound #2 (Lower Leg) Wound Laterality: Right, Midline, Anterior Cleanser Soap and Water Discharge Instruction: Gently cleanse wound with antibacterial soap, rinse and pat dry prior to dressing wounds Wager, Whitley A ([id number]) [id number] Page 9 of 9 Peri-Wound Care Topical Primary Dressing Prisma 4.34 (in) Discharge Instruction: Moisten w/normal saline or sterile water; Cover wound as directed. Do not remove from wound bed. Secondary Dressing Xeroform Petrolatum Gauze, Overwrap 1x8 (in/in) Secured With [employer] or Non-Sterile 6-ply 4.5x4 (yd/yd) Discharge Instruction: Apply Kerlix as directed Tubigrip Size D, 3x10 (in/yd) Discharge Instruction: single layer Compression Wrap Compression Stockings Add-Ons Electronic Signature(s) Signed: 02/10/2022 12:25:43 PM By: [name] RN Entered By: [name] on 02/10/2022 10:34:33 -------------------------------------------------------------------------------- Vitals Details Patient Name: Date of Service: Ferran, [name] 02/10/2022 10:15 A M Medical Record Number: [id number] Patient Account Number: [id number] Date of Birth/Sex: Treating RN: 06-28-1929 (88 y.o. [name]) [name] Primary Care Carlon Davidson: [name] Other Clinician: Referring Ranvir Renovato: Treating Milbert Bixler/Extender: [name] Weeks in Treatment: 4 Vital Signs Time Taken: 10:25 Temperature (F): 97.7 Height (in): 75 Pulse (bpm): 72 Weight (lbs): 168 Respiratory Rate (breaths/min): 18 Body Mass Index (BMI): 21 Blood Pressure (mmHg):  132/78 Reference Range: 80 - 120 mg / dl Electronic Signature(s) Signed: 02/10/2022 12:25:43  PM By: [name] RN Entered By: [name] on 02/10/2022 10:25:22

## 2022-02-10 NOTE — Patient Instructions (Addendum)
Try Flonase for nasal congestion Use as directed  Avoid secondhand smoke Avoid sick contacts Follow-up with neurology as needed Follow-up with speech therapy

## 2022-02-10 NOTE — Progress Notes (Addendum)
TAG, WURTZ A ([id number]) [id number] Page 1 of 7 Visit Report for 02/10/2022 Chief Complaint Document Details Patient Name: Date of Service: Theodore [name], Theodore [name] 02/10/2022 10:15 A M Medical Record Number: [id number] Patient Account Number: [id number] Date of Birth/Sex: Treating RN: 15-Jul-1929 (88 y.o. [name]) [name] Primary Care Provider: [name] Other Clinician: Referring Provider: Treating Provider/Extender: [name] Weeks in Treatment: 4 Information Obtained from: Patient Chief Complaint Right LE Ulcer Electronic Signature(s) Signed: 02/10/2022 10:36:49 AM By: Worthy Keeler PA-C Entered By: Worthy Keeler on 02/10/2022 10:36:49 -------------------------------------------------------------------------------- HPI Details Patient Name: Date of Service: Koska, [name] 02/10/2022 10:15 A M Medical Record Number: [id number] Patient Account Number: [id number] Date of Birth/Sex: Treating RN: 1929-10-12 (88 y.o. [name] Primary Care Provider: [name] Other Clinician: Referring Provider: Treating Provider/Extender: [name] Weeks in Treatment: 4 History of Present Illness HPI Description: ADMISSION 10/03/2021 This is a 87 year old man who was pulling a cart 3 months ago he had some sort of imbalance issue hitting his left anterior lower leg. He is not sure exactly what he hit. He is able to show me photos on his phone that show a laceration on the left anterior lower leg in an inverted L-shaped and. He initially did very little to this himself. Saw his primary doctor on 09/21/2021 [place] instructed him to put Vaseline gauze on this which he did for short period of time. He says that things are getting a lot smaller and are improving he has not really been treating this with anything. The other major problem the patient has is peripheral arterial disease. He is followed by vein and vascular and has had a  series of follow-up arterial studies. Most recently this was done on 09/30/2021 showing on the left and ABI of 0.49 with a great toe pressure of 0.47 and monophasic waveforms. This is somewhat worse than test he had done on 03/21/2021 at which time the ABI was 0.68 TBI of 0.51. I think because of his age and the fact he has underlying stage IIIa chronic kidney disease and the fact the wound is healing they have not been scheduling him for anything aggressive but follow-up studies are apparently booked for 23-month Past medical history includes coronary artery disease, lumbar radiculopathy, COPD which is limiting and stage IIIa chronic renal failure. He has PAD as noted. He also tells me that he had a surgical procedure for essential tremor which left him with some mild dysarthria and gait and balance problems SLUKASZ, ROGUSA ([id number] Page 2 of 7 10-10-2021 upon evaluation today patient appears to be doing decently well in regard to his wound all things considered. He is very slow to heal due to his low ABIs. With that being said it does appear that he is making some progress nonetheless there is little bit of dry skin and tissue around the edges of the wound I think a very light debridement to clear this away could be of benefit for him. The patient is in agreement with that plan. For that reason I did actually perform debridement to clear that away today but did so extremely carefully. 10-17-2021 upon evaluation today patient appears to be doing well currently in regard to his wound in fact this appears to be completely healed based on what I am seeing today. I do not see any evidence of active infection locally or systemically which is great news. No fevers, chills, nausea, vomiting, or diarrhea. Readmission: 01-13-2022 patient  presents today for follow-up evaluation although the last time I saw him was actually August of this year. At that time he actually was  doing decently well with regard to the wound on the left leg. Unfortunately however it was noted that he had noted poor ABIs and again I somehow missed documented the right side at the previous admission but his right ABI was 0.61 with a TBI of 0.38. Based on what I am seeing today and considering his arterial flow which is not very good I do believe that he likely needs to be seen by vascular soon as possible I think that is contributing to his poor healing in regard to this right leg. I discussed that with him today and the individual who I am assuming to be a family member that was present with him as well although I did not inquire in order to confirm. 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 He subsequently had an injury where he sustained a laceration this was on November 24, 2021. He then ended up with a need for reopening this with incision and drainage on 11-29-2021. I did look through the pictures that his family member had as well from the beginning through now and there has been some improvement though a lot of the granulation tissue is somewhat hypergranular there is also some evidence that the bone is drying out of the central portion of the wound. Unfortunately I do not think that this is healing nearly as well as I would like to see. I believe his blood flow is affecting this significantly and I think that needs to be addressed ASAP. Otherwise the patient's past medical history really has not changed significantly since the last time I saw him. That can be referenced above. 11/10; second visit for this wound on the right anterior upper tibia. As I understand things he initially had a fall while putting something in his attic. Initially the wound was sutured but became secondarily infected and had to be opened by general surgery culturing MSSA. He was followed by Dr. Steva Ready of infectious disease and is completed antibiotics [Keflex]. He has completed  this. Currently we are using Xeroform with backing Dakin's changing this daily. He has formal arterial studies ordered for vein and vascular next Wednesday apparently in response to a ABI of 0.6 Patient is asking about Medihoney which they apparently have in the [facility] clinic. I cannot pick up any claudication by history. 01-27-2022 upon evaluation today patient's wound is doing a little bit better and overall appearance fortunately there does not appear to be any signs of infection locally or systemically at this time which is great news and overall I am extremely pleased with where we stand. No fevers, chills, nausea, vomiting, or diarrhea. He did have his arterial study which I did review today. He is ABI on the right was 0.84 with a TBI of 0.97 on the left was 0.62 with a TBI of 0.75. This actually indicates fairly good blood flow all things considered. Fortunately I do not see any evidence of significant infection at this point as well which is also good news. 02-10-2022 upon evaluation today patient appears to be doing well currently in regard to his wound. He is actually been tolerating the dressing changes without complication. Fortunately I do not see any signs of infection which is great news. With that being said I see a lot of new granulation tissue and I am actually extremely pleased with where  we stand compared to last time I saw him I think that there is a lot of new growth over top of the bone area which is excellent news that is exactly what we wanted to see. I think there is probably only about 30% exposure of the bone compared to last time I saw him. Electronic Signature(s) Signed: 02/10/2022 11:03:04 AM By: Worthy Keeler PA-C Entered By: Worthy Keeler on 02/10/2022 11:03:04 -------------------------------------------------------------------------------- Physical Exam Details Patient Name: Date of Service: Souffrant, [name] 02/10/2022 10:15 A M Medical Record Number:  [id number] Patient Account Number: [id number] Date of Birth/Sex: Treating RN: [birth year] (88 y.o. [name] Primary Care Provider: [name] Other Clinician: Referring Provider: Treating Provider/Extender: [name] Weeks in Treatment: 4 Constitutional Well-nourished and well-hydrated in no acute distress. Respiratory normal breathing without difficulty. Psychiatric this patient is able to make decisions and demonstrates good insight into disease process. Alert and Oriented x 3. pleasant and cooperative. Notes Upon inspection patient's wound bed actually showed signs of good granulation epithelization at this point. Fortunately I do not see any signs of active infection locally or systemically which is great news and overall I am extremely pleased with where we stand currently. Electronic Signature(s) JAMANI, BEARCE A ([id number]) [id number] Page 3 of 7 Signed: 02/10/2022 11:03:19 AM By: Worthy Keeler PA-C Entered By: Worthy Keeler on 02/10/2022 11:03:19 -------------------------------------------------------------------------------- Physician Orders Details Patient Name: Date of Service: Mersereau, [name] 02/10/2022 10:15 A M Medical Record Number: [id number] Patient Account Number: [id number] Date of Birth/Sex: Treating RN: 1929/04/09 (88 y.o. [name] Primary Care Provider: [name] Other Clinician: Referring Provider: Treating Provider/Extender: [name] in Treatment: 4 Verbal / Phone Orders: No Diagnosis Coding Follow-up Appointments ppointment in 1 week. - patient seen at [facility] Fax [phone] Return A Bathing/ Shower/ Hygiene Wash wounds with antibacterial soap and water. May shower; gently cleanse wound with antibacterial soap, rinse and pat dry prior to dressing wounds Anesthetic (Use 'Patient Medications' Section for Anesthetic Order Entry) Lidocaine applied to wound  bed Edema Control - Lymphedema / Segmental Compressive Device / Other Elevate, Exercise Daily and A void Standing for Long Periods of Time. Elevate legs to the level of the heart and pump ankles as often as possible Elevate leg(s) parallel to the floor when sitting. Wound Treatment Wound #2 - Lower Leg Wound Laterality: Right, Midline, Anterior Cleanser: Soap and Water Every Other Day/30 Days Discharge Instructions: Gently cleanse wound with antibacterial soap, rinse and pat dry prior to dressing wounds Prim Dressing: Prisma 4.34 (in) Every Other Day/30 Days ary Discharge Instructions: Moisten w/normal saline or sterile water; Cover wound as directed. Do not remove from wound bed. Secondary Dressing: Xeroform Petrolatum Gauze, Overwrap 1x8 (in/in) Every Other Day/30 Days Secured With: Kerlix Roll Sterile or Non-Sterile 6-ply 4.5x4 (yd/yd) Every Other Day/30 Days Discharge Instructions: Apply Kerlix as directed Secured With: Tubigrip Size D, 3x10 (in/yd) Every Other Day/30 Days Discharge Instructions: single layer Electronic Signature(s) Signed: 02/10/2022 12:25:43 PM By: [name] RN Signed: 02/10/2022 12:28:23 PM By: Worthy Keeler PA-C Entered By: [name] on 02/10/2022 El Jebel, Sasan A ([id number]) [id number] Page 4 of 7 -------------------------------------------------------------------------------- Problem List Details Patient Name: Date of Service: DELTA, DESHMUKH 02/10/2022 10:15 A M Medical Record Number: [id number] Patient Account Number: [id number] Date of Birth/Sex: Treating RN: 13-Dec-1929 (88 y.o. 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 02/10/2022 10:15 A M Medical Record Number: [id number] Patient Account Number: [id number] Date of Birth/Sex: Treating RN: 1929/09/03 (88 y.o. [name] Primary Care Provider: [name] Other Clinician: Referring Provider: Treating Provider/Extender: [name] Weeks in Treatment: 4 Subjective Chief Complaint ANTUAN, LIMES A ([id number]) [id number] Page 5 of 7 Information obtained from Patient Right LE Ulcer History of Present Illness (HPI) ADMISSION 10/03/2021 This is a 88 year old man who was pulling a cart 3 months ago he had some sort of imbalance issue hitting his left anterior lower leg. He is not sure exactly what he hit. He is able to show me photos on his phone that show a laceration on the left anterior lower leg in an inverted L-shaped and. He initially did very little to this himself. Saw his primary doctor on 09/21/2021 [place] instructed him to put Vaseline gauze on this which he did for short period of time. He says that things are getting a lot smaller and are improving he has not really been treating this with anything. The other major problem the patient has is peripheral arterial disease. He is followed by vein and vascular and has had a series of follow-up arterial studies. Most recently this was done on 09/30/2021 showing on the left and ABI of 0.49 with a great toe pressure of 0.47 and monophasic waveforms. This is  somewhat worse than test he had done on 03/21/2021 at which time the ABI was 0.68 TBI of 0.51. I think because of his age and the fact he has underlying stage IIIa chronic kidney disease and the fact the wound is healing they have not been scheduling him for anything aggressive but follow-up studies are apparently booked for 66-month Past medical history includes coronary artery disease, lumbar radiculopathy, COPD which is limiting and stage IIIa chronic renal failure. He has PAD as noted. He also tells me that he had a surgical procedure for essential tremor which left him with some mild dysarthria and gait and balance problems 10-10-2021 upon evaluation today patient appears to be doing decently well in regard to his wound all things considered. He is very slow to heal due to his low ABIs. With that being said it does appear that he is making some progress nonetheless there is little bit of dry skin and tissue around the edges of the wound I think a very light debridement to clear this away could be of benefit for him. The patient is in agreement with that plan. For that reason I did actually perform debridement to clear that away today but did so extremely carefully. 10-17-2021 upon evaluation today patient appears to be doing well currently in regard to his wound in fact this appears to be completely healed based on what I am seeing today. I do not see any evidence of active infection locally or systemically which is great news. No fevers, chills, nausea, vomiting, or diarrhea. Readmission: 01-13-2022 patient presents today for  follow-up evaluation although the last time I saw him was actually August of this year. At that time he actually was doing decently well with regard to the wound on the left leg. Unfortunately however it was noted that he had noted poor ABIs and again I somehow missed documented the right side at the previous admission but his right ABI was 0.61 with a TBI of 0.38. Based on what  I am seeing today and considering his arterial flow which is not very good I do believe that he likely needs to be seen by vascular soon as possible I think that is contributing to his poor healing in regard to this right leg. I discussed that with him today and the individual who I am assuming to be a family member that was present with him as well although I did not inquire in order to confirm. 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 He subsequently had an injury where he sustained a laceration this was on November 24, 2021. He then ended up with a need for reopening this with incision and drainage on 11-29-2021. I did look through the pictures that his family member had as well from the beginning through now and there has been some improvement though a lot of the granulation tissue is somewhat hypergranular there is also some evidence that the bone is drying out of the central portion of the wound. Unfortunately I do not think that this is healing nearly as well as I would like to see. I believe his blood flow is affecting this significantly and I think that needs to be addressed ASAP. Otherwise the patient's past medical history really has not changed significantly since the last time I saw him. That can be referenced above. 11/10; second visit for this wound on the right anterior upper tibia. As I understand things he initially had a fall while putting something in his attic. Initially the wound was sutured but became secondarily infected and had to be opened by general surgery culturing MSSA. He was followed by Dr. Steva Ready of infectious disease and is completed antibiotics [Keflex]. He has completed this. Currently we are using Xeroform with backing Dakin's changing this daily. He has formal arterial studies ordered for vein and vascular next Wednesday apparently in response to a ABI of 0.6 Patient is asking about Medihoney which they apparently have in the  [facility] clinic. I cannot pick up any claudication by history. 01-27-2022 upon evaluation today patient's wound is doing a little bit better and overall appearance fortunately there does not appear to be any signs of infection locally or systemically at this time which is great news and overall I am extremely pleased with where we stand. No fevers, chills, nausea, vomiting, or diarrhea. He did have his arterial study which I did review today. He is ABI on the right was 0.84 with a TBI of 0.97 on the left was 0.62 with a TBI of 0.75. This actually indicates fairly good blood flow all things considered. Fortunately I do not see any evidence of significant infection at this point as well which is also good news. 02-10-2022 upon evaluation today patient appears to be doing well currently in regard to his wound. He is actually been tolerating the dressing changes without complication. Fortunately I do not see any signs of infection which is great news. With that being said I see a lot of new granulation tissue and I am actually extremely pleased with where we stand compared  to last time I saw him I think that there is a lot of new growth over top of the bone area which is excellent news that is exactly what we wanted to see. I think there is probably only about 30% exposure of the bone compared to last time I saw him. Objective Constitutional Well-nourished and well-hydrated in no acute distress. Vitals Time Taken: 10:25 AM, Height: 75 in, Weight: 168 lbs, BMI: 21, Temperature: 97.7 F, Pulse: 72 bpm, Respiratory Rate: 18 breaths/min, Blood Pressure: 132/78 mmHg. Respiratory normal breathing without difficulty. Psychiatric this patient is able to make decisions and demonstrates good insight into disease process. Alert and Oriented x 3. pleasant and cooperative. General Notes: Upon inspection patient's wound bed actually showed signs of good granulation epithelization at this point. Fortunately I do  not see any signs of active infection locally or systemically which is great news and overall I am extremely pleased with where we stand currently. Integumentary (Hair, Skin) Wound #2 status is Open. Original cause of wound was Laceration. The date acquired was: 11/24/2021. The wound has been in treatment 4 weeks. The wound is BYAN, POPLASKI ([id number]) [id number] Page 6 of 7 located on the Right,Midline,Anterior Lower Leg. The wound measures 4.7cm length x 1.7cm width x 0.4cm depth; 6.275cm^2 area and 2.51cm^3 volume. There is bone and Fat Layer (Subcutaneous Tissue) exposed. There is no tunneling or undermining noted. There is a medium amount of serosanguineous drainage noted. There is small (1-33%) red, hyper - granulation within the wound bed. There is a small (1-33%) amount of necrotic tissue within the wound bed including Adherent Slough and Necrosis of Bone. 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 - patient seen at [facility] Fax [phone] Bathing/ Shower/ Hygiene: Wash wounds with antibacterial soap and water. May shower; gently cleanse wound with antibacterial soap, rinse and pat dry prior to dressing wounds Anesthetic (Use 'Patient Medications' Section for Anesthetic Order Entry): Lidocaine applied to wound bed Edema Control - Lymphedema / Segmental Compressive Device / Other: Elevate, Exercise Daily and Avoid Standing for Long Periods of Time. Elevate legs to the level of the heart and pump ankles as often as possible Elevate leg(s) parallel to the floor when sitting. WOUND #2: - Lower Leg Wound Laterality: Right, Midline, Anterior Cleanser: Soap and Water Every Other Day/30  Days Discharge Instructions: Gently cleanse wound with antibacterial soap, rinse and pat dry prior to dressing wounds Prim Dressing: Prisma 4.34 (in) Every Other Day/30 Days ary Discharge Instructions: Moisten w/normal saline or sterile water; Cover wound as directed. Do not remove from wound bed. Secondary Dressing: Xeroform Petrolatum Gauze, Overwrap 1x8 (in/in) Every Other Day/30 Days Secured With: Kerlix Roll Sterile or Non-Sterile 6-ply 4.5x4 (yd/yd) Every Other Day/30 Days Discharge Instructions: Apply Kerlix as directed Secured With: Tubigrip Size D, 3x10 (in/yd) Every Other Day/30 Days Discharge Instructions: single layer 1. I am going to suggest that we have the patient continue to monitor for any evidence of infection or worsening. If anything changes he should contact the office and let me know otherwise right now regular continue with same plan. 2. Also can recommend that along with the Prisma and Xeroform the patient continue with the Tubigrip which I think is doing a good job help with edema control. We will  see patient back for reevaluation in 1 week here in the clinic. If anything worsens or changes patient will contact our office for additional recommendations. We may be ready to switch to [facility] next week we will see how things appear. Electronic Signature(s) Signed: 02/10/2022 11:03:47 AM By: Worthy Keeler PA-C Entered By: Worthy Keeler on 02/10/2022 11:03:46 -------------------------------------------------------------------------------- SuperBill Details Patient Name: Date of Service: Ramey, [name] 02/10/2022 Medical Record Number: [id number] Patient Account Number: [id number] Date of Birth/Sex: Treating RN: 02/19/1930 (88 y.o. 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 Facility Procedures : CPT4 Code: [id number] Description: 99213 - WOUND CARE VISIT-LEV 3 EST PT Modifier: Quantity: 1 Physician Procedures : CPT4 Code Description Modifier [id number] 99213 - WC PHYS LEVEL 3 - EST PT ICD-10 Diagnosis Description S80.811A Abrasion, right lower leg, initial encounter L97.812 Non-pressure chronic ulcer of other part of right lower leg with fat layer exposed  I70.239 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

## 2022-02-10 NOTE — Progress Notes (Signed)
   Name: ELSTER CORBELLO MRN: [id number] DOB: 01/04/1930     CONSULTATION DATE:  02/10/2022   SYNOPSIS established care for COPD Patient uses his own NEB therapy as needed He takes electrodes and uses distilled water and mixes in [facility] and uses this in his own nebulizer He calls this concoction Colloidal Silver Nebulizers  He uses this NEB therapy as   Needed and helps a lot with chest congestion and cough This seems to be a form of Saline NEBS  Has PAD follows Vasc surgery He takes OTC EDTA -patient feels that this has helped his vascualr issues    CHIEF COMPLAINT:  Follow-up mild COPD Follow-up shortness of breath   HISTORY OF PRESENT ILLNESS:  Shortness of breath resolving  No exacerbation at this time No evidence of heart failure at this time No evidence or signs of infection at this time No respiratory distress No fevers, chills, nausea, vomiting, diarrhea No evidence of lower extremity edema No evidence hemoptysis   Has hard time with secretions had choking episode last week Follow-up speech therapy     BP 104/62 (BP Location: Left Arm, Cuff Size: Normal)   Pulse 61   Temp 98 F (36.7 C) (Temporal)   Ht '6\' 2"'$  (1.88 m)   Wt 177 lb (80.3 kg)   SpO2 100%   BMI 22.73 kg/m    Review of Systems: Gen:  Denies  fever, sweats, chills weight loss  HEENT: Denies blurred vision, double vision, ear pain, eye pain, hearing loss, nose bleeds, sore throat Cardiac:  No dizziness, chest pain or heaviness, chest tightness,edema, No JVD Resp:   No cough, -sputum production, -shortness of breath,-wheezing, -hemoptysis,  Other:  All other systems negative    Physical Examination:   General Appearance: No distress  EYES PERRLA, EOM intact.   NECK Supple, No JVD Pulmonary: normal breath sounds, No wheezing.  CardiovascularNormal S1,S2.  No m/r/g.   Abdomen: Benign, Soft, non-tender. ALL OTHER ROS ARE NEGATIVE     Chest x-ray February 2022 Personally  reviewed today No significant findings No pneumonia no active disease  CT chest 2018 No significant lung findings seen    ASSESSMENT / PLAN:  88 year old pleasant white male seen today for chronic intermittent shortness of breath and dyspnea exertion with underlying COPD Gold stage A    COPD mild stable Albuterol as needed No infections at this time No indication for antibiotics or steroids  Patient with dementia Recommend follow-up speech therapy Follow-up neurology as needed Patient not on any inhalers at this time     MEDICATION ADJUSTMENTS/LABS AND TESTS ORDERED: Try Flonase for nasal congestion Use as directed  Avoid secondhand smoke Avoid sick contacts Follow-up with neurology as needed Follow-up with speech therapy  CURRENT MEDICATIONS REVIEWED AT [place]   Patient satisfied with Plan of action and management. All questions answered  Follow-up in 1 year  Total time spent 25 mins   Pattijo Juste Patricia Pesa, M.D.  [facility]  Medical Director [place] Director [facility] Cardio-Pulmonary Department

## 2022-02-12 ENCOUNTER — Encounter (INDEPENDENT_AMBULATORY_CARE_PROVIDER_SITE_OTHER): Payer: Self-pay | Admitting: Nurse Practitioner

## 2022-02-12 NOTE — Progress Notes (Signed)
Subjective:    Patient ID: Theodore [name], male    DOB: 1930/03/10, 88 y.o.   MRN: [id number] Chief Complaint  Patient presents with   Follow-up    Ultrasound follow up    Theodore [name] is a 88 year old male who presents today for evaluation of his right leg in the setting of a traumatic wound.  He underwent debridement of his right lower extremity on 11/29/2021 by general surgery.  Since that time it has been healing but given the patient's underlying peripheral arterial disease there is concern that he may not be able to heal appropriately.  Today the patient has ABI 0.84 on the right and 0.62 on the left.  There is a TBI 0.97 on the right 0.75 on the left.  The studies are actually improved from his previous studies.  He has biphasic tibial artery waveforms bilaterally with good toe waveforms bilaterally.    Review of Systems  Skin:  Positive for wound.  All other systems reviewed and are negative.      Objective:   Physical Exam Vitals reviewed.  HENT:     Head: Normocephalic.  Cardiovascular:     Rate and Rhythm: Normal rate.  Pulmonary:     Effort: Pulmonary effort is normal.  Musculoskeletal:     Right lower leg: Edema present.  Skin:    General: Skin is warm and dry.  Neurological:     Mental Status: He is alert and oriented to person, place, and time.  Psychiatric:        Mood and Affect: Mood normal.        Behavior: Behavior normal.        Thought Content: Thought content normal.        Judgment: Judgment normal.     BP 126/72 (BP Location: Right Arm)   Pulse 72   Resp 16   Wt 168 lb (76.2 kg)   BMI 21.57 kg/m   Past Medical History:  Diagnosis Date   Actinic keratosis    Arthritis    knees   Asymptomatic Sinus bradycardia    a. 05/2016 Zio: Avg HR 61 (41-167).   Benign essential tremor    Cancer ([place]) 2002   melanoma right ear   Colon polyp    COPD (chronic obstructive pulmonary disease) ([place])    pt said he believes it is a misdiagnosis     Coronary artery calcification seen on CT scan    a. 06/2016 CTA Chest: cor Ca2+; b. 05/2017 MV: EF >65%. No ischemia/infarct.   Depression    History of echocardiogram    a. 04/2016 Echo: EF 60-65%, mild conc LVH. Nl PASP.   History of kidney stones    Hx of dysplastic nevus 05/07/2017   L anterior shoulder, severe   Hx of dysplastic nevus 09/06/2020   R lower sternum, moderate atypia   Hypothyroidism    Hypothyroidism    Laceration of right lower extremity    Melanoma ([place]) 1990's per pt   R ear   Melanoma in situ ([place]) 01/12/2014   left jaw   MSSA infection, non-invasive    Palpitations    a. 05/2016 Zio: Avg HR 61 (41-167). 9 SVT runs (fastest 167 - 5 beats; longest 8 beats - 101 bpm). Rare PACs/PVCs.   PVC's (premature ventricular contractions)    Rhinitis    Sepsis due to cellulitis ([place]) 11/28/2021   Severe sepsis ([place]) 11/28/2021   Wears dentures    partial upper and lower  Wears hearing aid in both ears     Social History   Socioeconomic History   Marital status: Married    Spouse name: [name]   Number of children: 4   Years of education: 16   Highest education level: Not on file  Occupational History   Occupation: Retired    Comment: Architect -   Tobacco Use   Smoking status: Never   Smokeless tobacco: Never  Vaping Use   Vaping Use: Never used  Substance and Sexual Activity   Alcohol use: Yes    Comment: 1 glass of wine daily   Drug use: No   Sexual activity: Not Currently  Other Topics Concern   Not on file  Social History Narrative   Mr. Blick grew up in [place], [place]. 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 He and his wife are living at [facility]. He is very active at [facility]. He enjoys music.    Social Determinants of  Health   Financial Resource Strain: Low Risk  (08/05/2021)   Overall Financial Resource Strain (CARDIA)    Difficulty of Paying Living Expenses: Not hard at all  Food Insecurity: No Food Insecurity (11/28/2021)   Hunger Vital Sign    Worried About Running Out of Food in the Last Year: Never true    Ran Out of Food in the Last Year: Never true  Transportation Needs: No Transportation Needs (11/28/2021)   PRAPARE - Hydrologist (Medical): No    Lack of Transportation (Non-Medical): No  Physical Activity: Insufficiently Active (08/05/2021)   Exercise Vital Sign    Days of Exercise per Week: 4 days    Minutes of Exercise per Session: 30 min  Stress: No Stress Concern Present (08/05/2021)   [place]    Feeling of Stress : Only a little  Social Connections: Socially Integrated (08/05/2021)   Social Connection and Isolation Panel [NHANES]    Frequency of Communication with Friends and Family: More than three times a week    Frequency of Social Gatherings with Friends and Family: Once a week    Attends Religious Services: 1 to 4 times per year    Active Member of [employer] or Organizations: Yes    Attends Archivist Meetings: More than 4 times per year    Marital Status: Married  Human resources officer Violence: Not At Risk (11/28/2021)   Humiliation, Afraid, Rape, and Kick questionnaire    Fear of Current or Ex-Partner: No    Emotionally Abused: No    Physically Abused: No    Sexually Abused: No    Past Surgical History:  Procedure Laterality Date   ADENOIDECTOMY     CATARACT EXTRACTION W/PHACO Left 10/22/2017   Procedure: CATARACT EXTRACTION PHACO AND INTRAOCULAR LENS PLACEMENT ([place])  LEFT;  Surgeon: Eulogio Bear, MD;  Location: [facility];  Service: Ophthalmology;  Laterality: Left;   CATARACT EXTRACTION W/PHACO Right 11/20/2017   Procedure: CATARACT EXTRACTION PHACO AND  INTRAOCULAR LENS PLACEMENT (I[facility]) RIGHT;  Surgeon: Eulogio Bear, MD;  Location: [facility];  Service: Ophthalmology;  Laterality: Right;   ESOPHAGOGASTRODUODENOSCOPY (EGD) WITH PROPOFOL N/A 10/31/2016   Procedure: ESOPHAGOGASTRODUODENOSCOPY (EGD) WITH PROPOFOL;  Surgeon: Lollie Sails, MD;  Location: [facility];  Service: Endoscopy;  Laterality: N/A;   I & D EXTREMITY Right 11/29/2021   Procedure: IRRIGATION AND DEBRIDEMENT EXTREMITY;  Surgeon: Olean Ree, MD;  Location: [facility];  Service: General;  Laterality: Right;   JOINT REPLACEMENT Right 2003   knee   knee meniscus repair Right    MELANOMA EXCISION Right    ear. Followed by Dr. Nehemiah Massed   PATELLECTOMY Bilateral    TONSILLECTOMY      Family History  Problem Relation Age of Onset   Hypertension Mother    Heart disease Mother        CHF   Heart disease Father    Cancer Sister        melanoma    Allergies  Allergen Reactions   Penicillins Rash and Other (See Comments)    Other reaction(s): Other (see comments) Other reaction(s): UNKNOWN   Benzonatate     Pruritic rash   Lexapro [Escitalopram] Other (See Comments)    Increased hand tremor   Molnupiravir     Pruritic rash on neck        Latest Ref Rng & Units 01/04/2022   11:57 AM 12/05/2021    7:26 AM 12/02/2021    4:33 AM  CBC  WBC 4.0 - 10.5 K/uL 6.6  6.2  7.4   Hemoglobin 13.0 - 17.0 g/dL 12.9  11.8  11.5   Hematocrit 39.0 - 52.0 % 38.5  35.6  35.4   Platelets 150.0 - 400.0 K/uL 275.0  349  245       CMP     Component Value Date/Time   NA 137 01/23/2022 1404   K 4.1 01/23/2022 1404   CL 103 01/23/2022 1404   CO2 28 01/23/2022 1404   GLUCOSE 106 (H) 01/23/2022 1404   BUN 28 (H) 01/23/2022 1404   CREATININE 1.19 01/23/2022 1404   CREATININE 0.92 02/03/2015 1504   CALCIUM 8.7 01/23/2022 1404   PROT 6.2 01/04/2022 1157   ALBUMIN 4.0 01/04/2022 1157   AST 19 01/04/2022 1157   ALT 17 01/04/2022 1157   ALKPHOS 56 01/04/2022 1157    BILITOT 0.9 01/04/2022 1157   GFRNONAA >60 12/05/2021 0726   GFRNONAA 77 02/03/2015 1504   GFRAA >60 05/10/2016 0236   GFRAA 89 02/03/2015 1504     VAS [place] ABI WITH/WO TBI  Result Date: 01/30/2022  [place] STUDY Patient Name:  Theodore [name]  Date of Exam:   01/25/2022 Medical Rec #: [id number]      Accession #:    [id number] Date of Birth: Feb 05, 1930      Patient Gender: M Patient Age:   22 years Exam Location:  [facility] Procedure:      VAS [place] ABI WITH/WO TBI Referring Phys: Leotis Pain --------------------------------------------------------------------------------  Indications: Gangrene, and peripheral artery disease.  Performing Technologist: Almira Coaster RVS  Examination Guidelines: A complete evaluation includes at minimum, Doppler waveform signals and systolic blood pressure reading at the level of bilateral brachial, anterior tibial, and posterior tibial arteries, when vessel segments are accessible. Bilateral testing is considered an integral part of a complete examination. Photoelectric Plethysmograph (PPG) waveforms and toe systolic pressure readings are included as required and additional duplex testing as needed. Limited examinations for reoccurring indications may be performed as noted.  ABI Findings: +---------+------------------+-----+--------+--------+ Right    Rt Pressure (mmHg)IndexWaveformComment  +---------+------------------+-----+--------+--------+ Brachial 146                                     +---------+------------------+-----+--------+--------+  ATA      126               0.84 biphasic         +---------+------------------+-----+--------+--------+ PTA      91                0.61 biphasic         +---------+------------------+-----+--------+--------+ Great Toe145               0.97 Normal           +---------+------------------+-----+--------+--------+ +---------+------------------+-----+--------+-------+ Left      Lt Pressure (mmHg)IndexWaveformComment +---------+------------------+-----+--------+-------+ Brachial 150                                    +---------+------------------+-----+--------+-------+ ATA      93                0.62 biphasic        +---------+------------------+-----+--------+-------+ PTA      93                0.62 biphasic        +---------+------------------+-----+--------+-------+ Great Toe113               0.75 Normal          +---------+------------------+-----+--------+-------+ +-------+-----------+-----------+------------+------------+ ABI/TBIToday's ABIToday's TBIPrevious ABIPrevious TBI +-------+-----------+-----------+------------+------------+ Right  .84        .97        .61         .60          +-------+-----------+-----------+------------+------------+ Left   .62        .75        .49         .47          +-------+-----------+-----------+------------+------------+ Bilateral ABIs appear increased compared to prior study on 09/30/2021. Bilateral TBIs appear increased compared to prior study on 09/30/2021.  Summary: Right: Resting right ankle-brachial index indicates mild right lower extremity arterial disease. The right toe-brachial index is normal. Left: Resting left ankle-brachial index indicates moderate left lower extremity arterial disease. The left toe-brachial index is normal. *See table(s) above for measurements and observations.   Electronically signed by Leotis Pain MD on 01/30/2022 at 1:03:42 PM.    Final        Assessment & Plan:   1. PAD (peripheral artery disease) ([place]) Based on noninvasive studies patient should have adequate ability for wound healing.  We will continue to follow with wound care.  We will have a close follow-up in approximately 4 weeks with repeat ABIs to ensure patient has continued wound healing and to ensure intervention is not necessary.  2. Dyslipidemia Continue statin as ordered and reviewed, no  changes at this time   Current Outpatient Medications on File Prior to Visit  Medication Sig Dispense Refill   ascorbic acid (VITAMIN C) 500 MG tablet Take 500 mg by mouth daily.     buPROPion (WELLBUTRIN SR) 150 MG 12 hr tablet Take 150 mg by mouth daily.     chlorhexidine (HIBICLENS) 4 % external liquid Apply 1 Application topically as directed. Apply to Right lower leg topically every day shift for use to clean and flush wound for 7 Days     Cholecalciferol (VITAMIN D3) 50 MCG (2000 UT) TABS Take 1 tablet by mouth daily.     cycloSPORINE (RESTASIS) 0.05 % ophthalmic emulsion Place 1 drop into both eyes  2 (two) times daily.     ezetimibe (ZETIA) 10 MG tablet TAKE 1 TABLET BY MOUTH EVERY DAY 60 tablet 0   levothyroxine (SYNTHROID) 100 MCG tablet TAKE 1 TABLET BY MOUTH EVERY DAY BEFORE BREAKFAST 90 tablet 0   losartan (COZAAR) 25 MG tablet Take 1 tablet (25 mg total) by mouth daily. 90 tablet    magnesium oxide (MAG-OX) 400 MG tablet Take 400 mg by mouth daily.     meloxicam (MOBIC) 7.5 MG tablet Take 1 tablet (7.5 mg total) by mouth daily. 30 tablet 0   mirtazapine (REMERON) 15 MG tablet Take 1 tablet (15 mg total) by mouth at bedtime. (Patient not taking: Reported on 02/01/2022)     rosuvastatin (CRESTOR) 10 MG tablet Take 1 tablet (10 mg total) by mouth daily. 90 tablet 1   Saccharomyces boulardii (PROBIOTIC) 250 MG CAPS Take 1 capsule by mouth daily at 12 noon.     testosterone cypionate (DEPOTESTOSTERONE CYPIONATE) 200 MG/ML injection Inject 1 mL (200 mg total) into the muscle every 14 (fourteen) days. 1 mL 2   vitamin B-12 (CYANOCOBALAMIN) 500 MCG tablet Take 1 tablet by mouth daily.     No current facility-administered medications on file prior to visit.    There are no Patient Instructions on file for this visit. No follow-ups on file.   Kris Hartmann, NP

## 2022-02-13 DIAGNOSIS — M1712 Unilateral primary osteoarthritis, left knee: Secondary | ICD-10-CM | POA: Diagnosis not present

## 2022-02-14 ENCOUNTER — Other Ambulatory Visit: Payer: Self-pay | Admitting: Internal Medicine

## 2022-02-14 ENCOUNTER — Telehealth: Payer: Self-pay

## 2022-02-14 NOTE — Telephone Encounter (Signed)
Patient's wife, Voshon Petro, called to state Dr. [name] asked them to call and let her know if they have not heard from anyone regarding scheduling patient with speech therapy.  [name] states they have not heard from anyone.

## 2022-02-14 NOTE — Telephone Encounter (Signed)
Pt has not heard from anyone about scheduling speech therapy.

## 2022-02-15 NOTE — Telephone Encounter (Signed)
Refilled: 12/22/221 Last OV: 02/01/2022 Next OV: not scheduled

## 2022-02-16 ENCOUNTER — Ambulatory Visit: Payer: PPO | Admitting: Physician Assistant

## 2022-02-16 NOTE — Telephone Encounter (Signed)
FYI

## 2022-02-17 ENCOUNTER — Encounter: Payer: PPO | Admitting: Physician Assistant

## 2022-02-17 DIAGNOSIS — S80811A Abrasion, right lower leg, initial encounter: Secondary | ICD-10-CM | POA: Diagnosis not present

## 2022-02-17 DIAGNOSIS — L97816 Non-pressure chronic ulcer of other part of right lower leg with bone involvement without evidence of necrosis: Secondary | ICD-10-CM | POA: Diagnosis not present

## 2022-02-17 NOTE — Progress Notes (Signed)
MCCLAIN, SHALL A ([id number]) [id number] Page 1 of 9 Visit Report for 02/17/2022 Arrival Information Details Patient Name: Date of Service: Theodore [name], Theodore [name] 02/17/2022 9:45 A M Medical Record Number: [id number] Patient Account Number: [id number] Date of Birth/Sex: Treating RN: [birth year] (88 y.o. Theodore [name]) [name] Primary Care Naudia Crosley: [name] Other Clinician: Referring Dedra Matsuo: Treating Infant Doane/Extender: [name] in Treatment: 5 Visit Information History Since Last Visit All ordered tests and consults were completed: No Patient Arrived: Ambulatory Added or deleted any medications: No Arrival Time: 09:45 Any new allergies or adverse reactions: No Accompanied By: wife Had a fall or experienced change in No Transfer Assistance: None activities of daily living that may affect Patient Identification Verified: Yes risk of falls: Secondary Verification Process Completed: Yes Signs or symptoms of abuse/neglect since last visito No Patient Requires Transmission-Based Precautions: No Hospitalized since last visit: No Patient Has Alerts: Yes Implantable device outside of the clinic excluding No Patient Alerts: ABI R .61 09/30/21 TBI .38 cellular tissue based products placed in the center ABI L .49 09/30/21 TBI .47 since last visit: Has Dressing in Place as Prescribed: Yes Has Compression in Place as Prescribed: Yes Pain Present Now: No Electronic Signature(s) Unsigned Entered ByCarlene Coria on 02/17/2022 09:50:18 -------------------------------------------------------------------------------- Clinic Level of Care Assessment Details Patient Name: Date of Service: Theodore [name] 02/17/2022 9:45 A M Medical Record Number: [id number] Patient Account Number: [id number] Date of Birth/Sex: Treating RN: May 02, 1929 (88 y.o. [name] Primary Care Glee Lashomb: [name] Other Clinician: Referring Krishav Mamone: Treating Tamara Monteith/Extender:  [name] in Treatment: 5 Clinic Level of Care Assessment Items TOOL 4 Quantity Score X- 1 0 Use when only an EandM is performed on FOLLOW-UP visit ASSESSMENTS - Nursing Assessment / Reassessment SANTANA, EDELL ([id number]) [id number] Page 2 of 9 X- 1 10 Reassessment of Co-morbidities (includes updates in patient status) X- 1 5 Reassessment of Adherence to Treatment Plan ASSESSMENTS - Wound and Skin A ssessment / Reassessment X - Simple Wound Assessment / Reassessment - one wound 1 5 _0  - 0 Complex Wound Assessment / Reassessment - multiple wounds _1  - 0 Dermatologic / Skin Assessment (not related to wound area) ASSESSMENTS - Focused Assessment _2  - 0 Circumferential Edema Measurements - multi extremities _3  - 0 Nutritional Assessment / Counseling / Intervention _4  - 0 Lower Extremity Assessment (monofilament, tuning fork, pulses) _5  - 0 Peripheral Arterial Disease Assessment (using hand held doppler) ASSESSMENTS - Ostomy and/or Continence Assessment and Care _6  - 0 Incontinence Assessment and Management _7  - 0 Ostomy Care Assessment and Management (repouching, etc.) PROCESS - Coordination of Care X - Simple Patient / Family Education for ongoing care 1 15 _8  - 0 Complex (extensive) Patient / Family Education for ongoing care _9  - 0 Staff obtains Programmer, systems, Records, T Results / Process Orders est _10  - 0 Staff telephones HHA, Nursing Homes / Clarify orders / etc _11  - 0 Routine Transfer to another Facility (non-emergent condition) _12  - 0 Routine Hospital Admission (non-emergent condition) _13  - 0 New Admissions / Biomedical engineer / Ordering NPWT Apligraf, etc. , _14  - 0 Emergency Hospital Admission (emergent condition) X- 1 10 Simple Discharge Coordination _15  - 0 Complex (extensive) Discharge Coordination PROCESS - Special Needs _16  - 0 Pediatric / Minor Patient Management _17  - 0 Isolation Patient  Management _18  - 0 Hearing / Language / Visual special needs _19  - 0 Assessment of Community assistance (transportation, D/C planning, etc.) _20  - 0 Additional assistance /  Altered mentation _0  - 0 Support Surface(s) Assessment (bed, cushion, seat, etc.) INTERVENTIONS - Wound Cleansing / Measurement X - Simple Wound Cleansing - one wound 1 5 _1  - 0 Complex Wound Cleansing - multiple wounds X- 1 5 Wound Imaging (photographs - any number of wounds) _2  - 0 Wound Tracing (instead of photographs) X- 1 5 Simple Wound Measurement - one wound _3  - 0 Complex Wound Measurement - multiple wounds INTERVENTIONS - Wound Dressings X - Small Wound Dressing one or multiple wounds 1 10 _4  - 0 Medium Wound Dressing one or multiple wounds _5  - 0 Large Wound Dressing one or multiple wounds <EBXIDHWYSHUOHFGB>_0<\/SXJDBZMCEYEMVVKP>_2  - 0 Application of Medications - topical <AESLPNPYYFRTMYTR>_1<\/NBVAPOLIDCVUDTHY>_3  - 0 Application of Medications - injection Neumeier, Carleton A ([id number]) [id number] Page 3 of 9 INTERVENTIONS - Miscellaneous _8  - 0 External ear exam _9  - 0 Specimen Collection (cultures, biopsies, blood, body fluids, etc.) _10  - 0 Specimen(s) / Culture(s) sent or taken to Lab for analysis _11  - 0 Patient Transfer (multiple staff / Harrel Lemon Lift / Similar devices) _12  - 0 Simple Staple / Suture removal (25 or less) _13  - 0 Complex Staple / Suture removal (26 or more) _14  - 0 Hypo / Hyperglycemic Management (close monitor of Blood Glucose) _15  - 0 Ankle / Brachial Index (ABI) - do not check if billed separately X- 1 5 Vital Signs Has the patient been seen at the hospital within the last three years: Yes Total Score: 75 Level Of Care: New/Established - Level 2 Electronic Signature(s) Unsigned Entered ByCarlene Coria on 02/17/2022 10:26:32 -------------------------------------------------------------------------------- Encounter Discharge Information Details Patient Name: Date of Service: REYNALDO, ROSSMAN 02/17/2022 9:45 A M Medical  Record Number: [id number] Patient Account Number: [id number] Date of Birth/Sex: Treating RN: 01-Feb-1930 (88 y.o. Theodore [name]) [name] Primary Care Kyiesha Millward: [name] Other Clinician: Referring Kazia Grisanti: Treating Keiandra Sullenger/Extender: [name] in Treatment: 5 Encounter Discharge Information Items Discharge Condition: Stable Ambulatory Status: Walker Discharge Destination: Home Transportation: Private Auto Accompanied By: self Schedule Follow-up Appointment: Yes Clinical Summary of Care: Electronic Signature(s) Signed: 02/17/2022 10:30:55 AM By: [name] RN Entered By: [name] on 02/17/2022 10:30:55 Stahl, Elhadji A ([id number]) [id number] Page 4 of 9 -------------------------------------------------------------------------------- Lower Extremity Assessment Details Patient Name: Date of Service: WINFERD, WEASE 02/17/2022 9:45 A M Medical Record Number: [id number] Patient Account Number: [id number] Date of Birth/Sex: Treating RN: [birth year] (88 y.o. Theodore [name]) [name] Primary Care Adileny Delon: [name] Other Clinician: Referring Kathrene Sinopoli: Treating Shahira Fiske/Extender: [name] Weeks in Treatment: 5 Edema Assessment Assessed: Shirlyn Goltz: No] Patrice Paradise: No] [Left: Edema] [Right: :] Calf Left: Right: Point of Measurement: From Medial Instep 31 cm Ankle Left: Right: Point of Measurement: From Medial Instep 23 cm Electronic Signature(s) Unsigned Entered ByCarlene Coria on 02/17/2022 09:57:05 -------------------------------------------------------------------------------- Multi Wound Chart Details Patient Name: Date of Service: TREVAN, MESSMAN 02/17/2022 9:45 A M Medical Record Number: [id number] Patient Account Number: [id number] Date of Birth/Sex: Treating RN: 03-20-1929 (88 y.o. Theodore [name]) [name] Primary Care Teshara Moree: [name] Other Clinician: Referring Ambre Kobayashi: Treating Landi Biscardi/Extender: [name] Weeks in Treatment: 5 Vital Signs Height(in): 75 Pulse(bpm): 93 Weight(lbs): 168 Blood Pressure(mmHg): 144/85 Body Mass Index(BMI): 21 Temperature(F): 97.6 Respiratory Rate(breaths/min): 18 [2:Photos:] [N/A:N/A] Right, Midline, Anterior Lower Leg N/A N/A Wound Location: Laceration N/A N/A Wounding Event: Atypical N/A N/A Primary Etiology: Trauma, Other N/A N/A Secondary Etiology: Chronic Obstructive Pulmonary N/A N/A Comorbid History: Disease (COPD), Peripheral Arterial Disease 11/24/2021 N/A N/A Date Acquired: 5 N/A  N/A Weeks of Treatment: Open N/A N/A Wound Status: No N/A N/A Wound Recurrence: 4.5x1.5x0.4 N/A N/A Measurements L x W x D (cm) 5.301 N/A N/A A (cm) : rea 2.121 N/A N/A Volume (cm) : 45.90% N/A N/A % Reduction in Area: 69.10% N/A N/A % Reduction in Volume: Full Thickness With Exposed Support N/A N/A Classification: Structures Medium N/A N/A Exudate Amount: Serosanguineous N/A N/A Exudate Type: red, brown N/A N/A Exudate Color: Small (1-33%) N/A N/A Granulation Amount: Red, Hyper-granulation N/A N/A Granulation Quality: Small (1-33%) N/A N/A Necrotic Amount: Fat Layer (Subcutaneous Tissue): Yes N/A N/A Exposed Structures: Bone: Yes Fascia: No Tendon: No Muscle: No Joint: No Small (1-33%) N/A N/A Epithelialization: Treatment Notes Electronic Signature(s) Unsigned Entered ByCarlene Coria on 02/17/2022 09:57:14 -------------------------------------------------------------------------------- Multi-Disciplinary Care Plan Details Patient Name: Date of Service: ELIYOHU, CLASS 02/17/2022 9:45 A M Medical Record Number: [id number] Patient Account Number: [id number] Date of Birth/Sex: Treating RN: Mar 09, 1930 (88 y.o. [name] Primary Care Hien Perreira: [name] Other Clinician: Referring Susi Goslin: Treating Kemisha Bonnette/Extender: [name] Weeks in Treatment: 5 Active Inactive Necrotic Tissue Nursing  Diagnoses: Impaired tissue integrity related to necrotic/devitalized tissue TY, BUNTROCK A ([id number]) [id number] Page 6 of 9 Knowledge deficit related to management of necrotic/devitalized tissue Goals: Necrotic/devitalized tissue will be minimized in the wound bed Date Initiated: 01/20/2022 Target Resolution Date: 02/19/2022 Goal Status: Active Patient/caregiver will verbalize understanding of reason and process for debridement of necrotic tissue Date Initiated: 01/20/2022 Target Resolution Date: 02/19/2022 Goal Status: Active Interventions: Assess patient pain level pre-, during and post procedure and prior to discharge Provide education on necrotic tissue and debridement process Treatment Activities: Apply topical anesthetic as ordered : 01/20/2022 Excisional debridement : 01/20/2022 Notes: Wound/Skin Impairment Nursing Diagnoses: Impaired tissue integrity Knowledge deficit related to smoking impact on wound healing Knowledge deficit related to ulceration/compromised skin integrity Goals: Patient will demonstrate a reduced rate of smoking or cessation of smoking Date Initiated: 01/13/2022 Date Inactivated: 02/17/2022 Target Resolution Date: 02/12/2022 Goal Status: Met Patient will have a decrease in wound volume by X% from date: (specify in notes) Date Initiated: 01/13/2022 Date Inactivated: 02/17/2022 Target Resolution Date: 02/12/2022 Goal Status: Met Patient/caregiver will verbalize understanding of skin care regimen Date Initiated: 01/13/2022 Target Resolution Date: 02/12/2022 Goal Status: Active Ulcer/skin breakdown will have a volume reduction of 30% by week 4 Date Initiated: 01/13/2022 Target Resolution Date: 02/12/2022 Goal Status: Active Ulcer/skin breakdown will have a volume reduction of 50% by week 8 Date Initiated: 01/13/2022 Target Resolution Date: 03/15/2022 Goal Status: Active Ulcer/skin breakdown will have a volume reduction of 80% by  week 12 Date Initiated: 01/13/2022 Target Resolution Date: 04/15/2022 Goal Status: Active Ulcer/skin breakdown will heal within 14 weeks Date Initiated: 01/13/2022 Target Resolution Date: 05/14/2022 Goal Status: Active Interventions: Assess patient/caregiver ability to obtain necessary supplies Assess patient/caregiver ability to perform ulcer/skin care regimen upon admission and as needed Assess ulceration(s) every visit Provide education on ulcer and skin care Screen for HBO Treatment Activities: Skin care regimen initiated : 01/13/2022 Topical wound management initiated : 01/13/2022 Notes: Electronic Signature(s) Signed: 02/17/2022 10:29:38 AM By: [name] RN Entered By: [name] on 02/17/2022 10:29:37 Mancini, Jacy A ([id number]) [id number] Page 7 of 9 -------------------------------------------------------------------------------- Pain Assessment Details Patient Name: Date of Service: KILAN, BANFILL 02/17/2022 9:45 A M Medical Record Number: [id number] Patient Account Number: [id number] Date of Birth/Sex: Treating RN: 25-Nov-1929 (88 y.o. Theodore [name]) [name] Primary Care Jesi Jurgens: [name] Other Clinician: Referring Kacelyn Rowzee: Treating Johnhenry Tippin/Extender: Jeri Cos  [name] Weeks in Treatment: 5 Active Problems Location of Pain Severity and Description of Pain Patient Has Paino No Site Locations Pain Management and Medication Current Pain Management: Electronic Signature(s) Unsigned Entered ByCarlene Coria on 02/17/2022 09:50:41 -------------------------------------------------------------------------------- Patient/Caregiver Education Details Patient Name: Date of Service: Bark, [name] 12/8/2023andnbsp9:45 [place] Record Number: [id number] Patient Account Number: [id number] Date of Birth/Gender: Treating RN: 07-31-1929 (88 y.o. [name] Primary Care Physician: [name] Other Clinician: Referring  Physician: Treating Physician/Extender: [name] Weeks in Treatment: [place], Eriverto A ([id number]) [id number] Page 8 of 9 Education Assessment Education Provided To: Patient Education Topics Provided Wound/Skin Impairment: Methods: Explain/Verbal Responses: State content correctly Electronic Signature(s) Unsigned Entered By: [name] on 02/17/2022 10:27:00 -------------------------------------------------------------------------------- Wound Assessment Details Patient Name: Date of Service: DEMETRIE, BORGE 02/17/2022 9:45 A M Medical Record Number: [id number] Patient Account Number: [id number] Date of Birth/Sex: Treating RN: January 29, 1930 (88 y.o. Theodore [name]) [name] Primary Care Tanaia Hawkey: [name] Other Clinician: Referring Judianne Seiple: Treating Matvey Llanas/Extender: [name] Weeks in Treatment: 5 Wound Status Wound Number: 2 Primary Atypical Etiology: Wound Location: Right, Midline, Anterior Lower Leg Secondary Trauma, Other Wounding Event: Laceration Etiology: Date Acquired: 11/24/2021 Wound Status: Open Weeks Of Treatment: 5 Comorbid Chronic Obstructive Pulmonary Disease (COPD), Peripheral Clustered Wound: No History: Arterial Disease Photos Wound Measurements Length: (cm) 4.5 Width: (cm) 1.5 Depth: (cm) 0.4 Area: (cm) 5.301 Volume: (cm) 2.121 % Reduction in Area: 45.9% % Reduction in Volume: 69.1% Epithelialization: Small (1-33%) Tunneling: No Undermining: No Wound Description Classification: Full Thickness With Exposed Support Structures Domzalski, Edsel A ([id number]) Exudate Amount: Medium Exudate Type: Serosanguineous Exudate Color: red, brown Foul Odor After Cleansing: No [id number] Page 9 of 9 Slough/Fibrino Yes Wound Bed Granulation Amount: Small (1-33%) Exposed Structure Granulation Quality: Red, Hyper-granulation Fascia Exposed: No Necrotic Amount: Small  (1-33%) Fat Layer (Subcutaneous Tissue) Exposed: Yes Necrotic Quality: Adherent Slough, Bone Tendon Exposed: No Muscle Exposed: No Joint Exposed: No Bone Exposed: Yes Electronic Signature(s) Unsigned Entered ByCarlene Coria on 02/17/2022 09:56:30 -------------------------------------------------------------------------------- Vitals Details Patient Name: Date of Service: Donze, [name] 02/17/2022 9:45 A M Medical Record Number: [id number] Patient Account Number: [id number] Date of Birth/Sex: Treating RN: 10-23-1929 (88 y.o. Theodore [name]) [name] Primary Care Ader Fritze: [name] Other Clinician: Referring Syriah Delisi: Treating Mary-Anne Polizzi/Extender: [name] Weeks in Treatment: 5 Vital Signs Time Taken: 09:50 Temperature (F): 97.6 Height (in): 75 Pulse (bpm): 66 Weight (lbs): 168 Respiratory Rate (breaths/min): 18 Body Mass Index (BMI): 21 Blood Pressure (mmHg): 144/85 Reference Range: 80 - 120 mg / dl Electronic Signature(s) Unsigned Entered ByCarlene Coria on 02/17/2022 09:50:35 Signature(s): Date(s):

## 2022-02-17 NOTE — Progress Notes (Signed)
[place], RAPPAPORT [name] ([id number]) [id number] Page 1 of 7 Visit Report for 02/17/2022 Chief Complaint Document Details Patient Name: Date of Service: Theodore [name], Theodore [name] 02/17/2022 9:45 [name] Medical Record Number: [id number] Patient Account Number: [id number] Date of Birth/Sex: Treating RN: Jul 04, 1929 (88 y.o. Theodore [name]) [name] Primary Care Provider: [name] Other Clinician: Referring Provider: Treating Provider/Extender: [name] Weeks in Treatment: 5 Information Obtained from: Patient Chief Complaint Right LE Ulcer Electronic Signature(s) Signed: 02/17/2022 9:48:59 AM By: Worthy Keeler PA-C Entered By: Worthy Keeler on 02/17/2022 09:48:59 -------------------------------------------------------------------------------- HPI Details Patient Name: Date of Service: [name], Theodore [name]. 02/17/2022 9:45 [name] Medical Record Number: [id number] Patient Account Number: [id number] Date of Birth/Sex: Treating RN: [birth year] (88 y.o. [name] Primary Care Provider: [name] Other Clinician: Referring Provider: Treating Provider/Extender: [name] Weeks in Treatment: 5 History of Present Illness HPI Description: ADMISSION 10/03/2021 This is [name] 88 year old man who was pulling [name] cart 3 months ago he had some sort of imbalance issue hitting his left anterior lower leg. He is not sure exactly what he hit. He is able to show me photos on his phone that show [name] laceration on the left anterior lower leg in an inverted L-shaped and. He initially did very little to this himself. Saw his primary doctor on 09/21/2021 [place] instructed him to put Vaseline gauze on this which he did for short period of time. He says that things are getting [name] lot smaller and are improving he has not really been treating this with anything. The other major problem the patient has is peripheral arterial disease. He is followed by vein and vascular and has had [name] series  of follow-up arterial studies. Most recently this was done on 09/30/2021 showing on the left and ABI of 0.49 with [name] great toe pressure of 0.47 and monophasic waveforms. This is somewhat worse than test he had done on 03/21/2021 at which time the ABI was 0.68 TBI of 0.51. I think because of his age and the fact he has underlying stage IIIa chronic kidney disease and the fact the wound is healing they have not been scheduling him for anything aggressive but follow-up studies are apparently booked for 9-month Past medical history includes coronary artery disease, lumbar radiculopathy, COPD which is limiting and stage IIIa chronic renal failure. He has PAD as noted. He also tells me that he had [name] surgical procedure for essential tremor which left him with some mild dysarthria and gait and balance problems SDESHANE, [name] ([id number] Page 2 of 7 10-10-2021 upon evaluation today patient appears to be doing decently well in regard to his wound all things considered. He is very slow to heal due to his low ABIs. With that being said it does appear that he is making some progress nonetheless there is little bit of dry skin and tissue around the edges of the wound I think [name] very light debridement to clear this away could be of benefit for him. The patient is in agreement with that plan. For that reason I did actually perform debridement to clear that away today but did so extremely carefully. 10-17-2021 upon evaluation today patient appears to be doing well currently in regard to his wound in fact this appears to be completely healed based on what I am seeing today. I do not see any evidence of active infection locally or systemically which is great news. No fevers, chills, nausea, vomiting, or diarrhea. Readmission: 01-13-2022 patient  presents today for follow-up evaluation although the last time I saw him was actually August of this year. At that time he actually was  doing decently well with regard to the wound on the left leg. Unfortunately however it was noted that he had noted poor ABIs and again I somehow missed documented the right side at the previous admission but his right ABI was 0.61 with [name] TBI of 0.38. Based on what I am seeing today and considering his arterial flow which is not very good I do believe that he likely needs to be seen by vascular soon as possible I think that is contributing to his poor healing in regard to this right leg. I discussed that with him today and the individual who I am assuming to be [name] family member that was present with him as well although I did not inquire in order to confirm. 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 He subsequently had an injury where he sustained [name] laceration this was on November 24, 2021. He then ended up with [name] need for reopening this with incision and drainage on 11-29-2021. I did look through the pictures that his family member had as well from the beginning through now and there has been some improvement though [name] lot of the granulation tissue is somewhat hypergranular there is also some evidence that the bone is drying out of the central portion of the wound. Unfortunately I do not think that this is healing nearly as well as I would like to see. I believe his blood flow is affecting this significantly and I think that needs to be addressed ASAP. Otherwise the patient's past medical history really has not changed significantly since the last time I saw him. That can be referenced above. 11/10; second visit for this wound on the right anterior upper tibia. As I understand things he initially had [name] fall while putting something in his attic. Initially the wound was sutured but became secondarily infected and had to be opened by general surgery culturing MSSA. He was followed by Dr. Steva Ready of infectious disease and is completed antibiotics [Keflex]. He has completed  this. Currently we are using Xeroform with backing Dakin's changing this daily. He has formal arterial studies ordered for vein and vascular next Wednesday apparently in response to [name] ABI of 0.6 Patient is asking about Medihoney which they apparently have in the [facility] clinic. I cannot pick up any claudication by history. 01-27-2022 upon evaluation today patient's wound is doing [name] little bit better and overall appearance fortunately there does not appear to be any signs of infection locally or systemically at this time which is great news and overall I am extremely pleased with where we stand. No fevers, chills, nausea, vomiting, or diarrhea. He did have his arterial study which I did review today. He is ABI on the right was 0.84 with [name] TBI of 0.97 on the left was 0.62 with [name] TBI of 0.75. This actually indicates fairly good blood flow all things considered. Fortunately I do not see any evidence of significant infection at this point as well which is also good news. 02-10-2022 upon evaluation today patient appears to be doing well currently in regard to his wound. He is actually been tolerating the dressing changes without complication. Fortunately I do not see any signs of infection which is great news. With that being said I see [name] lot of new granulation tissue and I am actually extremely pleased with where  we stand compared to last time I saw him I think that there is [name] lot of new growth over top of the bone area which is excellent news that is exactly what we wanted to see. I think there is probably only about 30% exposure of the bone compared to last time I saw him. 02-17-2022 upon evaluation today patient appears to be doing well currently in regard to his wound. He is slowly showing signs of covering over this bone which is good the tissue is [name] little hyper granulated but nonetheless does seem to be doing better than previous. Electronic Signature(s) Signed: 02/17/2022 10:05:46 AM By: Worthy Keeler PA-C Entered By: Worthy Keeler on 02/17/2022 10:05:45 -------------------------------------------------------------------------------- Physical Exam Details Patient Name: Date of Service: Schommer, Theodore [name]. 02/17/2022 9:45 [name] Medical Record Number: [id number] Patient Account Number: [id number] Date of Birth/Sex: Treating RN: 1929/04/14 (88 y.o. [name] Primary Care Provider: [name] Other Clinician: Referring Provider: Treating Provider/Extender: [name] Weeks in Treatment: 5 Constitutional Well-nourished and well-hydrated in no acute distress. Respiratory normal breathing without difficulty. Psychiatric this patient is able to make decisions and demonstrates good insight into disease process. Alert and Oriented x 3. pleasant and cooperative. Notes Upon inspection patient's wound bed actually showed signs of good granulation epithelization at this point. Fortunately I do not see any evidence of infection locally nor systemically which is great news and overall I am extremely pleased with where we stand. LIJAH, BOURQUE [name] ([id number]) [id number] Page 3 of 7 Electronic Signature(s) Signed: 02/17/2022 10:06:01 AM By: Worthy Keeler PA-C Entered By: Worthy Keeler on 02/17/2022 10:06:01 -------------------------------------------------------------------------------- Physician Orders Details Patient Name: Date of Service: Desha, Theodore [name]. 02/17/2022 9:45 [name] Medical Record Number: [id number] Patient Account Number: [id number] Date of Birth/Sex: Treating RN: 02-03-1930 (88 y.o. 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 - patient seen at [facility] Fax [phone] Return [name] Bathing/ Shower/ Hygiene Wash wounds with antibacterial soap and water. May shower; gently cleanse wound with antibacterial soap, rinse and pat dry prior to dressing wounds Anesthetic (Use 'Patient Medications' Section for Anesthetic Order Entry) Lidocaine applied to wound bed Edema Control - Lymphedema / Segmental Compressive Device / Other Elevate, Exercise Daily and [name] void Standing for Long Periods of Time. Elevate legs to the level of the heart and pump ankles as often as possible Elevate leg(s) parallel to the floor when sitting. Wound Treatment Wound #2 - Lower Leg Wound Laterality: Right, Midline, Anterior Cleanser: Soap and Water Every Other Day/30 Days Discharge Instructions: Gently cleanse wound with antibacterial soap, rinse and pat dry prior to dressing wounds Prim Dressing: Prisma 4.34 (in) Every Other Day/30 Days ary Discharge Instructions: Moisten w/normal saline or sterile water; Cover wound as directed. Do not remove from wound bed. Secondary Dressing: Xeroform Petrolatum Gauze, Overwrap 1x8 (in/in) Every Other Day/30 Days Secured With: Kerlix Roll Sterile or Non-Sterile 6-ply 4.5x4 (yd/yd) Every Other Day/30 Days Discharge Instructions: Apply Kerlix as directed Secured With: Tubigrip Size D, 3x10 (in/yd) Every Other Day/30 Days Discharge Instructions: single layer Electronic Signature(s)  Signed: 02/17/2022 12:49:38 PM By: Worthy Keeler PA-C Signed: 02/21/2022 4:34:43 PM By: [name] RN Entered By: [name] on 02/17/2022 10:02:32 [place], [name] ([id number]) [id number] Page 4 of 7 -------------------------------------------------------------------------------- Problem List Details Patient Name:  Date of Service: ISAMAR, [place] 02/17/2022 9:45 [name] Medical Record Number: [id number] Patient Account Number: [id number] Date of Birth/Sex: Treating RN: 1929-03-28 (88 y.o. 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 02/17/2022 9:45 [name] Medical Record Number: [id number] Patient Account Number: [id number] Date of Birth/Sex: Treating RN: 1929/11/20 (88 y.o. [name] Primary Care Provider: [name] Other Clinician: Referring Provider: Treating Provider/Extender: [name] Weeks in Treatment: [place], [place] ([id number]) [id number] Page 5 of 7 Chief Complaint Information obtained from Patient Right LE Ulcer History of Present Illness (HPI) ADMISSION 10/03/2021 This is [name] 88 year old man who was pulling [name] cart 3 months ago he had some sort of imbalance issue hitting his left anterior lower leg. He is not sure exactly what he hit. He is able to show me photos on his phone that show [name] laceration on the left anterior lower leg in an inverted L-shaped and. He initially  did very little to this himself. Saw his primary doctor on 09/21/2021 [place] instructed him to put Vaseline gauze on this which he did for short period of time. He says that things are getting [name] lot smaller and are improving he has not really been treating this with anything. The other major problem the patient has is peripheral arterial disease. He is followed by vein and vascular and has had [name] series of follow-up arterial studies. Most recently this was done on 09/30/2021 showing on the left and ABI of 0.49 with [name] great toe pressure of 0.47 and monophasic waveforms. This is somewhat worse than test he had done on 03/21/2021 at which time the ABI was 0.68 TBI of 0.51. I think because of his age and the fact he has underlying stage IIIa chronic kidney disease and the fact the wound is healing they have not been scheduling him for anything aggressive but follow-up studies are apparently booked for 50-month Past medical history includes coronary artery disease, lumbar radiculopathy, COPD which is limiting and stage IIIa chronic renal failure. He has PAD as noted. He also tells me that he had [name] surgical procedure for essential tremor which left him with some mild dysarthria and gait and balance problems 10-10-2021 upon evaluation today patient appears to be doing decently well in regard to his wound all things considered. He is very slow to heal due to his low ABIs. With that being said it does appear that he is making some progress nonetheless there is little bit of dry skin and tissue around the edges of the wound I think [name] very light debridement to clear this  away could be of benefit for him. The patient is in agreement with that plan. For that reason I did actually perform debridement to clear that away today but did so extremely carefully. 10-17-2021 upon evaluation today patient appears to be doing well currently in regard to his wound in fact this appears to be completely healed based on what I am  seeing today. I do not see any evidence of active infection locally or systemically which is great news. No fevers, chills, nausea, vomiting, or diarrhea. Readmission: 01-13-2022 patient presents today for follow-up evaluation although the last time I saw him was actually August of this year. At that time he actually was doing decently well with regard to the wound on the left leg. Unfortunately however it was noted that he had noted poor ABIs and again I somehow missed documented the right side at the previous admission but his right ABI was 0.61 with [name] TBI of 0.38. Based on what I am seeing today and considering his arterial flow which is not very good I do believe that he likely needs to be seen by vascular soon as possible I think that is contributing to his poor healing in regard to this right leg. I discussed that with him today and the individual who I am assuming to be [name] family member that was present with him as well although I did not inquire in order to confirm. 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 He subsequently had an injury where he sustained [name] laceration this was on November 24, 2021. He then ended up with [name] need for reopening this with incision and drainage on 11-29-2021. I did look through the pictures that his family member had as well from the beginning through now and there has been some improvement though [name] lot of the granulation tissue is somewhat hypergranular there is also some evidence that the bone is drying out of the central portion of the wound. Unfortunately I do not think that this is healing nearly as well as I would like to see. I believe his blood flow is affecting this significantly and I think that needs to be addressed ASAP. Otherwise the patient's past medical history really has not changed significantly since the last time I saw him. That can be referenced above. 11/10; second visit for this wound on the right anterior upper  tibia. As I understand things he initially had [name] fall while putting something in his attic. Initially the wound was sutured but became secondarily infected and had to be opened by general surgery culturing MSSA. He was followed by Dr. Steva Ready of infectious disease and is completed antibiotics [Keflex]. He has completed this. Currently we are using Xeroform with backing Dakin's changing this daily. He has formal arterial studies ordered for vein and vascular next Wednesday apparently in response to [name] ABI of 0.6 Patient is asking about Medihoney which they apparently have in the [facility] clinic. I cannot pick up any claudication by history. 01-27-2022 upon evaluation today patient's wound is doing [name] little bit better and overall appearance fortunately there does not appear to be any signs of infection locally or systemically at this time which is great news and overall I am extremely pleased with where we stand. No fevers, chills, nausea, vomiting, or diarrhea. He did have his arterial study which I did review today. He is ABI on the right was 0.84 with [name] TBI of 0.97 on the left was 0.62 with [name] TBI of  0.75. This actually indicates fairly good blood flow all things considered. Fortunately I do not see any evidence of significant infection at this point as well which is also good news. 02-10-2022 upon evaluation today patient appears to be doing well currently in regard to his wound. He is actually been tolerating the dressing changes without complication. Fortunately I do not see any signs of infection which is great news. With that being said I see [name] lot of new granulation tissue and I am actually extremely pleased with where we stand compared to last time I saw him I think that there is [name] lot of new growth over top of the bone area which is excellent news that is exactly what we wanted to see. I think there is probably only about 30% exposure of the bone compared to last time I saw him. 02-17-2022  upon evaluation today patient appears to be doing well currently in regard to his wound. He is slowly showing signs of covering over this bone which is good the tissue is [name] little hyper granulated but nonetheless does seem to be doing better than previous. Objective Constitutional Well-nourished and well-hydrated in no acute distress. Vitals Time Taken: 9:50 AM, Height: 75 in, Weight: 168 lbs, BMI: 21, Temperature: 97.6 F, Pulse: 66 bpm, Respiratory Rate: 18 breaths/min, Blood Pressure: 144/85 mmHg. Respiratory normal breathing without difficulty. Psychiatric this patient is able to make decisions and demonstrates good insight into disease process. Alert and Oriented x 3. pleasant and cooperative. General Notes: Upon inspection patient's wound bed actually showed signs of good granulation epithelization at this point. Fortunately I do not see any JORDY, VERBA [name] ([id number]) [id number] Page 6 of 7 evidence of infection locally nor systemically which is great news and overall I am extremely pleased with where we stand. Integumentary (Hair, Skin) Wound #2 status is Open. Original cause of wound was Laceration. The date acquired was: 11/24/2021. The wound has been in treatment 5 weeks. The wound is located on the Right,Midline,Anterior Lower Leg. The wound measures 4.5cm length x 1.5cm width x 0.4cm depth; 5.301cm^2 area and 2.121cm^3 volume. There is bone and Fat Layer (Subcutaneous Tissue) exposed. There is no tunneling or undermining noted. There is [name] medium amount of serosanguineous drainage noted. There is small (1-33%) red, hyper - granulation within the wound bed. There is [name] small (1-33%) amount of necrotic tissue within the wound bed including Adherent Slough and Necrosis of Bone. 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 - patient seen at [facility] Fax [phone] Bathing/ Shower/ Hygiene: Wash wounds with antibacterial soap and water. May shower; gently cleanse wound with antibacterial soap, rinse and pat dry prior to dressing wounds Anesthetic (Use 'Patient Medications' Section for Anesthetic Order Entry): Lidocaine applied to wound bed Edema Control - Lymphedema / Segmental Compressive Device / Other: Elevate, Exercise Daily and Avoid Standing for Long Periods of Time. Elevate legs to the level of the heart and pump ankles as often as possible Elevate leg(s) parallel to the floor when sitting. WOUND #2: - Lower Leg Wound Laterality: Right, Midline, Anterior Cleanser: Soap and Water Every Other Day/30 Days Discharge Instructions: Gently cleanse wound with antibacterial soap, rinse and pat dry prior to dressing wounds Prim Dressing: Prisma 4.34 (in) Every Other Day/30 Days ary Discharge  Instructions: Moisten w/normal saline or sterile water; Cover wound as directed. Do not remove from wound bed. Secondary Dressing: Xeroform Petrolatum Gauze, Overwrap 1x8 (in/in) Every Other Day/30 Days Secured With: Kerlix Roll Sterile or Non-Sterile 6-ply 4.5x4 (yd/yd) Every Other Day/30 Days Discharge Instructions: Apply Kerlix as directed Secured With: Tubigrip Size D, 3x10 (in/yd) Every Other Day/30 Days Discharge Instructions: single layer 1. I am going to suggest that we have the patient continue to monitor for any signs of infection or worsening. Based on what I see I do believe that we are making good progress here. 2. I am also can recommend that we continue with the silver collagen followed by Xeroform gauze to cover which she is doing excellent. We will see patient back for reevaluation in 1 week here in the clinic. If anything worsens or changes patient will contact our office for  additional recommendations. Electronic Signature(s) Signed: 02/17/2022 11:11:22 AM By: Worthy Keeler PA-C Previous Signature: 02/17/2022 10:06:24 AM Version By: Worthy Keeler PA-C Entered By: Worthy Keeler on 02/17/2022 11:11:22 -------------------------------------------------------------------------------- SuperBill Details Patient Name: Date of Service: [name] 02/17/2022 Medical Record Number: [id number] Patient Account Number: [id number] NIK, GORRELL ([id number]) [id number] Page 7 of 7 Date of Birth/Sex: Treating RN: August 14, 1929 (88 y.o. 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 Facility Procedures : CPT4 Code: [id number] Description: [phone] - WOUND CARE VISIT-LEV 2 EST PT Modifier: Quantity: 1 Physician Procedures : CPT4 Code Description Modifier [id number] - WC PHYS LEVEL 3 - EST PT ICD-10 Diagnosis Description S80.811A Abrasion, right lower leg, initial encounter L97.812 Non-pressure chronic ulcer of other part of right lower leg with fat layer exposed  I70.239 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

## 2022-02-20 ENCOUNTER — Ambulatory Visit: Payer: PPO | Admitting: Speech Pathology

## 2022-02-22 ENCOUNTER — Ambulatory Visit: Payer: PPO | Attending: Internal Medicine | Admitting: Speech Pathology

## 2022-02-22 ENCOUNTER — Other Ambulatory Visit: Payer: Self-pay | Admitting: Internal Medicine

## 2022-02-22 ENCOUNTER — Encounter: Payer: Self-pay | Admitting: Speech Pathology

## 2022-02-22 DIAGNOSIS — R471 Dysarthria and anarthria: Secondary | ICD-10-CM

## 2022-02-22 DIAGNOSIS — G3184 Mild cognitive impairment, so stated: Secondary | ICD-10-CM | POA: Diagnosis not present

## 2022-02-22 DIAGNOSIS — R41841 Cognitive communication deficit: Secondary | ICD-10-CM | POA: Insufficient documentation

## 2022-02-22 DIAGNOSIS — R4189 Other symptoms and signs involving cognitive functions and awareness: Secondary | ICD-10-CM

## 2022-02-22 DIAGNOSIS — R479 Unspecified speech disturbances: Secondary | ICD-10-CM | POA: Diagnosis not present

## 2022-02-22 DIAGNOSIS — G309 Alzheimer's disease, unspecified: Secondary | ICD-10-CM | POA: Diagnosis not present

## 2022-02-22 DIAGNOSIS — F0283 Dementia in other diseases classified elsewhere, unspecified severity, with mood disturbance: Secondary | ICD-10-CM | POA: Insufficient documentation

## 2022-02-22 NOTE — Therapy (Signed)
OUTPATIENT SPEECH LANGUAGE PATHOLOGY  EVALUATION   Patient Name: Theodore [name] MRN: [id number] DOB:18-Sep-1929, 88 y.o., male Today's Date: 02/22/2022  PCP: [name], MD REFERRING PROVIDER: [name], MD   End of Session - 02/22/22 1257     Visit Number 1    Number of Visits 25    Date for SLP Re-Evaluation 05/17/22    Authorization Type [employer]    Progress Note Due on Visit 10    SLP Start Time 0900    SLP Stop Time  1000    SLP Time Calculation (min) 60 min    Activity Tolerance Patient tolerated treatment well             No past medical history on file.  The histories are not reviewed yet. Please review them in the "History" navigator section and refresh this [place]. 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 ([place]) 11/28/2021   Dyslipidemia 11/28/2021   GERD without esophagitis 11/28/2021   Wound infection, posttraumatic 09/22/2021   Proximal muscle weakness 08/27/2021   Speech disturbance 08/26/2021   Adverse drug reaction, initial encounter 05/04/2021   Lumbar radiculopathy 04/25/2021   Thoracic aortic atherosclerosis ([place]) 01/17/2021   Dizziness 12/31/2020   Anxiety in acute stress reaction 12/07/2020   Mild cognitive impairment with memory loss 11/30/2020   Elevated PSA, between 10 and less than 20 ng/ml 11/30/2020   Chronic kidney disease, stage 3b ([place]) 11/10/2020   Benign hypertensive kidney disease with chronic kidney disease 11/10/2020   COVID-19 virus infection 04/06/2020   Acquired trigger finger of left middle finger 08/07/2019   Carpal tunnel syndrome of right wrist 07/10/2019   Ulnar neuropathy 06/05/2019   Disorder of bursae of shoulder region 01/29/2019   Localized, primary osteoarthritis of shoulder region 01/29/2019   Localized, secondary osteoarthritis of the shoulder region  01/29/2019   Osteoarthritis of knee 01/29/2019   Shoulder joint pain 01/29/2019   Chronic obstructive pulmonary disease ([place]) 09/24/2018   Grief 09/02/2018   Cervical radiculopathy 08/14/2018   PVC (premature ventricular contraction) 03/21/2018   PAD (peripheral artery disease) ([facility])    Anterior knee pain, left 12/26/2015   Hypothyroidism 07/04/2014   Nonallergic vasomotor rhinitis 07/01/2014   Hypertrophy of prostate with urinary obstruction and other lower urinary tract symptoms (LUTS) 08/08/2013   Low serum testosterone level 04/28/2013   Actinic keratosis 05/10/2010   History of malignant melanoma of skin 05/10/2010    ONSET DATE: date of referrals 02/01/2022 and 02/22/2022   REFERRING DIAG: R47.1 (ICD-10-CM) - Dysarthria R41.89 (ICD-10-CM) - Cognitive decline   THERAPY DIAG:  Speech disturbance, unspecified type  Cognitive communication deficit  Rationale for Evaluation and Treatment Rehabilitation  SUBJECTIVE:   SUBJECTIVE STATEMENT: Pt known to this writer from previous treatment  Pt accompanied by: significant other per SLP request  PERTINENT HISTORY: Pt with recent course of Outpatient ST services same deficits (08/26/2021 and 09/07/2021).  [name]    07/21/2019 - pt underwent MRI guided focused ultrasound thalamotomy LEFT sided for treatment of essential tremor of RIGHT arm   07/30/2021 - pt underwent MRI guided focused ultrasound thalamotomy RIGHT sided for treatment of essential tremor of LEFT arm     DIAGNOSTIC FINDINGS:   MRi 07/05/2021 consistent with recent MR guided focus ultrasound ablation in the right subthalamic region A focus of diffusion restriction right subthalamic region measures 9 x 6 mm  MBSS 2017   MBSS 11/04/2021 Pt presents with overall adequate oropharyngeal abilities when consuming puree, soft solids, thin liquids via spoon and cup as well as whole barium tablet with thin liquids. Pt's swallow function doesn't appear any different  than documented in his previous MBSS (2017) which stated, "Thin liquid- small amount of laryngeal penetration with rapid successive swallowing maneuvers. With single swallowing maneuvers no laryngeal penetration is observed. As well in this previous study, pt continues to demonstrate penetration with trace aspiration when consuming successive swallows. Pt demonstrated understanding need to take single sips as he stated that he doesn't consume consecutive sips at baseline. At this time, pt presents with adequate oropharyngeal abilities and reduced risk of aspiration when consuming regular diet with thin liquids via single cup sips and medicine whole with thin liquids. Skilled ST intervention is not indicated.    PAIN:  Are you having pain? No   FALLS: Has patient fallen in last 6 months?  Yes  LIVING ENVIRONMENT: Lives with: lives with their spouse Lives in: House/apartment  PLOF:  Level of assistance: Needed assistance with ADLs, Needed assistance with IADLS Employment: Retired   PATIENT GOALS would like improved speech and swallowing  OBJECTIVE:   COGNITIVE COMMUNICATION Overall cognitive status: Impaired Areas of impairment:  Attention: Impaired: Sustained Memory: Impaired: Immediate Working Short term Production designer, theatre/television/film Awareness: Impaired: Intellectual, Emergent, and Anticipatory Executive function: Impaired: Comment: all impaired by lower level deficits Behavior: Impulsive Auditory comprehension: WFL Verbal expression: WFL Functional communication: WFL   AUDITORY COMPREHENSION: Overall auditory comprehension: Appears intact YES/NO questions: IMPAIRED d/t cognitive deficits Following directions: IMPAIRED d/t cognitive deficits Conversation: Simple Interfering components: attention, awareness, processing speed, and working memory Effective technique: repetition/stressing words   READING COMPREHENSION: N/A  EXPRESSION: verbal  VERBAL EXPRESSION: Level  of generative/spontaneous verbalization: sentence Automatic speech: name: intact and social response: intact  Repetition: Appears intact Naming:  Appears intact Pragmatics: Appears intact Interfering components: attention and speech intelligibility Effective technique:  cognitive cuing Non-verbal means of communication: N/A   WRITTEN EXPRESSION: Dominant hand: right  Written expression: Not tested  MOTOR SPEECH: Overall motor speech: impaired Level of impairment: Word Respiration: thoracic breathing Phonation: normal Resonance: WFL Articulation: Impaired: word Intelligibility: Intelligibility reduced Motor planning: Impaired: aware, unaware, and consistent Motor speech errors: aware, unaware, and consistent Interfering components:  bilateral   thalamotomy Effective technique: increased vocal intensity  ORAL MOTOR EXAMINATION Facial : WFL Lingual: WFL Velum: WFL Mandible: WFL Cough: WFL Voice: WFL   STANDARDIZED ASSESSMENTS: The "[place] Mental Status" (SLUMS) Examination was administered. Pt scored **/30, raising concern for the presence of a neurocognitive disorder. Further testing would be beneficial, as deficits of attention, memory, oriantion, problem solving, and executive functions identified today may negatively impact pt safety with independent living.   SLUMS Examination Orientation  3/3  Numeric Problem Solving  0/3  Memory  1/5  Attention 2/2  Thought Organization 2/3  Clock Drawing 0/4  Visuospatial Skills               2/2  Short Story Recall  2/8  Total  12/30     Scoring  High School Education  Less than High School Education   Normal  27-30 25-30  Mild Neurocognitive Disorder 21-26 20-24  Dementia  1-20 1-19     PATIENT REPORTED OUTCOME MEASURES (PROM): Will give in next session   TODAY'S TREATMENT:  N/A   PATIENT EDUCATION: Education details: motor speech vs dysphonia; appearance of progressive cognitive decline  Person  educated: Patient and Spouse Education method: Explanation and Verbal cues Education comprehension: needs further education     GOALS: Goals reviewed with patient? Yes  SHORT TERM GOALS: Target date: 10 sessions  Patient will demonstrate understanding of functional cognitive activities for home maintenance program with moderate assistance Baseline: Maximal Goal status: INITIAL  2.  Given moderate assistance, patient will demonstrate knowledge of appropriate activities to support cognitive and  language function outside of ST with assistance from family.  Baseline: Maximal Goal status: INITIAL  3.  Patient will report engagement in cognitive activities outside of ST 4/7 days.  Baseline:  Goal status: INITIAL  4.  Pt will verbalize and demonstrate how to implement 1 memory and attention strategies to aid daily functioning with moderate A over 2 sessions  Baseline: currently not using any strategies Goal status: INITIAL   LONG TERM GOALS: Target date: 05/17/2022 Patient will demonstrate understanding of functional cognitive activities for home maintenance program with minimal assistance Baseline: maximal Goal status: INITIAL    ASSESSMENT:  CLINICAL IMPRESSION: Patient is a 88 y.o. male who was seen today for a formal evaluation of dysarthria and cognitive communication deficits.    Today he presents for evaluation of chronic complaints related to speech production and swallowing. Pt's wife accompanied pt per SLP request d/t observation of cognitive deficits over previous course of Outpatient ST.  Pt with recent course of Outpatient ST that was not succssful d/t deficits in memory and age related pain that resulted in inability to attend sessions.   Pt reports being bothered by his "voice" as well as accumulation of salvia in his mouth that results in him "drooling" onto objects when he "leans over." In addition, he reports increased dysphagia c/b coughing especially when  talking or eating in social settings. Pt reports that his fear of coughing results in decreased conversation and socialization with others. Pt and his wife also provide that OT at [facility] recently completed a "driving evluation" with recommendation that pt cease driving at this time. Will request copy of that evaluation.   During this evaluation, pt's speech intelligibility was limited d/t motor speech and not dysphonia. Currently pt presents with reduced movement of articulators that appears stable since last evaluation. These symptoms are likely related to second thalamotomy. As such prognosis for improvement is guarded.   During the initial portion of this evaluation, pt was noted to have had a decline in cognitive abilities when compared to last course of ST. 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 Pt would benefit from formal assessment of cognition and compensatory strategies.    OBJECTIVE IMPAIRMENTS include attention, memory, awareness, executive functioning, dysarthria, and motor speech . These impairments are limiting patient from managing medications, managing appointments, managing finances, household responsibilities, effectively communicating at home and in community, and safety when swallowing. Factors affecting potential to achieve goals and functional outcome are ability to learn/carryover information, co-morbidities, medical prognosis, previous level of function, and severity of impairments.. Patient will benefit from skilled SLP services to address above impairments and improve overall function.  REHAB POTENTIAL: Poor d/t bilateral thalamotomy  PLAN: SLP FREQUENCY: 1-2x/week  SLP DURATION: 8 weeks  PLANNED INTERVENTIONS: Aspiration precaution training, Environmental controls, Cueing hierachy, Cognitive reorganization, Internal/external aids, Functional tasks, SLP instruction and feedback, Compensatory strategies, and  Patient/family education

## 2022-02-23 ENCOUNTER — Encounter: Payer: PPO | Admitting: Speech Pathology

## 2022-02-24 DIAGNOSIS — M1712 Unilateral primary osteoarthritis, left knee: Secondary | ICD-10-CM | POA: Diagnosis not present

## 2022-02-24 DIAGNOSIS — M9901 Segmental and somatic dysfunction of cervical region: Secondary | ICD-10-CM | POA: Diagnosis not present

## 2022-02-24 DIAGNOSIS — M9902 Segmental and somatic dysfunction of thoracic region: Secondary | ICD-10-CM | POA: Diagnosis not present

## 2022-02-24 DIAGNOSIS — M9903 Segmental and somatic dysfunction of lumbar region: Secondary | ICD-10-CM | POA: Diagnosis not present

## 2022-02-24 DIAGNOSIS — M9904 Segmental and somatic dysfunction of sacral region: Secondary | ICD-10-CM | POA: Diagnosis not present

## 2022-02-27 ENCOUNTER — Encounter: Payer: PPO | Admitting: Physician Assistant

## 2022-02-27 ENCOUNTER — Encounter: Payer: PPO | Admitting: Speech Pathology

## 2022-02-27 DIAGNOSIS — S80811A Abrasion, right lower leg, initial encounter: Secondary | ICD-10-CM | POA: Diagnosis not present

## 2022-02-27 DIAGNOSIS — L97816 Non-pressure chronic ulcer of other part of right lower leg with bone involvement without evidence of necrosis: Secondary | ICD-10-CM | POA: Diagnosis not present

## 2022-02-27 NOTE — Progress Notes (Signed)
CLIF, SERIO A ([id number]) [id number] Page 1 of 7 Visit Report for 02/27/2022 Chief Complaint Document Details Patient Name: Date of Service: Theodore Theodore [name], Theodore Theodore [name] 02/27/2022 9:45 A M Medical Record Number: [id number] Patient Account Number: [id number] Date of Birth/Sex: Treating RN: [birth year] (88 y.o. Theodore Theodore [name]) [name] Primary Care Provider: [name] Other Clinician: Massie Kluver Referring Provider: Treating Provider/Extender: [name] in Treatment: 6 Information Obtained from: Patient Chief Complaint Right LE Ulcer Electronic Signature(s) Signed: 02/27/2022 10:04:02 AM By: Worthy Keeler PA-C Entered By: Worthy Keeler on 02/27/2022 10:04:02 -------------------------------------------------------------------------------- HPI Details Patient Name: Date of Service: [name], [name] 02/27/2022 9:45 A M Medical Record Number: [id number] Patient Account Number: [id number] Date of Birth/Sex: Treating RN: 16-Mar-1929 (88 y.o. Theodore Theodore [name] Primary Care Provider: [name] Other Clinician: Massie Kluver Referring Provider: Treating Provider/Extender: [name] Weeks in Treatment: 6 History of Present Illness HPI Description: ADMISSION 10/03/2021 This is a 88 year old man who was pulling a cart 3 months ago he had some sort of imbalance issue hitting his left anterior lower leg. He is not sure exactly what he hit. He is able to show me photos on his phone that show a laceration on the left anterior lower leg in an inverted L-shaped and. He initially did very little to this himself. Saw his primary doctor on 09/21/2021 [place] instructed him to put Vaseline gauze on this which he did for short period of time. He says that things are getting a lot smaller and are improving he has not really been treating this with anything. The other major problem the patient has is peripheral arterial disease. He is followed by  vein and vascular and has had a series of follow-up arterial studies. Most recently this was done on 09/30/2021 showing on the left and ABI of 0.49 with a great toe pressure of 0.47 and monophasic waveforms. This is somewhat worse than test he had done on 03/21/2021 at which time the ABI was 0.68 TBI of 0.51. I think because of his age and the fact he has underlying stage IIIa chronic kidney disease and the fact the wound is healing they have not been scheduling him for anything aggressive but follow-up studies are apparently booked for 51-month Past medical history includes coronary artery disease, lumbar radiculopathy, COPD which is limiting and stage IIIa chronic renal failure. He has PAD as noted. He also tells me that he had a surgical procedure for essential tremor which left him with some mild dysarthria and gait and balance problems SSKIPPER, DACOSTAA ([id number] Page 2 of 7 10-10-2021 upon evaluation today patient appears to be doing decently well in regard to his wound all things considered. He is very slow to heal due to his low ABIs. With that being said it does appear that he is making some progress nonetheless there is little bit of dry skin and tissue around the edges of the wound I think a very light debridement to clear this away could be of benefit for him. The patient is in agreement with that plan. For that reason I did actually perform debridement to clear that away today but did so extremely carefully. 10-17-2021 upon evaluation today patient appears to be doing well currently in regard to his wound in fact this appears to be completely healed based on what I am seeing today. I do not see any evidence of active infection locally or systemically which is great news. No fevers, chills, nausea, vomiting, or  diarrhea. Readmission: 01-13-2022 patient presents today for follow-up evaluation although the last time I saw him was actually August of this year.  At that time he actually was doing decently well with regard to the wound on the left leg. Unfortunately however it was noted that he had noted poor ABIs and again I somehow missed documented the right side at the previous admission but his right ABI was 0.61 with a TBI of 0.38. Based on what I am seeing today and considering his arterial flow which is not very good I do believe that he likely needs to be seen by vascular soon as possible I think that is contributing to his poor healing in regard to this right leg. I discussed that with him today and the individual who I am assuming to be a family member that was present with him as well although I did not inquire in order to confirm. 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 He subsequently had an injury where he sustained a laceration this was on November 24, 2021. He then ended up with a need for reopening this with incision and drainage on 11-29-2021. I did look through the pictures that his family member had as well from the beginning through now and there has been some improvement though a lot of the granulation tissue is somewhat hypergranular there is also some evidence that the bone is drying out of the central portion of the wound. Unfortunately I do not think that this is healing nearly as well as I would like to see. I believe his blood flow is affecting this significantly and I think that needs to be addressed ASAP. Otherwise the patient's past medical history really has not changed significantly since the last time I saw him. That can be referenced above. 11/10; second visit for this wound on the right anterior upper tibia. As I understand things he initially had a fall while putting something in his attic. Initially the wound was sutured but became secondarily infected and had to be opened by general surgery culturing MSSA. He was followed by Dr. Steva Ready of infectious disease and is completed antibiotics  [Keflex]. He has completed this. Currently we are using Xeroform with backing Dakin's changing this daily. He has formal arterial studies ordered for vein and vascular next Wednesday apparently in response to a ABI of 0.6 Patient is asking about Medihoney which they apparently have in the [facility] clinic. I cannot pick up any claudication by history. 01-27-2022 upon evaluation today patient's wound is doing a little bit better and overall appearance fortunately there does not appear to be any signs of infection locally or systemically at this time which is great news and overall I am extremely pleased with where we stand. No fevers, chills, nausea, vomiting, or diarrhea. He did have his arterial study which I did review today. He is ABI on the right was 0.84 with a TBI of 0.97 on the left was 0.62 with a TBI of 0.75. This actually indicates fairly good blood flow all things considered. Fortunately I do not see any evidence of significant infection at this point as well which is also good news. 02-10-2022 upon evaluation today patient appears to be doing well currently in regard to his wound. He is actually been tolerating the dressing changes without complication. Fortunately I do not see any signs of infection which is great news. With that being said I see a lot of new granulation tissue and I am actually  extremely pleased with where we stand compared to last time I saw him I think that there is a lot of new growth over top of the bone area which is excellent news that is exactly what we wanted to see. I think there is probably only about 30% exposure of the bone compared to last time I saw him. 02-17-2022 upon evaluation today patient appears to be doing well currently in regard to his wound. He is slowly showing signs of covering over this bone which is good the tissue is a little hyper granulated but nonetheless does seem to be doing better than previous. 02-28-2022 upon evaluation today patient  appears to be doing excellent in regard to his wound. There are still some hypergranulation but to be honest this has dramatically improved compared to where it was previous. In fact the hypergranulation has been welcomed as this actually helped to cover the bone which was previously exposed there is just a tiny area is still exposed now and overall I think were very close to complete covering. Once we get that covered my plan is to switch to [facility]. Electronic Signature(s) Signed: 02/27/2022 10:29:01 AM By: Worthy Keeler PA-C Entered By: Worthy Keeler on 02/27/2022 10:29:00 -------------------------------------------------------------------------------- Physical Exam Details Patient Name: Date of Service: [place], [name] 02/27/2022 9:45 A M Medical Record Number: [id number] Patient Account Number: [id number] Date of Birth/Sex: Treating RN: [birth year] (88 y.o. Theodore Theodore [name] Primary Care Provider: [name] Other Clinician: Massie Kluver Referring Provider: Treating Provider/Extender: [name] Weeks in Treatment: 6 Constitutional Well-nourished and well-hydrated in no acute distress. Respiratory normal breathing without difficulty. Psychiatric this patient is able to make decisions and demonstrates good insight into disease process. Alert and Oriented x 3. pleasant and cooperative. FURIOUS, CHIARELLI A ([id number]) [id number] Page 3 of 7 Notes Upon inspection patient's wound bed actually showed signs of good granulation epithelization at this point. Fortunately I do not see any evidence of active infection locally or systemically which is great news and overall I am extremely pleased with where we stand. No fevers, chills, nausea, vomiting, or diarrhea. Electronic Signature(s) Signed: 02/27/2022 10:30:19 AM By: Worthy Keeler PA-C Entered By: Worthy Keeler on 02/27/2022  10:30:19 -------------------------------------------------------------------------------- Physician Orders Details Patient Name: Date of Service: [place]gr[place], [name] 02/27/2022 9:45 A M Medical Record Number: [id number] Patient Account Number: [id number] Date of Birth/Sex: Treating RN: 04/30/1929 (88 y.o. 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 - patient seen at [facility] Fax [phone] Return A Bathing/ Shower/ Hygiene Wash wounds with antibacterial soap and water. May shower; gently cleanse wound with antibacterial soap, rinse and pat dry prior to dressing wounds Anesthetic (Use 'Patient Medications' Section for Anesthetic Order Entry) Lidocaine applied to wound bed Edema Control - Lymphedema / Segmental Compressive Device / Other Elevate, Exercise Daily and A void Standing for Long Periods of Time. Elevate legs to the level of the heart and pump ankles as often as possible Elevate leg(s) parallel to the floor when sitting. Wound Treatment Wound #2 - Lower Leg Wound Laterality: Right, Midline, Anterior  Cleanser: Soap and Water Every Other Day/30 Days Discharge Instructions: Gently cleanse wound with antibacterial soap, rinse and pat dry prior to dressing wounds Prim Dressing: Prisma 4.34 (in) Every Other Day/30 Days ary Discharge Instructions: Moisten w/normal saline or sterile water; Cover wound as directed. Do not remove from wound bed. Secondary Dressing: Xeroform Petrolatum Gauze,  Overwrap 1x8 (in/in) Every Other Day/30 Days Secured With: Kerlix Roll Sterile or Non-Sterile 6-ply 4.5x4 (yd/yd) Every Other Day/30 Days Discharge Instructions: Apply Kerlix as directed Secured With: Tubigrip Size D, 3x10 (in/yd) Every Other Day/30 Days Discharge Instructions: single layer Electronic Signature(s) Jester, Yeiden A ([id number]) [id number] Page 4 of 7 Signed: 02/27/2022 4:45:38 PM By: Worthy Keeler PA-C Signed: 02/28/2022 4:44:06 PM By: Massie Kluver Entered By: Massie Kluver on 02/27/2022 10:22:28 -------------------------------------------------------------------------------- Problem List Details Patient Name: Date of Service: Jhaveri, [name] 02/27/2022 9:45 A M Medical Record Number: [id number] Patient Account Number: [id number] Date of Birth/Sex: Treating RN: [birth year] (88 y.o. 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 02/27/2022 9:45 A M Medical Record Number: [id number] Patient Account Number: [id number] Date of Birth/Sex: Treating RN: 05-24-1929 (88 y.o. Theodore Theodore [name] Primary Care Provider: [name] Other Clinician:  Massie Kluver Referring Provider: Treating Provider/Extender: Mcarthur, Ivins A ([id number]) [id number] Page 5 of 7 Weeks in Treatment: 6 Subjective Chief Complaint Information obtained from Patient Right LE Ulcer History of Present Illness (HPI) ADMISSION 10/03/2021 This is a 88 year old man who was pulling a cart 3 months ago he had some sort of imbalance issue hitting his left anterior lower leg. He is not sure exactly what he hit. He is able to show me photos on his phone that show a laceration on the left anterior lower leg in an inverted L-shaped and. He initially did very little to this himself. Saw his primary doctor on 09/21/2021 [place] instructed him to put Vaseline gauze on this which he did for short period of time. He says that things are getting a lot smaller and are improving he has not really been treating this with anything. The other major problem the patient has is peripheral arterial disease. He is followed by vein and vascular and has had a series of follow-up arterial studies. Most recently this was done on 09/30/2021 showing on the left and ABI of 0.49 with a great toe pressure of 0.47 and monophasic waveforms. This is somewhat worse than test he had done on 03/21/2021 at which time the ABI was 0.68 TBI of 0.51. I think because of his age and the fact he has underlying stage IIIa chronic kidney disease and the fact the wound is healing they have not been scheduling him for anything aggressive but follow-up studies are apparently booked for 86-month Past medical history includes coronary artery disease, lumbar radiculopathy, COPD which is limiting and stage IIIa chronic renal failure. He has PAD as noted.  He also tells me that he had a surgical procedure for essential tremor which left him with some mild dysarthria and gait and balance problems 10-10-2021 upon evaluation today patient appears to be doing decently well in  regard to his wound all things considered. He is very slow to heal due to his low ABIs. With that being said it does appear that he is making some progress nonetheless there is little bit of dry skin and tissue around the edges of the wound I think a very light debridement to clear this away could be of benefit for him. The patient is in agreement with that plan. For that reason I did actually perform debridement to clear that away today but did so extremely carefully. 10-17-2021 upon evaluation today patient appears to be doing well currently in regard to his wound in fact this appears to be completely healed based on what I am seeing today. I do not see any evidence of active infection locally or systemically which is great news. No fevers, chills, nausea, vomiting, or diarrhea. Readmission: 01-13-2022 patient presents today for follow-up evaluation although the last time I saw him was actually August of this year. At that time he actually was doing decently well with regard to the wound on the left leg. Unfortunately however it was noted that he had noted poor ABIs and again I somehow missed documented the right side at the previous admission but his right ABI was 0.61 with a TBI of 0.38. Based on what I am seeing today and considering his arterial flow which is not very good I do believe that he likely needs to be seen by vascular soon as possible I think that is contributing to his poor healing in regard to this right leg. I discussed that with him today and the individual who I am assuming to be a family member that was present with him as well although I did not inquire in order to confirm. 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 He subsequently had an injury where he sustained a laceration this was on November 24, 2021. He then ended up with a need for reopening this with incision and drainage on 11-29-2021. I did look through the pictures that his family member  had as well from the beginning through now and there has been some improvement though a lot of the granulation tissue is somewhat hypergranular there is also some evidence that the bone is drying out of the central portion of the wound. Unfortunately I do not think that this is healing nearly as well as I would like to see. I believe his blood flow is affecting this significantly and I think that needs to be addressed ASAP. Otherwise the patient's past medical history really has not changed significantly since the last time I saw him. That can be referenced above. 11/10; second visit for this wound on the right anterior upper tibia. As I understand things he initially had a fall while putting something in his attic. Initially the wound was sutured but became secondarily infected and had to be opened by general surgery culturing MSSA. He was followed by Dr. Steva Ready of infectious disease and is completed antibiotics [Keflex]. He has completed this. Currently we are using Xeroform with backing Dakin's changing this daily. He has formal arterial studies ordered for vein and vascular next Wednesday apparently in response to a ABI of 0.6 Patient is asking about Medihoney which they apparently have in the [facility] clinic. I  cannot pick up any claudication by history. 01-27-2022 upon evaluation today patient's wound is doing a little bit better and overall appearance fortunately there does not appear to be any signs of infection locally or systemically at this time which is great news and overall I am extremely pleased with where we stand. No fevers, chills, nausea, vomiting, or diarrhea. He did have his arterial study which I did review today. He is ABI on the right was 0.84 with a TBI of 0.97 on the left was 0.62 with a TBI of 0.75. This actually indicates fairly good blood flow all things considered. Fortunately I do not see any evidence of significant infection at this point as well which is also good  news. 02-10-2022 upon evaluation today patient appears to be doing well currently in regard to his wound. He is actually been tolerating the dressing changes without complication. Fortunately I do not see any signs of infection which is great news. With that being said I see a lot of new granulation tissue and I am actually extremely pleased with where we stand compared to last time I saw him I think that there is a lot of new growth over top of the bone area which is excellent news that is exactly what we wanted to see. I think there is probably only about 30% exposure of the bone compared to last time I saw him. 02-17-2022 upon evaluation today patient appears to be doing well currently in regard to his wound. He is slowly showing signs of covering over this bone which is good the tissue is a little hyper granulated but nonetheless does seem to be doing better than previous. 02-28-2022 upon evaluation today patient appears to be doing excellent in regard to his wound. There are still some hypergranulation but to be honest this has dramatically improved compared to where it was previous. In fact the hypergranulation has been welcomed as this actually helped to cover the bone which was previously exposed there is just a tiny area is still exposed now and overall I think were very close to complete covering. Once we get that covered my plan is to switch to [facility]. Objective Constitutional Well-nourished and well-hydrated in no acute distress. Vitals Time Taken: 10:05 AM, Height: 75 in, Weight: 168 lbs, BMI: 21, Temperature: 97.8 F, Pulse: 64 bpm, Respiratory Rate: 18 breaths/min, Blood Pressure: Denman, Ysmael A ([id number]) [id number] Page 6 of 7 144/80 mmHg. Respiratory normal breathing without difficulty. Psychiatric this patient is able to make decisions and demonstrates good insight into disease process. Alert and Oriented x 3. pleasant and  cooperative. General Notes: Upon inspection patient's wound bed actually showed signs of good granulation epithelization at this point. Fortunately I do not see any evidence of active infection locally or systemically which is great news and overall I am extremely pleased with where we stand. No fevers, chills, nausea, vomiting, or diarrhea. Integumentary (Hair, Skin) Wound #2 status is Open. Original cause of wound was Laceration. The date acquired was: 11/24/2021. The wound has been in treatment 6 weeks. The wound is located on the Right,Midline,Anterior Lower Leg. The wound measures 4.5cm length x 1.4cm width x 0.3cm depth; 4.948cm^2 area and 1.484cm^3 volume. There is bone and Fat Layer (Subcutaneous Tissue) exposed. There is no tunneling or undermining noted. There is a medium amount of serosanguineous drainage noted. There is small (1-33%) red, hyper - granulation within the wound bed. There is a small (1-33%) amount of necrotic tissue within the wound bed including  Adherent Slough and Necrosis of Bone. 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 - patient seen at [facility] Fax [phone] Bathing/ Shower/ Hygiene: Wash wounds with antibacterial soap and water. May shower; gently cleanse wound with antibacterial soap, rinse and pat dry prior to dressing wounds Anesthetic (Use 'Patient Medications' Section for Anesthetic Order Entry): Lidocaine applied to wound bed Edema Control - Lymphedema / Segmental Compressive Device / Other: Elevate, Exercise Daily and Avoid Standing for Long Periods of Time. Elevate legs to the level of the heart and pump ankles as often as possible Elevate leg(s) parallel to the floor when sitting. WOUND  #2: - Lower Leg Wound Laterality: Right, Midline, Anterior Cleanser: Soap and Water Every Other Day/30 Days Discharge Instructions: Gently cleanse wound with antibacterial soap, rinse and pat dry prior to dressing wounds Prim Dressing: Prisma 4.34 (in) Every Other Day/30 Days ary Discharge Instructions: Moisten w/normal saline or sterile water; Cover wound as directed. Do not remove from wound bed. Secondary Dressing: Xeroform Petrolatum Gauze, Overwrap 1x8 (in/in) Every Other Day/30 Days Secured With: Kerlix Roll Sterile or Non-Sterile 6-ply 4.5x4 (yd/yd) Every Other Day/30 Days Discharge Instructions: Apply Kerlix as directed Secured With: Tubigrip Size D, 3x10 (in/yd) Every Other Day/30 Days Discharge Instructions: single layer 1. Based on what I am seeing I do believe that the patient would benefit from a continuation of therapy with the collagen as well as the Xeroform right now. Again once we get the bone completely covered I think switching to [facility] would be a good option. 2. I am also can recommend that we have the patient continue with the roll gauze to secure in place and is using Tubigrip size D right now so they can change this more frequently. Has been doing excellent. Again he is not too swollen anyway the Tubigrip is just more of a formality just to make sure there is no excessive swelling and they have done extremely well with this. We will see patient back for reevaluation in 1 week here in the clinic. If anything worsens or changes patient will contact our office for additional recommendations. Electronic Signature(s) Signed: 02/27/2022 10:30:53 AM By: Worthy Keeler PA-C Entered By: Worthy Keeler on 02/27/2022 10:30:53 Carriker, [name] ([id number]) [id number] Page 7 of 7 -------------------------------------------------------------------------------- SuperBill Details Patient Name: Date of Service: ROLLAN, ROGER 02/27/2022 Medical  Record Number: [id number] Patient Account Number: [id number] Date of Birth/Sex: Treating RN: [birth year] (88 y.o. 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 Facility Procedures : CPT4 Code: [id number] Description: 99213 - WOUND CARE VISIT-LEV 3 EST PT Modifier: Quantity: 1 Physician Procedures : CPT4 Code Description Modifier [id number] 99213 - WC PHYS LEVEL 3 - EST PT ICD-10 Diagnosis Description S80.811S Abrasion, right lower leg, sequela 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

## 2022-02-27 NOTE — Progress Notes (Signed)
[name], [name] ([id number]) [id number] Page 1 of 9 Visit Report for 02/27/2022 Arrival Information Details Patient Name: Date of Service: Theodore [name], Theodore [name] 02/27/2022 9:45 A M Medical Record Number: [id number] Patient Account Number: [id number] Date of Birth/Sex: Treating RN: Jul 18, 1929 (88 y.o. Theodore [name]) [name] Primary Care Banesa Tristan: [name] Other Clinician: Massie Kluver Referring Perfecto Purdy: Treating Wilhelmena Zea/Extender: [name] in Treatment: 6 Visit Information History Since Last Visit All ordered tests and consults were completed: No Patient Arrived: Gilford Rile Added or deleted any medications: No Arrival Time: 10:04 Any new allergies or adverse reactions: No Transfer Assistance: None Had a fall or experienced change in No Patient Identification Verified: Yes activities of daily living that may affect Secondary Verification Process Completed: Yes risk of falls: Patient Requires Transmission-Based Precautions: No Signs or symptoms of abuse/neglect since last visito No Patient Has Alerts: Yes Hospitalized since last visit: No Patient Alerts: ABI R .61 09/30/21 TBI .38 Implantable device outside of the clinic excluding No ABI L .49 09/30/21 TBI .47 cellular tissue based products placed in the center since last visit: Has Dressing in Place as Prescribed: Yes Has Compression in Place as Prescribed: Yes Pain Present Now: No Electronic Signature(s) Signed: 02/28/2022 4:44:06 PM By: Massie Kluver Entered By: Massie Kluver on 02/27/2022 10:05:26 -------------------------------------------------------------------------------- Clinic Level of Care Assessment Details Patient Name: Date of Service: Theodore [name], Theodore [name] 02/27/2022 9:45 A M Medical Record Number: [id number] Patient Account Number: [id number] Date of Birth/Sex: Treating RN: 22-Oct-1929 (88 y.o. [name] Primary Care Hennesy Sobalvarro: [name] Other Clinician: Massie Kluver Referring Allye Hoyos: Treating Esparanza Krider/Extender: [name] in Treatment: 6 Clinic Level of Care Assessment Items TOOL 4 Quantity Score [] - 0 Use when only an EandM is performed on FOLLOW-UP visit ASSESSMENTS - Nursing Assessment / Reassessment X- 1 10 Reassessment of Co-morbidities (includes updates in patient status) ANDRIAN, SABALA A ([id number]) [id number] Page 2 of 9 X- 1 5 Reassessment of Adherence to Treatment Plan ASSESSMENTS - Wound and Skin A ssessment / Reassessment X - Simple Wound Assessment / Reassessment - one wound 1 5 [] - 0 Complex Wound Assessment / Reassessment - multiple wounds [] - 0 Dermatologic / Skin Assessment (not related to wound area) ASSESSMENTS - Focused Assessment [] - 0 Circumferential Edema Measurements - multi extremities [] - 0 Nutritional Assessment / Counseling / Intervention [] - 0 Lower Extremity Assessment (monofilament, tuning fork, pulses) [] - 0 Peripheral Arterial Disease Assessment (using hand held doppler) ASSESSMENTS - Ostomy and/or Continence Assessment and Care [] - 0 Incontinence Assessment and Management [] - 0 Ostomy Care Assessment and Management (repouching, etc.) PROCESS - Coordination of Care X - Simple Patient / Family Education for ongoing care 1 15 [] - 0 Complex (extensive) Patient / Family Education for ongoing care [] - 0 Staff obtains Programmer, systems, Records, T Results / Process Orders est [] - 0 Staff telephones HHA, Nursing Homes / Clarify orders / etc [] - 0 Routine Transfer to another Facility (non-emergent condition) [] - 0 Routine Hospital Admission (non-emergent condition) [] - 0 New Admissions / Biomedical engineer / Ordering NPWT Apligraf, etc. , [] - 0 Emergency Hospital Admission (emergent condition) X- 1 10 Simple Discharge Coordination [] - 0 Complex (extensive) Discharge Coordination PROCESS - Special Needs [] - 0 Pediatric / Minor  Patient Management [] - 0 Isolation Patient Management [] - 0 Hearing / Language / Visual special needs [] - 0 Assessment of Community assistance (transportation, D/C planning,  etc.) [] - 0 Additional assistance / Altered mentation [] - 0 Support Surface(s) Assessment (bed, cushion, seat, etc.) INTERVENTIONS - Wound Cleansing / Measurement X - Simple Wound Cleansing - one wound 1 5 [] - 0 Complex Wound Cleansing - multiple wounds X- 1 5 Wound Imaging (photographs - any number of wounds) [] - 0 Wound Tracing (instead of photographs) X- 1 5 Simple Wound Measurement - one wound [] - 0 Complex Wound Measurement - multiple wounds INTERVENTIONS - Wound Dressings [] - 0 Small Wound Dressing one or multiple wounds X- 1 15 Medium Wound Dressing one or multiple wounds [] - 0 Large Wound Dressing one or multiple wounds [] - 0 Application of Medications - topical [] - 0 Application of Medications - injection INTERVENTIONS - Miscellaneous Hankerson, Trayson A ([id number]) [id number] Page 3 of 9 [] - 0 External ear exam [] - 0 Specimen Collection (cultures, biopsies, blood, body fluids, etc.) [] - 0 Specimen(s) / Culture(s) sent or taken to Lab for analysis [] - 0 Patient Transfer (multiple staff / Harrel Lemon Lift / Similar devices) [] - 0 Simple Staple / Suture removal (25 or less) [] - 0 Complex Staple / Suture removal (26 or more) [] - 0 Hypo / Hyperglycemic Management (close monitor of Blood Glucose) [] - 0 Ankle / Brachial Index (ABI) - do not check if billed separately X- 1 5 Vital Signs Has the patient been seen at the hospital within the last three years: Yes Total Score: 80 Level Of Care: New/Established - Level 3 Electronic Signature(s) Signed: 02/28/2022 4:44:06 PM By: Massie Kluver Entered By: Massie Kluver on 02/27/2022 10:22:52 -------------------------------------------------------------------------------- Encounter Discharge Information  Details Patient Name: Date of Service: [name]. 02/27/2022 9:45 A M Medical Record Number: [id number] Patient Account Number: [id number] Date of Birth/Sex: Treating RN: [birth year] (88 y.o. Theodore [name]) [name] Primary Care : [name] Other Clinician: Massie Kluver Referring : Treating /Extender: [name] in Treatment: 6 Encounter Discharge Information Items Discharge Condition: Stable Ambulatory Status: Walker Discharge Destination: Home Transportation: Private Auto Accompanied By: wife Schedule Follow-up Appointment: Yes Clinical Summary of Care: Electronic Signature(s) Signed: 02/28/2022 4:44:06 PM By: Massie Kluver Entered By: Massie Kluver on 02/27/2022 11:39:44 Sturkey, Klayton A ([id number]) [id number] Page 4 of 9 -------------------------------------------------------------------------------- Lower Extremity Assessment Details Patient Name: Date of Service: Theodore [name], Theodore [name] 02/27/2022 9:45 A M Medical Record Number: [id number] Patient Account Number: [id number] Date of Birth/Sex: Treating RN: 1929/05/19 (88 y.o. Theodore [name]) [name] Primary Care : [name] Other Clinician: Massie Kluver Referring : Treating /Extender: [name] Weeks in Treatment: 6 Vascular Assessment Pulses: Dorsalis Pedis Palpable: [Right:Yes] Electronic Signature(s) Signed: 02/27/2022 4:39:55 PM By: [name] RN Signed: 02/28/2022 4:44:06 PM By: Massie Kluver Entered By: Massie Kluver on 02/27/2022 10:13:57 -------------------------------------------------------------------------------- Multi Wound Chart Details Patient Name: Date of Service: Theodore [name], Theodore Y A. 02/27/2022 9:45 A M Medical Record Number: [id number] Patient Account Number: [id number] Date of Birth/Sex: Treating RN: 1929/07/10 (88 y.o. [name] Primary Care : [name] Other Clinician:  Massie Kluver Referring : Treating /Extender: [name] in Treatment: 6 Vital Signs Height(in): 75 Pulse(bpm): 46 Weight(lbs): 168 Blood Pressure(mmHg): 144/80 Body Mass Index(BMI): 21 Temperature(F): 97.8 Respiratory Rate(breaths/min): 18 [2:Photos:] [N/A:N/A] Right, Midline, Anterior Lower Leg N/A N/A Wound Location: Laceration N/A N/A Wounding Event: Skin Tear N/A N/A Primary Etiology: Trauma, Other N/A N/A Secondary Etiology: Chronic Obstructive Pulmonary N/A N/A Comorbid History: Disease (COPD), Peripheral Arterial Disease 11/24/2021  N/A N/A Date Acquired: 6 N/A N/A Weeks of Treatment: Open N/A N/A Wound Status: No N/A N/A Wound Recurrence: 4.5x1.4x0.3 N/A N/A Measurements L x W x D (cm) 4.948 N/A N/A A (cm) : rea 1.484 N/A N/A Volume (cm) : ELY, BALLEN A ([id number]) [id number] Page 5 of 9 49.50% N/A N/A % Reduction in Area: 78.40% N/A N/A % Reduction in Volume: Full Thickness With Exposed Support N/A N/A Classification: Structures Medium N/A N/A Exudate Amount: Serosanguineous N/A N/A Exudate Type: red, brown N/A N/A Exudate Color: Small (1-33%) N/A N/A Granulation Amount: Red, Hyper-granulation N/A N/A Granulation Quality: Small (1-33%) N/A N/A Necrotic Amount: Fat Layer (Subcutaneous Tissue): Yes N/A N/A Exposed Structures: Bone: Yes Fascia: No Tendon: No Muscle: No Joint: No Small (1-33%) N/A N/A Epithelialization: Treatment Notes Electronic Signature(s) Signed: 02/28/2022 4:44:06 PM By: Massie Kluver Entered By: Massie Kluver on 02/27/2022 10:14:06 -------------------------------------------------------------------------------- Booker Details Patient Name: Date of Service: [name]. 02/27/2022 9:45 A M Medical Record Number: [id number] Patient Account Number: [id number] Date of Birth/Sex: Treating RN: [birth year] (88 y.o. [name] Primary Care Jacqulynn Shappell: [name] Other Clinician: Massie Kluver Referring Lashelle Koy: Treating Davi Kroon/Extender: [name] in Treatment: 6 Active Inactive Necrotic Tissue Nursing Diagnoses: Impaired tissue integrity related to necrotic/devitalized tissue Knowledge deficit related to management of necrotic/devitalized tissue Goals: Necrotic/devitalized tissue will be minimized in the wound bed Date Initiated: 01/20/2022 Target Resolution Date: 02/19/2022 Goal Status: Active Patient/caregiver will verbalize understanding of reason and process for debridement of necrotic tissue Date Initiated: 01/20/2022 Target Resolution Date: 02/19/2022 Goal Status: Active Interventions: Assess patient pain level pre-, during and post procedure and prior to discharge Provide education on necrotic tissue and debridement process Treatment Activities: Apply topical anesthetic as ordered : 01/20/2022 Excisional debridement : 01/20/2022 Notes: Wound/Skin Impairment LEEANDRE, NORDLING A ([id number]) [phone].pdf Page 6 of 9 Nursing Diagnoses: Impaired tissue integrity Knowledge deficit related to smoking impact on wound healing Knowledge deficit related to ulceration/compromised skin integrity Goals: Patient will demonstrate a reduced rate of smoking or cessation of smoking Date Initiated: 01/13/2022 Date Inactivated: 02/17/2022 Target Resolution Date: 02/12/2022 Goal Status: Met Patient will have a decrease in wound volume by X% from date: (specify in notes) Date Initiated: 01/13/2022 Date Inactivated: 02/17/2022 Target Resolution Date: 02/12/2022 Goal Status: Met Patient/caregiver will verbalize understanding of skin care regimen Date Initiated: 01/13/2022 Target Resolution Date: 02/12/2022 Goal Status: Active Ulcer/skin breakdown will have a volume reduction of 30% by week 4 Date Initiated: 01/13/2022 Target Resolution Date: 02/12/2022 Goal  Status: Active Ulcer/skin breakdown will have a volume reduction of 50% by week 8 Date Initiated: 01/13/2022 Target Resolution Date: 03/15/2022 Goal Status: Active Ulcer/skin breakdown will have a volume reduction of 80% by week 12 Date Initiated: 01/13/2022 Target Resolution Date: 04/15/2022 Goal Status: Active Ulcer/skin breakdown will heal within 14 weeks Date Initiated: 01/13/2022 Target Resolution Date: 05/14/2022 Goal Status: Active Interventions: Assess patient/caregiver ability to obtain necessary supplies Assess patient/caregiver ability to perform ulcer/skin care regimen upon admission and as needed Assess ulceration(s) every visit Provide education on ulcer and skin care Screen for HBO Treatment Activities: Skin care regimen initiated : 01/13/2022 Topical wound management initiated : 01/13/2022 Notes: Electronic Signature(s) Signed: 02/27/2022 4:39:55 PM By: [name] RN Signed: 02/28/2022 4:44:06 PM By: Massie Kluver Entered By: Massie Kluver on 02/27/2022 10:35:33 -------------------------------------------------------------------------------- Pain Assessment Details Patient Name: Date of Service: Badami, Theodore Y A. 02/27/2022 9:45 A M Medical Record Number: [id number] Patient Account Number: [id number] Date of Birth/Sex:  Treating RN: Nov 07, 1929 (88 y.o. [name] Primary Care : [name] Other Clinician: Massie Kluver Referring : Treating /Extender: [name] Weeks in Treatment: 6 Active Problems Location of Pain Severity and Description of Pain Patient Has Paino No Site Locations SHIVEN, JUNIOUS A ([id number]) [id number] Page 7 of 9 Pain Management and Medication Current Pain Management: Electronic Signature(s) Signed: 02/27/2022 4:39:55 PM By: [name] RN Signed: 02/28/2022 4:44:06 PM By: Massie Kluver Entered By: Massie Kluver on 02/27/2022  10:07:35 -------------------------------------------------------------------------------- Patient/Caregiver Education Details Patient Name: Date of Service: [name] 12/18/2023andnbsp9:45 [place] Record Number: [id number] Patient Account Number: [id number] Date of Birth/Gender: Treating RN: Oct 29, 1929 (88 y.o. [name] Primary Care Physician: [name] Other Clinician: Massie Kluver Referring Physician: Treating Physician/Extender: [name] in Treatment: 6 Education Assessment Education Provided To: Patient Education Topics Provided Wound/Skin Impairment: Handouts: Other: continue wound care as directed Methods: Explain/Verbal Responses: State content correctly Electronic Signature(s) Signed: 02/28/2022 4:44:06 PM By: Massie Kluver Entered By: Massie Kluver on 02/27/2022 10:35:29 Coate, Jahkari A ([id number]) [id number] Page 8 of 9 -------------------------------------------------------------------------------- Wound Assessment Details Patient Name: Date of Service: DARYEL, Theodore [name] 02/27/2022 9:45 A M Medical Record Number: [id number] Patient Account Number: [id number] Date of Birth/Sex: Treating RN: 1929-10-06 (88 y.o. Theodore [name]) [name] Primary Care : [name] Other Clinician: Massie Kluver Referring : Treating /Extender: [name] Weeks in Treatment: 6 Wound Status Wound Number: 2 Primary Skin Tear Etiology: Wound Location: Right, Midline, Anterior Lower Leg Secondary Trauma, Other Wounding Event: Laceration Etiology: Date Acquired: 11/24/2021 Wound Status: Open Weeks Of Treatment: 6 Comorbid Chronic Obstructive Pulmonary Disease (COPD), Peripheral Clustered Wound: No History: Arterial Disease Photos Wound Measurements Length: (cm) 4.5 Width: (cm) 1.4 Depth: (cm) 0.3 Area: (cm) 4.948 Volume: (cm) 1.484 % Reduction in Area: 49.5% % Reduction  in Volume: 78.4% Epithelialization: Small (1-33%) Tunneling: No Undermining: No Wound Description Classification: Full Thickness With Exposed Suppo Exudate Amount: Medium Exudate Type: Serosanguineous Exudate Color: red, brown rt Structures Foul Odor After Cleansing: No Slough/Fibrino Yes Wound Bed Granulation Amount: Small (1-33%) Exposed Structure Granulation Quality: Red, Hyper-granulation Fascia Exposed: No Necrotic Amount: Small (1-33%) Fat Layer (Subcutaneous Tissue) Exposed: Yes Necrotic Quality: Adherent Slough, Bone Tendon Exposed: No Muscle Exposed: No Joint Exposed: No Bone Exposed: Yes Treatment Notes Wound #2 (Lower Leg) Wound Laterality: Right, Midline, Anterior Cleanser Soap and Water Discharge Instruction: Gently cleanse wound with antibacterial soap, rinse and pat dry prior to dressing wounds AADON, GORELIK A ([id number]) [id number] Page 9 of 9 Peri-Wound Care Topical Primary Dressing Prisma 4.34 (in) Discharge Instruction: Moisten w/normal saline or sterile water; Cover wound as directed. Do not remove from wound bed. Secondary Dressing Xeroform Petrolatum Gauze, Overwrap 1x8 (in/in) Secured With [employer] or Non-Sterile 6-ply 4.5x4 (yd/yd) Discharge Instruction: Apply Kerlix as directed Tubigrip Size D, 3x10 (in/yd) Discharge Instruction: single layer Compression Wrap Compression Stockings Add-Ons Electronic Signature(s) Signed: 02/27/2022 4:39:55 PM By: [name] RN Signed: 02/28/2022 4:44:06 PM By: Massie Kluver Entered By: Massie Kluver on 02/27/2022 10:13:21 -------------------------------------------------------------------------------- Vitals Details Patient Name: Date of Service: Theodore [name], Theodore Y A. 02/27/2022 9:45 A M Medical Record Number: [id number] Patient Account Number: [id number] Date of Birth/Sex: Treating RN: 1929/11/12 (88 y.o. [name] Primary Care : [name] Other  Clinician: Massie Kluver Referring : Treating /Extender: [name] Weeks in Treatment: 6 Vital Signs Time Taken: 10:05 Temperature (F): 97.8 Height (in): 75 Pulse (bpm): 64 Weight (lbs):  168 Respiratory Rate (breaths/min): 18 Body Mass Index (BMI): 21 Blood Pressure (mmHg): 144/80 Reference Range: 80 - 120 mg / dl Electronic Signature(s) Signed: 02/28/2022 4:44:06 PM By: Massie Kluver Entered By: Massie Kluver on 02/27/2022 10:07:30

## 2022-02-28 ENCOUNTER — Other Ambulatory Visit (INDEPENDENT_AMBULATORY_CARE_PROVIDER_SITE_OTHER): Payer: Self-pay | Admitting: Nurse Practitioner

## 2022-02-28 ENCOUNTER — Ambulatory Visit: Payer: PPO | Admitting: Speech Pathology

## 2022-02-28 DIAGNOSIS — G309 Alzheimer's disease, unspecified: Secondary | ICD-10-CM

## 2022-02-28 DIAGNOSIS — G3184 Mild cognitive impairment, so stated: Secondary | ICD-10-CM

## 2022-02-28 DIAGNOSIS — R479 Unspecified speech disturbances: Secondary | ICD-10-CM | POA: Diagnosis not present

## 2022-02-28 DIAGNOSIS — I739 Peripheral vascular disease, unspecified: Secondary | ICD-10-CM

## 2022-02-28 DIAGNOSIS — R41841 Cognitive communication deficit: Secondary | ICD-10-CM

## 2022-03-01 ENCOUNTER — Ambulatory Visit (INDEPENDENT_AMBULATORY_CARE_PROVIDER_SITE_OTHER): Payer: PPO

## 2022-03-01 ENCOUNTER — Ambulatory Visit (INDEPENDENT_AMBULATORY_CARE_PROVIDER_SITE_OTHER): Payer: PPO | Admitting: Nurse Practitioner

## 2022-03-01 ENCOUNTER — Other Ambulatory Visit (INDEPENDENT_AMBULATORY_CARE_PROVIDER_SITE_OTHER): Payer: Self-pay | Admitting: Nurse Practitioner

## 2022-03-01 ENCOUNTER — Encounter (INDEPENDENT_AMBULATORY_CARE_PROVIDER_SITE_OTHER): Payer: Self-pay | Admitting: Nurse Practitioner

## 2022-03-01 ENCOUNTER — Other Ambulatory Visit (INDEPENDENT_AMBULATORY_CARE_PROVIDER_SITE_OTHER): Payer: PPO

## 2022-03-01 VITALS — BP 138/68 | HR 54 | Resp 16 | Wt 171.4 lb

## 2022-03-01 DIAGNOSIS — Z9889 Other specified postprocedural states: Secondary | ICD-10-CM | POA: Diagnosis not present

## 2022-03-01 DIAGNOSIS — E785 Hyperlipidemia, unspecified: Secondary | ICD-10-CM | POA: Diagnosis not present

## 2022-03-01 DIAGNOSIS — I739 Peripheral vascular disease, unspecified: Secondary | ICD-10-CM

## 2022-03-02 ENCOUNTER — Encounter: Payer: PPO | Admitting: Speech Pathology

## 2022-03-02 ENCOUNTER — Ambulatory Visit: Payer: PPO | Admitting: Speech Pathology

## 2022-03-02 DIAGNOSIS — G309 Alzheimer's disease, unspecified: Secondary | ICD-10-CM

## 2022-03-02 DIAGNOSIS — G3184 Mild cognitive impairment, so stated: Secondary | ICD-10-CM

## 2022-03-02 DIAGNOSIS — R41841 Cognitive communication deficit: Secondary | ICD-10-CM

## 2022-03-02 DIAGNOSIS — R479 Unspecified speech disturbances: Secondary | ICD-10-CM | POA: Diagnosis not present

## 2022-03-03 ENCOUNTER — Ambulatory Visit: Payer: PPO | Admitting: Internal Medicine

## 2022-03-03 NOTE — Therapy (Signed)
OUTPATIENT SPEECH LANGUAGE PATHOLOGY TREATMENT NOTE   Patient Name: Theodore [name] MRN: [id number] DOB:[birth year], 88 y.o., male Today's Date: 03/02/2022  PCP: [name], MD REFERRING PROVIDER: [name], MD  END OF SESSION:   End of Session - 03/03/22 2147     Visit Number 3    Number of Visits 25    Date for SLP Re-Evaluation 05/17/22    Authorization Type [employer] P[employer]    Progress Note Due on Visit 10    SLP Start Time 0900    SLP Stop Time  1000    SLP Time Calculation (min) 60 min    Activity Tolerance Other (comment)   pt appears to deflect/dismiss information provided                Past Medical History:  Diagnosis Date   Actinic keratosis    Arthritis    knees   Asymptomatic Sinus bradycardia    a. 05/2016 Zio: Avg HR 61 (41-167).   Benign essential tremor    Cancer ([place]) 2002   melanoma right ear   Colon polyp    COPD (chronic obstructive pulmonary disease) ([place])    pt said he believes it is a misdiagnosis    Coronary artery calcification seen on CT scan    a. 06/2016 CTA Chest: cor Ca2+; b. 05/2017 MV: EF >65%. No ischemia/infarct.   Depression    History of echocardiogram    a. 04/2016 Echo: EF 60-65%, mild conc LVH. Nl PASP.   History of kidney stones    Hx of dysplastic nevus 05/07/2017   L anterior shoulder, severe   Hx of dysplastic nevus 09/06/2020   R lower sternum, moderate atypia   Hypothyroidism    Hypothyroidism    Laceration of right lower extremity    Melanoma ([place]) 1990's per pt   R ear   Melanoma in situ ([place]) 01/12/2014   left jaw   MSSA infection, non-invasive    Palpitations    a. 05/2016 Zio: Avg HR 61 (41-167). 9 SVT runs (fastest 167 - 5 beats; longest 8 beats - 101 bpm). Rare PACs/PVCs.   PVC's (premature ventricular contractions)    Rhinitis    Sepsis due to cellulitis ([place]) 11/28/2021   Severe sepsis ([place]) 11/28/2021   Wears dentures    partial upper and lower   Wears hearing aid in both ears     Past Surgical History:  Procedure Laterality Date   ADENOIDECTOMY     CATARACT EXTRACTION W/PHACO Left 10/22/2017   Procedure: CATARACT EXTRACTION PHACO AND INTRAOCULAR LENS PLACEMENT ([place])  LEFT;  Surgeon: Eulogio Bear, MD;  Location: [place];  Service: Ophthalmology;  Laterality: Left;   CATARACT EXTRACTION W/PHACO Right 11/20/2017   Procedure: CATARACT EXTRACTION PHACO AND INTRAOCULAR LENS PLACEMENT (IOC) RIGHT;  Surgeon: Eulogio Bear, MD;  Location: [place];  Service: Ophthalmology;  Laterality: Right;   ESOPHAGOGASTRODUODENOSCOPY (EGD) WITH PROPOFOL N/A 10/31/2016   Procedure: ESOPHAGOGASTRODUODENOSCOPY (EGD) WITH PROPOFOL;  Surgeon: Lollie Sails, MD;  Location[facility] ENDOSCOPY;  Service: Endoscopy;  Laterality: N/A;   I & D EXTREMITY Right 11/29/2021   Procedure: IRRIGATION AND DEBRIDEMENT EXTREMITY;  Surgeon: Olean Ree, MD;  Location: [facility] ORS;  Service: General;  Laterality: Right;   JOINT REPLACEMENT Right 2003   knee   knee meniscus repair Right    MELANOMA EXCISION Right    ear. Followed by Dr. Nehemiah Massed   PATELLECTOMY Bilateral    TONSILLECTOMY  Patient Active Problem List   Diagnosis Date Noted   Sialorrhea 02/02/2022   Protein-calorie malnutrition, moderate ([place]) 11/29/2021   Dementia in Alzheimer's disease with depression ([place]) 11/28/2021   Dyslipidemia 11/28/2021   GERD without esophagitis 11/28/2021   Wound infection, posttraumatic 09/22/2021   Proximal muscle weakness 08/27/2021   Speech disturbance 08/26/2021   Adverse drug reaction, initial encounter 05/04/2021   Lumbar radiculopathy 04/25/2021   Thoracic aortic atherosclerosis ([place]) 01/17/2021   Dizziness 12/31/2020   Anxiety in acute stress reaction 12/07/2020   Mild cognitive impairment with memory loss 11/30/2020   Elevated PSA, between 10 and less than 20 ng/ml 11/30/2020   Chronic kidney disease, stage 3b ([place]) 11/10/2020   Benign hypertensive kidney disease  with chronic kidney disease 11/10/2020   COVID-19 virus infection 04/06/2020   Acquired trigger finger of left middle finger 08/07/2019   Carpal tunnel syndrome of right wrist 07/10/2019   Ulnar neuropathy 06/05/2019   Disorder of bursae of shoulder region 01/29/2019   Localized, primary osteoarthritis of shoulder region 01/29/2019   Localized, secondary osteoarthritis of the shoulder region 01/29/2019   Osteoarthritis of knee 01/29/2019   Shoulder joint pain 01/29/2019   Chronic obstructive pulmonary disease ([place]) 09/24/2018   Grief 09/02/2018   Cervical radiculopathy 08/14/2018   PVC (premature ventricular contraction) 03/21/2018   PAD (peripheral artery disease) ([place])    Anterior knee pain, left 12/26/2015   Hypothyroidism 07/04/2014   Nonallergic vasomotor rhinitis 07/01/2014   Hypertrophy of prostate with urinary obstruction and other lower urinary tract symptoms (LUTS) 08/08/2013   Low serum testosterone level 04/28/2013   Actinic keratosis 05/10/2010   History of malignant melanoma of skin 05/10/2010    ONSET DATE: date of referrals 02/01/2022 and 02/22/2022    REFERRING DIAG: R47.1 (ICD-10-CM) - Dysarthria R41.89 (ICD-10-CM) - Cognitive decline     PERTINENT HISTORY: Pt with recent course of Outpatient ST services same deficits (08/26/2021 and 09/07/2021).  [name]      07/21/2019 - pt underwent MRI guided focused ultrasound thalamotomy LEFT sided for treatment of essential tremor of RIGHT arm    07/30/2021 - pt underwent MRI guided focused ultrasound thalamotomy RIGHT sided for treatment of essential tremor of LEFT arm        DIAGNOSTIC FINDINGS:    MRi 07/05/2021 consistent with recent MR guided focus ultrasound ablation in the right subthalamic region A focus of diffusion restriction right subthalamic region measures 9 x 6 mm     MBSS 2017    MBSS 11/04/2021 Pt presents with overall adequate oropharyngeal abilities when consuming puree, soft solids, thin liquids  via spoon and cup as well as whole barium tablet with thin liquids. Pt's swallow function doesn't appear any different than documented in his previous MBSS (2017) which stated, "Thin liquid- small amount of laryngeal penetration with rapid successive swallowing maneuvers. With single swallowing maneuvers no laryngeal penetration is observed. As well in this previous study, pt continues to demonstrate penetration with trace aspiration when consuming successive swallows. Pt demonstrated understanding need to take single sips as he stated that he doesn't consume consecutive sips at baseline. At this time, pt presents with adequate oropharyngeal abilities and reduced risk of aspiration when consuming regular diet with thin liquids via single cup sips and medicine whole with thin liquids. Skilled ST intervention is not indicated.   THERAPY DIAG:  Cognitive communication deficit  Mild cognitive impairment with memory loss  Dementia in Alzheimer's disease with depression ([place])  Rationale for Evaluation and Treatment Rehabilitation  SUBJECTIVE: pt pleasant, several instances of hearing inaccuracy  Pt accompanied by: significant other  PAIN:  Are you having pain? No  PATIENT GOALS: to get better  OBJECTIVE:   TODAY'S TREATMENT:   Pt presented wearing only his right hearing aid again this session but states that his ear is better but he was out of the habit of putting the left aid in as it has been weeks since he was able to wear it. Pt's wife appeared fatigued and SLP facilitated identification of activities that she is finding difficult in light of pt's recent diagnosis. Extended information both written and verbal on hearing loss, need to seek quicker improvement for sore to promote continued hearing and understanding of words. Pt's responses appeared deflective/dismissive of information as he provided alternative rationale.   Extensive education provided to pt's wife on ways to facilitate healthy  care giving to preserve her ability to provide for pt.    PATIENT EDUCATION: Education details: see above Person educated: Patient and Spouse Education method: Explanation, Media planner, Verbal cues, and Handouts Education comprehension: needs further education  HOME EXERCISE PROGRAM:  N/A  GOALS: Goals reviewed with patient? Yes  SHORT TERM GOALS: Target date: 10 sessions  Patient will demonstrate understanding of functional cognitive activities for home maintenance program with moderate assistance Baseline: Maximal Goal status: INITIAL   2.  Given moderate assistance, patient will demonstrate knowledge of appropriate activities to support cognitive and  language function outside of ST with assistance from family.  Baseline: Maximal Goal status: INITIAL   3.  Patient will report engagement in cognitive activities outside of ST 4/7 days.  Baseline:  Goal status: INITIAL   4.  Pt will verbalize and demonstrate how to implement 1 memory and attention strategies to aid daily functioning with moderate A over 2 sessions  Baseline: currently not using any strategies Goal status: INITIAL     LONG TERM GOALS: Target date: 05/17/2022 Patient will demonstrate understanding of functional cognitive activities for home maintenance program with minimal assistance Baseline: maximal Goal status: INITIAL  ASSESSMENT:  CLINICAL IMPRESSION: Pt demonstrated increased poor activity tolerance with specific difficulty understanding, hearing and possible baseline personality qualities.   OBJECTIVE IMPAIRMENTS include attention, memory, awareness, executive functioning, dysarthria, dysphagia, and motor speech . These impairments are limiting patient from managing medications, managing appointments, managing finances, household responsibilities, ADLs/IADLs, effectively communicating at home and in community, and safety when swallowing. Factors affecting potential to achieve goals and functional  outcome are ability to learn/carryover information, co-morbidities, cooperation/participation level, medical prognosis, previous level of function, and severity of impairments. Patient will benefit from skilled SLP services to address above impairments and improve overall function.  REHAB POTENTIAL: Fair bilateral thalamotomy  PLAN: SLP FREQUENCY: 1-2x/week  SLP DURATION: 8 weeks  PLANNED INTERVENTIONS: Aspiration precaution training, Environmental controls, Cueing hierachy, Cognitive reorganization, Internal/external aids, Functional tasks, SLP instruction and feedback, Compensatory strategies, and Patient/family education  Lissie Hinesley B. Rutherford Nail, M.S., CCC-SLP, Mining engineer Certified Brain Injury [place]  [place] Office [phone] Ascom [phone] Fax [phone]

## 2022-03-03 NOTE — Therapy (Signed)
OUTPATIENT SPEECH LANGUAGE PATHOLOGY TREATMENT NOTE   Patient Name: Theodore [name] MRN: [id number] DOB:10/02/1929, 88 y.o., male Today's Date: 02/28/2022  PCP: [name], MD REFERRING PROVIDER: [name], MD  END OF SESSION:   End of Session - 02/28/22 2132     Visit Number 2    Number of Visits 25    Date for SLP Re-Evaluation 05/17/22    Authorization Type [employer] P[employer]    Progress Note Due on Visit 10    SLP Start Time 1200    SLP Stop Time  1300    SLP Time Calculation (min) 60 min    Activity Tolerance Patient tolerated treatment well             Past Medical History:  Diagnosis Date   Actinic keratosis    Arthritis    knees   Asymptomatic Sinus bradycardia    a. 05/2016 Zio: Avg HR 61 (41-167).   Benign essential tremor    Cancer ([place]) 2002   melanoma right ear   Colon polyp    COPD (chronic obstructive pulmonary disease) ([place])    pt said he believes it is a misdiagnosis    Coronary artery calcification seen on CT scan    a. 06/2016 CTA Chest: cor Ca2+; b. 05/2017 MV: EF >65%. No ischemia/infarct.   Depression    History of echocardiogram    a. 04/2016 Echo: EF 60-65%, mild conc LVH. Nl PASP.   History of kidney stones    Hx of dysplastic nevus 05/07/2017   L anterior shoulder, severe   Hx of dysplastic nevus 09/06/2020   R lower sternum, moderate atypia   Hypothyroidism    Hypothyroidism    Laceration of right lower extremity    Melanoma ([place]) 1990's per pt   R ear   Melanoma in situ ([place]) 01/12/2014   left jaw   MSSA infection, non-invasive    Palpitations    a. 05/2016 Zio: Avg HR 61 (41-167). 9 SVT runs (fastest 167 - 5 beats; longest 8 beats - 101 bpm). Rare PACs/PVCs.   PVC's (premature ventricular contractions)    Rhinitis    Sepsis due to cellulitis ([place]) 11/28/2021   Severe sepsis ([place]) 11/28/2021   Wears dentures    partial upper and lower   Wears hearing aid in both ears    Past Surgical History:  Procedure Laterality  Date   ADENOIDECTOMY     CATARACT EXTRACTION W/PHACO Left 10/22/2017   Procedure: CATARACT EXTRACTION PHACO AND INTRAOCULAR LENS PLACEMENT ([place])  LEFT;  Surgeon: Eulogio Bear, MD;  Location: [place];  Service: Ophthalmology;  Laterality: Left;   CATARACT EXTRACTION W/PHACO Right 11/20/2017   Procedure: CATARACT EXTRACTION PHACO AND INTRAOCULAR LENS PLACEMENT (IOC) RIGHT;  Surgeon: Eulogio Bear, MD;  Location: [place];  Service: Ophthalmology;  Laterality: Right;   ESOPHAGOGASTRODUODENOSCOPY (EGD) WITH PROPOFOL N/A 10/31/2016   Procedure: ESOPHAGOGASTRODUODENOSCOPY (EGD) WITH PROPOFOL;  Surgeon: Lollie Sails, MD;  Location: [facility] ENDOSCOPY;  Service: Endoscopy;  Laterality: N/A;   I & D EXTREMITY Right 11/29/2021   Procedure: IRRIGATION AND DEBRIDEMENT EXTREMITY;  Surgeon: Olean Ree, MD;  Location: [facility] ORS;  Service: General;  Laterality: Right;   JOINT REPLACEMENT Right 2003   knee   knee meniscus repair Right    MELANOMA EXCISION Right    ear. Followed by Dr. 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 ([place]) 11/28/2021   Dyslipidemia 11/28/2021   GERD without esophagitis 11/28/2021   Wound infection, posttraumatic 09/22/2021   Proximal muscle weakness 08/27/2021   Speech disturbance 08/26/2021   Adverse drug reaction, initial encounter 05/04/2021   Lumbar radiculopathy 04/25/2021   Thoracic aortic atherosclerosis ([place]) 01/17/2021   Dizziness 12/31/2020   Anxiety in acute stress reaction 12/07/2020   Mild cognitive impairment with memory loss 11/30/2020   Elevated PSA, between 10 and less than 20 ng/ml 11/30/2020   Chronic kidney disease, stage 3b ([place]) 11/10/2020   Benign hypertensive kidney disease with chronic kidney disease 11/10/2020    COVID-19 virus infection 04/06/2020   Acquired trigger finger of left middle finger 08/07/2019   Carpal tunnel syndrome of right wrist 07/10/2019   Ulnar neuropathy 06/05/2019   Disorder of bursae of shoulder region 01/29/2019   Localized, primary osteoarthritis of shoulder region 01/29/2019   Localized, secondary osteoarthritis of the shoulder region 01/29/2019   Osteoarthritis of knee 01/29/2019   Shoulder joint pain 01/29/2019   Chronic obstructive pulmonary disease ([place]) 09/24/2018   Grief 09/02/2018   Cervical radiculopathy 08/14/2018   PVC (premature ventricular contraction) 03/21/2018   PAD (peripheral artery disease) ([place])    Anterior knee pain, left 12/26/2015   Hypothyroidism 07/04/2014   Nonallergic vasomotor rhinitis 07/01/2014   Hypertrophy of prostate with urinary obstruction and other lower urinary tract symptoms (LUTS) 08/08/2013   Low serum testosterone level 04/28/2013   Actinic keratosis 05/10/2010   History of malignant melanoma of skin 05/10/2010    ONSET DATE: date of referrals 02/01/2022 and 02/22/2022    REFERRING DIAG: R47.1 (ICD-10-CM) - Dysarthria R41.89 (ICD-10-CM) - Cognitive decline     PERTINENT HISTORY: Pt with recent course of Outpatient ST services same deficits (08/26/2021 and 09/07/2021).  [name]      07/21/2019 - pt underwent MRI guided focused ultrasound thalamotomy LEFT sided for treatment of essential tremor of RIGHT arm    07/30/2021 - pt underwent MRI guided focused ultrasound thalamotomy RIGHT sided for treatment of essential tremor of LEFT arm        DIAGNOSTIC FINDINGS:    MRi 07/05/2021 consistent with recent MR guided focus ultrasound ablation in the right subthalamic region A focus of diffusion restriction right subthalamic region measures 9 x 6 mm     MBSS 2017    MBSS 11/04/2021 Pt presents with overall adequate oropharyngeal abilities when consuming puree, soft solids, thin liquids via spoon and cup as well as whole barium  tablet with thin liquids. Pt's swallow function doesn't appear any different than documented in his previous MBSS (2017) which stated, "Thin liquid- small amount of laryngeal penetration with rapid successive swallowing maneuvers. With single swallowing maneuvers no laryngeal penetration is observed. As well in this previous study, pt continues to demonstrate penetration with trace aspiration when consuming successive swallows. Pt demonstrated understanding need to take single sips as he stated that he doesn't consume consecutive sips at baseline. At this time, pt presents with adequate oropharyngeal abilities and reduced risk of aspiration when consuming regular diet with thin liquids via single cup sips and medicine whole with thin liquids. Skilled ST intervention is not indicated.   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 No  PATIENT GOALS: to get better  OBJECTIVE:   TODAY'S TREATMENT: Pt presents wearing his right hearing aid only and states that his left ear has a sore that he has been treating for several weeks with tea tree oil. There were at least 4 times throughout the session that pt misunderstood what was said. This increased pt's confusion. Skilled educaiton was provided ot pt and his wife on possible connection between bilateral thalomotomy and speech/swallowing deficits. Written information provided as well as verbal. Discussion followed about cognitive deficits and including such goals in ST POC. All questions were answered to pt and his wife's satisfaction.       PATIENT EDUCATION: Education details: see above Person educated: Patient and Spouse Education method: Explanation,  Media planner, Verbal cues, and Handouts Education comprehension: needs further education  HOME EXERCISE PROGRAM:  N/A  GOALS: Goals reviewed with patient? Yes  SHORT TERM GOALS: Target date: 10 sessions  Patient will demonstrate understanding of functional cognitive activities for home maintenance program with moderate assistance Baseline: Maximal Goal status: INITIAL   2.  Given moderate assistance, patient will demonstrate knowledge of appropriate activities to support cognitive and  language function outside of ST with assistance from family.  Baseline: Maximal Goal status: INITIAL   3.  Patient will report engagement in cognitive activities outside of ST 4/7 days.  Baseline:  Goal status: INITIAL   4.  Pt will verbalize and demonstrate how to implement 1 memory and attention strategies to aid daily functioning with moderate A over 2 sessions  Baseline: currently not using any strategies Goal status: INITIAL     LONG TERM GOALS: Target date: 05/17/2022 Patient will demonstrate understanding of functional cognitive activities for home maintenance program with minimal assistance Baseline: maximal Goal status: INITIAL  ASSESSMENT:  CLINICAL IMPRESSION: Patient presents with eagerness to learn.    OBJECTIVE IMPAIRMENTS include attention, memory, awareness, executive functioning, dysarthria, dysphagia, and motor speech . These impairments are limiting patient from managing medications, managing appointments, managing finances, household responsibilities, ADLs/IADLs, effectively communicating at home and in community, and safety when swallowing. Factors affecting potential to achieve goals and functional outcome are ability to learn/carryover information, co-morbidities, cooperation/participation level, medical prognosis, previous level of function, and severity of impairments. Patient will benefit from skilled SLP services to address above impairments and improve overall  function.  REHAB POTENTIAL: Fair bilateral thalamotomy  PLAN: SLP FREQUENCY: 1-2x/week  SLP DURATION: 8 weeks  PLANNED INTERVENTIONS: Aspiration precaution training, Environmental controls, Cueing hierachy, Cognitive reorganization, Internal/external aids, Functional tasks, SLP instruction and feedback, Compensatory strategies, and Patient/family education  Nicki Gracy B. Rutherford Nail, M.S., CCC-SLP, Mining engineer Certified Brain Injury [place]  [place] Office [phone] Ascom [phone] Fax [phone]

## 2022-03-07 ENCOUNTER — Encounter: Payer: PPO | Admitting: Speech Pathology

## 2022-03-08 ENCOUNTER — Ambulatory Visit
Admission: EM | Admit: 2022-03-08 | Discharge: 2022-03-08 | Disposition: A | Discharge status: Home / Self Care | Payer: PPO | Attending: Urgent Care | Admitting: Urgent Care

## 2022-03-08 ENCOUNTER — Telehealth: Payer: Self-pay | Admitting: Internal Medicine

## 2022-03-08 DIAGNOSIS — J441 Chronic obstructive pulmonary disease with (acute) exacerbation: Secondary | ICD-10-CM

## 2022-03-08 DIAGNOSIS — J069 Acute upper respiratory infection, unspecified: Secondary | ICD-10-CM | POA: Diagnosis not present

## 2022-03-08 MED ORDER — BUDESONIDE 0.5 MG/2ML IN SUSP: 0.5000 mg | Freq: Two times a day (BID) | RESPIRATORY_TRACT | 0 refills | Status: DC

## 2022-03-08 MED ORDER — ALBUTEROL SULFATE (2.5 MG/3ML) 0.083% IN NEBU: 2.5000 mg | INHALATION_SOLUTION | Freq: Four times a day (QID) | RESPIRATORY_TRACT | 0 refills | Status: DC | PRN

## 2022-03-08 NOTE — ED Provider Notes (Signed)
[facility]    CSN: [id number] Arrival date & time: 03/08/22  1113      History   Chief Complaint Chief Complaint  Patient presents with   Cough   Shortness of Breath    HPI Theodore [name] is a 88 y.o. male.    Cough Associated symptoms: shortness of breath   Shortness of Breath Associated symptoms: cough     Presents to urgent care with concern for cough, congestion, shortness of breath x 3 days.  He states symptoms are worse at night and he is unable to get any rest.    Presents with elevated respiration rate of 21.  Dors is copious mucus secretions from his nose and productive cough.  Describes the color and consistency of the secretions as sticky and "mostly clear".  His wife, present during today's visit, states that he has a history of chronic overproduction of mucus.  He states he has history of treatment by pulmonologist for COPD.  Chart shows history of treatment at [facility] pulmonology for COPD Gold stage a.  Past Medical History:  Diagnosis Date   Actinic keratosis    Arthritis    knees   Asymptomatic Sinus bradycardia    a. 05/2016 Zio: Avg HR 61 (41-167).   Benign essential tremor    Cancer ([place]) 2002   melanoma right ear   Colon polyp    COPD (chronic obstructive pulmonary disease) ([place])    pt said he believes it is a misdiagnosis    Coronary artery calcification seen on CT scan    a. 06/2016 CTA Chest: cor Ca2+; b. 05/2017 MV: EF >65%. No ischemia/infarct.   Depression    History of echocardiogram    a. 04/2016 Echo: EF 60-65%, mild conc LVH. Nl PASP.   History of kidney stones    Hx of dysplastic nevus 05/07/2017   L anterior shoulder, severe   Hx of dysplastic nevus 09/06/2020   R lower sternum, moderate atypia   Hypothyroidism    Hypothyroidism    Laceration of right lower extremity    Melanoma ([place]) 1990's per pt   R ear   Melanoma in situ ([place]) 01/12/2014   left jaw   MSSA infection, non-invasive    Palpitations    a. 05/2016  Zio: Avg HR 61 (41-167). 9 SVT runs (fastest 167 - 5 beats; longest 8 beats - 101 bpm). Rare PACs/PVCs.   PVC's (premature ventricular contractions)    Rhinitis    Sepsis due to cellulitis ([place]) 11/28/2021   Severe sepsis ([place]) 11/28/2021   Wears dentures    partial upper and lower   Wears hearing aid in both ears     Patient Active Problem List   Diagnosis Date Noted   Sialorrhea 02/02/2022   Protein-calorie malnutrition, moderate ([place]) 11/29/2021   Dementia in Alzheimer's disease with depression ([place]) 11/28/2021   Dyslipidemia 11/28/2021   GERD without esophagitis 11/28/2021   Wound infection, posttraumatic 09/22/2021   Proximal muscle weakness 08/27/2021   Speech disturbance 08/26/2021   Adverse drug reaction, initial encounter 05/04/2021   Lumbar radiculopathy 04/25/2021   Thoracic aortic atherosclerosis ([place]) 01/17/2021   Dizziness 12/31/2020   Anxiety in acute stress reaction 12/07/2020   Mild cognitive impairment with memory loss 11/30/2020   Elevated PSA, between 10 and less than 20 ng/ml 11/30/2020   Chronic kidney disease, stage 3b ([place]) 11/10/2020   Benign hypertensive kidney disease with chronic kidney disease 11/10/2020   COVID-19 virus infection 04/06/2020   Acquired  trigger finger of left middle finger 08/07/2019   Carpal tunnel syndrome of right wrist 07/10/2019   Ulnar neuropathy 06/05/2019   Disorder of bursae of shoulder region 01/29/2019   Localized, primary osteoarthritis of shoulder region 01/29/2019   Localized, secondary osteoarthritis of the shoulder region 01/29/2019   Osteoarthritis of knee 01/29/2019   Shoulder joint pain 01/29/2019   Chronic obstructive pulmonary disease ([place]) 09/24/2018   Grief 09/02/2018   Cervical radiculopathy 08/14/2018   PVC (premature ventricular contraction) 03/21/2018   PAD (peripheral artery disease) ([facility])    Anterior knee pain, left 12/26/2015   Hypothyroidism 07/04/2014   Nonallergic vasomotor rhinitis 07/01/2014    Hypertrophy of prostate with urinary obstruction and other lower urinary tract symptoms (LUTS) 08/08/2013   Low serum testosterone level 04/28/2013   Actinic keratosis 05/10/2010   History of malignant melanoma of skin 05/10/2010    Past Surgical History:  Procedure Laterality Date   ADENOIDECTOMY     CATARACT EXTRACTION W/PHACO Left 10/22/2017   Procedure: CATARACT EXTRACTION PHACO AND INTRAOCULAR LENS PLACEMENT ([place])  LEFT;  Surgeon: Eulogio Bear, MD;  Location: [facility];  Service: Ophthalmology;  Laterality: Left;   CATARACT EXTRACTION W/PHACO Right 11/20/2017   Procedure: CATARACT EXTRACTION PHACO AND INTRAOCULAR LENS PLACEMENT (I[facility]) RIGHT;  Surgeon: Eulogio Bear, MD;  Location: [facility];  Service: Ophthalmology;  Laterality: Right;   ESOPHAGOGASTRODUODENOSCOPY (EGD) WITH PROPOFOL N/A 10/31/2016   Procedure: ESOPHAGOGASTRODUODENOSCOPY (EGD) WITH PROPOFOL;  Surgeon: Lollie Sails, MD;  Location[facility] ENDOSCOPY;  Service: Endoscopy;  Laterality: N/A;   I & D EXTREMITY Right 11/29/2021   Procedure: IRRIGATION AND DEBRIDEMENT EXTREMITY;  Surgeon: Olean Ree, MD;  Location: [facility] ORS;  Service: General;  Laterality: Right;   JOINT REPLACEMENT Right 2003   knee   knee meniscus repair Right    MELANOMA EXCISION Right    ear. Followed by Dr. Nehemiah Massed   PATELLECTOMY Bilateral    TONSILLECTOMY         Home Medications    Prior to Admission medications   Medication Sig Start Date End Date Taking? Authorizing Provider  ARIPiprazole (ABILIFY) 2 MG tablet TAKE 1 TABLET BY MOUTH EVERY DAY 01/30/22   [name], MD  ascorbic acid (VITAMIN C) 500 MG tablet Take 500 mg by mouth daily.    [provider]  buPROPion (WELLBUTRIN SR) 150 MG 12 hr tablet Take 150 mg by mouth daily.    [provider]  chlorhexidine (HIBICLENS) 4 % external liquid Apply 1 Application topically as directed. Apply to Right lower leg topically every day  shift for use to clean and flush wound for 7 Days    [provider]  Cholecalciferol (VITAMIN D3) 50 MCG (2000 UT) TABS Take 1 tablet by mouth daily.    [provider]  cycloSPORINE (RESTASIS) 0.05 % ophthalmic emulsion Place 1 drop into both eyes 2 (two) times daily.    [provider]  ezetimibe (ZETIA) 10 MG tablet TAKE 1 TABLET BY MOUTH EVERY DAY 01/25/22   Minna Merritts, MD  fluticasone (FLONASE) 50 MCG/ACT nasal spray Place 2 sprays into both nostrils daily. 02/10/22 02/10/23  Flora Lipps, MD  levothyroxine (SYNTHROID) 100 MCG tablet TAKE 1 TABLET BY MOUTH EVERY DAY BEFORE BREAKFAST 01/23/22   [name], MD  losartan (COZAAR) 25 MG tablet Take 1 tablet (25 mg total) by mouth daily. 12/15/21   Lauree Chandler, NP  magnesium oxide (MAG-OX) 400 MG tablet Take 400 mg  by mouth daily.    [provider]  meloxicam (MOBIC) 7.5 MG tablet Take 1 tablet (7.5 mg total) by mouth daily. 12/22/21   Lauree Chandler, NP  mirtazapine (REMERON) 15 MG tablet Take 1 tablet (15 mg total) by mouth at bedtime. Patient not taking: Reported on 02/01/2022 12/15/21   Lauree Chandler, NP  rosuvastatin (CRESTOR) 10 MG tablet Take 1 tablet (10 mg total) by mouth daily. 03/15/21   [name], MD  Saccharomyces boulardii (PROBIOTIC) 250 MG CAPS Take 1 capsule by mouth daily at 12 noon.    [provider]  testosterone cypionate (DEPOTESTOSTERONE CYPIONATE) 200 MG/ML injection INJECT 0.5 ML (100 MG TOTAL) INTO THE MUSCLE EVERY 14 DAYS. 02/15/22   [name], MD  vitamin B-12 (CYANOCOBALAMIN) 500 MCG tablet Take 1 tablet by mouth daily.    [provider]    Family History Family History  Problem Relation Age of Onset   Hypertension Mother    Heart disease Mother        CHF   Heart disease Father    Cancer Sister        melanoma    Social History Social History   Tobacco Use   Smoking status: Never   Smokeless tobacco: Never   Vaping Use   Vaping Use: Never used  Substance Use Topics   Alcohol use: Yes    Comment: 1 glass of wine daily   Drug use: No     Allergies   Penicillins, Benzonatate, Lexapro [escitalopram], and Molnupiravir   Review of Systems Review of Systems  Respiratory:  Positive for cough and shortness of breath.      Physical Exam Triage Vital Signs ED Triage Vitals [03/08/22 1245]  Enc Vitals Group     BP (!) 158/77     Pulse Rate 73     Resp (!) 21     Temp 97.8 F (36.6 C)     Temp src      SpO2 97 %     Weight      Height      Head Circumference      Peak Flow      Pain Score 0     Pain Loc      Pain Edu?      Excl. in [place]?    No data found.  Updated Vital Signs BP (!) 158/77   Pulse 73   Temp 97.8 F (36.6 C)   Resp (!) 21   SpO2 97%   Visual Acuity Right Eye Distance:   Left Eye Distance:   Bilateral Distance:    Right Eye Near:   Left Eye Near:    Bilateral Near:     Physical Exam Vitals reviewed.  Constitutional:      Appearance: He is well-developed. He is ill-appearing.  Cardiovascular:     Rate and Rhythm: Normal rate and regular rhythm.  Pulmonary:     Effort: No respiratory distress.     Breath sounds: Examination of the right-upper field reveals wheezing. Wheezing present. No decreased breath sounds.  Skin:    General: Skin is warm and dry.  Neurological:     General: No focal deficit present.     Mental Status: He is alert and oriented to person, place, and time.     Cranial Nerves: Cranial nerve deficit present.  Psychiatric:        Mood and Affect: Mood normal.  Behavior: Behavior normal.      UC Treatments / Results  Labs (all labs ordered are listed, but only abnormal results are displayed) Labs Reviewed - No data to display  EKG   Radiology No results found.  Procedures Procedures (including critical care time)  Medications Ordered in UC Medications - No data to display  Initial Impression / Assessment  and Plan / UC Course  I have reviewed the triage vital signs and the nursing notes.  Pertinent labs & imaging results that were available during my care of the patient were reviewed by me and considered in my medical decision making (see chart for details).   Patient is afebrile here without recent antipyretics. Satting well on room air. Overall is well appearing, well hydrated, without respiratory distress. Pulmonary exam is remarkable for some wheezes in the upper right lobe.  Consistent with viral process, possibly causing exacerbation of his COPD.  Recent visit with his pulmonology provider indicates patient using "his own neb therapy as needed which includes "electrolytes and uses distilled water and mixes and sea salt."  Patient endorsed that this helps with chest congestion and cough.  The provider also recommended use of albuterol as needed which patient states he no longer has.  Will prescribe refill of albuterol via neb.  Also recommending budesonide via neb which will be ordered today.  Patient has follow-up scheduled with his PCP in 2 days.  Intolerance to benzonatate and will not reorder   Final Clinical Impressions(s) / UC Diagnoses   Final diagnoses:  None   Discharge Instructions   None    ED Prescriptions   None    PDMP not reviewed this encounter.   Rose Phi,  AFB 03/08/22 1319

## 2022-03-08 NOTE — Telephone Encounter (Signed)
Pt was advised to go to ED per triage nurse. Pt refused ED but stated that he would go to UC.

## 2022-03-08 NOTE — Telephone Encounter (Signed)
Pt called stating he has difficulty breathing, cough and chest is tight. Sent to access nurse

## 2022-03-08 NOTE — ED Triage Notes (Signed)
Pt. Presents to UC w/ c/o a cough, congestion and SOB for the past 3 days. Pt. Endorses worsening @ night and is unable to get any rest.

## 2022-03-08 NOTE — Discharge Instructions (Addendum)
Recommend you use the breathing treatments prescribed by your pulmonology provider.

## 2022-03-09 ENCOUNTER — Encounter: Payer: PPO | Admitting: Internal Medicine

## 2022-03-09 ENCOUNTER — Encounter: Payer: PPO | Admitting: Speech Pathology

## 2022-03-09 DIAGNOSIS — S80811A Abrasion, right lower leg, initial encounter: Secondary | ICD-10-CM | POA: Diagnosis not present

## 2022-03-09 DIAGNOSIS — S81811A Laceration without foreign body, right lower leg, initial encounter: Secondary | ICD-10-CM | POA: Diagnosis not present

## 2022-03-09 NOTE — Progress Notes (Signed)
ORVILE, CORONA A ([id number]) [id number] Page 1 of 7 Visit Report for 03/09/2022 HPI Details Patient Name: Date of Service: Theodore [name], Theodore [name] 03/09/2022 8:45 A M Medical Record Number: [id number] Patient Account Number: [id number] Date of Birth/Sex: Treating RN: [birth year] (88 y.o. Theodore [name]) [name] Primary Care Provider: [name] Other Clinician: Referring Provider: Treating Provider/Extender: RO BSO N, MICHA EL Rogelia Rohrer in Treatment: 7 History of Present Illness HPI Description: ADMISSION 10/03/2021 This is a 88 year old man who was pulling a cart 3 months ago he had some sort of imbalance issue hitting his left anterior lower leg. He is not sure exactly what he hit. He is able to show me photos on his phone that show a laceration on the left anterior lower leg in an inverted L-shaped and. He initially did very little to this himself. Saw his primary doctor on 09/21/2021 [place] instructed him to put Vaseline gauze on this which he did for short period of time. He says that things are getting a lot smaller and are improving he has not really been treating this with anything. The other major problem the patient has is peripheral arterial disease. He is followed by vein and vascular and has had a series of follow-up arterial studies. Most recently this was done on 09/30/2021 showing on the left and ABI of 0.49 with a great toe pressure of 0.47 and monophasic waveforms. This is somewhat worse than test he had done on 03/21/2021 at which time the ABI was 0.68 TBI of 0.51. I think because of his age and the fact he has underlying stage IIIa chronic kidney disease and the fact the wound is healing they have not been scheduling him for anything aggressive but follow-up studies are apparently booked for 71-month Past medical history includes coronary artery disease, lumbar radiculopathy, COPD which is limiting and stage IIIa chronic renal failure. He has PAD as  noted. He also tells me that he had a surgical procedure for essential tremor which left him with some mild dysarthria and gait and balance problems 10-10-2021 upon evaluation today patient appears to be doing decently well in regard to his wound all things considered. He is very slow to heal due to his low ABIs. With that being said it does appear that he is making some progress nonetheless there is little bit of dry skin and tissue around the edges of the wound I think a very light debridement to clear this away could be of benefit for him. The patient is in agreement with that plan. For that reason I did actually perform debridement to clear that away today but did so extremely carefully. 10-17-2021 upon evaluation today patient appears to be doing well currently in regard to his wound in fact this appears to be completely healed based on what I am seeing today. I do not see any evidence of active infection locally or systemically which is great news. No fevers, chills, nausea, vomiting, or diarrhea. Readmission: 01-13-2022 patient presents today for follow-up evaluation although the last time I saw him was actually August of this year. At that time he actually was doing decently well with regard to the wound on the left leg. Unfortunately however it was noted that he had noted poor ABIs and again I somehow missed documented the right side at the previous admission but his right ABI was 0.61 with a TBI of 0.38. Based on what I am seeing today and considering his arterial flow which is not very good I  do believe that he likely needs to be seen by vascular soon as possible I think that is contributing to his poor healing in regard to this right leg. I discussed that with him today and the individual who I am assuming to be a family member that was present with him as well although I did not inquire in order to confirm. 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 He  subsequently had an injury where he sustained a laceration this was on November 24, 2021. He then ended up with a need for reopening this with incision and drainage on 11-29-2021. I did look through the pictures that his family member had as well from the beginning through now and there has been some improvement though a lot of the granulation tissue is somewhat hypergranular there is also some evidence that the bone is drying out of the central portion of the wound. Unfortunately I do not think that this is healing nearly as well as I would like to see. I believe his blood flow is affecting this significantly and I think that needs to be addressed ASAP. Otherwise the patient's past medical history really has not changed significantly since the last time I saw him. That can be referenced above. 11/10; second visit for this wound on the right anterior upper tibia. As I understand things he initially had a fall while putting something in his attic. Initially the wound was sutured but became secondarily infected and had to be opened by general surgery culturing MSSA. He was followed by Dr. Steva Ready of infectious disease and is completed antibiotics [Keflex]. He has completed this. Currently we are using Xeroform with backing Dakin's changing this daily. He has formal arterial studies ordered for vein and vascular next Wednesday apparently in response to a ABI of 0.6 Patient is asking about Medihoney which they apparently have in the [facility] clinic. I cannot pick up any claudication by history. 01-27-2022 upon evaluation today patient's wound is doing a little bit better and overall appearance fortunately there does not appear to be any signs of infection locally or systemically at this time which is great news and overall I am extremely pleased with where we stand. No fevers, chills, nausea, vomiting, or diarrhea. He did have his arterial study which I did review today. He is ABI on the right was 0.84  with a TBI of 0.97 on the left was 0.62 with a TBI of 0.75. This actually indicates fairly good blood flow all things considered. Fortunately I do not see any evidence of significant infection at this point as well which is also good news. 02-10-2022 upon evaluation today patient appears to be doing well currently in regard to his wound. He is actually been tolerating the dressing changes without complication. Fortunately I do not see any signs of infection which is great news. With that being said I see a lot of new granulation tissue and I am actually extremely pleased with where we stand compared to last time I saw him I think that there is a lot of new growth over top of the bone area which is excellent news that is exactly what we wanted to see. I think there is probably only about 30% exposure of the bone compared to last time I saw him. 02-17-2022 upon evaluation today patient appears to be doing well currently in regard to his wound. He is slowly showing signs of covering over this bone which KEVONTAY, BURKS A ([id number]) [id number] Page  2 of 7 is good the tissue is a little hyper granulated but nonetheless does seem to be doing better than previous. 02-28-2022 upon evaluation today patient appears to be doing excellent in regard to his wound. There are still some hypergranulation but to be honest this has dramatically improved compared to where it was previous. In fact the hypergranulation has been welcomed as this actually helped to cover the bone which was previously exposed there is just a tiny area is still exposed now and overall I think were very close to complete covering. Once we get that covered my plan is to switch to [facility]. 12/28; patient arrives in clinic today with no palpable bone in the right leg wound. He has a small divot superiorly where the bone was exposed we could not identify any exposed bone today. We are using collagen and Xeroform and  Tubigrip. He does not have an arterial issue. He does arrive with very significant hypergranulation over the entirety of the wound surface perhaps 80% Electronic Signature(s) Signed: 03/09/2022 4:17:44 PM By: Linton Ham MD Entered By: Linton Ham on 03/09/2022 09:39:35 -------------------------------------------------------------------------------- Otelia Sergeant TISS Details Patient Name: Date of Service: [name]. 03/09/2022 8:45 A M Medical Record Number: [id number] Patient Account Number: [id number] Date of Birth/Sex: Treating RN: [birth year] (88 y.o. [name] Primary Care Provider: [name] Other Clinician: Referring Provider: Treating Provider/Extender: RO BSO N, MICHA EL Rogelia Rohrer in Treatment: 7 Procedure Performed for: Wound #2 Right,Midline,Anterior Lower Leg Performed By: Physician Ricard Dillon, MD Post Procedure Diagnosis Same as Pre-procedure Notes silver nitrate Electronic Signature(s) Signed: 03/09/2022 4:17:44 PM By: Linton Ham MD Previous Signature: 03/09/2022 9:37:21 AM Version By: [name] RN Entered By: Linton Ham on 03/09/2022 09:42:29 -------------------------------------------------------------------------------- Physical Exam Details Patient Name: Date of Service: Pellecchia, [name] 03/09/2022 8:45 A M Medical Record Number: [id number] Patient Account Number: [id number] Date of Birth/Sex: Treating RN: 03/07/1930 (88 y.o. [name] Primary Care Provider: [name] Other Clinician: Referring Provider: Treating Provider/Extender: RO BSO Delane Ginger, MICHA EL Rogelia Rohrer in Treatment: Leando, Gabor A ([id number]) [id number] Page 3 of 7 Constitutional Sitting or standing Blood Pressure is within target range for patient.. Pulse regular and within target range for patient.[name] Respirations regular, non-labored and within target range.. Temperature is normal and  within the target range for the patient.[name] appears in no distress. Notes Wound exam; the area in question look healthy albeit with hypergranulation of healthy healthy looking granulation tissue over the majority of the wound surface. The divot proximally where the bone was exposed has granulation over the top. No evidence of infection no purulence. No problems with edema Electronic Signature(s) Signed: 03/09/2022 4:17:44 PM By: Linton Ham MD Entered By: Linton Ham on 03/09/2022 09:40:25 -------------------------------------------------------------------------------- Physician Orders Details Patient Name: Date of Service: Ledell Noss A. 03/09/2022 8:45 A M Medical Record Number: [id number] Patient Account Number: [id number] Date of Birth/Sex: Treating RN: [birth year] (88 y.o. [name] Primary Care Provider: [name] Other Clinician: Referring Provider: Treating Provider/Extender: RO BSO N, MICHA EL Rogelia Rohrer in Treatment: 7 Verbal / Phone Orders: No Diagnosis Coding Follow-up Appointments ppointment in 1 week. - patient seen at [facility] Fax [phone] Return A Bathing/ Shower/ Hygiene Wash wounds with antibacterial soap and water. May shower; gently cleanse wound with antibacterial soap, rinse and pat dry prior to dressing wounds Anesthetic (Use 'Patient Medications' Section for Anesthetic Order Entry) Lidocaine applied to  wound bed Edema Control - Lymphedema / Segmental Compressive Device / Other Elevate, Exercise Daily and A void Standing for Long Periods of Time. Elevate legs to the level of the heart and pump ankles as often as possible Elevate leg(s) parallel to the floor when sitting. Wound Treatment Wound #2 - Lower Leg Wound Laterality: Right, Midline, Anterior Cleanser: Soap and Water Every Other Day/30 Days Discharge Instructions: Gently cleanse wound with antibacterial soap, rinse and pat dry prior to dressing wounds Prim  Dressing: Hydrofera Blue Ready Transfer Foam, 4x5 (in/in) Every Other Day/30 Days ary Discharge Instructions: Apply Hydrofera Blue Ready to wound bed as directed Secured With: Kerlix Roll Sterile or Non-Sterile 6-ply 4.5x4 (yd/yd) Every Other Day/30 Days Discharge Instructions: Apply Kerlix as directed Secured With: Tubigrip Size D, 3x10 (in/yd) Every Other Day/30 Days Discharge Instructions: single layer Electronic Signature(s) Signed: 03/09/2022 9:37:21 AM By: [name] RN Signed: 03/09/2022 4:17:44 PM By: Linton Ham MD Entered By: [name] on 03/09/2022 09:06:27 Delossantos, Shai A ([id number]) [id number] Page 4 of 7 -------------------------------------------------------------------------------- Problem List Details Patient Name: Date of Service: SHAQUON, GROPP 03/09/2022 8:45 A M Medical Record Number: [id number] Patient Account Number: [id number] Date of Birth/Sex: Treating RN: October 13, 1929 (88 y.o. 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 03/09/2022 8:45 A M Medical Record Number: [id number] Patient Account Number: [id number] Date of Birth/Sex:  Treating RN: [birth year] (88 y.o. [name] Primary Care Provider: [name] Other Clinician: Referring Provider: Treating Provider/Extender: RO BSO N, MICHA EL Rogelia Rohrer in Treatment: 7 Subjective History of Present Illness (HPI) Winfree, Case A ([id number]) [id number] Page 5 of 7 ADMISSION 10/03/2021 This is a 88 year old man who was pulling a cart 3 months ago he had some sort of imbalance issue hitting his left anterior lower leg. He is not sure exactly what he hit. He is able to show me photos on his phone that show a laceration on the left anterior lower leg in an inverted L-shaped and. He initially did very little to this himself. Saw his primary doctor on 09/21/2021 [place] instructed him to put Vaseline gauze on this which he did for short period of time. He says that things are getting a lot smaller and are improving he has not really been treating this with anything. The other major problem the patient has is peripheral arterial disease. He is followed by vein and vascular and has had a series of follow-up arterial studies. Most recently this was done on 09/30/2021 showing on the left and ABI of 0.49 with a great toe pressure of 0.47 and monophasic waveforms. This is somewhat worse than test he had done on 03/21/2021 at which time the ABI was 0.68 TBI of 0.51. I think because of his age and the fact he has underlying stage IIIa chronic kidney disease and the fact the wound is healing they have not been  scheduling him for anything aggressive but follow-up studies are apparently booked for 69-month Past medical history includes coronary artery disease, lumbar radiculopathy, COPD which is limiting and stage IIIa chronic renal failure. He has PAD as noted. He also tells me that he had a surgical procedure for essential tremor which left him with some mild dysarthria and gait and balance problems 10-10-2021 upon evaluation today patient appears to  be doing decently well in regard to his wound all things considered. He is very slow to heal due to his low ABIs. With that being said it does appear that he is making some progress nonetheless there is little bit of dry skin and tissue around the edges of the wound I think a very light debridement to clear this away could be of benefit for him. The patient is in agreement with that plan. For that reason I did actually perform debridement to clear that away today but did so extremely carefully. 10-17-2021 upon evaluation today patient appears to be doing well currently in regard to his wound in fact this appears to be completely healed based on what I am seeing today. I do not see any evidence of active infection locally or systemically which is great news. No fevers, chills, nausea, vomiting, or diarrhea. Readmission: 01-13-2022 patient presents today for follow-up evaluation although the last time I saw him was actually August of this year. At that time he actually was doing decently well with regard to the wound on the left leg. Unfortunately however it was noted that he had noted poor ABIs and again I somehow missed documented the right side at the previous admission but his right ABI was 0.61 with a TBI of 0.38. Based on what I am seeing today and considering his arterial flow which is not very good I do believe that he likely needs to be seen by vascular soon as possible I think that is contributing to his poor healing in regard to this right leg. I discussed that with him today and the individual who I am assuming to be a family member that was present with him as well although I did not inquire in order to confirm. 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 He subsequently had an injury where he sustained a laceration this was on November 24, 2021. He then ended up with a need for reopening this with incision and drainage on 11-29-2021. I did look through  the pictures that his family member had as well from the beginning through now and there has been some improvement though a lot of the granulation tissue is somewhat hypergranular there is also some evidence that the bone is drying out of the central portion of the wound. Unfortunately I do not think that this is healing nearly as well as I would like to see. I believe his blood flow is affecting this significantly and I think that needs to be addressed ASAP. Otherwise the patient's past medical history really has not changed significantly since the last time I saw him. That can be referenced above. 11/10; second visit for this wound on the right anterior upper tibia. As I understand things he initially had a fall while putting something in his attic. Initially the wound was sutured but became secondarily infected and had to be opened by general surgery culturing MSSA. He was followed by Dr. RSteva Readyof infectious disease and is completed antibiotics [Keflex]. He has completed this. Currently we are using Xeroform with backing Dakin's changing  this daily. He has formal arterial studies ordered for vein and vascular next Wednesday apparently in response to a ABI of 0.6 Patient is asking about Medihoney which they apparently have in the [facility] clinic. I cannot pick up any claudication by history. 01-27-2022 upon evaluation today patient's wound is doing a little bit better and overall appearance fortunately there does not appear to be any signs of infection locally or systemically at this time which is great news and overall I am extremely pleased with where we stand. No fevers, chills, nausea, vomiting, or diarrhea. He did have his arterial study which I did review today. He is ABI on the right was 0.84 with a TBI of 0.97 on the left was 0.62 with a TBI of 0.75. This actually indicates fairly good blood flow all things considered. Fortunately I do not see any evidence of significant infection at  this point as well which is also good news. 02-10-2022 upon evaluation today patient appears to be doing well currently in regard to his wound. He is actually been tolerating the dressing changes without complication. Fortunately I do not see any signs of infection which is great news. With that being said I see a lot of new granulation tissue and I am actually extremely pleased with where we stand compared to last time I saw him I think that there is a lot of new growth over top of the bone area which is excellent news that is exactly what we wanted to see. I think there is probably only about 30% exposure of the bone compared to last time I saw him. 02-17-2022 upon evaluation today patient appears to be doing well currently in regard to his wound. He is slowly showing signs of covering over this bone which is good the tissue is a little hyper granulated but nonetheless does seem to be doing better than previous. 02-28-2022 upon evaluation today patient appears to be doing excellent in regard to his wound. There are still some hypergranulation but to be honest this has dramatically improved compared to where it was previous. In fact the hypergranulation has been welcomed as this actually helped to cover the bone which was previously exposed there is just a tiny area is still exposed now and overall I think were very close to complete covering. Once we get that covered my plan is to switch to [facility]. 12/28; patient arrives in clinic today with no palpable bone in the right leg wound. He has a small divot superiorly where the bone was exposed we could not identify any exposed bone today. We are using collagen and Xeroform and Tubigrip. He does not have an arterial issue. He does arrive with very significant hypergranulation over the entirety of the wound surface perhaps 80% Objective Constitutional Sitting or standing Blood Pressure is within target range for patient.. Pulse regular and within  target range for patient.[name] Respirations regular, non-labored and within target range.. Temperature is normal and within the target range for the patient.[name] appears in no distress. Vitals Time Taken: 8:54 AM, Height: 75 in, Weight: 168 lbs, BMI: 21, Temperature: 97.7 F, Pulse: 74 bpm, Respiratory Rate: 18 breaths/min, Blood Pressure: 125/60 mmHg. General Notes: Wound exam; the area in question look healthy albeit with hypergranulation of healthy healthy looking granulation tissue over the majority of the wound surface. The divot proximally where the bone was exposed has granulation over the top. No evidence of infection no purulence. No problems with edema RAIHAN, KIMMEL A ([id number]) [id number] Page  6 of 7 Integumentary (Hair, Skin) Wound #2 status is Open. Original cause of wound was Laceration. The date acquired was: 11/24/2021. The wound has been in treatment 7 weeks. The wound is located on the Right,Midline,Anterior Lower Leg. The wound measures 4cm length x 1cm width x 0.2cm depth; 3.142cm^2 area and 0.628cm^3 volume. There is bone and Fat Layer (Subcutaneous Tissue) exposed. There is no tunneling or undermining noted. There is a medium amount of serosanguineous drainage noted. There is small (1-33%) red, hyper - granulation within the wound bed. There is a small (1-33%) amount of necrotic tissue within the wound bed including Adherent Slough and Necrosis of Bone. 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 An CHEM CAUT GRANULATION TISS procedure was ear performed by Ricard Dillon, MD. Post procedure Diagnosis Wound #2: Same as Pre-Procedure Notes:  silver nitrate Plan Follow-up Appointments: Return Appointment in 1 week. - patient seen at [facility] Fax [phone] Bathing/ Shower/ Hygiene: Wash wounds with antibacterial soap and water. May shower; gently cleanse wound with antibacterial soap, rinse and pat dry prior to dressing wounds Anesthetic (Use 'Patient Medications' Section for Anesthetic Order Entry): Lidocaine applied to wound bed Edema Control - Lymphedema / Segmental Compressive Device / Other: Elevate, Exercise Daily and Avoid Standing for Long Periods of Time. Elevate legs to the level of the heart and pump ankles as often as possible Elevate leg(s) parallel to the floor when sitting. WOUND #2: - Lower Leg Wound Laterality: Right, Midline, Anterior Cleanser: Soap and Water Every Other Day/30 Days Discharge Instructions: Gently cleanse wound with antibacterial soap, rinse and pat dry prior to dressing wounds Prim Dressing: Hydrofera Blue Ready Transfer Foam, 4x5 (in/in) Every Other Day/30 Days ary Discharge Instructions: Apply Hydrofera Blue Ready to wound bed as directed Secured With: Kerlix Roll Sterile or Non-Sterile 6-ply 4.5x4 (yd/yd) Every Other Day/30 Days Discharge Instructions: Apply Kerlix as directed Secured With: Tubigrip Size D, 3x10 (in/yd) Every Other Day/30 Days Discharge Instructions: single layer 1. I used silver nitrate on the wound surface to help with the hypergranulation. [place] by the fact that he does not have exposed bone I change the primary dressing to [facility] which is helpful with hypergranulation. He has a rim of epithelialization 3. Before the patient left the clinic we noticed that he has excoriations on several toes on the right foot as well as a blister on the right fifth toe. He does not have any socks on his toes wearing these in shoes which I have asked him to change to a sandal to avoid any pressure/friction Electronic Signature(s) Signed: 03/09/2022 4:17:44 PM By:  Linton Ham MD Entered By: Linton Ham on 03/09/2022 09:41:39 Olinda, [place] A ([id number]) [id number] Page 7 of 7 -------------------------------------------------------------------------------- SuperBill Details Patient Name: Date of Service: XZANDER, GILHAM 03/09/2022 Medical Record Number: [id number] Patient Account Number: [id number] Date of Birth/Sex: Treating RN: 1930-01-15 (88 y.o. 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 Facility Procedures : CPT4 Code: [id number] Description: [place] TISS ICD-10 Diagnosis Description M07.680 Non-pressure chronic ulcer of other part of right lower leg with fat layer expo Modifier: sed Quantity: 1 Physician Procedures : CPT4 Code Description Modifier [id number] [id number] - WC PHYS CHEM CAUT GRAN TISSUE ICD-10 Diagnosis Description R94.585 Non-pressure chronic ulcer of other part of right lower leg with fat layer exposed Quantity: 1 Electronic Signature(s) Signed: 03/09/2022 4:17:44 PM By: Linton Ham MD Previous Signature: 03/09/2022 9:37:21 AM Version By: [name] RN Entered By: Linton Ham on 03/09/2022 09:42:07

## 2022-03-09 NOTE — Progress Notes (Signed)
Theodore, Theodore [name] ([id number]) [id number] Page 1 of 9 Visit Report for 03/09/2022 Arrival Information Details Patient Name: Date of Service: Theodore [name], Theodore [name] 03/09/2022 8:45 [name] Medical Record Number: [id number] Patient Account Number: [id number] Date of Birth/Sex: Treating RN: [birth year] (88 y.o. Theodore [name]) Theodore [name] Primary Care Theodore [name]: Theodore [name] Other Clinician: Referring Theodore [name]: Treating Theodore [name]/Extender: Theodore BSO N, Theodore [name] in Treatment: 7 Visit Information History Since Last Visit Added or deleted any medications: No Patient Arrived: Ambulatory Any new allergies or adverse reactions: No Arrival Time: 08:53 Had [name] fall or experienced change in No Accompanied By: wife activities of daily living that may affect Transfer Assistance: None risk of falls: Patient Identification Verified: Yes Signs or symptoms of abuse/neglect since last visito No Secondary Verification Process Completed: Yes Hospitalized since last visit: No Patient Requires Transmission-Based Precautions: No Implantable device outside of the clinic excluding No Patient Has Alerts: Yes cellular tissue based products placed in the center Patient Alerts: ABI R .61 09/30/21 TBI .38 since last visit: ABI L .49 09/30/21 TBI .90 Has Dressing in Place as Prescribed: Yes Has Compression in Place as Prescribed: Yes Pain Present Now: No Electronic Signature(s) Signed: 03/09/2022 9:37:21 AM By: Theodore Coria RN Entered By: Theodore [name] on 03/09/2022 08:53:48 -------------------------------------------------------------------------------- Clinic Level of Care Assessment Details Patient Name: Date of Service: Theodore, [name] 03/09/2022 8:45 [name] Medical Record Number: [id number] Patient Account Number: [id number] Date of Birth/Sex: Treating RN: Jun 11, 1929 (88 y.o. Theodore [name]) Theodore [name] Primary Care Rolinda Impson: Theodore [name] Other Clinician: Referring Theodore [name]: Treating  Theodore [name]/Extender: Theodore BSO N, Theodore [name] in Treatment: 7 Clinic Level of Care Assessment Items TOOL 1 Quantity Score _0  - 0 Use when EandM and Procedure is performed on INITIAL visit ASSESSMENTS - Nursing Assessment / Reassessment _1  - 0 General Physical Exam (combine w/ comprehensive assessment (listed just below) when performed on new pt. evals) _2  - 0 Comprehensive Assessment (HX, ROS, Risk Assessments, Wounds Hx, etc.) Theodore [name], Theodore [name] ([id number]) [id number] Page 2 of 9 ASSESSMENTS - Wound and Skin Assessment / Reassessment _3  - 0 Dermatologic / Skin Assessment (not related to wound area) ASSESSMENTS - Ostomy and/or Continence Assessment and Care _4  - 0 Incontinence Assessment and Management _5  - 0 Ostomy Care Assessment and Management (repouching, etc.) PROCESS - Coordination of Care _6  - 0 Simple Patient / Family Education for ongoing care _7  - 0 Complex (extensive) Patient / Family Education for ongoing care _8  - 0 Staff obtains Programmer, systems, Records, T Results / Process Orders est _9  - 0 Staff telephones HHA, Nursing Homes / Clarify orders / etc _10  - 0 Routine Transfer to another Facility (non-emergent condition) _11  - 0 Routine Hospital Admission (non-emergent condition) _12  - 0 New Admissions / Biomedical engineer / Ordering NPWT Apligraf, etc. , _13  - 0 Emergency Hospital Admission (emergent condition) PROCESS - Special Needs _14  - 0 Pediatric / Minor Patient Management _15  - 0 Isolation Patient Management _16  - 0 Hearing / Language / Visual special needs _17  - 0 Assessment of Community assistance (transportation, D/C planning, etc.) _18  - 0 Additional assistance / Altered mentation _19  - 0 Support Surface(s) Assessment (bed, cushion, seat, etc.) INTERVENTIONS - Miscellaneous _20  - 0 External ear exam _21  - 0 Patient Transfer (multiple staff / Civil Service fast streamer / Similar devices) _22  - 0 Simple Staple / Suture removal (25  or less) _23  - 0 Complex Staple / Suture removal (26 or more) _24  - 0 Hypo/Hyperglycemic Management (do not  check if billed separately) _0  - 0 Ankle / Brachial Index (ABI) - do not check if billed separately Has the patient been seen at the hospital within the last three years: Yes Total Score: 0 Level Of Care: ____ Electronic Signature(s) Signed: 03/09/2022 9:37:21 AM By: Theodore Coria RN Entered By: Theodore [name] on 03/09/2022 09:06:46 -------------------------------------------------------------------------------- Encounter Discharge Information Details Patient Name: Date of Service: Theodore [name]. 03/09/2022 8:45 [name] Medical Record Number: [id number] Patient Account Number: [id number] Date of Birth/Sex: Treating RN: 1929-06-30 (88 y.o. Theodore [name] Primary Care Perrin Gens: Theodore [name] Other Clinician: Referring Angelly Spearing: Treating Theodore [name]/Extender: [address], Theodore EL Theodore, [name] ([id number]) [id number] Page 3 of 9 Weeks in Treatment: 7 Encounter Discharge Information Items Discharge Condition: Stable Ambulatory Status: Walker Discharge Destination: Home Transportation: Private Auto Accompanied By: wife Schedule Follow-up Appointment: Yes Clinical Summary of Care: Electronic Signature(s) Signed: 03/09/2022 9:37:21 AM By: Theodore Coria RN Entered By: Theodore [name] on 03/09/2022 09:30:12 -------------------------------------------------------------------------------- Lower Extremity Assessment Details Patient Name: Date of Service: KRYSTLE, OBERMAN 03/09/2022 8:45 [name] Medical Record Number: [id number] Patient Account Number: [id number] Date of Birth/Sex: Treating RN: 12/03/1929 (88 y.o. Theodore [name] Primary Care Avaleigh Decuir: Theodore [name] Other Clinician: Referring Luisana Lutzke: Treating Libertie Hausler/Extender: Theodore BSO N, Theodore [name] in Treatment: 7 Vascular Assessment Pulses: Dorsalis Pedis Palpable:  [Right:Yes] Electronic Signature(s) Signed: 03/09/2022 9:37:21 AM By: Theodore Coria RN Entered By: Theodore [name] on 03/09/2022 09:01:55 -------------------------------------------------------------------------------- Multi Wound Chart Details Patient Name: Date of Service: Theodore [name]. 03/09/2022 8:45 [name] Medical Record Number: [id number] Patient Account Number: [id number] Date of Birth/Sex: Treating RN: 1929/09/15 (88 y.o. Theodore [name] Primary Care Ayeden Gladman: Theodore [name] Other Clinician: Referring Elexus Barman: Treating Jeydan Barner/Extender: Theodore BSO N, Theodore [name] in Treatment: 7 Vital Signs Height(in): 75 Pulse(bpm): [place], [place] ([id number]) [id number] Page 4 of 9 Weight(lbs): 168 Blood Pressure(mmHg): 125/60 Body Mass Index(BMI): 21 Temperature(F): 97.7 Respiratory Rate(breaths/min): 18 [2:Photos:] [N/[name]:N/[name]] Right, Midline, Anterior Lower Leg N/[name] N/[name] Wound Location: Laceration N/[name] N/[name] Wounding Event: Skin T ear N/[name] N/[name] Primary Etiology: Trauma, Other N/[name] N/[name] Secondary Etiology: Chronic Obstructive Pulmonary N/[name] N/[name] Comorbid History: Disease (COPD), Peripheral Arterial Disease 11/24/2021 N/[name] N/[name] Date Acquired: 7 N/[name] N/[name] Weeks of Treatment: Open N/[name] N/[name] Wound Status: No N/[name] N/[name] Wound Recurrence: 4x1x0.2 N/[name] N/[name] Measurements L x W x D (cm) 3.142 N/[name] N/[name] [name] (cm) : rea 0.628 N/[name] N/[name] Volume (cm) : 67.90% N/[name] N/[name] % Reduction in Area: 90.80% N/[name] N/[name] % Reduction in Volume: Full Thickness With Exposed Support N/[name] N/[name] Classification: Structures Medium N/[name] N/[name] Exudate Amount: Serosanguineous N/[name] N/[name] Exudate Type: red, brown N/[name] N/[name] Exudate Color: Small (1-33%) N/[name] N/[name] Granulation Amount: Red, Hyper-granulation N/[name] N/[name] Granulation Quality: Small (1-33%) N/[name] N/[name] Necrotic Amount: Fat Layer (Subcutaneous Tissue): Yes N/[name] N/[name] Exposed Structures: Bone: Yes Fascia: No Tendon: No Muscle: No Joint: No Small  (1-33%) N/[name] N/[name] Epithelialization: Treatment Notes Electronic Signature(s) Signed: 03/09/2022 9:37:21 AM By: Theodore Coria RN Entered By: Theodore [name] on 03/09/2022 09:02:01 -------------------------------------------------------------------------------- Multi-Disciplinary Care Plan Details Patient Name: Date of Service: Theodore [name]. 03/09/2022 8:45 [name] Medical Record Number: [id number] Patient Account Number: [id number] Date of Birth/Sex: Treating RN: [birth year] (88 y.o. Theodore [name] Primary Care Danielys Madry: Theodore [name] Other Clinician: Referring Ahaan Zobrist: Treating Kharizma Lesnick/Extender: Theodore BSO N, Theodore [name] in Treatment: [place], Zyen [name] ([id number]) [id number] Page 5 of 9 Active  Inactive Necrotic Tissue Nursing Diagnoses: Impaired tissue integrity related to necrotic/devitalized tissue Knowledge deficit related to management of necrotic/devitalized tissue Goals: Necrotic/devitalized tissue will be minimized in the wound bed Date Initiated: 01/20/2022 Target Resolution Date: 02/19/2022 Goal Status: Active Patient/caregiver will verbalize understanding of reason and process for debridement of necrotic tissue Date Initiated: 01/20/2022 Target Resolution Date: 02/19/2022 Goal Status: Active Interventions: Assess patient pain level pre-, during and post procedure and prior to discharge Provide education on necrotic tissue and debridement process Treatment Activities: Apply topical anesthetic as ordered : 01/20/2022 Excisional debridement : 01/20/2022 Notes: Wound/Skin Impairment Nursing Diagnoses: Impaired tissue integrity Knowledge deficit related to smoking impact on wound healing Knowledge deficit related to ulceration/compromised skin integrity Goals: Patient will demonstrate [name] reduced rate of smoking or cessation of smoking Date Initiated: 01/13/2022 Date Inactivated: 02/17/2022 Target Resolution Date: 02/12/2022 Goal  Status: Met Patient will have [name] decrease in wound volume by X% from date: (specify in notes) Date Initiated: 01/13/2022 Date Inactivated: 02/17/2022 Target Resolution Date: 02/12/2022 Goal Status: Met Patient/caregiver will verbalize understanding of skin care regimen Date Initiated: 01/13/2022 Target Resolution Date: 02/12/2022 Goal Status: Active Ulcer/skin breakdown will have [name] volume reduction of 30% by week 4 Date Initiated: 01/13/2022 Target Resolution Date: 02/12/2022 Goal Status: Active Ulcer/skin breakdown will have [name] volume reduction of 50% by week 8 Date Initiated: 01/13/2022 Target Resolution Date: 03/15/2022 Goal Status: Active Ulcer/skin breakdown will have [name] volume reduction of 80% by week 12 Date Initiated: 01/13/2022 Target Resolution Date: 04/15/2022 Goal Status: Active Ulcer/skin breakdown will heal within 14 weeks Date Initiated: 01/13/2022 Target Resolution Date: 05/14/2022 Goal Status: Active Interventions: Assess patient/caregiver ability to obtain necessary supplies Assess patient/caregiver ability to perform ulcer/skin care regimen upon admission and as needed Assess ulceration(s) every visit Provide education on ulcer and skin care Screen for HBO Treatment Activities: Skin care regimen initiated : 01/13/2022 Topical wound management initiated : 01/13/2022 Notes: Electronic Signature(s) Signed: 03/09/2022 9:37:21 AM By: Theodore Coria RN Entered By: Theodore [name] on 03/09/2022 09:07:55 Theodore [name], Theodore [name] ([id number]) [id number] Page 6 of 9 -------------------------------------------------------------------------------- Pain Assessment Details Patient Name: Date of Service: Theodore [name], Theodore [name] 03/09/2022 8:45 [name] Medical Record Number: [id number] Patient Account Number: [id number] Date of Birth/Sex: Treating RN: March 27, 1929 (88 y.o. Theodore [name]) Theodore [name] Primary Care Aalliyah Kilker: Theodore [name] Other Clinician: Referring Bryam Taborda: Treating Joban Colledge/Extender:  Theodore BSO N, Theodore [name] in Treatment: 7 Active Problems Location of Pain Severity and Description of Pain Patient Has Paino No Site Locations Pain Management and Medication Current Pain Management: Electronic Signature(s) Signed: 03/09/2022 9:37:21 AM By: Theodore Coria RN Entered By: Theodore [name] on 03/09/2022 08:59:53 -------------------------------------------------------------------------------- Patient/Caregiver Education Details Patient Name: Date of Service: Theodore [name] 12/28/2023andnbsp8:45 [place] Record Number: [id number] Patient Account Number: [id number] Date of Birth/Gender: Treating RN: 09/22/1929 (88 y.o. Theodore [name] Primary Care Physician: Theodore [name] Other Clinician: Referring Physician: Treating Physician/Extender: Theodore BSO N, Theodore [name] in Treatment: [place], [place] ([id number]) [id number] Page 7 of 9 Education Assessment Education Provided To: Patient Education Topics Provided Wound/Skin Impairment: Methods: Explain/Verbal Responses: State content correctly Electronic Signature(s) Signed: 03/09/2022 9:37:21 AM By: Theodore Coria RN Entered By: Theodore [name] on 03/09/2022 09:07:17 -------------------------------------------------------------------------------- Wound Assessment Details Patient Name: Date of Service: Theodore [name], Theodore [name] 03/09/2022 8:45 [name] Medical Record Number: [id number] Patient Account Number: [id number] Date of Birth/Sex: Treating RN: [birth year] (88 y.o. Theodore [name] Primary Care Elease Swarm: Theodore [name] Other Clinician: Referring Ronelle Michie:  Treating Jamesia Linnen/Extender: Theodore BSO N, Theodore EL [place] Weeks in Treatment: 7 Wound Status Wound Number: 2 Primary Skin Tear Etiology: Wound Location: Right, Midline, Anterior Lower Leg Secondary Trauma, Other Wounding Event: Laceration Etiology: Date Acquired: 11/24/2021 Wound Status: Open Weeks Of Treatment:  7 Comorbid Chronic Obstructive Pulmonary Disease (COPD), Peripheral Clustered Wound: No History: Arterial Disease Photos Wound Measurements Length: (cm) 4 Width: (cm) 1 Depth: (cm) 0.2 Area: (cm) 3.142 Volume: (cm) 0.628 % Reduction in Area: 67.9% % Reduction in Volume: 90.8% Epithelialization: Small (1-33%) Tunneling: No Undermining: No Wound Description Classification: Full Thickness With Exposed Support Structures Exudate Amount: Medium Exudate Type: Serosanguineous Mckercher, Theodore [name] ([id number]) Exudate Color: red, brown Foul Odor After Cleansing: No Slough/Fibrino Yes [id number] Page 8 of 9 Wound Bed Granulation Amount: Small (1-33%) Exposed Structure Granulation Quality: Red, Hyper-granulation Fascia Exposed: No Necrotic Amount: Small (1-33%) Fat Layer (Subcutaneous Tissue) Exposed: Yes Necrotic Quality: Adherent Slough, Bone Tendon Exposed: No Muscle Exposed: No Joint Exposed: No Bone Exposed: Yes Treatment Notes Wound #2 (Lower Leg) Wound Laterality: Right, Midline, Anterior Cleanser Soap and Water Discharge Instruction: Gently cleanse wound with antibacterial soap, rinse and pat dry prior to dressing wounds Peri-Wound Care Topical Primary Dressing Hydrofera Blue Ready Transfer Foam, 4x5 (in/in) Discharge Instruction: Apply Hydrofera Blue Ready to wound bed as directed Secondary Dressing Secured With Kerlix Roll Sterile or Non-Sterile 6-ply 4.5x4 (yd/yd) Discharge Instruction: Apply Kerlix as directed Tubigrip Size D, 3x10 (in/yd) Discharge Instruction: single layer Compression Wrap Compression Stockings Add-Ons Electronic Signature(s) Signed: 03/09/2022 9:37:21 AM By: Theodore Coria RN Entered By: Theodore [name] on 03/09/2022 09:01:39 -------------------------------------------------------------------------------- Vitals Details Patient Name: Date of Service: Theodore [name], Theodore [name]. 03/09/2022 8:45 [name] Medical Record Number:  [id number] Patient Account Number: [id number] Date of Birth/Sex: Treating RN: 20-Jan-1930 (88 y.o. Theodore [name]) Theodore [name] Primary Care Jackye Dever: Theodore [name] Other Clinician: Referring Ziona Wickens: Treating Abel Hageman/Extender: Theodore BSO N, Theodore [name] in Treatment: 7 Vital Signs Time Taken: 08:54 Temperature (F): 97.7 Height (in): 75 Pulse (bpm): 74 Weight (lbs): 168 Respiratory Rate (breaths/min): 18 Body Mass Index (BMI): 21 Blood Pressure (mmHg): 125/60 Reference Range: 80 - 120 mg / dl Theodore [name], Theodore [name] ([id number]) [id number] Page 9 of 9 Electronic Signature(s) Signed: 03/09/2022 9:37:21 AM By: Theodore Coria RN Entered By: Theodore [name] on 03/09/2022 08:54:37

## 2022-03-10 ENCOUNTER — Telehealth (INDEPENDENT_AMBULATORY_CARE_PROVIDER_SITE_OTHER): Payer: PPO | Admitting: Internal Medicine

## 2022-03-10 ENCOUNTER — Encounter: Payer: Self-pay | Admitting: Internal Medicine

## 2022-03-10 ENCOUNTER — Ambulatory Visit: Payer: PPO | Admitting: Speech Pathology

## 2022-03-10 VITALS — Temp 97.0°F | Ht 74.0 in | Wt 168.0 lb

## 2022-03-10 DIAGNOSIS — U071 COVID-19: Secondary | ICD-10-CM | POA: Diagnosis not present

## 2022-03-10 NOTE — Progress Notes (Unsigned)
Virtual Visit via [place]   Note   This format is felt to be most appropriate for this patient at this time.  All issues noted in this document were discussed and addressed.  No physical exam was performed (except for noted visual exam findings with Video Visits).   I connected with Theodore [name] on 03/10/22 at  4:00 PM EST by a video enabled telemedicine application or telephone and verified that I am speaking with the correct person using two identifiers. Location patient: home Location provider: work or home office Persons participating in the virtual visit: patient, provider  I discussed the limitations, risks, security and privacy concerns of performing an evaluation and management service by telephone and the availability of in person appointments. I also discussed with the patient that there may be a patient responsible charge related to this service. The patient expressed understanding and agreed to proceed.  Reason for visit: COVID INFECTION   HPI:  Symptoms started on Dec 24 ,  treated at urgent care several days ago, was not testedfor COVID . Leg weakness followed by chest tightness and cough.  Given budesonide and albuterol nebs.  Tested positive for COVID on dec 28.  All symptoms improving. Wife [name] now has a sore throat    ROS: See pertinent positives and negatives per HPI.  Past Medical History:  Diagnosis Date   Actinic keratosis    Arthritis    knees   Asymptomatic Sinus bradycardia    a. 05/2016 Zio: Avg HR 61 (41-167).   Benign essential tremor    Cancer ([place]) 2002   melanoma right ear   Colon polyp    COPD (chronic obstructive pulmonary disease) ([place])    pt said he believes it is a misdiagnosis    Coronary artery calcification seen on CT scan    a. 06/2016 CTA Chest: cor Ca2+; b. 05/2017 MV: EF >65%. No ischemia/infarct.   Depression    History of echocardiogram    a. 04/2016 Echo: EF 60-65%, mild conc LVH. Nl PASP.   History of kidney stones    Hx of dysplastic  nevus 05/07/2017   L anterior shoulder, severe   Hx of dysplastic nevus 09/06/2020   R lower sternum, moderate atypia   Hypothyroidism    Hypothyroidism    Laceration of right lower extremity    Melanoma ([place]) 1990's per pt   R ear   Melanoma in situ ([place]) 01/12/2014   left jaw   MSSA infection, non-invasive    Palpitations    a. 05/2016 Zio: Avg HR 61 (41-167). 9 SVT runs (fastest 167 - 5 beats; longest 8 beats - 101 bpm). Rare PACs/PVCs.   PVC's (premature ventricular contractions)    Rhinitis    Sepsis due to cellulitis ([place]) 11/28/2021   Severe sepsis ([place]) 11/28/2021   Wears dentures    partial upper and lower   Wears hearing aid in both ears     Past Surgical History:  Procedure Laterality Date   ADENOIDECTOMY     CATARACT EXTRACTION W/PHACO Left 10/22/2017   Procedure: CATARACT EXTRACTION PHACO AND INTRAOCULAR LENS PLACEMENT ([place])  LEFT;  Surgeon: Eulogio Bear, MD;  Location: [facility];  Service: Ophthalmology;  Laterality: Left;   CATARACT EXTRACTION W/PHACO Right 11/20/2017   Procedure: CATARACT EXTRACTION PHACO AND INTRAOCULAR LENS PLACEMENT (IOC) RIGHT;  Surgeon: Eulogio Bear, MD;  Location: [facility];  Service: Ophthalmology;  Laterality: Right;   ESOPHAGOGASTRODUODENOSCOPY (EGD) WITH PROPOFOL N/A 10/31/2016   Procedure: ESOPHAGOGASTRODUODENOSCOPY (  EGD) WITH PROPOFOL;  Surgeon: Lollie Sails, MD;  Location: [facility] ENDOSCOPY;  Service: Endoscopy;  Laterality: N/A;   I & D EXTREMITY Right 11/29/2021   Procedure: IRRIGATION AND DEBRIDEMENT EXTREMITY;  Surgeon: Olean Ree, MD;  Location: [facility] ORS;  Service: General;  Laterality: Right;   JOINT REPLACEMENT Right 2003   knee   knee meniscus repair Right    MELANOMA EXCISION Right    ear. Followed by Dr. Nehemiah Massed   PATELLECTOMY Bilateral    TONSILLECTOMY      Family History  Problem Relation Age of Onset   Hypertension Mother    Heart disease Mother        CHF   Heart disease  Father    Cancer Sister        melanoma    SOCIAL HX: ***   Current Outpatient Medications:    albuterol (PROVENTIL) (2.5 MG/3ML) 0.083% nebulizer solution, Take 3 mLs (2.5 mg total) by nebulization every 6 (six) hours as needed for wheezing or shortness of breath., Disp: 60 mL, Rfl: 0   ascorbic acid (VITAMIN C) 500 MG tablet, Take 500 mg by mouth daily., Disp: , Rfl:    budesonide (PULMICORT) 0.5 MG/2ML nebulizer solution, Take 2 mLs (0.5 mg total) by nebulization in the morning and at bedtime for 7 days., Disp: 28 mL, Rfl: 0   buPROPion (WELLBUTRIN SR) 150 MG 12 hr tablet, Take 150 mg by mouth daily., Disp: , Rfl:    Cholecalciferol (VITAMIN D3) 50 MCG (2000 UT) TABS, Take 1 tablet by mouth daily., Disp: , Rfl:    cycloSPORINE (RESTASIS) 0.05 % ophthalmic emulsion, Place 1 drop into both eyes 2 (two) times daily., Disp: , Rfl:    ezetimibe (ZETIA) 10 MG tablet, TAKE 1 TABLET BY MOUTH EVERY DAY, Disp: 60 tablet, Rfl: 0   fluticasone (FLONASE) 50 MCG/ACT nasal spray, Place 2 sprays into both nostrils daily., Disp: 1 g, Rfl: 0   levothyroxine (SYNTHROID) 100 MCG tablet, TAKE 1 TABLET BY MOUTH EVERY DAY BEFORE BREAKFAST, Disp: 90 tablet, Rfl: 0   magnesium oxide (MAG-OX) 400 MG tablet, Take 400 mg by mouth daily., Disp: , Rfl:    rosuvastatin (CRESTOR) 10 MG tablet, Take 1 tablet (10 mg total) by mouth daily. (Patient taking differently: Take 10 mg by mouth every other day.), Disp: 90 tablet, Rfl: 1   Saccharomyces boulardii (PROBIOTIC) 250 MG CAPS, Take 1 capsule by mouth daily at 12 noon., Disp: , Rfl:    testosterone cypionate (DEPOTESTOSTERONE CYPIONATE) 200 MG/ML injection, INJECT 0.5 ML (100 MG TOTAL) INTO THE MUSCLE EVERY 14 DAYS., Disp: 1 mL, Rfl: 2   vitamin B-12 (CYANOCOBALAMIN) 500 MCG tablet, Take 1 tablet by mouth daily., Disp: , Rfl:   EXAM:  VITALS per patient if applicable:  GENERAL: alert, oriented, appears well and in no acute distress  HEENT: atraumatic, conjunttiva  clear, no obvious abnormalities on inspection of external nose and ears  NECK: normal movements of the head and neck  LUNGS: on inspection no signs of respiratory distress, breathing rate appears normal, no obvious gross SOB, gasping or wheezing  CV: no obvious cyanosis  MS: moves all visible extremities without noticeable abnormality  PSYCH/NEURO: pleasant and cooperative, no obvious depression or anxiety, speech and thought processing grossly intact  ASSESSMENT AND PLAN: There are no diagnoses linked to this encounter.    I discussed the assessment and treatment plan with the patient. The patient was provided an opportunity to ask questions and all were answered.  The patient agreed with the plan and demonstrated an understanding of the instructions.   The patient was advised to call back or seek an in-person evaluation if the symptoms worsen or if the condition fails to improve as anticipated.   I spent 30 minutes dedicated to the care of this patient on the date of this encounter to include pre-visit review of his medical history,  Face-to-face time with the patient , and post visit ordering of testing and therapeutics.    [name], MD

## 2022-03-11 ENCOUNTER — Other Ambulatory Visit: Payer: Self-pay | Admitting: Internal Medicine

## 2022-03-11 DIAGNOSIS — J301 Allergic rhinitis due to pollen: Secondary | ICD-10-CM

## 2022-03-12 NOTE — Assessment & Plan Note (Signed)
He is not in distress,,  supportive care outlined

## 2022-03-13 NOTE — Progress Notes (Signed)
Subjective:    Patient ID: [name], male    DOB: 08-07-1929, 89 y.o.   MRN: [id number] Chief Complaint  Patient presents with   Follow-up    Ultrasound follow up    Wahid Holley is a 89 year old male who presents today for evaluation of his right leg in the setting of a traumatic wound.  He underwent debridement of his right lower extremity on 11/29/2021 by general surgery.  Since that time it has been healing but given the patient's underlying peripheral arterial disease there is concern that he may not be able to heal appropriately.  He had a visit on 01/25/2022 and he has continued to progressively heal since that time.  Today the patient has ABI 0.94 on the right and 0.71 on the left.  He has a TBI of 0.31 on the right and 0.39 on the left.  The previous ABI on the right was 0.82 with a left of 0.62.  An additional arterial duplex noted that the patient has about a 75 to 99% stenosis of the left proximal SFA with some known tibial level occlusive disease.  The patient has primarily monophasic waveforms in the left lower extremity extending from the popliteals down.  Right lower extremity has significant tibial disease as well with monophasic waveforms with normal toe waveforms.     Review of Systems  Skin:  Positive for wound.  All other systems reviewed and are negative.      Objective:   Physical Exam Vitals reviewed.  HENT:     Head: Normocephalic.  Cardiovascular:     Rate and Rhythm: Normal rate.  Pulmonary:     Effort: Pulmonary effort is normal.  Musculoskeletal:     Right lower leg: Edema present.  Skin:    General: Skin is warm and dry.  Neurological:     Mental Status: He is alert and oriented to person, place, and time.  Psychiatric:        Mood and Affect: Mood normal.        Behavior: Behavior normal.        Thought Content: Thought content normal.        Judgment: Judgment normal.     BP 138/68 (BP Location: Right Arm)   Pulse (!) 54   Resp 16    Wt 171 lb 6.4 oz (77.7 kg)   BMI 22.01 kg/m   Past Medical History:  Diagnosis Date   Actinic keratosis    Arthritis    knees   Asymptomatic Sinus bradycardia    a. 05/2016 Zio: Avg HR 61 (41-167).   Benign essential tremor    Cancer ([place]) 2002   melanoma right ear   Colon polyp    COPD (chronic obstructive pulmonary disease) ([place])    pt said he believes it is a misdiagnosis    Coronary artery calcification seen on CT scan    a. 06/2016 CTA Chest: cor Ca2+; b. 05/2017 MV: EF >65%. No ischemia/infarct.   Depression    History of echocardiogram    a. 04/2016 Echo: EF 60-65%, mild conc LVH. Nl PASP.   History of kidney stones    Hx of dysplastic nevus 05/07/2017   L anterior shoulder, severe   Hx of dysplastic nevus 09/06/2020   R lower sternum, moderate atypia   Hypothyroidism    Hypothyroidism    Laceration of right lower extremity    Melanoma ([place]) 1990's per pt   R ear   Melanoma in situ ([place])  01/12/2014   left jaw   MSSA infection, non-invasive    Palpitations    a. 05/2016 Zio: Avg HR 61 (41-167). 9 SVT runs (fastest 167 - 5 beats; longest 8 beats - 101 bpm). Rare PACs/PVCs.   PVC's (premature ventricular contractions)    Rhinitis    Sepsis due to cellulitis ([place]) 11/28/2021   Severe sepsis ([place]) 11/28/2021   Wears dentures    partial upper and lower   Wears hearing aid in both ears     Social History   Socioeconomic History   Marital status: Married    Spouse name: [name]   Number of children: 4   Years of education: 16   Highest education level: Not on file  Occupational History   Occupation: Retired    Comment: Architect -   Tobacco Use   Smoking status: Never   Smokeless tobacco: Never  Vaping Use   Vaping Use: Never used  Substance and Sexual Activity   Alcohol use: Yes    Comment: 1 glass of wine daily   Drug use: No   Sexual activity: Not Currently  Other Topics Concern   Not on file  Social History Narrative   Mr. Gibbon grew up in  [place], [place]. 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 He and his wife are living at [facility]. He is very active at [facility]. He enjoys music.    Social Determinants of Health   Financial Resource Strain: Low Risk  (08/05/2021)   Overall Financial Resource Strain (CARDIA)    Difficulty of Paying Living Expenses: Not hard at all  Food Insecurity: No Food Insecurity (11/28/2021)   Hunger Vital Sign    Worried About Running Out of Food in the Last Year: Never true    Ran Out of Food in the Last Year: Never true  Transportation Needs: No Transportation Needs (11/28/2021)   PRAPARE - Hydrologist (Medical): No    Lack of Transportation (Non-Medical): No  Physical Activity: Insufficiently Active (08/05/2021)   Exercise Vital Sign    Days of Exercise per Week: 4 days    Minutes of Exercise per Session: 30 min  Stress: No Stress Concern Present (08/05/2021)   [place]    Feeling of Stress : Only a little  Social Connections: Socially Integrated (08/05/2021)   Social Connection and Isolation Panel [NHANES]    Frequency of Communication with Friends and Family: More than three times a week    Frequency of Social Gatherings with Friends and Family: Once a week    Attends Religious Services: 1 to 4 times per year    Active Member of [employer] or Organizations: Yes    Attends Music therapist: More than 4 times per year    Marital Status: Married  Human resources officer Violence: Not At Risk (11/28/2021)   Humiliation, Afraid, Rape, and Kick questionnaire    Fear of Current or Ex-Partner: No    Emotionally Abused: No    Physically Abused: No    Sexually Abused: No    Past  Surgical History:  Procedure Laterality Date   ADENOIDECTOMY     CATARACT EXTRACTION W/PHACO Left 10/22/2017   Procedure: CATARACT EXTRACTION PHACO AND INTRAOCULAR LENS PLACEMENT ([place])  LEFT;  Surgeon: Eulogio Bear, MD;  Location: [facility];  Service: Ophthalmology;  Laterality: Left;   CATARACT EXTRACTION W/PHACO Right 11/20/2017   Procedure: CATARACT EXTRACTION PHACO AND INTRAOCULAR LENS PLACEMENT (IOC) RIGHT;  Surgeon: Eulogio Bear, MD;  Location: [facility];  Service: Ophthalmology;  Laterality: Right;   ESOPHAGOGASTRODUODENOSCOPY (EGD) WITH PROPOFOL N/A 10/31/2016   Procedure: ESOPHAGOGASTRODUODENOSCOPY (EGD) WITH PROPOFOL;  Surgeon: Lollie Sails, MD;  Location: [facility] ENDOSCOPY;  Service: Endoscopy;  Laterality: N/A;   I & D EXTREMITY Right 11/29/2021   Procedure: IRRIGATION AND DEBRIDEMENT EXTREMITY;  Surgeon: Olean Ree, MD;  Location: [facility] ORS;  Service: General;  Laterality: Right;   JOINT REPLACEMENT Right 2003   knee   knee meniscus repair Right    MELANOMA EXCISION Right    ear. Followed by Dr. Nehemiah Massed   PATELLECTOMY Bilateral    TONSILLECTOMY      Family History  Problem Relation Age of Onset   Hypertension Mother    Heart disease Mother        CHF   Heart disease Father    Cancer Sister        melanoma    Allergies  Allergen Reactions   Penicillins Rash and Other (See Comments)    Other reaction(s): Other (see comments) Other reaction(s): UNKNOWN   Benzonatate     Pruritic rash   Lexapro [Escitalopram] Other (See Comments)    Increased hand tremor   Molnupiravir     Pruritic rash on neck        Latest Ref Rng & Units 01/04/2022   11:57 AM 12/05/2021    7:26 AM 12/02/2021    4:33 AM  CBC  WBC 4.0 - 10.5 K/uL 6.6  6.2  7.4   Hemoglobin 13.0 - 17.0 g/dL 12.9  11.8  11.5   Hematocrit 39.0 - 52.0 % 38.5  35.6  35.4   Platelets 150.0 - 400.0 K/uL 275.0  349  245       CMP     Component Value Date/Time   NA 137  01/23/2022 1404   K 4.1 01/23/2022 1404   CL 103 01/23/2022 1404   CO2 28 01/23/2022 1404   GLUCOSE 106 (H) 01/23/2022 1404   BUN 28 (H) 01/23/2022 1404   CREATININE 1.19 01/23/2022 1404   CREATININE 0.92 02/03/2015 1504   CALCIUM 8.7 01/23/2022 1404   PROT 6.2 01/04/2022 1157   ALBUMIN 4.0 01/04/2022 1157   AST 19 01/04/2022 1157   ALT 17 01/04/2022 1157   ALKPHOS 56 01/04/2022 1157   BILITOT 0.9 01/04/2022 1157   GFRNONAA >60 12/05/2021 0726   GFRNONAA 77 02/03/2015 1504   GFRAA >60 05/10/2016 0236   GFRAA 89 02/03/2015 1504     VAS [place] ABI WITH/WO TBI  Result Date: 01/30/2022  [place] STUDY Patient Name:  TAYDEN NICHELSON  Date of Exam:   01/25/2022 Medical Rec #: [id number]      Accession #:    [id number] Date of Birth: October 01, 1929      Patient Gender: M Patient Age:   43 years Exam Location:  [facility] Procedure:      VAS [place] ABI WITH/WO TBI Referring Phys: Leotis Pain --------------------------------------------------------------------------------  Indications: Gangrene, and peripheral artery disease.  Performing Technologist: Almira Coaster RVS  Examination Guidelines: A complete evaluation includes at minimum, Doppler waveform signals and systolic blood pressure reading at the level of bilateral brachial, anterior tibial, and posterior tibial arteries, when vessel segments are  accessible. Bilateral testing is considered an integral part of a complete examination. Photoelectric Plethysmograph (PPG) waveforms and toe systolic pressure readings are included as required and additional duplex testing as needed. Limited examinations for reoccurring indications may be performed as noted.  ABI Findings: +---------+------------------+-----+--------+--------+ Right    Rt Pressure (mmHg)IndexWaveformComment  +---------+------------------+-----+--------+--------+ Brachial 146                                      +---------+------------------+-----+--------+--------+ ATA      126               0.84 biphasic         +---------+------------------+-----+--------+--------+ PTA      91                0.61 biphasic         +---------+------------------+-----+--------+--------+ Great Toe145               0.97 Normal           +---------+------------------+-----+--------+--------+ +---------+------------------+-----+--------+-------+ Left     Lt Pressure (mmHg)IndexWaveformComment +---------+------------------+-----+--------+-------+ Brachial 150                                    +---------+------------------+-----+--------+-------+ ATA      93                0.62 biphasic        +---------+------------------+-----+--------+-------+ PTA      93                0.62 biphasic        +---------+------------------+-----+--------+-------+ Great Toe113               0.75 Normal          +---------+------------------+-----+--------+-------+ +-------+-----------+-----------+------------+------------+ ABI/TBIToday's ABIToday's TBIPrevious ABIPrevious TBI +-------+-----------+-----------+------------+------------+ Right  .84        .97        .61         .60          +-------+-----------+-----------+------------+------------+ Left   .62        .75        .49         .47          +-------+-----------+-----------+------------+------------+ Bilateral ABIs appear increased compared to prior study on 09/30/2021. Bilateral TBIs appear increased compared to prior study on 09/30/2021.  Summary: Right: Resting right ankle-brachial index indicates mild right lower extremity arterial disease. The right toe-brachial index is normal. Left: Resting left ankle-brachial index indicates moderate left lower extremity arterial disease. The left toe-brachial index is normal. *See table(s) above for measurements and observations.   Electronically signed by Leotis Pain MD on 01/30/2022 at 1:03:42 PM.     Final        Assessment & Plan:   1. PAD (peripheral artery disease) ([place]) Based on noninvasive studies patient should have marginally adequate ability for wound healing.  The progression he is made is promising.  We will continue to follow with wound care.  The patient has a scheduled follow-up currently in January.  He will maintain that follow-up date. 2. Dyslipidemia Continue statin as ordered and reviewed, no changes at this time   Current Outpatient Medications on File Prior to Visit  Medication Sig Dispense Refill   ascorbic acid (VITAMIN C) 500 MG tablet Take 500 mg  by mouth daily.     buPROPion (WELLBUTRIN SR) 150 MG 12 hr tablet Take 150 mg by mouth daily.     Cholecalciferol (VITAMIN D3) 50 MCG (2000 UT) TABS Take 1 tablet by mouth daily.     cycloSPORINE (RESTASIS) 0.05 % ophthalmic emulsion Place 1 drop into both eyes 2 (two) times daily.     ezetimibe (ZETIA) 10 MG tablet TAKE 1 TABLET BY MOUTH EVERY DAY 60 tablet 0   fluticasone (FLONASE) 50 MCG/ACT nasal spray Place 2 sprays into both nostrils daily. 1 g 0   levothyroxine (SYNTHROID) 100 MCG tablet TAKE 1 TABLET BY MOUTH EVERY DAY BEFORE BREAKFAST 90 tablet 0   magnesium oxide (MAG-OX) 400 MG tablet Take 400 mg by mouth daily.     rosuvastatin (CRESTOR) 10 MG tablet Take 1 tablet (10 mg total) by mouth daily. (Patient taking differently: Take 10 mg by mouth every other day.) 90 tablet 1   Saccharomyces boulardii (PROBIOTIC) 250 MG CAPS Take 1 capsule by mouth daily at 12 noon.     testosterone cypionate (DEPOTESTOSTERONE CYPIONATE) 200 MG/ML injection INJECT 0.5 ML (100 MG TOTAL) INTO THE MUSCLE EVERY 14 DAYS. 1 mL 2   vitamin B-12 (CYANOCOBALAMIN) 500 MCG tablet Take 1 tablet by mouth daily.     No current facility-administered medications on file prior to visit.    There are no Patient Instructions on file for this visit. No follow-ups on file.   Kris Hartmann, NP

## 2022-03-14 ENCOUNTER — Encounter: Payer: PPO | Admitting: Speech Pathology

## 2022-03-14 NOTE — Progress Notes (Deleted)
Cardiology Office Note  Date:  03/14/2022   ID:  Theodore [name], DOB 1930-01-22, MRN [id number]  PCP:  [name], MD   No chief complaint on file.   HPI:  89 yo gentleman who lives at [facility] with notes indicating a history of asymptomatic bradycardia,  prior history of depression,  who presented to the hospital with episodes of shortness of breath, lightheadedness, fatigue 05/10/2016 COPD , dx by pulmonary, dr. Mortimer Fries Covid 03/2020, scarring within the left lung base. Who presents for follow-up of his sinus bradycardia and shortness of breath  Last seen by myself in clinic December 2022  Reported having fatigue, shortness of breath, presyncope after ambulating stairs Episodes lasting several hours  Reports losing 20 pounds  Given amoxicillin by dentist Has an allergy Developed and episode of spinning, weakness, SOB, significant rash Getting better , still with rash on his legs Penicillin on his allergy list  Plays the [place], Chronic shortness of breath   EKG personally reviewed by myself on todays visit Shows sinus bradycardia rate 53 bpm no significant ST-T wave changes  Recent imaging and studies reviewed in detail  Zio monitor, reviewed on today's visit Patch Wear Time:  13 days and 9 hours  Normal sinus rhythm,  min HR of 42 bpm, max HR of 160 bpm, and avg HR of 59 bpm. ----15 Supraventricular Tachycardia runs occurred, the run with the fastest interval lasting 10 beats with a max rate of 160 bpm, the longest lasting 9 beats with an avg rate of 93 bpm. ---- Isolated SVEs were rare (<1.0%), SVE Couplets were rare (<1.0%), and SVE Triplets were rare (<1.0%). Isolated VEs were rare (<1.0%), VE Couplets were rare (<1.0%), and no VE Triplets were present.  Ventricular Bigeminy and Trigeminy were present.  ----Patient triggered events associated with normal sinus rhythm, no significant arrhythmia  Echo February 10, 2021 reviewed today  1. Left ventricular  ejection fraction, by estimation, is 65 to 70%. The  left ventricle has normal function. The left ventricle has no regional  wall motion abnormalities. There is mild left ventricular hypertrophy.  Left ventricular diastolic parameters  are consistent with Grade I diastolic dysfunction (impaired relaxation).   2. Right ventricular systolic function is normal. The right ventricular  size is not well visualized.   3. The mitral valve is grossly normal. No evidence of mitral valve  regurgitation.   4. The aortic valve was not well visualized. Aortic valve regurgitation  is not visualized.   5. The inferior vena cava is normal in size with greater than 50%  respiratory variability, suggesting right atrial pressure of 3 mmHg.   Previously declined cholesterol medication Denies any chest pain concerning for angina, no claudication symptoms  CT scan performed previously with aortic atherosclerosis ,coronary calcification ABI studies :moderate bilateral disease  Other past medical history reviewed Cardiopulmonary stress testing was ordered 2019  gas exchange demonstrates excellent functional capacity no evidence of cardiopulmonary limitations.  At peak exercise, patient approached ventilatory limits and is likely hyperventilating and shown with elevated VE/VCO2 slope.  EKG personally reviewed by myself on todays visit Shows sinus bradycardia rate 53 bpm no significant ST-T wave changes  Other past medical history reviewed Prior history of depression October 2017,  Weight loss with anorexia  CT scan done in the hospital showing no significant PE contribute to shortness of breath  shows three-vessel coronary calcifications at least moderate in severity, aortic arch calcification mild to moderate   PMH:  has a past medical history of Actinic keratosis, Arthritis, Asymptomatic Sinus bradycardia, Benign essential tremor, Cancer ([place]) (2002), Colon polyp, COPD (chronic obstructive pulmonary  disease) ([place]), Coronary artery calcification seen on CT scan, Depression, History of echocardiogram, History of kidney stones, dysplastic nevus (05/07/2017), dysplastic nevus (09/06/2020), Hypothyroidism, Hypothyroidism, Laceration of right lower extremity, Melanoma ([place]) (1990's per pt), Melanoma in situ ([place]) (01/12/2014), MSSA infection, non-invasive, Palpitations, PVC's (premature ventricular contractions), Rhinitis, Sepsis due to cellulitis ([place]) (11/28/2021), Severe sepsis ([place]) (11/28/2021), Wears dentures, and Wears hearing aid in both ears.  PSH:    Past Surgical History:  Procedure Laterality Date   ADENOIDECTOMY     CATARACT EXTRACTION W/PHACO Left 10/22/2017   Procedure: CATARACT EXTRACTION PHACO AND INTRAOCULAR LENS PLACEMENT ([place])  LEFT;  Surgeon: Eulogio Bear, MD;  Location: [facility];  Service: Ophthalmology;  Laterality: Left;   CATARACT EXTRACTION W/PHACO Right 11/20/2017   Procedure: CATARACT EXTRACTION PHACO AND INTRAOCULAR LENS PLACEMENT (IOC) RIGHT;  Surgeon: Eulogio Bear, MD;  Location: [facility];  Service: Ophthalmology;  Laterality: Right;   ESOPHAGOGASTRODUODENOSCOPY (EGD) WITH PROPOFOL N/A 10/31/2016   Procedure: ESOPHAGOGASTRODUODENOSCOPY (EGD) WITH PROPOFOL;  Surgeon: Lollie Sails, MD;  Location: [facility] ENDOSCOPY;  Service: Endoscopy;  Laterality: N/A;   I & D EXTREMITY Right 11/29/2021   Procedure: IRRIGATION AND DEBRIDEMENT EXTREMITY;  Surgeon: Olean Ree, MD;  Location: [facility] ORS;  Service: General;  Laterality: Right;   JOINT REPLACEMENT Right 2003   knee   knee meniscus repair Right    MELANOMA EXCISION Right    ear. Followed by Dr. Nehemiah Massed   PATELLECTOMY Bilateral    TONSILLECTOMY      Current Outpatient Medications  Medication Sig Dispense Refill   albuterol (PROVENTIL) (2.5 MG/3ML) 0.083% nebulizer solution Take 3 mLs (2.5 mg total) by nebulization every 6 (six) hours as needed for wheezing or shortness of breath. 60 mL  0   ascorbic acid (VITAMIN C) 500 MG tablet Take 500 mg by mouth daily.     budesonide (PULMICORT) 0.5 MG/2ML nebulizer solution Take 2 mLs (0.5 mg total) by nebulization in the morning and at bedtime for 7 days. 28 mL 0   buPROPion (WELLBUTRIN SR) 150 MG 12 hr tablet Take 150 mg by mouth daily.     Cholecalciferol (VITAMIN D3) 50 MCG (2000 UT) TABS Take 1 tablet by mouth daily.     cycloSPORINE (RESTASIS) 0.05 % ophthalmic emulsion Place 1 drop into both eyes 2 (two) times daily.     ezetimibe (ZETIA) 10 MG tablet TAKE 1 TABLET BY MOUTH EVERY DAY 60 tablet 0   fluticasone (FLONASE) 50 MCG/ACT nasal spray Place 2 sprays into both nostrils daily. 1 g 0   levothyroxine (SYNTHROID) 100 MCG tablet TAKE 1 TABLET BY MOUTH EVERY DAY BEFORE BREAKFAST 90 tablet 0   magnesium oxide (MAG-OX) 400 MG tablet Take 400 mg by mouth daily.     rosuvastatin (CRESTOR) 10 MG tablet Take 1 tablet (10 mg total) by mouth daily. (Patient taking differently: Take 10 mg by mouth every other day.) 90 tablet 1   Saccharomyces boulardii (PROBIOTIC) 250 MG CAPS Take 1 capsule by mouth daily at 12 noon.     testosterone cypionate (DEPOTESTOSTERONE CYPIONATE) 200 MG/ML injection INJECT 0.5 ML (100 MG TOTAL) INTO THE MUSCLE EVERY 14 DAYS. 1 mL 2   vitamin B-12 (CYANOCOBALAMIN) 500 MCG tablet Take 1 tablet by mouth daily.     No current facility-administered medications for this visit.    Allergies:  Penicillins, Benzonatate, Lexapro [escitalopram], and Molnupiravir   Social History:  The patient  reports that he has never smoked. He has never used smokeless tobacco. He reports current alcohol use. He reports that he does not use drugs.   Family History:   family history includes Cancer in his sister; Heart disease in his father and mother; Hypertension in his mother.    Review of Systems: Review of Systems  Constitutional: Negative.   Respiratory: Negative.    Cardiovascular: Negative.   Gastrointestinal: Negative.    Musculoskeletal: Negative.   Neurological: Negative.   Psychiatric/Behavioral: Negative.    All other systems reviewed and are negative.   PHYSICAL EXAM: There were no vitals filed for this visit.  There is no height or weight on file to calculate BMI.  There were no vitals filed for this visit.  Constitutional:  oriented to person, place, and time. No distress.  HENT:  Head: Grossly normal Eyes:  no discharge. No scleral icterus.  Neck: No JVD, no carotid bruits  Cardiovascular: Regular rate and rhythm, no murmurs appreciated Pulmonary/Chest: Clear to auscultation bilaterally, no wheezes or rails Abdominal: Soft.  no distension.  no tenderness.  Musculoskeletal: Normal range of motion Neurological:  normal muscle tone. Coordination normal. No atrophy Skin: Skin warm and dry Psychiatric: normal affect, pleasant   Recent Labs: 08/26/2021: TSH 0.97 12/05/2021: Magnesium 2.1 01/04/2022: ALT 17; Hemoglobin 12.9; Platelets 275.0 01/23/2022: BUN 28; Creatinine, Ser 1.19; Potassium 4.1; Sodium 137    Lipid Panel Lab Results  Component Value Date   CHOL 197 09/21/2020   HDL 62.00 09/21/2020   LDLCALC 121 (H) 09/21/2020   TRIG 70.0 09/21/2020      Wt Readings from Last 3 Encounters:  03/10/22 168 lb (76.2 kg)  03/01/22 171 lb 6.4 oz (77.7 kg)  02/10/22 177 lb (80.3 kg)       ASSESSMENT AND PLAN:  Bradycardia - Plan: EKG 12-Lead Asymptomatic bradycardia, rate in the 50s Unchanged, we will not use beta-blockers for any of his other arrhythmia as he is not very symptomatic  Depression with anxiety Managed by primary care In good spirits today  Aortic atherosclerosis ([place]) Zetia refilled Does not want a statin Discussed significant aortic atherosclerosis on CT scan  COPD Managed by pulmonary, on inhalers Chronic shortness of breath,  Regular walking program  Coronary artery disease involving native coronary artery of native heart with angina pectoris  (HCC) Chronic shortness of breath, discussed doing a stress test, he will think about it and let us know Other studies including echocardiogram, no further work-up needed We recommended regular walking program  PAD Mild to moderate aortic atherosclerosis seen on prior imaging He does not want a statin On Zetia  Essential tremor Prior successful procedure Stable      Total encounter time more than 25 minutes  Greater than 50% was spent in counseling and coordination of care with the patient    No orders of the defined types were placed in this encounter.    Signed, Esmond Plants, M.D., Ph.D. 03/14/2022  [place], [place]

## 2022-03-15 ENCOUNTER — Ambulatory Visit: Payer: PPO | Admitting: Cardiovascular Disease

## 2022-03-15 ENCOUNTER — Ambulatory Visit: Payer: PPO | Admitting: Speech Pathology

## 2022-03-16 ENCOUNTER — Ambulatory Visit: Payer: PPO | Admitting: Physician Assistant

## 2022-03-16 ENCOUNTER — Encounter: Payer: PPO | Admitting: Speech Pathology

## 2022-03-17 ENCOUNTER — Ambulatory Visit: Payer: PPO | Admitting: Speech Pathology

## 2022-03-20 ENCOUNTER — Ambulatory Visit: Payer: PPO | Attending: Internal Medicine | Admitting: Speech Pathology

## 2022-03-20 ENCOUNTER — Encounter: Payer: PPO | Admitting: Speech Pathology

## 2022-03-20 DIAGNOSIS — F0283 Dementia in other diseases classified elsewhere, unspecified severity, with mood disturbance: Secondary | ICD-10-CM | POA: Insufficient documentation

## 2022-03-20 DIAGNOSIS — R41841 Cognitive communication deficit: Secondary | ICD-10-CM | POA: Insufficient documentation

## 2022-03-20 DIAGNOSIS — G309 Alzheimer's disease, unspecified: Secondary | ICD-10-CM | POA: Diagnosis not present

## 2022-03-21 NOTE — Therapy (Signed)
OUTPATIENT SPEECH LANGUAGE PATHOLOGY TREATMENT NOTE   Patient Name: Theodore [name] MRN: [id number] DOB:1930-02-25, 88 y.o., male Today's Date: 03/20/2022  PCP: [name], MD REFERRING PROVIDER: [name], MD  END OF SESSION:   End of Session - 03/20/22 0949     Visit Number 4    Number of Visits 25    Date for SLP Re-Evaluation 05/17/22    Authorization Type [employer] P[employer]    Progress Note Due on Visit 10    SLP Start Time 1100    SLP Stop Time  1200    SLP Time Calculation (min) 60 min    Activity Tolerance Patient tolerated treatment well                 Past Medical History:  Diagnosis Date   Actinic keratosis    Arthritis    knees   Asymptomatic Sinus bradycardia    a. 05/2016 Zio: Avg HR 61 (41-167).   Benign essential tremor    Cancer ([place]) 2002   melanoma right ear   Colon polyp    COPD (chronic obstructive pulmonary disease) ([place])    pt said he believes it is a misdiagnosis    Coronary artery calcification seen on CT scan    a. 06/2016 CTA Chest: cor Ca2+; b. 05/2017 MV: EF >65%. No ischemia/infarct.   Depression    History of echocardiogram    a. 04/2016 Echo: EF 60-65%, mild conc LVH. Nl PASP.   History of kidney stones    Hx of dysplastic nevus 05/07/2017   L anterior shoulder, severe   Hx of dysplastic nevus 09/06/2020   R lower sternum, moderate atypia   Hypothyroidism    Hypothyroidism    Laceration of right lower extremity    Melanoma ([place]) 1990's per pt   R ear   Melanoma in situ ([place]) 01/12/2014   left jaw   MSSA infection, non-invasive    Palpitations    a. 05/2016 Zio: Avg HR 61 (41-167). 9 SVT runs (fastest 167 - 5 beats; longest 8 beats - 101 bpm). Rare PACs/PVCs.   PVC's (premature ventricular contractions)    Rhinitis    Sepsis due to cellulitis ([place]) 11/28/2021   Severe sepsis ([place]) 11/28/2021   Wears dentures    partial upper and lower   Wears hearing aid in both ears    Past Surgical History:  Procedure  Laterality Date   ADENOIDECTOMY     CATARACT EXTRACTION W/PHACO Left 10/22/2017   Procedure: CATARACT EXTRACTION PHACO AND INTRAOCULAR LENS PLACEMENT ([place])  LEFT;  Surgeon: Eulogio Bear, MD;  Location: [facility];  Service: Ophthalmology;  Laterality: Left;   CATARACT EXTRACTION W/PHACO Right 11/20/2017   Procedure: CATARACT EXTRACTION PHACO AND INTRAOCULAR LENS PLACEMENT (IOC) RIGHT;  Surgeon: Eulogio Bear, MD;  Location: [facility];  Service: Ophthalmology;  Laterality: Right;   ESOPHAGOGASTRODUODENOSCOPY (EGD) WITH PROPOFOL N/A 10/31/2016   Procedure: ESOPHAGOGASTRODUODENOSCOPY (EGD) WITH PROPOFOL;  Surgeon: Lollie Sails, MD;  [facility] ENDOSCOPY;  Service: Endoscopy;  Laterality: N/A;   I & D EXTREMITY Right 11/29/2021   Procedure: IRRIGATION AND DEBRIDEMENT EXTREMITY;  Surgeon: Olean Ree, MD;  Location: [facility] ORS;  Service: General;  Laterality: Right;   JOINT REPLACEMENT Right 2003   knee   knee meniscus repair Right    MELANOMA EXCISION Right    ear. Followed by Dr. Nehemiah Massed   PATELLECTOMY Bilateral    TONSILLECTOMY     Patient Active Problem List  Diagnosis Date Noted   Sialorrhea 02/02/2022   Protein-calorie malnutrition, moderate ([place]) 11/29/2021   Dementia in Alzheimer's disease with depression ([place]) 11/28/2021   Dyslipidemia 11/28/2021   GERD without esophagitis 11/28/2021   Wound infection, posttraumatic 09/22/2021   Proximal muscle weakness 08/27/2021   Speech disturbance 08/26/2021   Adverse drug reaction, initial encounter 05/04/2021   Lumbar radiculopathy 04/25/2021   Thoracic aortic atherosclerosis ([place]) 01/17/2021   Dizziness 12/31/2020   Anxiety in acute stress reaction 12/07/2020   Mild cognitive impairment with memory loss 11/30/2020   Elevated PSA, between 10 and less than 20 ng/ml 11/30/2020   Chronic kidney disease, stage 3b ([place]) 11/10/2020   Benign hypertensive kidney disease with chronic kidney disease 11/10/2020    COVID-19 virus infection 04/06/2020   Acquired trigger finger of left middle finger 08/07/2019   Carpal tunnel syndrome of right wrist 07/10/2019   Ulnar neuropathy 06/05/2019   Disorder of bursae of shoulder region 01/29/2019   Localized, primary osteoarthritis of shoulder region 01/29/2019   Localized, secondary osteoarthritis of the shoulder region 01/29/2019   Osteoarthritis of knee 01/29/2019   Shoulder joint pain 01/29/2019   Chronic obstructive pulmonary disease ([place]) 09/24/2018   Grief 09/02/2018   Cervical radiculopathy 08/14/2018   PVC (premature ventricular contraction) 03/21/2018   PAD (peripheral artery disease) ([place])    Anterior knee pain, left 12/26/2015   Hypothyroidism 07/04/2014   Nonallergic vasomotor rhinitis 07/01/2014   Hypertrophy of prostate with urinary obstruction and other lower urinary tract symptoms (LUTS) 08/08/2013   Low serum testosterone level 04/28/2013   Actinic keratosis 05/10/2010   History of malignant melanoma of skin 05/10/2010    ONSET DATE: date of referrals 02/01/2022 and 02/22/2022    REFERRING DIAG: R47.1 (ICD-10-CM) - Dysarthria R41.89 (ICD-10-CM) - Cognitive decline     PERTINENT HISTORY: Pt with recent course of Outpatient ST services same deficits (08/26/2021 and 09/07/2021).  [name]      07/21/2019 - pt underwent MRI guided focused ultrasound thalamotomy LEFT sided for treatment of essential tremor of RIGHT arm    07/30/2021 - pt underwent MRI guided focused ultrasound thalamotomy RIGHT sided for treatment of essential tremor of LEFT arm        DIAGNOSTIC FINDINGS:    MRi 07/05/2021 consistent with recent MR guided focus ultrasound ablation in the right subthalamic region A focus of diffusion restriction right subthalamic region measures 9 x 6 mm     MBSS 2017    MBSS 11/04/2021 Pt presents with overall adequate oropharyngeal abilities when consuming puree, soft solids, thin liquids via spoon and cup as well as whole  barium tablet with thin liquids. Pt's swallow function doesn't appear any different than documented in his previous MBSS (2017) which stated, "Thin liquid- small amount of laryngeal penetration with rapid successive swallowing maneuvers. With single swallowing maneuvers no laryngeal penetration is observed. As well in this previous study, pt continues to demonstrate penetration with trace aspiration when consuming successive swallows. Pt demonstrated understanding need to take single sips as he stated that he doesn't consume consecutive sips at baseline. At this time, pt presents with adequate oropharyngeal abilities and reduced risk of aspiration when consuming regular diet with thin liquids via single cup sips and medicine whole with thin liquids. Skilled ST intervention is not indicated.   THERAPY DIAG:  Cognitive communication deficit  Dementia in Alzheimer's disease with depression ([place])  Rationale for Evaluation and Treatment Rehabilitation  SUBJECTIVE: pt pleasant, several instances of hearing inaccuracy  Pt accompanied by: significant  other  PAIN:  Are you having pain? No  PATIENT GOALS: to get better  OBJECTIVE:   TODAY'S TREATMENT:  Skilled treatment session focused on pt's cognitive communication goals. SLP facilitated session by providing the following interventions:    SLP received a copy of pt's audiogram from ENT. SLP reviewed pt's hearing loss, specifically his scores on WRS. Left ear 69% at 80 dB and Right 24% at 80 dB. SLP provided verbal and written information on strategies to increase pt's ability to understand conversation - look at listener in the eye, decrease any distractions, write things down when speaking to pt, provided model of how to accomplish this in a social setting.    Pt appeared to have some confusion over his hearing aid settings, recommend having them re-calibrated.    Pt continues to lose weight and appears very malnourished. He states that his lower  dentures are in process of being made and reports mostly eating tapioca pudding. SLP provided a list of soft foods that contain more nutrients than pudding. Written list provided to pt and his wife.    PATIENT EDUCATION: Education details: see above Person educated: Patient and Spouse Education method: Explanation, Media planner, Verbal cues, and Handouts Education comprehension: needs further education  HOME EXERCISE PROGRAM:  Use list of food items to facilitate oropharyngeal abilities and to increase nutrition.   GOALS: Goals reviewed with patient? Yes  SHORT TERM GOALS: Target date: 10 sessions  Patient will demonstrate understanding of functional cognitive activities for home maintenance program with moderate assistance Baseline: Maximal Goal status: INITIAL   2.  Given moderate assistance, patient will demonstrate knowledge of appropriate activities to support cognitive and  language function outside of ST with assistance from family.  Baseline: Maximal Goal status: INITIAL   3.  Patient will report engagement in cognitive activities outside of ST 4/7 days.  Baseline:  Goal status: INITIAL   4.  Pt will verbalize and demonstrate how to implement 1 memory and attention strategies to aid daily functioning with moderate A over 2 sessions  Baseline: currently not using any strategies Goal status: INITIAL     LONG TERM GOALS: Target date: 05/17/2022 Patient will demonstrate understanding of functional cognitive activities for home maintenance program with minimal assistance Baseline: maximal Goal status: INITIAL  ASSESSMENT:  CLINICAL IMPRESSION: Pt appeared very subdued and voiced being overwhelmed. He and his wife recently had COVID and while they didn't have any symptoms, they needed to quarantine. This greatly changed pt's sense of normalcy. Education provided to pt and his wife that any change in routine will be very fatiguing for patients with cognitive deficits such  as Carloyn Manner. This seemed to provide some comfort. Pt continues to report that he hates the way his voice sounds, especially over the phone and that listeners can't understand him. Will target in future session.    OBJECTIVE IMPAIRMENTS include attention, memory, awareness, executive functioning, dysarthria, dysphagia, and motor speech . These impairments are limiting patient from managing medications, managing appointments, managing finances, household responsibilities, ADLs/IADLs, effectively communicating at home and in community, and safety when swallowing. Factors affecting potential to achieve goals and functional outcome are ability to learn/carryover information, co-morbidities, cooperation/participation level, medical prognosis, previous level of function, and severity of impairments. Patient will benefit from skilled SLP services to address above impairments and improve overall function.  REHAB POTENTIAL: Fair bilateral thalamotomy  PLAN: SLP FREQUENCY: 1-2x/week  SLP DURATION: 8 weeks  PLANNED INTERVENTIONS: Aspiration precaution training, Environmental controls, Cueing hierachy, Cognitive reorganization,  Internal/external aids, Functional tasks, SLP instruction and feedback, Compensatory strategies, and Patient/family education  Bianco Cange B. Rutherford Nail, M.S., CCC-SLP, Mining engineer Certified Brain Injury [place]  [place] Office [phone] Ascom [phone] Fax [phone]

## 2022-03-22 ENCOUNTER — Ambulatory Visit: Payer: PPO | Admitting: Speech Pathology

## 2022-03-22 ENCOUNTER — Telehealth: Payer: Self-pay | Admitting: Internal Medicine

## 2022-03-22 ENCOUNTER — Encounter: Payer: Self-pay | Admitting: Internal Medicine

## 2022-03-22 DIAGNOSIS — R29898 Other symptoms and signs involving the musculoskeletal system: Secondary | ICD-10-CM

## 2022-03-22 MED ORDER — BUPROPION HCL ER (SR) 150 MG PO TB12: 150.0000 mg | ORAL_TABLET | Freq: Every day | ORAL | 3 refills | Status: DC

## 2022-03-22 NOTE — Telephone Encounter (Signed)
Prescription Request  03/22/2022  Is this a "Controlled Substance" medicine? No  LOV: 02/01/2022  What is the name of the medication or equipment? buPROPion (WELLBUTRIN SR) 150 MG 12 hr tablet  Have you contacted your pharmacy to request a refill? Yes   Which pharmacy would you like this sent to?   [id number] [address]Phone: [phone]: [phone]   Patient notified that their request is being sent to the clinical staff for review and that they should receive a response within 2 business days.   Please advise at Mobile [phone](mobile)

## 2022-03-22 NOTE — Telephone Encounter (Signed)
Medication has been refilled.

## 2022-03-23 ENCOUNTER — Encounter: Payer: PPO | Attending: Physician Assistant | Admitting: Physician Assistant

## 2022-03-23 ENCOUNTER — Encounter (INDEPENDENT_AMBULATORY_CARE_PROVIDER_SITE_OTHER): Payer: PPO

## 2022-03-23 ENCOUNTER — Encounter: Payer: PPO | Admitting: Speech Pathology

## 2022-03-23 ENCOUNTER — Ambulatory Visit (INDEPENDENT_AMBULATORY_CARE_PROVIDER_SITE_OTHER): Payer: PPO | Admitting: Vascular Surgery

## 2022-03-23 DIAGNOSIS — N1831 Chronic kidney disease, stage 3a: Secondary | ICD-10-CM | POA: Diagnosis not present

## 2022-03-23 DIAGNOSIS — L97812 Non-pressure chronic ulcer of other part of right lower leg with fat layer exposed: Secondary | ICD-10-CM | POA: Insufficient documentation

## 2022-03-23 DIAGNOSIS — L97816 Non-pressure chronic ulcer of other part of right lower leg with bone involvement without evidence of necrosis: Secondary | ICD-10-CM | POA: Diagnosis not present

## 2022-03-23 DIAGNOSIS — X58XXXA Exposure to other specified factors, initial encounter: Secondary | ICD-10-CM | POA: Insufficient documentation

## 2022-03-23 DIAGNOSIS — S0001XA Abrasion of scalp, initial encounter: Secondary | ICD-10-CM | POA: Insufficient documentation

## 2022-03-23 DIAGNOSIS — S0180XA Unspecified open wound of other part of head, initial encounter: Secondary | ICD-10-CM | POA: Diagnosis not present

## 2022-03-23 DIAGNOSIS — I70238 Atherosclerosis of native arteries of right leg with ulceration of other part of lower right leg: Secondary | ICD-10-CM | POA: Insufficient documentation

## 2022-03-23 DIAGNOSIS — J449 Chronic obstructive pulmonary disease, unspecified: Secondary | ICD-10-CM | POA: Insufficient documentation

## 2022-03-23 NOTE — Progress Notes (Addendum)
IAM, LIPSON A ([id number]) [id number] Page 1 of 9 Visit Report for 03/23/2022 Arrival Information Details Patient Name: Date of Service: Theodore [name], Theodore [name] 03/23/2022 9:00 A M Medical Record Number: [id number] Patient Account Number: [id number] Date of Birth/Sex: Treating RN: 1929/05/06 (89 y.o. [name] Primary Care Breven Guidroz: [name] Other Clinician: Massie Kluver Referring Ferol Laiche: Treating Itzamar Traynor/Extender: [name] in Treatment: 9 Visit Information History Since Last Visit All ordered tests and consults were completed: No Patient Arrived: Gilford Rile Added or deleted any medications: No Arrival Time: 09:13 Any new allergies or adverse reactions: No Transfer Assistance: None Had a fall or experienced change in No Patient Identification Verified: Yes activities of daily living that may affect Secondary Verification Process Completed: Yes risk of falls: Patient Requires Transmission-Based Precautions: No Signs or symptoms of abuse/neglect since last visito No Patient Has Alerts: Yes Hospitalized since last visit: No Patient Alerts: ABI R .61 09/30/21 TBI .38 Implantable device outside of the clinic excluding No ABI L .49 09/30/21 TBI .47 cellular tissue based products placed in the center since last visit: Has Dressing in Place as Prescribed: Yes Pain Present Now: No Electronic Signature(s) Signed: 03/24/2022 1:23:45 PM By: Massie Kluver Entered By: Massie Kluver on 03/23/2022 09:14:35 -------------------------------------------------------------------------------- Clinic Level of Care Assessment Details Patient Name: Date of Service: Theodore [name], Theodore [name] 03/23/2022 9:00 A M Medical Record Number: [id number] Patient Account Number: [id number] Date of Birth/Sex: Treating RN: [birth year] (89 y.o. [name] Primary Care Ewen Varnell: [name] Other Clinician: Massie Kluver Referring Tyeasha Ebbs: Treating Abygail Galeno/Extender:  [name] in Treatment: 9 Clinic Level of Care Assessment Items TOOL 4 Quantity Score '[]'$  - 0 Use when only an EandM is performed on FOLLOW-UP visit ASSESSMENTS - Nursing Assessment / Reassessment X- 1 10 Reassessment of Co-morbidities (includes updates in patient status) X- 1 5 Reassessment of Adherence to Treatment Plan RAMEY, SCHIFF A ([id number]) [id number] Page 2 of 9 ASSESSMENTS - Wound and Skin A ssessment / Reassessment '[]'$  - 0 Simple Wound Assessment / Reassessment - one wound X- 2 5 Complex Wound Assessment / Reassessment - multiple wounds '[]'$  - 0 Dermatologic / Skin Assessment (not related to wound area) ASSESSMENTS - Focused Assessment '[]'$  - 0 Circumferential Edema Measurements - multi extremities '[]'$  - 0 Nutritional Assessment / Counseling / Intervention '[]'$  - 0 Lower Extremity Assessment (monofilament, tuning fork, pulses) '[]'$  - 0 Peripheral Arterial Disease Assessment (using hand held doppler) ASSESSMENTS - Ostomy and/or Continence Assessment and Care '[]'$  - 0 Incontinence Assessment and Management '[]'$  - 0 Ostomy Care Assessment and Management (repouching, etc.) PROCESS - Coordination of Care X - Simple Patient / Family Education for ongoing care 1 15 '[]'$  - 0 Complex (extensive) Patient / Family Education for ongoing care '[]'$  - 0 Staff obtains Programmer, systems, Records, T Results / Process Orders est '[]'$  - 0 Staff telephones HHA, Nursing Homes / Clarify orders / etc '[]'$  - 0 Routine Transfer to another Facility (non-emergent condition) '[]'$  - 0 Routine Hospital Admission (non-emergent condition) '[]'$  - 0 New Admissions / Biomedical engineer / Ordering NPWT Apligraf, etc. , '[]'$  - 0 Emergency Hospital Admission (emergent condition) X- 1 10 Simple Discharge Coordination '[]'$  - 0 Complex (extensive) Discharge Coordination PROCESS - Special Needs '[]'$  - 0 Pediatric / Minor Patient Management '[]'$  - 0 Isolation Patient  Management '[]'$  - 0 Hearing / Language / Visual special needs '[]'$  - 0 Assessment of Community assistance (transportation, D/C planning, etc.) '[]'$  - 0 Additional assistance / Altered  mentation '[]'$  - 0 Support Surface(s) Assessment (bed, cushion, seat, etc.) INTERVENTIONS - Wound Cleansing / Measurement '[]'$  - 0 Simple Wound Cleansing - one wound X- 2 5 Complex Wound Cleansing - multiple wounds X- 1 5 Wound Imaging (photographs - any number of wounds) '[]'$  - 0 Wound Tracing (instead of photographs) '[]'$  - 0 Simple Wound Measurement - one wound X- 2 5 Complex Wound Measurement - multiple wounds INTERVENTIONS - Wound Dressings '[]'$  - 0 Small Wound Dressing one or multiple wounds X- 2 15 Medium Wound Dressing one or multiple wounds '[]'$  - 0 Large Wound Dressing one or multiple wounds '[]'$  - 0 Application of Medications - topical '[]'$  - 0 Application of Medications - injection INTERVENTIONS - Miscellaneous '[]'$  - 0 External ear exam SWANSON, FARNELL A ([id number]) [id number] Page 3 of 9 '[]'$  - 0 Specimen Collection (cultures, biopsies, blood, body fluids, etc.) '[]'$  - 0 Specimen(s) / Culture(s) sent or taken to Lab for analysis '[]'$  - 0 Patient Transfer (multiple staff / Harrel Lemon Lift / Similar devices) '[]'$  - 0 Simple Staple / Suture removal (25 or less) '[]'$  - 0 Complex Staple / Suture removal (26 or more) '[]'$  - 0 Hypo / Hyperglycemic Management (close monitor of Blood Glucose) '[]'$  - 0 Ankle / Brachial Index (ABI) - do not check if billed separately X- 1 5 Vital Signs Has the patient been seen at the hospital within the last three years: Yes Total Score: 110 Level Of Care: New/Established - Level 3 Electronic Signature(s) Signed: 03/24/2022 1:23:45 PM By: Massie Kluver Entered By: Massie Kluver on 03/23/2022 09:46:01 -------------------------------------------------------------------------------- Encounter Discharge Information Details Patient Name: Date of  Service: Theodore [name], Theodore Y A. 03/23/2022 9:00 [place] Record Number: [id number] Patient Account Number: [id number] Date of Birth/Sex: Treating RN: 1929/06/20 (89 y.o. Isac Sarna, Maudie Mercury Primary Care Zeena Starkel: [name] Other Clinician: Massie Kluver Referring Rosea Dory: Treating Leeanne Butters/Extender: [name] in Treatment: 9 Encounter Discharge Information Items Discharge Condition: Stable Ambulatory Status: Walker Discharge Destination: Home Transportation: Private Auto Accompanied By: wife Schedule Follow-up Appointment: Yes Clinical Summary of Care: Electronic Signature(s) Signed: 03/24/2022 1:23:45 PM By: Massie Kluver Entered By: Massie Kluver on 03/23/2022 10:04:28 Lower Extremity Assessment Details -------------------------------------------------------------------------------- [name] ([id number]) [id number] Page 4 of 9 Patient Name: Date of Service: Theodore [name], Theodore [name] 03/23/2022 9:00 A M Medical Record Number: [id number] Patient Account Number: [id number] Date of Birth/Sex: Treating RN: 1929/09/25 (89 y.o. [name] Primary Care Vester Titsworth: [name] Other Clinician: Massie Kluver Referring Firas Guardado: Treating Leatha Rohner/Extender: [name] Weeks in Treatment: 9 Vascular Assessment Left: Right: Pulses: Dorsalis Pedis Palpable: Yes Electronic Signature(s) Signed: 03/23/2022 3:17:21 PM By: Gretta Cool, BSN, RN, CWS, Kim RN, BSN Signed: 03/24/2022 1:23:45 PM By: Massie Kluver Entered By: Massie Kluver on 03/23/2022 09:25:13 -------------------------------------------------------------------------------- Multi Wound Chart Details Patient Name: Date of Service: Sherley, Theodore Y A. 03/23/2022 9:00 A M Medical Record Number: [id number] Patient Account Number: [id number] Date of Birth/Sex: Treating RN: [birth year] (89 y.o. [name] Primary Care Nicandro Perrault: [name] Other Clinician: Massie Kluver Referring Chares Slaymaker: Treating Rinoa Garramone/Extender: [name] Weeks in Treatment: 9 Vital Signs Height(in): 75 Pulse(bpm): 1 Weight(lbs): 168 Blood Pressure(mmHg): 137/81 Body Mass Index(BMI): 21 Temperature(F): 97.5 Respiratory Rate(breaths/min): 16 [2:Photos:] [N/A:N/A] Right, Midline, Anterior Lower Leg N/A N/A Wound Location: Laceration N/A N/A Wounding Event: Skin Tear N/A N/A Primary Etiology: Trauma, Other N/A N/A Secondary Etiology: Chronic Obstructive Pulmonary N/A N/A Comorbid History: Disease (COPD), Peripheral Arterial Disease 11/24/2021 N/A N/A Date  Acquired: 9 N/A N/A Weeks of Treatment: Open N/A N/A Wound Status: No N/A N/A Wound Recurrence: 1.4x0.6x0.1 N/A N/A Measurements L x W x D (cm) 0.66 N/A N/A A (cm) : rea 0.066 N/A N/A Volume (cm) : 93.30% N/A N/A % Reduction in Area: 99.00% N/A N/A % Reduction in Volume: Full Thickness With Exposed Support N/A N/A Classification: BLAISE, GRIESHABER A ([id number]) [id number] Page 5 of 9 Structures Medium N/A N/A Exudate Amount: Serosanguineous N/A N/A Exudate Type: red, brown N/A N/A Exudate Color: Small (1-33%) N/A N/A Granulation Amount: Red, Hyper-granulation N/A N/A Granulation Quality: Small (1-33%) N/A N/A Necrotic Amount: Fat Layer (Subcutaneous Tissue): Yes N/A N/A Exposed Structures: Bone: Yes Fascia: No Tendon: No Muscle: No Joint: No Small (1-33%) N/A N/A Epithelialization: Treatment Notes Electronic Signature(s) Signed: 03/24/2022 1:23:45 PM By: Massie Kluver Entered By: Massie Kluver on 03/23/2022 09:25:18 -------------------------------------------------------------------------------- Pain Assessment Details Patient Name: Date of Service: LUCIOUS, ZOU A. 03/23/2022 9:00 A M Medical Record Number: [id number] Patient Account Number: [id number] Date of Birth/Sex: Treating RN: [birth year] (89 y.o. [name] Primary Care  Adian Jablonowski: [name] Other Clinician: Massie Kluver Referring Ellisha Bankson: Treating Yolani Vo/Extender: [name] Weeks in Treatment: 9 Active Problems Location of Pain Severity and Description of Pain Patient Has Paino No Site Locations Pain Management and Medication Current Pain Management: Electronic Signature(s) Signed: 03/23/2022 3:17:21 PM By: Gretta Cool, BSN, RN, CWS, Kim RN, BSN Signed: 03/24/2022 1:23:45 PM By: Massie Kluver Entered By: Massie Kluver on 03/23/2022 [place], [place] A ([id number]) [id number] Page 6 of 9 -------------------------------------------------------------------------------- Patient/Caregiver Education Details Patient Name: Date of Service: LEROI, HAQUE 1/11/2024andnbsp9:00 [place] Record Number: [id number] Patient Account Number: [id number] Date of Birth/Gender: Treating RN: 12-23-1929 (89 y.o. [name] Primary Care Physician: [name] Other Clinician: Massie Kluver Referring Physician: Treating Physician/Extender: [name] in Treatment: 9 Education Assessment Education Provided To: Patient Education Topics Provided Wound/Skin Impairment: Handouts: Other: continue wound care as directed Methods: Explain/Verbal Responses: State content correctly Electronic Signature(s) Signed: 03/24/2022 1:23:45 PM By: Massie Kluver Entered By: Massie Kluver on 03/23/2022 10:03:51 -------------------------------------------------------------------------------- Wound Assessment Details Patient Name: Date of Service: Theodore [name], Theodore [name] A. 03/23/2022 9:00 A M Medical Record Number: [id number] Patient Account Number: [id number] Date of Birth/Sex: Treating RN: 21-Apr-1929 (89 y.o. [name] Primary Care Colson Barco: [name] Other Clinician: Massie Kluver Referring Roda Lauture: Treating Desteni Piscopo/Extender: [name] Weeks in Treatment: 9 Wound Status Wound  Number: 2 Primary Skin Tear Etiology: Wound Location: Right, Midline, Anterior Lower Leg Secondary Trauma, Other Wounding Event: Laceration Etiology: Date Acquired: 11/24/2021 Wound Status: Open Weeks Of Treatment: 9 Comorbid Chronic Obstructive Pulmonary Disease (COPD), Peripheral Clustered Wound: No History: Arterial Disease Photos CASIMIR, BARCELLOS A ([id number]) [id number] Page 7 of 9 Wound Measurements Length: (cm) 1.4 Width: (cm) 0.6 Depth: (cm) 0.1 Area: (cm) 0.66 Volume: (cm) 0.066 % Reduction in Area: 93.3% % Reduction in Volume: 99% Epithelialization: Small (1-33%) Wound Description Classification: Full Thickness With Exposed Suppo Exudate Amount: Medium Exudate Type: Serosanguineous Exudate Color: red, brown rt Structures Foul Odor After Cleansing: No Slough/Fibrino Yes Wound Bed Granulation Amount: Small (1-33%) Exposed Structure Granulation Quality: Red, Hyper-granulation Fascia Exposed: No Necrotic Amount: Small (1-33%) Fat Layer (Subcutaneous Tissue) Exposed: Yes Necrotic Quality: Adherent Slough, Bone Tendon Exposed: No Muscle Exposed: No Joint Exposed: No Bone Exposed: Yes Treatment Notes Wound #2 (Lower Leg) Wound Laterality: Right, Midline, Anterior Cleanser Soap and Water Discharge Instruction: Gently cleanse wound with antibacterial soap, rinse and  pat dry prior to dressing wounds Peri-Wound Care Topical Primary Dressing Xeroform-HBD 2x2 (in/in) Discharge Instruction: Apply Xeroform-HBD 2x2 (in/in) as directed Secondary Dressing Secured With Kerlix Roll Sterile or Non-Sterile 6-ply 4.5x4 (yd/yd) Discharge Instruction: Apply Kerlix as directed Tubigrip Size D, 3x10 (in/yd) Discharge Instruction: single layer Compression Wrap Compression Stockings Add-Ons Electronic Signature(s) Signed: 03/23/2022 3:17:21 PM By: Gretta Cool, BSN, RN, CWS, Kim RN, BSN Signed: 03/24/2022 1:23:45 PM By: Massie Kluver Entered By: Massie Kluver on 03/23/2022 09:24:52 Theodore [name], Theodore A ([id number]) [id number] Page 8 of 9 -------------------------------------------------------------------------------- Wound Assessment Details Patient Name: Date of Service: Theodore [name], Theodore [name] 03/23/2022 9:00 A M Medical Record Number: [id number] Patient Account Number: [id number] Date of Birth/Sex: Treating RN: 1929/10/09 (89 y.o. Isac Sarna, Maudie Mercury Primary Care Glenyce Randle: [name] Other Clinician: Massie Kluver Referring Adama Ivins: Treating Sammie Denner/Extender: [name] Weeks in Treatment: 9 Wound Status Wound Number: 3 Primary Abrasion Etiology: Wound Location: Forehead Wound Status: Open Wounding Event: Trauma Comorbid Chronic Obstructive Pulmonary Disease (COPD), Peripheral Date Acquired: 03/22/2022 History: Arterial Disease Weeks Of Treatment: 0 Clustered Wound: No Photos Wound Measurements Length: (cm) 2.8 Width: (cm) 1 Depth: (cm) 0.1 Area: (cm) 2.199 Volume: (cm) 0.22 % Reduction in Area: % Reduction in Volume: Epithelialization: Small (1-33%) Tunneling: No Undermining: No Wound Description Classification: Partial Thickness Exudate Amount: Small Exudate Type: Serosanguineous Exudate Color: red, brown Foul Odor After Cleansing: No Slough/Fibrino No Wound Bed Exposed Structure Fascia Exposed: No Fat Layer (Subcutaneous Tissue) Exposed: No Tendon Exposed: No Muscle Exposed: No Joint Exposed: No Bone Exposed: No Limited to Skin Breakdown Treatment Notes Wound #3 (Forehead) Cleanser Soap and Water Discharge Instruction: Gently cleanse wound with antibacterial soap, rinse and pat dry prior to dressing wounds Chea, Theodore A ([id number]) [id number] Page 9 of 9 Peri-Wound Care Topical Primary Dressing Xeroform-HBD 2x2 (in/in) Discharge Instruction: Apply Xeroform-HBD 2x2 (in/in) as directed Secondary Dressing Gauze Discharge Instruction: As  directed: dry, moistened with saline or moistened with [place] Dressing, 4x4 (in/in) elfa Discharge Instruction: Apply over dressing to secure in place. Secured With Compression Wrap Compression Stockings Environmental education officer) Signed: 03/23/2022 3:17:21 PM By: Gretta Cool, BSN, RN, CWS, Kim RN, BSN Signed: 03/24/2022 1:23:45 PM By: Massie Kluver Entered By: Massie Kluver on 03/23/2022 09:39:56 -------------------------------------------------------------------------------- [place] Details Patient Name: Date of Service: Theodore [name], Theodore Y A. 03/23/2022 9:00 A M Medical Record Number: [id number] Patient Account Number: [id number] Date of Birth/Sex: Treating RN: 1929-09-15 (89 y.o. Isac Sarna, Maudie Mercury Primary Care Aziel Morgan: [name] Other Clinician: Massie Kluver Referring Nima Bamburg: Treating Carmelite Violet/Extender: [name] Weeks in Treatment: 9 Vital Signs Time Taken: 09:15 Temperature (F): 97.5 Height (in): 75 Pulse (bpm): 74 Weight (lbs): 168 Respiratory Rate (breaths/min): 16 Body Mass Index (BMI): 21 Blood Pressure (mmHg): 137/81 Reference Range: 80 - 120 mg / dl Electronic Signature(s) Signed: 03/24/2022 1:23:45 PM By: Massie Kluver Entered By: Massie Kluver on 03/23/2022 09:17:57

## 2022-03-23 NOTE — Progress Notes (Addendum)
DEMARRI, ELIE A ([id number]) [id number] Page 1 of 8 Visit Report for 03/23/2022 Chief Complaint Document Details Patient Name: Date of Service: Theodore [name], Theodore [name] 03/23/2022 9:00 A M Medical Record Number: [id number] Patient Account Number: [id number] Date of Birth/Sex: Treating RN: [birth year] (89 y.o. Theodore [name] Primary Care Provider: Deborra [name] Other Clinician: Massie [name] Referring Provider: Treating Provider/Extender: Theodore [name] in Treatment: 9 Information Obtained from: Patient Chief Complaint Right LE Ulcer and scalp abrasion Electronic Signature(s) Signed: 03/23/2022 9:42:54 AM By: Theodore Keeler PA-C Previous Signature: 03/23/2022 9:07:44 AM Version By: Theodore Keeler PA-C Entered By: Theodore [name] on 03/23/2022 09:42:54 -------------------------------------------------------------------------------- HPI Details Patient Name: Date of Service: Theodore [name], Theodore Y A. 03/23/2022 9:00 A M Medical Record Number: [id number] Patient Account Number: [id number] Date of Birth/Sex: Treating RN: [birth year] (89 y.o. Theodore [name] Primary Care Provider: Deborra [name] Other Clinician: Massie [name] Referring Provider: Treating Provider/Extender: Theodore [name] Weeks in Treatment: 9 History of Present Illness HPI Description: ADMISSION 10/03/2021 This is a 89 year old man who was pulling a cart 3 months ago he had some sort of imbalance issue hitting his left anterior lower leg. He is not sure exactly what he hit. He is able to show me photos on his phone that show a laceration on the left anterior lower leg in an inverted L-shaped and. He initially did very little to this himself. Saw his primary doctor on 09/21/2021 [place] instructed him to put Vaseline gauze on this which he did for short period of time. He says that things are getting a lot smaller and are improving he has not really been treating this with anything. The  other major problem the patient has is peripheral arterial disease. He is followed by vein and vascular and has had a series of follow-up arterial studies. Most recently this was done on 09/30/2021 showing on the left and ABI of 0.49 with a great toe pressure of 0.47 and monophasic waveforms. This is somewhat worse than test he had done on 03/21/2021 at which time the ABI was 0.68 TBI of 0.51. I think because of his age and the fact he has underlying stage IIIa chronic kidney disease and the fact the wound is healing they have not been scheduling him for anything aggressive but follow-up studies are apparently booked for 39-month Past medical history includes coronary artery disease, lumbar radiculopathy, COPD which is limiting and stage IIIa chronic renal failure. He has PAD as noted. He also tells me that he had a surgical procedure for essential tremor which left him with some mild dysarthria and gait and balance problems SALDO, SONDGEROTHA ([id number] Page 2 of 8 10-10-2021 upon evaluation today patient appears to be doing decently well in regard to his wound all things considered. He is very slow to heal due to his low ABIs. With that being said it does appear that he is making some progress nonetheless there is little bit of dry skin and tissue around the edges of the wound I think a very light debridement to clear this away could be of benefit for him. The patient is in agreement with that plan. For that reason I did actually perform debridement to clear that away today but did so extremely carefully. 10-17-2021 upon evaluation today patient appears to be doing well currently in regard to his wound in fact this appears to be completely healed based on what I am seeing today. I do not see any evidence of active  infection locally or systemically which is great news. No fevers, chills, nausea, vomiting, or diarrhea. Readmission: 01-13-2022 patient presents today for  follow-up evaluation although the last time I saw him was actually August of this year. At that time he actually was doing decently well with regard to the wound on the left leg. Unfortunately however it was noted that he had noted poor ABIs and again I somehow missed documented the right side at the previous admission but his right ABI was 0.61 with a TBI of 0.38. Based on what I am seeing today and considering his arterial flow which is not very good I do believe that he likely needs to be seen by vascular soon as possible I think that is contributing to his poor healing in regard to this right leg. I discussed that with him today and the individual who I am assuming to be a family member that was present with him as well although I did not inquire in order to confirm. 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 He subsequently had an injury where he sustained a laceration this was on November 24, 2021. He then ended up with a need for reopening this with incision and drainage on 11-29-2021. I did look through the pictures that his family member had as well from the beginning through now and there has been some improvement though a lot of the granulation tissue is somewhat hypergranular there is also some evidence that the bone is drying out of the central portion of the wound. Unfortunately I do not think that this is healing nearly as well as I would like to see. I believe his blood flow is affecting this significantly and I think that needs to be addressed ASAP. Otherwise the patient's past medical history really has not changed significantly since the last time I saw him. That can be referenced above. 11/10; second visit for this wound on the right anterior upper tibia. As I understand things he initially had a fall while putting something in his attic. Initially the wound was sutured but became secondarily infected and had to be opened by general surgery culturing  MSSA. He was followed by Dr. Steva Ready of infectious disease and is completed antibiotics [Keflex]. He has completed this. Currently we are using Xeroform with backing Dakin's changing this daily. He has formal arterial studies ordered for vein and vascular next Wednesday apparently in response to a ABI of 0.6 Patient is asking about Medihoney which they apparently have in the [facility] clinic. I cannot pick up any claudication by history. 01-27-2022 upon evaluation today patient's wound is doing a little bit better and overall appearance fortunately there does not appear to be any signs of infection locally or systemically at this time which is great news and overall I am extremely pleased with where we stand. No fevers, chills, nausea, vomiting, or diarrhea. He did have his arterial study which I did review today. He is ABI on the right was 0.84 with a TBI of 0.97 on the left was 0.62 with a TBI of 0.75. This actually indicates fairly good blood flow all things considered. Fortunately I do not see any evidence of significant infection at this point as well which is also good news. 02-10-2022 upon evaluation today patient appears to be doing well currently in regard to his wound. He is actually been tolerating the dressing changes without complication. Fortunately I do not see any signs of infection which is great news. With that  being said I see a lot of new granulation tissue and I am actually extremely pleased with where we stand compared to last time I saw him I think that there is a lot of new growth over top of the bone area which is excellent news that is exactly what we wanted to see. I think there is probably only about 30% exposure of the bone compared to last time I saw him. 02-17-2022 upon evaluation today patient appears to be doing well currently in regard to his wound. He is slowly showing signs of covering over this bone which is good the tissue is a little hyper granulated but  nonetheless does seem to be doing better than previous. 02-28-2022 upon evaluation today patient appears to be doing excellent in regard to his wound. There are still some hypergranulation but to be honest this has dramatically improved compared to where it was previous. In fact the hypergranulation has been welcomed as this actually helped to cover the bone which was previously exposed there is just a tiny area is still exposed now and overall I think were very close to complete covering. Once we get that covered my plan is to switch to [facility]. 12/28; patient arrives in clinic today with no palpable bone in the right leg wound. He has a small divot superiorly where the bone was exposed we could not identify any exposed bone today. We are using collagen and Xeroform and Tubigrip. He does not have an arterial issue. He does arrive with very significant hypergranulation over the entirety of the wound surface perhaps 80% 01-22-2023 upon evaluation today patient appears to be doing well currently in regard to his leg ulcer which is actually showing signs of excellent improvement. Fortunately I do not see any evidence of infection at this time. He actually is making excellent progress. Electronic Signature(s) Signed: 03/23/2022 10:08:09 AM By: Theodore Keeler PA-C Entered By: Theodore [name] on 03/23/2022 10:08:08 -------------------------------------------------------------------------------- Physical Exam Details Patient Name: Date of Service: Theodore [name], Theodore Y A. 03/23/2022 9:00 A M Medical Record Number: [id number] Patient Account Number: [id number] Date of Birth/Sex: Treating RN: May 27, 1929 (89 y.o. Theodore [name] Primary Care Provider: Deborra [name] Other Clinician: Massie [name] Referring Provider: Treating Provider/Extender: Theodore [name] Weeks in Treatment: [address] [place], [place] ([id number]) [id number] Page 3 of 8 Well-nourished and  well-hydrated in no acute distress. Respiratory normal breathing without difficulty. Psychiatric this patient is able to make decisions and demonstrates good insight into disease process. Alert and Oriented x 3. pleasant and cooperative. Notes Upon inspection patient's wound bed actually showed signs of dramatic improvement there is no bone exposure and overall there is just a very small area remaining to grow new tissue. I think that he is very close to complete resolution. Electronic Signature(s) Signed: 03/23/2022 10:08:22 AM By: Theodore Keeler PA-C Entered By: Theodore [name] on 03/23/2022 10:08:22 -------------------------------------------------------------------------------- Physician Orders Details Patient Name: Date of Service: Theodore [name], Theodore Y A. 03/23/2022 9:00 A M Medical Record Number: [id number] Patient Account Number: [id number] Date of Birth/Sex: Treating RN: [birth year] (89 y.o. 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 - patient seen at [facility] Fax [phone] Return A Bathing/ Shower/ Hygiene Wash wounds with antibacterial soap and water. May shower; gently cleanse wound with antibacterial soap, rinse and pat dry prior to dressing wounds Anesthetic (Use 'Patient Medications' Section for Anesthetic Order Entry) Lidocaine applied to wound bed Edema Control - Lymphedema / Segmental Compressive Device / Other Elevate, Exercise Daily and A void Standing for Long Periods of  Time. Elevate legs to the level of the heart and pump ankles as often as possible Elevate leg(s) parallel to the floor when sitting. Wound Treatment Wound #2 - Lower Leg Wound Laterality: Right, Midline, Anterior Cleanser: Soap and Water Every Other Day/30 Days Discharge Instructions: Gently cleanse wound with antibacterial soap, rinse and pat dry prior to dressing wounds Prim Dressing: Xeroform-HBD 2x2 (in/in) Every Other Day/30 Days ary Discharge Instructions: Apply Xeroform-HBD 2x2 (in/in) as directed Secured With: Kerlix Roll Sterile or Non-Sterile 6-ply 4.5x4 (yd/yd) Every Other Day/30 Days Discharge Instructions: Apply Kerlix as directed Secured With: Tubigrip Size D, 3x10 (in/yd) Every Other Day/30 Days Discharge Instructions: single layer TYRIAN, PEART A ([id number]) [id number] Page 4 of 8 Wound #3 - Forehead Cleanser: Soap and Water Every Other Day/30 Days Discharge Instructions: Gently cleanse wound with antibacterial soap, rinse and pat dry prior to dressing wounds Prim Dressing: Xeroform-HBD 2x2 (in/in) Every Other Day/30 Days ary Discharge Instructions: Apply Xeroform-HBD 2x2 (in/in) as directed Secondary Dressing: Gauze Every Other Day/30 Days Discharge Instructions: As directed: dry, moistened with saline or moistened with Dakins Solution Secondary Dressing: T Adhesive [employer], 4x4 (in/in) Every Other Day/30 Days elfa Discharge Instructions: Apply over dressing to secure in place. Electronic Signature(s) Signed: 03/23/2022 5:05:26 PM By: Theodore Keeler PA-C Signed: 03/24/2022 1:23:45 PM By: Theodore [name] Entered By: Theodore [name] on 03/23/2022 09:45:26 -------------------------------------------------------------------------------- Problem List Details Patient Name: Date of Service: Theodore [name], Theodore Y A. 03/23/2022 9:00 A M Medical Record Number: [id number] Patient Account Number: [id number] Date of Birth/Sex: Treating RN: [birth year] (89  y.o. 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 01/13/2022 No Yes S00.01XA Abrasion of scalp, initial encounter 03/23/2022 No Yes Inactive Problems Resolved Problems Electronic Signature(s) Signed: 03/23/2022 9:42:35 AM By: Theodore Keeler PA-C Previous Signature: 03/23/2022 9:07:40 AM Version By: Elsie Amis, Javoni A ([id number]) [id number] Page 5 of 8 Previous Signature: 03/23/2022 9:07:40 AM Version By: Theodore Keeler PA-C Entered By: Theodore [name] on 03/23/2022 09:42:35 -------------------------------------------------------------------------------- Progress Note Details Patient Name: Date of Service: Theodore [name], Theodore Y A. 03/23/2022 9:00 A M Medical Record Number: [id number] Patient Account Number: [id number] Date of Birth/Sex: Treating RN: 11/25/1929 (89 y.o. Theodore [name] Primary Care Provider: Deborra [name] Other Clinician: Massie [name] Referring Provider: Treating Provider/Extender: Theodore [name] in Treatment: 9 Subjective Chief Complaint Information obtained from Patient Right LE Ulcer and scalp abrasion History of Present Illness (HPI) ADMISSION 10/03/2021 This is a 89 year old man who was pulling a cart 3 months ago he had some sort of imbalance issue hitting his left anterior lower leg. He is not sure exactly what he hit. He is  able to show me photos on his phone that show a laceration on the left anterior lower leg in an inverted L-shaped  and. He initially did very little to this himself. Saw his primary doctor on 09/21/2021 [place] instructed him to put Vaseline gauze on this which he did for short period of time. He says that things are getting a lot smaller and are improving he has not really been treating this with anything. The other major problem the patient has is peripheral arterial disease. He is followed by vein and vascular and has had a series of follow-up arterial studies. Most recently this was done on 09/30/2021 showing on the left and ABI of 0.49 with a great toe pressure of 0.47 and monophasic waveforms. This is somewhat worse than test he had done on 03/21/2021 at which time the ABI was 0.68 TBI of 0.51. I think because of his age and the fact he has underlying stage IIIa chronic kidney disease and the fact the wound is healing they have not been scheduling him for anything aggressive but follow-up studies are apparently booked for 71-month Past medical history includes coronary artery disease, lumbar radiculopathy, COPD which is limiting and stage IIIa chronic renal failure. He has PAD as noted. He also tells me that he had a surgical procedure for essential tremor which left him with some mild dysarthria and gait and balance problems 10-10-2021 upon evaluation today patient appears to be doing decently well in regard to his wound all things considered. He is very slow to heal due to his low ABIs. With that being said it does appear that he is making some progress nonetheless there is little bit of dry skin and tissue around the edges of the wound I think a very light debridement to clear this away could be of benefit for him. The patient is in agreement with that plan. For that reason I did actually perform debridement to clear that away today but did so extremely carefully. 10-17-2021 upon evaluation today patient appears to be doing well currently in regard to his wound in fact this appears to be completely healed based  on what I am seeing today. I do not see any evidence of active infection locally or systemically which is great news. No fevers, chills, nausea, vomiting, or diarrhea. Readmission: 01-13-2022 patient presents today for follow-up evaluation although the last time I saw him was actually August of this year. At that time he actually was doing decently well with regard to the wound on the left leg. Unfortunately however it was noted that he had noted poor ABIs and again I somehow missed documented the right side at the previous admission but his right ABI was 0.61 with a TBI of 0.38. Based on what I am seeing today and considering his arterial flow which is not very good I do believe that he likely needs to be seen by vascular soon as possible I think that is contributing to his poor healing in regard to this right leg. I discussed that with him today and the individual who I am assuming to be a family member that was present with him as well although I did not inquire in order to confirm. 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 He subsequently had an injury where he sustained a laceration this was on November 24, 2021. He then ended up with a need for reopening this with incision and drainage on 11-29-2021. I did look through the pictures that his  family member had as well from the beginning through now and there has been some improvement though a lot of the granulation tissue is somewhat hypergranular there is also some evidence that the bone is drying out of the central portion of the wound. Unfortunately I do not think that this is healing nearly as well as I would like to see. I believe his blood flow is affecting this significantly and I think that needs to be addressed ASAP. Otherwise the patient's past medical history really has not changed significantly since the last time I saw him. That can be referenced above. 11/10; second visit for this wound on the right  anterior upper tibia. As I understand things he initially had a fall while putting something in his attic. Initially the wound was sutured but became secondarily infected and had to be opened by general surgery culturing MSSA. He was followed by Dr. Steva Ready of infectious disease and is completed antibiotics [Keflex]. He has completed this. Currently we are using Xeroform with backing Dakin's changing this daily. He has formal arterial studies ordered for vein and vascular next Wednesday apparently in response to a ABI of 0.6 Patient is asking about Medihoney which they apparently have in the [facility] clinic. I cannot pick up any claudication by history. 01-27-2022 upon evaluation today patient's wound is doing a little bit better and overall appearance fortunately there does not appear to be any signs of infection locally or systemically at this time which is great news and overall I am extremely pleased with where we stand. No fevers, chills, nausea, vomiting, or diarrhea. He did have his arterial study which I did review today. He is ABI on the right was 0.84 with a TBI of 0.97 on the left was 0.62 with a TBI of 0.75. This actually indicates fairly good blood flow all things considered. Fortunately I do not see any evidence of significant infection at this point as well which is also good news. 02-10-2022 upon evaluation today patient appears to be doing well currently in regard to his wound. He is actually been tolerating the dressing changes without AKHILESH, SASSONE A ([id number]) [id number] Page 6 of 8 complication. Fortunately I do not see any signs of infection which is great news. With that being said I see a lot of new granulation tissue and I am actually extremely pleased with where we stand compared to last time I saw him I think that there is a lot of new growth over top of the bone area which is excellent news that is exactly what we wanted to see. I think there  is probably only about 30% exposure of the bone compared to last time I saw him. 02-17-2022 upon evaluation today patient appears to be doing well currently in regard to his wound. He is slowly showing signs of covering over this bone which is good the tissue is a little hyper granulated but nonetheless does seem to be doing better than previous. 02-28-2022 upon evaluation today patient appears to be doing excellent in regard to his wound. There are still some hypergranulation but to be honest this has dramatically improved compared to where it was previous. In fact the hypergranulation has been welcomed as this actually helped to cover the bone which was previously exposed there is just a tiny area is still exposed now and overall I think were very close to complete covering. Once we get that covered my plan is to switch to [facility]. 12/28; patient arrives in clinic  today with no palpable bone in the right leg wound. He has a small divot superiorly where the bone was exposed we could not identify any exposed bone today. We are using collagen and Xeroform and Tubigrip. He does not have an arterial issue. He does arrive with very significant hypergranulation over the entirety of the wound surface perhaps 80% 01-22-2023 upon evaluation today patient appears to be doing well currently in regard to his leg ulcer which is actually showing signs of excellent improvement. Fortunately I do not see any evidence of infection at this time. He actually is making excellent progress. Objective Constitutional Well-nourished and well-hydrated in no acute distress. Vitals Time Taken: 9:15 AM, Height: 75 in, Weight: 168 lbs, BMI: 21, Temperature: 97.5 F, Pulse: 74 bpm, Respiratory Rate: 16 breaths/min, Blood Pressure: 137/81 mmHg. Respiratory normal breathing without difficulty. Psychiatric this patient is able to make decisions and demonstrates good insight into disease process. Alert and Oriented x 3.  pleasant and cooperative. General Notes: Upon inspection patient's wound bed actually showed signs of dramatic improvement there is no bone exposure and overall there is just a very small area remaining to grow new tissue. I think that he is very close to complete resolution. Integumentary (Hair, Skin) Wound #2 status is Open. Original cause of wound was Laceration. The date acquired was: 11/24/2021. The wound has been in treatment 9 weeks. The wound is located on the Right,Midline,Anterior Lower Leg. The wound measures 1.4cm length x 0.6cm width x 0.1cm depth; 0.66cm^2 area and 0.066cm^3 volume. There is bone and Fat Layer (Subcutaneous Tissue) exposed. There is a medium amount of serosanguineous drainage noted. There is small (1-33%) red, hyper - granulation within the wound bed. There is a small (1-33%) amount of necrotic tissue within the wound bed including Adherent Slough and Necrosis of Bone. Wound #3 status is Open. Original cause of wound was Trauma. The date acquired was: 03/22/2022. The wound is located on the Forehead. The wound measures 2.8cm length x 1cm width x 0.1cm depth; 2.199cm^2 area and 0.22cm^3 volume. The wound is limited to skin breakdown. There is no tunneling or undermining noted. There is a small amount of serosanguineous drainage noted. 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 - patient [facility] at [facility] Fax [phone] Bathing/ Shower/ Hygiene: Wash wounds with antibacterial soap and water. May shower; gently cleanse wound with antibacterial soap, rinse and pat dry prior to dressing wounds Anesthetic (Use 'Patient Medications' Section for Anesthetic Order  Entry): Lidocaine applied to wound bed Edema Control - Lymphedema / Segmental Compressive Device / Other: Elevate, Exercise Daily and Avoid Standing for Long Periods of Time. Elevate legs to the level of the heart and pump ankles as often as possible Elevate leg(s) parallel to the floor when sitting. WOUND #2: - Lower Leg Wound Laterality: Right, Midline, Anterior [place], [place] A ([id number]) [id number] Page 7 of 8 Cleanser: Soap and Water Every Other Day/30 Days Discharge Instructions: Gently cleanse wound with antibacterial soap, rinse and pat dry prior to dressing wounds Prim Dressing: Xeroform-HBD 2x2 (in/in) Every Other Day/30 Days ary Discharge Instructions: Apply Xeroform-HBD 2x2 (in/in) as directed Secured With: Kerlix Roll Sterile or Non-Sterile 6-ply 4.5x4 (yd/yd) Every Other Day/30 Days Discharge Instructions: Apply Kerlix as directed Secured With: Tubigrip  Size D, 3x10 (in/yd) Every Other Day/30 Days Discharge Instructions: single layer WOUND #3: - Forehead Wound Laterality: Cleanser: Soap and Water Every Other Day/30 Days Discharge Instructions: Gently cleanse wound with antibacterial soap, rinse and pat dry prior to dressing wounds Prim Dressing: Xeroform-HBD 2x2 (in/in) Every Other Day/30 Days ary Discharge Instructions: Apply Xeroform-HBD 2x2 (in/in) as directed Secondary Dressing: Gauze Every Other Day/30 Days Discharge Instructions: As directed: dry, moistened with saline or moistened with Dakins Solution Secondary Dressing: T Adhesive [employer], 4x4 (in/in) Every Other Day/30 Days elfa Discharge Instructions: Apply over dressing to secure in place. 1. I am going to recommend that we have the patient continue to monitor for any signs of infection or worsening. Obviously if anything changes he knows contact the office and let me know. 2. I am also can recommend the patient should continue to elevate his legs much as possible. Obviously I  think that elevation is going to be ideal here. 3. In regard to the scalp wound this is a very superficial abrasion we will get you Xeroform over this I really think he will heal quite nicely and readily. Obviously if he has any concerns or the do at [facility] they should let me know otherwise we will see him back for reevaluation next week. We will see patient back for reevaluation in 1 week here in the clinic. If anything worsens or changes patient will contact our office for additional recommendations. Electronic Signature(s) Signed: 03/23/2022 10:09:08 AM By: Theodore Keeler PA-C Entered By: Theodore [name] on 03/23/2022 10:09:08 -------------------------------------------------------------------------------- SuperBill Details Patient Name: Date of Service: Fromm, Theodore Y A. 03/23/2022 Medical Record Number: [id number] Patient Account Number: [id number] Date of Birth/Sex: Treating RN: November 24, 1929 (89 y.o. 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 S00.01XA Abrasion of scalp, initial encounter Facility Procedures : CPT4 Code: [id number] Description: 45809 - WOUND CARE VISIT-LEV 3 EST PT Modifier: Quantity: 1 Physician Procedures : CPT4 Code Description Modifier [id number] - WC PHYS LEVEL 3 - EST PT ICD-10 Diagnosis Description Q73.419F Abrasion, right lower leg, sequela L97.812 Non-pressure chronic ulcer of other part of right lower leg with fat layer exposed [place], [place]  ([id number]) [id number] [phone] Atherosclerosis of native arteries of right  leg with ulceration of unspecified site S00.01XA Abrasion of scalp, initial encounter Quantity: 1 Page 8 of 8 Electronic Signature(s) Signed: 03/23/2022 10:10:18 AM By: Theodore Keeler PA-C Entered By: Theodore [name] on 03/23/2022 10:10:17

## 2022-03-28 ENCOUNTER — Ambulatory Visit (INDEPENDENT_AMBULATORY_CARE_PROVIDER_SITE_OTHER): Payer: PPO | Admitting: Internal Medicine

## 2022-03-28 ENCOUNTER — Encounter: Payer: Self-pay | Admitting: Internal Medicine

## 2022-03-28 ENCOUNTER — Ambulatory Visit: Payer: PPO | Admitting: Speech Pathology

## 2022-03-28 ENCOUNTER — Encounter: Payer: PPO | Admitting: Speech Pathology

## 2022-03-28 VITALS — BP 158/60 | HR 59 | Ht 74.0 in | Wt 172.8 lb

## 2022-03-28 DIAGNOSIS — R29898 Other symptoms and signs involving the musculoskeletal system: Secondary | ICD-10-CM | POA: Diagnosis not present

## 2022-03-28 DIAGNOSIS — R41841 Cognitive communication deficit: Secondary | ICD-10-CM | POA: Diagnosis not present

## 2022-03-28 DIAGNOSIS — T50905A Adverse effect of unspecified drugs, medicaments and biological substances, initial encounter: Secondary | ICD-10-CM

## 2022-03-28 DIAGNOSIS — R2681 Unsteadiness on feet: Secondary | ICD-10-CM | POA: Diagnosis not present

## 2022-03-28 DIAGNOSIS — F411 Generalized anxiety disorder: Secondary | ICD-10-CM

## 2022-03-28 DIAGNOSIS — R2689 Other abnormalities of gait and mobility: Secondary | ICD-10-CM | POA: Diagnosis not present

## 2022-03-28 DIAGNOSIS — G309 Alzheimer's disease, unspecified: Secondary | ICD-10-CM

## 2022-03-28 DIAGNOSIS — F430 Acute stress reaction: Secondary | ICD-10-CM

## 2022-03-28 MED ORDER — ALPRAZOLAM 0.25 MG PO TABS: 0.2500 mg | ORAL_TABLET | Freq: Two times a day (BID) | ORAL | 2 refills | Status: DC | PRN

## 2022-03-28 MED ORDER — PAROXETINE HCL 10 MG PO TABS: 10.0000 mg | ORAL_TABLET | Freq: Every day | ORAL | 1 refills | Status: DC

## 2022-03-28 NOTE — Progress Notes (Signed)
Subjective:  Patient ID: Theodore [name], male    DOB: 10-21-1929  Age: 89 y.o. MRN: [id number]  CC: The primary encounter diagnosis was Adverse drug reaction, initial encounter. A diagnosis of Anxiety in acute stress reaction was also pertinent to this visit.   HPI COHEN BOETTNER presents for AN URGENT WORK IN FOR ANXIETY  Chief Complaint  Patient presents with   Medical Management of Chronic Issues    Increased anxiety   PATIENT IS a  89 YR OLD MALE WITH A HISTOR OF DEPRESSION, MCI  manage with aricept , history of intention tremor who was seen by speech pathology today for worsening dysarthria and was noted to be extremely agitation and in turmoil.  He is accompanied by his wife, Theodore [name], who confirms that he has been increasingly agitated over the last two weeks .  He has been taking wellbutrin for management of depression. But has increased financial concerns and has been unable to sleep and having episodes osf=    Outpatient Medications Prior to Visit  Medication Sig Dispense Refill   ascorbic acid (VITAMIN C) 500 MG tablet Take 500 mg by mouth daily.     Cholecalciferol (VITAMIN D3) 50 MCG (2000 UT) TABS Take 1 tablet by mouth daily.     cycloSPORINE (RESTASIS) 0.05 % ophthalmic emulsion Place 1 drop into both eyes 2 (two) times daily.     donepezil (ARICEPT) 10 MG tablet Take 10 mg by mouth every morning.     ezetimibe (ZETIA) 10 MG tablet TAKE 1 TABLET BY MOUTH EVERY DAY 60 tablet 0   fluticasone (FLONASE) 50 MCG/ACT nasal spray SPRAY 2 SPRAYS INTO EACH NOSTRIL EVERY DAY 48 mL 1   levothyroxine (SYNTHROID) 100 MCG tablet TAKE 1 TABLET BY MOUTH EVERY DAY BEFORE BREAKFAST 90 tablet 0   magnesium oxide (MAG-OX) 400 MG tablet Take 400 mg by mouth daily.     rosuvastatin (CRESTOR) 10 MG tablet Take 1 tablet (10 mg total) by mouth daily. (Patient taking differently: Take 10 mg by mouth every other day.) 90 tablet 1   Saccharomyces boulardii (PROBIOTIC) 250 MG CAPS Take 1 capsule by mouth  daily at 12 noon.     testosterone cypionate (DEPOTESTOSTERONE CYPIONATE) 200 MG/ML injection INJECT 0.5 ML (100 MG TOTAL) INTO THE MUSCLE EVERY 14 DAYS. 1 mL 2   vitamin B-12 (CYANOCOBALAMIN) 500 MCG tablet Take 1 tablet by mouth daily.     buPROPion (WELLBUTRIN SR) 150 MG 12 hr tablet Take 1 tablet (150 mg total) by mouth daily. 90 tablet 3   albuterol (PROVENTIL) (2.5 MG/3ML) 0.083% nebulizer solution Take 3 mLs (2.5 mg total) by nebulization every 6 (six) hours as needed for wheezing or shortness of breath. (Patient not taking: Reported on 03/28/2022) 60 mL 0   budesonide (PULMICORT) 0.5 MG/2ML nebulizer solution Take 2 mLs (0.5 mg total) by nebulization in the morning and at bedtime for 7 days. 28 mL 0   No facility-administered medications prior to visit.    Review of Systems;  Patient denies headache, fevers, malaise, unintentional weight loss, skin rash, eye pain, sinus congestion and sinus pain, sore throat, dysphagia,  hemoptysis , cough, dyspnea, wheezing, chest pain, palpitations, orthopnea, edema, abdominal pain, nausea, melena, diarrhea, constipation, flank pain, dysuria, hematuria, urinary  Frequency, nocturia, numbness, tingling, seizures,  Focal weakness, Loss of consciousness,  Tremor,  depression, , and suicidal ideation.      Objective:  BP (!) 158/60   Pulse (!) 59   Ht 6'  2" (1.88 m)   Wt 172 lb 12.8 oz (78.4 kg)   SpO2 97%   BMI 22.19 kg/m   BP Readings from Last 3 Encounters:  03/28/22 (!) 158/60  03/08/22 (!) 158/77  03/01/22 138/68    Wt Readings from Last 3 Encounters:  03/28/22 172 lb 12.8 oz (78.4 kg)  03/10/22 168 lb (76.2 kg)  03/01/22 171 lb 6.4 oz (77.7 kg)    Physical Exam Vitals reviewed.  Constitutional:      General: He is not in acute distress.    Appearance: Normal appearance. He is not ill-appearing, toxic-appearing or diaphoretic.  HENT:     Head: Normocephalic.  Eyes:     General: No scleral icterus.       Right eye: No discharge.         Left eye: No discharge.     Conjunctiva/sclera: Conjunctivae normal.  Musculoskeletal:        General: Normal range of motion.     Cervical back: Normal range of motion.  Skin:    General: Skin is warm and dry.  Neurological:     General: No focal deficit present.     Mental Status: He is alert and oriented to person, place, and time. Mental status is at baseline.  Psychiatric:        Attention and Perception: Attention and perception normal.        Mood and Affect: Mood is anxious.        Behavior: Behavior normal.        Thought Content: Thought content normal.        Judgment: Judgment normal.    Lab Results  Component Value Date   HGBA1C 4.8 01/17/2021   HGBA1C 4.7 05/28/2018    Lab Results  Component Value Date   CREATININE 1.19 01/23/2022   CREATININE 1.41 01/04/2022   CREATININE 1.14 12/05/2021    Lab Results  Component Value Date   WBC 6.6 01/04/2022   HGB 12.9 (L) 01/04/2022   HCT 38.5 (L) 01/04/2022   PLT 275.0 01/04/2022   GLUCOSE 106 (H) 01/23/2022   CHOL 197 09/21/2020   TRIG 70.0 09/21/2020   HDL 62.00 09/21/2020   LDLCALC 121 (H) 09/21/2020   ALT 17 01/04/2022   AST 19 01/04/2022   NA 137 01/23/2022   K 4.1 01/23/2022   CL 103 01/23/2022   CREATININE 1.19 01/23/2022   BUN 28 (H) 01/23/2022   CO2 28 01/23/2022   TSH 0.97 08/26/2021   PSA 21.68 (H) 02/11/2021   INR 1.4 (H) 11/29/2021   HGBA1C 4.8 01/17/2021    No results found.  Assessment & Plan:  .Adverse drug reaction, initial encounter Assessment & Plan: Question whether his agitation is aggravated by wellbutrin. . Starting wean   Anxiety in acute stress reaction Assessment & Plan: Starting therapy with paroexeine and prn alprazolam    Other orders -     PARoxetine HCl; Take 1 tablet (10 mg total) by mouth daily.  Dispense: 90 tablet; Refill: 1 -     ALPRAZolam; Take 1 tablet (0.25 mg total) by mouth 2 (two) times daily as needed for anxiety or sleep.  Dispense: 60  tablet; Refill: 2   Follow-up: Return in about 1 week (around 04/04/2022).   [name], MD

## 2022-03-28 NOTE — Patient Instructions (Addendum)
Reduce wellbutrin to one tablet every other day of one week,, then stop  Start Paxil today , one tablet daily with food  After two weeks,  we can increase  the dose if the anxiety is not well controlled  [name] will have a supply of alprazolam to use 1/2 tablet  if needed  for panic attacks and a whole tablet if needed for  insomnia

## 2022-03-28 NOTE — Therapy (Signed)
OUTPATIENT SPEECH LANGUAGE PATHOLOGY TREATMENT NOTE   Patient Name: Theodore [name] MRN: [id number] DOB:[birth year], 89 y.o., male Today's Date: 03/28/2022   PCP: [name], MD REFERRING PROVIDER: [name], MD  END OF SESSION:    End of Session - 03/28/22 2053     Visit Number 5    Number of Visits 25    Date for SLP Re-Evaluation 05/17/22    Authorization Type [employer] P[employer]    Progress Note Due on Visit 10    SLP Start Time 1100    SLP Stop Time  1145    SLP Time Calculation (min) 45 min    Activity Tolerance Other (comment)   session limited by pt's anxiety; session ended early as pt's MD able to see pt at 12pm                    Past Medical History:  Diagnosis Date   Actinic keratosis    Arthritis    knees   Asymptomatic Sinus bradycardia    a. 05/2016 Zio: Avg HR 61 (41-167).   Benign essential tremor    Cancer ([place]) 2002   melanoma right ear   Colon polyp    COPD (chronic obstructive pulmonary disease) ([place])    pt said he believes it is a misdiagnosis    Coronary artery calcification seen on CT scan    a. 06/2016 CTA Chest: cor Ca2+; b. 05/2017 MV: EF >65%. No ischemia/infarct.   Depression    History of echocardiogram    a. 04/2016 Echo: EF 60-65%, mild conc LVH. Nl PASP.   History of kidney stones    Hx of dysplastic nevus 05/07/2017   L anterior shoulder, severe   Hx of dysplastic nevus 09/06/2020   R lower sternum, moderate atypia   Hypothyroidism    Hypothyroidism    Laceration of right lower extremity    Melanoma ([place]) 1990's per pt   R ear   Melanoma in situ ([place]) 01/12/2014   left jaw   MSSA infection, non-invasive    Palpitations    a. 05/2016 Zio: Avg HR 61 (41-167). 9 SVT runs (fastest 167 - 5 beats; longest 8 beats - 101 bpm). Rare PACs/PVCs.   PVC's (premature ventricular contractions)    Rhinitis    Sepsis due to cellulitis ([place]) 11/28/2021   Severe sepsis ([place]) 11/28/2021   Wears dentures    partial upper and  lower   Wears hearing aid in both ears    Past Surgical History:  Procedure Laterality Date   ADENOIDECTOMY     CATARACT EXTRACTION W/PHACO Left 10/22/2017   Procedure: CATARACT EXTRACTION PHACO AND INTRAOCULAR LENS PLACEMENT ([place])  LEFT;  Surgeon: Eulogio Bear, MD;  Location: [facility];  Service: Ophthalmology;  Laterality: Left;   CATARACT EXTRACTION W/PHACO Right 11/20/2017   Procedure: CATARACT EXTRACTION PHACO AND INTRAOCULAR LENS PLACEMENT (IOC) RIGHT;  Surgeon: Eulogio Bear, MD;  Location: [facility];  Service: Ophthalmology;  Laterality: Right;   ESOPHAGOGASTRODUODENOSCOPY (EGD) WITH PROPOFOL N/A 10/31/2016   Procedure: ESOPHAGOGASTRODUODENOSCOPY (EGD) WITH PROPOFOL;  Surgeon: Lollie Sails, MD;  Location: [facility] ENDOSCOPY;  Service: Endoscopy;  Laterality: N/A;   I & D EXTREMITY Right 11/29/2021   Procedure: IRRIGATION AND DEBRIDEMENT EXTREMITY;  Surgeon: Olean Ree, MD;  Location: [facility] ORS;  Service: General;  Laterality: Right;   JOINT REPLACEMENT Right 2003   knee   knee meniscus repair Right    MELANOMA EXCISION Right  ear. Followed by Dr. Nehemiah Massed   PATELLECTOMY Bilateral    TONSILLECTOMY     Patient Active Problem List   Diagnosis Date Noted   Sialorrhea 02/02/2022   Protein-calorie malnutrition, moderate ([place]) 11/29/2021   Dementia in Alzheimer's disease with depression ([place]) 11/28/2021   Dyslipidemia 11/28/2021   GERD without esophagitis 11/28/2021   Wound infection, posttraumatic 09/22/2021   Proximal muscle weakness 08/27/2021   Speech disturbance 08/26/2021   Adverse drug reaction, initial encounter 05/04/2021   Lumbar radiculopathy 04/25/2021   Thoracic aortic atherosclerosis ([place]) 01/17/2021   Dizziness 12/31/2020   Anxiety in acute stress reaction 12/07/2020   Mild cognitive impairment with memory loss 11/30/2020   Elevated PSA, between 10 and less than 20 ng/ml 11/30/2020   Chronic kidney disease, stage 3b ([place])  11/10/2020   Benign hypertensive kidney disease with chronic kidney disease 11/10/2020   COVID-19 virus infection 04/06/2020   Acquired trigger finger of left middle finger 08/07/2019   Carpal tunnel syndrome of right wrist 07/10/2019   Ulnar neuropathy 06/05/2019   Disorder of bursae of shoulder region 01/29/2019   Localized, primary osteoarthritis of shoulder region 01/29/2019   Localized, secondary osteoarthritis of the shoulder region 01/29/2019   Osteoarthritis of knee 01/29/2019   Shoulder joint pain 01/29/2019   Chronic obstructive pulmonary disease ([place]) 09/24/2018   Grief 09/02/2018   Cervical radiculopathy 08/14/2018   PVC (premature ventricular contraction) 03/21/2018   PAD (peripheral artery disease) ([place])    Anterior knee pain, left 12/26/2015   Hypothyroidism 07/04/2014   Nonallergic vasomotor rhinitis 07/01/2014   Hypertrophy of prostate with urinary obstruction and other lower urinary tract symptoms (LUTS) 08/08/2013   Low serum testosterone level 04/28/2013   Actinic keratosis 05/10/2010   History of malignant melanoma of skin 05/10/2010    ONSET DATE: date of referrals 02/01/2022 and 02/22/2022    REFERRING DIAG: R47.1 (ICD-10-CM) - Dysarthria R41.89 (ICD-10-CM) - Cognitive decline     PERTINENT HISTORY: Pt with recent course of Outpatient ST services same deficits (08/26/2021 and 09/07/2021).  [name]      07/21/2019 - pt underwent MRI guided focused ultrasound thalamotomy LEFT sided for treatment of essential tremor of RIGHT arm    07/30/2021 - pt underwent MRI guided focused ultrasound thalamotomy RIGHT sided for treatment of essential tremor of LEFT arm        DIAGNOSTIC FINDINGS:    MRi 07/05/2021 consistent with recent MR guided focus ultrasound ablation in the right subthalamic region A focus of diffusion restriction right subthalamic region measures 9 x 6 mm     MBSS 2017    MBSS 11/04/2021 Pt presents with overall adequate oropharyngeal abilities  when consuming puree, soft solids, thin liquids via spoon and cup as well as whole barium tablet with thin liquids. Pt's swallow function doesn't appear any different than documented in his previous MBSS (2017) which stated, "Thin liquid- small amount of laryngeal penetration with rapid successive swallowing maneuvers. With single swallowing maneuvers no laryngeal penetration is observed. As well in this previous study, pt continues to demonstrate penetration with trace aspiration when consuming successive swallows. Pt demonstrated understanding need to take single sips as he stated that he doesn't consume consecutive sips at baseline. At this time, pt presents with adequate oropharyngeal abilities and reduced risk of aspiration when consuming regular diet with thin liquids via single cup sips and medicine whole with thin liquids. Skilled ST intervention is not indicated.   THERAPY DIAG:  Cognitive communication deficit  Dementia in Alzheimer's disease with  depression ([place])  Rationale for Evaluation and Treatment Rehabilitation  SUBJECTIVE: pt appeared very anxious  Pt accompanied by: significant other  PAIN:  Are you having pain? No  PATIENT GOALS: to get better  OBJECTIVE:   TODAY'S TREATMENT:  Skilled treatment session focused on pt's cognitive communication goals. SLP facilitated session by providing the following interventions:    Pt stated "I am not doing well." Pt describes concern over multiple areas surrounding meeting with his accountant.  SLP facilitated by creating a list of possible questions to email his accountant prior to the meeting so that pt's concern might be lessen. While pt was agreeable to this plan it didn't appear to reduce his anxiety. Pt was observed writhing in his chair, frowning, crying, rubbing his head. With pt's permission, SLP sent secure chat to Dr Derrel Nip with request for urgent appointment for pt. She responded he could be seen at lunch. Session ended 15  minutes early so patient could get to the 12pm appointment.    SLP followed up later with an email to his wife on the content and suggestions for the account.   PATIENT EDUCATION: Education details: see above Person educated: Patient and Spouse Education method: Explanation, Media planner, Verbal cues, and Handouts Education comprehension: needs further education  HOME EXERCISE PROGRAM:  Use list of food items to facilitate oropharyngeal abilities and to increase nutrition.   GOALS: Goals reviewed with patient? Yes  SHORT TERM GOALS: Target date: 10 sessions  Patient will demonstrate understanding of functional cognitive activities for home maintenance program with moderate assistance Baseline: Maximal Goal status: INITIAL   2.  Given moderate assistance, patient will demonstrate knowledge of appropriate activities to support cognitive and  language function outside of ST with assistance from family.  Baseline: Maximal Goal status: INITIAL   3.  Patient will report engagement in cognitive activities outside of ST 4/7 days.  Baseline:  Goal status: INITIAL   4.  Pt will verbalize and demonstrate how to implement 1 memory and attention strategies to aid daily functioning with moderate A over 2 sessions  Baseline: currently not using any strategies Goal status: INITIAL     LONG TERM GOALS: Target date: 05/17/2022 Patient will demonstrate understanding of functional cognitive activities for home maintenance program with minimal assistance Baseline: maximal Goal status: INITIAL  ASSESSMENT:  CLINICAL IMPRESSION: While porgress was made in today's session, pt appeared to struggle greatly with anxiety.   OBJECTIVE IMPAIRMENTS include attention, memory, awareness, executive functioning, dysarthria, dysphagia, and motor speech . These impairments are limiting patient from managing medications, managing appointments, managing finances, household responsibilities, ADLs/IADLs,  effectively communicating at home and in community, and safety when swallowing. Factors affecting potential to achieve goals and functional outcome are ability to learn/carryover information, co-morbidities, cooperation/participation level, medical prognosis, previous level of function, and severity of impairments. Patient will benefit from skilled SLP services to address above impairments and improve overall function.  REHAB POTENTIAL: Fair bilateral thalamotomy  PLAN: SLP FREQUENCY: 1-2x/week  SLP DURATION: 8 weeks  PLANNED INTERVENTIONS: Aspiration precaution training, Environmental controls, Cueing hierachy, Cognitive reorganization, Internal/external aids, Functional tasks, SLP instruction and feedback, Compensatory strategies, and Patient/family education  Gannon Heinzman B. Rutherford Nail, M.S., CCC-SLP, Mining engineer Certified Brain Injury [place]  [place] Office [phone] Ascom [phone] Fax [phone]

## 2022-03-29 NOTE — Assessment & Plan Note (Signed)
Question whether his agitation is aggravated by wellbutrin. . Starting wean

## 2022-03-29 NOTE — Assessment & Plan Note (Signed)
Starting therapy with paroexeine and prn alprazolam

## 2022-03-30 ENCOUNTER — Encounter: Payer: PPO | Admitting: Speech Pathology

## 2022-03-30 ENCOUNTER — Ambulatory Visit: Payer: PPO | Admitting: Speech Pathology

## 2022-03-30 DIAGNOSIS — R41841 Cognitive communication deficit: Secondary | ICD-10-CM

## 2022-03-30 DIAGNOSIS — G309 Alzheimer's disease, unspecified: Secondary | ICD-10-CM

## 2022-03-30 NOTE — Therapy (Signed)
OUTPATIENT SPEECH LANGUAGE PATHOLOGY TREATMENT NOTE   Patient Name: Theodore [name] MRN: [id number] DOB:1929/07/14, 89 y.o., male Today's Date: 03/30/2022   PCP: [name], MD REFERRING PROVIDER: [name], MD  END OF SESSION:    End of Session - 03/30/22 1305     Visit Number 6    Number of Visits 25    Date for SLP Re-Evaluation 05/17/22    Authorization Type [employer]    Progress Note Due on Visit 10    SLP Start Time 1300    SLP Stop Time  1400    SLP Time Calculation (min) 60 min    Activity Tolerance Patient tolerated treatment well                     Past Medical History:  Diagnosis Date   Actinic keratosis    Arthritis    knees   Asymptomatic Sinus bradycardia    a. 05/2016 Zio: Avg HR 61 (41-167).   Benign essential tremor    Cancer ([place]) 2002   melanoma right ear   Colon polyp    COPD (chronic obstructive pulmonary disease) ([place])    pt said he believes it is a misdiagnosis    Coronary artery calcification seen on CT scan    a. 06/2016 CTA Chest: cor Ca2+; b. 05/2017 MV: EF >65%. No ischemia/infarct.   Depression    History of echocardiogram    a. 04/2016 Echo: EF 60-65%, mild conc LVH. Nl PASP.   History of kidney stones    Hx of dysplastic nevus 05/07/2017   L anterior shoulder, severe   Hx of dysplastic nevus 09/06/2020   R lower sternum, moderate atypia   Hypothyroidism    Hypothyroidism    Laceration of right lower extremity    Melanoma ([place]) 1990's per pt   R ear   Melanoma in situ ([place]) 01/12/2014   left jaw   MSSA infection, non-invasive    Palpitations    a. 05/2016 Zio: Avg HR 61 (41-167). 9 SVT runs (fastest 167 - 5 beats; longest 8 beats - 101 bpm). Rare PACs/PVCs.   PVC's (premature ventricular contractions)    Rhinitis    Sepsis due to cellulitis ([place]) 11/28/2021   Severe sepsis ([place]) 11/28/2021   Wears dentures    partial upper and lower   Wears hearing aid in both ears    Past Surgical History:   Procedure Laterality Date   ADENOIDECTOMY     CATARACT EXTRACTION W/PHACO Left 10/22/2017   Procedure: CATARACT EXTRACTION PHACO AND INTRAOCULAR LENS PLACEMENT ([place])  LEFT;  Surgeon: Eulogio Bear, MD;  Location: [facility];  Service: Ophthalmology;  Laterality: Left;   CATARACT EXTRACTION W/PHACO Right 11/20/2017   Procedure: CATARACT EXTRACTION PHACO AND INTRAOCULAR LENS PLACEMENT (I[facility]) RIGHT;  Surgeon: Eulogio Bear, MD;  Location: [facility];  Service: Ophthalmology;  Laterality: Right;   ESOPHAGOGASTRODUODENOSCOPY (EGD) WITH PROPOFOL N/A 10/31/2016   Procedure: ESOPHAGOGASTRODUODENOSCOPY (EGD) WITH PROPOFOL;  Surgeon: Lollie Sails, MD;  Location[facility] ENDOSCOPY;  Service: Endoscopy;  Laterality: N/A;   I & D EXTREMITY Right 11/29/2021   Procedure: IRRIGATION AND DEBRIDEMENT EXTREMITY;  Surgeon: Olean Ree, MD;  Location: [facility] ORS;  Service: General;  Laterality: Right;   JOINT REPLACEMENT Right 2003   knee   knee meniscus repair Right    MELANOMA EXCISION Right    ear. Followed by Dr. Nehemiah Massed   PATELLECTOMY Bilateral    TONSILLECTOMY  Patient Active Problem List   Diagnosis Date Noted   Sialorrhea 02/02/2022   Protein-calorie malnutrition, moderate ([place]) 11/29/2021   Dementia in Alzheimer's disease with depression ([place]) 11/28/2021   Dyslipidemia 11/28/2021   GERD without esophagitis 11/28/2021   Wound infection, posttraumatic 09/22/2021   Proximal muscle weakness 08/27/2021   Speech disturbance 08/26/2021   Adverse drug reaction, initial encounter 05/04/2021   Lumbar radiculopathy 04/25/2021   Thoracic aortic atherosclerosis ([place]) 01/17/2021   Dizziness 12/31/2020   Anxiety in acute stress reaction 12/07/2020   Mild cognitive impairment with memory loss 11/30/2020   Elevated PSA, between 10 and less than 20 ng/ml 11/30/2020   Chronic kidney disease, stage 3b ([place]) 11/10/2020   Benign hypertensive kidney disease with chronic kidney  disease 11/10/2020   COVID-19 virus infection 04/06/2020   Acquired trigger finger of left middle finger 08/07/2019   Carpal tunnel syndrome of right wrist 07/10/2019   Ulnar neuropathy 06/05/2019   Disorder of bursae of shoulder region 01/29/2019   Localized, primary osteoarthritis of shoulder region 01/29/2019   Localized, secondary osteoarthritis of the shoulder region 01/29/2019   Osteoarthritis of knee 01/29/2019   Shoulder joint pain 01/29/2019   Chronic obstructive pulmonary disease ([place]) 09/24/2018   Grief 09/02/2018   Cervical radiculopathy 08/14/2018   PVC (premature ventricular contraction) 03/21/2018   PAD (peripheral artery disease) ([place])    Anterior knee pain, left 12/26/2015   Hypothyroidism 07/04/2014   Nonallergic vasomotor rhinitis 07/01/2014   Hypertrophy of prostate with urinary obstruction and other lower urinary tract symptoms (LUTS) 08/08/2013   Low serum testosterone level 04/28/2013   Actinic keratosis 05/10/2010   History of malignant melanoma of skin 05/10/2010    ONSET DATE: date of referrals 02/01/2022 and 02/22/2022    REFERRING DIAG: R47.1 (ICD-10-CM) - Dysarthria R41.89 (ICD-10-CM) - Cognitive decline     PERTINENT HISTORY: Pt with recent course of Outpatient ST services same deficits (08/26/2021 and 09/07/2021).  [name]      07/21/2019 - pt underwent MRI guided focused ultrasound thalamotomy LEFT sided for treatment of essential tremor of RIGHT arm    07/30/2021 - pt underwent MRI guided focused ultrasound thalamotomy RIGHT sided for treatment of essential tremor of LEFT arm        DIAGNOSTIC FINDINGS:    MRi 07/05/2021 consistent with recent MR guided focus ultrasound ablation in the right subthalamic region A focus of diffusion restriction right subthalamic region measures 9 x 6 mm     MBSS 2017    MBSS 11/04/2021 Pt presents with overall adequate oropharyngeal abilities when consuming puree, soft solids, thin liquids via spoon and cup as  well as whole barium tablet with thin liquids. Pt's swallow function doesn't appear any different than documented in his previous MBSS (2017) which stated, "Thin liquid- small amount of laryngeal penetration with rapid successive swallowing maneuvers. With single swallowing maneuvers no laryngeal penetration is observed. As well in this previous study, pt continues to demonstrate penetration with trace aspiration when consuming successive swallows. Pt demonstrated understanding need to take single sips as he stated that he doesn't consume consecutive sips at baseline. At this time, pt presents with adequate oropharyngeal abilities and reduced risk of aspiration when consuming regular diet with thin liquids via single cup sips and medicine whole with thin liquids. Skilled ST intervention is not indicated.   THERAPY DIAG:  Cognitive communication deficit  Dementia in Alzheimer's disease with depression ([place])  Rationale for Evaluation and Treatment Rehabilitation  SUBJECTIVE: pt appeared very anxious  Pt  accompanied by: significant other  PAIN:  Are you having pain? No  PATIENT GOALS: to get better  OBJECTIVE:   TODAY'S TREATMENT:  Skilled treatment session focused on pt's cognitive communication goals. SLP facilitated session by providing the following interventions:    When discussing topics/activities that are going well in hopes that ST can create interventions, pt became increasing anxious. SLP changed focus of therapy to information providing rather than focus on specific areas of current stress. The information provided is transferable to currently identified areas with pt's daily living.    PATIENT EDUCATION: Education details: see above Person educated: Patient and Spouse Education method: Consulting civil engineer, Media planner, Verbal cues, and Handouts Education comprehension: needs further education  HOME EXERCISE PROGRAM:  Recommend pt and his wife continue to do activities together,  pt make his wife aware of information that he gathers from conversation with accountant.   GOALS: Goals reviewed with patient? Yes  SHORT TERM GOALS: Target date: 10 sessions  Patient will demonstrate understanding of functional cognitive activities for home maintenance program with moderate assistance Baseline: Maximal Goal status: INITIAL   2.  Given moderate assistance, patient will demonstrate knowledge of appropriate activities to support cognitive and  language function outside of ST with assistance from family.  Baseline: Maximal Goal status: INITIAL   3.  Patient will report engagement in cognitive activities outside of ST 4/7 days.  Baseline:  Goal status: INITIAL   4.  Pt will verbalize and demonstrate how to implement 1 memory and attention strategies to aid daily functioning with moderate A over 2 sessions  Baseline: currently not using any strategies Goal status: INITIAL     LONG TERM GOALS: Target date: 05/17/2022 Patient will demonstrate understanding of functional cognitive activities for home maintenance program with minimal assistance Baseline: maximal Goal status: INITIAL  ASSESSMENT:  CLINICAL IMPRESSION: Pt with improved ability to access information presented within today's session.   OBJECTIVE IMPAIRMENTS include attention, memory, awareness, executive functioning, dysarthria, dysphagia, and motor speech . These impairments are limiting patient from managing medications, managing appointments, managing finances, household responsibilities, ADLs/IADLs, effectively communicating at home and in community, and safety when swallowing. Factors affecting potential to achieve goals and functional outcome are ability to learn/carryover information, co-morbidities, cooperation/participation level, medical prognosis, previous level of function, and severity of impairments. Patient will benefit from skilled SLP services to address above impairments and improve overall  function.  REHAB POTENTIAL: Fair bilateral thalamotomy  PLAN: SLP FREQUENCY: 1-2x/week  SLP DURATION: 8 weeks  PLANNED INTERVENTIONS: Aspiration precaution training, Environmental controls, Cueing hierachy, Cognitive reorganization, Internal/external aids, Functional tasks, SLP instruction and feedback, Compensatory strategies, and Patient/family education  Jasan Doughtie B. Rutherford Nail, M.S., CCC-SLP, Mining engineer Certified Brain Injury [place]  [place] Office [phone] Ascom [phone] Fax [phone]

## 2022-03-31 ENCOUNTER — Encounter: Payer: PPO | Admitting: Physician Assistant

## 2022-03-31 DIAGNOSIS — I70238 Atherosclerosis of native arteries of right leg with ulceration of other part of lower right leg: Secondary | ICD-10-CM | POA: Diagnosis not present

## 2022-03-31 DIAGNOSIS — L97812 Non-pressure chronic ulcer of other part of right lower leg with fat layer exposed: Secondary | ICD-10-CM | POA: Diagnosis not present

## 2022-03-31 DIAGNOSIS — S0001XA Abrasion of scalp, initial encounter: Secondary | ICD-10-CM | POA: Diagnosis not present

## 2022-03-31 NOTE — Progress Notes (Addendum)
TIONNE, DAYHOFF [name] ([id number]) [id number] Page 1 of 7 Visit Report for 03/31/2022 Chief Complaint Document Details Patient Name: Date of Service: Theodore [name], Theodore [name] 03/31/2022 9:15 [name] Medical Record Number: [id number] Patient Account Number: [id number] Date of Birth/Sex: Treating RN: 01/05/1930 (89 y.o. Theodore [name] Primary Care Provider: [name] Other Clinician: Referring Provider: Treating Provider/Extender: [name] in Treatment: 11 Information Obtained from: Patient Chief Complaint Right LE Ulcer and scalp abrasion Electronic Signature(s) Signed: 03/31/2022 9:42:36 AM By: Worthy Keeler PA-C Entered By: Worthy Keeler on 03/31/2022 09:42:36 -------------------------------------------------------------------------------- HPI Details Patient Name: Date of Service: Theodore [name], Theodore [name]. 03/31/2022 9:15 [name] Medical Record Number: [id number] Patient Account Number: [id number] Date of Birth/Sex: Treating RN: 27-Aug-1929 (89 y.o. Theodore [name] Primary Care Provider: [name] Other Clinician: Referring Provider: Treating Provider/Extender: [name] in Treatment: 11 History of Present Illness HPI Description: ADMISSION 10/03/2021 This is [name] 89 year old man who was pulling [name] cart 3 months ago he had some sort of imbalance issue hitting his left anterior lower leg. He is not sure exactly what he hit. He is able to show me photos on his phone that show [name] laceration on the left anterior lower leg in an inverted L-shaped and. He initially did very little to this himself. Saw his primary doctor on 09/21/2021 [place] instructed him to put Vaseline gauze on this which he did for short period of time. He says that things are getting [name] lot smaller and are improving he has not really been treating this with anything. The other major problem the patient has is peripheral arterial disease. He is followed by vein and vascular and  has had [name] series of follow-up arterial studies. Most recently this was done on 09/30/2021 showing on the left and ABI of 0.49 with [name] great toe pressure of 87 and monophasic waveforms. This is somewhat worse than test he had done on 03/21/2021 at which time the ABI was 0.68 TBI of 0.51. I think because of his age and the fact he has underlying stage IIIa chronic kidney disease and the fact the wound is healing they have not been scheduling him for anything aggressive but follow-up studies are apparently booked for 16-month Past medical history includes coronary artery disease, lumbar radiculopathy, COPD which is limiting and stage IIIa chronic renal failure. He has PAD as noted. He also tells me that he had [name] surgical procedure for essential tremor which left him with some mild dysarthria and gait and balance problems SRYLIE, KNIERIMA ([id number] Page 2 of 7 10-10-2021 upon evaluation today patient appears to be doing decently well in regard to his wound all things considered. He is very slow to heal due to his low ABIs. With that being said it does appear that he is making some progress nonetheless there is little bit of dry skin and tissue around the edges of the wound I think [name] very light debridement to clear this away could be of benefit for him. The patient is in agreement with that plan. For that reason I did actually perform debridement to clear that away today but did so extremely carefully. 10-17-2021 upon evaluation today patient appears to be doing well currently in regard to his wound in fact this appears to be completely healed based on what I am seeing today. I do not see any evidence of active infection locally or systemically which is great news. No fevers, chills, nausea, vomiting, or diarrhea.  Readmission: 01-13-2022 patient presents today for follow-up evaluation although the last time I saw him was actually August of this year. At that time he  actually was doing decently well with regard to the wound on the left leg. Unfortunately however it was noted that he had noted poor ABIs and again I somehow missed documented the right side at the previous admission but his right ABI was 0.61 with [name] TBI of 0.38. Based on what I am seeing today and considering his arterial flow which is not very good I do believe that he likely needs to be seen by vascular soon as possible I think that is contributing to his poor healing in regard to this right leg. I discussed that with him today and the individual who I am assuming to be [name] family member that was present with him as well although I did not inquire in order to confirm. 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 He subsequently had an injury where he sustained [name] laceration this was on November 24, 2021. He then ended up with [name] need for reopening this with incision and drainage on 11-29-2021. I did look through the pictures that his family member had as well from the beginning through now and there has been some improvement though [name] lot of the granulation tissue is somewhat hypergranular there is also some evidence that the bone is drying out of the central portion of the wound. Unfortunately I do not think that this is healing nearly as well as I would like to see. I believe his blood flow is affecting this significantly and I think that needs to be addressed ASAP. Otherwise the patient's past medical history really has not changed significantly since the last time I saw him. That can be referenced above. 11/10; second visit for this wound on the right anterior upper tibia. As I understand things he initially had [name] fall while putting something in his attic. Initially the wound was sutured but became secondarily infected and had to be opened by general surgery culturing MSSA. He was followed by Dr. Steva Ready of infectious disease and is completed antibiotics [Keflex]. He  has completed this. Currently we are using Xeroform with backing Dakin's changing this daily. He has formal arterial studies ordered for vein and vascular next Wednesday apparently in response to [name] ABI of 0.6 Patient is asking about Medihoney which they apparently have in the [facility] clinic. I cannot pick up any claudication by history. 01-27-2022 upon evaluation today patient's wound is doing [name] little bit better and overall appearance fortunately there does not appear to be any signs of infection locally or systemically at this time which is great news and overall I am extremely pleased with where we stand. No fevers, chills, nausea, vomiting, or diarrhea. He did have his arterial study which I did review today. He is ABI on the right was 0.84 with [name] TBI of 0.97 on the left was 0.62 with [name] TBI of 0.75. This actually indicates fairly good blood flow all things considered. Fortunately I do not see any evidence of significant infection at this point as well which is also good news. 02-10-2022 upon evaluation today patient appears to be doing well currently in regard to his wound. He is actually been tolerating the dressing changes without complication. Fortunately I do not see any signs of infection which is great news. With that being said I see [name] lot of new granulation tissue and I am actually extremely  pleased with where we stand compared to last time I saw him I think that there is [name] lot of new growth over top of the bone area which is excellent news that is exactly what we wanted to see. I think there is probably only about 30% exposure of the bone compared to last time I saw him. 02-17-2022 upon evaluation today patient appears to be doing well currently in regard to his wound. He is slowly showing signs of covering over this bone which is good the tissue is [name] little hyper granulated but nonetheless does seem to be doing better than previous. 02-28-2022 upon evaluation today patient appears to be  doing excellent in regard to his wound. There are still some hypergranulation but to be honest this has dramatically improved compared to where it was previous. In fact the hypergranulation has been welcomed as this actually helped to cover the bone which was previously exposed there is just [name] tiny area is still exposed now and overall I think were very close to complete covering. Once we get that covered my plan is to switch to [facility]. 12/28; patient arrives in clinic today with no palpable bone in the right leg wound. He has [name] small divot superiorly where the bone was exposed we could not identify any exposed bone today. We are using collagen and Xeroform and Tubigrip. He does not have an arterial issue. He does arrive with very significant hypergranulation over the entirety of the wound surface perhaps 80% 01-22-2023 upon evaluation today patient appears to be doing well currently in regard to his leg ulcer which is actually showing signs of excellent improvement. Fortunately I do not see any evidence of infection at this time. He actually is making excellent progress. 03-31-2022 upon evaluation today patient appears to be doing well currently in regard to his wound in fact he appears to be completely healed pretty much across the board. Overall I am extremely pleased with where we stand I do think he is going to need to protect his legs however. This is good to be an ongoing issue for him and for that reason I do recommend likely [name] derma saver which I think would be very beneficial. Electronic Signature(s) Signed: 03/31/2022 10:42:22 AM By: Worthy Keeler PA-C Entered By: Worthy Keeler on 03/31/2022 10:42:22 -------------------------------------------------------------------------------- Physical Exam Details Patient Name: Date of Service: Theodore [name], Theodore [name]. 03/31/2022 9:15 [name] Medical Record Number: [id number] Patient Account Number: [id number] Date of Birth/Sex: Treating RN: Aug 07, 1929  (89 y.o. Theodore [name] Primary Care Provider: [name] Other Clinician: Referring Provider: Treating Provider/Extender: [name] in Treatment: [address] [place] ([id number]) [id number] Page 3 of 7 Constitutional Well-nourished and well-hydrated in no acute distress. Respiratory normal breathing without difficulty. Psychiatric this patient is able to make decisions and demonstrates good insight into disease process. Alert and Oriented x 3. pleasant and cooperative. Notes Upon inspection patient's wound bed actually showed signs of good granulation epithelization at this point. Fortunately there does not appear to be any evidence of active infection locally nor systemically which is great news and overall I am extremely pleased with where we stand today. Electronic Signature(s) Signed: 03/31/2022 10:42:40 AM By: Worthy Keeler PA-C Entered By: Worthy Keeler on 03/31/2022 10:42:40 -------------------------------------------------------------------------------- Physician Orders Details Patient Name: Date of Service: Theodore [name], Theodore [name]. 03/31/2022 9:15 [name] Medical Record Number: [id number] Patient Account Number: [id number] Date of Birth/Sex: Treating RN: 05-26-1929 (89 y.o. 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 Wound Treatment Wound #2  - Lower Leg Wound Laterality: Right, Midline, Anterior Peri-Wound Care: AandD Ointment Discharge Instructions: Apply AandD Ointment as directed Electronic Signature(s) Signed: 03/31/2022 1:44:04 PM By: Gretta Cool, BSN, RN, CWS, Kim RN, BSN Signed: 03/31/2022 2:37:27 PM By: Worthy Keeler PA-C Entered By: Gretta Cool, BSN, RN, CWS, Kim on 03/31/2022 09:55:21 Yepiz, [name] ([id number]) [id number] Page 4 of 7 -------------------------------------------------------------------------------- Problem List Details Patient Name: Date of Service: KHALIK, PEWITT 03/31/2022 9:15 [name] Medical Record Number: [id number] Patient Account Number: [id number] Date of Birth/Sex: Treating RN: 20-Jan-1930 (89 y.o. 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 03/31/2022 9:15 [name] Medical Record Number: [id number] Patient Account Number: [id number] Date of Birth/Sex: Treating RN: 09/03/1929 (89 y.o. Theodore [name] Primary Care Provider: [name] Other Clinician: Referring Provider: Treating Provider/Extender: [name] in  Treatment: [address], [place] ([id number]) [id number] Page 5 of 7 Subjective Chief Complaint Information obtained from Patient Right LE Ulcer and scalp abrasion History of Present Illness (HPI) ADMISSION 10/03/2021 This is [name] 89 year old man who was pulling [name] cart 3 months ago he had some sort of imbalance issue hitting his left anterior lower leg. He is not sure exactly what he hit. He is able to show me photos on his phone that show [name] laceration on the left anterior lower leg in an inverted L-shaped and. He initially did very little to this himself. Saw his primary doctor on 09/21/2021 [place] instructed him to put Vaseline gauze on this which he did for short period of time. He says that things are getting [name] lot smaller and are improving he has not really been treating this with anything. The other major problem the patient has is peripheral arterial disease. He is followed by vein and vascular and has had [name] series of follow-up arterial studies. Most recently this was done on 09/30/2021 showing on the left and ABI of 0.49 with [name] great toe pressure of 87 and monophasic waveforms. This is somewhat worse than test he had done on 03/21/2021 at which time the ABI was 0.68 TBI of 0.51. I think because of his age and the fact he has underlying stage IIIa chronic kidney disease and the fact the wound is healing they have not been scheduling him for anything aggressive but follow-up studies  are apparently booked for 38-month Past medical history includes coronary artery disease, lumbar radiculopathy, COPD which is limiting and stage IIIa chronic renal failure. He has PAD as noted. He also tells me that he had [name] surgical procedure for essential tremor which left him with some mild dysarthria and gait and balance problems 10-10-2021 upon evaluation today patient appears to be doing decently well in regard to his wound all things considered. He is very slow to heal due to his  low ABIs. With that being said it does appear that he is making some progress nonetheless there is little bit of dry skin and tissue around the edges of the wound I think [name] very light debridement to clear this away could be of benefit for him. The patient is in agreement with that plan. For that reason I did actually perform debridement to clear that away today but did so extremely carefully. 10-17-2021 upon evaluation today patient appears to be doing well currently in regard to his wound in fact this appears to be completely healed based on what I am seeing today. I do not see any evidence of active infection locally or systemically which is great news. No fevers, chills, nausea, vomiting, or diarrhea. Readmission: 01-13-2022 patient presents today for follow-up evaluation although the last time I saw him was actually August of this year. At that time he actually was doing decently well with regard to the wound on the left leg. Unfortunately however it was noted that he had noted poor ABIs and again I somehow missed documented the right side at the previous admission but his right ABI was 0.61 with [name] TBI of 0.38. Based on what I am seeing today and considering his arterial flow which is not very good I do believe that he likely needs to be seen by vascular soon as possible I think that is contributing to his poor healing in regard to this right leg. I discussed that with him today and the individual who I am assuming to be [name] family member that was present with him as well although I did not inquire in order to confirm. 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 He subsequently had an injury where he sustained [name] laceration this was on November 24, 2021. He then ended up with [name] need for reopening this with incision and drainage on 11-29-2021. I did look through the pictures that his family member had as well from the beginning through now and there has been some improvement  though [name] lot of the granulation tissue is somewhat hypergranular there is also some evidence that the bone is drying out of the central portion of the wound. Unfortunately I do not think that this is healing nearly as well as I would like to see. I believe his blood flow is affecting this significantly and I think that needs to be addressed ASAP. Otherwise the patient's past medical history really has not changed significantly since the last time I saw him. That can be referenced above. 11/10; second visit for this wound on the right anterior upper tibia. As I understand things he initially had [name] fall while putting something in his attic. Initially the wound was sutured but became secondarily infected and had to be opened by general surgery culturing MSSA. He was followed by Dr. RSteva Readyof infectious disease and is completed antibiotics [Keflex]. He has completed this. Currently we are using Xeroform with backing Dakin's changing this daily. He has formal arterial studies ordered  for vein and vascular next Wednesday apparently in response to [name] ABI of 0.6 Patient is asking about Medihoney which they apparently have in the [facility] clinic. I cannot pick up any claudication by history. 01-27-2022 upon evaluation today patient's wound is doing [name] little bit better and overall appearance fortunately there does not appear to be any signs of infection locally or systemically at this time which is great news and overall I am extremely pleased with where we stand. No fevers, chills, nausea, vomiting, or diarrhea. He did have his arterial study which I did review today. He is ABI on the right was 0.84 with [name] TBI of 0.97 on the left was 0.62 with [name] TBI of 0.75. This actually indicates fairly good blood flow all things considered. Fortunately I do not see any evidence of significant infection at this point as well which is also good news. 02-10-2022 upon evaluation today patient appears to be doing well  currently in regard to his wound. He is actually been tolerating the dressing changes without complication. Fortunately I do not see any signs of infection which is great news. With that being said I see [name] lot of new granulation tissue and I am actually extremely pleased with where we stand compared to last time I saw him I think that there is [name] lot of new growth over top of the bone area which is excellent news that is exactly what we wanted to see. I think there is probably only about 30% exposure of the bone compared to last time I saw him. 02-17-2022 upon evaluation today patient appears to be doing well currently in regard to his wound. He is slowly showing signs of covering over this bone which is good the tissue is [name] little hyper granulated but nonetheless does seem to be doing better than previous. 02-28-2022 upon evaluation today patient appears to be doing excellent in regard to his wound. There are still some hypergranulation but to be honest this has dramatically improved compared to where it was previous. In fact the hypergranulation has been welcomed as this actually helped to cover the bone which was previously exposed there is just [name] tiny area is still exposed now and overall I think were very close to complete covering. Once we get that covered my plan is to switch to [facility]. 12/28; patient arrives in clinic today with no palpable bone in the right leg wound. He has [name] small divot superiorly where the bone was exposed we could not identify any exposed bone today. We are using collagen and Xeroform and Tubigrip. He does not have an arterial issue. He does arrive with very significant hypergranulation over the entirety of the wound surface perhaps 80% 01-22-2023 upon evaluation today patient appears to be doing well currently in regard to his leg ulcer which is actually showing signs of excellent improvement. Fortunately I do not see any evidence of infection at this time. He  actually is making excellent progress. 03-31-2022 upon evaluation today patient appears to be doing well currently in regard to his wound in fact he appears to be completely healed pretty much across the board. Overall I am extremely pleased with where we stand I do think he is going to need to protect his legs however. This is good to be an ongoing issue for him and for that reason I do recommend likely [name] derma saver which I think would be very beneficial. Theodore [name], Theodore [name] ([id number]) [id number] Page 6 of 7 Objective Constitutional  Well-nourished and well-hydrated in no acute distress. Vitals Time Taken: 9:26 AM, Height: 75 in, Weight: 168 lbs, BMI: 21, Temperature: 97.8 F, Pulse: 74 bpm, Respiratory Rate: 16 breaths/min, Blood Pressure: 149/68 mmHg. Respiratory normal breathing without difficulty. Psychiatric this patient is able to make decisions and demonstrates good insight into disease process. Alert and Oriented x 3. pleasant and cooperative. General Notes: Upon inspection patient's wound bed actually showed signs of good granulation epithelization at this point. Fortunately there does not appear to be any evidence of active infection locally nor systemically which is great news and overall I am extremely pleased with where we stand today. Integumentary (Hair, Skin) Wound #2 status is Healed - Epithelialized. Original cause of wound was Laceration. The date acquired was: 11/24/2021. The wound has been in treatment 11 weeks. The wound is located on the Right,Midline,Anterior Lower Leg. The wound measures 0cm length x 0cm width x 0cm depth; 0cm^2 area and 0cm^3 volume. There is [name] medium amount of serosanguineous drainage noted. Wound #3 status is Healed - Epithelialized. Original cause of wound was Trauma. The date acquired was: 03/22/2022. The wound has been in treatment 1 weeks. The wound is located on the Forehead. The wound measures 0cm length x 0cm width x 0cm  depth; 0cm^2 area and 0cm^3 volume. There is [name] small amount of serosanguineous drainage noted. 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 WOUND #2: - Lower Leg Wound Laterality: Right, Midline, Anterior Peri-Wound Care: AandD Ointment Discharge Instructions: Apply AandD Ointment as directed 1. I am good recommend that the patient should continue to monitor for any signs of infection or worsening. Based on what I am seeing I do think that he is making excellent progress and everything appears to be completely healed. 2. I do think [name] derma saver for protection would be beneficial. His wife actually ordered that while they were here in the office and will be there on Sunday. In the meantime we did put just [name] protective dressing over top of this to hold until Sunday or Monday when they see the Jancie at [facility] at which point they can then switch over to the derma saver tissues and AandD ointment at that point. We will see patient back for reevaluation in 1 week here in the clinic. If anything worsens or changes patient will contact our office for additional recommendations. Electronic Signature(s) Signed: 03/31/2022 10:43:51 AM By: Worthy Keeler PA-C Entered By: Worthy Keeler on 03/31/2022 10:43:51 Theodore [name], Theodore [name] ([id number]) [id number] Page 7 of 7 -------------------------------------------------------------------------------- SuperBill Details Patient Name: Date of Service: Theodore [name], Theodore [name] 03/31/2022 Medical Record Number: [id number] Patient Account Number: [id number] Date of Birth/Sex: Treating  RN: January 19, 1930 (89 y.o. 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 S00.01XA Abrasion of scalp, initial encounter Facility Procedures : CPT4 Code: 37902409 Description: 99213 - WOUND CARE VISIT-LEV 3 EST PT Modifier: Quantity:  1 Physician Procedures : CPT4 Code Description Modifier [id number] - WC PHYS LEVEL 3 - EST PT ICD-10 Diagnosis Description L97.812 Non-pressure chronic ulcer of other part of right lower leg with fat layer exposed S80.811S Abrasion, right lower leg, sequela 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

## 2022-03-31 NOTE — Progress Notes (Signed)
Theodore [name], Theodore [name] ([id number]) [id number] Page 1 of 9 Visit Report for 03/31/2022 Arrival Information Details Patient Name: Date of Service: Theodore [name] 03/31/2022 9:15 [name] Medical Record Number: [id number] Patient Account Number: [id number] Date of Birth/Sex: Treating RN: 04/30/1929 (89 y.o. Theodore [name] Primary Care Danika Kluender: [name] Other Clinician: Referring Derel Mcglasson: Treating Socrates Cahoon/Extender: [name] in Treatment: 11 Visit Information History Since Last Visit Added or deleted any medications: No Patient Arrived: Walker Has Dressing in Place as Prescribed: Yes Arrival Time: 09:21 Has Compression in Place as Prescribed: Yes Accompanied By: wife Pain Present Now: No Transfer Assistance: None Patient Identification Verified: Yes Secondary Verification Process Completed: Yes Patient Requires Transmission-Based Precautions: No Patient Has Alerts: Yes Patient Alerts: ABI R .61 09/30/21 TBI .38 ABI L .49 09/30/21 TBI .47 Electronic Signature(s) Signed: 03/31/2022 1:44:04 PM By: Theodore [name], BSN, RN, CWS, Kim RN, BSN Entered By: Theodore [name], BSN, RN, CWS, Kim on 03/31/2022 09:26:02 -------------------------------------------------------------------------------- Clinic Level of Care Assessment Details Patient Name: Date of Service: Theodore [name], Theodore [name] 03/31/2022 9:15 [name] Medical Record Number: [id number] Patient Account Number: [id number] Date of Birth/Sex: Treating RN: December 17, 1929 (89 y.o. Theodore [name] Primary Care Zeola Brys: [name] Other Clinician: Referring Tzippy Testerman: Treating Keysi Oelkers/Extender: [name] in Treatment: 11 Clinic Level of Care Assessment Items TOOL 4 Quantity Score '[]'$  - 0 Use when only an EandM is performed on FOLLOW-UP visit ASSESSMENTS - Nursing Assessment / Reassessment X- 1 10 Reassessment of Co-morbidities (includes updates in patient status) X- 1 5 Reassessment of Adherence to  Treatment Plan ASSESSMENTS - Wound and Skin [name] X - Simple Wound Assessment / Reassessment - one wound 1 5 Theodore [name] [name] ([id number]) [id number] Page 2 of 9 '[]'$  - 0 Complex Wound Assessment / Reassessment - multiple wounds '[]'$  - 0 Dermatologic / Skin Assessment (not related to wound area) ASSESSMENTS - Focused Assessment '[]'$  - 0 Circumferential Edema Measurements - multi extremities '[]'$  - 0 Nutritional Assessment / Counseling / Intervention '[]'$  - 0 Lower Extremity Assessment (monofilament, tuning fork, pulses) '[]'$  - 0 Peripheral Arterial Disease Assessment (using hand held doppler) ASSESSMENTS - Ostomy and/or Continence Assessment and Care '[]'$  - 0 Incontinence Assessment and Management '[]'$  - 0 Ostomy Care Assessment and Management (repouching, etc.) PROCESS - Coordination of Care X - Simple Patient / Family Education for ongoing care 1 15 '[]'$  - 0 Complex (extensive) Patient / Family Education for ongoing care X- 1 10 Staff obtains Programmer, systems, Records, T Results / Process Orders est '[]'$  - 0 Staff telephones HHA, Nursing Homes / Clarify orders / etc '[]'$  - 0 Routine Transfer to another Facility (non-emergent condition) '[]'$  - 0 Routine Hospital Admission (non-emergent condition) '[]'$  - 0 New Admissions / Biomedical engineer / Ordering NPWT Apligraf, etc. , '[]'$  - 0 Emergency Hospital Admission (emergent condition) X- 1 10 Simple Discharge Coordination '[]'$  - 0 Complex (extensive) Discharge Coordination PROCESS - Special Needs '[]'$  - 0 Pediatric / Minor Patient Management '[]'$  - 0 Isolation Patient Management '[]'$  - 0 Hearing / Language / Visual special needs '[]'$  - 0 Assessment of Community assistance (transportation, D/C planning, etc.) '[]'$  - 0 Additional assistance / Altered mentation '[]'$  - 0 Support Surface(s) Assessment (bed, cushion, seat, etc.) INTERVENTIONS - Wound Cleansing / Measurement X - Simple Wound Cleansing - one wound 1  5 '[]'$  - 0 Complex Wound Cleansing - multiple wounds X- 1 5 Wound Imaging (photographs - any number of wounds) '[]'$  - 0 Wound  Tracing (instead of photographs) X- 1 5 Simple Wound Measurement - one wound '[]'$  - 0 Complex Wound Measurement - multiple wounds INTERVENTIONS - Wound Dressings X - Small Wound Dressing one or multiple wounds 1 10 '[]'$  - 0 Medium Wound Dressing one or multiple wounds '[]'$  - 0 Large Wound Dressing one or multiple wounds '[]'$  - 0 Application of Medications - topical '[]'$  - 0 Application of Medications - injection INTERVENTIONS - Miscellaneous '[]'$  - 0 External ear exam '[]'$  - 0 Specimen Collection (cultures, biopsies, blood, body fluids, etc.) '[]'$  - 0 Specimen(s) / Culture(s) sent or taken to Lab for analysis Theodore [name], Theodore [name] ([id number]) [id number] Page 3 of 9 '[]'$  - 0 Patient Transfer (multiple staff / Civil Service fast streamer / Similar devices) '[]'$  - 0 Simple Staple / Suture removal (25 or less) '[]'$  - 0 Complex Staple / Suture removal (26 or more) '[]'$  - 0 Hypo / Hyperglycemic Management (close monitor of Blood Glucose) '[]'$  - 0 Ankle / Brachial Index (ABI) - do not check if billed separately X- 1 5 Vital Signs Has the patient been seen at the hospital within the last three years: Yes Total Score: 85 Level Of Care: New/Established - Level 3 Electronic Signature(s) Signed: 03/31/2022 1:44:04 PM By: Theodore [name], BSN, RN, CWS, Kim RN, BSN Entered By: Theodore [name], BSN, RN, CWS, Kim on 03/31/2022 10:00:13 -------------------------------------------------------------------------------- Encounter Discharge Information Details Patient Name: Date of Service: Theodore [name], [name]. 03/31/2022 9:15 [name] Medical Record Number: [id number] Patient Account Number: [id number] Date of Birth/Sex: Treating RN: 1929/12/26 (89 y.o. Theodore [name] Primary Care Comfort Iversen: [name] Other Clinician: Referring Quandarius Nill: Treating Kailon Treese/Extender: [name] in Treatment:  11 Encounter Discharge Information Items Discharge Condition: Stable Ambulatory Status: Walker Discharge Destination: Home Transportation: Private Auto Accompanied By: wife Schedule Follow-up Appointment: No Clinical Summary of Care: Electronic Signature(s) Signed: 03/31/2022 1:44:04 PM By: Theodore [name], BSN, RN, CWS, Kim RN, BSN Entered By: Theodore [name], BSN, RN, CWS, Kim on 03/31/2022 10:01:31 -------------------------------------------------------------------------------- Lower Extremity Assessment Details Patient Name: Date of Service: Theodore [name], Theodore [name] 03/31/2022 9:15 [name] Medical Record Number: [id number] Patient Account Number: [id number] Date of Birth/Sex: Treating RN: [birth year] (89 y.o. Theodore [name] Primary Care Tuwanna Krausz: [name] Other Clinician: MEREDITH, Theodore [name] ([id number]) [id number] Page 4 of 9 Referring Darrion Macaulay: Treating Elena Cothern/Extender: [name] in Treatment: 11 Vascular Assessment Pulses: Dorsalis Pedis Palpable: [Right:Yes] Electronic Signature(s) Signed: 03/31/2022 1:44:04 PM By: Theodore [name], BSN, RN, CWS, Kim RN, BSN Entered By: Theodore [name], BSN, RN, CWS, Kim on 03/31/2022 09:35:33 -------------------------------------------------------------------------------- Multi Wound Chart Details Patient Name: Date of Service: Theodore [name] 03/31/2022 9:15 [name] Medical Record Number: [id number] Patient Account Number: [id number] Date of Birth/Sex: Treating RN: [birth year] (89 y.o. Theodore [name] Primary Care Arkeem Harts: [name] Other Clinician: Referring Jacki Couse: Treating Paxson Harrower/Extender: [name] Weeks in Treatment: 11 Vital Signs Height(in): 75 Pulse(bpm): 74 Weight(lbs): 168 Blood Pressure(mmHg): 149/68 Body Mass Index(BMI): 21 Temperature(F): 97.8 Respiratory Rate(breaths/min): 16 [2:Photos:] [N/[name]:N/[name]] Right, Midline, Anterior Lower Leg Forehead N/[name] Wound Location: Laceration Trauma N/[name] Wounding  Event: Skin Tear Abrasion N/[name] Primary Etiology: Trauma, Other N/[name] N/[name] Secondary Etiology: 11/24/2021 03/22/2022 N/[name] Date Acquired: 11 1 N/[name] Weeks of Treatment: Healed - Epithelialized Healed - Epithelialized N/[name] Wound Status: No No N/[name] Wound Recurrence: 0x0x0 0x0x0 N/[name] Measurements L x W x D (cm) 0 0 N/[name] [name] (cm) : rea 0 0 N/[name] Volume (cm) : 100.00% 100.00% N/[name] % Reduction in [name] rea: 100.00% 100.00% N/[name] % Reduction  in Volume: Full Thickness With Exposed Support Partial Thickness N/[name] Classification: Structures Medium Small N/[name] Exudate Amount: Serosanguineous Serosanguineous N/[name] Exudate Type: red, brown red, brown N/[name] Exudate Color: Treatment Notes Theodore [name], KEIRSEY [name] ([id number]) [id number] Page 5 of 9 Wound #2 (Lower Leg) Wound Laterality: Right, Midline, Anterior Cleanser Peri-Wound Care AandD Ointment Discharge Instruction: Apply AandD Ointment as directed Topical Primary Dressing Secondary Dressing Secured With Compression Wrap Compression Stockings Add-Ons Wound #3 (Forehead) Cleanser Peri-Wound Care Topical Primary Dressing Secondary Dressing Secured With Compression Wrap Compression Stockings Add-Ons Electronic Signature(s) Signed: 03/31/2022 1:44:04 PM By: Theodore [name], BSN, RN, CWS, Kim RN, BSN Entered By: Theodore [name], BSN, RN, CWS, Kim on 03/31/2022 10:01:55 -------------------------------------------------------------------------------- Multi-Disciplinary Care Plan Details Patient Name: Date of Service: Theodore [name], Theodore [name] 03/31/2022 9:15 [name] Medical Record Number: [id number] Patient Account Number: [id number] Date of Birth/Sex: Treating RN: 1929-04-27 (89 y.o. Theodore [name] Primary Care Tnya Ades: [name] Other Clinician: Referring Ladena Jacquez: Treating Brenin Heidelberger/Extender: [name] in Treatment: 11 Active Inactive Electronic Signature(s) Signed: 03/31/2022 1:44:04 PM By: Theodore [name], BSN, RN, CWS, Kim RN, BSN Entered  By: Theodore [name], BSN, RN, CWS, Kim on 03/31/2022 10:01:00 Theodore [name] ([id number]) [id number] Page 6 of 9 -------------------------------------------------------------------------------- Pain Assessment Details Patient Name: Date of Service: Theodore [name], Theodore [name] 03/31/2022 9:15 [name] Medical Record Number: [id number] Patient Account Number: [id number] Date of Birth/Sex: Treating RN: 1929-10-19 (89 y.o. Theodore [name] Primary Care Consuella Scurlock: [name] Other Clinician: Referring Marelin Tat: Treating Adolphe Fortunato/Extender: [name] in Treatment: 11 Active Problems Location of Pain Severity and Description of Pain Patient Has Paino No Site Locations Pain Management and Medication Current Pain Management: Electronic Signature(s) Signed: 03/31/2022 1:44:04 PM By: Theodore [name], BSN, RN, CWS, Kim RN, BSN Entered By: Theodore [name], BSN, RN, CWS, Kim on 03/31/2022 09:26:45 -------------------------------------------------------------------------------- Patient/Caregiver Education Details Patient Name: Date of Service: Theodore [name] 1/19/2024andnbsp9:15 [place] Record Number: [id number] Patient Account Number: [id number] Date of Birth/Gender: Treating RN: 11-01-1929 (89 y.o. Theodore [name] Primary Care Physician: [name] Other Clinician: Referring Physician: Treating Physician/Extender: [name] in Treatment: [address] Qasim [name] ([id number]) [id number] Page 7 of 9 Education Assessment Education Provided To: Patient Education Topics Provided Notes Protect legs with Dermasavers. Electronic Signature(s) Signed: 03/31/2022 1:44:04 PM By: Theodore [name], BSN, RN, CWS, Kim RN, BSN Entered By: Theodore [name], BSN, RN, CWS, Kim on 03/31/2022 10:00:43 -------------------------------------------------------------------------------- Wound Assessment Details Patient Name: Date of Service: Theodore [name], [name]. 03/31/2022 9:15 [name] Medical  Record Number: [id number] Patient Account Number: [id number] Date of Birth/Sex: Treating RN: 09-01-1929 (89 y.o. Theodore [name] Primary Care Eldred Sooy: [name] Other Clinician: Referring [name] Bonenberger: Treating Synia Douglass/Extender: [name] Weeks in Treatment: 11 Wound Status Wound Number: 2 Primary Etiology: Skin Tear Wound Location: Right, Midline, Anterior Lower Leg Secondary Etiology: Trauma, Other Wounding Event: Laceration Wound Status: Healed - Epithelialized Date Acquired: 11/24/2021 Weeks Of Treatment: 11 Clustered Wound: No Photos Photo Uploaded By: Theodore [name], BSN, RN, CWS, Kim on 03/31/2022 09:44:43 Wound Measurements Length: (cm) Width: (cm) Depth: (cm) Area: (cm) Volume: (cm) 0 % Reduction in Area: 100% 0 % Reduction in Volume: 100% 0 0 0 Wound Description Classification: Full Thickness With Exposed Support Structures Exudate Amount: Medium Exudate Type: Serosanguineous Exudate Color: red, brown Furgason, Benjamyn [name] ([id number]) [id number] Page 8 of 9 Treatment Notes Wound #2 (Lower Leg) Wound Laterality: Right, Midline, Anterior Cleanser Peri-Wound Care AandD Ointment Discharge Instruction: Apply AandD Ointment as directed Topical Primary Dressing Secondary Dressing Secured  With Compression Wrap Compression Stockings Add-Ons Electronic Signature(s) Signed: 03/31/2022 1:44:04 PM By: Theodore [name], BSN, RN, CWS, Kim RN, BSN Entered By: Theodore [name], BSN, RN, CWS, Kim on 03/31/2022 10:01:41 -------------------------------------------------------------------------------- Wound Assessment Details Patient Name: Date of Service: Theodore [name], [name]. 03/31/2022 9:15 [name] Medical Record Number: [id number] Patient Account Number: [id number] Date of Birth/Sex: Treating RN: 1929/08/05 (89 y.o. Theodore [name] Primary Care Jin Shockley: [name] Other Clinician: Referring Jetta Murray: Treating Glenda Kunst/Extender: [name] Weeks in  Treatment: 11 Wound Status Wound Number: 3 Primary Etiology: Abrasion Wound Location: Forehead Wound Status: Healed - Epithelialized Wounding Event: Trauma Date Acquired: 03/22/2022 Weeks Of Treatment: 1 Clustered Wound: No Photos Photo Uploaded By: Theodore [name], BSN, RN, CWS, Kim on 03/31/2022 09:44:44 Wound Measurements Length: (cm) Homes, Curly [name] ([id number]) Width: (cm) Depth: (cm) Area: (cm) Volume: (cm) 0 % Reduction in Area: 100% [id number] Page 9 of 9 0 % Reduction in Volume: 100% 0 0 0 Wound Description Classification: Partial Thickness Exudate Amount: Small Exudate Type: Serosanguineous Exudate Color: red, brown Treatment Notes Wound #3 (Forehead) Cleanser Peri-Wound Care Topical Primary Dressing Secondary Dressing Secured With Compression Wrap Compression Stockings Add-Ons Electronic Signature(s) Signed: 03/31/2022 1:44:04 PM By: Theodore [name], BSN, RN, CWS, Kim RN, BSN Entered By: Theodore [name], BSN, RN, CWS, Kim on 03/31/2022 09:34:45 -------------------------------------------------------------------------------- Vitals Details Patient Name: Date of Service: Ledell Noss [name]. 03/31/2022 9:15 [name] Medical Record Number: [id number] Patient Account Number: [id number] Date of Birth/Sex: Treating RN: [birth year] (89 y.o. Theodore [name] Primary Care Marieke Lubke: [name] Other Clinician: Referring Mikias Lanz: Treating Oneta Sigman/Extender: [name] Weeks in Treatment: 11 Vital Signs Time Taken: 09:26 Temperature (F): 97.8 Height (in): 75 Pulse (bpm): 74 Weight (lbs): 168 Respiratory Rate (breaths/min): 16 Body Mass Index (BMI): 21 Blood Pressure (mmHg): 149/68 Reference Range: 80 - 120 mg / dl Electronic Signature(s) Signed: 03/31/2022 1:44:04 PM By: Theodore [name], BSN, RN, CWS, Kim RN, BSN Entered By: Theodore [name], BSN, RN, CWS, Kim on 03/31/2022 09:26:40

## 2022-04-04 ENCOUNTER — Encounter: Payer: PPO | Admitting: Speech Pathology

## 2022-04-04 ENCOUNTER — Encounter: Payer: Self-pay | Admitting: Internal Medicine

## 2022-04-04 ENCOUNTER — Ambulatory Visit (INDEPENDENT_AMBULATORY_CARE_PROVIDER_SITE_OTHER): Payer: PPO | Admitting: Internal Medicine

## 2022-04-04 ENCOUNTER — Ambulatory Visit: Payer: PPO | Admitting: Speech Pathology

## 2022-04-04 VITALS — BP 142/66 | HR 74 | Ht 74.0 in | Wt 168.6 lb

## 2022-04-04 DIAGNOSIS — E038 Other specified hypothyroidism: Secondary | ICD-10-CM | POA: Diagnosis not present

## 2022-04-04 DIAGNOSIS — E785 Hyperlipidemia, unspecified: Secondary | ICD-10-CM

## 2022-04-04 DIAGNOSIS — R471 Dysarthria and anarthria: Secondary | ICD-10-CM | POA: Diagnosis not present

## 2022-04-04 DIAGNOSIS — F0283 Dementia in other diseases classified elsewhere, unspecified severity, with mood disturbance: Secondary | ICD-10-CM

## 2022-04-04 DIAGNOSIS — I700 Atherosclerosis of aorta: Secondary | ICD-10-CM

## 2022-04-04 DIAGNOSIS — E440 Moderate protein-calorie malnutrition: Secondary | ICD-10-CM

## 2022-04-04 DIAGNOSIS — G309 Alzheimer's disease, unspecified: Secondary | ICD-10-CM

## 2022-04-04 MED ORDER — ROSUVASTATIN CALCIUM 10 MG PO TABS: 10.0000 mg | ORAL_TABLET | ORAL | 0 refills | Status: DC

## 2022-04-04 MED ORDER — PROPRANOLOL HCL 10 MG PO TABS: 10.0000 mg | ORAL_TABLET | Freq: Three times a day (TID) | ORAL | 0 refills | Status: DC

## 2022-04-04 NOTE — Assessment & Plan Note (Signed)
With uncontrolled anxiety.  Wellbutrin stopped,  paxil dose increased to 20 mg daily starting today .  Joella Prince based counselling given

## 2022-04-04 NOTE — Assessment & Plan Note (Signed)
Decreased appetite secondary to severe anxiety addressed I have reviewed his diet and recommended that he increase her protein and fat intake while monitoring her carbohydrates.

## 2022-04-04 NOTE — Patient Instructions (Addendum)
Please Increase the dose of paxil to 20 mg daily   Go for a walk every day,  30 minutes max.  (Start with 10 minutes and work your way up )  Use the propranolol 30 minutes before performances.  You can start with 1/2 tablet  If you wake up in a panic,  pick up Psalms and start with Psalm 91.  If you are still awake after 20 minutes of reading, you may take 1/2 alpazolam    "Be anxious for nothing,  But in everything , by prayer and supplication , let your request by made known to God. And the peace of God which surpasses all understanding, shall guard your hearts and minds through [employer]."

## 2022-04-04 NOTE — Assessment & Plan Note (Signed)
Thyroid function is WNL on current dose.  No current changes needed.    Lab Results  Component Value Date   TSH 0.97 08/26/2021

## 2022-04-04 NOTE — Assessment & Plan Note (Signed)
Reviewed findings of prior CT scan today..  Patient is tolerating rosuvastatin every other day,  and zetia daily

## 2022-04-04 NOTE — Progress Notes (Signed)
Subjective:  Patient ID: Theodore [name], male    DOB: 09-19-1929  Age: 89 y.o. MRN: [id number]  CC: The primary encounter diagnosis was Thoracic aortic atherosclerosis ([place]). 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 And rn alprazolam pone week ago . Reports  feeling less tense, but woke up in a panic attack last night. At 3 am.  Cites worrying about tax preparation as the cause.  However his wife Theodore [name] states that regarding the tax issues  they had a very good meeting with the accountant .  She has given him alprazolam one time,  during the day,  using  1/2 tablet which he tolerated  well.   He continues to appear extremely anxious and has a pained expression on his face.   2) WOUND on leg has completely healed .  [facility] Center has released him and rx'd bilateral shin protectors  3) his ET has returned.  He is worried about his harmonica playing as he has accepted several playing engagements.     Outpatient Medications Prior to Visit  Medication Sig Dispense Refill   albuterol (PROVENTIL) (2.5 MG/3ML) 0.083% nebulizer solution Take 3 mLs (2.5 mg total) by nebulization every 6 (six) hours as needed for wheezing or shortness of breath. 60 mL 0   ALPRAZolam (XANAX) 0.25 MG tablet Take 1 tablet (0.25 mg total) by mouth 2 (two) times daily as needed for anxiety or sleep. 60 tablet 2   ascorbic acid (VITAMIN C) 500 MG tablet Take 500 mg by mouth daily.     budesonide (PULMICORT) 0.5 MG/2ML nebulizer solution Take 2 mLs (0.5 mg total) by nebulization in the morning and at bedtime for 7 days. 28 mL 0   Cholecalciferol  (VITAMIN D3) 50 MCG (2000 UT) TABS Take 1 tablet by mouth daily.     cycloSPORINE (RESTASIS) 0.05 % ophthalmic emulsion Place 1 drop into both eyes 2 (two) times daily.     ezetimibe (ZETIA) 10 MG tablet TAKE 1 TABLET BY MOUTH EVERY DAY 60 tablet 0   fluticasone (FLONASE) 50 MCG/ACT nasal spray SPRAY 2 SPRAYS INTO EACH NOSTRIL EVERY DAY 48 mL 1   levothyroxine (SYNTHROID) 100 MCG tablet TAKE 1 TABLET BY MOUTH EVERY DAY BEFORE BREAKFAST 90 tablet 0   magnesium oxide (MAG-OX) 400 MG tablet Take 400 mg by mouth daily.     PARoxetine (PAXIL) 10 MG tablet Take 1 tablet (10 mg total) by mouth daily. 90 tablet 1   Saccharomyces boulardii (PROBIOTIC) 250 MG CAPS Take 1 capsule by mouth daily at 12 noon.     testosterone cypionate (DEPOTESTOSTERONE CYPIONATE) 200 MG/ML injection INJECT 0.5 ML (100 MG TOTAL) INTO THE MUSCLE EVERY 14 DAYS. 1 mL 2   vitamin B-12 (CYANOCOBALAMIN) 500 MCG tablet Take 1 tablet by mouth daily.     rosuvastatin (CRESTOR) 10 MG tablet Take 1 tablet (10 mg total) by mouth daily. (Patient taking differently: Take 10 mg by mouth every other day.) 90 tablet 1   donepezil (ARICEPT) 10 MG tablet Take 10 mg by mouth every morning.     No facility-administered medications prior to visit.  Review of Systems;  Patient denies headache, fevers, malaise, unintentional weight loss, skin rash, eye pain, sinus congestion and sinus pain, sore throat, dysphagia,  hemoptysis , cough, dyspnea, wheezing, chest pain, palpitations, orthopnea, edema, abdominal pain, nausea, melena, diarrhea, constipation, flank pain, dysuria, hematuria, urinary  Frequency, nocturia, numbness, tingling, seizures,  Focal weakness, Loss of consciousness,  Tremor, insomnia, depression, anxiety, and suicidal ideation.      Objective:  BP (!) 142/66   Pulse 74   Ht '6\' 2"'$  (1.88 m)   Wt 168 lb 9.6 oz (76.5 kg)   SpO2 94%   BMI 21.65 kg/m   BP Readings from Last 3 Encounters:  04/04/22 (!) 142/66  03/28/22 (!)  158/60  03/08/22 (!) 158/77    Wt Readings from Last 3 Encounters:  04/04/22 168 lb 9.6 oz (76.5 kg)  03/28/22 172 lb 12.8 oz (78.4 kg)  03/10/22 168 lb (76.2 kg)    Physical Exam Vitals reviewed.  Constitutional:      General: He is not in acute distress.    Appearance: Normal appearance. He is normal weight. He is not ill-appearing, toxic-appearing or diaphoretic.  HENT:     Head: Normocephalic.  Eyes:     General: No scleral icterus.       Right eye: No discharge.        Left eye: No discharge.     Conjunctiva/sclera: Conjunctivae normal.  Cardiovascular:     Rate and Rhythm: Normal rate and regular rhythm.     Heart sounds: Normal heart sounds.  Pulmonary:     Effort: Pulmonary effort is normal. No respiratory distress.     Breath sounds: Normal breath sounds.  Musculoskeletal:        General: Normal range of motion.     Cervical back: Normal range of motion.  Skin:    General: Skin is warm and dry.  Neurological:     General: No focal deficit present.     Mental Status: He is alert and oriented to person, place, and time. Mental status is at baseline.  Psychiatric:        Mood and Affect: Mood is anxious.        Behavior: Behavior normal.        Thought Content: Thought content normal.        Judgment: Judgment normal.     Comments: Extremely anxious,  tremulous    Lab Results  Component Value Date   HGBA1C 4.8 01/17/2021   HGBA1C 4.7 05/28/2018    Lab Results  Component Value Date   CREATININE 1.19 01/23/2022   CREATININE 1.41 01/04/2022   CREATININE 1.14 12/05/2021    Lab Results  Component Value Date   WBC 6.6 01/04/2022   HGB 12.9 (L) 01/04/2022   HCT 38.5 (L) 01/04/2022   PLT 275.0 01/04/2022   GLUCOSE 106 (H) 01/23/2022   CHOL 197 09/21/2020   TRIG 70.0 09/21/2020   HDL 62.00 09/21/2020   LDLCALC 121 (H) 09/21/2020   ALT 17 01/04/2022   AST 19 01/04/2022   NA 137 01/23/2022   K 4.1 01/23/2022   CL 103 01/23/2022   CREATININE 1.19  01/23/2022   BUN 28 (H) 01/23/2022   CO2 28 01/23/2022   TSH 0.97 08/26/2021   PSA 21.68 (H) 02/11/2021   INR 1.4 (H) 11/29/2021   HGBA1C 4.8 01/17/2021    No results found.  Assessment & Plan:  .Thoracic aortic atherosclerosis [facility]) Assessment & Plan: Reviewed findings of prior CT scan today.[name]  Patient is tolerating rosuvastatin every other day,  and zetia daily    Dysarthria Assessment & Plan: His dysarthria worsened after his last elective procedure for treatment of BET which was impeding  his ability to play his harmonica.  He is receiving speech therapy    Protein-calorie malnutrition, moderate (HCC) Assessment & Plan:  Decreased appetite secondary to severe anxiety addressed I have reviewed his diet and recommended that he increase her protein and fat intake while monitoring her carbohydrates.    Orders: -     Comprehensive metabolic panel; Future  Other specified hypothyroidism Assessment & Plan: Thyroid function is WNL on current dose.  No current changes needed.    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  Wellbutrin stopped,  paxil dose increased to 20 mg daily starting today .  Joella Prince based counselling given    Other orders -     Rosuvastatin Calcium; Take 1 tablet (10 mg total) by mouth every other day.  Dispense: 45 tablet; Refill: 0 -     Propranolol HCl; Take 1 tablet (10 mg total) by mouth 3 (three) times daily. As needed for tremor and stage fright  Dispense: 30 tablet; Refill: 0     I provided 30 minutes of face-to-face time during this encounter reviewing patient's last visit with me, pcounseling on currently addressed issues,  and post visit ordering to diagnostics and therapeutics .   Follow-up: Return in about 4 weeks (around 05/02/2022) for  anxiety.   [name], MD

## 2022-04-04 NOTE — Assessment & Plan Note (Addendum)
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  He is receiving speech therapy

## 2022-04-06 ENCOUNTER — Encounter: Payer: PPO | Admitting: Speech Pathology

## 2022-04-06 ENCOUNTER — Ambulatory Visit: Payer: PPO | Admitting: Speech Pathology

## 2022-04-10 ENCOUNTER — Encounter: Payer: PPO | Admitting: Speech Pathology

## 2022-04-11 ENCOUNTER — Ambulatory Visit: Payer: PPO | Admitting: Speech Pathology

## 2022-04-11 ENCOUNTER — Other Ambulatory Visit (INDEPENDENT_AMBULATORY_CARE_PROVIDER_SITE_OTHER): Payer: Self-pay | Admitting: Nurse Practitioner

## 2022-04-11 DIAGNOSIS — R41841 Cognitive communication deficit: Secondary | ICD-10-CM

## 2022-04-11 DIAGNOSIS — G309 Alzheimer's disease, unspecified: Secondary | ICD-10-CM

## 2022-04-11 DIAGNOSIS — I739 Peripheral vascular disease, unspecified: Secondary | ICD-10-CM

## 2022-04-11 NOTE — Therapy (Signed)
OUTPATIENT SPEECH LANGUAGE PATHOLOGY TREATMENT NOTE   Patient Name: Theodore [name] MRN: [id number] DOB:September 25, 1929, 89 y.o., male Today's Date: 04/11/2022   PCP: [name], MD REFERRING PROVIDER: [name], MD  END OF SESSION:    End of Session - 04/11/22 1626     Visit Number 7    Number of Visits 25    Date for SLP Re-Evaluation 05/17/22    Authorization Type [employer] P[employer]    Progress Note Due on Visit 10    SLP Start Time 1400    SLP Stop Time  1500    SLP Time Calculation (min) 60 min    Activity Tolerance Patient tolerated treatment well                     Past Medical History:  Diagnosis Date   Actinic keratosis    Arthritis    knees   Asymptomatic Sinus bradycardia    a. 05/2016 Zio: Avg HR 61 (41-167).   Benign essential tremor    Cancer ([place]) 2002   melanoma right ear   Colon polyp    COPD (chronic obstructive pulmonary disease) ([place])    pt said he believes it is a misdiagnosis    Coronary artery calcification seen on CT scan    a. 06/2016 CTA Chest: cor Ca2+; b. 05/2017 MV: EF >65%. No ischemia/infarct.   Depression    History of echocardiogram    a. 04/2016 Echo: EF 60-65%, mild conc LVH. Nl PASP.   History of kidney stones    Hx of dysplastic nevus 05/07/2017   L anterior shoulder, severe   Hx of dysplastic nevus 09/06/2020   R lower sternum, moderate atypia   Hypothyroidism    Hypothyroidism    Laceration of right lower extremity    Melanoma ([place]) 1990's per pt   R ear   Melanoma in situ ([place]) 01/12/2014   left jaw   MSSA infection, non-invasive    Palpitations    a. 05/2016 Zio: Avg HR 61 (41-167). 9 SVT runs (fastest 167 - 5 beats; longest 8 beats - 101 bpm). Rare PACs/PVCs.   PVC's (premature ventricular contractions)    Rhinitis    Sepsis due to cellulitis ([place]) 11/28/2021   Severe sepsis ([place]) 11/28/2021   Wears dentures    partial upper and lower   Wears hearing aid in both ears    Past Surgical History:   Procedure Laterality Date   ADENOIDECTOMY     CATARACT EXTRACTION W/PHACO Left 10/22/2017   Procedure: CATARACT EXTRACTION PHACO AND INTRAOCULAR LENS PLACEMENT ([place])  LEFT;  Surgeon: Eulogio Bear, MD;  Location: [place];  Service: Ophthalmology;  Laterality: Left;   CATARACT EXTRACTION W/PHACO Right 11/20/2017   Procedure: CATARACT EXTRACTION PHACO AND INTRAOCULAR LENS PLACEMENT (IOC) RIGHT;  Surgeon: Eulogio Bear, MD;  Location: [place];  Service: Ophthalmology;  Laterality: Right;   ESOPHAGOGASTRODUODENOSCOPY (EGD) WITH PROPOFOL N/A 10/31/2016   Procedure: ESOPHAGOGASTRODUODENOSCOPY (EGD) WITH PROPOFOL;  Surgeon: Lollie Sails, MD;  Location: [facility] ENDOSCOPY;  Service: Endoscopy;  Laterality: N/A;   I & D EXTREMITY Right 11/29/2021   Procedure: IRRIGATION AND DEBRIDEMENT EXTREMITY;  Surgeon: Olean Ree, MD;  Location: [facility] ORS;  Service: General;  Laterality: Right;   JOINT REPLACEMENT Right 2003   knee   knee meniscus repair Right    MELANOMA EXCISION Right    ear. Followed by Dr. 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 ([place]) 11/28/2021   Dyslipidemia 11/28/2021   GERD without esophagitis 11/28/2021   Proximal muscle weakness 08/27/2021   Speech disturbance 08/26/2021   Adverse drug reaction, initial encounter 05/04/2021   Lumbar radiculopathy 04/25/2021   Thoracic aortic atherosclerosis ([place]) 01/17/2021   Dizziness 12/31/2020   Anxiety in acute stress reaction 12/07/2020   Mild cognitive impairment with memory loss 11/30/2020   Elevated PSA, between 10 and less than 20 ng/ml 11/30/2020   Chronic kidney disease, stage 3b ([place]) 11/10/2020   Benign hypertensive kidney disease with chronic kidney disease 11/10/2020   COVID-19 virus infection  04/06/2020   Acquired trigger finger of left middle finger 08/07/2019   Carpal tunnel syndrome of right wrist 07/10/2019   Ulnar neuropathy 06/05/2019   Disorder of bursae of shoulder region 01/29/2019   Localized, primary osteoarthritis of shoulder region 01/29/2019   Localized, secondary osteoarthritis of the shoulder region 01/29/2019   Osteoarthritis of knee 01/29/2019   Shoulder joint pain 01/29/2019   Chronic obstructive pulmonary disease ([place]) 09/24/2018   Grief 09/02/2018   Cervical radiculopathy 08/14/2018   PVC (premature ventricular contraction) 03/21/2018   PAD (peripheral artery disease) (HCC)    Anterior knee pain, left 12/26/2015   Hypothyroidism 07/04/2014   Nonallergic vasomotor rhinitis 07/01/2014   Hypertrophy of prostate with urinary obstruction and other lower urinary tract symptoms (LUTS) 08/08/2013   Low serum testosterone level 04/28/2013   Actinic keratosis 05/10/2010   History of malignant melanoma of skin 05/10/2010    ONSET DATE: date of referrals 02/01/2022 and 02/22/2022    REFERRING DIAG: R47.1 (ICD-10-CM) - Dysarthria R41.89 (ICD-10-CM) - Cognitive decline     PERTINENT HISTORY: Pt with recent course of Outpatient ST services same deficits (08/26/2021 and 09/07/2021).  [name]      07/21/2019 - pt underwent MRI guided focused ultrasound thalamotomy LEFT sided for treatment of essential tremor of RIGHT arm    07/30/2021 - pt underwent MRI guided focused ultrasound thalamotomy RIGHT sided for treatment of essential tremor of LEFT arm        DIAGNOSTIC FINDINGS:    MRi 07/05/2021 consistent with recent MR guided focus ultrasound ablation in the right subthalamic region A focus of diffusion restriction right subthalamic region measures 9 x 6 mm     MBSS 2017    MBSS 11/04/2021 Pt presents with overall adequate oropharyngeal abilities when consuming puree, soft solids, thin liquids via spoon and cup as well as whole barium tablet with thin liquids.  Pt's swallow function doesn't appear any different than documented in his previous MBSS (2017) which stated, "Thin liquid- small amount of laryngeal penetration with rapid successive swallowing maneuvers. With single swallowing maneuvers no laryngeal penetration is observed. As well in this previous study, pt continues to demonstrate penetration with trace aspiration when consuming successive swallows. Pt demonstrated understanding need to take single sips as he stated that he doesn't consume consecutive sips at baseline. At this time, pt presents with adequate oropharyngeal abilities and reduced risk of aspiration when consuming regular diet with thin liquids via single cup sips and medicine whole with thin liquids. Skilled ST intervention is not indicated.   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 No  PATIENT GOALS: to get better  OBJECTIVE:   TODAY'S TREATMENT:  Skilled treatment session focused on pt's cognitive communication goals. SLP facilitated session by providing the following interventions:    Pt continues to struggle with loss of cognitive abilities and voices that he is "fearful of everything." With max to moderate assistance able to sequence basic tasks.   Given pt's continued cognitive decline, recommend that pt's wife begin managing possible renovation as well as projects such as buying a car.    PATIENT EDUCATION: Education details: see above Person educated: Patient and Spouse Education method: Consulting civil engineer, Media planner, Verbal cues, and Handouts Education comprehension: needs further education  HOME EXERCISE PROGRAM:  Recommend pt and his wife continue to do activities together, pt make his wife aware of information that he gathers from  conversation with accountant.   GOALS: Goals reviewed with patient? Yes  SHORT TERM GOALS: Target date: 10 sessions  Patient will demonstrate understanding of functional cognitive activities for home maintenance program with moderate assistance Baseline: Maximal Goal status: INITIAL   2.  Given moderate assistance, patient will demonstrate knowledge of appropriate activities to support cognitive and  language function outside of ST with assistance from family.  Baseline: Maximal Goal status: INITIAL   3.  Patient will report engagement in cognitive activities outside of ST 4/7 days.  Baseline:  Goal status: INITIAL   4.  Pt will verbalize and demonstrate how to implement 1 memory and attention strategies to aid daily functioning with moderate A over 2 sessions  Baseline: currently not using any strategies Goal status: INITIAL     LONG TERM GOALS: Target date: 05/17/2022 Patient will demonstrate understanding of functional cognitive activities for home maintenance program with minimal assistance Baseline: maximal Goal status: INITIAL  ASSESSMENT:  CLINICAL IMPRESSION: Pt continues to demonstrate fluctuating ability to access therapy activities d/t high anxiety.   OBJECTIVE IMPAIRMENTS include attention, memory, awareness, executive functioning, dysarthria, dysphagia, and motor speech . These impairments are limiting patient from managing medications, managing appointments, managing finances, household responsibilities, ADLs/IADLs, effectively communicating at home and in community, and safety when swallowing. Factors affecting potential to achieve goals and functional outcome are ability to learn/carryover information, co-morbidities, cooperation/participation level, medical prognosis, previous level of function, and severity of impairments. Patient will benefit from skilled SLP services to address above impairments and improve overall function.  REHAB POTENTIAL: Fair bilateral  thalamotomy  PLAN: SLP FREQUENCY: 1-2x/week  SLP DURATION: 8 weeks  PLANNED INTERVENTIONS: Aspiration precaution training, Environmental controls, Cueing hierachy, Cognitive reorganization, Internal/external aids, Functional tasks, SLP instruction and feedback, Compensatory strategies, and Patient/family education  Finola Rosal B. Rutherford Nail, M.S., CCC-SLP, Mining engineer Certified Brain Injury [place]  [place] Office [phone] Ascom [phone] Fax [phone]

## 2022-04-12 ENCOUNTER — Ambulatory Visit (INDEPENDENT_AMBULATORY_CARE_PROVIDER_SITE_OTHER): Payer: PPO

## 2022-04-12 ENCOUNTER — Other Ambulatory Visit: Payer: Self-pay | Admitting: Internal Medicine

## 2022-04-12 ENCOUNTER — Ambulatory Visit (INDEPENDENT_AMBULATORY_CARE_PROVIDER_SITE_OTHER): Payer: PPO | Admitting: Nurse Practitioner

## 2022-04-12 MED ORDER — PAROXETINE HCL 20 MG PO TABS: 20.0000 mg | ORAL_TABLET | Freq: Every day | ORAL | 5 refills | Status: DC

## 2022-04-12 NOTE — Progress Notes (Signed)
Telephone conversation with Theodore [name] after hours on Jan 31.  His anxiety reains uncontrolled and aggravated by his fear of change related to the upcoming remodel of their home at [facility].  There has been no improvement with alprazolam lowest dose

## 2022-04-13 ENCOUNTER — Encounter: Payer: PPO | Admitting: Speech Pathology

## 2022-04-17 ENCOUNTER — Encounter: Payer: PPO | Admitting: Speech Pathology

## 2022-04-17 DIAGNOSIS — R8290 Unspecified abnormal findings in urine: Secondary | ICD-10-CM | POA: Diagnosis not present

## 2022-04-17 DIAGNOSIS — N1831 Chronic kidney disease, stage 3a: Secondary | ICD-10-CM | POA: Diagnosis not present

## 2022-04-17 DIAGNOSIS — I10 Essential (primary) hypertension: Secondary | ICD-10-CM | POA: Diagnosis not present

## 2022-04-17 DIAGNOSIS — I129 Hypertensive chronic kidney disease with stage 1 through stage 4 chronic kidney disease, or unspecified chronic kidney disease: Secondary | ICD-10-CM | POA: Diagnosis not present

## 2022-04-17 DIAGNOSIS — R319 Hematuria, unspecified: Secondary | ICD-10-CM | POA: Diagnosis not present

## 2022-04-17 DIAGNOSIS — N1832 Chronic kidney disease, stage 3b: Secondary | ICD-10-CM | POA: Diagnosis not present

## 2022-04-18 ENCOUNTER — Ambulatory Visit: Payer: PPO | Attending: Internal Medicine | Admitting: Speech Pathology

## 2022-04-18 DIAGNOSIS — F0283 Dementia in other diseases classified elsewhere, unspecified severity, with mood disturbance: Secondary | ICD-10-CM | POA: Insufficient documentation

## 2022-04-18 DIAGNOSIS — R2681 Unsteadiness on feet: Secondary | ICD-10-CM | POA: Diagnosis not present

## 2022-04-18 DIAGNOSIS — R41841 Cognitive communication deficit: Secondary | ICD-10-CM | POA: Insufficient documentation

## 2022-04-18 DIAGNOSIS — G309 Alzheimer's disease, unspecified: Secondary | ICD-10-CM | POA: Insufficient documentation

## 2022-04-18 DIAGNOSIS — R29898 Other symptoms and signs involving the musculoskeletal system: Secondary | ICD-10-CM | POA: Diagnosis not present

## 2022-04-18 DIAGNOSIS — R2689 Other abnormalities of gait and mobility: Secondary | ICD-10-CM | POA: Diagnosis not present

## 2022-04-19 NOTE — Therapy (Signed)
OUTPATIENT SPEECH LANGUAGE PATHOLOGY TREATMENT NOTE   Patient Name: Theodore [name] MRN: [id number] DOB:[birth year], 89 y.o., male Today's Date: 04/18/2022   PCP: [name], MD REFERRING PROVIDER: [name], MD  END OF SESSION:    End of Session - 04/18/22 1626     Visit Number 8   Number of Visits 25    Date for SLP Re-Evaluation 05/17/22    Authorization Type [employer] P[employer]    Progress Note Due on Visit 10    SLP Start Time 1400    SLP Stop Time  1500    SLP Time Calculation (min) 60 min    Activity Tolerance Patient tolerated treatment well          Past Medical History:  Diagnosis Date   Actinic keratosis    Arthritis    knees   Asymptomatic Sinus bradycardia    a. 05/2016 Zio: Avg HR 61 (41-167).   Benign essential tremor    Cancer ([place]) 2002   melanoma right ear   Colon polyp    COPD (chronic obstructive pulmonary disease) ([place])    pt said he believes it is a misdiagnosis    Coronary artery calcification seen on CT scan    a. 06/2016 CTA Chest: cor Ca2+; b. 05/2017 MV: EF >65%. No ischemia/infarct.   Depression    History of echocardiogram    a. 04/2016 Echo: EF 60-65%, mild conc LVH. Nl PASP.   History of kidney stones    Hx of dysplastic nevus 05/07/2017   L anterior shoulder, severe   Hx of dysplastic nevus 09/06/2020   R lower sternum, moderate atypia   Hypothyroidism    Hypothyroidism    Laceration of right lower extremity    Melanoma ([place]) 1990's per pt   R ear   Melanoma in situ ([place]) 01/12/2014   left jaw   MSSA infection, non-invasive    Palpitations    a. 05/2016 Zio: Avg HR 61 (41-167). 9 SVT runs (fastest 167 - 5 beats; longest 8 beats - 101 bpm). Rare PACs/PVCs.   PVC's (premature ventricular contractions)    Rhinitis    Sepsis due to cellulitis ([place]) 11/28/2021   Severe sepsis ([place]) 11/28/2021   Wears dentures    partial upper and lower   Wears hearing aid in both ears    Past Surgical History:  Procedure Laterality  Date   ADENOIDECTOMY     CATARACT EXTRACTION W/PHACO Left 10/22/2017   Procedure: CATARACT EXTRACTION PHACO AND INTRAOCULAR LENS PLACEMENT ([place])  LEFT;  Surgeon: Eulogio Bear, MD;  Location: [place];  Service: Ophthalmology;  Laterality: Left;   CATARACT EXTRACTION W/PHACO Right 11/20/2017   Procedure: CATARACT EXTRACTION PHACO AND INTRAOCULAR LENS PLACEMENT (IOC) RIGHT;  Surgeon: Eulogio Bear, MD;  Location: [place];  Service: Ophthalmology;  Laterality: Right;   ESOPHAGOGASTRODUODENOSCOPY (EGD) WITH PROPOFOL N/A 10/31/2016   Procedure: ESOPHAGOGASTRODUODENOSCOPY (EGD) WITH PROPOFOL;  Surgeon: Lollie Sails, MD;  Location: [facility] ENDOSCOPY;  Service: Endoscopy;  Laterality: N/A;   I & D EXTREMITY Right 11/29/2021   Procedure: IRRIGATION AND DEBRIDEMENT EXTREMITY;  Surgeon: Olean Ree, MD;  Location: [facility] ORS;  Service: General;  Laterality: Right;   JOINT REPLACEMENT Right 2003   knee   knee meniscus repair Right    MELANOMA EXCISION Right    ear. Followed by Dr. Nehemiah Massed   PATELLECTOMY Bilateral    TONSILLECTOMY     Patient Active Problem List   Diagnosis Date Noted  Sialorrhea 02/02/2022   Protein-calorie malnutrition, moderate ([place]) 11/29/2021   Dementia in Alzheimer's disease with depression ([place]) 11/28/2021   Dyslipidemia 11/28/2021   GERD without esophagitis 11/28/2021   Proximal muscle weakness 08/27/2021   Speech disturbance 08/26/2021   Adverse drug reaction, initial encounter 05/04/2021   Lumbar radiculopathy 04/25/2021   Thoracic aortic atherosclerosis ([place]) 01/17/2021   Dizziness 12/31/2020   Anxiety in acute stress reaction 12/07/2020   Mild cognitive impairment with memory loss 11/30/2020   Elevated PSA, between 10 and less than 20 ng/ml 11/30/2020   Chronic kidney disease, stage 3b ([place]) 11/10/2020   Benign hypertensive kidney disease with chronic kidney disease 11/10/2020   COVID-19 virus infection 04/06/2020   Acquired  trigger finger of left middle finger 08/07/2019   Carpal tunnel syndrome of right wrist 07/10/2019   Ulnar neuropathy 06/05/2019   Disorder of bursae of shoulder region 01/29/2019   Localized, primary osteoarthritis of shoulder region 01/29/2019   Localized, secondary osteoarthritis of the shoulder region 01/29/2019   Osteoarthritis of knee 01/29/2019   Shoulder joint pain 01/29/2019   Chronic obstructive pulmonary disease ([place]) 09/24/2018   Grief 09/02/2018   Cervical radiculopathy 08/14/2018   PVC (premature ventricular contraction) 03/21/2018   PAD (peripheral artery disease) (H[facility])    Anterior knee pain, left 12/26/2015   Hypothyroidism 07/04/2014   Nonallergic vasomotor rhinitis 07/01/2014   Hypertrophy of prostate with urinary obstruction and other lower urinary tract symptoms (LUTS) 08/08/2013   Low serum testosterone level 04/28/2013   Actinic keratosis 05/10/2010   History of malignant melanoma of skin 05/10/2010    ONSET DATE: date of referrals 02/01/2022 and 02/22/2022    REFERRING DIAG: R47.1 (ICD-10-CM) - Dysarthria R41.89 (ICD-10-CM) - Cognitive decline     PERTINENT HISTORY: Pt with recent course of Outpatient ST services same deficits (08/26/2021 and 09/07/2021).  [name]      07/21/2019 - pt underwent MRI guided focused ultrasound thalamotomy LEFT sided for treatment of essential tremor of RIGHT arm    07/30/2021 - pt underwent MRI guided focused ultrasound thalamotomy RIGHT sided for treatment of essential tremor of LEFT arm        DIAGNOSTIC FINDINGS:    MRi 07/05/2021 consistent with recent MR guided focus ultrasound ablation in the right subthalamic region A focus of diffusion restriction right subthalamic region measures 9 x 6 mm     MBSS 2017    MBSS 11/04/2021 Pt presents with overall adequate oropharyngeal abilities when consuming puree, soft solids, thin liquids via spoon and cup as well as whole barium tablet with thin liquids. Pt's swallow function  doesn't appear any different than documented in his previous MBSS (2017) which stated, "Thin liquid- small amount of laryngeal penetration with rapid successive swallowing maneuvers. With single swallowing maneuvers no laryngeal penetration is observed. As well in this previous study, pt continues to demonstrate penetration with trace aspiration when consuming successive swallows. Pt demonstrated understanding need to take single sips as he stated that he doesn't consume consecutive sips at baseline. At this time, pt presents with adequate oropharyngeal abilities and reduced risk of aspiration when consuming regular diet with thin liquids via single cup sips and medicine whole with thin liquids. Skilled ST intervention is not indicated.   THERAPY DIAG:  Cognitive communication deficit  Dementia in Alzheimer's disease with depression ([place])  Rationale for Evaluation and Treatment Rehabilitation  SUBJECTIVE: pt appeared to be in better spirits today  Pt accompanied by: significant other  PAIN:  Are you having pain? No  PATIENT GOALS: to get better  OBJECTIVE:   TODAY'S TREATMENT:  Skilled treatment session focused on pt's cognitive communication goals. SLP facilitated session by providing the following interventions:   Skilled verbal and written information provided on types of memory (short-term, long-term, procedural, prospective). Functional examples provided of each. Shared information on expectations for memory as well recommendation for pt's wife to begin managing pt's medication. Pt agreeable.    PATIENT EDUCATION: Education details: see above Person educated: Patient and Spouse Education method: Consulting civil engineer, Media planner, Verbal cues, and Handouts Education comprehension: needs further education  HOME EXERCISE PROGRAM:  Wife and pt to organize pill box (SLP provided pill box), continue practicing harmonica and schedule another evening to play at church  GOALS: Goals reviewed  with patient? Yes  SHORT TERM GOALS: Target date: 10 sessions  Patient will demonstrate understanding of functional cognitive activities for home maintenance program with moderate assistance Baseline: Maximal Goal status: INITIAL   2.  Given moderate assistance, patient will demonstrate knowledge of appropriate activities to support cognitive and  language function outside of ST with assistance from family.  Baseline: Maximal Goal status: INITIAL   3.  Patient will report engagement in cognitive activities outside of ST 4/7 days.  Baseline:  Goal status: INITIAL   4.  Pt will verbalize and demonstrate how to implement 1 memory and attention strategies to aid daily functioning with moderate A over 2 sessions  Baseline: currently not using any strategies Goal status: INITIAL     LONG TERM GOALS: Target date: 05/17/2022 Patient will demonstrate understanding of functional cognitive activities for home maintenance program with minimal assistance Baseline: maximal Goal status: INITIAL  ASSESSMENT:  CLINICAL IMPRESSION: Pt much more engaged in session and as a receptive ideas presented therein.   OBJECTIVE IMPAIRMENTS include attention, memory, awareness, executive functioning, dysarthria, dysphagia, and motor speech . These impairments are limiting patient from managing medications, managing appointments, managing finances, household responsibilities, ADLs/IADLs, effectively communicating at home and in community, and safety when swallowing. Factors affecting potential to achieve goals and functional outcome are ability to learn/carryover information, co-morbidities, cooperation/participation level, medical prognosis, previous level of function, and severity of impairments. Patient will benefit from skilled SLP services to address above impairments and improve overall function.  REHAB POTENTIAL: Fair bilateral thalamotomy  PLAN: SLP FREQUENCY: 1-2x/week  SLP DURATION: 8  weeks  PLANNED INTERVENTIONS: Aspiration precaution training, Environmental controls, Cueing hierachy, Cognitive reorganization, Internal/external aids, Functional tasks, SLP instruction and feedback, Compensatory strategies, and Patient/family education  Elis Sauber B. Rutherford Nail, M.S., CCC-SLP, Mining engineer Certified Brain Injury [place]  [place] Office [phone] Ascom [phone] Fax [phone]

## 2022-04-20 ENCOUNTER — Encounter: Payer: PPO | Admitting: Speech Pathology

## 2022-04-20 ENCOUNTER — Ambulatory Visit: Payer: PPO | Admitting: Speech Pathology

## 2022-04-20 DIAGNOSIS — R41841 Cognitive communication deficit: Secondary | ICD-10-CM | POA: Diagnosis not present

## 2022-04-20 DIAGNOSIS — H0289 Other specified disorders of eyelid: Secondary | ICD-10-CM | POA: Diagnosis not present

## 2022-04-20 DIAGNOSIS — H04123 Dry eye syndrome of bilateral lacrimal glands: Secondary | ICD-10-CM | POA: Diagnosis not present

## 2022-04-20 DIAGNOSIS — G309 Alzheimer's disease, unspecified: Secondary | ICD-10-CM

## 2022-04-20 DIAGNOSIS — Z961 Presence of intraocular lens: Secondary | ICD-10-CM | POA: Diagnosis not present

## 2022-04-23 NOTE — Therapy (Unsigned)
OUTPATIENT SPEECH LANGUAGE PATHOLOGY TREATMENT NOTE   Patient Name: Theodore [name] MRN: [id number] DOB:1930-03-12, 89 y.o., male Today's Date: 04/20/2022   PCP: [name], MD REFERRING PROVIDER: [name], MD  END OF SESSION:    End of Session - 04/20/22 2051     Visit Number 9    Number of Visits 25    Date for SLP Re-Evaluation 05/17/22    Authorization Type [employer] P[employer]    Progress Note Due on Visit 10    SLP Start Time 1100    SLP Stop Time  1200    SLP Time Calculation (min) 60 min    Activity Tolerance Patient tolerated treatment well               Past Medical History:  Diagnosis Date   Actinic keratosis    Arthritis    knees   Asymptomatic Sinus bradycardia    a. 05/2016 Zio: Avg HR 61 (41-167).   Benign essential tremor    Cancer ([place]) 2002   melanoma right ear   Colon polyp    COPD (chronic obstructive pulmonary disease) ([place])    pt said he believes it is a misdiagnosis    Coronary artery calcification seen on CT scan    a. 06/2016 CTA Chest: cor Ca2+; b. 05/2017 MV: EF >65%. No ischemia/infarct.   Depression    History of echocardiogram    a. 04/2016 Echo: EF 60-65%, mild conc LVH. Nl PASP.   History of kidney stones    Hx of dysplastic nevus 05/07/2017   L anterior shoulder, severe   Hx of dysplastic nevus 09/06/2020   R lower sternum, moderate atypia   Hypothyroidism    Hypothyroidism    Laceration of right lower extremity    Melanoma ([place]) 1990's per pt   R ear   Melanoma in situ ([place]) 01/12/2014   left jaw   MSSA infection, non-invasive    Palpitations    a. 05/2016 Zio: Avg HR 61 (41-167). 9 SVT runs (fastest 167 - 5 beats; longest 8 beats - 101 bpm). Rare PACs/PVCs.   PVC's (premature ventricular contractions)    Rhinitis    Sepsis due to cellulitis ([place]) 11/28/2021   Severe sepsis ([place]) 11/28/2021   Wears dentures    partial upper and lower   Wears hearing aid in both ears    Past Surgical History:  Procedure  Laterality Date   ADENOIDECTOMY     CATARACT EXTRACTION W/PHACO Left 10/22/2017   Procedure: CATARACT EXTRACTION PHACO AND INTRAOCULAR LENS PLACEMENT ([place])  LEFT;  Surgeon: Eulogio Bear, MD;  Location: [place];  Service: Ophthalmology;  Laterality: Left;   CATARACT EXTRACTION W/PHACO Right 11/20/2017   Procedure: CATARACT EXTRACTION PHACO AND INTRAOCULAR LENS PLACEMENT (IOC) RIGHT;  Surgeon: Eulogio Bear, MD;  Location: [place];  Service: Ophthalmology;  Laterality: Right;   ESOPHAGOGASTRODUODENOSCOPY (EGD) WITH PROPOFOL N/A 10/31/2016   Procedure: ESOPHAGOGASTRODUODENOSCOPY (EGD) WITH PROPOFOL;  Surgeon: Lollie Sails, MD;  Location[facility] ENDOSCOPY;  Service: Endoscopy;  Laterality: N/A;   I & D EXTREMITY Right 11/29/2021   Procedure: IRRIGATION AND DEBRIDEMENT EXTREMITY;  Surgeon: Olean Ree, MD;  Location: [facility] ORS;  Service: General;  Laterality: Right;   JOINT REPLACEMENT Right 2003   knee   knee meniscus repair Right    MELANOMA EXCISION Right    ear. Followed by Dr. 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 ([place]) 11/28/2021   Dyslipidemia 11/28/2021   GERD without esophagitis 11/28/2021   Proximal muscle weakness 08/27/2021   Speech disturbance 08/26/2021   Adverse drug reaction, initial encounter 05/04/2021   Lumbar radiculopathy 04/25/2021   Thoracic aortic atherosclerosis ([place]) 01/17/2021   Dizziness 12/31/2020   Anxiety in acute stress reaction 12/07/2020   Mild cognitive impairment with memory loss 11/30/2020   Elevated PSA, between 10 and less than 20 ng/ml 11/30/2020   Chronic kidney disease, stage 3b ([place]) 11/10/2020   Benign hypertensive kidney disease with chronic kidney disease 11/10/2020   COVID-19 virus infection 04/06/2020    Acquired trigger finger of left middle finger 08/07/2019   Carpal tunnel syndrome of right wrist 07/10/2019   Ulnar neuropathy 06/05/2019   Disorder of bursae of shoulder region 01/29/2019   Localized, primary osteoarthritis of shoulder region 01/29/2019   Localized, secondary osteoarthritis of the shoulder region 01/29/2019   Osteoarthritis of knee 01/29/2019   Shoulder joint pain 01/29/2019   Chronic obstructive pulmonary disease ([place]) 09/24/2018   Grief 09/02/2018   Cervical radiculopathy 08/14/2018   PVC (premature ventricular contraction) 03/21/2018   PAD (peripheral artery disease) ([facility])    Anterior knee pain, left 12/26/2015   Hypothyroidism 07/04/2014   Nonallergic vasomotor rhinitis 07/01/2014   Hypertrophy of prostate with urinary obstruction and other lower urinary tract symptoms (LUTS) 08/08/2013   Low serum testosterone level 04/28/2013   Actinic keratosis 05/10/2010   History of malignant melanoma of skin 05/10/2010    ONSET DATE: date of referrals 02/01/2022 and 02/22/2022    REFERRING DIAG: R47.1 (ICD-10-CM) - Dysarthria R41.89 (ICD-10-CM) - Cognitive decline     PERTINENT HISTORY: Pt with recent course of Outpatient ST services same deficits (08/26/2021 and 09/07/2021).  [name]      07/21/2019 - pt underwent MRI guided focused ultrasound thalamotomy LEFT sided for treatment of essential tremor of RIGHT arm    07/30/2021 - pt underwent MRI guided focused ultrasound thalamotomy RIGHT sided for treatment of essential tremor of LEFT arm        DIAGNOSTIC FINDINGS:    MRi 07/05/2021 consistent with recent MR guided focus ultrasound ablation in the right subthalamic region A focus of diffusion restriction right subthalamic region measures 9 x 6 mm     MBSS 2017    MBSS 11/04/2021 Pt presents with overall adequate oropharyngeal abilities when consuming puree, soft solids, thin liquids via spoon and cup as well as whole barium tablet with thin liquids. Pt's swallow  function doesn't appear any different than documented in his previous MBSS (2017) which stated, "Thin liquid- small amount of laryngeal penetration with rapid successive swallowing maneuvers. With single swallowing maneuvers no laryngeal penetration is observed. As well in this previous study, pt continues to demonstrate penetration with trace aspiration when consuming successive swallows. Pt demonstrated understanding need to take single sips as he stated that he doesn't consume consecutive sips at baseline. At this time, pt presents with adequate oropharyngeal abilities and reduced risk of aspiration when consuming regular diet with thin liquids via single cup sips and medicine whole with thin liquids. Skilled ST intervention is not indicated.   THERAPY DIAG:  Cognitive communication deficit  Dementia in Alzheimer's disease with depression ([place])  Rationale for Evaluation and Treatment Rehabilitation  SUBJECTIVE: pt appeared to be in better spirits today  Pt accompanied by: significant other  PAIN:  Are  you having pain? No  PATIENT GOALS: to get better  OBJECTIVE:   TODAY'S TREATMENT:  Skilled treatment session focused on pt's cognitive communication goals. SLP facilitated session by providing the following interventions:   Continued skilled education provided to patient and his wife on expectations of responses from pt given new situations or environments. Pt began exhibiting s/s of anxiety, so pt was tasked with explaining the harmonica to SLP student while SLP provided additional education to pt's wife.        PATIENT EDUCATION: Education details: see above Person educated: Patient and Spouse Education method: Consulting civil engineer, Media planner, Verbal cues, and Handouts Education comprehension: needs further education  HOME EXERCISE PROGRAM:  Wife and pt to organize pill box (SLP provided pill box), continue practicing harmonica and schedule another evening to play at  church  GOALS: Goals reviewed with patient? Yes  SHORT TERM GOALS: Target date: 10 sessions  Patient will demonstrate understanding of functional cognitive activities for home maintenance program with moderate assistance Baseline: Maximal Goal status: INITIAL   2.  Given moderate assistance, patient will demonstrate knowledge of appropriate activities to support cognitive and  language function outside of ST with assistance from family.  Baseline: Maximal Goal status: INITIAL   3.  Patient will report engagement in cognitive activities outside of ST 4/7 days.  Baseline:  Goal status: INITIAL   4.  Pt will verbalize and demonstrate how to implement 1 memory and attention strategies to aid daily functioning with moderate A over 2 sessions  Baseline: currently not using any strategies Goal status: INITIAL     LONG TERM GOALS: Target date: 05/17/2022 Patient will demonstrate understanding of functional cognitive activities for home maintenance program with minimal assistance Baseline: maximal Goal status: INITIAL  ASSESSMENT:  CLINICAL IMPRESSION: Pt much more engaged in session and as a receptive ideas presented therein.   OBJECTIVE IMPAIRMENTS include attention, memory, awareness, executive functioning, dysarthria, dysphagia, and motor speech . These impairments are limiting patient from managing medications, managing appointments, managing finances, household responsibilities, ADLs/IADLs, effectively communicating at home and in community, and safety when swallowing. Factors affecting potential to achieve goals and functional outcome are ability to learn/carryover information, co-morbidities, cooperation/participation level, medical prognosis, previous level of function, and severity of impairments. Patient will benefit from skilled SLP services to address above impairments and improve overall function.  REHAB POTENTIAL: Fair bilateral thalamotomy  PLAN: SLP FREQUENCY:  1-2x/week  SLP DURATION: 8 weeks  PLANNED INTERVENTIONS: Aspiration precaution training, Environmental controls, Cueing hierachy, Cognitive reorganization, Internal/external aids, Functional tasks, SLP instruction and feedback, Compensatory strategies, and Patient/family education  Keerstin Bjelland B. Rutherford Nail, M.S., CCC-SLP, Mining engineer Certified Brain Injury [place]  [place] Office [phone] Ascom [phone] Fax [phone]

## 2022-04-24 ENCOUNTER — Encounter: Payer: PPO | Admitting: Speech Pathology

## 2022-04-24 DIAGNOSIS — N182 Chronic kidney disease, stage 2 (mild): Secondary | ICD-10-CM | POA: Diagnosis not present

## 2022-04-25 ENCOUNTER — Ambulatory Visit: Payer: PPO | Admitting: Dermatology

## 2022-04-25 ENCOUNTER — Ambulatory Visit: Payer: PPO | Admitting: Speech Pathology

## 2022-04-25 VITALS — BP 129/65

## 2022-04-25 DIAGNOSIS — F0283 Dementia in other diseases classified elsewhere, unspecified severity, with mood disturbance: Secondary | ICD-10-CM

## 2022-04-25 DIAGNOSIS — L570 Actinic keratosis: Secondary | ICD-10-CM | POA: Diagnosis not present

## 2022-04-25 DIAGNOSIS — L814 Other melanin hyperpigmentation: Secondary | ICD-10-CM

## 2022-04-25 DIAGNOSIS — D229 Melanocytic nevi, unspecified: Secondary | ICD-10-CM

## 2022-04-25 DIAGNOSIS — Z1283 Encounter for screening for malignant neoplasm of skin: Secondary | ICD-10-CM

## 2022-04-25 DIAGNOSIS — D225 Melanocytic nevi of trunk: Secondary | ICD-10-CM | POA: Diagnosis not present

## 2022-04-25 DIAGNOSIS — Z86006 Personal history of melanoma in-situ: Secondary | ICD-10-CM

## 2022-04-25 DIAGNOSIS — R41841 Cognitive communication deficit: Secondary | ICD-10-CM

## 2022-04-25 DIAGNOSIS — Z85820 Personal history of malignant melanoma of skin: Secondary | ICD-10-CM

## 2022-04-25 DIAGNOSIS — L820 Inflamed seborrheic keratosis: Secondary | ICD-10-CM | POA: Diagnosis not present

## 2022-04-25 DIAGNOSIS — D485 Neoplasm of uncertain behavior of skin: Secondary | ICD-10-CM

## 2022-04-25 DIAGNOSIS — L821 Other seborrheic keratosis: Secondary | ICD-10-CM

## 2022-04-25 DIAGNOSIS — D224 Melanocytic nevi of scalp and neck: Secondary | ICD-10-CM | POA: Diagnosis not present

## 2022-04-25 DIAGNOSIS — L578 Other skin changes due to chronic exposure to nonionizing radiation: Secondary | ICD-10-CM | POA: Diagnosis not present

## 2022-04-25 NOTE — Patient Instructions (Signed)
Cryotherapy Aftercare  Wash gently with soap and water everyday.   Apply Vaseline and Band-Aid daily until healed.  Wound Care Instructions  Cleanse wound gently with soap and water once a day then pat dry with clean gauze. Apply a thin coat of Petrolatum (petroleum jelly, "Vaseline") over the wound (unless you have an allergy to this). We recommend that you use a new, sterile tube of Vaseline. Do not pick or remove scabs. Do not remove the yellow or white "healing tissue" from the base of the wound.  Cover the wound with fresh, clean, nonstick gauze and secure with paper tape. You may use Band-Aids in place of gauze and tape if the wound is small enough, but would recommend trimming much of the tape off as there is often too much. Sometimes Band-Aids can irritate the skin.  You should call the office for your biopsy report after 1 week if you have not already been contacted.  If you experience any problems, such as abnormal amounts of bleeding, swelling, significant bruising, significant pain, or evidence of infection, please call the office immediately.  FOR ADULT SURGERY PATIENTS: If you need something for pain relief you may take 1 extra strength Tylenol (acetaminophen) AND 2 Ibuprofen (200mg each) together every 4 hours as needed for pain. (do not take these if you are allergic to them or if you have a reason you should not take them.) Typically, you may only need pain medication for 1 to 3 days.      Due to recent changes in healthcare laws, you may see results of your pathology and/or laboratory studies on MyChart before the doctors have had a chance to review them. We understand that in some cases there may be results that are confusing or concerning to you. Please understand that not all results are received at the same time and often the doctors may need to interpret multiple results in order to provide you with the best plan of care or course of treatment. Therefore, we ask that you  please give us 2 business days to thoroughly review all your results before contacting the office for clarification. Should we see a critical lab result, you will be contacted sooner.   If You Need Anything After Your Visit  If you have any questions or concerns for your doctor, please call our main line at [phone] and press option 4 to reach your doctor's medical assistant. If no one answers, please leave a voicemail as directed and we will return your call as soon as possible. Messages left after 4 pm will be answered the following business day.   You may also send us a message via MyChart. We typically respond to MyChart messages within 1-2 business days.  For prescription refills, please ask your pharmacy to contact our office. Our fax number is [phone].  If you have an urgent issue when the clinic is closed that cannot wait until the next business day, you can page your doctor at the number below.    Please note that while we do our best to be available for urgent issues outside of office hours, we are not available 24/7.   If you have an urgent issue and are unable to reach us, you may choose to seek medical care at your doctor's office, retail clinic, urgent care center, or emergency room.  If you have a medical emergency, please immediately call 911 or go to the emergency department.  Pager Numbers  - Dr. Kowalski: [phone]  -   Dr. Moye: [phone]  - Dr. Stewart: [phone]  In the event of inclement weather, please call our main line at [phone] for an update on the status of any delays or closures.  Dermatology Medication Tips: Please keep the boxes that topical medications come in in order to help keep track of the instructions about where and how to use these. Pharmacies typically print the medication instructions only on the boxes and not directly on the medication tubes.   If your medication is too expensive, please contact our office at  [phone] option 4 or send us a message through MyChart.   We are unable to tell what your co-pay for medications will be in advance as this is different depending on your insurance coverage. However, we may be able to find a substitute medication at lower cost or fill out paperwork to get insurance to cover a needed medication.   If a prior authorization is required to get your medication covered by your insurance company, please allow us 1-2 business days to complete this process.  Drug prices often vary depending on where the prescription is filled and some pharmacies may offer cheaper prices.  The website www.goodrx.com contains coupons for medications through different pharmacies. The prices here do not account for what the cost may be with help from insurance (it may be cheaper with your insurance), but the website can give you the price if you did not use any insurance.  - You can print the associated coupon and take it with your prescription to the pharmacy.  - You may also stop by our office during regular business hours and pick up a GoodRx coupon card.  - If you need your prescription sent electronically to a different pharmacy, notify our office through [facility] MyChart or by phone at [phone] option 4.     Si Usted Necesita Algo Despus de Su Visita  Tambin puede enviarnos un mensaje a travs de MyChart. Por lo general respondemos a los mensajes de MyChart en el transcurso de 1 a 2 das hbiles.  Para renovar recetas, por favor pida a su farmacia que se ponga en contacto con nuestra oficina. Nuestro nmero de fax es el [phone].  Si tiene un asunto urgente cuando la clnica est cerrada y que no puede esperar hasta el siguiente da hbil, puede llamar/localizar a su doctor(a) al nmero que aparece a continuacin.   Por favor, tenga en cuenta que aunque hacemos todo lo posible para estar disponibles para asuntos urgentes fuera del horario de oficina, no estamos  disponibles las 24 horas del da, los 7 das de la semana.   Si tiene un problema urgente y no puede comunicarse con nosotros, puede optar por buscar atencin mdica  en el consultorio de su doctor(a), en una clnica privada, en un centro de atencin urgente o en una sala de emergencias.  Si tiene una emergencia mdica, por favor llame inmediatamente al 911 o vaya a la sala de emergencias.  Nmeros de bper  - Dr. Kowalski: [phone]  - Dra. Moye: [phone]  - Dra. Stewart: [phone]  En caso de inclemencias del tiempo, por favor llame a nuestra lnea principal al [phone] para una actualizacin sobre el estado de cualquier retraso o cierre.  Consejos para la medicacin en dermatologa: Por favor, guarde las cajas en las que vienen los medicamentos de uso tpico para ayudarle a seguir las instrucciones sobre dnde y cmo usarlos. Las farmacias generalmente imprimen las instrucciones del medicamento slo en las cajas y   no directamente en los tubos del medicamento.   Si su medicamento es muy caro, por favor, pngase en contacto con nuestra oficina llamando al [phone] y presione la opcin 4 o envenos un mensaje a travs de MyChart.   No podemos decirle cul ser su copago por los medicamentos por adelantado ya que esto es diferente dependiendo de la cobertura de su seguro. Sin embargo, es posible que podamos encontrar un medicamento sustituto a menor costo o llenar un formulario para que el seguro cubra el medicamento que se considera necesario.   Si se requiere una autorizacin previa para que su compaa de seguros cubra su medicamento, por favor permtanos de 1 a 2 das hbiles para completar este proceso.  Los precios de los medicamentos varan con frecuencia dependiendo del lugar de dnde se surte la receta y alguna farmacias pueden ofrecer precios ms baratos.  El sitio web www.goodrx.com tiene cupones para medicamentos de diferentes farmacias. Los precios aqu no  tienen en cuenta lo que podra costar con la ayuda del seguro (puede ser ms barato con su seguro), pero el sitio web puede darle el precio si no utiliz ningn seguro.  - Puede imprimir el cupn correspondiente y llevarlo con su receta a la farmacia.  - Tambin puede pasar por nuestra oficina durante el horario de atencin regular y recoger una tarjeta de cupones de GoodRx.  - Si necesita que su receta se enve electrnicamente a una farmacia diferente, informe a nuestra oficina a travs de MyChart de [place] o por telfono llamando al [phone] y presione la opcin 4.  

## 2022-04-25 NOTE — Progress Notes (Signed)
Follow-Up Visit   Subjective  Theodore [name] is a 89 y.o. male who presents for the following: Annual Exam (History of Melanoma - The patient presents for Upper Body Skin Exam (UBSE) for skin cancer screening and mole check.  The patient has spots, moles and lesions to be evaluated, some may be new or changing and the patient has concerns that these could be cancer./).  Itchy spots on Left collarbone area.    The following portions of the chart were reviewed this encounter and updated as appropriate:       Review of Systems:  No other skin or systemic complaints except as noted in HPI or Assessment and Plan.  Objective  Well appearing patient in no apparent distress; mood and affect are within normal limits.  All skin waist up examined.  Left clavicle x 2 Erythematous stuck-on, waxy papule or plaque  Right upper abdomen 31m medium brown macule  Neck - Anterior 6 mm brown macule with slight irregular border  Nasal dorsum Erythematous thin papules/macules with gritty scale.     Assessment & Plan   History of Melanoma - treated in the 90s - No evidence of recurrence today of right ear - Recommend regular full body skin exams - Recommend daily broad spectrum sunscreen SPF 30+ to sun-exposed areas, reapply every 2 hours as needed.  - Call if any new or changing lesions are noted between office visits  History of Melanoma in Situ - 2015 - No evidence of recurrence today of left jaw - Recommend regular full body skin exams - Recommend daily broad spectrum sunscreen SPF 30+ to sun-exposed areas, reapply every 2 hours as needed.  - Call if any new or changing lesions are noted between office visits   Lentigines - Scattered tan macules - Due to sun exposure - Benign-appearing, observe - Recommend daily broad spectrum sunscreen SPF 30+ to sun-exposed areas, reapply every 2 hours as needed. - Call for any changes  Seborrheic Keratoses - Stuck-on, waxy, tan-brown papules  and/or plaques  - Benign-appearing - Discussed benign etiology and prognosis. - Observe - Call for any changes  Melanocytic Nevi - Tan-brown and/or pink-flesh-colored symmetric macules and papules - Benign appearing on exam today - Observation - Call clinic for new or changing moles - Recommend daily use of broad spectrum spf 30+ sunscreen to sun-exposed areas.   Hemangiomas - Red papules - Discussed benign nature - Observe - Call for any changes  Actinic Damage - Chronic condition, secondary to cumulative UV/sun exposure - diffuse scaly erythematous macules with underlying dyspigmentation - Recommend daily broad spectrum sunscreen SPF 30+ to sun-exposed areas, reapply every 2 hours as needed.  - Staying in the shade or wearing long sleeves, sun glasses (UVA+UVB protection) and wide brim hats (4-inch brim around the entire circumference of the hat) are also recommended for sun protection.  - Call for new or changing lesions.  Skin cancer screening performed today.  Inflamed seborrheic keratosis Left clavicle x 2  Destruction of lesion - Left clavicle x 2  Destruction method: cryotherapy   Informed consent: discussed and consent obtained   Timeout:  patient name, date of birth, surgical site, and procedure verified Lesion destroyed using liquid nitrogen: Yes   Region frozen until ice ball extended beyond lesion: Yes   Outcome: patient tolerated procedure well with no complications   Post-procedure details: wound care instructions given   Additional details:  Prior to procedure, discussed risks of blister formation, small wound, skin dyspigmentation, or  rare scar following cryotherapy. Recommend Vaseline ointment to treated areas while healing.   Nevus Right upper abdomen  Benign-appearing.  Observation.  Call clinic for new or changing moles.  Recommend daily use of broad spectrum spf 30+ sunscreen to sun-exposed areas.    Neoplasm of uncertain behavior of skin Neck -  Anterior  Epidermal / dermal shaving  Lesion diameter (cm):  0.9 Informed consent: discussed and consent obtained   Timeout: patient name, date of birth, surgical site, and procedure verified   Procedure prep:  Patient was prepped and draped in usual sterile fashion Prep type:  Isopropyl alcohol Anesthesia: the lesion was anesthetized in a standard fashion   Anesthetic:  1% lidocaine w/ epinephrine 1-100,000 buffered w/ 8.4% NaHCO3 Instrument used: flexible razor blade   Hemostasis achieved with: pressure, aluminum chloride and electrodesiccation   Outcome: patient tolerated procedure well   Post-procedure details: sterile dressing applied and wound care instructions given   Dressing type: bandage and petrolatum    Specimen 1 - Surgical pathology Differential Diagnosis: Lentigo vs SK R/O atypia Check Margins: No  AK (actinic keratosis) Nasal dorsum  Actinic keratoses are precancerous spots that appear secondary to cumulative UV radiation exposure/sun exposure over time. They are chronic with expected duration over 1 year. A portion of actinic keratoses will progress to squamous cell carcinoma of the skin. It is not possible to reliably predict which spots will progress to skin cancer and so treatment is recommended to prevent development of skin cancer.  Recommend daily broad spectrum sunscreen SPF 30+ to sun-exposed areas, reapply every 2 hours as needed.  Recommend staying in the shade or wearing long sleeves, sun glasses (UVA+UVB protection) and wide brim hats (4-inch brim around the entire circumference of the hat). Call for new or changing lesions.   Destruction of lesion - Nasal dorsum  Destruction method: cryotherapy   Informed consent: discussed and consent obtained   Timeout:  patient name, date of birth, surgical site, and procedure verified Lesion destroyed using liquid nitrogen: Yes   Region frozen until ice ball extended beyond lesion: Yes   Outcome: patient  tolerated procedure well with no complications   Post-procedure details: wound care instructions given   Additional details:  Prior to procedure, discussed risks of blister formation, small wound, skin dyspigmentation, or rare scar following cryotherapy. Recommend Vaseline ointment to treated areas while healing.    Return in about 6 months (around 10/24/2022) for Aks, h/o melanoma.  I, Ashok Cordia, CMA, am acting as scribe for [name], MD .  Documentation: I have reviewed the above documentation for accuracy and completeness, and I agree with the above.  [name] MD

## 2022-04-26 NOTE — Therapy (Incomplete)
OUTPATIENT SPEECH LANGUAGE PATHOLOGY TREATMENT NOTE   Patient Name: Theodore [name] MRN: [id number] DOB:[birth year], 89 y.o., male Today's Date: 04/20/2022   PCP: [name], MD REFERRING PROVIDER: [name], MD  END OF SESSION:    End of Session - 04/20/22 2051     Visit Number 9    Number of Visits 25    Date for SLP Re-Evaluation 05/17/22    Authorization Type [employer] P[employer]    Progress Note Due on Visit 10    SLP Start Time 1100    SLP Stop Time  1200    SLP Time Calculation (min) 60 min    Activity Tolerance Patient tolerated treatment well               Past Medical History:  Diagnosis Date   Actinic keratosis    Arthritis    knees   Asymptomatic Sinus bradycardia    a. 05/2016 Zio: Avg HR 61 (41-167).   Benign essential tremor    Cancer ([place]) 2002   melanoma right ear   Colon polyp    COPD (chronic obstructive pulmonary disease) ([place])    pt said he believes it is a misdiagnosis    Coronary artery calcification seen on CT scan    a. 06/2016 CTA Chest: cor Ca2+; b. 05/2017 MV: EF >65%. No ischemia/infarct.   Depression    History of echocardiogram    a. 04/2016 Echo: EF 60-65%, mild conc LVH. Nl PASP.   History of kidney stones    Hx of dysplastic nevus 05/07/2017   L anterior shoulder, severe   Hx of dysplastic nevus 09/06/2020   R lower sternum, moderate atypia   Hypothyroidism    Hypothyroidism    Laceration of right lower extremity    Melanoma ([place]) 1990's per pt   R ear   Melanoma in situ ([place]) 01/12/2014   left jaw   MSSA infection, non-invasive    Palpitations    a. 05/2016 Zio: Avg HR 61 (41-167). 9 SVT runs (fastest 167 - 5 beats; longest 8 beats - 101 bpm). Rare PACs/PVCs.   PVC's (premature ventricular contractions)    Rhinitis    Sepsis due to cellulitis ([place]) 11/28/2021   Severe sepsis ([place]) 11/28/2021   Wears dentures    partial upper and lower   Wears hearing aid in both ears    Past Surgical History:  Procedure  Laterality Date   ADENOIDECTOMY     CATARACT EXTRACTION W/PHACO Left 10/22/2017   Procedure: CATARACT EXTRACTION PHACO AND INTRAOCULAR LENS PLACEMENT ([place])  LEFT;  Surgeon: Eulogio Bear, MD;  Location: [facility];  Service: Ophthalmology;  Laterality: Left;   CATARACT EXTRACTION W/PHACO Right 11/20/2017   Procedure: CATARACT EXTRACTION PHACO AND INTRAOCULAR LENS PLACEMENT (IOC) RIGHT;  Surgeon: Eulogio Bear, MD;  Location: [facility];  Service: Ophthalmology;  Laterality: Right;   ESOPHAGOGASTRODUODENOSCOPY (EGD) WITH PROPOFOL N/A 10/31/2016   Procedure: ESOPHAGOGASTRODUODENOSCOPY (EGD) WITH PROPOFOL;  Surgeon: Lollie Sails, MD;  Location[facility] ENDOSCOPY;  Service: Endoscopy;  Laterality: N/A;   I & D EXTREMITY Right 11/29/2021   Procedure: IRRIGATION AND DEBRIDEMENT EXTREMITY;  Surgeon: Olean Ree, MD;  Location: [facility] ORS;  Service: General;  Laterality: Right;   JOINT REPLACEMENT Right 2003   knee   knee meniscus repair Right    MELANOMA EXCISION Right    ear. Followed by Dr. Nehemiah Massed   PATELLECTOMY Bilateral    TONSILLECTOMY     Patient Active Problem List  Diagnosis Date Noted   Sialorrhea 02/02/2022   Protein-calorie malnutrition, moderate ([place]) 11/29/2021   Dementia in Alzheimer's disease with depression ([place]) 11/28/2021   Dyslipidemia 11/28/2021   GERD without esophagitis 11/28/2021   Proximal muscle weakness 08/27/2021   Speech disturbance 08/26/2021   Adverse drug reaction, initial encounter 05/04/2021   Lumbar radiculopathy 04/25/2021   Thoracic aortic atherosclerosis ([place]) 01/17/2021   Dizziness 12/31/2020   Anxiety in acute stress reaction 12/07/2020   Mild cognitive impairment with memory loss 11/30/2020   Elevated PSA, between 10 and less than 20 ng/ml 11/30/2020   Chronic kidney disease, stage 3b ([place]) 11/10/2020   Benign hypertensive kidney disease with chronic kidney disease 11/10/2020   COVID-19 virus infection 04/06/2020    Acquired trigger finger of left middle finger 08/07/2019   Carpal tunnel syndrome of right wrist 07/10/2019   Ulnar neuropathy 06/05/2019   Disorder of bursae of shoulder region 01/29/2019   Localized, primary osteoarthritis of shoulder region 01/29/2019   Localized, secondary osteoarthritis of the shoulder region 01/29/2019   Osteoarthritis of knee 01/29/2019   Shoulder joint pain 01/29/2019   Chronic obstructive pulmonary disease ([place]) 09/24/2018   Grief 09/02/2018   Cervical radiculopathy 08/14/2018   PVC (premature ventricular contraction) 03/21/2018   PAD (peripheral artery disease) ([facility])    Anterior knee pain, left 12/26/2015   Hypothyroidism 07/04/2014   Nonallergic vasomotor rhinitis 07/01/2014   Hypertrophy of prostate with urinary obstruction and other lower urinary tract symptoms (LUTS) 08/08/2013   Low serum testosterone level 04/28/2013   Actinic keratosis 05/10/2010   History of malignant melanoma of skin 05/10/2010    ONSET DATE: date of referrals 02/01/2022 and 02/22/2022    REFERRING DIAG: R47.1 (ICD-10-CM) - Dysarthria R41.89 (ICD-10-CM) - Cognitive decline     PERTINENT HISTORY: Pt with recent course of Outpatient ST services same deficits (08/26/2021 and 09/07/2021).  Theodore [name]      07/21/2019 - pt underwent MRI guided focused ultrasound thalamotomy LEFT sided for treatment of essential tremor of RIGHT arm    07/30/2021 - pt underwent MRI guided focused ultrasound thalamotomy RIGHT sided for treatment of essential tremor of LEFT arm        DIAGNOSTIC FINDINGS:    MRi 07/05/2021 consistent with recent MR guided focus ultrasound ablation in the right subthalamic region A focus of diffusion restriction right subthalamic region measures 9 x 6 mm     MBSS 2017    MBSS 11/04/2021 Pt presents with overall adequate oropharyngeal abilities when consuming puree, soft solids, thin liquids via spoon and cup as well as whole barium tablet with thin liquids. Pt's swallow  function doesn't appear any different than documented in his previous MBSS (2017) which stated, "Thin liquid- small amount of laryngeal penetration with rapid successive swallowing maneuvers. With single swallowing maneuvers no laryngeal penetration is observed. As well in this previous study, pt continues to demonstrate penetration with trace aspiration when consuming successive swallows. Pt demonstrated understanding need to take single sips as he stated that he doesn't consume consecutive sips at baseline. At this time, pt presents with adequate oropharyngeal abilities and reduced risk of aspiration when consuming regular diet with thin liquids via single cup sips and medicine whole with thin liquids. Skilled ST intervention is not indicated.   THERAPY DIAG:  Dementia in Alzheimer's disease with depression ([place])  Cognitive communication deficit  Rationale for Evaluation and Treatment Rehabilitation  SUBJECTIVE: pt appeared to be in better spirits today  Pt accompanied by: significant other  PAIN:  Are  you having pain? No  PATIENT GOALS: to get better  OBJECTIVE:   TODAY'S TREATMENT:  Skilled treatment session focused on pt's cognitive communication goals. SLP facilitated session by providing the following interventions:   Continued skilled education provided to patient and his wife on expectations of responses from pt given new situations or environments. Pt began exhibiting s/s of anxiety, so pt was tasked with explaining the harmonica to SLP student while SLP provided additional education to pt's wife.        PATIENT EDUCATION: Education details: see above Person educated: Patient and Spouse Education method: Consulting civil engineer, Media planner, Verbal cues, and Handouts Education comprehension: needs further education  HOME EXERCISE PROGRAM:  Wife and pt to organize pill box (SLP provided pill box), continue practicing harmonica and schedule another evening to play at  church  GOALS: Goals reviewed with patient? Yes  SHORT TERM GOALS: Target date: 10 sessions  Patient will demonstrate understanding of functional cognitive activities for home maintenance program with moderate assistance Baseline: Maximal Goal status: INITIAL   2.  Given moderate assistance, patient will demonstrate knowledge of appropriate activities to support cognitive and  language function outside of ST with assistance from family.  Baseline: Maximal Goal status: INITIAL   3.  Patient will report engagement in cognitive activities outside of ST 4/7 days.  Baseline:  Goal status: INITIAL   4.  Pt will verbalize and demonstrate how to implement 1 memory and attention strategies to aid daily functioning with moderate A over 2 sessions  Baseline: currently not using any strategies Goal status: INITIAL     LONG TERM GOALS: Target date: 05/17/2022 Patient will demonstrate understanding of functional cognitive activities for home maintenance program with minimal assistance Baseline: maximal Goal status: INITIAL  ASSESSMENT:  CLINICAL IMPRESSION: Pt much more engaged in session and as a receptive ideas presented therein.   OBJECTIVE IMPAIRMENTS include attention, memory, awareness, executive functioning, dysarthria, dysphagia, and motor speech . These impairments are limiting patient from managing medications, managing appointments, managing finances, household responsibilities, ADLs/IADLs, effectively communicating at home and in community, and safety when swallowing. Factors affecting potential to achieve goals and functional outcome are ability to learn/carryover information, co-morbidities, cooperation/participation level, medical prognosis, previous level of function, and severity of impairments. Patient will benefit from skilled SLP services to address above impairments and improve overall function.  REHAB POTENTIAL: Fair bilateral thalamotomy  PLAN: SLP FREQUENCY:  1-2x/week  SLP DURATION: 8 weeks  PLANNED INTERVENTIONS: Aspiration precaution training, Environmental controls, Cueing hierachy, Cognitive reorganization, Internal/external aids, Functional tasks, SLP instruction and feedback, Compensatory strategies, and Patient/family education  Donya Tomaro B. Rutherford Nail, M.S., CCC-SLP, Mining engineer Certified Brain Injury [place]  [place] Office [phone] Ascom [phone] Fax [phone]

## 2022-04-26 NOTE — Therapy (Signed)
OUTPATIENT SPEECH LANGUAGE PATHOLOGY TREATMENT NOTE 10th VISIT PROGRESS NOTE   Patient Name: Theodore [name] MRN: [id number] DOB:09/30/1929, 89 y.o., male Today's Date: 04/25/2022  PCP: [name], MD REFERRING PROVIDER: [name], MD   Speech Therapy Progress Note  Dates of Reporting Period: 02/22/2022 to 04/25/2022  Objective: Patient has been seen for 10 speech therapy sessions this reporting period targeting cognitive communication. During this reporting period, patient made progress towards his STG and LTGs. See skilled intervention, clinical impressions, and goals below for details.  END OF SESSION:   End of Session - 04/26/22 1255     Visit Number 10    Number of Visits 25    Date for SLP Re-Evaluation 05/17/22    Authorization Type [employer] P[employer]    Progress Note Due on Visit 10    SLP Start Time 1400    SLP Stop Time  1500    SLP Time Calculation (min) 60 min    Activity Tolerance Patient tolerated treatment well              Past Medical History:  Diagnosis Date   Actinic keratosis    Arthritis    knees   Asymptomatic Sinus bradycardia    a. 05/2016 Zio: Avg HR 61 (41-167).   Benign essential tremor    Cancer ([place]) 2002   melanoma right ear   Colon polyp    COPD (chronic obstructive pulmonary disease) ([place])    pt said he believes it is a misdiagnosis    Coronary artery calcification seen on CT scan    a. 06/2016 CTA Chest: cor Ca2+; b. 05/2017 MV: EF >65%. No ischemia/infarct.   Depression    History of echocardiogram    a. 04/2016 Echo: EF 60-65%, mild conc LVH. Nl PASP.   History of kidney stones    Hx of dysplastic nevus 05/07/2017   L anterior shoulder, severe   Hx of dysplastic nevus 09/06/2020   R lower sternum, moderate atypia   Hypothyroidism    Hypothyroidism    Laceration of right lower extremity    Melanoma ([place]) 1990's per pt   R ear   Melanoma in situ ([place]) 01/12/2014   left jaw   MSSA infection, non-invasive     Palpitations    a. 05/2016 Zio: Avg HR 61 (41-167). 9 SVT runs (fastest 167 - 5 beats; longest 8 beats - 101 bpm). Rare PACs/PVCs.   PVC's (premature ventricular contractions)    Rhinitis    Sepsis due to cellulitis ([place]) 11/28/2021   Severe sepsis ([place]) 11/28/2021   Wears dentures    partial upper and lower   Wears hearing aid in both ears    Past Surgical History:  Procedure Laterality Date   ADENOIDECTOMY     CATARACT EXTRACTION W/PHACO Left 10/22/2017   Procedure: CATARACT EXTRACTION PHACO AND INTRAOCULAR LENS PLACEMENT ([place])  LEFT;  Surgeon: Eulogio Bear, MD;  Location: [facility];  Service: Ophthalmology;  Laterality: Left;   CATARACT EXTRACTION W/PHACO Right 11/20/2017   Procedure: CATARACT EXTRACTION PHACO AND INTRAOCULAR LENS PLACEMENT (I[facility]) RIGHT;  Surgeon: Eulogio Bear, MD;  Location: [facility];  Service: Ophthalmology;  Laterality: Right;   ESOPHAGOGASTRODUODENOSCOPY (EGD) WITH PROPOFOL N/A 10/31/2016   Procedure: ESOPHAGOGASTRODUODENOSCOPY (EGD) WITH PROPOFOL;  Surgeon: Lollie Sails, MD;  Location: [facility] ENDOSCOPY;  Service: Endoscopy;  Laterality: N/A;   I & D EXTREMITY Right 11/29/2021   Procedure: IRRIGATION AND DEBRIDEMENT EXTREMITY;  Surgeon: Olean Ree, MD;  Location:  [facility] ORS;  Service: General;  Laterality: Right;   JOINT REPLACEMENT Right 2003   knee   knee meniscus repair Right    MELANOMA EXCISION Right    ear. Followed by Dr. 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 ([place]) 11/28/2021   Dyslipidemia 11/28/2021   GERD without esophagitis 11/28/2021   Proximal muscle weakness 08/27/2021   Speech disturbance 08/26/2021   Adverse drug reaction, initial encounter 05/04/2021   Lumbar radiculopathy 04/25/2021   Thoracic aortic  atherosclerosis ([place]) 01/17/2021   Dizziness 12/31/2020   Anxiety in acute stress reaction 12/07/2020   Mild cognitive impairment with memory loss 11/30/2020   Elevated PSA, between 10 and less than 20 ng/ml 11/30/2020   Chronic kidney disease, stage 3b ([place]) 11/10/2020   Benign hypertensive kidney disease with chronic kidney disease 11/10/2020   COVID-19 virus infection 04/06/2020   Acquired trigger finger of left middle finger 08/07/2019   Carpal tunnel syndrome of right wrist 07/10/2019   Ulnar neuropathy 06/05/2019   Disorder of bursae of shoulder region 01/29/2019   Localized, primary osteoarthritis of shoulder region 01/29/2019   Localized, secondary osteoarthritis of the shoulder region 01/29/2019   Osteoarthritis of knee 01/29/2019   Shoulder joint pain 01/29/2019   Chronic obstructive pulmonary disease ([place]) 09/24/2018   Grief 09/02/2018   Cervical radiculopathy 08/14/2018   PVC (premature ventricular contraction) 03/21/2018   PAD (peripheral artery disease) ([place])    Anterior knee pain, left 12/26/2015   Hypothyroidism 07/04/2014   Nonallergic vasomotor rhinitis 07/01/2014   Hypertrophy of prostate with urinary obstruction and other lower urinary tract symptoms (LUTS) 08/08/2013   Low serum testosterone level 04/28/2013   Actinic keratosis 05/10/2010   History of malignant melanoma of skin 05/10/2010    ONSET DATE: date of referrals 02/01/2022 and 02/22/2022    REFERRING DIAG: R47.1 (ICD-10-CM) - Dysarthria R41.89 (ICD-10-CM) - Cognitive decline     PERTINENT HISTORY: Pt with recent course of Outpatient ST services same deficits (08/26/2021 and 09/07/2021).  [name]      07/21/2019 - pt underwent MRI guided focused ultrasound thalamotomy LEFT sided for treatment of essential tremor of RIGHT arm    07/30/2021 - pt underwent MRI guided focused ultrasound thalamotomy RIGHT sided for treatment of essential tremor of LEFT arm        DIAGNOSTIC FINDINGS:    MRi  07/05/2021 consistent with recent MR guided focus ultrasound ablation in the right subthalamic region A focus of diffusion restriction right subthalamic region measures 9 x 6 mm     MBSS 2017    MBSS 11/04/2021 Pt presents with overall adequate oropharyngeal abilities when consuming puree, soft solids, thin liquids via spoon and cup as well as whole barium tablet with thin liquids. Pt's swallow function doesn't appear any different than documented in his previous MBSS (2017) which stated, "Thin liquid- small amount of laryngeal penetration with rapid successive swallowing maneuvers. With single swallowing maneuvers no laryngeal penetration is observed. As well in this previous study, pt continues to demonstrate penetration with trace aspiration when consuming successive swallows. Pt demonstrated understanding need to take single sips as he stated that he doesn't consume consecutive sips at baseline. At this time, pt presents with adequate oropharyngeal abilities and reduced risk of aspiration when consuming regular diet with thin liquids via single cup sips  and medicine whole with thin liquids. Skilled ST intervention is not indicated.   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 No  PATIENT GOALS: to get better  OBJECTIVE:   TODAY'S TREATMENT:  Skilled treatment session focused on pt's cognitive communication goals. SLP facilitated session by providing the following interventions:   Skilled observation was provided of pt consuming regular solids with thin liquids via cup for cognitive education that pt has the oral abilities needed. Despite successful mastication, it was difficult for him to transfer this information to food items at home. Oral motor  exam performed to assess areas that pt reports as requiring surgery prior to his final denture fitting. Exam revealed multiple sores spots that appear to be boney projections. He states high anxiety over the cost of having these areas treated. Pt had increased anxiety and inaccurate recall of information related to procedure. SLP provided recommend for pt to cease calling his doctors and have a meeting with his wife [name] prior to any appts and have [name] as any follow up questions. Pt was immediately agreeable and appeared more relaxed as a result. Time spent creating questions for upcoming dentist appt.        PATIENT EDUCATION: Education details: see above Person educated: Patient and Spouse Education method: Consulting civil engineer, Media planner, Verbal cues, and Handouts Education comprehension: needs further education  HOME EXERCISE PROGRAM:  Wife and pt to organize pill box (SLP provided pill box), continue practicing harmonica and schedule another evening to play at church  GOALS: Goals reviewed with patient? Yes  SHORT TERM GOALS: Target date: 10 sessions  Patient will demonstrate understanding of functional cognitive activities for home maintenance program with moderate assistance Baseline: Maximal Goal status: ONGOING   2.  Given moderate assistance, patient will demonstrate knowledge of appropriate activities to support cognitive and  language function outside of ST with assistance from family.  Baseline: Maximal Goal status: ONGOING   3.  Patient will report engagement in cognitive activities outside of ST 4/7 days.  Baseline:  Goal status: ONGOING   4.  Pt will verbalize and demonstrate how to implement 1 memory and attention strategies to aid daily functioning with moderate A over 2 sessions  Baseline: currently not using any strategies Goal status: ONGOING     LONG TERM GOALS: Target date: 05/17/2022 Patient will demonstrate understanding of functional cognitive activities  for home maintenance program with minimal assistance Baseline: maximal Goal status: ONGOING  ASSESSMENT:  CLINICAL IMPRESSION: Pt has made slower than anticipated progress during his initial course of treatment. However, over the last 2 weeks, his progress has increased d/t a recent change in medication.   OBJECTIVE IMPAIRMENTS include attention, memory, awareness, executive functioning, dysarthria, dysphagia, and motor speech . These impairments are limiting patient from managing medications, managing appointments, managing finances, household responsibilities, ADLs/IADLs, effectively communicating at home and in community, and safety when swallowing. Factors affecting potential to achieve goals and functional outcome are ability to learn/carryover information, co-morbidities, cooperation/participation level, medical prognosis, previous level of function, and severity of impairments. Patient will benefit from skilled SLP services to address above impairments and improve overall function.  REHAB POTENTIAL: Fair bilateral thalamotomy  PLAN: SLP FREQUENCY: 1-2x/week  SLP DURATION: 8 weeks  PLANNED INTERVENTIONS: Aspiration precaution training, Environmental controls, Cueing hierachy, Cognitive reorganization,  Internal/external aids, Functional tasks, SLP instruction and feedback, Compensatory strategies, and Patient/family education  Makiya Jeune B. Rutherford Nail, M.S., CCC-SLP, Mining engineer Certified Brain Injury [place]  [place] Office [phone] Ascom [phone] Fax [phone]

## 2022-04-27 ENCOUNTER — Ambulatory Visit: Payer: PPO | Admitting: Speech Pathology

## 2022-04-28 ENCOUNTER — Ambulatory Visit: Payer: PPO | Admitting: Speech Pathology

## 2022-05-01 ENCOUNTER — Telehealth: Payer: Self-pay

## 2022-05-01 ENCOUNTER — Other Ambulatory Visit: Payer: Self-pay | Admitting: Internal Medicine

## 2022-05-01 ENCOUNTER — Telehealth: Payer: Self-pay | Admitting: Internal Medicine

## 2022-05-01 NOTE — Telephone Encounter (Signed)
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

## 2022-05-01 NOTE — Telephone Encounter (Signed)
Left message for patient to call for biopsy results. 

## 2022-05-01 NOTE — Telephone Encounter (Signed)
Placed in red folder  

## 2022-05-01 NOTE — Telephone Encounter (Signed)
Pt wife came into the office to drop off an adult care form. Placed in provider folder up front

## 2022-05-02 ENCOUNTER — Ambulatory Visit: Payer: PPO | Admitting: Speech Pathology

## 2022-05-02 ENCOUNTER — Telehealth: Payer: Self-pay

## 2022-05-02 ENCOUNTER — Other Ambulatory Visit: Payer: Self-pay | Admitting: Internal Medicine

## 2022-05-02 DIAGNOSIS — J441 Chronic obstructive pulmonary disease with (acute) exacerbation: Secondary | ICD-10-CM

## 2022-05-02 MED ORDER — BUDESONIDE 0.5 MG/2ML IN SUSP: 0.5000 mg | Freq: Two times a day (BID) | RESPIRATORY_TRACT | 0 refills | Status: DC

## 2022-05-02 NOTE — Telephone Encounter (Signed)
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

## 2022-05-02 NOTE — Telephone Encounter (Signed)
Patient advised of BX results and scheduled for surgery. aw

## 2022-05-03 ENCOUNTER — Ambulatory Visit (INDEPENDENT_AMBULATORY_CARE_PROVIDER_SITE_OTHER): Payer: PPO | Admitting: Internal Medicine

## 2022-05-03 ENCOUNTER — Encounter: Payer: Self-pay | Admitting: Internal Medicine

## 2022-05-03 VITALS — BP 128/68 | HR 61 | Temp 97.5°F | Ht 74.0 in | Wt 167.6 lb

## 2022-05-03 DIAGNOSIS — E440 Moderate protein-calorie malnutrition: Secondary | ICD-10-CM | POA: Diagnosis not present

## 2022-05-03 DIAGNOSIS — F0283 Dementia in other diseases classified elsewhere, unspecified severity, with mood disturbance: Secondary | ICD-10-CM

## 2022-05-03 DIAGNOSIS — Z111 Encounter for screening for respiratory tuberculosis: Secondary | ICD-10-CM

## 2022-05-03 DIAGNOSIS — F03B11 Unspecified dementia, moderate, with agitation: Secondary | ICD-10-CM

## 2022-05-03 DIAGNOSIS — Z0279 Encounter for issue of other medical certificate: Secondary | ICD-10-CM

## 2022-05-03 DIAGNOSIS — E538 Deficiency of other specified B group vitamins: Secondary | ICD-10-CM

## 2022-05-03 DIAGNOSIS — G309 Alzheimer's disease, unspecified: Secondary | ICD-10-CM | POA: Diagnosis not present

## 2022-05-03 DIAGNOSIS — R471 Dysarthria and anarthria: Secondary | ICD-10-CM

## 2022-05-03 DIAGNOSIS — R634 Abnormal weight loss: Secondary | ICD-10-CM | POA: Diagnosis not present

## 2022-05-03 DIAGNOSIS — F02811 Dementia in other diseases classified elsewhere, unspecified severity, with agitation: Secondary | ICD-10-CM

## 2022-05-03 LAB — CBC WITH DIFFERENTIAL/PLATELET
Basophils Absolute: 0 10*3/uL (ref 0.0–0.1)
Basophils Relative: 0.5 % (ref 0.0–3.0)
Eosinophils Absolute: 0.1 10*3/uL (ref 0.0–0.7)
Eosinophils Relative: 1.5 % (ref 0.0–5.0)
HCT: 45.7 % (ref 39.0–52.0)
Hemoglobin: 15.2 g/dL (ref 13.0–17.0)
Lymphocytes Relative: 17.1 % (ref 12.0–46.0)
Lymphs Abs: 0.9 10*3/uL (ref 0.7–4.0)
MCHC: 33.2 g/dL (ref 30.0–36.0)
MCV: 93.1 fl (ref 78.0–100.0)
Monocytes Absolute: 0.4 10*3/uL (ref 0.1–1.0)
Monocytes Relative: 7.7 % (ref 3.0–12.0)
Neutro Abs: 4 10*3/uL (ref 1.4–7.7)
Neutrophils Relative %: 73.2 % (ref 43.0–77.0)
Platelets: 254 10*3/uL (ref 150.0–400.0)
RBC: 4.9 Mil/uL (ref 4.22–5.81)
RDW: 14.6 % (ref 11.5–15.5)
WBC: 5.4 10*3/uL (ref 4.0–10.5)

## 2022-05-03 LAB — B12 AND FOLATE PANEL
Folate: 5.7 ng/mL — ABNORMAL LOW (ref >5.9)
Vitamin B-12: 996 pg/mL — ABNORMAL HIGH (ref 211–911)

## 2022-05-03 LAB — TSH: TSH: 1.37 u[IU]/mL (ref 0.35–5.50)

## 2022-05-03 MED ORDER — PROPRANOLOL HCL ER 60 MG PO CP24: 60.0000 mg | ORAL_CAPSULE | Freq: Every day | ORAL | 1 refills | Status: DC

## 2022-05-03 NOTE — Progress Notes (Signed)
Subjective:  Patient ID: Theodore [name], male    DOB: June 18, 1929  Age: 89 y.o. MRN: [id number]  CC: The primary encounter diagnosis was Screening for tuberculosis. 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   HPI [name] presents for  Chief Complaint  Patient presents with   Medical Management of Chronic Issues    4 week follow up on anxiety    1) GAD:  follow up .  He is generally improved (per [name]) since last visit but has been having agitation during sleep due to disturbing dreams.  He recalls having conflict In dreams,  wife witnessed him while asleep flailing his arms . (He was dreaming that he was having  a sword fight).  He is Using alprazolam once daily,  rarely  twice daily  for relief of severe anxiety and taking paxil 20 mg daily without side effects. . Wife notes violent dreams are occurring several times per week.   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  Using propranolol  short acting prn stage fright and tremor.  Discussed change to LA  3) Dementia: did not tolerate trial of aricept last year.  Symptoms have progressed since his last thalotomy.  repeat MRI needed and referral to Dr Manuella Ghazi . [name] is planning to enroll him in a daycare program at [facility] to increase his socialization and activities and plans to start with one day per week.    Outpatient Medications Prior to Visit  Medication Sig Dispense Refill   ALPRAZolam (XANAX) 0.25 MG tablet Take 1 tablet (0.25 mg total) by mouth 2 (two) times daily as needed for anxiety or sleep. 60 tablet 2   ascorbic acid (VITAMIN C) 500 MG tablet Take 500 mg by mouth daily.     Cholecalciferol (VITAMIN D3) 50 MCG (2000 UT) TABS Take 1 tablet by mouth daily.      cycloSPORINE (RESTASIS) 0.05 % ophthalmic emulsion Place 1 drop into both eyes 2 (two) times daily.     ezetimibe (ZETIA) 10 MG tablet TAKE 1 TABLET BY MOUTH EVERY DAY 60 tablet 0   fluticasone (FLONASE) 50 MCG/ACT nasal spray SPRAY 2 SPRAYS INTO EACH NOSTRIL EVERY DAY 48 mL 1   levothyroxine (SYNTHROID) 100 MCG tablet TAKE 1 TABLET BY MOUTH EVERY DAY BEFORE BREAKFAST 90 tablet 0   magnesium oxide (MAG-OX) 400 MG tablet Take 400 mg by mouth daily.     PARoxetine (PAXIL) 20 MG tablet Take 1 tablet (20 mg total) by mouth daily. 30 tablet 5   rosuvastatin (CRESTOR) 10 MG tablet Take 1 tablet (10 mg total) by mouth every other day. 45 tablet 0   Saccharomyces boulardii (PROBIOTIC) 250 MG CAPS Take 1 capsule by mouth daily at 12 noon.     testosterone cypionate (DEPOTESTOSTERONE CYPIONATE) 200 MG/ML injection INJECT 0.5 ML (100 MG TOTAL) INTO THE MUSCLE EVERY 14 DAYS. 1 mL 2   vitamin B-12 (CYANOCOBALAMIN) 500 MCG tablet Take 1 tablet by mouth daily.     propranolol (INDERAL) 10 MG tablet Take 1 tablet (10 mg total) by mouth 3 (three) times daily. As needed for tremor and stage fright 30 tablet 0   albuterol (PROVENTIL) (2.5 MG/3ML) 0.083% nebulizer solution Take 3 mLs (2.5 mg total) by nebulization every 6 (six) hours as needed  for wheezing or shortness of breath. (Patient not taking: Reported on 05/03/2022) 60 mL 0   budesonide (PULMICORT) 0.5 MG/2ML nebulizer solution Take 2 mLs (0.5 mg total) by nebulization in the morning and at bedtime for 7 days. (Patient not taking: Reported on 05/03/2022) 28 mL 0   No facility-administered medications prior to visit.    Review of Systems;  Patient denies headache, fevers, malaise, unintentional weight loss, skin rash, eye pain, sinus congestion and sinus pain, sore throat, dysphagia,  hemoptysis , cough, dyspnea, wheezing, chest pain, palpitations, orthopnea, edema, abdominal pain, nausea, melena, diarrhea, constipation, flank pain, dysuria, hematuria, urinary   Frequency, nocturia, numbness, tingling, seizures,  Focal weakness, Loss of consciousness,  insomnia, , and suicidal ideation.      Objective:  BP 128/68   Pulse 61   Temp (!) 97.5 F (36.4 C) (Oral)   Ht 6' 2"$  (1.88 m)   Wt 167 lb 9.6 oz (76 kg)   SpO2 96%   BMI 21.52 kg/m   BP Readings from Last 3 Encounters:  05/03/22 128/68  04/25/22 129/65  04/04/22 (!) 142/66    Wt Readings from Last 3 Encounters:  05/03/22 167 lb 9.6 oz (76 kg)  04/04/22 168 lb 9.6 oz (76.5 kg)  03/28/22 172 lb 12.8 oz (78.4 kg)    Physical Exam Vitals reviewed.  Constitutional:      General: He is not in acute distress.    Appearance: Normal appearance. He is normal weight. He is not ill-appearing, toxic-appearing or diaphoretic.  HENT:     Head: Normocephalic.  Eyes:     General: No scleral icterus.       Right eye: No discharge.        Left eye: No discharge.     Conjunctiva/sclera: Conjunctivae normal.  Cardiovascular:     Rate and Rhythm: Normal rate and regular rhythm.     Heart sounds: Normal heart sounds.  Pulmonary:     Effort: Pulmonary effort is normal. No respiratory distress.     Breath sounds: Normal breath sounds.  Musculoskeletal:        General: Normal range of motion.     Cervical back: Normal range of motion.  Skin:    General: Skin is warm and dry.  Neurological:     General: No focal deficit present.     Mental Status: He is alert and oriented to person, place, and time. Mental status is at baseline.  Psychiatric:        Mood and Affect: Mood normal.        Behavior: Behavior normal.        Thought Content: Thought content normal.        Judgment: Judgment normal.    Lab Results  Component Value Date   HGBA1C 4.8 01/17/2021   HGBA1C 4.7 05/28/2018    Lab Results  Component Value Date   CREATININE 1.19 01/23/2022   CREATININE 1.41 01/04/2022   CREATININE 1.14 12/05/2021    Lab Results  Component Value Date   WBC 6.6 01/04/2022   HGB 12.9 (L)  01/04/2022   HCT 38.5 (L) 01/04/2022   PLT 275.0 01/04/2022   GLUCOSE 106 (H) 01/23/2022   CHOL 197 09/21/2020   TRIG 70.0 09/21/2020   HDL 62.00 09/21/2020   LDLCALC 121 (H) 09/21/2020   ALT 17 01/04/2022   AST 19 01/04/2022   NA 137 01/23/2022   K 4.1 01/23/2022   CL 103 01/23/2022   CREATININE 1.19 01/23/2022  BUN 28 (H) 01/23/2022   CO2 28 01/23/2022   TSH 0.97 08/26/2021   PSA 21.68 (H) 02/11/2021   INR 1.4 (H) 11/29/2021   HGBA1C 4.8 01/17/2021    No results found.  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 [name] He did not tolerate trial of aricept . [name] is planning to enroll hin in [facility] program .  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  He is receiving speech therapy with Happi Overton    Protein-calorie malnutrition, moderate ([place]) Assessment & Plan:  Decreased appetite secondary to severe anxiety addressed .  He has amended his diet with increased  protein and fat intake but continues to lose weight.  He did not tolerat previous trial of remeron    Other orders -     Propranolol HCl ER; Take 1 capsule (60 mg total) by mouth daily.  Dispense: 90 capsule; Refill: 1     I provided 40  minutes of face-to-face time during this encounter reviewing patient's last visit with me, patient's   most recent visit with cardiology,  nephrology,  and neurology,  recent surgical and non surgical procedures, previous  labs and imaging studies, counseling on currently addressed issues,  and post visit ordering to diagnostics and therapeutics .   Follow-up: No follow-ups on file.   [name], MD

## 2022-05-03 NOTE — Assessment & Plan Note (Addendum)
Advised to return to Dr  Manuella Ghazi after Repeat MRI of brain is done. [name] He did not tolerate trial of aricept . [name] is planning to enroll hin in [facility] program .  Increase paxil to 30 mg  and prn use of alprazolam  0.25  mg

## 2022-05-03 NOTE — Assessment & Plan Note (Signed)
Decreased appetite secondary to severe anxiety addressed .  He has amended his diet with increased  protein and fat intake but continues to lose weight.  He did not tolerat previous trial of remeron

## 2022-05-03 NOTE — Patient Instructions (Addendum)
1) We are changing the propanolol to a long acting version (LA)  to take once daily for treor.   Save the short acting propranolol for performances if needed   2)  Ok to increase paxil to 30 mg as a trial for the anxiety and bad dreams.  Let me know if tolerated so I can change the dose on file at your phramacy   If the vivid dreams continue ,   You might want to try adding  Relaxium for insomnia  (as seen on TV commercials) . It is available through [employer] and contains all natural supplements:  Melatonin 5 mg  Chamomile 25 mg Passionflower extract 75 mg GABA 100 mg Ashwaganda extract 125 mg Magnesium citrate, glycinate, oxide (100 mg)  L tryptophan 500 mg Valerest (proprietary  ingredient ; probably valeria root extract)    3) MRI Brain and referral to Dr Manuella Ghazi in progress

## 2022-05-03 NOTE — Assessment & Plan Note (Signed)
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  He is receiving speech therapy with Happi Rutherford Nail

## 2022-05-04 ENCOUNTER — Ambulatory Visit: Payer: PPO | Admitting: Speech Pathology

## 2022-05-04 DIAGNOSIS — R41841 Cognitive communication deficit: Secondary | ICD-10-CM

## 2022-05-04 DIAGNOSIS — G309 Alzheimer's disease, unspecified: Secondary | ICD-10-CM

## 2022-05-04 LAB — COMPREHENSIVE METABOLIC PANEL
ALT: 16 U/L (ref 0–53)
AST: 16 U/L (ref 0–37)
Albumin: 4.2 g/dL (ref 3.5–5.2)
Alkaline Phosphatase: 43 U/L (ref 39–117)
BUN: 29 mg/dL — ABNORMAL HIGH (ref 6–23)
CO2: 27 mEq/L (ref 19–32)
Calcium: 9.7 mg/dL (ref 8.4–10.5)
Chloride: 104 mEq/L (ref 96–112)
Creatinine, Ser: 1.25 mg/dL (ref 0.40–1.50)
GFR: 50.55 mL/min — ABNORMAL LOW (ref >60.00)
Glucose, Bld: 95 mg/dL (ref 70–99)
Potassium: 4.7 mEq/L (ref 3.5–5.1)
Sodium: 142 mEq/L (ref 135–145)
Total Bilirubin: 1 mg/dL (ref 0.2–1.2)
Total Protein: 6.3 g/dL (ref 6.0–8.3)

## 2022-05-04 NOTE — Therapy (Signed)
OUTPATIENT SPEECH LANGUAGE PATHOLOGY TREATMENT NOTE   Patient Name: Theodore [name] MRN: [id number] DOB:Jul 07, 1929, 89 y.o., male Today's Date: 05/04/2022   PCP: [name], MD REFERRING PROVIDER: [name], MD   END OF SESSION:   End of Session - 05/04/22 1435     Visit Number 11    Number of Visits 25    Date for SLP Re-Evaluation 05/17/22    Authorization Type [employer]    Progress Note Due on Visit 10    SLP Start Time 1400    SLP Stop Time  1500    SLP Time Calculation (min) 60 min    Activity Tolerance Patient tolerated treatment well              Past Medical History:  Diagnosis Date   Actinic keratosis    Arthritis    knees   Asymptomatic Sinus bradycardia    a. 05/2016 Zio: Avg HR 61 (41-167).   Benign essential tremor    Cancer ([place]) 2002   melanoma right ear   Colon polyp    COPD (chronic obstructive pulmonary disease) ([place])    pt said he believes it is a misdiagnosis    Coronary artery calcification seen on CT scan    a. 06/2016 CTA Chest: cor Ca2+; b. 05/2017 MV: EF >65%. No ischemia/infarct.   Depression    Dysplastic nevus 04/25/2022   neck - anterior, severe, needs exc (check location at appt)   History of echocardiogram    a. 04/2016 Echo: EF 60-65%, mild conc LVH. Nl PASP.   History of kidney stones    Hx of dysplastic nevus 05/07/2017   L anterior shoulder, severe   Hx of dysplastic nevus 09/06/2020   R lower sternum, moderate atypia   Hypothyroidism    Hypothyroidism    Laceration of right lower extremity    Melanoma ([place]) 1990's per pt   R ear   Melanoma in situ ([place]) 01/12/2014   left jaw   MSSA infection, non-invasive    Palpitations    a. 05/2016 Zio: Avg HR 61 (41-167). 9 SVT runs (fastest 167 - 5 beats; longest 8 beats - 101 bpm). Rare PACs/PVCs.   PVC's (premature ventricular contractions)    Rhinitis    Sepsis due to cellulitis ([place]) 11/28/2021   Severe sepsis ([place]) 11/28/2021   Wears dentures    partial  upper and lower   Wears hearing aid in both ears    Past Surgical History:  Procedure Laterality Date   ADENOIDECTOMY     CATARACT EXTRACTION W/PHACO Left 10/22/2017   Procedure: CATARACT EXTRACTION PHACO AND INTRAOCULAR LENS PLACEMENT ([place])  LEFT;  Surgeon: Eulogio Bear, MD;  Location: [facility];  Service: Ophthalmology;  Laterality: Left;   CATARACT EXTRACTION W/PHACO Right 11/20/2017   Procedure: CATARACT EXTRACTION PHACO AND INTRAOCULAR LENS PLACEMENT (IOC) RIGHT;  Surgeon: Eulogio Bear, MD;  Location: [facility];  Service: Ophthalmology;  Laterality: Right;   ESOPHAGOGASTRODUODENOSCOPY (EGD) WITH PROPOFOL N/A 10/31/2016   Procedure: ESOPHAGOGASTRODUODENOSCOPY (EGD) WITH PROPOFOL;  Surgeon: Lollie Sails, MD;  Location: [facility] ENDOSCOPY;  Service: Endoscopy;  Laterality: N/A;   I & D EXTREMITY Right 11/29/2021   Procedure: IRRIGATION AND DEBRIDEMENT EXTREMITY;  Surgeon: Olean Ree, MD;  Location: [facility] ORS;  Service: General;  Laterality: Right;   JOINT REPLACEMENT Right 2003   knee   knee meniscus repair Right    MELANOMA EXCISION Right    ear. Followed by Dr. 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 ([place]) 11/28/2021   Dyslipidemia 11/28/2021   GERD without esophagitis 11/28/2021   Proximal muscle weakness 08/27/2021   Speech disturbance 08/26/2021   Adverse drug reaction, initial encounter 05/04/2021   Lumbar radiculopathy 04/25/2021   Thoracic aortic atherosclerosis ([place]) 01/17/2021   Anxiety in acute stress reaction 12/07/2020   Mild cognitive impairment with memory loss 11/30/2020   Elevated PSA, between 10 and less than 20 ng/ml 11/30/2020   Chronic kidney disease, stage 3b ([place]) 11/10/2020   Benign hypertensive kidney disease with chronic  kidney disease 11/10/2020   COVID-19 virus infection 04/06/2020   Acquired trigger finger of left middle finger 08/07/2019   Carpal tunnel syndrome of right wrist 07/10/2019   Ulnar neuropathy 06/05/2019   Disorder of bursae of shoulder region 01/29/2019   Localized, primary osteoarthritis of shoulder region 01/29/2019   Localized, secondary osteoarthritis of the shoulder region 01/29/2019   Osteoarthritis of knee 01/29/2019   Shoulder joint pain 01/29/2019   Chronic obstructive pulmonary disease ([place]) 09/24/2018   Cervical radiculopathy 08/14/2018   PVC (premature ventricular contraction) 03/21/2018   PAD (peripheral artery disease) ([facility])    Anterior knee pain, left 12/26/2015   Hypothyroidism 07/04/2014   Nonallergic vasomotor rhinitis 07/01/2014   Hypertrophy of prostate with urinary obstruction and other lower urinary tract symptoms (LUTS) 08/08/2013   Low serum testosterone level 04/28/2013   Actinic keratosis 05/10/2010   History of malignant melanoma of skin 05/10/2010    ONSET DATE: date of referrals 02/01/2022 and 02/22/2022    REFERRING DIAG: R47.1 (ICD-10-CM) - Dysarthria R41.89 (ICD-10-CM) - Cognitive decline     PERTINENT HISTORY: Pt with recent course of Outpatient ST services same deficits (08/26/2021 and 09/07/2021).  [name]      07/21/2019 - pt underwent MRI guided focused ultrasound thalamotomy LEFT sided for treatment of essential tremor of RIGHT arm    07/30/2021 - pt underwent MRI guided focused ultrasound thalamotomy RIGHT sided for treatment of essential tremor of LEFT arm        DIAGNOSTIC FINDINGS:    MRi 07/05/2021 consistent with recent MR guided focus ultrasound ablation in the right subthalamic region A focus of diffusion restriction right subthalamic region measures 9 x 6 mm     MBSS 2017    MBSS 11/04/2021 Pt presents with overall adequate oropharyngeal abilities when consuming puree, soft solids, thin liquids via spoon and cup as well as whole  barium tablet with thin liquids. Pt's swallow function doesn't appear any different than documented in his previous MBSS (2017) which stated, "Thin liquid- small amount of laryngeal penetration with rapid successive swallowing maneuvers. With single swallowing maneuvers no laryngeal penetration is observed. As well in this previous study, pt continues to demonstrate penetration with trace aspiration when consuming successive swallows. Pt demonstrated understanding need to take single sips as he stated that he doesn't consume consecutive sips at baseline. At this time, pt presents with adequate oropharyngeal abilities and reduced risk of aspiration when consuming regular diet with thin liquids via single cup sips and medicine whole with thin liquids. Skilled ST intervention is not indicated.   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 No  PATIENT GOALS: to get better  OBJECTIVE:   TODAY'S TREATMENT:  Skilled treatment session focused on pt's cognitive communication goals. SLP facilitated session by providing the following interventions:  Pt states that he has largely been pain free from his oral surgery. SLP further facilitated session by asking his wife to complete the Quick Dementia Rating System.   Quick Dementia Rating System (QDRS)  The QDRS is a rapid dementia staging tool that provides a brief but valid and reliable assessment of whether a problem is present, and if present how severe it is. The QDRS has 10 categories, each with 5 options that characterize changes in the patient's cognitive and functional abilities. The QDRS is scored on a continuous scale with a range of 0-30. Higher scores suggest more impairment. The QDRS contains two subscales that are designed to see whether cognitive  (questions 1, 2, 3 and 8) or behavioral (questions 4, 5, 6, 7, 9, 10) symptoms are the predominant features. Scores in the impaired range indicate a need for further assessment to establish a formal diagnosis. Scores in the "normal" range suggest that a dementing disorder is unlikely, but a very early disease process cannot be ruled out. Ore advanced assessment may be warranted in cases where other objective evidence of impairment exists.  QDRS scores differentiate with the following cut-points:               Normal                                     0-1             Mild Cognitive Impairment      2-5             Mild Dementia                         6-12             Moderate Dementia                13-20             Severe Dementia                    20-30  The following descriptions characterize changes in the patient's cognitive and functional abilities. You are asked to compare the patient now to how they used to be-they key feature is change. Choose one answer each memory category that best fits the patient.   Memory and Recall  2-Moderate to severe memory loss; only highly learned information remembered; new information rapidly forgotten.  2. Orientation 1-Mild to moderate difficulty keeping track of time and sequence of events; forgets month or year; oriented to familiar places but gets confused outside of familiar areas; gets lost or wanders.  3.Decision Making and Problem Solving Abilities 3-Unable to make decisions or solve problems; others make nearly all decisions for patient.  4. Activities Outside the Home  2-No pretense of independent function outside the house; appears well enough to be taken to activities outside the family home but generally needs to be accompanied.  5. Function at Home and [place] 2-Only simple chores preserved, very restricted interest in hobbies which are poorly maintained.  6. Toileting and Personal Hygiene  2-Requires some assistance in dressing,  hygiene, keeping of personal items; occasionally incontinent.  7. Behavior and Personality Changes  2-Moderate behavior or personality changes, affects interactions with others; may be avoided by friends, neighbors, or distant relatives.  8. Language and Communication Abilities  1-Moderate word finding difficulty in speech, cannot name objects, marked reduction in word production; reduced comprehension, conversation, writing and/or reading.  9. Mood  3-Severe issues with sadness, depression, anxiety, nervousness or los of interest/motivation.  10. Attention and Concentration  2-Significant portion of the day is spent sleeping, not paying attention to environment, when having a conversation may say things that illogical or not consistent with topic.  Cognitive Subtotal (Questions 1, 2, 3, 8): 7 Behavioral Subtotal (Questions 4, 5, 6, 7, 9, 10): 13  Total QDRS Score: 20 moderate dementia 13-20 to severe dementia 20-30).    Pt with SLP completed the [place] of Daily Living (IADL) Scale The [place] Instrumental Activities of Daily Living (IADL) Scale  The IADL Scale is an appropriate instrument to assess independent living skills ([place]). These skills are considered more complex than the basic activities of daily living. The instrument is most useful for identifying how a person is functioning at the present time, and to identify improvement or deterioration over time. There are eight domains of function measured (see below). Clients are scored according to their highest of levels of functioning in each category.   A. Ability to Use Telephone  1-Dials a few well-known numbers.  B. Shopping 0-Completely unable to shop.  C. Food Preparation  0-Heats, serves and prepares meals, or prepares meals, or prepares meals but does not maintain adequate diet.  D. Housekeeping  0-Does not participate in any housekeeping tasks.  E. Laundry  0-All laundry must be done by  others.  F. Mode of Transportation  0-Does not travel at all.  G. Responsibility for Own Medications  0-Is not capable of dispensing own medication.  H. Ability to [employer]  0-Incapable of handling money.  Total Score: 1 of 5 with (0- low function, dependent and 8- high function, independent    PATIENT EDUCATION: Education details: see above Person educated: Patient and Spouse Education method: Explanation, Demonstration, Verbal cues, and Handouts Education comprehension: needs further education  HOME EXERCISE PROGRAM:  Recommend pt's wife handle all phone calls related to appts or financial issues as pt has difficulty problem solving and remembering information  GOALS: Goals reviewed with patient? Yes  SHORT TERM GOALS: Target date: 10 sessions  Patient will demonstrate understanding of functional cognitive activities for home maintenance program with moderate assistance Baseline: Maximal Goal status: ONGOING   2.  Given moderate assistance, patient will demonstrate knowledge of appropriate activities to support cognitive and  language function outside of ST with assistance from family.  Baseline: Maximal Goal status: ONGOING   3.  Patient will report engagement in cognitive activities outside of ST 4/7 days.  Baseline:  Goal status: ONGOING   4.  Pt will verbalize and demonstrate how to implement 1 memory and attention strategies to aid daily functioning with moderate A over 2 sessions  Baseline: currently not using any strategies Goal status: ONGOING     LONG TERM GOALS: Target date: 05/17/2022 Patient will demonstrate understanding of functional cognitive activities for home maintenance program with minimal assistance Baseline: maximal Goal status: ONGOING  ASSESSMENT:  CLINICAL IMPRESSION: Pt continues to be responsive to recommendations and pt's wife is very receptive to additional ways in which to help pt such as managing medicine, handling calls to  doctor's offices.  OBJECTIVE IMPAIRMENTS include attention, memory, awareness, executive functioning, dysarthria, dysphagia, and motor speech . These impairments are limiting patient from managing medications, managing appointments, managing finances, household responsibilities, ADLs/IADLs, effectively communicating at home and in community, and safety when swallowing. Factors affecting potential to achieve goals and functional outcome are ability to learn/carryover information, co-morbidities, cooperation/participation level, medical prognosis, previous level of function, and severity of impairments. Patient will benefit from skilled SLP services to address above impairments and improve overall function.  REHAB POTENTIAL: Fair bilateral thalamotomy  PLAN: SLP FREQUENCY: 1-2x/week  SLP DURATION: 8 weeks  PLANNED INTERVENTIONS: Aspiration precaution training, Environmental controls, Cueing hierachy, Cognitive reorganization, Internal/external aids, Functional tasks, SLP instruction and feedback, Compensatory strategies, and Patient/family education  Aaronmichael Brumbaugh B. Rutherford Nail, M.S., CCC-SLP, Mining engineer Certified Brain Injury [place]  [place] Office [phone] Ascom [phone] Fax [phone]

## 2022-05-06 ENCOUNTER — Other Ambulatory Visit: Payer: Self-pay | Admitting: Cardiovascular Disease

## 2022-05-08 ENCOUNTER — Ambulatory Visit: Payer: PPO | Admitting: Speech Pathology

## 2022-05-08 DIAGNOSIS — R41841 Cognitive communication deficit: Secondary | ICD-10-CM | POA: Diagnosis not present

## 2022-05-08 DIAGNOSIS — F0283 Dementia in other diseases classified elsewhere, unspecified severity, with mood disturbance: Secondary | ICD-10-CM

## 2022-05-08 NOTE — Telephone Encounter (Signed)
Resulted and written in on form. Pt's wife is aware and stated that she will be by today to pick up the paperwork.

## 2022-05-09 ENCOUNTER — Encounter: Payer: PPO | Admitting: Speech Pathology

## 2022-05-09 ENCOUNTER — Ambulatory Visit: Payer: PPO | Admitting: Cardiology

## 2022-05-09 LAB — QUANTIFERON-TB GOLD PLUS
Mitogen-NIL: 8.68 IU/mL
NIL: 0.01 IU/mL
QuantiFERON-TB Gold Plus: NEGATIVE
TB1-NIL: 0.13 IU/mL
TB2-NIL: 0.06 IU/mL

## 2022-05-09 LAB — METHYLMALONIC ACID, SERUM

## 2022-05-10 ENCOUNTER — Ambulatory Visit: Payer: PPO | Admitting: Nurse Practitioner

## 2022-05-10 NOTE — Therapy (Signed)
OUTPATIENT SPEECH LANGUAGE PATHOLOGY TREATMENT NOTE   Patient Name: Theodore [name] MRN: [id number] DOB:November 20, 1929, 89 y.o., male Today's Date: 05/10/2022   PCP: [name], MD REFERRING PROVIDER: [name], MD   END OF SESSION:   End of Session - 05/10/22 1201     Visit Number 12    Number of Visits 25    Date for SLP Re-Evaluation 05/17/22    Authorization Type [employer]    Progress Note Due on Visit 10    SLP Start Time 1100    SLP Stop Time  1200    SLP Time Calculation (min) 60 min    Activity Tolerance Patient tolerated treatment well              Past Medical History:  Diagnosis Date   Actinic keratosis    Arthritis    knees   Asymptomatic Sinus bradycardia    a. 05/2016 Zio: Avg HR 61 (41-167).   Benign essential tremor    Cancer ([place]) 2002   melanoma right ear   Colon polyp    COPD (chronic obstructive pulmonary disease) ([place])    pt said he believes it is a misdiagnosis    Coronary artery calcification seen on CT scan    a. 06/2016 CTA Chest: cor Ca2+; b. 05/2017 MV: EF >65%. No ischemia/infarct.   Depression    Dysplastic nevus 04/25/2022   neck - anterior, severe, needs exc (check location at appt)   History of echocardiogram    a. 04/2016 Echo: EF 60-65%, mild conc LVH. Nl PASP.   History of kidney stones    Hx of dysplastic nevus 05/07/2017   L anterior shoulder, severe   Hx of dysplastic nevus 09/06/2020   R lower sternum, moderate atypia   Hypothyroidism    Hypothyroidism    Laceration of right lower extremity    Melanoma ([place]) 1990's per pt   R ear   Melanoma in situ ([place]) 01/12/2014   left jaw   MSSA infection, non-invasive    Palpitations    a. 05/2016 Zio: Avg HR 61 (41-167). 9 SVT runs (fastest 167 - 5 beats; longest 8 beats - 101 bpm). Rare PACs/PVCs.   PVC's (premature ventricular contractions)    Rhinitis    Sepsis due to cellulitis ([place]) 11/28/2021   Severe sepsis ([place]) 11/28/2021   Wears dentures    partial  upper and lower   Wears hearing aid in both ears    Past Surgical History:  Procedure Laterality Date   ADENOIDECTOMY     CATARACT EXTRACTION W/PHACO Left 10/22/2017   Procedure: CATARACT EXTRACTION PHACO AND INTRAOCULAR LENS PLACEMENT ([place])  LEFT;  Surgeon: Eulogio Bear, MD;  Location: [place];  Service: Ophthalmology;  Laterality: Left;   CATARACT EXTRACTION W/PHACO Right 11/20/2017   Procedure: CATARACT EXTRACTION PHACO AND INTRAOCULAR LENS PLACEMENT (IOC) RIGHT;  Surgeon: Eulogio Bear, MD;  Location: [place];  Service: Ophthalmology;  Laterality: Right;   ESOPHAGOGASTRODUODENOSCOPY (EGD) WITH PROPOFOL N/A 10/31/2016   Procedure: ESOPHAGOGASTRODUODENOSCOPY (EGD) WITH PROPOFOL;  Surgeon: Lollie Sails, MD;  Location: [facility] ENDOSCOPY;  Service: Endoscopy;  Laterality: N/A;   I & D EXTREMITY Right 11/29/2021   Procedure: IRRIGATION AND DEBRIDEMENT EXTREMITY;  Surgeon: Olean Ree, MD;  Location: [facility] ORS;  Service: General;  Laterality: Right;   JOINT REPLACEMENT Right 2003   knee   knee meniscus repair Right    MELANOMA EXCISION Right    ear. Followed by Dr. Nehemiah Massed  PATELLECTOMY Bilateral    TONSILLECTOMY     Patient Active Problem List   Diagnosis Date Noted   Sialorrhea 02/02/2022   Protein-calorie malnutrition, moderate ([place]) 11/29/2021   Dementia in Alzheimer's disease with depression ([place]) 11/28/2021   Dyslipidemia 11/28/2021   GERD without esophagitis 11/28/2021   Proximal muscle weakness 08/27/2021   Speech disturbance 08/26/2021   Adverse drug reaction, initial encounter 05/04/2021   Lumbar radiculopathy 04/25/2021   Thoracic aortic atherosclerosis ([place]) 01/17/2021   Anxiety in acute stress reaction 12/07/2020   Mild cognitive impairment with memory loss 11/30/2020   Elevated PSA, between 10 and less than 20 ng/ml 11/30/2020   Chronic kidney disease, stage 3b ([place]) 11/10/2020   Benign hypertensive kidney disease with chronic  kidney disease 11/10/2020   COVID-19 virus infection 04/06/2020   Acquired trigger finger of left middle finger 08/07/2019   Carpal tunnel syndrome of right wrist 07/10/2019   Ulnar neuropathy 06/05/2019   Disorder of bursae of shoulder region 01/29/2019   Localized, primary osteoarthritis of shoulder region 01/29/2019   Localized, secondary osteoarthritis of the shoulder region 01/29/2019   Osteoarthritis of knee 01/29/2019   Shoulder joint pain 01/29/2019   Chronic obstructive pulmonary disease ([place]) 09/24/2018   Cervical radiculopathy 08/14/2018   PVC (premature ventricular contraction) 03/21/2018   PAD (peripheral artery disease) ([facility])    Anterior knee pain, left 12/26/2015   Hypothyroidism 07/04/2014   Nonallergic vasomotor rhinitis 07/01/2014   Hypertrophy of prostate with urinary obstruction and other lower urinary tract symptoms (LUTS) 08/08/2013   Low serum testosterone level 04/28/2013   Actinic keratosis 05/10/2010   History of malignant melanoma of skin 05/10/2010    ONSET DATE: date of referrals 02/01/2022 and 02/22/2022    REFERRING DIAG: R47.1 (ICD-10-CM) - Dysarthria R41.89 (ICD-10-CM) - Cognitive decline     PERTINENT HISTORY: Pt with recent course of Outpatient ST services same deficits (08/26/2021 and 09/07/2021).  [name]      07/21/2019 - pt underwent MRI guided focused ultrasound thalamotomy LEFT sided for treatment of essential tremor of RIGHT arm    07/30/2021 - pt underwent MRI guided focused ultrasound thalamotomy RIGHT sided for treatment of essential tremor of LEFT arm        DIAGNOSTIC FINDINGS:    MRi 07/05/2021 consistent with recent MR guided focus ultrasound ablation in the right subthalamic region A focus of diffusion restriction right subthalamic region measures 9 x 6 mm     MBSS 2017    MBSS 11/04/2021 Pt presents with overall adequate oropharyngeal abilities when consuming puree, soft solids, thin liquids via spoon and cup as well as whole  barium tablet with thin liquids. Pt's swallow function doesn't appear any different than documented in his previous MBSS (2017) which stated, "Thin liquid- small amount of laryngeal penetration with rapid successive swallowing maneuvers. With single swallowing maneuvers no laryngeal penetration is observed. As well in this previous study, pt continues to demonstrate penetration with trace aspiration when consuming successive swallows. Pt demonstrated understanding need to take single sips as he stated that he doesn't consume consecutive sips at baseline. At this time, pt presents with adequate oropharyngeal abilities and reduced risk of aspiration when consuming regular diet with thin liquids via single cup sips and medicine whole with thin liquids. Skilled ST intervention is not indicated.   THERAPY DIAG:  Cognitive communication deficit  Dementia in Alzheimer's disease with depression ([place])  Rationale for Evaluation and Treatment Rehabilitation  SUBJECTIVE:   Pt accompanied by: significant other  PAIN:  Are you having pain? No  PATIENT GOALS: to get better  OBJECTIVE:   TODAY'S TREATMENT:  Skilled treatment session focused on pt's cognitive communication goals. SLP facilitated session by providing the following interventions:  Pt was perseverative on previously discussed topics. With moderate faded to minimal assistance he was able to redirect. While he continued to be perseverative, it was progress that he could be redirected. SLP provided demonstration to pt's wife on ways to redirect him.    PATIENT EDUCATION: Education details: see above Person educated: Patient and Spouse Education method: Consulting civil engineer, Media planner, Verbal cues, and Handouts Education comprehension: needs further education  HOME EXERCISE PROGRAM:  Recommend pt's wife handle all phone calls related to appts or financial issues as pt has difficulty problem solving and remembering information  GOALS: Goals  reviewed with patient? Yes  SHORT TERM GOALS: Target date: 10 sessions  Patient will demonstrate understanding of functional cognitive activities for home maintenance program with moderate assistance Baseline: Maximal Goal status: ONGOING   2.  Given moderate assistance, patient will demonstrate knowledge of appropriate activities to support cognitive and  language function outside of ST with assistance from family.  Baseline: Maximal Goal status: ONGOING   3.  Patient will report engagement in cognitive activities outside of ST 4/7 days.  Baseline:  Goal status: ONGOING   4.  Pt will verbalize and demonstrate how to implement 1 memory and attention strategies to aid daily functioning with moderate A over 2 sessions  Baseline: currently not using any strategies Goal status: ONGOING     LONG TERM GOALS: Target date: 05/17/2022 Patient will demonstrate understanding of functional cognitive activities for home maintenance program with minimal assistance Baseline: maximal Goal status: ONGOING  ASSESSMENT:  CLINICAL IMPRESSION: Pt continues to be responsive to recommendations and pt's wife is very receptive to additional ways in which to help pt such as managing medicine, handling calls to doctor's offices.   OBJECTIVE IMPAIRMENTS include attention, memory, awareness, executive functioning, dysarthria, dysphagia, and motor speech . These impairments are limiting patient from managing medications, managing appointments, managing finances, household responsibilities, ADLs/IADLs, effectively communicating at home and in community, and safety when swallowing. Factors affecting potential to achieve goals and functional outcome are ability to learn/carryover information, co-morbidities, cooperation/participation level, medical prognosis, previous level of function, and severity of impairments. Patient will benefit from skilled SLP services to address above impairments and improve overall  function.  REHAB POTENTIAL: Fair bilateral thalamotomy  PLAN: SLP FREQUENCY: 1-2x/week  SLP DURATION: 8 weeks  PLANNED INTERVENTIONS: Aspiration precaution training, Environmental controls, Cueing hierachy, Cognitive reorganization, Internal/external aids, Functional tasks, SLP instruction and feedback, Compensatory strategies, and Patient/family education  Ransom Nickson B. Rutherford Nail, M.S., CCC-SLP, Mining engineer Certified Brain Injury [place]  [place] Office [phone] Ascom [phone] Fax [phone]

## 2022-05-11 ENCOUNTER — Ambulatory Visit: Payer: PPO | Admitting: Speech Pathology

## 2022-05-11 ENCOUNTER — Telehealth: Payer: Self-pay | Admitting: Cardiovascular Disease

## 2022-05-11 DIAGNOSIS — G309 Alzheimer's disease, unspecified: Secondary | ICD-10-CM

## 2022-05-11 DIAGNOSIS — R41841 Cognitive communication deficit: Secondary | ICD-10-CM | POA: Diagnosis not present

## 2022-05-11 MED ORDER — EZETIMIBE 10 MG PO TABS: 10.0000 mg | ORAL_TABLET | Freq: Every day | ORAL | 0 refills | Status: DC

## 2022-05-11 NOTE — Therapy (Signed)
OUTPATIENT SPEECH LANGUAGE PATHOLOGY TREATMENT NOTE   Patient Name: Theodore [name] MRN: [id number] DOB:April 27, 1929, 89 y.o., male Today's Date: 05/11/2022   PCP: [name], MD REFERRING PROVIDER: [name], MD   END OF SESSION:   End of Session - 05/11/22 0910     Visit Number 13    Number of Visits 25    Date for SLP Re-Evaluation 05/17/22    Authorization Type [employer] P[employer]    Progress Note Due on Visit 10    SLP Start Time 0900    SLP Stop Time  1000    SLP Time Calculation (min) 60 min    Activity Tolerance Patient tolerated treatment well              Past Medical History:  Diagnosis Date   Actinic keratosis    Arthritis    knees   Asymptomatic Sinus bradycardia    a. 05/2016 Zio: Avg HR 61 (41-167).   Benign essential tremor    Cancer ([place]) 2002   melanoma right ear   Colon polyp    COPD (chronic obstructive pulmonary disease) ([place])    pt said he believes it is a misdiagnosis    Coronary artery calcification seen on CT scan    a. 06/2016 CTA Chest: cor Ca2+; b. 05/2017 MV: EF >65%. No ischemia/infarct.   Depression    Dysplastic nevus 04/25/2022   neck - anterior, severe, needs exc (check location at appt)   History of echocardiogram    a. 04/2016 Echo: EF 60-65%, mild conc LVH. Nl PASP.   History of kidney stones    Hx of dysplastic nevus 05/07/2017   L anterior shoulder, severe   Hx of dysplastic nevus 09/06/2020   R lower sternum, moderate atypia   Hypothyroidism    Hypothyroidism    Laceration of right lower extremity    Melanoma ([place]) 1990's per pt   R ear   Melanoma in situ ([place]) 01/12/2014   left jaw   MSSA infection, non-invasive    Palpitations    a. 05/2016 Zio: Avg HR 61 (41-167). 9 SVT runs (fastest 167 - 5 beats; longest 8 beats - 101 bpm). Rare PACs/PVCs.   PVC's (premature ventricular contractions)    Rhinitis    Sepsis due to cellulitis ([place]) 11/28/2021   Severe sepsis ([place]) 11/28/2021   Wears dentures    partial  upper and lower   Wears hearing aid in both ears    Past Surgical History:  Procedure Laterality Date   ADENOIDECTOMY     CATARACT EXTRACTION W/PHACO Left 10/22/2017   Procedure: CATARACT EXTRACTION PHACO AND INTRAOCULAR LENS PLACEMENT ([place])  LEFT;  Surgeon: Eulogio Bear, MD;  Location: [place];  Service: Ophthalmology;  Laterality: Left;   CATARACT EXTRACTION W/PHACO Right 11/20/2017   Procedure: CATARACT EXTRACTION PHACO AND INTRAOCULAR LENS PLACEMENT (IOC) RIGHT;  Surgeon: Eulogio Bear, MD;  Location: [place];  Service: Ophthalmology;  Laterality: Right;   ESOPHAGOGASTRODUODENOSCOPY (EGD) WITH PROPOFOL N/A 10/31/2016   Procedure: ESOPHAGOGASTRODUODENOSCOPY (EGD) WITH PROPOFOL;  Surgeon: Lollie Sails, MD;  Location: [facility] ENDOSCOPY;  Service: Endoscopy;  Laterality: N/A;   I & D EXTREMITY Right 11/29/2021   Procedure: IRRIGATION AND DEBRIDEMENT EXTREMITY;  Surgeon: Olean Ree, MD;  Location: [facility] ORS;  Service: General;  Laterality: Right;   JOINT REPLACEMENT Right 2003   knee   knee meniscus repair Right    MELANOMA EXCISION Right    ear. Followed by Dr. 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 ([place]) 11/28/2021   Dyslipidemia 11/28/2021   GERD without esophagitis 11/28/2021   Proximal muscle weakness 08/27/2021   Speech disturbance 08/26/2021   Adverse drug reaction, initial encounter 05/04/2021   Lumbar radiculopathy 04/25/2021   Thoracic aortic atherosclerosis ([place]) 01/17/2021   Anxiety in acute stress reaction 12/07/2020   Mild cognitive impairment with memory loss 11/30/2020   Elevated PSA, between 10 and less than 20 ng/ml 11/30/2020   Chronic kidney disease, stage 3b ([place]) 11/10/2020   Benign hypertensive kidney disease with chronic  kidney disease 11/10/2020   COVID-19 virus infection 04/06/2020   Acquired trigger finger of left middle finger 08/07/2019   Carpal tunnel syndrome of right wrist 07/10/2019   Ulnar neuropathy 06/05/2019   Disorder of bursae of shoulder region 01/29/2019   Localized, primary osteoarthritis of shoulder region 01/29/2019   Localized, secondary osteoarthritis of the shoulder region 01/29/2019   Osteoarthritis of knee 01/29/2019   Shoulder joint pain 01/29/2019   Chronic obstructive pulmonary disease ([place]) 09/24/2018   Cervical radiculopathy 08/14/2018   PVC (premature ventricular contraction) 03/21/2018   PAD (peripheral artery disease) ([facility])    Anterior knee pain, left 12/26/2015   Hypothyroidism 07/04/2014   Nonallergic vasomotor rhinitis 07/01/2014   Hypertrophy of prostate with urinary obstruction and other lower urinary tract symptoms (LUTS) 08/08/2013   Low serum testosterone level 04/28/2013   Actinic keratosis 05/10/2010   History of malignant melanoma of skin 05/10/2010    ONSET DATE: date of referrals 02/01/2022 and 02/22/2022    REFERRING DIAG: R47.1 (ICD-10-CM) - Dysarthria R41.89 (ICD-10-CM) - Cognitive decline     PERTINENT HISTORY: Pt with recent course of Outpatient ST services same deficits (08/26/2021 and 09/07/2021).  [name]      07/21/2019 - pt underwent MRI guided focused ultrasound thalamotomy LEFT sided for treatment of essential tremor of RIGHT arm    07/30/2021 - pt underwent MRI guided focused ultrasound thalamotomy RIGHT sided for treatment of essential tremor of LEFT arm        DIAGNOSTIC FINDINGS:    MRi 07/05/2021 consistent with recent MR guided focus ultrasound ablation in the right subthalamic region A focus of diffusion restriction right subthalamic region measures 9 x 6 mm     MBSS 2017    MBSS 11/04/2021 Pt presents with overall adequate oropharyngeal abilities when consuming puree, soft solids, thin liquids via spoon and cup as well as whole  barium tablet with thin liquids. Pt's swallow function doesn't appear any different than documented in his previous MBSS (2017) which stated, "Thin liquid- small amount of laryngeal penetration with rapid successive swallowing maneuvers. With single swallowing maneuvers no laryngeal penetration is observed. As well in this previous study, pt continues to demonstrate penetration with trace aspiration when consuming successive swallows. Pt demonstrated understanding need to take single sips as he stated that he doesn't consume consecutive sips at baseline. At this time, pt presents with adequate oropharyngeal abilities and reduced risk of aspiration when consuming regular diet with thin liquids via single cup sips and medicine whole with thin liquids. Skilled ST intervention is not indicated.   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 No  PATIENT GOALS: to get better  OBJECTIVE:   TODAY'S TREATMENT:  Skilled treatment session focused on pt's cognitive communication goals. SLP facilitated session by providing the following interventions:  Pt reports left ear pain but has appt tomorrow. Will be receiving his bottom dentures this afternoon.  Reports doing car transfer with PT at [facility]. With his wife's help pt able to recall mechanics of transfer with   Pt reports wife reminded him that the bottom line price on the car was higher than the purchase price.   SLP instructed in memory hook "bottom first" in the car like sitting in a chair.   Pt reports that he thought he heard [name] call him this morning at 3am so he got out of bed and shaved, thought he heard her again at 4am and again at 5am despite his wife remaining in bed at these hours  Recommend having  digital clock, hearing aids, watch charger and iphone charger beside pt's bed to give him frame of reference with time. They will attempt to accommodate these items at bedside.     PATIENT EDUCATION: Education details: see above Person educated: Patient and Spouse Education method: Consulting civil engineer, Media planner, Verbal cues, and Handouts Education comprehension: needs further education  HOME EXERCISE PROGRAM:  Recommend pt's wife handle all phone calls related to appts or financial issues as pt has difficulty problem solving and remembering information  GOALS: Goals reviewed with patient? Yes  SHORT TERM GOALS: Target date: 10 sessions  Patient will demonstrate understanding of functional cognitive activities for home maintenance program with moderate assistance Baseline: Maximal Goal status: ONGOING   2.  Given moderate assistance, patient will demonstrate knowledge of appropriate activities to support cognitive and  language function outside of ST with assistance from family.  Baseline: Maximal Goal status: ONGOING   3.  Patient will report engagement in cognitive activities outside of ST 4/7 days.  Baseline:  Goal status: ONGOING   4.  Pt will verbalize and demonstrate how to implement 1 memory and attention strategies to aid daily functioning with moderate A over 2 sessions  Baseline: currently not using any strategies Goal status: ONGOING     LONG TERM GOALS: Target date: 05/17/2022 Patient will demonstrate understanding of functional cognitive activities for home maintenance program with minimal assistance Baseline: maximal Goal status: ONGOING  ASSESSMENT:  CLINICAL IMPRESSION: Pt continues to be responsive to recommendations and pt's wife is very receptive to additional ways in which to help pt such as managing medicine, handling calls to doctor's offices.   OBJECTIVE IMPAIRMENTS include attention, memory, awareness, executive functioning, dysarthria, dysphagia, and  motor speech . These impairments are limiting patient from managing medications, managing appointments, managing finances, household responsibilities, ADLs/IADLs, effectively communicating at home and in community, and safety when swallowing. Factors affecting potential to achieve goals and functional outcome are ability to learn/carryover information, co-morbidities, cooperation/participation level, medical prognosis, previous level of function, and severity of impairments. Patient will benefit from skilled SLP services to address above impairments and improve overall function.  REHAB POTENTIAL: Fair bilateral thalamotomy  PLAN: SLP FREQUENCY: 1-2x/week  SLP DURATION: 8 weeks  PLANNED INTERVENTIONS: Aspiration precaution training, Environmental controls, Cueing hierachy, Cognitive reorganization, Internal/external aids, Functional tasks, SLP instruction and feedback, Compensatory strategies, and Patient/family education  Ronell Boldin B. Rutherford Nail, M.S., CCC-SLP, Mining engineer Certified Brain Injury [place]  [place] Office [phone] Ascom [phone] Fax [phone]

## 2022-05-11 NOTE — Telephone Encounter (Signed)
*  STAT* If patient is at the pharmacy, call can be transferred to refill team.   1. Which medications need to be refilled? (please list name of each medication and dose if known) ezetimibe (ZETIA) 10 MG tablet   2. Which pharmacy/location (including street and city if local pharmacy) is medication to be sent to? [name]  3. Do they need a 30 day or 90 day supply? 90   Patient is has appt on 4/10

## 2022-05-12 ENCOUNTER — Ambulatory Visit
Admission: RE | Admit: 2022-05-12 | Discharge: 2022-05-12 | Disposition: A | Discharge status: Home / Self Care | Payer: PPO | Source: Ambulatory Visit | Attending: Internal Medicine | Admitting: Internal Medicine

## 2022-05-12 DIAGNOSIS — F03B11 Unspecified dementia, moderate, with agitation: Secondary | ICD-10-CM | POA: Diagnosis not present

## 2022-05-12 DIAGNOSIS — G919 Hydrocephalus, unspecified: Secondary | ICD-10-CM | POA: Diagnosis not present

## 2022-05-12 DIAGNOSIS — H60332 Swimmer's ear, left ear: Secondary | ICD-10-CM | POA: Diagnosis not present

## 2022-05-15 ENCOUNTER — Telehealth: Payer: Self-pay | Admitting: Internal Medicine

## 2022-05-15 DIAGNOSIS — R29898 Other symptoms and signs involving the musculoskeletal system: Secondary | ICD-10-CM | POA: Diagnosis not present

## 2022-05-15 DIAGNOSIS — R2689 Other abnormalities of gait and mobility: Secondary | ICD-10-CM | POA: Diagnosis not present

## 2022-05-15 DIAGNOSIS — R2681 Unsteadiness on feet: Secondary | ICD-10-CM | POA: Diagnosis not present

## 2022-05-15 MED ORDER — PAROXETINE HCL 30 MG PO TABS: 30.0000 mg | ORAL_TABLET | Freq: Every day | ORAL | 1 refills | Status: DC

## 2022-05-15 NOTE — Telephone Encounter (Signed)
Pt wife advised 

## 2022-05-15 NOTE — Telephone Encounter (Signed)
Paxil 30 mg sent to [facility]

## 2022-05-15 NOTE — Telephone Encounter (Signed)
Pt wife called stating pt need a refill on [facility] because the [facility] is [facility]. Pt wife called stating the provider was going to increase the medication from '10mg'$  to '30mg'$ 

## 2022-05-16 ENCOUNTER — Ambulatory Visit: Payer: PPO | Admitting: Speech Pathology

## 2022-05-18 ENCOUNTER — Ambulatory Visit: Payer: PPO | Attending: Internal Medicine | Admitting: Speech Pathology

## 2022-05-18 DIAGNOSIS — F0283 Dementia in other diseases classified elsewhere, unspecified severity, with mood disturbance: Secondary | ICD-10-CM | POA: Insufficient documentation

## 2022-05-18 DIAGNOSIS — R41841 Cognitive communication deficit: Secondary | ICD-10-CM | POA: Diagnosis not present

## 2022-05-18 DIAGNOSIS — G309 Alzheimer's disease, unspecified: Secondary | ICD-10-CM | POA: Insufficient documentation

## 2022-05-21 NOTE — Therapy (Signed)
OUTPATIENT SPEECH LANGUAGE PATHOLOGY TREATMENT NOTE RE-CERTIFICATION REQUEST   Patient Name: Theodore [name] MRN: [id number] DOB:1929/08/09, 89 y.o., male Today's Date: 05/18/2022   PCP: [name], MD REFERRING PROVIDER: [name], MD   END OF SESSION:   End of Session - 05/18/22 1918     Visit Number 14    Number of Visits 25    Date for SLP Re-Evaluation 06/29/22    Authorization Type [employer]    Progress Note Due on Visit 10    SLP Start Time 1400    SLP Stop Time  1445    SLP Time Calculation (min) 45 min    Activity Tolerance Patient tolerated treatment well;Patient limited by pain              Past Medical History:  Diagnosis Date   Actinic keratosis    Arthritis    knees   Asymptomatic Sinus bradycardia    a. 05/2016 Zio: Avg HR 61 (41-167).   Benign essential tremor    Cancer ([place]) 2002   melanoma right ear   Colon polyp    COPD (chronic obstructive pulmonary disease) ([place])    pt said he believes it is a misdiagnosis    Coronary artery calcification seen on CT scan    a. 06/2016 CTA Chest: cor Ca2+; b. 05/2017 MV: EF >65%. No ischemia/infarct.   Depression    Dysplastic nevus 04/25/2022   neck - anterior, severe, needs exc (check location at appt)   History of echocardiogram    a. 04/2016 Echo: EF 60-65%, mild conc LVH. Nl PASP.   History of kidney stones    Hx of dysplastic nevus 05/07/2017   L anterior shoulder, severe   Hx of dysplastic nevus 09/06/2020   R lower sternum, moderate atypia   Hypothyroidism    Hypothyroidism    Laceration of right lower extremity    Melanoma ([place]) 1990's per pt   R ear   Melanoma in situ ([place]) 01/12/2014   left jaw   MSSA infection, non-invasive    Palpitations    a. 05/2016 Zio: Avg HR 61 (41-167). 9 SVT runs (fastest 167 - 5 beats; longest 8 beats - 101 bpm). Rare PACs/PVCs.   PVC's (premature ventricular contractions)    Rhinitis    Sepsis due to cellulitis ([place]) 11/28/2021   Severe  sepsis ([place]) 11/28/2021   Wears dentures    partial upper and lower   Wears hearing aid in both ears    Past Surgical History:  Procedure Laterality Date   ADENOIDECTOMY     CATARACT EXTRACTION W/PHACO Left 10/22/2017   Procedure: CATARACT EXTRACTION PHACO AND INTRAOCULAR LENS PLACEMENT ([place])  LEFT;  Surgeon: Eulogio Bear, MD;  Location: [facility];  Service: Ophthalmology;  Laterality: Left;   CATARACT EXTRACTION W/PHACO Right 11/20/2017   Procedure: CATARACT EXTRACTION PHACO AND INTRAOCULAR LENS PLACEMENT (IOC) RIGHT;  Surgeon: Eulogio Bear, MD;  Location: [facility];  Service: Ophthalmology;  Laterality: Right;   ESOPHAGOGASTRODUODENOSCOPY (EGD) WITH PROPOFOL N/A 10/31/2016   Procedure: ESOPHAGOGASTRODUODENOSCOPY (EGD) WITH PROPOFOL;  Surgeon: Lollie Sails, MD;  Location: [facility] ENDOSCOPY;  Service: Endoscopy;  Laterality: N/A;   I & D EXTREMITY Right 11/29/2021   Procedure: IRRIGATION AND DEBRIDEMENT EXTREMITY;  Surgeon: Olean Ree, MD;  Location: [facility] ORS;  Service: General;  Laterality: Right;   JOINT REPLACEMENT Right 2003   knee   knee meniscus repair Right    MELANOMA EXCISION Right    ear.  Followed by Dr. Nehemiah Massed   PATELLECTOMY Bilateral    TONSILLECTOMY     Patient Active Problem List   Diagnosis Date Noted   Sialorrhea 02/02/2022   Protein-calorie malnutrition, moderate ([place]) 11/29/2021   Dementia in Alzheimer's disease with depression ([place]) 11/28/2021   Dyslipidemia 11/28/2021   GERD without esophagitis 11/28/2021   Proximal muscle weakness 08/27/2021   Speech disturbance 08/26/2021   Adverse drug reaction, initial encounter 05/04/2021   Lumbar radiculopathy 04/25/2021   Thoracic aortic atherosclerosis ([place]) 01/17/2021   Anxiety in acute stress reaction 12/07/2020   Mild cognitive impairment with memory loss 11/30/2020   Elevated PSA, between 10 and less than 20 ng/ml 11/30/2020   Chronic kidney disease, stage 3b ([place]) 11/10/2020    Benign hypertensive kidney disease with chronic kidney disease 11/10/2020   COVID-19 virus infection 04/06/2020   Acquired trigger finger of left middle finger 08/07/2019   Carpal tunnel syndrome of right wrist 07/10/2019   Ulnar neuropathy 06/05/2019   Disorder of bursae of shoulder region 01/29/2019   Localized, primary osteoarthritis of shoulder region 01/29/2019   Localized, secondary osteoarthritis of the shoulder region 01/29/2019   Osteoarthritis of knee 01/29/2019   Shoulder joint pain 01/29/2019   Chronic obstructive pulmonary disease ([place]) 09/24/2018   Cervical radiculopathy 08/14/2018   PVC (premature ventricular contraction) 03/21/2018   PAD (peripheral artery disease) ([place])    Anterior knee pain, left 12/26/2015   Hypothyroidism 07/04/2014   Nonallergic vasomotor rhinitis 07/01/2014   Hypertrophy of prostate with urinary obstruction and other lower urinary tract symptoms (LUTS) 08/08/2013   Low serum testosterone level 04/28/2013   Actinic keratosis 05/10/2010   History of malignant melanoma of skin 05/10/2010    ONSET DATE: date of referrals 02/01/2022 and 02/22/2022    REFERRING DIAG: R47.1 (ICD-10-CM) - Dysarthria R41.89 (ICD-10-CM) - Cognitive decline     PERTINENT HISTORY: Pt with recent course of Outpatient ST services same deficits (08/26/2021 and 09/07/2021).  [name]      07/21/2019 - pt underwent MRI guided focused ultrasound thalamotomy LEFT sided for treatment of essential tremor of RIGHT arm    07/30/2021 - pt underwent MRI guided focused ultrasound thalamotomy RIGHT sided for treatment of essential tremor of LEFT arm        DIAGNOSTIC FINDINGS:    MRi 07/05/2021 consistent with recent MR guided focus ultrasound ablation in the right subthalamic region A focus of diffusion restriction right subthalamic region measures 9 x 6 mm     MBSS 2017    MBSS 11/04/2021 Pt presents with overall adequate oropharyngeal abilities when consuming puree, soft  solids, thin liquids via spoon and cup as well as whole barium tablet with thin liquids. Pt's swallow function doesn't appear any different than documented in his previous MBSS (2017) which stated, "Thin liquid- small amount of laryngeal penetration with rapid successive swallowing maneuvers. With single swallowing maneuvers no laryngeal penetration is observed. As well in this previous study, pt continues to demonstrate penetration with trace aspiration when consuming successive swallows. Pt demonstrated understanding need to take single sips as he stated that he doesn't consume consecutive sips at baseline. At this time, pt presents with adequate oropharyngeal abilities and reduced risk of aspiration when consuming regular diet with thin liquids via single cup sips and medicine whole with thin liquids. Skilled ST intervention is not indicated.   THERAPY DIAG:  Cognitive communication deficit  Dementia in Alzheimer's disease with depression ([place])  Rationale for Evaluation and Treatment Rehabilitation  SUBJECTIVE: Continues to experience ear  pain  Pt accompanied by: significant other  PAIN:  Are you having pain? No  PATIENT GOALS: to get better  OBJECTIVE:   TODAY'S TREATMENT:  Skilled treatment session focused on pt's cognitive communication goals. SLP facilitated session by providing the following interventions:  With SLP's assistance, pt able to list cognitive stimulating activities to participate in outside of therapy session In addition recommend that attend open house for day program specially designed for residents with dementia at pt's [facility]   PATIENT EDUCATION: Education details: see above Person educated: Patient and Spouse Education method: Consulting civil engineer, Media planner, Verbal cues, and Handouts Education comprehension: needs further education  HOME EXERCISE PROGRAM:  Recommend pt and his wife attend open house at day program  GOALS: Goals reviewed with patient?  Yes  SHORT TERM GOALS: Target date: 10 sessions  Updated: 05/18/2022  Patient will demonstrate understanding of functional cognitive activities for home maintenance program with moderate assistance Baseline: Maximal Goal status: ONGOING Goal status: MET   2.  Given moderate assistance, patient will demonstrate knowledge of appropriate activities to support cognitive and  language function outside of ST with assistance from family.  Baseline: Maximal Goal status: ONGOING Goal status: MET   3.  Patient will report engagement in cognitive activities outside of ST 4/7 days.  Baseline:  Goal status: ONGOING Goal status: ONGOING   4.  Pt will verbalize and demonstrate how to implement 1 memory and attention strategies to aid daily functioning with moderate A over 2 sessions  Baseline: currently not using any strategies Goal status: ONGOING Goal status: ONGOING     LONG TERM GOALS: Target date: 06/29/2022 Updated: 05/18/2022 Patient will demonstrate understanding of functional cognitive activities for home maintenance program with minimal assistance Baseline: maximal Goal status: ONGOING Goal status: ONGOING    ASSESSMENT:  CLINICAL IMPRESSION: Pt continues to make progress towards goals and will request re-certification for ongoing cognitive communication therapy.   OBJECTIVE IMPAIRMENTS include attention, memory, awareness, executive functioning, dysarthria, dysphagia, and motor speech . These impairments are limiting patient from managing medications, managing appointments, managing finances, household responsibilities, ADLs/IADLs, effectively communicating at home and in community, and safety when swallowing. Factors affecting potential to achieve goals and functional outcome are ability to learn/carryover information, co-morbidities, cooperation/participation level, medical prognosis, previous level of function, and severity of impairments. Patient will benefit from skilled SLP  services to address above impairments and improve overall function.  REHAB POTENTIAL: Fair bilateral thalamotomy  PLAN: SLP FREQUENCY: 1-2x/week  SLP DURATION: 8 weeks  PLANNED INTERVENTIONS: Aspiration precaution training, Environmental controls, Cueing hierachy, Cognitive reorganization, Internal/external aids, Functional tasks, SLP instruction and feedback, Compensatory strategies, and Patient/family education  Rogerick Baldwin B. Rutherford Nail, M.S., CCC-SLP, Mining engineer Certified [facility]  [place] Office [phone] Ascom [phone] Fax [phone]

## 2022-05-23 ENCOUNTER — Ambulatory Visit: Payer: PPO | Admitting: Speech Pathology

## 2022-05-25 ENCOUNTER — Ambulatory Visit: Payer: PPO | Admitting: Speech Pathology

## 2022-05-29 DIAGNOSIS — T162XXA Foreign body in left ear, initial encounter: Secondary | ICD-10-CM | POA: Diagnosis not present

## 2022-05-29 DIAGNOSIS — H60332 Swimmer's ear, left ear: Secondary | ICD-10-CM | POA: Diagnosis not present

## 2022-05-30 ENCOUNTER — Ambulatory Visit: Payer: PPO | Admitting: Speech Pathology

## 2022-06-01 ENCOUNTER — Ambulatory Visit: Payer: PPO | Admitting: Speech Pathology

## 2022-06-01 ENCOUNTER — Encounter: Payer: Self-pay | Admitting: Internal Medicine

## 2022-06-02 ENCOUNTER — Ambulatory Visit
Admission: EM | Admit: 2022-06-02 | Discharge: 2022-06-02 | Disposition: A | Discharge status: Home / Self Care | Payer: PPO | Attending: Emergency Medicine | Admitting: Emergency Medicine

## 2022-06-02 DIAGNOSIS — L03116 Cellulitis of left lower limb: Secondary | ICD-10-CM | POA: Diagnosis not present

## 2022-06-02 DIAGNOSIS — S81802A Unspecified open wound, left lower leg, initial encounter: Secondary | ICD-10-CM | POA: Diagnosis not present

## 2022-06-02 MED ORDER — DOXYCYCLINE HYCLATE 100 MG PO CAPS: 100.0000 mg | ORAL_CAPSULE | Freq: Two times a day (BID) | ORAL | 0 refills | Status: AC

## 2022-06-02 NOTE — Discharge Instructions (Addendum)
Take the doxycycline as directed.   A referral to the Wound Care clinic has been sent.  They will contact you to schedule an appointment.    Follow up with your primary care provider on Monday.

## 2022-06-02 NOTE — ED Triage Notes (Signed)
Patient to Urgent Care with complaints of a non-healing wound present to the back of his left calf. Patient reports that he was moving a metal sculpture two weeks ago, fell back and cut his leg.   Area currently dressed with band-aid, rotating a+d ointment/ polysporin. Has noticed some oozing.

## 2022-06-02 NOTE — ED Provider Notes (Signed)
Theodore [name]    CSN: [id number] Arrival date & time: 06/02/22  1412      History   Chief Complaint Chief Complaint  Patient presents with   Wound Check    HPI Theodore [name] is a 89 y.o. male.  Accompanied by his wife, patient presents with a wound on his left lower leg that is not healing.  It has some purulent drainage.  Treating with topical antibiotic ointment.  He denies fever, focal weakness, numbness, or other symptoms.  He has history of nonhealing wound on his other leg.  His medical history includes peripheral artery disease.  The history is provided by the patient, the spouse and medical records.    Past Medical History:  Diagnosis Date   Actinic keratosis    Arthritis    knees   Asymptomatic Sinus bradycardia    a. 05/2016 Zio: Avg HR 61 (41-167).   Benign essential tremor    Cancer ([place]) 2002   melanoma right ear   Colon polyp    COPD (chronic obstructive pulmonary disease) ([place])    pt said he believes it is a misdiagnosis    Coronary artery calcification seen on CT scan    a. 06/2016 CTA Chest: cor Ca2+; b. 05/2017 MV: EF >65%. No ischemia/infarct.   Depression    Dysplastic nevus 04/25/2022   neck - anterior, severe, needs exc (check location at appt)   History of echocardiogram    a. 04/2016 Echo: EF 60-65%, mild conc LVH. Nl PASP.   History of kidney stones    Hx of dysplastic nevus 05/07/2017   L anterior shoulder, severe   Hx of dysplastic nevus 09/06/2020   R lower sternum, moderate atypia   Hypothyroidism    Hypothyroidism    Laceration of right lower extremity    Melanoma ([place]) 1990's per pt   R ear   Melanoma in situ ([place]) 01/12/2014   left jaw   MSSA infection, non-invasive    Palpitations    a. 05/2016 Zio: Avg HR 61 (41-167). 9 SVT runs (fastest 167 - 5 beats; longest 8 beats - 101 bpm). Rare PACs/PVCs.   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 ([place]) 11/28/2021   Dyslipidemia 11/28/2021   GERD without esophagitis 11/28/2021   Proximal muscle weakness 08/27/2021   Speech disturbance 08/26/2021   Adverse drug reaction, initial encounter 05/04/2021   Lumbar radiculopathy 04/25/2021   Thoracic aortic atherosclerosis ([place]) 01/17/2021   Anxiety in acute stress reaction 12/07/2020   Mild cognitive impairment with memory loss 11/30/2020   Elevated PSA, between 10 and less than 20 ng/ml 11/30/2020   Chronic kidney disease, stage 3b ([place]) 11/10/2020   Benign hypertensive kidney disease with chronic kidney disease 11/10/2020   COVID-19 virus infection 04/06/2020   Acquired trigger finger of left middle finger 08/07/2019   Carpal tunnel syndrome of right wrist 07/10/2019   Ulnar neuropathy 06/05/2019   Disorder of bursae of shoulder region 01/29/2019   Localized, primary osteoarthritis of shoulder region 01/29/2019   Localized, secondary osteoarthritis of the shoulder region 01/29/2019   Osteoarthritis of knee  01/29/2019   Shoulder joint pain 01/29/2019   Chronic obstructive pulmonary disease ([place]) 09/24/2018   Cervical radiculopathy 08/14/2018   PVC (premature ventricular contraction) 03/21/2018   PAD (peripheral artery disease) ([facility])    Anterior knee pain, left 12/26/2015   Hypothyroidism 07/04/2014   Nonallergic vasomotor rhinitis 07/01/2014   Hypertrophy of prostate with urinary obstruction and other lower urinary tract symptoms (LUTS) 08/08/2013   Low serum testosterone level 04/28/2013   Actinic keratosis 05/10/2010   History of malignant melanoma of skin 05/10/2010    Past Surgical History:  Procedure Laterality Date   ADENOIDECTOMY      CATARACT EXTRACTION W/PHACO Left 10/22/2017   Procedure: CATARACT EXTRACTION PHACO AND INTRAOCULAR LENS PLACEMENT ([place])  LEFT;  Surgeon: Eulogio Bear, MD;  Location: [facility];  Service: Ophthalmology;  Laterality: Left;   CATARACT EXTRACTION W/PHACO Right 11/20/2017   Procedure: CATARACT EXTRACTION PHACO AND INTRAOCULAR LENS PLACEMENT (IOC) RIGHT;  Surgeon: Eulogio Bear, MD;  Location: [facility];  Service: Ophthalmology;  Laterality: Right;   ESOPHAGOGASTRODUODENOSCOPY (EGD) WITH PROPOFOL N/A 10/31/2016   Procedure: ESOPHAGOGASTRODUODENOSCOPY (EGD) WITH PROPOFOL;  Surgeon: Lollie Sails, MD;  Location: [facility] ENDOSCOPY;  Service: Endoscopy;  Laterality: N/A;   I & D EXTREMITY Right 11/29/2021   Procedure: IRRIGATION AND DEBRIDEMENT EXTREMITY;  Surgeon: Olean Ree, MD;  Location: [facility] ORS;  Service: General;  Laterality: Right;   JOINT REPLACEMENT Right 2003   knee   knee meniscus repair Right    MELANOMA EXCISION Right    ear. Followed by Dr. Nehemiah Massed   PATELLECTOMY Bilateral    TONSILLECTOMY         Home Medications    Prior to Admission medications   Medication Sig Start Date End Date Taking? Authorizing Provider  doxycycline (VIBRAMYCIN) 100 MG capsule Take 1 capsule (100 mg total) by mouth 2 (two) times daily for 7 days. 06/02/22 06/09/22 Yes Sharion Balloon, NP  ALPRAZolam Duanne Moron) 0.25 MG tablet Take 1 tablet (0.25 mg total) by mouth 2 (two) times daily as needed for anxiety or sleep. 03/28/22   [name], MD  ascorbic acid (VITAMIN C) 500 MG tablet Take 500 mg by mouth daily.    [provider]  Cholecalciferol (VITAMIN D3) 50 MCG (2000 UT) TABS Take 1 tablet by mouth daily.    [provider]  cycloSPORINE (RESTASIS) 0.05 % ophthalmic emulsion Place 1 drop into both eyes 2 (two) times daily.    [provider]  ezetimibe (ZETIA) 10 MG tablet Take 1 tablet (10 mg total) by mouth daily. 05/11/22   Minna Merritts, MD   fluticasone (FLONASE) 50 MCG/ACT nasal spray SPRAY 2 SPRAYS INTO EACH NOSTRIL EVERY DAY 03/14/22   Flora Lipps, MD  levothyroxine (SYNTHROID) 100 MCG tablet TAKE 1 TABLET BY MOUTH EVERY DAY BEFORE BREAKFAST 01/23/22   [name], MD  magnesium oxide (MAG-OX) 400 MG tablet Take 400 mg by mouth daily.    [provider]  PARoxetine (PAXIL) 30 MG tablet Take 1 tablet (30 mg total) by mouth daily. 05/15/22   [name], MD  propranolol ER (INDERAL LA) 60 MG 24 hr capsule Take 1 capsule (60 mg total) by mouth daily. 05/03/22   [name], MD  rosuvastatin (CRESTOR) 10 MG tablet Take 1 tablet (10 mg total) by mouth every other day. 04/04/22   [name], MD  Saccharomyces boulardii (PROBIOTIC) 250 MG CAPS Take 1 capsule by mouth daily at 12 noon.  [provider]  testosterone cypionate (DEPOTESTOSTERONE CYPIONATE) 200 MG/ML injection INJECT 0.5 ML (100 MG TOTAL) INTO THE MUSCLE EVERY 14 DAYS. 05/02/22   [name], MD  vitamin B-12 (CYANOCOBALAMIN) 500 MCG tablet Take 1 tablet by mouth daily.    [provider]    Family History Family History  Problem Relation Age of Onset   Hypertension Mother    Heart disease Mother        CHF   Heart disease Father    Cancer Sister        melanoma    Social History Social History   Tobacco Use   Smoking status: Never   Smokeless tobacco: Never  Vaping Use   Vaping Use: Never used  Substance Use Topics   Alcohol use: Yes    Comment: 1 glass of wine daily   Drug use: No     Allergies   Penicillins, Benzonatate, Lexapro [escitalopram], and Molnupiravir   Review of Systems Review of Systems  Constitutional:  Negative for chills and fever.  Skin:  Positive for color change and wound.  Neurological:  Negative for weakness and numbness.     Physical Exam Triage Vital Signs ED Triage Vitals  Enc Vitals Group     BP 06/02/22 1512 112/89     Pulse Rate 06/02/22 1504 (!) 52     Resp 06/02/22  1504 18     Temp 06/02/22 1504 97.7 F (36.5 C)     Temp src --      SpO2 06/02/22 1504 97 %     Weight --      Height --      Head Circumference --      Peak Flow --      Pain Score 06/02/22 1508 8     Pain Loc --      Pain Edu? --      Excl. in [place]? --    No data found.  Updated Vital Signs BP 112/89   Pulse (!) 52   Temp 97.7 F (36.5 C)   Resp 18   SpO2 97%   Visual Acuity Right Eye Distance:   Left Eye Distance:   Bilateral Distance:    Right Eye Near:   Left Eye Near:    Bilateral Near:     Physical Exam Vitals and nursing note reviewed.  Constitutional:      General: He is not in acute distress.    Appearance: Normal appearance. He is well-developed. He is not ill-appearing.  HENT:     Mouth/Throat:     Mouth: Mucous membranes are moist.  Cardiovascular:     Rate and Rhythm: Normal rate and regular rhythm.     Heart sounds: Normal heart sounds.  Pulmonary:     Effort: Pulmonary effort is normal. No respiratory distress.     Breath sounds: Normal breath sounds.  Musculoskeletal:     Cervical back: Neck supple.  Skin:    General: Skin is warm and dry.     Findings: Erythema and lesion present.     Comments: Open wound on left lateral lower leg with surrounding erythema.  See picture.   Neurological:     Mental Status: He is alert.  Psychiatric:        Mood and Affect: Mood normal.        Behavior: Behavior normal.      UC Treatments / Results  Labs (all labs ordered are listed, but only abnormal results are  displayed) Labs Reviewed - No data to display  EKG   Radiology No results found.  Procedures Procedures (including critical care time)  Medications Ordered in UC Medications - No data to display  Initial Impression / Assessment and Plan / UC Course  I have reviewed the triage vital signs and the nursing notes.  Pertinent labs & imaging results that were available during my care of the patient were reviewed by me and considered  in my medical decision making (see chart for details).   Open wound and cellulitis of left lower leg.  Treating with doxycycline.  Referral to Wound Care center made; Discussed that the wound care center will contact him.  Instructed patient and his wife to follow up with his PCP next week.  Wound care instructions given.  ED precautions discussed.  Patient and his wife agree to plan of care.     Final Clinical Impressions(s) / UC Diagnoses   Final diagnoses:  Cellulitis of left lower leg  Open wound of left lower leg, initial encounter     Discharge Instructions      Take the doxycycline as directed.   A referral to the Wound Care clinic has been sent.  They will contact you to schedule an appointment.    Follow up with your primary care provider on Monday.        ED Prescriptions     Medication Sig Dispense Auth. Provider   doxycycline (VIBRAMYCIN) 100 MG capsule Take 1 capsule (100 mg total) by mouth 2 (two) times daily for 7 days. 14 capsule Sharion Balloon, NP      PDMP not reviewed this encounter.   Sharion Balloon, NP 06/02/22 [phone]

## 2022-06-05 ENCOUNTER — Encounter: Payer: Self-pay | Admitting: Dermatology

## 2022-06-05 ENCOUNTER — Ambulatory Visit: Payer: PPO | Admitting: Dermatology

## 2022-06-05 DIAGNOSIS — L820 Inflamed seborrheic keratosis: Secondary | ICD-10-CM

## 2022-06-05 DIAGNOSIS — L578 Other skin changes due to chronic exposure to nonionizing radiation: Secondary | ICD-10-CM

## 2022-06-05 DIAGNOSIS — L988 Other specified disorders of the skin and subcutaneous tissue: Secondary | ICD-10-CM | POA: Diagnosis not present

## 2022-06-05 DIAGNOSIS — Z86006 Personal history of melanoma in-situ: Secondary | ICD-10-CM | POA: Diagnosis not present

## 2022-06-05 DIAGNOSIS — D485 Neoplasm of uncertain behavior of skin: Secondary | ICD-10-CM

## 2022-06-05 DIAGNOSIS — L821 Other seborrheic keratosis: Secondary | ICD-10-CM | POA: Diagnosis not present

## 2022-06-05 DIAGNOSIS — Z85820 Personal history of malignant melanoma of skin: Secondary | ICD-10-CM

## 2022-06-05 NOTE — Progress Notes (Signed)
   Follow-Up Visit   Subjective  Theodore [name] is a 89 y.o. male who presents for the following: Procedure (Patient here today for shave removal of bx proven severe dysplastic nevus at left anterior neck.).  Patient accompanied by wife who contributes to history.   The following portions of the chart were reviewed this encounter and updated as appropriate:       Review of Systems:  No other skin or systemic complaints except as noted in HPI or Assessment and Plan.  Objective  Well appearing patient in no apparent distress; mood and affect are within normal limits.  A focused examination was performed including neck, ears, face. Relevant physical exam findings are noted in the Assessment and Plan.  Neck - Anterior Pink bx site- appears clear without pigment  Left Anterior Ear x 2 (2) Erythematous stuck-on, waxy papule    Assessment & Plan  Neoplasm of uncertain behavior of skin Neck - Anterior  Epidermal / dermal shaving  Lesion diameter (cm):  1.1 Informed consent: discussed and consent obtained   Patient was prepped and draped in usual sterile fashion: area prepped with alcohol. Anesthesia: the lesion was anesthetized in a standard fashion   Anesthetic:  1% lidocaine w/ epinephrine 1-100,000 buffered w/ 8.4% NaHCO3 Instrument used: flexible razor blade   Hemostasis achieved with: pressure, aluminum chloride and electrodesiccation   Outcome: patient tolerated procedure well   Post-procedure details: wound care instructions given   Post-procedure details comment:  Ointment and small bandage applied  Specimen 1 - Surgical pathology Differential Diagnosis: r/o residual dysplastic nevus  Check Margins: yes Pink bx site [phone]    Inflamed seborrheic keratosis (2) Left Anterior Ear x 2  Symptomatic, irritating, patient would like treated.   Destruction of lesion - Left Anterior Ear x 2  Destruction method: cryotherapy   Informed consent: discussed and  consent obtained   Lesion destroyed using liquid nitrogen: Yes   Region frozen until ice ball extended beyond lesion: Yes   Outcome: patient tolerated procedure well with no complications   Post-procedure details: wound care instructions given   Additional details:  Prior to procedure, discussed risks of blister formation, small wound, skin dyspigmentation, or rare scar following cryotherapy. Recommend Vaseline ointment to treated areas while healing.   Actinic Damage - chronic, secondary to cumulative UV radiation exposure/sun exposure over time - diffuse scaly erythematous macules with underlying dyspigmentation - Recommend daily broad spectrum sunscreen SPF 30+ to sun-exposed areas, reapply every 2 hours as needed.  - Recommend staying in the shade or wearing long sleeves, sun glasses (UVA+UVB protection) and wide brim hats (4-inch brim around the entire circumference of the hat). - Call for new or changing lesions.   History of Melanoma and Melanoma in Situ - No evidence of recurrence today - Recommend regular full body skin exams - Recommend daily broad spectrum sunscreen SPF 30+ to sun-exposed areas, reapply every 2 hours as needed.  - Call if any new or changing lesions are noted between office visits   Theodore [name], waxy, tan-brown papules and/or plaques  - Benign-appearing - Discussed benign etiology and prognosis. - Observe - Call for any changes   Return for Hx AK, Hx MM, Hx MMis, as scheduled.  [name], RMA, am acting as scribe for [name], MD .  Documentation: I have reviewed the above documentation for accuracy and completeness, and I agree with the above.  [name] MD

## 2022-06-05 NOTE — Progress Notes (Deleted)
   Follow-Up Visit   Subjective  Theodore [name] is a 89 y.o. male who presents for the following: Excision of bx proven severe dysplastic nevus at anterior neck.   The following portions of the chart were reviewed this encounter and updated as appropriate: medications, allergies, medical history  Review of Systems:  No other skin or systemic complaints except as noted in HPI or Assessment and Plan.  Objective  Well appearing patient in no apparent distress; mood and affect are within normal limits.  A focused examination was performed of the following areas: neck Relevant physical exam findings are noted in the Assessment and Plan.   Neck - Anterior Pink bx site    Assessment & Plan   Neoplasm of uncertain behavior of skin Neck - Anterior  [phone]     No follow-ups on file.  [name], RMA, am acting as scribe for [name], MD .    Documentation: I have reviewed the above documentation for accuracy and completeness, and I agree with the above.  [name], MD

## 2022-06-05 NOTE — Patient Instructions (Signed)
Wound Care Instructions  Cleanse wound gently with soap and water once a day then pat dry with clean gauze. Apply a thin coat of Petrolatum (petroleum jelly, "Vaseline") over the wound (unless you have an allergy to this). We recommend that you use a new, sterile tube of Vaseline. Do not pick or remove scabs. Do not remove the yellow or white "healing tissue" from the base of the wound.  Cover the wound with fresh, clean, nonstick gauze and secure with paper tape. You may use Band-Aids in place of gauze and tape if the wound is small enough, but would recommend trimming much of the tape off as there is often too much. Sometimes Band-Aids can irritate the skin.  You should call the office for your biopsy report after 1 week if you have not already been contacted.  If you experience any problems, such as abnormal amounts of bleeding, swelling, significant bruising, significant pain, or evidence of infection, please call the office immediately.  FOR ADULT SURGERY PATIENTS: If you need something for pain relief you may take 1 extra strength Tylenol (acetaminophen) AND 2 Ibuprofen (200mg each) together every 4 hours as needed for pain. (do not take these if you are allergic to them or if you have a reason you should not take them.) Typically, you may only need pain medication for 1 to 3 days.     Due to recent changes in healthcare laws, you may see results of your pathology and/or laboratory studies on MyChart before the doctors have had a chance to review them. We understand that in some cases there may be results that are confusing or concerning to you. Please understand that not all results are received at the same time and often the doctors may need to interpret multiple results in order to provide you with the best plan of care or course of treatment. Therefore, we ask that you please give us 2 business days to thoroughly review all your results before contacting the office for clarification. Should  we see a critical lab result, you will be contacted sooner.   If You Need Anything After Your Visit  If you have any questions or concerns for your doctor, please call our main line at [phone] and press option 4 to reach your doctor's medical assistant. If no one answers, please leave a voicemail as directed and we will return your call as soon as possible. Messages left after 4 pm will be answered the following business day.   You may also send us a message via MyChart. We typically respond to MyChart messages within 1-2 business days.  For prescription refills, please ask your pharmacy to contact our office. Our fax number is [phone].  If you have an urgent issue when the clinic is closed that cannot wait until the next business day, you can page your doctor at the number below.    Please note that while we do our best to be available for urgent issues outside of office hours, we are not available 24/7.   If you have an urgent issue and are unable to reach us, you may choose to seek medical care at your doctor's office, retail clinic, urgent care center, or emergency room.  If you have a medical emergency, please immediately call 911 or go to the emergency department.  Pager Numbers  - Dr. Kowalski: [phone]  - Dr. Moye: [phone]  - Dr. Stewart: [phone]  In the event of inclement weather, please call our main line at   [phone] for an update on the status of any delays or closures.  Dermatology Medication Tips: Please keep the boxes that topical medications come in in order to help keep track of the instructions about where and how to use these. Pharmacies typically print the medication instructions only on the boxes and not directly on the medication tubes.   If your medication is too expensive, please contact our office at [phone] option 4 or send us a message through MyChart.   We are unable to tell what your co-pay for medications will be in  advance as this is different depending on your insurance coverage. However, we may be able to find a substitute medication at lower cost or fill out paperwork to get insurance to cover a needed medication.   If a prior authorization is required to get your medication covered by your insurance company, please allow us 1-2 business days to complete this process.  Drug prices often vary depending on where the prescription is filled and some pharmacies may offer cheaper prices.  The website www.goodrx.com contains coupons for medications through different pharmacies. The prices here do not account for what the cost may be with help from insurance (it may be cheaper with your insurance), but the website can give you the price if you did not use any insurance.  - You can print the associated coupon and take it with your prescription to the pharmacy.  - You may also stop by our office during regular business hours and pick up a GoodRx coupon card.  - If you need your prescription sent electronically to a different pharmacy, notify our office through [facility] MyChart or by phone at [phone] option 4.     Si Usted Necesita Algo Despus de Su Visita  Tambin puede enviarnos un mensaje a travs de MyChart. Por lo general respondemos a los mensajes de MyChart en el transcurso de 1 a 2 das hbiles.  Para renovar recetas, por favor pida a su farmacia que se ponga en contacto con nuestra oficina. Nuestro nmero de fax es el [phone].  Si tiene un asunto urgente cuando la clnica est cerrada y que no puede esperar hasta el siguiente da hbil, puede llamar/localizar a su doctor(a) al nmero que aparece a continuacin.   Por favor, tenga en cuenta que aunque hacemos todo lo posible para estar disponibles para asuntos urgentes fuera del horario de oficina, no estamos disponibles las 24 horas del da, los 7 das de la semana.   Si tiene un problema urgente y no puede comunicarse con nosotros, puede  optar por buscar atencin mdica  en el consultorio de su doctor(a), en una clnica privada, en un centro de atencin urgente o en una sala de emergencias.  Si tiene una emergencia mdica, por favor llame inmediatamente al 911 o vaya a la sala de emergencias.  Nmeros de bper  - Dr. Kowalski: [phone]  - Dra. Moye: [phone]  - Dra. Stewart: [phone]  En caso de inclemencias del tiempo, por favor llame a nuestra lnea principal al [phone] para una actualizacin sobre el estado de cualquier retraso o cierre.  Consejos para la medicacin en dermatologa: Por favor, guarde las cajas en las que vienen los medicamentos de uso tpico para ayudarle a seguir las instrucciones sobre dnde y cmo usarlos. Las farmacias generalmente imprimen las instrucciones del medicamento slo en las cajas y no directamente en los tubos del medicamento.   Si su medicamento es muy caro, por favor, pngase en contacto con   nuestra oficina llamando al [phone] y presione la opcin 4 o envenos un mensaje a travs de MyChart.   No podemos decirle cul ser su copago por los medicamentos por adelantado ya que esto es diferente dependiendo de la cobertura de su seguro. Sin embargo, es posible que podamos encontrar un medicamento sustituto a menor costo o llenar un formulario para que el seguro cubra el medicamento que se considera necesario.   Si se requiere una autorizacin previa para que su compaa de seguros cubra su medicamento, por favor permtanos de 1 a 2 das hbiles para completar este proceso.  Los precios de los medicamentos varan con frecuencia dependiendo del lugar de dnde se surte la receta y alguna farmacias pueden ofrecer precios ms baratos.  El sitio web www.goodrx.com tiene cupones para medicamentos de diferentes farmacias. Los precios aqu no tienen en cuenta lo que podra costar con la ayuda del seguro (puede ser ms barato con su seguro), pero el sitio web puede darle el  precio si no utiliz ningn seguro.  - Puede imprimir el cupn correspondiente y llevarlo con su receta a la farmacia.  - Tambin puede pasar por nuestra oficina durante el horario de atencin regular y recoger una tarjeta de cupones de GoodRx.  - Si necesita que su receta se enve electrnicamente a una farmacia diferente, informe a nuestra oficina a travs de MyChart de [facility] o por telfono llamando al [phone] y presione la opcin 4.  

## 2022-06-06 ENCOUNTER — Ambulatory Visit: Payer: PPO | Admitting: Speech Pathology

## 2022-06-06 NOTE — Telephone Encounter (Signed)
Pt spouse called in to book an appt w/ provider. I booked him for 4/1 @8 :30am.

## 2022-06-08 ENCOUNTER — Ambulatory Visit: Payer: PPO | Admitting: Speech Pathology

## 2022-06-12 ENCOUNTER — Encounter: Payer: Self-pay | Admitting: Internal Medicine

## 2022-06-12 ENCOUNTER — Ambulatory Visit (INDEPENDENT_AMBULATORY_CARE_PROVIDER_SITE_OTHER): Payer: PPO | Admitting: Internal Medicine

## 2022-06-12 VITALS — BP 124/64 | HR 56 | Temp 97.4°F | Ht 74.0 in | Wt 167.0 lb

## 2022-06-12 DIAGNOSIS — F0283 Dementia in other diseases classified elsewhere, unspecified severity, with mood disturbance: Secondary | ICD-10-CM

## 2022-06-12 DIAGNOSIS — F411 Generalized anxiety disorder: Secondary | ICD-10-CM

## 2022-06-12 DIAGNOSIS — F03B11 Unspecified dementia, moderate, with agitation: Secondary | ICD-10-CM

## 2022-06-12 DIAGNOSIS — R471 Dysarthria and anarthria: Secondary | ICD-10-CM | POA: Diagnosis not present

## 2022-06-12 DIAGNOSIS — S81802A Unspecified open wound, left lower leg, initial encounter: Secondary | ICD-10-CM | POA: Insufficient documentation

## 2022-06-12 DIAGNOSIS — G309 Alzheimer's disease, unspecified: Secondary | ICD-10-CM

## 2022-06-12 DIAGNOSIS — F430 Acute stress reaction: Secondary | ICD-10-CM | POA: Diagnosis not present

## 2022-06-12 MED ORDER — LEVOTHYROXINE SODIUM 100 MCG PO TABS: ORAL_TABLET | ORAL | 3 refills | Status: DC

## 2022-06-12 MED ORDER — ROSUVASTATIN CALCIUM 10 MG PO TABS: 10.0000 mg | ORAL_TABLET | ORAL | 3 refills | Status: DC

## 2022-06-12 NOTE — Assessment & Plan Note (Addendum)
Reducng his dose of paroxetine  to reduce daytime somnolence.  and addin qhs  prn alprazolam for insomnia.  counselling given about his choice of reading material , changing his perspective to manage his anxiety

## 2022-06-12 NOTE — Assessment & Plan Note (Signed)
Traumatic ,occur to left lower calf 2 weeks ago.  Wound care outlined with directions to keep covered, and stop using vaseline as it appears macerated

## 2022-06-12 NOTE — Patient Instructions (Addendum)
1) Your wound is healing well.  No need to change dressing twice daily anymore,  once daily is plenty  do not use vaseline unless it starts to look dry   2) Regarding the anxiety and insomnia:  -Reduce the paroxetine  to to 1/2 tablet  daily   -You may use 1/2 alprazolam at bedtime if needed    -For the last half hour of your reading,  switch from [employer] or [employer]

## 2022-06-12 NOTE — Assessment & Plan Note (Signed)
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  He was receiving speech therapy with Happi Rutherford Nail until recently and his speech has improved.
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
## 2022-06-13 ENCOUNTER — Telehealth: Payer: Self-pay

## 2022-06-13 ENCOUNTER — Ambulatory Visit: Payer: PPO | Admitting: Speech Pathology

## 2022-06-13 ENCOUNTER — Encounter: Payer: Self-pay | Admitting: Internal Medicine

## 2022-06-13 DIAGNOSIS — R29898 Other symptoms and signs involving the musculoskeletal system: Secondary | ICD-10-CM | POA: Diagnosis not present

## 2022-06-13 DIAGNOSIS — R2689 Other abnormalities of gait and mobility: Secondary | ICD-10-CM | POA: Diagnosis not present

## 2022-06-13 DIAGNOSIS — R2681 Unsteadiness on feet: Secondary | ICD-10-CM | POA: Diagnosis not present

## 2022-06-13 NOTE — Telephone Encounter (Signed)
-----   Message from [name], MD sent at 06/13/2022 11:04 AM EDT ----- Skin , neck - anterior NO RESIDUAL DYSPLASTIC NEVUS, MARGINS FREE  S/p repeat shave removal severe dysplastic nevus, margins free  - please call patient

## 2022-06-13 NOTE — Telephone Encounter (Signed)
Advised pt of bx results/sh ?

## 2022-06-15 ENCOUNTER — Ambulatory Visit: Payer: PPO | Admitting: Speech Pathology

## 2022-06-19 DIAGNOSIS — M67834 Other specified disorders of tendon, left wrist: Secondary | ICD-10-CM | POA: Diagnosis not present

## 2022-06-19 DIAGNOSIS — M25532 Pain in left wrist: Secondary | ICD-10-CM | POA: Diagnosis not present

## 2022-06-20 ENCOUNTER — Ambulatory Visit: Payer: PPO | Admitting: Speech Pathology

## 2022-06-20 NOTE — Progress Notes (Unsigned)
Cardiology Office Note:    Date:  06/20/2022   ID:  Theodore [name], DOB 01/18/1930, MRN [id number]  PCP:  [name], MD   [facility] Providers Cardiologist:  [name], MD { Click to update primary MD,subspecialty MD or APP then REFRESH:1}    Referring MD: [name], MD   No chief complaint on file. ***  History of Present Illness:    Theodore [name] is a 89 y.o. male with a hx of CAD, PAD, PVCs, aortic atherosclerosis, COPD, GERD, hypothyroidism, mild cognitive impairment, CKD stage IIIb, hyperlipidemia, protein calorie malnutrition.  Most recently he was evaluated by Dr. Mariah Milling on 02/15/2021.  Past Medical History:  Diagnosis Date   Actinic keratosis    Arthritis    knees   Asymptomatic Sinus bradycardia    a. 05/2016 Zio: Avg HR 61 (41-167).   Benign essential tremor    Cancer 2002   melanoma right ear   Colon polyp    COPD (chronic obstructive pulmonary disease)    pt said he believes it is a misdiagnosis    Coronary artery calcification seen on CT scan    a. 06/2016 CTA Chest: cor Ca2+; b. 05/2017 MV: EF >65%. No ischemia/infarct.   Depression    Dysplastic nevus 04/25/2022   neck - anterior, severe, shave removal 06/05/22   History of echocardiogram    a. 04/2016 Echo: EF 60-65%, mild conc LVH. Nl PASP.   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 05/2016 Zio: Avg HR 61 (41-167). 9 SVT runs (fastest 167 - 5 beats; longest 8 beats - 101 bpm). Rare PACs/PVCs.   PVC's (premature ventricular contractions)    Rhinitis    Sepsis due to cellulitis 11/28/2021   Severe sepsis 11/28/2021   Wears dentures    partial upper and  lower   Wears hearing aid in both ears     Past Surgical History:  Procedure Laterality Date   ADENOIDECTOMY     CATARACT EXTRACTION W/PHACO Left 10/22/2017   Procedure: CATARACT EXTRACTION PHACO AND INTRAOCULAR LENS PLACEMENT (IOC)  LEFT;  Surgeon: Nevada Crane, MD;  Location: [facility];  Service: Ophthalmology;  Laterality: Left;   CATARACT EXTRACTION W/PHACO Right 11/20/2017   Procedure: CATARACT EXTRACTION PHACO AND INTRAOCULAR LENS PLACEMENT (IOC) RIGHT;  Surgeon: Nevada Crane, MD;  Location: [facility];  Service: Ophthalmology;  Laterality: Right;   ESOPHAGOGASTRODUODENOSCOPY (EGD) WITH PROPOFOL N/A 10/31/2016   Procedure: ESOPHAGOGASTRODUODENOSCOPY (EGD) WITH PROPOFOL;  Surgeon: Christena Deem, MD;  Location: [facility];  Service: Endoscopy;  Laterality: N/A;   I & D EXTREMITY Right 11/29/2021   Procedure: IRRIGATION AND DEBRIDEMENT EXTREMITY;  Surgeon: Henrene Dodge, MD;  Location: [facility] ORS;  Service: General;  Laterality: Right;   JOINT REPLACEMENT Right 2003   knee   knee meniscus repair Right    MELANOMA EXCISION Right    ear. Followed by Dr. Gwen Pounds   PATELLECTOMY Bilateral    TONSILLECTOMY      Current Medications: No outpatient medications have been marked as taking for  the 06/21/22 encounter (Appointment) with Creig Hines, NP.     Allergies:   Penicillins, Benzonatate, Lexapro [escitalopram], and Molnupiravir   Social History   Socioeconomic History   Marital status: Married    Spouse name: Theodore [name]   Number of children: 4   Years of education: 16   Highest education level: Not on file  Occupational History   Occupation: Retired    Comment: Holiday representative -   Tobacco Use   Smoking status: Never   Smokeless tobacco: Never  Vaping Use   Vaping Use: Never used  Substance and Sexual Activity   Alcohol use: Yes    Comment: 1 glass of wine daily   Drug use: No   Sexual activity: Not Currently  Other Topics Concern    Not on file  Social History Narrative   Mr. Prejean grew up in [place], [place]. He attended [employer] in [place] and obtained his Oncologist in [employer]. 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 He and his wife are living at [facility]. He is very active at [facility]. He enjoys music.    Social Determinants of Health   Financial Resource Strain: Low Risk  (08/05/2021)   Overall Financial Resource Strain (CARDIA)    Difficulty of Paying Living Expenses: Not hard at all  Food Insecurity: No Food Insecurity (11/28/2021)   Hunger Vital Sign    Worried About Running Out of Food in the Last Year: Never true    Ran Out of Food in the Last Year: Never true  Transportation Needs: No Transportation Needs (11/28/2021)   PRAPARE - Administrator, Civil Service (Medical): No    Lack of Transportation (Non-Medical): No  Physical Activity: Insufficiently Active (08/05/2021)   Exercise Vital Sign    Days of Exercise per Week: 4 days    Minutes of Exercise per Session: 30 min  Stress: No Stress Concern Present (08/05/2021)   [employer] of Occupational Health - Occupational Stress Questionnaire    Feeling of Stress : Only a little  Social Connections: Socially Integrated (08/05/2021)   Social Connection and Isolation Panel [NHANES]    Frequency of Communication with Friends and Family: More than three times a week    Frequency of Social Gatherings with Friends and Family: Once a week    Attends Religious Services: 1 to 4 times per year    Active Member of [employer] or Organizations: Yes    Attends Engineer, structural: More than 4 times per year    Marital Status: Married     Family History: The patient's ***family history includes Cancer in his sister; Heart disease in his father and mother; Hypertension in his mother.  ROS:    Please see the history of present illness.    *** All other systems reviewed and are negative.  EKGs/Labs/Other Studies Reviewed:    The following studies were reviewed today: ***  EKG:  EKG is *** ordered today.  The ekg ordered today demonstrates ***  Recent Labs: 12/05/2021: Magnesium 2.1 05/03/2022: ALT 16; BUN 29; Creatinine, Ser 1.25; Hemoglobin 15.2; Platelets 254.0; Potassium 4.7; Sodium 142; TSH 1.37  Recent Lipid Panel    Component Value Date/Time   CHOL 197 09/21/2020 0934   TRIG 70.0 09/21/2020 0934   HDL 62.00 09/21/2020 0934   CHOLHDL 3 09/21/2020 0934   VLDL 14.0 09/21/2020 0934  LDLCALC 121 (H) 09/21/2020 0934     Risk Assessment/Calculations:   {Does this patient have ATRIAL FIBRILLATION?[phone]}  No BP recorded.  {Refresh Note OR Click here to enter BP  :1}***         Physical Exam:    VS:  There were no vitals taken for this visit.    Wt Readings from Last 3 Encounters:  06/12/22 167 lb (75.8 kg)  05/03/22 167 lb 9.6 oz (76 kg)  04/04/22 168 lb 9.6 oz (76.5 kg)     GEN: *** Well nourished, well developed in no acute distress HEENT: Normal NECK: No JVD; No carotid bruits LYMPHATICS: No lymphadenopathy CARDIAC: ***RRR, no murmurs, rubs, gallops RESPIRATORY:  Clear to auscultation without rales, wheezing or rhonchi  ABDOMEN: Soft, non-tender, non-distended MUSCULOSKELETAL:  No edema; No deformity  SKIN: Warm and dry NEUROLOGIC:  Alert and oriented x 3 PSYCHIATRIC:  Normal affect   ASSESSMENT:    No diagnosis found. PLAN:    In order of problems listed above:  ***      {Are you ordering a CV Procedure (e.g. stress test, cath, DCCV, TEE, etc)?   Press F2        :[id number]}    Medication Adjustments/Labs and Tests Ordered: Current medicines are reviewed at length with the patient today.  Concerns regarding medicines are outlined above.  No orders of the defined types were placed in this encounter.  No orders of the defined  types were placed in this encounter.   There are no Patient Instructions on file for this visit.   Signed, Flossie DibbleJennifer C Flannery Cavallero, NP  06/20/2022 7:40 PM    [facility]

## 2022-06-21 ENCOUNTER — Encounter: Payer: Self-pay | Admitting: Nurse Practitioner

## 2022-06-21 ENCOUNTER — Other Ambulatory Visit
Admission: RE | Admit: 2022-06-21 | Discharge: 2022-06-21 | Disposition: A | Discharge status: Home / Self Care | Payer: PPO | Source: Ambulatory Visit | Attending: Nurse Practitioner | Admitting: Nurse Practitioner

## 2022-06-21 ENCOUNTER — Ambulatory Visit (INDEPENDENT_AMBULATORY_CARE_PROVIDER_SITE_OTHER): Payer: PPO

## 2022-06-21 ENCOUNTER — Ambulatory Visit: Payer: PPO | Attending: Nurse Practitioner | Admitting: Cardiology

## 2022-06-21 VITALS — BP 100/50 | HR 44 | Ht 75.0 in | Wt 171.0 lb

## 2022-06-21 DIAGNOSIS — I25119 Atherosclerotic heart disease of native coronary artery with unspecified angina pectoris: Secondary | ICD-10-CM | POA: Insufficient documentation

## 2022-06-21 DIAGNOSIS — I700 Atherosclerosis of aorta: Secondary | ICD-10-CM

## 2022-06-21 DIAGNOSIS — N1832 Chronic kidney disease, stage 3b: Secondary | ICD-10-CM | POA: Diagnosis not present

## 2022-06-21 DIAGNOSIS — R55 Syncope and collapse: Secondary | ICD-10-CM | POA: Diagnosis not present

## 2022-06-21 DIAGNOSIS — R001 Bradycardia, unspecified: Secondary | ICD-10-CM

## 2022-06-21 DIAGNOSIS — I739 Peripheral vascular disease, unspecified: Secondary | ICD-10-CM

## 2022-06-21 DIAGNOSIS — E782 Mixed hyperlipidemia: Secondary | ICD-10-CM

## 2022-06-21 LAB — CBC
HCT: 42.2 % (ref 39.0–52.0)
Hemoglobin: 13.7 g/dL (ref 13.0–17.0)
MCH: 31.3 pg (ref 26.0–34.0)
MCHC: 32.5 g/dL (ref 30.0–36.0)
MCV: 96.3 fL (ref 80.0–100.0)
Platelets: 217 10*3/uL (ref 150–400)
RBC: 4.38 MIL/uL (ref 4.22–5.81)
RDW: 14.1 % (ref 11.5–15.5)
WBC: 6.8 10*3/uL (ref 4.0–10.5)
nRBC: 0 % (ref 0.0–0.2)

## 2022-06-21 LAB — BASIC METABOLIC PANEL
Anion gap: 6 (ref 5–15)
BUN: 35 mg/dL — ABNORMAL HIGH (ref 8–23)
CO2: 29 mmol/L (ref 22–32)
Calcium: 8.9 mg/dL (ref 8.9–10.3)
Chloride: 105 mmol/L (ref 98–111)
Creatinine, Ser: 1.22 mg/dL (ref 0.61–1.24)
GFR, Estimated: 56 mL/min — ABNORMAL LOW (ref >60)
Glucose, Bld: 96 mg/dL (ref 70–99)
Potassium: 4.8 mmol/L (ref 3.5–5.1)
Sodium: 140 mmol/L (ref 135–145)

## 2022-06-21 NOTE — Patient Instructions (Signed)
Medication Instructions:  Your physician recommends that you continue on your current medications as directed. Please refer to the Current Medication list given to you today.  *If you need a refill on your cardiac medications before your next appointment, please call your pharmacy*  Lab Work: Your physician recommends that you get lab work today: [facility] Entrance at [facility] 1st desk on the right to check in (REGISTRATION)  Lab hours: Monday- Friday (7:30 am- 5:30 pm)  If you have labs (blood work) drawn today and your tests are completely normal, you will receive your results only by: MyChart Message (if you have MyChart) OR A paper copy in the mail If you have any lab test that is abnormal or we need to change your treatment, we will call you to review the results.  Testing/Procedures: Heart Monitor:  Length of Wear: 14 days  Your monitor will be mailed to your home address within 3-5 business days. However, if you have not received your monitor after 5 business days please send [place] a MyChart message or call the office at [phone], so we may follow up on this for you.   Your physician has recommended that you wear a Zio heart monitor.   This monitor is a medical device that records the heart's electrical activity. Doctors most often use these monitors to diagnose arrhythmias. Arrhythmias are problems with the speed or rhythm of the heartbeat. The monitor is a small device applied to your chest. You can wear one while you do your normal daily activities. While wearing this monitor if you have any symptoms to push the button and record what you felt. Once you have worn this monitor for the period of time provider prescribed (Usually 14 days), you will return the monitor device in the postage paid box. Once it is returned they will download the data collected and provide [place] with a report which the provider will then review and we will call you with those results. Important  tips:  Avoid showering during the first 24 hours of wearing the monitor. Avoid excessive sweating to help maximize wear time. Do not submerge the device, no hot tubs, and no swimming pools. Keep any lotions or oils away from the patch. After 24 hours you may shower with the patch on. Take brief showers with your back facing the shower head.  Do not remove patch once it has been placed because that will interrupt data and decrease adhesive wear time. Push the button when you have any symptoms and write down what you were feeling. Once you have completed wearing your monitor, remove and place into box which has postage paid and place in your outgoing mailbox.  If for some reason you have misplaced your box then call our office and we can provide another box and/or mail it off for you.       Follow-Up: At [facility], you and your health needs are our priority.  As part of our continuing mission to provide you with exceptional heart care, we have created designated Provider Care Teams.  These Care Teams include your primary Cardiologist (physician) and Advanced Practice Providers (APPs -  Physician Assistants and Nurse Practitioners) who all work together to provide you with the care you need, when you need it.  We recommend signing up for the patient portal called "MyChart".  Sign up information is provided on this After Visit Summary.  MyChart is used to connect with patients for Virtual Visits (Telemedicine).  Patients are able to view lab/test results, encounter notes, upcoming appointments, etc.  Non-urgent messages can be sent to your provider as well.   To learn more about what you can do with MyChart, go to [email or web address].    Your next appointment:   8 week(s)  Provider:   You may see [name], MD or one of the following Advanced Practice Providers on your designated Care Team:   Nicolasa Ducking, NP Eula Listen, PA-C Cadence Fransico Michael, PA-C Charlsie Quest, NP   Other Instructions -None

## 2022-06-22 ENCOUNTER — Ambulatory Visit: Payer: PPO | Admitting: Speech Pathology

## 2022-06-22 ENCOUNTER — Ambulatory Visit: Payer: PPO | Admitting: Physician Assistant

## 2022-06-26 DIAGNOSIS — I25119 Atherosclerotic heart disease of native coronary artery with unspecified angina pectoris: Secondary | ICD-10-CM | POA: Diagnosis not present

## 2022-06-26 DIAGNOSIS — R55 Syncope and collapse: Secondary | ICD-10-CM | POA: Diagnosis not present

## 2022-06-26 DIAGNOSIS — I739 Peripheral vascular disease, unspecified: Secondary | ICD-10-CM | POA: Diagnosis not present

## 2022-06-27 ENCOUNTER — Ambulatory Visit: Payer: PPO | Admitting: Speech Pathology

## 2022-06-28 DIAGNOSIS — M67834 Other specified disorders of tendon, left wrist: Secondary | ICD-10-CM | POA: Diagnosis not present

## 2022-06-29 ENCOUNTER — Ambulatory Visit: Payer: PPO | Admitting: Speech Pathology

## 2022-07-04 ENCOUNTER — Ambulatory Visit: Payer: PPO | Admitting: Speech Pathology

## 2022-07-06 ENCOUNTER — Ambulatory Visit: Payer: PPO | Admitting: Speech Pathology

## 2022-07-07 DIAGNOSIS — R29898 Other symptoms and signs involving the musculoskeletal system: Secondary | ICD-10-CM | POA: Diagnosis not present

## 2022-07-07 DIAGNOSIS — M6530 Trigger finger, unspecified finger: Secondary | ICD-10-CM | POA: Diagnosis not present

## 2022-07-07 DIAGNOSIS — M545 Low back pain, unspecified: Secondary | ICD-10-CM | POA: Diagnosis not present

## 2022-07-07 DIAGNOSIS — G8929 Other chronic pain: Secondary | ICD-10-CM | POA: Diagnosis not present

## 2022-07-07 DIAGNOSIS — R4189 Other symptoms and signs involving cognitive functions and awareness: Secondary | ICD-10-CM | POA: Diagnosis not present

## 2022-07-07 DIAGNOSIS — G250 Essential tremor: Secondary | ICD-10-CM | POA: Diagnosis not present

## 2022-07-07 DIAGNOSIS — R2689 Other abnormalities of gait and mobility: Secondary | ICD-10-CM | POA: Diagnosis not present

## 2022-07-08 ENCOUNTER — Other Ambulatory Visit: Payer: Self-pay | Admitting: Cardiovascular Disease

## 2022-07-11 ENCOUNTER — Ambulatory Visit: Payer: PPO | Admitting: Speech Pathology

## 2022-07-12 ENCOUNTER — Ambulatory Visit (INDEPENDENT_AMBULATORY_CARE_PROVIDER_SITE_OTHER): Payer: PPO | Admitting: Internal Medicine

## 2022-07-12 ENCOUNTER — Encounter: Payer: Self-pay | Admitting: Internal Medicine

## 2022-07-12 VITALS — BP 110/54 | HR 51 | Temp 97.4°F | Ht 75.0 in | Wt 172.6 lb

## 2022-07-12 DIAGNOSIS — F411 Generalized anxiety disorder: Secondary | ICD-10-CM | POA: Diagnosis not present

## 2022-07-12 DIAGNOSIS — M6281 Muscle weakness (generalized): Secondary | ICD-10-CM | POA: Diagnosis not present

## 2022-07-12 DIAGNOSIS — R2689 Other abnormalities of gait and mobility: Secondary | ICD-10-CM

## 2022-07-12 DIAGNOSIS — R296 Repeated falls: Secondary | ICD-10-CM | POA: Diagnosis not present

## 2022-07-12 DIAGNOSIS — E038 Other specified hypothyroidism: Secondary | ICD-10-CM

## 2022-07-12 DIAGNOSIS — F430 Acute stress reaction: Secondary | ICD-10-CM | POA: Diagnosis not present

## 2022-07-12 DIAGNOSIS — G309 Alzheimer's disease, unspecified: Secondary | ICD-10-CM | POA: Diagnosis not present

## 2022-07-12 DIAGNOSIS — F0283 Dementia in other diseases classified elsewhere, unspecified severity, with mood disturbance: Secondary | ICD-10-CM | POA: Diagnosis not present

## 2022-07-12 DIAGNOSIS — R7989 Other specified abnormal findings of blood chemistry: Secondary | ICD-10-CM | POA: Diagnosis not present

## 2022-07-12 MED ORDER — SERTRALINE HCL 50 MG PO TABS: 50.0000 mg | ORAL_TABLET | Freq: Every day | ORAL | 1 refills | Status: DC

## 2022-07-12 NOTE — Assessment & Plan Note (Signed)
He continues to benefit from use of testosterone for QOL issues and is aware that his presumed prostate CA may be accelerated by use of exogenous testosterone. However given his age and his recent decline in PSA values,  he prefers to continue testosterone.  

## 2022-07-12 NOTE — Progress Notes (Signed)
Subjective:  Patient ID: Theodore [name], male    DOB: May 18, 1929  Age: 89 y.o. MRN: [id number]  CC: The primary encounter diagnosis was Proximal muscle weakness. 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   HPI Theodore [name] presents for  Chief Complaint  Patient presents with   Medical Management of Chronic Issues    Follow up on anxiety   One month follow up on anxiety /dementia.  Paxil dose was reduced and qhs alprazolam was added for agitation at night .  He had a meeting with Dr Sherryll Burger, memantine was added at starting dose of 5 mg with increase to 10 mg planned on May 3.  He also suggested stopping paxil,  due to anticholinergic side effects.  (patient was previously tolerant of zoloft )   [name] notes that the flailing of arms has improved,  but the talking in his sleep is still waking her up at night. Apparently several family members )(daughter and son) have been sleep talkers   GAD:  STILL carries guilt about his divorce 's effect on his daughter  who was 8 yrs old at the time.  Recent visit by his daughter was emotionally bittersweet  aggravated by her desire to look  at old family photographs.   We dicussed potential of counselling (resuming  counselling )   Bradycardia:  completed a 2 week XIO monitor yesterday (ordered by [facility])   Recurrent falls over the last month due to loss of balance when bending over.  Falling backwards,  .  Needs PT referral  to [facility].   Folate deficiency noted in February , supplementing since then, .  Taking testosterone injections every 14 days,  proximal muscle weakness   Lab Results  Component Value Date   TESTOSTERONE 439 01/17/2021        Outpatient Medications Prior to Visit  Medication Sig Dispense Refill   ALPRAZolam (XANAX) 0.25 MG tablet Take  1 tablet (0.25 mg total) by mouth 2 (two) times daily as needed for anxiety or sleep. 60 tablet 2   ascorbic acid (VITAMIN C) 500 MG tablet Take 500 mg by mouth daily.     Cholecalciferol (VITAMIN D3) 50 MCG (2000 UT) TABS Take 1 tablet by mouth daily.     cycloSPORINE (RESTASIS) 0.05 % ophthalmic emulsion Place 1 drop into both eyes 2 (two) times daily.     ezetimibe (ZETIA) 10 MG tablet TAKE 1 TABLET BY MOUTH EVERY DAY 60 tablet 0   fluticasone (FLONASE) 50 MCG/ACT nasal spray SPRAY 2 SPRAYS INTO EACH NOSTRIL EVERY DAY 48 mL 1   levothyroxine (SYNTHROID) 100 MCG tablet TAKE 1 TABLET BY MOUTH EVERY DAY BEFORE BREAKFAST 90 tablet 3   magnesium oxide (MAG-OX) 400 MG tablet Take 400 mg by mouth daily.     memantine (NAMENDA) 5 MG tablet Take 5 mg by mouth 2 (two) times daily.     OVER THE COUNTER MEDICATION Folate 1360 mcg 1 Daily     rosuvastatin (CRESTOR) 10 MG tablet Take 1 tablet (10 mg total) by mouth every other day. 45 tablet 3   Saccharomyces boulardii (PROBIOTIC) 250 MG CAPS Take 1 capsule by mouth daily at 12 noon.     testosterone cypionate (DEPOTESTOSTERONE CYPIONATE) 200 MG/ML injection INJECT 0.5 ML (100 MG TOTAL) INTO THE MUSCLE EVERY 14 DAYS. 1 mL 2  vitamin B-12 (CYANOCOBALAMIN) 500 MCG tablet Take 1 tablet by mouth daily.     PARoxetine (PAXIL) 30 MG tablet Take 1 tablet (30 mg total) by mouth daily. 90 tablet 1   No facility-administered medications prior to visit.    Review of Systems;  Patient denies headache, fevers, malaise, unintentional weight loss, skin rash, eye pain, sinus congestion and sinus pain, sore throat, dysphagia,  hemoptysis , cough, dyspnea, wheezing, chest pain, palpitations, orthopnea, edema, abdominal pain, nausea, melena, diarrhea, constipation, flank pain, dysuria, hematuria, urinary  Frequency, nocturia, numbness, tingling, seizures,  Focal weakness, Loss of consciousness,  Tremor, insomnia, depression, anxiety, and suicidal ideation.       Objective:  BP (!) 110/54   Pulse (!) 51   Temp (!) 97.4 F (36.3 C) (Oral)   Ht 6\' 3"  (1.905 m)   Wt 172 lb 9.6 oz (78.3 kg)   SpO2 96%   BMI 21.57 kg/m   BP Readings from Last 3 Encounters:  07/12/22 (!) 110/54  06/21/22 (!) 100/50  06/12/22 124/64    Wt Readings from Last 3 Encounters:  07/12/22 172 lb 9.6 oz (78.3 kg)  06/21/22 171 lb (77.6 kg)  06/12/22 167 lb (75.8 kg)    Physical Exam Vitals reviewed.  Constitutional:      General: He is not in acute distress.    Appearance: Normal appearance. He is normal weight. He is not ill-appearing, toxic-appearing or diaphoretic.  HENT:     Head: Normocephalic.  Eyes:     General: No scleral icterus.       Right eye: No discharge.        Left eye: No discharge.     Conjunctiva/sclera: Conjunctivae normal.  Cardiovascular:     Rate and Rhythm: Normal rate and regular rhythm.     Heart sounds: Normal heart sounds.  Pulmonary:     Effort: Pulmonary effort is normal. No respiratory distress.     Breath sounds: Normal breath sounds.  Musculoskeletal:        General: Normal range of motion.     Cervical back: Normal range of motion.  Skin:    General: Skin is warm and dry.  Neurological:     General: No focal deficit present.     Mental Status: He is alert and oriented to person, place, and time. Mental status is at baseline.  Psychiatric:        Mood and Affect: Mood normal.        Behavior: Behavior normal.        Thought Content: Thought content normal.        Judgment: Judgment normal.    Lab Results  Component Value Date   HGBA1C 4.8 01/17/2021   HGBA1C 4.7 05/28/2018    Lab Results  Component Value Date   CREATININE 1.22 06/21/2022   CREATININE 1.25 05/03/2022   CREATININE 1.19 01/23/2022    Lab Results  Component Value Date   WBC 6.8 06/21/2022   HGB 13.7 06/21/2022   HCT 42.2 06/21/2022   PLT 217 06/21/2022   GLUCOSE 96 06/21/2022   CHOL 197 09/21/2020   TRIG 70.0 09/21/2020   HDL  62.00 09/21/2020   LDLCALC 121 (H) 09/21/2020   ALT 16 05/03/2022   AST 16 05/03/2022   NA 140 06/21/2022   K 4.8 06/21/2022   CL 105 06/21/2022   CREATININE 1.22 06/21/2022   BUN 35 (H) 06/21/2022   CO2 29 06/21/2022   TSH 1.37 05/03/2022   PSA 21.68 (  H) 02/11/2021   INR 1.4 (H) 11/29/2021   HGBA1C 4.8 01/17/2021    No results found.  Assessment & Plan:  .Proximal muscle weakness Assessment & Plan: Resulting in loss of balance and falls.  Refer to [facility] PT  Orders: -     Ambulatory referral to Physical Therapy  Balance problem -     Ambulatory referral to Physical Therapy  Recurrent falls -     Ambulatory referral to Physical Therapy  Low serum testosterone level Assessment & Plan: He continues to benefit from use of testosterone for QOL issues and is aware that his presumed prostate CA may be accelerated by use of exogenous testosterone. However given his age and his recent decline in PSA values,  he prefers to continue testosterone.    Other specified hypothyroidism Assessment & Plan: Thyroid function is WNL on current dose.  No current changes needed.    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 He did not tolerate trial of aricept .  He is not taking memantine. . Changing paxil to zoloft .  Continue prn use of alprazolam  0.25  mg   Anxiety in acute stress reaction Assessment & Plan: Counselling given.  Change in therapy from paxil to sertraline   Other orders -     Sertraline HCl; Take 1 tablet (50 mg total) by mouth daily.  Dispense: 90 tablet; Refill: 1     I provided 38 minutes of face-to-face time during this encounter reviewing patient's last visit with me, patient's  most recent visit with cardiology,   and neurology,  recent surgical and non surgical procedures, previous  labs and imaging studies, counseling on  currently addressed issues,  and post visit ordering to diagnostics and therapeutics .   Follow-up: Return in about 4 weeks (around 08/09/2022).   [name], MD

## 2022-07-12 NOTE — Assessment & Plan Note (Signed)
Resulting in loss of balance and falls.  Refer to [facility] PT

## 2022-07-12 NOTE — Patient Instructions (Addendum)
Stop the Paxil and start generic zoloft 50 mg daily  STARTING TODAY   Continue alprazolam at bedtime to rest better   Increase the memantine on may 3 as directed by Dr Sherryll Burger  PT referral for loss of balance,  leg weakness  Continue walking or doing some form of exercise EVERY DAY    STOP LIVING IN THE PAST AND WORRYING ABOUT THE FUTURE.  IS IT NOT BIBLICAL AND IMPLIES A LACK OF FAITH THAT GOD HAS FORGIVEN YOU AND HE IS IN CHARGE

## 2022-07-12 NOTE — Assessment & Plan Note (Signed)
Counselling given.  Change in therapy from paxil to sertraline

## 2022-07-12 NOTE — Assessment & Plan Note (Signed)
He has returned  to Dr  Sherryll Burger  . He did not tolerate trial of aricept .  He is not taking memantine. . Changing paxil to zoloft .  Continue prn use of alprazolam  0.25  mg

## 2022-07-12 NOTE — Assessment & Plan Note (Signed)
Thyroid function is WNL on current dose.  No current changes needed.    Lab Results  Component Value Date   TSH 1.37 05/03/2022

## 2022-07-13 ENCOUNTER — Ambulatory Visit: Payer: PPO | Admitting: Speech Pathology

## 2022-07-13 DIAGNOSIS — I739 Peripheral vascular disease, unspecified: Secondary | ICD-10-CM | POA: Diagnosis not present

## 2022-07-13 DIAGNOSIS — I25119 Atherosclerotic heart disease of native coronary artery with unspecified angina pectoris: Secondary | ICD-10-CM | POA: Diagnosis not present

## 2022-07-18 ENCOUNTER — Ambulatory Visit: Payer: PPO | Admitting: Speech Pathology

## 2022-07-20 ENCOUNTER — Other Ambulatory Visit: Payer: Self-pay | Admitting: *Deleted

## 2022-07-20 ENCOUNTER — Ambulatory Visit: Payer: PPO | Admitting: Speech Pathology

## 2022-07-20 DIAGNOSIS — I493 Ventricular premature depolarization: Secondary | ICD-10-CM

## 2022-07-21 ENCOUNTER — Telehealth: Payer: Self-pay | Admitting: Nurse Practitioner

## 2022-07-21 NOTE — Telephone Encounter (Signed)
-----   Message from Sandi Mariscal, RN sent at 07/20/2022  3:51 PM EDT ----- The patient needs to be set up for an echo-thank you

## 2022-07-21 NOTE — Telephone Encounter (Signed)
Left voice mail to schedule appt

## 2022-07-23 ENCOUNTER — Ambulatory Visit
Admission: EM | Admit: 2022-07-23 | Discharge: 2022-07-23 | Disposition: A | Discharge status: Home / Self Care | Payer: PPO | Attending: Urgent Care | Admitting: Urgent Care

## 2022-07-23 DIAGNOSIS — H109 Unspecified conjunctivitis: Secondary | ICD-10-CM | POA: Diagnosis not present

## 2022-07-23 DIAGNOSIS — M542 Cervicalgia: Secondary | ICD-10-CM | POA: Diagnosis not present

## 2022-07-23 DIAGNOSIS — R051 Acute cough: Secondary | ICD-10-CM | POA: Diagnosis not present

## 2022-07-23 MED ORDER — MOXIFLOXACIN HCL 0.5 % OP SOLN: 1.0000 [drp] | Freq: Three times a day (TID) | OPHTHALMIC | 0 refills | Status: AC

## 2022-07-23 NOTE — ED Provider Notes (Signed)
Theodore [name]    CSN: [id number] Arrival date & time: 07/23/22  1030      History   Chief Complaint Chief Complaint  Patient presents with   Eye Problem    Pain and discharge in left eye - Entered by patient    HPI Theodore [name] is a 89 y.o. male.    Eye Problem   Presents to urgent care with complaint of left eye pain swelling and drainage x 1 week.  He is using OTC medication for symptom control as well as "Restasis".  He also complains about right-sided neck pain which he thinks is from a muscle strain from lifting his walker into the car.  Also complains of left-sided facial/jaw pain recovering from his chin to the top of his head.  Past Medical History:  Diagnosis Date   Actinic keratosis    Arthritis    knees   Asymptomatic Sinus bradycardia    a. 05/2016 Zio: Avg HR 61 (41-167).   Benign essential tremor    Cancer (HCC) 2002   melanoma right ear   Colon polyp    COPD (chronic obstructive pulmonary disease) (HCC)    pt said he believes it is a misdiagnosis    Coronary artery calcification seen on CT scan    a. 06/2016 CTA Chest: cor Ca2+; b. 05/2017 MV: EF >65%. No ischemia/infarct.   COVID-19 virus infection 04/06/2020   Depression    Dysplastic nevus 04/25/2022   neck - anterior, severe, shave removal 06/05/22   History of echocardiogram    a. 04/2016 Echo: EF 60-65%, mild conc LVH. Nl PASP.   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 05/2016 Zio: Avg HR 61 (41-167). 9 SVT runs (fastest 167 - 5 beats; longest 8 beats - 101 bpm). Rare PACs/PVCs.   PVC's (premature ventricular contractions)    Rhinitis    Sepsis  due to cellulitis (HCC) 11/28/2021   Severe sepsis (HCC) 11/28/2021   Wears dentures    partial upper and lower   Wears hearing aid in both ears     Patient Active Problem List   Diagnosis Date Noted   Wound of left leg, initial encounter 06/12/2022   Sialorrhea 02/02/2022   Protein-calorie malnutrition, moderate (HCC) 11/29/2021   Dementia in Alzheimer's disease with depression (HCC) 11/28/2021   Dyslipidemia 11/28/2021   GERD without esophagitis 11/28/2021   Proximal muscle weakness 08/27/2021   Speech disturbance 08/26/2021   Adverse drug reaction, initial encounter 05/04/2021   Lumbar radiculopathy 04/25/2021   Thoracic aortic atherosclerosis (HCC) 01/17/2021   Anxiety in acute stress reaction 12/07/2020   Elevated PSA, between 10 and less than 20 ng/ml 11/30/2020   Chronic kidney disease, stage 3b (HCC) 11/10/2020   Benign hypertensive kidney disease with chronic kidney disease 11/10/2020   Acquired trigger finger of left middle finger 08/07/2019   Carpal tunnel syndrome of right wrist 07/10/2019   Ulnar neuropathy 06/05/2019   Localized, secondary osteoarthritis of the shoulder region 01/29/2019   Osteoarthritis of knee 01/29/2019   Chronic obstructive pulmonary disease (  HCC) 09/24/2018   Cervical radiculopathy 08/14/2018   PAD (peripheral artery disease) (HCC)    Hypothyroidism 07/04/2014   Nonallergic vasomotor rhinitis 07/01/2014   Hypertrophy of prostate with urinary obstruction and other lower urinary tract symptoms (LUTS) 08/08/2013   Low serum testosterone level 04/28/2013   Actinic keratosis 05/10/2010   History of malignant melanoma of skin 05/10/2010    Past Surgical History:  Procedure Laterality Date   ADENOIDECTOMY     CATARACT EXTRACTION W/PHACO Left 10/22/2017   Procedure: CATARACT EXTRACTION PHACO AND INTRAOCULAR LENS PLACEMENT (IOC)  LEFT;  Surgeon: Nevada Crane, MD;  Location: [facility];  Service: Ophthalmology;  Laterality: Left;    CATARACT EXTRACTION W/PHACO Right 11/20/2017   Procedure: CATARACT EXTRACTION PHACO AND INTRAOCULAR LENS PLACEMENT (IOC) RIGHT;  Surgeon: Nevada Crane, MD;  Location: [facility];  Service: Ophthalmology;  Laterality: Right;   ESOPHAGOGASTRODUODENOSCOPY (EGD) WITH PROPOFOL N/A 10/31/2016   Procedure: ESOPHAGOGASTRODUODENOSCOPY (EGD) WITH PROPOFOL;  Surgeon: Christena Deem, MD;  Location: [facility];  Service: Endoscopy;  Laterality: N/A;   I & D EXTREMITY Right 11/29/2021   Procedure: IRRIGATION AND DEBRIDEMENT EXTREMITY;  Surgeon: Henrene Dodge, MD;  Location: [facility];  Service: General;  Laterality: Right;   JOINT REPLACEMENT Right 2003   knee   knee meniscus repair Right    MELANOMA EXCISION Right    ear. Followed by Dr. Gwen Pounds   PATELLECTOMY Bilateral    TONSILLECTOMY         Home Medications    Prior to Admission medications   Medication Sig Start Date End Date Taking? Authorizing Provider  ALPRAZolam (XANAX) 0.25 MG tablet Take 1 tablet (0.25 mg total) by mouth 2 (two) times daily as needed for anxiety or sleep. 03/28/22   [name], MD  ascorbic acid (VITAMIN C) 500 MG tablet Take 500 mg by mouth daily.    [provider]  Cholecalciferol (VITAMIN D3) 50 MCG (2000 UT) TABS Take 1 tablet by mouth daily.    [provider]  cycloSPORINE (RESTASIS) 0.05 % ophthalmic emulsion Place 1 drop into both eyes 2 (two) times daily.    [provider]  ezetimibe (ZETIA) 10 MG tablet TAKE 1 TABLET BY MOUTH EVERY DAY 07/10/22   Antonieta Iba, MD  fluticasone (FLONASE) 50 MCG/ACT nasal spray SPRAY 2 SPRAYS INTO EACH NOSTRIL EVERY DAY 03/14/22   Erin Fulling, MD  levothyroxine (SYNTHROID) 100 MCG tablet TAKE 1 TABLET BY MOUTH EVERY DAY BEFORE BREAKFAST 06/12/22   [name], MD  magnesium oxide (MAG-OX) 400 MG tablet Take 400 mg by mouth daily.    [provider]  memantine (NAMENDA) 5 MG tablet Take 5 mg by mouth 2 (two) times  daily. 07/07/22   [provider]  OVER THE COUNTER MEDICATION Folate 1360 mcg 1 Daily    [provider]  rosuvastatin (CRESTOR) 10 MG tablet Take 1 tablet (10 mg total) by mouth every other day. 06/12/22   [name], MD  Saccharomyces boulardii (PROBIOTIC) 250 MG CAPS Take 1 capsule by mouth daily at 12 noon.    [provider]  sertraline (ZOLOFT) 50 MG tablet Take 1 tablet (50 mg total) by mouth daily. 07/12/22   [name], MD  testosterone cypionate (DEPOTESTOSTERONE CYPIONATE) 200 MG/ML injection INJECT 0.5 ML (100 MG TOTAL) INTO THE MUSCLE EVERY 14 DAYS. 05/02/22   [name], MD  vitamin B-12 (CYANOCOBALAMIN) 500 MCG tablet Take 1 tablet by mouth daily.  [provider]    Family History Family History  Problem Relation Age of Onset   Hypertension Mother    Heart disease Mother        CHF   Heart disease Father    Cancer Sister        melanoma    Social History Social History   Tobacco Use   Smoking status: Never   Smokeless tobacco: Never  Vaping Use   Vaping Use: Never used  Substance Use Topics   Alcohol use: Yes    Comment: 1 glass of wine daily   Drug use: No     Allergies   Penicillins, Benzonatate, Lexapro [escitalopram], and Molnupiravir   Review of Systems Review of Systems   Physical Exam Triage Vital Signs ED Triage Vitals [07/23/22 1124]  Enc Vitals Group     BP      Pulse      Resp      Temp      Temp src      SpO2      Weight      Height      Head Circumference      Peak Flow      Pain Score 6     Pain Loc      Pain Edu?      Excl. in GC?    No data found.  Updated Vital Signs There were no vitals taken for this visit.  Visual Acuity Right Eye Distance:   Left Eye Distance:   Bilateral Distance:    Right Eye Near:   Left Eye Near:    Bilateral Near:     Physical Exam Vitals reviewed.  Constitutional:      Appearance: Normal appearance.  HENT:     Right Ear: Tympanic  membrane and ear canal normal. Tympanic membrane is not erythematous.     Mouth/Throat:     Mouth: Mucous membranes are moist.     Dentition: Normal dentition. No dental tenderness, gingival swelling or gum lesions.     Comments: No evidence of acute oral lesions.  He is missing multiple teeth. Cardiovascular:     Rate and Rhythm: Normal rate and regular rhythm.     Pulses: Normal pulses.     Heart sounds: Normal heart sounds.  Pulmonary:     Effort: Pulmonary effort is normal.     Breath sounds: Normal breath sounds.  Skin:    General: Skin is warm and dry.  Neurological:     General: No focal deficit present.     Mental Status: He is alert and oriented to person, place, and time.  Psychiatric:        Mood and Affect: Mood normal.        Behavior: Behavior normal.      UC Treatments / Results  Labs (all labs ordered are listed, but only abnormal results are displayed) Labs Reviewed - No data to display  EKG   Radiology No results found.  Procedures Procedures (including critical care time)  Medications Ordered in UC Medications - No data to display  Initial Impression / Assessment and Plan / UC Course  I have reviewed the triage vital signs and the nursing notes.  Pertinent labs & imaging results that were available during my care of the patient were reviewed by me and considered in my medical decision making (see chart for details).   Theodore [name] is a 89 y.o. male presenting with L bacterial conjuctivitis. Patient  is afebrile without recent antipyretics, satting well on room air. Overall is well appearing though non-toxic, well hydrated, without respiratory distress. Pulmonary exam is unremarkable.  Lungs CTAB without wheezing, rhonchi, rales. RRR.  No pharyngeal erythema or peritonsillar exudates.  No appearance of acute oral wounds or lesions.  No oral erythema or edema.  Left TM is WNL as is the EAC.  Treating for left acute bacterial conjunctivitis with  moxifloxacin.  Cleaned his eye with normal saline using a gauze pad allowing him to see freely through both eyes and reducing falls risk while leaving clinic and returning home.  Recommended continued use of Tylenol to treat his neck pain which is likely caused by muscle strain.  Unclear cause of his left-sided facial pain.  There is no evidence of infection, no erythema or edema in his ear or mouth.  Possible neuralgia.  Asking him to continue to use Tylenol.  He should's check with his PCP to see if NSAIDs are okay given his kidney status.  Reviewed chart history including notes, labs, imaging.   Counseled patient on potential for adverse effects with medications prescribed/recommended today, ER and return-to-clinic precautions discussed, patient verbalized understanding and agreement with care plan.  Final Clinical Impressions(s) / UC Diagnoses   Final diagnoses:  None   Discharge Instructions   None    ED Prescriptions   None    PDMP not reviewed this encounter.   Charma Igo, [place] 07/23/22 1205

## 2022-07-23 NOTE — ED Triage Notes (Signed)
Patient presents to [facility] for UC for left eye swelling, pain, and drainage x 1 week. States using red eye drops, ibuprofen, and Restasis.

## 2022-07-23 NOTE — Discharge Instructions (Addendum)
Recommend use of Tylenol for your aches and pains.  Talk to your PCP to see if NSAIDs such as ibuprofen or naproxen are okay to use given your kidney status.    Recommending daily use of guaifenesin 1200 mg once a day to help loosen mucus production and make your cough become more effective.  Use the eyedrop prescribed for your left eye 3 times a day as indicated in the prescription.  Follow-up by scheduling a visit with your primary care doctor to evaluate any residual symptoms after today's visit.

## 2022-07-23 NOTE — ED Triage Notes (Signed)
Patient also complains of right sided neck pain since 2 weeks.

## 2022-07-24 ENCOUNTER — Ambulatory Visit: Payer: PPO

## 2022-07-25 ENCOUNTER — Encounter: Payer: Self-pay | Admitting: Internal Medicine

## 2022-07-25 ENCOUNTER — Ambulatory Visit: Payer: PPO | Admitting: Speech Pathology

## 2022-07-27 ENCOUNTER — Ambulatory Visit: Payer: PPO | Admitting: Speech Pathology

## 2022-07-28 ENCOUNTER — Other Ambulatory Visit: Payer: Self-pay | Admitting: Cardiovascular Disease

## 2022-07-31 DIAGNOSIS — R2681 Unsteadiness on feet: Secondary | ICD-10-CM | POA: Diagnosis not present

## 2022-07-31 DIAGNOSIS — R2689 Other abnormalities of gait and mobility: Secondary | ICD-10-CM | POA: Diagnosis not present

## 2022-07-31 DIAGNOSIS — R296 Repeated falls: Secondary | ICD-10-CM | POA: Diagnosis not present

## 2022-07-31 DIAGNOSIS — M6281 Muscle weakness (generalized): Secondary | ICD-10-CM | POA: Diagnosis not present

## 2022-08-08 ENCOUNTER — Encounter: Payer: Self-pay | Admitting: Internal Medicine

## 2022-08-08 ENCOUNTER — Ambulatory Visit (INDEPENDENT_AMBULATORY_CARE_PROVIDER_SITE_OTHER): Payer: PPO | Admitting: Internal Medicine

## 2022-08-08 VITALS — BP 110/58 | HR 53 | Ht 75.0 in | Wt 166.6 lb

## 2022-08-08 DIAGNOSIS — F430 Acute stress reaction: Secondary | ICD-10-CM

## 2022-08-08 DIAGNOSIS — M6281 Muscle weakness (generalized): Secondary | ICD-10-CM | POA: Diagnosis not present

## 2022-08-08 DIAGNOSIS — F411 Generalized anxiety disorder: Secondary | ICD-10-CM | POA: Diagnosis not present

## 2022-08-08 DIAGNOSIS — G309 Alzheimer's disease, unspecified: Secondary | ICD-10-CM | POA: Diagnosis not present

## 2022-08-08 DIAGNOSIS — F0283 Dementia in other diseases classified elsewhere, unspecified severity, with mood disturbance: Secondary | ICD-10-CM | POA: Diagnosis not present

## 2022-08-08 DIAGNOSIS — E440 Moderate protein-calorie malnutrition: Secondary | ICD-10-CM | POA: Diagnosis not present

## 2022-08-08 MED ORDER — MOMETASONE FUROATE 0.1 % EX CREA: TOPICAL_CREAM | CUTANEOUS | 1 refills | Status: DC

## 2022-08-08 MED ORDER — BUPROPION HCL ER (SR) 150 MG PO TB12: 150.0000 mg | ORAL_TABLET | Freq: Every morning | ORAL | 1 refills | Status: DC

## 2022-08-08 NOTE — Progress Notes (Unsigned)
Subjective:  Patient ID: Theodore [name], male    DOB: Apr 23, 1929  Age: 89 y.o. MRN: [id number]  CC: There were no encounter diagnoses.   HPI Theodore [name] presents for  Chief Complaint  Patient presents with   Medical Management of Chronic Issues    1 month follow up    1) Proximal leg weakness, painful knee, off balance: starting PT tomorrow  2) nightly use of alprazolam has helped him rest   3) husband and wife trading cold symptoms for the past week.  Wife currently taking prednisone and cough medicine.  Intermittent loose stools.  (Mucinex)  4) Depression:   reports being very sad  due to his temporary displacement during [facility] renovation which started last week and will not be done until June 10 .  He is distraught today  . Continues to worry obsessively about a table that might get damaged .  Has lost 7 lbs since his last visit.  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 Also visited a friend who is not doig well after multiple surgeries and is in nursing home BUT happy. .   5) anorexia, "I take no pleasure in eating"   diet reviewed:  flavored oatmeal plus PB   ,  lunch:  2 eggs and a protein drink   dinner .  Snacks on cookies and ice cream.     Outpatient Medications Prior to Visit  Medication Sig Dispense Refill   ALPRAZolam (XANAX) 0.25 MG tablet Take 1 tablet (0.25 mg total) by mouth 2 (two) times daily as needed for anxiety or sleep. (Patient taking differently: Take 0.25 mg by mouth 2 (two) times daily as needed for anxiety or sleep. Takes 1 time daily) 60 tablet 2   ascorbic acid (VITAMIN C) 500 MG tablet Take 500 mg by mouth daily.     Cholecalciferol (VITAMIN D3) 50 MCG (2000 UT) TABS Take 1 tablet by mouth daily.     cycloSPORINE (RESTASIS) 0.05 % ophthalmic emulsion Place 1 drop into both eyes 2 (two) times daily.     ezetimibe (ZETIA) 10 MG tablet TAKE 1 TABLET BY MOUTH EVERY DAY 60 tablet 0   fluticasone (FLONASE)  50 MCG/ACT nasal spray SPRAY 2 SPRAYS INTO EACH NOSTRIL EVERY DAY 48 mL 1   levothyroxine (SYNTHROID) 100 MCG tablet TAKE 1 TABLET BY MOUTH EVERY DAY BEFORE BREAKFAST 90 tablet 3   magnesium oxide (MAG-OX) 400 MG tablet Take 400 mg by mouth daily.     memantine (NAMENDA) 5 MG tablet Take 5 mg by mouth 2 (two) times daily.     OVER THE COUNTER MEDICATION Folate 1360 mcg 1 Daily     propranolol ER (INDERAL LA) 60 MG 24 hr capsule Take 60 mg by mouth daily.     rosuvastatin (CRESTOR) 10 MG tablet Take 1 tablet (10 mg total) by mouth every other day. 45 tablet 3   Saccharomyces boulardii (PROBIOTIC) 250 MG CAPS Take 1 capsule by mouth daily at 12 noon.     sertraline (ZOLOFT) 50 MG tablet Take 1 tablet (50 mg total) by mouth daily. 90 tablet 1   testosterone cypionate (DEPOTESTOSTERONE CYPIONATE) 200 MG/ML injection INJECT 0.5 ML (100 MG TOTAL) INTO THE MUSCLE EVERY 14 DAYS. 1 mL 2   vitamin B-12 (CYANOCOBALAMIN) 500 MCG tablet Take 1 tablet by mouth daily.     No facility-administered medications prior to visit.    Review of Systems;  Patient  denies headache, fevers, malaise, unintentional weight loss, skin rash, eye pain, sinus congestion and sinus pain, sore throat, dysphagia,  hemoptysis , cough, dyspnea, wheezing, chest pain, palpitations, orthopnea, edema, abdominal pain, nausea, melena, diarrhea, constipation, flank pain, dysuria, hematuria, urinary  Frequency, nocturia, numbness, tingling, seizures,  Focal weakness, Loss of consciousness,  Tremor, insomnia, depression, anxiety, and suicidal ideation.      Objective:  BP (!) 110/58   Pulse (!) 53   Ht 6\' 3"  (1.905 m)   Wt 166 lb 9.6 oz (75.6 kg)   SpO2 95%   BMI 20.82 kg/m   BP Readings from Last 3 Encounters:  08/08/22 (!) 110/58  07/12/22 (!) 110/54  06/21/22 (!) 100/50    Wt Readings from Last 3 Encounters:  08/08/22 166 lb 9.6 oz (75.6 kg)  07/12/22 172 lb 9.6 oz (78.3 kg)  06/21/22 171 lb (77.6 kg)    Physical  Exam  Lab Results  Component Value Date   HGBA1C 4.8 01/17/2021   HGBA1C 4.7 05/28/2018    Lab Results  Component Value Date   CREATININE 1.22 06/21/2022   CREATININE 1.25 05/03/2022   CREATININE 1.19 01/23/2022    Lab Results  Component Value Date   WBC 6.8 06/21/2022   HGB 13.7 06/21/2022   HCT 42.2 06/21/2022   PLT 217 06/21/2022   GLUCOSE 96 06/21/2022   CHOL 197 09/21/2020   TRIG 70.0 09/21/2020   HDL 62.00 09/21/2020   LDLCALC 121 (H) 09/21/2020   ALT 16 05/03/2022   AST 16 05/03/2022   NA 140 06/21/2022   K 4.8 06/21/2022   CL 105 06/21/2022   CREATININE 1.22 06/21/2022   BUN 35 (H) 06/21/2022   CO2 29 06/21/2022   TSH 1.37 05/03/2022   PSA 21.68 (H) 02/11/2021   INR 1.4 (H) 11/29/2021   HGBA1C 4.8 01/17/2021    No results found.  Assessment & Plan:  .There are no diagnoses linked to this encounter.   I provided 30 minutes of face-to-face time during this encounter reviewing patient's last visit with me, patient's  most recent visit with cardiology,  nephrology,  and neurology,  recent surgical and non surgical procedures, previous  labs and imaging studies, counseling on currently addressed issues,  and post visit ordering to diagnostics and therapeutics .   Follow-up: No follow-ups on file.   [name], MD

## 2022-08-08 NOTE — Assessment & Plan Note (Signed)
He continues to lose weight due to Decreased appetite secondary to severe anxiety , which was addressed today  He did not tolerate previous trial of remeron .

## 2022-08-08 NOTE — Patient Instructions (Addendum)
Continue sertraline at 50 mg daily  Adding back wellbutrin 150 mg daily in the morning   Ok to add morning dose of alprazolam if needed

## 2022-08-09 NOTE — Assessment & Plan Note (Signed)
Resulting in loss of balance and falls.  He has been walking daily and starts PT tomorrow

## 2022-08-09 NOTE — Assessment & Plan Note (Addendum)
Coninue Nemantine;  Resuming wellbutrin once daily for low energy

## 2022-08-09 NOTE — Assessment & Plan Note (Signed)
Counselling given.  Continue sertraline, increas alprazolam to twice daily prn

## 2022-08-11 ENCOUNTER — Ambulatory Visit
Admission: EM | Admit: 2022-08-11 | Discharge: 2022-08-11 | Disposition: A | Discharge status: Home / Self Care | Payer: PPO | Attending: Emergency Medicine | Admitting: Emergency Medicine

## 2022-08-11 DIAGNOSIS — S0990XA Unspecified injury of head, initial encounter: Secondary | ICD-10-CM

## 2022-08-11 DIAGNOSIS — S0101XA Laceration without foreign body of scalp, initial encounter: Secondary | ICD-10-CM

## 2022-08-11 NOTE — ED Provider Notes (Signed)
Renaldo Fiddler    CSN: [id number] Arrival date & time: 08/11/22  1456      History   Chief Complaint Chief Complaint  Patient presents with   Laceration    HPI DORAL WENHOLD is a 89 y.o. male.  Accompanied by a friend, patient presents with a laceration on the back of his head that occurred 1 hour PTA.  He was trying to get up and his foot slipped; he fell and hit his head.  No loss of consciousness.  Wound cleaned at home and bleeding controlled pressure.  He was evaluated by EMT at [facility] and told to come to Urgent Care or to ED.  Last tetanus 2023.  The history is provided by the patient, a friend and medical records.    Past Medical History:  Diagnosis Date   Actinic keratosis    Arthritis    knees   Asymptomatic Sinus bradycardia    a. 05/2016 Zio: Avg HR 61 (41-167).   Benign essential tremor    Cancer (HCC) 2002   melanoma right ear   Colon polyp    COPD (chronic obstructive pulmonary disease) (HCC)    pt said he believes it is a misdiagnosis    Coronary artery calcification seen on CT scan    a. 06/2016 CTA Chest: cor Ca2+; b. 05/2017 MV: EF >65%. No ischemia/infarct.   COVID-19 virus infection 04/06/2020   Depression    Dysplastic nevus 04/25/2022   neck - anterior, severe, shave removal 06/05/22   History of echocardiogram    a. 04/2016 Echo: EF 60-65%, mild conc LVH. Nl PASP.   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 05/2016 Zio: Avg HR 61 (41-167). 9 SVT runs (fastest 167 - 5 beats; longest 8 beats - 101 bpm). Rare PACs/PVCs.   PVC's (premature ventricular contractions)    Rhinitis    Sepsis due to cellulitis  (HCC) 11/28/2021   Severe sepsis (HCC) 11/28/2021   Wears dentures    partial upper and lower   Wears hearing aid in both ears     Patient Active Problem List   Diagnosis Date Noted   Wound of left leg, initial encounter 06/12/2022   Sialorrhea 02/02/2022   Protein-calorie malnutrition, moderate (HCC) 11/29/2021   Dementia in Alzheimer's disease with depression (HCC) 11/28/2021   Dyslipidemia 11/28/2021   GERD without esophagitis 11/28/2021   Proximal muscle weakness 08/27/2021   Speech disturbance 08/26/2021   Adverse drug reaction, initial encounter 05/04/2021   Lumbar radiculopathy 04/25/2021   Thoracic aortic atherosclerosis (HCC) 01/17/2021   Anxiety in acute stress reaction 12/07/2020   Elevated PSA, between 10 and less than 20 ng/ml 11/30/2020   Chronic kidney disease, stage 3b (HCC) 11/10/2020   Benign hypertensive kidney disease with chronic kidney disease 11/10/2020   Acquired trigger finger of left middle finger 08/07/2019   Carpal tunnel syndrome of right wrist 07/10/2019   Ulnar neuropathy 06/05/2019   Localized, secondary osteoarthritis of the shoulder region 01/29/2019   Osteoarthritis of knee 01/29/2019   Chronic obstructive pulmonary disease (  HCC) 09/24/2018   Cervical radiculopathy 08/14/2018   PAD (peripheral artery disease) (HCC)    Hypothyroidism 07/04/2014   Nonallergic vasomotor rhinitis 07/01/2014   Hypertrophy of prostate with urinary obstruction and other lower urinary tract symptoms (LUTS) 08/08/2013   Low serum testosterone level 04/28/2013   Actinic keratosis 05/10/2010   History of malignant melanoma of skin 05/10/2010    Past Surgical History:  Procedure Laterality Date   ADENOIDECTOMY     CATARACT EXTRACTION W/PHACO Left 10/22/2017   Procedure: CATARACT EXTRACTION PHACO AND INTRAOCULAR LENS PLACEMENT (IOC)  LEFT;  Surgeon: Nevada Crane, MD;  Location: [facility];  Service: Ophthalmology;  Laterality: Left;   CATARACT  EXTRACTION W/PHACO Right 11/20/2017   Procedure: CATARACT EXTRACTION PHACO AND INTRAOCULAR LENS PLACEMENT (IOC) RIGHT;  Surgeon: Nevada Crane, MD;  Location: [facility];  Service: Ophthalmology;  Laterality: Right;   ESOPHAGOGASTRODUODENOSCOPY (EGD) WITH PROPOFOL N/A 10/31/2016   Procedure: ESOPHAGOGASTRODUODENOSCOPY (EGD) WITH PROPOFOL;  Surgeon: Christena Deem, MD;  Location: [facility];  Service: Endoscopy;  Laterality: N/A;   I & D EXTREMITY Right 11/29/2021   Procedure: IRRIGATION AND DEBRIDEMENT EXTREMITY;  Surgeon: Henrene Dodge, MD;  Location: [facility] OR[facility];  Service: General;  Laterality: Right;   JOINT REPLACEMENT Right 2003   knee   knee meniscus repair Right    MELANOMA EXCISION Right    ear. Followed by Dr. Gwen Pounds   PATELLECTOMY Bilateral    TONSILLECTOMY         Home Medications    Prior to Admission medications   Medication Sig Start Date End Date Taking? Authorizing Provider  ALPRAZolam (XANAX) 0.25 MG tablet Take 1 tablet (0.25 mg total) by mouth 2 (two) times daily as needed for anxiety or sleep. Patient taking differently: Take 0.25 mg by mouth 2 (two) times daily as needed for anxiety or sleep. Takes 1 time daily 03/28/22   [name], MD  ascorbic acid (VITAMIN C) 500 MG tablet Take 500 mg by mouth daily.    [provider]  buPROPion (WELLBUTRIN SR) 150 MG 12 hr tablet Take 1 tablet (150 mg total) by mouth in the morning. 08/08/22   [name], MD  Cholecalciferol (VITAMIN D3) 50 MCG (2000 UT) TABS Take 1 tablet by mouth daily.    [provider]  cycloSPORINE (RESTASIS) 0.05 % ophthalmic emulsion Place 1 drop into both eyes 2 (two) times daily.    [provider]  ezetimibe (ZETIA) 10 MG tablet TAKE 1 TABLET BY MOUTH EVERY DAY 07/28/22   Antonieta Iba, MD  fluticasone (FLONASE) 50 MCG/ACT nasal spray SPRAY 2 SPRAYS INTO EACH NOSTRIL EVERY DAY 03/14/22   Erin Fulling, MD  levothyroxine (SYNTHROID) 100 MCG tablet  TAKE 1 TABLET BY MOUTH EVERY DAY BEFORE BREAKFAST 06/12/22   [name], MD  magnesium oxide (MAG-OX) 400 MG tablet Take 400 mg by mouth daily.    [provider]  memantine (NAMENDA) 5 MG tablet Take 5 mg by mouth 2 (two) times daily. 07/07/22   [provider]  mometasone (ELOCON) 0.1 % cream Place in external ear canal as needed for itching 08/08/22   [name], MD  OVER THE COUNTER MEDICATION Folate 1360 mcg 1 Daily    [provider]  propranolol ER (INDERAL LA) 60 MG 24 hr capsule Take 60 mg by mouth daily. 07/29/22   [provider]  rosuvastatin (CRESTOR) 10 MG tablet Take 1 tablet (10 mg total) by mouth every  other day. 06/12/22   [name], MD  Saccharomyces boulardii (PROBIOTIC) 250 MG CAPS Take 1 capsule by mouth daily at 12 noon.    [provider]  sertraline (ZOLOFT) 50 MG tablet Take 1 tablet (50 mg total) by mouth daily. 07/12/22   [name], MD  testosterone cypionate (DEPOTESTOSTERONE CYPIONATE) 200 MG/ML injection INJECT 0.5 ML (100 MG TOTAL) INTO THE MUSCLE EVERY 14 DAYS. 05/02/22   [name], MD  vitamin B-12 (CYANOCOBALAMIN) 500 MCG tablet Take 1 tablet by mouth daily.    [provider]    Family History Family History  Problem Relation Age of Onset   Hypertension Mother    Heart disease Mother        CHF   Heart disease Father    Cancer Sister        melanoma    Social History Social History   Tobacco Use   Smoking status: Never   Smokeless tobacco: Never  Vaping Use   Vaping Use: Never used  Substance Use Topics   Alcohol use: Yes    Comment: 1 glass of wine daily   Drug use: No     Allergies   Penicillins, Benzonatate, Lexapro [escitalopram], and Molnupiravir   Review of Systems Review of Systems  Constitutional:  Negative for chills and fever.  Skin:  Positive for wound. Negative for color change.  Neurological:  Negative for dizziness, syncope, weakness and numbness.      Physical Exam Triage Vital Signs ED Triage Vitals  Enc Vitals Group     BP --      Pulse Rate 08/11/22 1505 92     Resp 08/11/22 1505 18     Temp 08/11/22 1505 97.9 F (36.6 C)     Temp src --      SpO2 08/11/22 1505 98 %     Weight --      Height --      Head Circumference --      Peak Flow --      Pain Score 08/11/22 1507 0     Pain Loc --      Pain Edu? --      Excl. in GC? --    No data found.  Updated Vital Signs BP (!) 112/58   Pulse 92   Temp 97.9 F (36.6 C)   Resp 18   SpO2 98%   Visual Acuity Right Eye Distance:   Left Eye Distance:   Bilateral Distance:    Right Eye Near:   Left Eye Near:    Bilateral Near:     Physical Exam Vitals and nursing note reviewed.  Constitutional:      General: He is not in acute distress.    Appearance: He is well-developed.  HENT:     Mouth/Throat:     Mouth: Mucous membranes are moist.  Cardiovascular:     Rate and Rhythm: Normal rate and regular rhythm.  Pulmonary:     Effort: Pulmonary effort is normal. No respiratory distress.  Musculoskeletal:     Cervical back: Neck supple.  Skin:    General: Skin is warm and dry.     Findings: Lesion present.     Comments: 3 cm laceration on back of head.  Bleeding controlled.   Neurological:     Mental Status: He is alert.  Psychiatric:        Mood and Affect: Mood normal.        Behavior: Behavior  normal.      UC Treatments / Results  Labs (all labs ordered are listed, but only abnormal results are displayed) Labs Reviewed - No data to display  EKG   Radiology No results found.  Procedures Laceration Repair  Date/Time: 08/11/2022 3:41 PM  Performed by: Mickie Bail, NP Authorized by: Mickie Bail, NP   Consent:    Consent obtained:  Verbal   Consent given by:  Patient   Risks discussed:  Infection, pain, poor cosmetic result and poor wound healing Universal protocol:    Procedure explained and questions answered to patient or proxy's  satisfaction: yes   Anesthesia:    Anesthesia method:  Local infiltration   Local anesthetic:  Lidocaine 1% WITH epi Laceration details:    Location:  Scalp   Scalp location:  Occipital   Length (cm):  3   Depth (mm):  2 Pre-procedure details:    Preparation:  Patient was prepped and draped in usual sterile fashion Exploration:    Hemostasis achieved with:  Direct pressure   Imaging outcome: foreign body not noted     Wound exploration: entire depth of wound visualized   Treatment:    Area cleansed with:  Shur-Clens   Amount of cleaning:  Standard   Irrigation solution:  Sterile water   Irrigation method:  Syringe   Visualized foreign bodies/material removed: no   Skin repair:    Repair method:  Staples   Number of staples:  4 Approximation:    Approximation:  Close Repair type:    Repair type:  Simple Post-procedure details:    Dressing:  Antibiotic ointment   Procedure completion:  Tolerated well, no immediate complications  (including critical care time)  Medications Ordered in UC Medications - No data to display  Initial Impression / Assessment and Plan / UC Course  I have reviewed the triage vital signs and the nursing notes.  Pertinent labs & imaging results that were available during my care of the patient were reviewed by me and considered in my medical decision making (see chart for details).    Scalp laceration, head injury.  4 staples.  Tetanus up-to-date.  Patient declines transfer to the ED for his head injury.  He did not lose consciousness and is not on anticoagulants.  Vital signs are stable.  Wound care instructions discussed.  Head injury precautions discussed.  Instructed patient to return here for staple removal in 7 to 10 days.  Instructed him to return right away if he notes signs of infection.  Education provided on laceration care and head injury.  Patient agrees to plan of care.  Final Clinical Impressions(s) / UC Diagnoses   Final diagnoses:   Laceration of scalp, initial encounter  Injury of head, initial encounter     Discharge Instructions      Return here for removal of the staples in 7 to 10 days.  Return right away if you see signs of infection.  See the attached information on laceration care and head injury.     ED Prescriptions   None    PDMP not reviewed this encounter.   Mickie Bail, NP 08/11/22 [phone]

## 2022-08-11 NOTE — Discharge Instructions (Addendum)
Return here for removal of the staples in 7 to 10 days.  Return right away if you see signs of infection.  See the attached information on laceration care and head injury.

## 2022-08-11 NOTE — ED Triage Notes (Signed)
Patient to Urgent Care with complaints of a laceration present to the back of his head. Reports a ground level fall in his kitchen that occurred approx 1 hour ago. Lac was cleaned prior to arrival. Bleeding well controlled at this time.  No LOC.

## 2022-08-15 DIAGNOSIS — R296 Repeated falls: Secondary | ICD-10-CM | POA: Diagnosis not present

## 2022-08-15 DIAGNOSIS — M6281 Muscle weakness (generalized): Secondary | ICD-10-CM | POA: Diagnosis not present

## 2022-08-15 DIAGNOSIS — R2689 Other abnormalities of gait and mobility: Secondary | ICD-10-CM | POA: Diagnosis not present

## 2022-08-15 DIAGNOSIS — R2681 Unsteadiness on feet: Secondary | ICD-10-CM | POA: Diagnosis not present

## 2022-08-18 ENCOUNTER — Ambulatory Visit
Admission: RE | Admit: 2022-08-18 | Discharge: 2022-08-18 | Disposition: A | Discharge status: Home / Self Care | Payer: PPO | Source: Ambulatory Visit | Attending: Internal Medicine | Admitting: Internal Medicine

## 2022-08-18 DIAGNOSIS — Z4802 Encounter for removal of sutures: Secondary | ICD-10-CM | POA: Diagnosis not present

## 2022-08-18 NOTE — ED Triage Notes (Signed)
Pt presents to have staples removed from back of head. Pt came 08/11/22 and had 4 staples placed. Pt has no redness or signs of infection, removed 4 staples from back of head,  pt tolerated it well.

## 2022-08-23 ENCOUNTER — Encounter: Payer: Self-pay | Admitting: Nurse Practitioner

## 2022-08-23 ENCOUNTER — Ambulatory Visit: Payer: PPO | Attending: Nurse Practitioner | Admitting: Nurse Practitioner

## 2022-08-23 VITALS — BP 100/50 | HR 40 | Ht 75.0 in | Wt 165.4 lb

## 2022-08-23 DIAGNOSIS — R55 Syncope and collapse: Secondary | ICD-10-CM

## 2022-08-23 DIAGNOSIS — N1832 Chronic kidney disease, stage 3b: Secondary | ICD-10-CM

## 2022-08-23 DIAGNOSIS — I2510 Atherosclerotic heart disease of native coronary artery without angina pectoris: Secondary | ICD-10-CM

## 2022-08-23 DIAGNOSIS — I739 Peripheral vascular disease, unspecified: Secondary | ICD-10-CM | POA: Diagnosis not present

## 2022-08-23 DIAGNOSIS — J449 Chronic obstructive pulmonary disease, unspecified: Secondary | ICD-10-CM | POA: Diagnosis not present

## 2022-08-23 DIAGNOSIS — R001 Bradycardia, unspecified: Secondary | ICD-10-CM | POA: Diagnosis not present

## 2022-08-23 DIAGNOSIS — I471 Supraventricular tachycardia, unspecified: Secondary | ICD-10-CM

## 2022-08-23 DIAGNOSIS — E782 Mixed hyperlipidemia: Secondary | ICD-10-CM

## 2022-08-23 NOTE — Progress Notes (Signed)
Office Visit    Patient Name: Theodore [name] Date of Encounter: 08/23/2022  Primary Care Provider:  [name], MD Primary Cardiologist:  [name], MD  Chief Complaint    89 y/o ? w/ a h/o CAD (cor Ca2+), PAD, PVCs, Ao atherosclerosis, COPD, GERD, thyroidism, mild cognitive impairment, stage IIIb chronic kidney disease, hyperlipidemia, brief runs of PSVT, and protein calorie malnutrition, who presents for follow-up related to bradycardia.  Past Medical History    Past Medical History:  Diagnosis Date   Actinic keratosis    Arthritis    knees   Asymptomatic Sinus bradycardia    a. 05/2016 Zio: Avg HR 61 (41-167).   Benign essential tremor    Cancer (HCC) 2002   melanoma right ear   Colon polyp    COPD (chronic obstructive pulmonary disease) (HCC)    pt said he believes it is a misdiagnosis    Coronary artery calcification seen on CT scan    a. 06/2016 CTA Chest: cor Ca2+; b. 05/2017 MV: EF >65%. No ischemia/infarct.   COVID-19 virus infection 04/06/2020   Depression    Dysplastic nevus 04/25/2022   neck - anterior, severe, shave removal 06/05/22   History of echocardiogram    a. 04/2016 Echo: EF 60-65%, mild conc LVH. Nl PASP.   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 05/2016 Zio: Avg HR 61 (41-167). 9 SVT runs (fastest 167 - 5 beats; longest 8 beats - 101 bpm). Rare PACs/PVCs.   Pre-syncope    a. 06/2022 Zio: Predominantly sinus bradycardia with an average heart rate of 53 (40-148).  1 run of NSVT x 17 beats (143 bpm), 19 SVT runs (fastest 148 x 7 beats; longest 11 beats @ 114 bpm).  Rare PVCs/PACs. Triggered events assoc w/  sinus rhythym.   PSVT (paroxysmal supraventricular tachycardia)    a. 05/2016 Zio: 9 SVT runs; b. 06/2022 Zio: 19 SVT runs, fastest 148, longest 11 beats.   PVC's (premature ventricular contractions)    Rhinitis    Sepsis due to cellulitis (HCC) 11/28/2021   Severe sepsis (HCC) 11/28/2021   Wears dentures    partial upper and lower   Wears hearing aid in both ears    Past Surgical History:  Procedure Laterality Date   ADENOIDECTOMY     CATARACT EXTRACTION W/PHACO Left 10/22/2017   Procedure: CATARACT EXTRACTION PHACO AND INTRAOCULAR LENS PLACEMENT (IOC)  LEFT;  Surgeon: Nevada Crane, MD;  Location: [facility];  Service: Ophthalmology;  Laterality: Left;   CATARACT EXTRACTION W/PHACO Right 11/20/2017   Procedure: CATARACT EXTRACTION PHACO AND INTRAOCULAR LENS PLACEMENT (IOC) RIGHT;  Surgeon: Nevada Crane, MD;  Location: [facility];  Service: Ophthalmology;  Laterality: Right;   ESOPHAGOGASTRODUODENOSCOPY (EGD) WITH PROPOFOL N/A 10/31/2016   Procedure: ESOPHAGOGASTRODUODENOSCOPY (EGD) WITH PROPOFOL;  Surgeon: Christena Deem, MD;  Location: [facility];  Service: Endoscopy;  Laterality: N/A;   I & D EXTREMITY Right 11/29/2021   Procedure: IRRIGATION AND DEBRIDEMENT EXTREMITY;  Surgeon: Henrene Dodge, MD;  Location: [facility];  Service: General;  Laterality: Right;   JOINT REPLACEMENT Right 2003   knee   knee meniscus repair Right    MELANOMA EXCISION Right    ear. Followed by Dr. Gwen Pounds   PATELLECTOMY Bilateral    TONSILLECTOMY      Allergies  Allergies  Allergen Reactions   Penicillins Rash and Other (See Comments)    Other reaction(s): Other (see comments) Other reaction(s): UNKNOWN   Benzonatate     Pruritic rash   Lexapro [Escitalopram] Other (See Comments)    Increased hand tremor   Molnupiravir     Pruritic rash on neck     History of Present Illness      89 y/o ? w/ a h/o CAD (cor Ca2+), PAD, PVCs, Ao atherosclerosis, COPD, GERD,  thyroidism, mild cognitive impairment, stage IIIb chronic kidney disease, hyperlipidemia, brief runs of PSVT, and protein calorie malnutrition.  He establish care with cardiology in 2018 for hospitalization for dyspnea, weakness, and chest tightness, with findings of sinus bradycardia.  CT scan during hospitalization showed three-vessel coronary calcification.  Echo showed normal LV function.  Stress testing was undertaken and was low risk without ischemia.  Subsequent monitor showed PVCs and sinus rhythm with average heart rates in the low 60s.  In June 2022, he reported profound fatigue, dyspnea, and presyncope.  Repeat echo showed normal LV function with grade 1 diastolic dysfunction.  Monitoring in November 2022 showed an average heart rate of 59 bpm with 15 runs of SVT.  Theodore [name] was seen in April 2024 after experiencing near syncope during physical therapy, while using a recumbent bicycle becoming diaphoretic and lightheaded.  Subsequent event monitor showed predominantly sinus bradycardia 53 bpm with 117 beat run of nonsustained VT, and 19 brief runs of SVT.  Triggered events were associated with sinus rhythm.   Since his last visit, Theodore [name] has felt well.  He walks regularly for about 30 mins in the evening.  He has chronic, stable DOE during these walks, and though sometimes he can walk about 30 minutes without resting, other times, he may stop and rest several times.  He does not experience chest pain.  He has had no recurrence of presyncope and denies palpitations, PND, orthopnea, dizziness, edema, or early satiety.  She did require ED evaluation in May after slipping and cutting his head.  She did not have any significant trauma.  His heart rate today is 40 bpm with normal intervals.  He takes propranolol 60 mg daily, and took a dose this morning.  Home Medications    Current Outpatient Medications  Medication Sig Dispense Refill   ALPRAZolam (XANAX) 0.25 MG tablet Take 1 tablet (0.25 mg  total) by mouth 2 (two) times daily as needed for anxiety or sleep. (Patient taking differently: Take 0.25 mg by mouth 2 (two) times daily as needed for anxiety or sleep. Takes 1 time daily) 60 tablet 2   ascorbic acid (VITAMIN C) 500 MG tablet Take 500 mg by mouth daily.     buPROPion (WELLBUTRIN SR) 150 MG 12 hr tablet Take 1 tablet (150 mg total) by mouth in the morning. 90 tablet 1   Cholecalciferol (VITAMIN D3) 50 MCG (2000 UT) TABS Take 1 tablet by mouth daily.     cycloSPORINE (RESTASIS) 0.05 % ophthalmic emulsion Place 1 drop into both eyes 2 (two) times daily.     ezetimibe (ZETIA) 10 MG tablet TAKE 1 TABLET BY MOUTH EVERY DAY 60 tablet 0   fluticasone (FLONASE) 50 MCG/ACT  nasal spray SPRAY 2 SPRAYS INTO EACH NOSTRIL EVERY DAY 48 mL 1   levothyroxine (SYNTHROID) 100 MCG tablet TAKE 1 TABLET BY MOUTH EVERY DAY BEFORE BREAKFAST 90 tablet 3   magnesium oxide (MAG-OX) 400 MG tablet Take 400 mg by mouth daily.     memantine (NAMENDA) 5 MG tablet Take 5 mg by mouth 2 (two) times daily.     mometasone (ELOCON) 0.1 % cream Place in external ear canal as needed for itching 15 g 1   OVER THE COUNTER MEDICATION Folate 1360 mcg 1 Daily     propranolol ER (INDERAL LA) 60 MG 24 hr capsule Take 60 mg by mouth daily.     rosuvastatin (CRESTOR) 10 MG tablet Take 1 tablet (10 mg total) by mouth every other day. 45 tablet 3   Saccharomyces boulardii (PROBIOTIC) 250 MG CAPS Take 1 capsule by mouth daily at 12 noon.     sertraline (ZOLOFT) 50 MG tablet Take 1 tablet (50 mg total) by mouth daily. 90 tablet 1   testosterone cypionate (DEPOTESTOSTERONE CYPIONATE) 200 MG/ML injection INJECT 0.5 ML (100 MG TOTAL) INTO THE MUSCLE EVERY 14 DAYS. 1 mL 2   vitamin B-12 (CYANOCOBALAMIN) 500 MCG tablet Take 1 tablet by mouth daily.     No current facility-administered medications for this visit.     Review of Systems    Chronic dyspnea on exertion.  He denies chest pain, palpitations, PND, orthopnea, dizziness,  syncope, edema, or early satiety.  All other systems reviewed and are otherwise negative except as noted above.    Physical Exam    VS:  BP (!) 100/50 (BP Location: Left Arm, Patient Position: Sitting, Cuff Size: Normal)   Pulse (!) 40   Ht 6\' 3"  (1.905 m)   Wt 165 lb 6 oz (75 kg)   SpO2 94%   BMI 20.67 kg/m  , BMI Body mass index is 20.67 kg/m.     GEN: Well nourished, well developed, in no acute distress. HEENT: normal. Neck: Supple, no JVD, carotid bruits, or masses. Cardiac: RRR, bradycardic, no murmurs, rubs, or gallops. No clubbing, cyanosis, edema.  Radials 2+/PT 2+ and equal bilaterally.  Respiratory:  Respirations regular and unlabored, clear to auscultation bilaterally. GI: Soft, nontender, nondistended, BS + x 4. MS: no deformity or atrophy. Skin: warm and dry, no rash. Neuro:  Strength and sensation are intact. Psych: Normal affect.  Accessory Clinical Findings    ECG personally reviewed by me today -sinus bradycardia, 40- no acute changes.  Lab Results  Component Value Date   WBC 6.8 06/21/2022   HGB 13.7 06/21/2022   HCT 42.2 06/21/2022   MCV 96.3 06/21/2022   PLT 217 06/21/2022   Lab Results  Component Value Date   CREATININE 1.22 06/21/2022   BUN 35 (H) 06/21/2022   NA 140 06/21/2022   K 4.8 06/21/2022   CL 105 06/21/2022   CO2 29 06/21/2022   Lab Results  Component Value Date   ALT 16 05/03/2022   AST 16 05/03/2022   ALKPHOS 43 05/03/2022   BILITOT 1.0 05/03/2022   Lab Results  Component Value Date   CHOL 197 09/21/2020   HDL 62.00 09/21/2020   LDLCALC 121 (H) 09/21/2020   TRIG 70.0 09/21/2020   CHOLHDL 3 09/21/2020    Lab Results  Component Value Date   HGBA1C 4.8 01/17/2021    Assessment & Plan    1.  Sinus bradycardia: Patient presents today for follow-up after presyncopal event in April  2024, with subsequent event monitoring showing predominantly sinus bradycardia with average heart rate of 53 bpm, 19 runs of SVT, and 1 run of  nonsustained VT.  Triggered events were associated with sinus rhythm.  Heart rate today is 40 bpm.  He is asymptomatic.  Takes Inderal LA 60 mg daily.  His Apple Watch has been alarming over the past several days due to heart rates less than 40.  With ambulation, his heart rate did rise appropriately to 130.  In the setting of ongoing fairly profound sinus bradycardia on beta-blocker therapy, I am going to discontinue this.  I am concerned about progression of sinus node dysfunction.  He uses a pulse oximeter at home to trend his heart rate and as mentioned, has an Scientist, physiological.  He will be on the look out for more frequent palpitations in the absence of beta-blocker therapy, and if present, will likely need EP evaluation for tachybrady syndrome.  2.  Presyncope: As above, presyncopal episode during physical therapy at April (but event monitoring showed no significant arrhythmias.  He did have 1, 17 beat run of nonsustained VT, which was asymptomatic.  No further symptoms.  Echocardiogram pending.  3.  CAD/coronary calcifications: Previously noted on CT in February 2023.  Prior normal stress test 2019.  Has chronic dyspnea on exertion without angina.  He remains medically managed statin and Zetia.  4.  Hyperlipidemia: Has not had lipids drawn since July 2022, at which time his LDL was 121.  He is on rosuvastatin 10 and Zetia 10.  5.  Stage III chronic kidney disease: Creatinine normal at 1.22 in April with a BUN of 35.  He is followed by nephrology.  6.  COPD/hypoxia: Followed by pulmonology.  Chronic dyspnea on exertion, which has been stable.  He does not experience dyspnea at rest.  Upon ambulating him today throughout the office to assess his heart rate, we did note a drop in oxygen saturation to 86%, which quickly normalized with rest (94%).  He plans to follow-up with his primary care provider and pulmonologist, he would qualify for oxygen with ambulation.  7.  PSVT/nonsustained VT: See #1 & 2.   Asymptomatic.  Holding beta-blocker in the setting of bradycardia.  8.  Peripheral arterial disease: Followed by vascular surgery.  9.  Disposition: Follow-up echo as planned.  Follow-up in clinic in 1 month to reevaluate heart rates and symptoms.  Nicolasa Ducking, NP 08/23/2022, 11:35 AM

## 2022-08-23 NOTE — Patient Instructions (Signed)
Medication Instructions:  Your physician recommends the following medication changes.  STOP TAKING: Propranolol ER 60 mg daily.  *If you need a refill on your cardiac medications before your next appointment, please call your pharmacy*   Lab Work: No labs ordered today.   If you have labs (blood work) drawn today and your tests are completely normal, you will receive your results only by: MyChart Message (if you have MyChart) OR A paper copy in the mail If you have any lab test that is abnormal or we need to change your treatment, we will call you to review the results.   Testing/Procedures: No test ordered today.    Follow-Up: At [facility], you and your health needs are our priority.  As part of our continuing mission to provide you with exceptional heart care, we have created designated Provider Care Teams.  These Care Teams include your primary Cardiologist (physician) and Advanced Practice Providers (APPs -  Physician Assistants and Nurse Practitioners) who all work together to provide you with the care you need, when you need it.  We recommend signing up for the patient portal called "MyChart".  Sign up information is provided on this After Visit Summary.  MyChart is used to connect with patients for Virtual Visits (Telemedicine).  Patients are able to view lab/test results, encounter notes, upcoming appointments, etc.  Non-urgent messages can be sent to your provider as well.   To learn more about what you can do with MyChart, go to [email or web address].    Your next appointment:   1 month(s)  Provider:   You may see [name], MD or one of the following Advanced Practice Providers on your designated Care Team:   Nicolasa Ducking, NP Eula Listen, PA-C Cadence Fransico Michael, PA-C Charlsie Quest, NP

## 2022-08-24 ENCOUNTER — Encounter: Payer: Self-pay | Admitting: Cardiovascular Disease

## 2022-09-05 ENCOUNTER — Ambulatory Visit: Payer: PPO | Attending: Nurse Practitioner

## 2022-09-05 DIAGNOSIS — I493 Ventricular premature depolarization: Secondary | ICD-10-CM

## 2022-09-05 LAB — ECHOCARDIOGRAM COMPLETE
AR max vel: 3.23 cm2
AV Area VTI: 3.29 cm2
AV Area mean vel: 3.1 cm2
AV Mean grad: 2 mmHg
AV Peak grad: 4.5 mmHg
Ao pk vel: 1.06 m/s
Area-P 1/2: 2.62 cm2
Calc EF: 56.4 %
Single Plane A2C EF: 59.1 %
Single Plane A4C EF: 53 %

## 2022-09-06 ENCOUNTER — Encounter: Payer: Self-pay | Admitting: Internal Medicine

## 2022-09-06 ENCOUNTER — Ambulatory Visit (INDEPENDENT_AMBULATORY_CARE_PROVIDER_SITE_OTHER): Payer: PPO | Admitting: Internal Medicine

## 2022-09-06 VITALS — BP 112/56 | HR 69 | Ht 75.0 in | Wt 162.6 lb

## 2022-09-06 DIAGNOSIS — I69991 Dysphagia following unspecified cerebrovascular disease: Secondary | ICD-10-CM

## 2022-09-06 DIAGNOSIS — E440 Moderate protein-calorie malnutrition: Secondary | ICD-10-CM

## 2022-09-06 DIAGNOSIS — F411 Generalized anxiety disorder: Secondary | ICD-10-CM

## 2022-09-06 DIAGNOSIS — G309 Alzheimer's disease, unspecified: Secondary | ICD-10-CM | POA: Diagnosis not present

## 2022-09-06 DIAGNOSIS — F430 Acute stress reaction: Secondary | ICD-10-CM | POA: Diagnosis not present

## 2022-09-06 DIAGNOSIS — F0283 Dementia in other diseases classified elsewhere, unspecified severity, with mood disturbance: Secondary | ICD-10-CM | POA: Diagnosis not present

## 2022-09-06 MED ORDER — SERTRALINE HCL 50 MG PO TABS: 75.0000 mg | ORAL_TABLET | Freq: Every day | ORAL | 1 refills | Status: DC

## 2022-09-06 NOTE — Patient Instructions (Addendum)
  I recommend that you increase the sertraline to 1.5 tablets for your anxiety  Ok to continue alprazolaam at bedtime    I have made the referral to [facility] Therapy    Call Dr Stephenie Acres at [facility] for the trigger finger

## 2022-09-06 NOTE — Progress Notes (Unsigned)
Subjective:  Patient ID: Theodore [name], male    DOB: [birth year]  Age: 89 y.o. MRN: [id number]  CC: There were no encounter diagnoses.   HPI Theodore [name] presents for  Chief Complaint  Patient presents with   Medical Management of Chronic Issues    1 month follow up     1)  finally back in his home.  Anxiety improved.  Nightmares have stopped. Using alprazolam,  sleeping longer  in the mornings    2)  Dysphagia: has been choking with  some meals .  Has a bad episode every 3 days where he cannot breathe .  Wants to see speech therapist at [facility].    3) trigger fingers  middle and ringer finger  of left  hand .  Fingers are flexed in the morning , has to manually open them.  Has had injections in the past by [place]  4) weight loss: /  reviewed breakfast.  Oatmeal and banana /peanut butter  with cornbead .    Lunch is a [employer] shake, an egg , cornbread and leftovers from dinner   Dinner : chicken breast strips, broccoli, apple sauce .   Snack reese's peanut butter ice cream and cookie   5) Frustrated at inability to improve leg strength.  Using a rolling walker.  Not ready for a cane per PT>   Outpatient Medications Prior to Visit  Medication Sig Dispense Refill   ALPRAZolam (XANAX) 0.25 MG tablet Take 1 tablet (0.25 mg total) by mouth 2 (two) times daily as needed for anxiety or sleep. (Patient taking differently: Take 0.25 mg by mouth 2 (two) times daily as needed for anxiety or sleep. Takes 1 time daily) 60 tablet 2   ascorbic acid (VITAMIN C) 500 MG tablet Take 500 mg by mouth daily.     buPROPion (WELLBUTRIN SR) 150 MG 12 hr tablet Take 1 tablet (150 mg total) by mouth in the morning. 90 tablet 1   Cholecalciferol (VITAMIN D3) 50 MCG (2000 UT) TABS Take 1 tablet by mouth daily.     cycloSPORINE (RESTASIS) 0.05 % ophthalmic emulsion Place 1 drop into both eyes 2 (two) times daily.     ezetimibe (ZETIA) 10 MG tablet TAKE 1 TABLET BY MOUTH EVERY DAY 60 tablet 0   fluticasone  (FLONASE) 50 MCG/ACT nasal spray SPRAY 2 SPRAYS INTO EACH NOSTRIL EVERY DAY 48 mL 1   Iodine Strong, Lug[place], (IODINE STRONG PO) Take 225 mg by mouth daily.     Iodine, Kelp, (KELP PO) Take 56 mg by mouth daily.     levothyroxine (SYNTHROID) 100 MCG tablet TAKE 1 TABLET BY MOUTH EVERY DAY BEFORE BREAKFAST 90 tablet 3   memantine (NAMENDA) 5 MG tablet Take 5 mg by mouth 2 (two) times daily.     mometasone (ELOCON) 0.1 % cream Place in external ear canal as needed for itching 15 g 1   OVER THE COUNTER MEDICATION Folate 1360 mcg 1 Daily     rosuvastatin (CRESTOR) 10 MG tablet Take 1 tablet (10 mg total) by mouth every other day. 45 tablet 3   Saccharomyces boulardii (PROBIOTIC) 250 MG CAPS Take 1 capsule by mouth daily at 12 noon.     sertraline (ZOLOFT) 50 MG tablet Take 1 tablet (50 mg total) by mouth daily. 90 tablet 1   testosterone cypionate (DEPOTESTOSTERONE CYPIONATE) 200 MG/ML injection INJECT 0.5 ML (100 MG TOTAL) INTO THE MUSCLE EVERY 14 DAYS. 1 mL 2   vitamin B-12 (  CYANOCOBALAMIN) 500 MCG tablet Take 1 tablet by mouth daily.     ZINC GLUCONATE PO Take 80 mg by mouth daily.     magnesium oxide (MAG-OX) 400 MG tablet Take 400 mg by mouth daily.     No facility-administered medications prior to visit.    Review of Systems;  Patient denies headache, fevers, malaise, unintentional weight loss, skin rash, eye pain, sinus congestion and sinus pain, sore throat, dysphagia,  hemoptysis , cough, dyspnea, wheezing, chest pain, palpitations, orthopnea, edema, abdominal pain, nausea, melena, diarrhea, constipation, flank pain, dysuria, hematuria, urinary  Frequency, nocturia, numbness, tingling, seizures,  Focal weakness, Loss of consciousness,  Tremor, insomnia, depression, anxiety, and suicidal ideation.      Objective:  BP (!) 112/56   Pulse 69   Ht 6\' 3"  (1.905 m)   Wt 162 lb 9.6 oz (73.8 kg)   SpO2 93%   BMI 20.32 kg/m   BP Readings from Last 3 Encounters:  09/06/22 (!) 112/56   08/23/22 (!) 100/50  08/11/22 (!) 112/58    Wt Readings from Last 3 Encounters:  09/06/22 162 lb 9.6 oz (73.8 kg)  08/23/22 165 lb 6 oz (75 kg)  08/08/22 166 lb 9.6 oz (75.6 kg)    Physical Exam  Lab Results  Component Value Date   HGBA1C 4.8 01/17/2021   HGBA1C 4.7 05/28/2018    Lab Results  Component Value Date   CREATININE 1.22 06/21/2022   CREATININE 1.25 05/03/2022   CREATININE 1.19 01/23/2022    Lab Results  Component Value Date   WBC 6.8 06/21/2022   HGB 13.7 06/21/2022   HCT 42.2 06/21/2022   PLT 217 06/21/2022   GLUCOSE 96 06/21/2022   CHOL 197 09/21/2020   TRIG 70.0 09/21/2020   HDL 62.00 09/21/2020   LDLCALC 121 (H) 09/21/2020   ALT 16 05/03/2022   AST 16 05/03/2022   NA 140 06/21/2022   K 4.8 06/21/2022   CL 105 06/21/2022   CREATININE 1.22 06/21/2022   BUN 35 (H) 06/21/2022   CO2 29 06/21/2022   TSH 1.37 05/03/2022   PSA 21.68 (H) 02/11/2021   INR 1.4 (H) 11/29/2021   HGBA1C 4.8 01/17/2021    No results found.  Assessment & Plan:  .There are no diagnoses linked to this encounter.   I provided 30 minutes of face-to-face time during this encounter reviewing patient's last visit with me, patient's  most recent visit with cardiology,  nephrology,  and neurology,  recent surgical and non surgical procedures, previous  labs and imaging studies, counseling on currently addressed issues,  and post visit ordering to diagnostics and therapeutics .   Follow-up: No follow-ups on file.   [name], MD

## 2022-09-07 DIAGNOSIS — R1310 Dysphagia, unspecified: Secondary | ICD-10-CM | POA: Insufficient documentation

## 2022-09-07 DIAGNOSIS — I69991 Dysphagia following unspecified cerebrovascular disease: Secondary | ICD-10-CM | POA: Insufficient documentation

## 2022-09-07 NOTE — Assessment & Plan Note (Signed)
Weight loss continues despite eating 3 meals daily.  Encouraged to add Ensure or Boost daily

## 2022-09-07 NOTE — Assessment & Plan Note (Signed)
Coninue Nemantine and wellbutrin once daily for low energy

## 2022-09-07 NOTE — Telephone Encounter (Signed)
Will you make sure the speech therapy referral goes to [facility]

## 2022-09-07 NOTE — Assessment & Plan Note (Signed)
Marginally improved since returning to his home .  He continues to require use of alprazolam twice daily to manage his anxiety along with sertraline

## 2022-09-07 NOTE — Assessment & Plan Note (Signed)
Referring to Speech Therapy at [facility]

## 2022-09-12 DIAGNOSIS — R296 Repeated falls: Secondary | ICD-10-CM | POA: Diagnosis not present

## 2022-09-12 DIAGNOSIS — R1312 Dysphagia, oropharyngeal phase: Secondary | ICD-10-CM | POA: Diagnosis not present

## 2022-09-12 DIAGNOSIS — R2681 Unsteadiness on feet: Secondary | ICD-10-CM | POA: Diagnosis not present

## 2022-09-12 DIAGNOSIS — R2689 Other abnormalities of gait and mobility: Secondary | ICD-10-CM | POA: Diagnosis not present

## 2022-09-12 DIAGNOSIS — M6281 Muscle weakness (generalized): Secondary | ICD-10-CM | POA: Diagnosis not present

## 2022-09-12 DIAGNOSIS — F800 Phonological disorder: Secondary | ICD-10-CM | POA: Diagnosis not present

## 2022-09-18 ENCOUNTER — Emergency Department: Payer: PPO

## 2022-09-18 ENCOUNTER — Other Ambulatory Visit: Payer: Self-pay

## 2022-09-18 ENCOUNTER — Emergency Department
Admission: EM | Admit: 2022-09-18 | Discharge: 2022-09-18 | Disposition: A | Discharge status: Home / Self Care | Payer: PPO | Source: Home / Self Care | Attending: Emergency Medicine | Admitting: Emergency Medicine

## 2022-09-18 ENCOUNTER — Encounter: Payer: Self-pay | Admitting: Emergency Medicine

## 2022-09-18 DIAGNOSIS — S0101XA Laceration without foreign body of scalp, initial encounter: Secondary | ICD-10-CM | POA: Diagnosis not present

## 2022-09-18 DIAGNOSIS — S199XXA Unspecified injury of neck, initial encounter: Secondary | ICD-10-CM | POA: Diagnosis not present

## 2022-09-18 DIAGNOSIS — S0191XA Laceration without foreign body of unspecified part of head, initial encounter: Secondary | ICD-10-CM | POA: Diagnosis not present

## 2022-09-18 DIAGNOSIS — W01198A Fall on same level from slipping, tripping and stumbling with subsequent striking against other object, initial encounter: Secondary | ICD-10-CM | POA: Diagnosis not present

## 2022-09-18 DIAGNOSIS — S0990XA Unspecified injury of head, initial encounter: Secondary | ICD-10-CM | POA: Diagnosis not present

## 2022-09-18 MED ORDER — BACITRACIN ZINC 500 UNIT/GM EX OINT: TOPICAL_OINTMENT | Freq: Once | CUTANEOUS | Status: AC

## 2022-09-18 NOTE — ED Provider Notes (Signed)
[facility] Provider Note    Event Date/Time   First MD Initiated Contact with Patient 09/18/22 0421     (approximate)   History   Fall and Head Injury   HPI  Theodore [name] is a 89 y.o. male who presents to the ED for evaluation of Fall and Head Injury   Reviewed PCP visit from 6/26.  Stroke, dementia, malnutrition, dysphagia  Patient presents to the ED for evaluation of a mechanical fall and a scalp laceration.  Reports that his hand mystic grabbed a rail, he slipped and fell backwards and struck his head on the ground.  No syncope and no other trauma beyond the back of his head.  Presents with his son-in-law  Physical Exam   Triage Vital Signs: ED Triage Vitals [09/18/22 0329]  Enc Vitals Group     BP 134/78     Pulse Rate 100     Resp 18     Temp 98.4 F (36.9 C)     Temp Source Oral     SpO2 98 %     Weight 160 lb (72.6 kg)     Height 6\' 3"  (1.905 m)     Head Circumference      Peak Flow      Pain Score 0     Pain Loc      Pain Edu?      Excl. in GC?     Most recent vital signs: Vitals:   09/18/22 0329 09/18/22 0444  BP: 134/78 127/77  Pulse: 100 82  Resp: 18 16  Temp: 98.4 F (36.9 C) 98 F (36.7 C)  SpO2: 98% 100%    General: Awake, no distress.  CV:  Good peripheral perfusion.  Resp:  Normal effort.  Abd:  No distention.  MSK:  No deformity noted.  Neuro:  No focal deficits appreciated. Other:  Occipital laceration about 5 cm in length into the subcutaneous tissue of the scalp.  No bony step-offs, hemostatic with direct pressure.   ED Results / Procedures / Treatments   Labs (all labs ordered are listed, but only abnormal results are displayed) Labs Reviewed - No data to display  EKG   RADIOLOGY CT head interpreted by me without evidence of acute intracranial pathology CT cervical spine interpreted by me without evidence of fracture or dislocation  Official radiology report(s): CT HEAD WO CONTRAST  ( )  Result Date: 09/18/2022 CLINICAL DATA:  Neck trauma.  Fall with head laceration EXAM: CT HEAD WITHOUT CONTRAST CT CERVICAL SPINE WITHOUT CONTRAST TECHNIQUE: Multidetector CT imaging of the head and cervical spine was performed following the standard protocol without intravenous contrast. Multiplanar CT image reconstructions of the cervical spine were also generated. RADIATION DOSE REDUCTION: This exam was performed according to the departmental dose-optimization program which includes automated exposure control, adjustment of the mA and/or kV according to patient size and/or use of iterative reconstruction technique. COMPARISON:  None Available. FINDINGS: CT HEAD FINDINGS Brain: No evidence of acute infarction, hemorrhage, hydrocephalus, extra-axial collection or mass lesion/mass effect. Generalized cerebral volume loss. Vascular: No hyperdense vessel or unexpected calcification. Skull: Normal. Negative for fracture or focal lesion. Sinuses/Orbits: Opacified left frontal sinus. CT CERVICAL SPINE FINDINGS Alignment: Normal. Skull base and vertebrae: No acute fracture. No primary bone lesion or focal pathologic process. Soft tissues and spinal canal: No prevertebral fluid or swelling. No visible canal hematoma. Disc levels:  Generalized degenerative endplate and facet spurring. Upper chest: Clear apical lungs IMPRESSION: No evidence  of acute intracranial or cervical spine injury. Electronically Signed   By: Tiburcio Pea M.D.   On: 09/18/2022 04:18   CT Cervical Spine Wo Contrast  Result Date: 09/18/2022 CLINICAL DATA:  Neck trauma.  Fall with head laceration EXAM: CT HEAD WITHOUT CONTRAST CT CERVICAL SPINE WITHOUT CONTRAST TECHNIQUE: Multidetector CT imaging of the head and cervical spine was performed following the standard protocol without intravenous contrast. Multiplanar CT image reconstructions of the cervical spine were also generated. RADIATION DOSE REDUCTION: This exam was performed according to  the departmental dose-optimization program which includes automated exposure control, adjustment of the mA and/or kV according to patient size and/or use of iterative reconstruction technique. COMPARISON:  None Available. FINDINGS: CT HEAD FINDINGS Brain: No evidence of acute infarction, hemorrhage, hydrocephalus, extra-axial collection or mass lesion/mass effect. Generalized cerebral volume loss. Vascular: No hyperdense vessel or unexpected calcification. Skull: Normal. Negative for fracture or focal lesion. Sinuses/Orbits: Opacified left frontal sinus. CT CERVICAL SPINE FINDINGS Alignment: Normal. Skull base and vertebrae: No acute fracture. No primary bone lesion or focal pathologic process. Soft tissues and spinal canal: No prevertebral fluid or swelling. No visible canal hematoma. Disc levels:  Generalized degenerative endplate and facet spurring. Upper chest: Clear apical lungs IMPRESSION: No evidence of acute intracranial or cervical spine injury. Electronically Signed   By: Tiburcio Pea M.D.   On: 09/18/2022 04:18    PROCEDURES and INTERVENTIONS:  .Marland KitchenLaceration Repair  Date/Time: 09/18/2022 6:20 AM  Performed by: Delton Prairie, MD Authorized by: Delton Prairie, MD   Consent:    Consent obtained:  Verbal   Consent given by:  Patient   Risks, benefits, and alternatives were discussed: yes   Anesthesia:    Anesthesia method:  None Laceration details:    Location:  Scalp   Scalp location:  Occipital   Length (cm):  5 Exploration:    Imaging outcome: foreign body not noted     Contaminated: no   Treatment:    Area cleansed with:  Povidone-iodine   Amount of cleaning:  Standard   Irrigation solution:  Sterile saline Skin repair:    Repair method:  Staples   Number of staples:  5 Approximation:    Approximation:  Close Repair type:    Repair type:  Simple Post-procedure details:    Dressing:  Antibiotic ointment   Procedure completion:  Tolerated well, no immediate  complications   Medications  bacitracin ointment ( Topical Given 09/18/22 0440)     IMPRESSION / MDM / ASSESSMENT AND PLAN / ED COURSE  I reviewed the triage vital signs and the nursing notes.  Differential diagnosis includes, but is not limited to, ICH, skull fracture, laceration, syncope, stroke  {Patient presents with symptoms of an acute illness or injury that is potentially life-threatening.  Patient presents after mechanical fall with a scalp laceration soon for bedside repair with staples and subsequent outpatient management.  Looks well, neurologically intact without signs of deficits or further trauma beyond his scalp.  He is at baseline and imaging is reassuring.  Cleaned and repaired with 5 staples.  Suitable for outpatient management.  Discussed wound care and staple removal  Clinical Course as of 09/18/22 4332  [facility] Sep 18, 2022  0433 5 staples [DS]    Clinical Course User Index [DS] Delton Prairie, MD     FINAL CLINICAL IMPRESSION(S) / ED DIAGNOSES   Final diagnoses:  Injury of head, initial encounter  Laceration of scalp, initial encounter     Rx / DC  Orders   ED Discharge Orders     None        Note:  This document was prepared using Dragon voice recognition software and may include unintentional dictation errors.   Delton Prairie, MD 09/18/22 [phone]

## 2022-09-18 NOTE — Discharge Instructions (Addendum)
5 staples, will need to be removed after 7-10 days.  This can be done at any urgent care, PCP office or ER  Gently wash the wound with soap and water.  It is okay to shower, but do not submerge in a bath or go swimming as it is healing.  Do not vigorously scrub.   Gently pat dry.   Once dry, then apply Neosporin or bacitracin or even Vaseline ointment to the area to act as a barrier to help prevent infection.

## 2022-09-18 NOTE — ED Triage Notes (Signed)
Pt presents ambulatory to triage via POV from [facility] with complaints of a head injury following a fall tonight. Pt states that he tripped and fell backwards and has a laceration to the left posterior surface of his head ~2inch. Denies LOC, not on thinners, no dizziness, N/V. A&Ox4 at this time.

## 2022-09-18 NOTE — ED Notes (Signed)
Applied bacitracin and band-aid to head wound

## 2022-09-20 ENCOUNTER — Telehealth: Payer: Self-pay | Admitting: Internal Medicine

## 2022-09-20 NOTE — Telephone Encounter (Signed)
Theodore [name] from [facility] called stating she need approval of some medication that the pt is taking because it is not listed on the FL2 form. Theodore [name] states the medication is purelax, ducolax, biotrue hydration boost for the eye, vitamin D3 , folate , zinc gluconate 30mg , folate acid 800 mcg and B12 . Camen wants to know if the pt is still taking flonase, iodine strong, elocon and depotestosterone. Theodore [name] stated the provider can fax her the information-[phone]

## 2022-09-20 NOTE — Telephone Encounter (Signed)
Form faxed back to [facility] with higher level of care selected

## 2022-09-20 NOTE — Telephone Encounter (Signed)
Printed med list and faxed to [facility].. [phone]

## 2022-09-20 NOTE — Telephone Encounter (Signed)
Elijah from [facility] called stating the pt is being moved to a higher level of care and need a Fl2 form signed. It was faxed on yesterday

## 2022-09-24 NOTE — Progress Notes (Signed)
Cardiology Office Note  Date:  09/25/2022   ID:  Theodore [name], DOB 1930-02-07, MRN [id number]  PCP:  [name], MD   Chief Complaint  Patient presents with   1 month follow up     Patient took a fall 1 week ago and will have staples removed from head today. Patient c/o shortness of breath with exertion. Medications reviewed by the patient verbally.     HPI:  89 yo gentleman who lives at [facility] with notes indicating a history of asymptomatic bradycardia,  prior history of depression,  who presented to the hospital with episodes of shortness of breath, lightheadedness, fatigue 05/10/2016 COPD , dx by pulmonary, dr. Belia Heman Covid 03/2020, scarring within the left lung base. Coronary calcification on CT scan February 2023 Who presents for follow-up of his sinus bradycardia and shortness of breath  Last seen by myself December 2022 Last seen by one of our providers June 2024 In follow-up today, he presents with his wife and son He reports having a fall last week, got out of bed overnight with full bladder, going to the bathroom, feels that he slipped on something hit the back of his head Seen in the ER, several staples placed  At baseline reports he is relatively active 30 min walk daily with walker, winded at end Rate into 80s with exertion, follows with a pulse oximeter  Denies orthostasis symptoms Tries to stay hydrated, lots of water Choking problem with food  Weight 156, continues to slowly trend downward  Echocardiogram September 05, 2022, EF 55%, no significant valvular heart disease  presyncopal event in April 2024, with subsequent event monitoring showing predominantly sinus bradycardia with average heart rate of 53 bpm, 19 runs of SVT, and 1 run of nonsustained VT.   Beta-blocker held for heart rate of 40 in June 2024  Prior history of fatigue, shortness of breath, presyncope after ambulating stairs Episodes lasting several hours  Has an allergy to  amoxicillin Developed and episode of spinning, weakness, SOB, significant rash Penicillin on his allergy list  EKG personally reviewed by myself on todays visit EKG Interpretation Date/Time:  Monday September 25 2022 11:53:58 EDT Ventricular Rate:  49 PR Interval:  166 QRS Duration:  70 QT Interval:  472 QTC Calculation: 426 R Axis:   28  Text Interpretation: Sinus bradycardia with Premature atrial complexes When compared with ECG of 03-Jan-2021 13:22, Premature atrial complexes are now Present Confirmed by [name] [phone]) on 09/25/2022 12:21:50 PM   Other past medical history reviewed Zio monitor, reviewed on today's visit Patch Wear Time:  13 days and 9 hours  Normal sinus rhythm,  min HR of 42 bpm, max HR of 160 bpm, and avg HR of 59 bpm. ----15 Supraventricular Tachycardia runs occurred, the run with the fastest interval lasting 10 beats with a max rate of 160 bpm, the longest lasting 9 beats with an avg rate of 93 bpm. ---- Isolated SVEs were rare (<1.0%), SVE Couplets were rare (<1.0%), and SVE Triplets were rare (<1.0%). Isolated VEs were rare (<1.0%), VE Couplets were rare (<1.0%), and no VE Triplets were present.  Ventricular Bigeminy and Trigeminy were present.  ----Patient triggered events associated with normal sinus rhythm, no significant arrhythmia  Echo February 10, 2021 reviewed today  1. Left ventricular ejection fraction, by estimation, is 65 to 70%. The  left ventricle has normal function. The left ventricle has no regional  wall motion abnormalities. There is mild left ventricular hypertrophy.  Left ventricular  diastolic parameters  are consistent with Grade I diastolic dysfunction (impaired relaxation).   2. Right ventricular systolic function is normal. The right ventricular  size is not well visualized.   3. The mitral valve is grossly normal. No evidence of mitral valve  regurgitation.   4. The aortic valve was not well visualized. Aortic valve regurgitation   is not visualized.   5. The inferior vena cava is normal in size with greater than 50%  respiratory variability, suggesting right atrial pressure of 3 mmHg.   Previously declined cholesterol medication Denies any chest pain concerning for angina, no claudication symptoms  CT scan performed previously with aortic atherosclerosis ,coronary calcification ABI studies :moderate bilateral disease  Other past medical history reviewed Cardiopulmonary stress testing was ordered 2019  gas exchange demonstrates excellent functional capacity no evidence of cardiopulmonary limitations.  At peak exercise, patient approached ventilatory limits and is likely hyperventilating and shown with elevated VE/VCO2 slope.  EKG personally reviewed by myself on todays visit Shows sinus bradycardia rate 53 bpm no significant ST-T wave changes  Other past medical history reviewed Prior history of depression October 2017,  Weight loss with anorexia  CT scan done in the hospital showing no significant PE contribute to shortness of breath  shows three-vessel coronary calcifications at least moderate in severity, aortic arch calcification mild to moderate   PMH:   has a past medical history of Actinic keratosis, Arthritis, Asymptomatic Sinus bradycardia, Benign essential tremor, Cancer (HCC) (2002), Colon polyp, COPD (chronic obstructive pulmonary disease) (HCC), Coronary artery calcification seen on CT scan, COVID-19 virus infection (04/06/2020), Depression, Dysplastic nevus (04/25/2022), History of echocardiogram, History of kidney stones, dysplastic nevus (05/07/2017), dysplastic nevus (09/06/2020), Hypothyroidism, Hypothyroidism, Laceration of right lower extremity, Melanoma (HCC) (1990's per pt), Melanoma in situ (HCC) (01/12/2014), MSSA infection, non-invasive, Palpitations, Pre-syncope, PSVT (paroxysmal supraventricular tachycardia), PVC's (premature ventricular contractions), Rhinitis, Sepsis due to cellulitis  (HCC) (11/28/2021), Severe sepsis (HCC) (11/28/2021), Wears dentures, and Wears hearing aid in both ears.  PSH:    Past Surgical History:  Procedure Laterality Date   ADENOIDECTOMY     CATARACT EXTRACTION W/PHACO Left 10/22/2017   Procedure: CATARACT EXTRACTION PHACO AND INTRAOCULAR LENS PLACEMENT (IOC)  LEFT;  Surgeon: Nevada Crane, MD;  Location: [facility];  Service: Ophthalmology;  Laterality: Left;   CATARACT EXTRACTION W/PHACO Right 11/20/2017   Procedure: CATARACT EXTRACTION PHACO AND INTRAOCULAR LENS PLACEMENT (IOC) RIGHT;  Surgeon: Nevada Crane, MD;  Location: [facility];  Service: Ophthalmology;  Laterality: Right;   ESOPHAGOGASTRODUODENOSCOPY (EGD) WITH PROPOFOL N/A 10/31/2016   Procedure: ESOPHAGOGASTRODUODENOSCOPY (EGD) WITH PROPOFOL;  Surgeon: Christena Deem, MD;  Location: [facility];  Service: Endoscopy;  Laterality: N/A;   I & D EXTREMITY Right 11/29/2021   Procedure: IRRIGATION AND DEBRIDEMENT EXTREMITY;  Surgeon: Henrene Dodge, MD;  Location: [facility];  Service: General;  Laterality: Right;   JOINT REPLACEMENT Right 2003   knee   knee meniscus repair Right    MELANOMA EXCISION Right    ear. Followed by Dr. Gwen Pounds   PATELLECTOMY Bilateral    TONSILLECTOMY      Current Outpatient Medications  Medication Sig Dispense Refill   ALPRAZolam (XANAX) 0.25 MG tablet Take 1 tablet (0.25 mg total) by mouth 2 (two) times daily as needed for anxiety or sleep. 60 tablet 2   ascorbic acid (VITAMIN C) 500 MG tablet Take 500 mg by mouth daily.     buPROPion (WELLBUTRIN SR) 150 MG 12 hr tablet Take 1 tablet (  150 mg total) by mouth in the morning. 90 tablet 1   Cholecalciferol (VITAMIN D3) 50 MCG (2000 UT) TABS Take 1 tablet by mouth daily.     cycloSPORINE (RESTASIS) 0.05 % ophthalmic emulsion Place 1 drop into both eyes 2 (two) times daily.     ezetimibe (ZETIA) 10 MG tablet TAKE 1 TABLET BY MOUTH EVERY DAY 60 tablet 0   fluticasone (FLONASE) 50  MCG/ACT nasal spray SPRAY 2 SPRAYS INTO EACH NOSTRIL EVERY DAY 48 mL 1   Iodine Strong, Lugols, (IODINE STRONG PO) Take 225 mg by mouth daily.     Iodine, Kelp, (KELP PO) Take 56 mg by mouth daily.     levothyroxine (SYNTHROID) 100 MCG tablet TAKE 1 TABLET BY MOUTH EVERY DAY BEFORE BREAKFAST 90 tablet 3   memantine (NAMENDA) 5 MG tablet Take 5 mg by mouth 2 (two) times daily.     mometasone (ELOCON) 0.1 % cream Place in external ear canal as needed for itching 15 g 1   OVER THE COUNTER MEDICATION Folate 1360 mcg 1 Daily     rosuvastatin (CRESTOR) 10 MG tablet Take 1 tablet (10 mg total) by mouth every other day. 45 tablet 3   Saccharomyces boulardii (PROBIOTIC) 250 MG CAPS Take 1 capsule by mouth daily at 12 noon.     sertraline (ZOLOFT) 50 MG tablet Take 1.5 tablets (75 mg total) by mouth daily. 135 tablet 1   testosterone cypionate (DEPOTESTOSTERONE CYPIONATE) 200 MG/ML injection INJECT 0.5 ML (100 MG TOTAL) INTO THE MUSCLE EVERY 14 DAYS. 1 mL 2   vitamin B-12 (CYANOCOBALAMIN) 500 MCG tablet Take 1 tablet by mouth daily.     ZINC GLUCONATE PO Take 80 mg by mouth daily.     No current facility-administered medications for this visit.    Allergies:   Penicillins, Benzonatate, Lexapro [escitalopram], and Molnupiravir   Social History:  The patient  reports that he has never smoked. He has never used smokeless tobacco. He reports current alcohol use. He reports that he does not use drugs.   Family History:   family history includes Cancer in his sister; Heart disease in his father and mother; Hypertension in his mother.   Review of Systems: Review of Systems  Constitutional: Negative.   Respiratory: Negative.    Cardiovascular: Negative.   Gastrointestinal: Negative.   Musculoskeletal: Negative.   Neurological: Negative.   Psychiatric/Behavioral: Negative.    All other systems reviewed and are negative.  PHYSICAL EXAM: Vitals:   09/25/22 1147  BP: (!) 100/50  Pulse: (!) 49  SpO2:  90%    Body mass index is 20.31 kg/m.  Vitals:   09/25/22 1147  Weight: 162 lb 8 oz (73.7 kg)  Height: 6\' 3"  (1.905 m)   Constitutional:  oriented to person, place, and time. No distress.  HENT:  Head: Grossly normal Eyes:  no discharge. No scleral icterus.  Neck: No JVD, no carotid bruits  Cardiovascular: Regular rate and rhythm, no murmurs appreciated Pulmonary/Chest: Clear to auscultation bilaterally, no wheezes or rails Abdominal: Soft.  no distension.  no tenderness.  Musculoskeletal: Normal range of motion Neurological:  normal muscle tone. Coordination normal. No atrophy Skin: Skin warm and dry Psychiatric: normal affect, pleasant  Recent Labs: 12/05/2021: Magnesium 2.1 05/03/2022: ALT 16; TSH 1.37 06/21/2022: BUN 35; Creatinine, Ser 1.22; Hemoglobin 13.7; Platelets 217; Potassium 4.8; Sodium 140    Lipid Panel Lab Results  Component Value Date   CHOL 197 09/21/2020   HDL 62.00 09/21/2020  LDLCALC 121 (H) 09/21/2020   TRIG 70.0 09/21/2020      Wt Readings from Last 3 Encounters:  09/25/22 162 lb 8 oz (73.7 kg)  09/18/22 160 lb (72.6 kg)  09/06/22 162 lb 9.6 oz (73.8 kg)     ASSESSMENT AND PLAN:  Bradycardia  Asymptomatic bradycardia,  Reasonable chronotropic competence, rate into the 80s and higher on exertion  Hypotension Denies orthostasis symptoms, recommend he increase calorie intake to avoid further weight loss Suggested aggressive hydration, liberalize his salt intake  Depression with anxiety Managed by primary care In good spirits  Aortic atherosclerosis (HCC) Zetia refilled Does not want a statin  COPD Managed by pulmonary, on inhalers Chronic shortness of breath,  Regular walking program, winded after 30 minutes of walking but recovers relatively quickly  Coronary artery disease involving native coronary artery of native heart with angina pectoris (HCC) Chronic shortness of breath, previously declined stress testing Normal  echocardiogram Recommend he continue his walking program  PAD Mild to moderate aortic atherosclerosis seen on prior imaging He does not want a statin On Zetia  Essential tremor Prior successful procedure Stable  Falls Recent fall in the bathroom, laceration to back of his head requiring staples, scheduled to be pulled later on today Working with PT twice a week, does exercises on other days    Total encounter time more than 40 minutes  Greater than 50% was spent in counseling and coordination of care with the patient    Orders Placed This Encounter  Procedures   EKG 12-Lead      Signed, Dossie Arbour, M.D., Ph.D. 09/25/2022  [facility] [place], [place] [phone]

## 2022-09-25 ENCOUNTER — Ambulatory Visit
Admission: EM | Admit: 2022-09-25 | Discharge: 2022-09-25 | Disposition: A | Discharge status: Home / Self Care | Payer: PPO | Attending: Internal Medicine | Admitting: Internal Medicine

## 2022-09-25 ENCOUNTER — Encounter: Payer: Self-pay | Admitting: Emergency Medicine

## 2022-09-25 ENCOUNTER — Encounter: Payer: Self-pay | Admitting: Cardiovascular Disease

## 2022-09-25 ENCOUNTER — Ambulatory Visit: Payer: PPO | Attending: Cardiovascular Disease | Admitting: Cardiovascular Disease

## 2022-09-25 VITALS — BP 100/50 | HR 49 | Ht 75.0 in | Wt 162.5 lb

## 2022-09-25 DIAGNOSIS — E782 Mixed hyperlipidemia: Secondary | ICD-10-CM

## 2022-09-25 DIAGNOSIS — I471 Supraventricular tachycardia, unspecified: Secondary | ICD-10-CM | POA: Diagnosis not present

## 2022-09-25 DIAGNOSIS — R001 Bradycardia, unspecified: Secondary | ICD-10-CM | POA: Diagnosis not present

## 2022-09-25 DIAGNOSIS — I493 Ventricular premature depolarization: Secondary | ICD-10-CM | POA: Diagnosis not present

## 2022-09-25 DIAGNOSIS — S0101XD Laceration without foreign body of scalp, subsequent encounter: Secondary | ICD-10-CM

## 2022-09-25 DIAGNOSIS — I739 Peripheral vascular disease, unspecified: Secondary | ICD-10-CM | POA: Diagnosis not present

## 2022-09-25 DIAGNOSIS — I700 Atherosclerosis of aorta: Secondary | ICD-10-CM | POA: Diagnosis not present

## 2022-09-25 DIAGNOSIS — I2510 Atherosclerotic heart disease of native coronary artery without angina pectoris: Secondary | ICD-10-CM | POA: Diagnosis not present

## 2022-09-25 DIAGNOSIS — R55 Syncope and collapse: Secondary | ICD-10-CM

## 2022-09-25 DIAGNOSIS — J449 Chronic obstructive pulmonary disease, unspecified: Secondary | ICD-10-CM | POA: Diagnosis not present

## 2022-09-25 NOTE — Patient Instructions (Signed)
 Medication Instructions:  No changes  If you need a refill on your cardiac medications before your next appointment, please call your pharmacy.    Lab work: No new labs needed   Testing/Procedures: No new testing needed   Follow-Up: At [facility], you and your health needs are our priority.  As part of our continuing mission to provide you with exceptional heart care, we have created designated Provider Care Teams.  These Care Teams include your primary Cardiologist (physician) and Advanced Practice Providers (APPs -  Physician Assistants and Nurse Practitioners) who all work together to provide you with the care you need, when you need it.  You will need a follow up appointment in 6 months  Providers on your designated Care Team:   Christopher Berge, NP Ryan Dunn, PA-C Cadence Furth, PA-C  COVID-19 Vaccine Information can be found at: [email or web address] For questions related to vaccine distribution or appointments, please email [email or web address] or call [phone].   

## 2022-09-25 NOTE — Discharge Instructions (Signed)
Shower as usual except avoid water submersion like swimming in a lake or pool for one more week.

## 2022-09-25 NOTE — ED Provider Notes (Signed)
Theodore [name]    CSN: [id number] Arrival date & time: 09/25/22  1507      History   Chief Complaint Chief Complaint  Patient presents with   Suture / Staple Removal    HPI Theodore [name] is a 89 y.o. male who is here for FU laceration of top scalp. Had staples placed 7 days ago and was told to have them removed in 7-10 days. Has been doing well.     Past Medical History:  Diagnosis Date   Actinic keratosis    Arthritis    knees   Asymptomatic Sinus bradycardia    a. 05/2016 Zio: Avg HR 61 (41-167).   Benign essential tremor    Cancer (HCC) 2002   melanoma right ear   Colon polyp    COPD (chronic obstructive pulmonary disease) (HCC)    pt said he believes it is a misdiagnosis    Coronary artery calcification seen on CT scan    a. 06/2016 CTA Chest: cor Ca2+; b. 05/2017 MV: EF >65%. No ischemia/infarct.   COVID-19 virus infection 04/06/2020   Depression    Dysplastic nevus 04/25/2022   neck - anterior, severe, shave removal 06/05/22   History of echocardiogram    a. 04/2016 Echo: EF 60-65%, mild conc LVH. Nl PASP.   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 05/2016 Zio: Avg HR 61 (41-167). 9 SVT runs (fastest 167 - 5 beats; longest 8 beats - 101 bpm). Rare PACs/PVCs.   Pre-syncope    a. 06/2022 Zio: Predominantly sinus bradycardia with an average heart rate of 53 (40-148).  1 run of NSVT x 17 beats (143 bpm), 19 SVT runs (fastest 148 x 7 beats; longest 11 beats @ 114 bpm).  Rare PVCs/PACs. Triggered events assoc w/ sinus rhythym.   PSVT (paroxysmal supraventricular tachycardia)    a. 05/2016 Zio: 9 SVT runs; b. 06/2022 Zio: 19 SVT  runs, fastest 148, longest 11 beats.   PVC's (premature ventricular contractions)    Rhinitis    Sepsis due to cellulitis (HCC) 11/28/2021   Severe sepsis (HCC) 11/28/2021   Wears dentures    partial upper and lower   Wears hearing aid in both ears     Patient Active Problem List   Diagnosis Date Noted   Dysphagia as late effect of cerebrovascular disease 09/07/2022   Wound of left leg, initial encounter 06/12/2022   Sialorrhea 02/02/2022   Protein-calorie malnutrition, moderate (HCC) 11/29/2021   Dementia in Alzheimer's disease with depression (HCC) 11/28/2021   Dyslipidemia 11/28/2021   GERD without esophagitis 11/28/2021   Proximal muscle weakness 08/27/2021   Speech disturbance 08/26/2021   Adverse drug reaction, initial encounter 05/04/2021   Lumbar radiculopathy 04/25/2021   Thoracic aortic atherosclerosis (HCC) 01/17/2021   Anxiety in acute stress reaction 12/07/2020   Elevated PSA, between 10 and less than 20 ng/ml 11/30/2020   Chronic kidney disease, stage 3b (HCC) 11/10/2020   Benign hypertensive kidney disease with chronic kidney disease 11/10/2020   Acquired trigger finger of left middle finger 08/07/2019  Carpal tunnel syndrome of right wrist 07/10/2019   Ulnar neuropathy 06/05/2019   Localized, secondary osteoarthritis of the shoulder region 01/29/2019   Osteoarthritis of knee 01/29/2019   Chronic obstructive pulmonary disease (HCC) 09/24/2018   Cervical radiculopathy 08/14/2018   PAD (peripheral artery disease) (HCC)    Hypothyroidism 07/04/2014   Nonallergic vasomotor rhinitis 07/01/2014   Hypertrophy of prostate with urinary obstruction and other lower urinary tract symptoms (LUTS) 08/08/2013   Low serum testosterone level 04/28/2013   Actinic keratosis 05/10/2010   History of malignant melanoma of skin 05/10/2010    Past Surgical History:  Procedure Laterality Date   ADENOIDECTOMY     CATARACT EXTRACTION W/PHACO Left 10/22/2017   Procedure: CATARACT  EXTRACTION PHACO AND INTRAOCULAR LENS PLACEMENT (IOC)  LEFT;  Surgeon: Nevada Crane, MD;  Location: [facility];  Service: Ophthalmology;  Laterality: Left;   CATARACT EXTRACTION W/PHACO Right 11/20/2017   Procedure: CATARACT EXTRACTION PHACO AND INTRAOCULAR LENS PLACEMENT (IOC) RIGHT;  Surgeon: Nevada Crane, MD;  Location: [facility];  Service: Ophthalmology;  Laterality: Right;   ESOPHAGOGASTRODUODENOSCOPY (EGD) WITH PROPOFOL N/A 10/31/2016   Procedure: ESOPHAGOGASTRODUODENOSCOPY (EGD) WITH PROPOFOL;  Surgeon: Christena Deem, MD;  Location: [facility];  Service: Endoscopy;  Laterality: N/A;   I & D EXTREMITY Right 11/29/2021   Procedure: IRRIGATION AND DEBRIDEMENT EXTREMITY;  Surgeon: Henrene Dodge, MD;  Location: [facility];  Service: General;  Laterality: Right;   JOINT REPLACEMENT Right 2003   knee   knee meniscus repair Right    MELANOMA EXCISION Right    ear. Followed by Dr. Gwen Pounds   PATELLECTOMY Bilateral    TONSILLECTOMY         Home Medications    Prior to Admission medications   Medication Sig Start Date End Date Taking? Authorizing Provider  ALPRAZolam (XANAX) 0.25 MG tablet Take 1 tablet (0.25 mg total) by mouth 2 (two) times daily as needed for anxiety or sleep. 03/28/22   [name], MD  ascorbic acid (VITAMIN C) 500 MG tablet Take 500 mg by mouth daily.    [provider]  buPROPion (WELLBUTRIN SR) 150 MG 12 hr tablet Take 1 tablet (150 mg total) by mouth in the morning. 08/08/22   [name], MD  Cholecalciferol (VITAMIN D3) 50 MCG (2000 UT) TABS Take 1 tablet by mouth daily.    [provider]  cycloSPORINE (RESTASIS) 0.05 % ophthalmic emulsion Place 1 drop into both eyes 2 (two) times daily.    [provider]  ezetimibe (ZETIA) 10 MG tablet TAKE 1 TABLET BY MOUTH EVERY DAY 07/28/22   Antonieta Iba, MD  fluticasone (FLONASE) 50 MCG/ACT nasal spray SPRAY 2 SPRAYS INTO EACH NOSTRIL EVERY DAY 03/14/22    Erin Fulling, MD  Iodine Strong, Lug[place], (IODINE STRONG PO) Take 225 mg by mouth daily.    [provider]  Iodine, Kelp, (KELP PO) Take 56 mg by mouth daily.    [provider]  levothyroxine (SYNTHROID) 100 MCG tablet TAKE 1 TABLET BY MOUTH EVERY DAY BEFORE BREAKFAST 06/12/22   [name], MD  memantine (NAMENDA) 5 MG tablet Take 5 mg by mouth 2 (two) times daily. 07/07/22   [provider]  mometasone (ELOCON) 0.1 % cream Place in external ear canal as needed for itching 08/08/22   [name], MD  OVER THE COUNTER MEDICATION Folate 1360 mcg 1 Daily    [provider]  rosuvastatin (CRESTOR) 10 MG tablet Take 1  tablet (10 mg total) by mouth every other day. 06/12/22   [name], MD  Saccharomyces boulardii (PROBIOTIC) 250 MG CAPS Take 1 capsule by mouth daily at 12 noon.    [provider]  sertraline (ZOLOFT) 50 MG tablet Take 1.5 tablets (75 mg total) by mouth daily. 09/06/22   [name], MD  testosterone cypionate (DEPOTESTOSTERONE CYPIONATE) 200 MG/ML injection INJECT 0.5 ML (100 MG TOTAL) INTO THE MUSCLE EVERY 14 DAYS. 05/02/22   [name], MD  vitamin B-12 (CYANOCOBALAMIN) 500 MCG tablet Take 1 tablet by mouth daily.    [provider]  ZINC GLUCONATE PO Take 80 mg by mouth daily.    [provider]    Family History Family History  Problem Relation Age of Onset   Hypertension Mother    Heart disease Mother        CHF   Heart disease Father    Cancer Sister        melanoma    Social History Social History   Tobacco Use   Smoking status: Never   Smokeless tobacco: Never  Vaping Use   Vaping status: Never Used  Substance Use Topics   Alcohol use: Yes    Comment: 1 glass of wine daily   Drug use: No     Allergies   Penicillins, Benzonatate, Lexapro [escitalopram], and Molnupiravir   Review of Systems Review of Systems   Physical Exam Triage Vital Signs ED Triage Vitals [09/25/22  1523]  Encounter Vitals Group     BP      Systolic BP Percentile      Diastolic BP Percentile      Pulse      Resp      Temp      Temp src      SpO2      Weight      Height      Head Circumference      Peak Flow      Pain Score 0     Pain Loc      Pain Education      Exclude from Growth Chart    No data found.  Updated Vital Signs There were no vitals taken for this visit.  Visual Acuity Right Eye Distance:   Left Eye Distance:   Bilateral Distance:    Right Eye Near:   Left Eye Near:    Bilateral Near:     Physical Exam Vitals and nursing note reviewed.  Constitutional:      Appearance: He is normal weight.  HENT:     Head:     Comments: Has well healed laceration on top L scalp with 5 staples.     Right Ear: External ear normal.     Left Ear: External ear normal.  Pulmonary:     Effort: Pulmonary effort is normal.  Neurological:     Mental Status: He is alert.     Comments: Uses a walker   Psychiatric:        Mood and Affect: Mood normal.        Behavior: Behavior normal.      UC Treatments / Results  Labs (all labs ordered are listed, but only abnormal results are displayed) Labs Reviewed - No data to display  EKG   Radiology No results found.  Procedures Procedures (including critical care time)  Medications Ordered in UC Medications - No data to display  Initial Impression / Assessment and Plan /  UC Course  I have reviewed the triage vital signs and the nursing notes.  Scalp laceration  Staple were removed without complications See instructions Final Clinical Impressions(s) / UC Diagnoses   Final diagnoses:  Laceration of scalp, subsequent encounter     Discharge Instructions      Shower as usual except avoid water submersion like swimming in a lake or pool for one more week.     ED Prescriptions   None    PDMP not reviewed this encounter.   Garey Ham, PA-C 09/25/22 1529

## 2022-09-25 NOTE — ED Triage Notes (Addendum)
Staples placed in ED, wound edges well approximated, no warmth, no redness, no drainage. PA going to evaluate at this time.

## 2022-09-26 ENCOUNTER — Ambulatory Visit: Payer: PPO

## 2022-09-26 VITALS — Ht 75.0 in | Wt 155.0 lb

## 2022-09-26 DIAGNOSIS — Z0000 Encounter for general adult medical examination without abnormal findings: Secondary | ICD-10-CM | POA: Diagnosis not present

## 2022-09-26 NOTE — Progress Notes (Addendum)
Subjective:   Theodore [name] is a 89 y.o. male who presents for Medicare Annual/Subsequent preventive examination.  Visit Complete: Virtual  I connected with  Theodore [name] on 09/26/22 by a audio enabled telemedicine application and verified that I am speaking with the correct person using two identifiers.  Patient Location: Home  Provider Location: Office/Clinic  I discussed the limitations of evaluation and management by telemedicine. The patient expressed understanding and agreed to proceed.  Review of Systems     Cardiac Risk Factors include: advanced age (>67men, >69 women);sedentary lifestyle;male gender;dyslipidemia     Objective:    Today's Vitals   09/26/22 1034  Weight: 155 lb (70.3 kg)  Height: 6\' 3"  (1.905 m)   Body mass index is 19.37 kg/m.     09/26/2022   11:02 AM 08/11/2022    3:10 PM 06/02/2022    3:09 PM 12/22/2021   12:10 PM 12/19/2021    4:32 PM 12/15/2021    2:47 PM 12/09/2021   10:37 AM  Advanced Directives  Does Patient Have a Medical Advance Directive? Yes Yes Yes Yes Yes Yes No  Type of Estate agent of [place];Living will Healthcare Power of [place];Living will Healthcare Power of [place];Living will Healthcare Power of [employer] Power of [employer] Power of Attorney   Does patient want to make changes to medical advance directive? No - Patient declined   No - Patient declined No - Patient declined No - Patient declined No - Patient declined  Copy of Healthcare Power of Attorney in Chart? Yes - validated most recent copy scanned in chart (See row information)   Yes - validated most recent copy scanned in chart (See row information) Yes - validated most recent copy scanned in chart (See row information) Yes - validated most recent copy scanned in chart (See row information)     Current Medications (verified) Outpatient Encounter Medications as of 09/26/2022  Medication Sig   ALPRAZolam (XANAX) 0.25 MG tablet  Take 1 tablet (0.25 mg total) by mouth 2 (two) times daily as needed for anxiety or sleep.   ascorbic acid (VITAMIN C) 500 MG tablet Take 500 mg by mouth daily.   buPROPion (WELLBUTRIN SR) 150 MG 12 hr tablet Take 1 tablet (150 mg total) by mouth in the morning.   Cholecalciferol (VITAMIN D3) 50 MCG (2000 UT) TABS Take 1 tablet by mouth daily.   cycloSPORINE (RESTASIS) 0.05 % ophthalmic emulsion Place 1 drop into both eyes 2 (two) times daily.   ezetimibe (ZETIA) 10 MG tablet TAKE 1 TABLET BY MOUTH EVERY DAY   levothyroxine (SYNTHROID) 100 MCG tablet TAKE 1 TABLET BY MOUTH EVERY DAY BEFORE BREAKFAST   memantine (NAMENDA) 5 MG tablet Take 5 mg by mouth 2 (two) times daily.   mometasone (ELOCON) 0.1 % cream Place in external ear canal as needed for itching   OVER THE COUNTER MEDICATION Folate 1360 mcg 1 Daily   rosuvastatin (CRESTOR) 10 MG tablet Take 1 tablet (10 mg total) by mouth every other day.   Saccharomyces boulardii (PROBIOTIC) 250 MG CAPS Take 1 capsule by mouth daily at 12 noon.   sertraline (ZOLOFT) 50 MG tablet Take 1.5 tablets (75 mg total) by mouth daily.   testosterone cypionate (DEPOTESTOSTERONE CYPIONATE) 200 MG/ML injection INJECT 0.5 ML (100 MG TOTAL) INTO THE MUSCLE EVERY 14 DAYS.   vitamin B-12 (CYANOCOBALAMIN) 500 MCG tablet Take 1 tablet by mouth daily.   ZINC GLUCONATE PO Take 80 mg by mouth daily.  fluticasone (FLONASE) 50 MCG/ACT nasal spray SPRAY 2 SPRAYS INTO EACH NOSTRIL EVERY DAY (Patient not taking: Reported on 09/26/2022)   Iodine Strong, Lugols, (IODINE STRONG PO) Take 225 mg by mouth daily. (Patient not taking: Reported on 09/26/2022)   Iodine, Kelp, (KELP PO) Take 56 mg by mouth daily. (Patient not taking: Reported on 09/26/2022)   No facility-administered encounter medications on file as of 09/26/2022.    Allergies (verified) Penicillins, Benzonatate, Lexapro [escitalopram], and Molnupiravir   History: Past Medical History:  Diagnosis Date   Actinic  keratosis    Arthritis    knees   Asymptomatic Sinus bradycardia    a. 05/2016 Zio: Avg HR 61 (41-167).   Benign essential tremor    Cancer (HCC) 2002   melanoma right ear   Colon polyp    COPD (chronic obstructive pulmonary disease) (HCC)    pt said he believes it is a misdiagnosis    Coronary artery calcification seen on CT scan    a. 06/2016 CTA Chest: cor Ca2+; b. 05/2017 MV: EF >65%. No ischemia/infarct.   COVID-19 virus infection 04/06/2020   Depression    Dysplastic nevus 04/25/2022   neck - anterior, severe, shave removal 06/05/22   History of echocardiogram    a. 04/2016 Echo: EF 60-65%, mild conc LVH. Nl PASP.   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 05/2016 Zio: Avg HR 61 (41-167). 9 SVT runs (fastest 167 - 5 beats; longest 8 beats - 101 bpm). Rare PACs/PVCs.   Pre-syncope    a. 06/2022 Zio: Predominantly sinus bradycardia with an average heart rate of 53 (40-148).  1 run of NSVT x 17 beats (143 bpm), 19 SVT runs (fastest 148 x 7 beats; longest 11 beats @ 114 bpm).  Rare PVCs/PACs. Triggered events assoc w/ sinus rhythym.   PSVT (paroxysmal supraventricular tachycardia)    a. 05/2016 Zio: 9 SVT runs; b. 06/2022 Zio: 19 SVT runs, fastest 148, longest 11 beats.   PVC's (premature ventricular contractions)    Rhinitis    Sepsis due to cellulitis (HCC) 11/28/2021   Severe sepsis (HCC) 11/28/2021   Wears dentures    partial upper and lower   Wears hearing aid in both ears    Past Surgical History:  Procedure Laterality Date   ADENOIDECTOMY     CATARACT EXTRACTION W/PHACO Left 10/22/2017   Procedure: CATARACT EXTRACTION PHACO AND  INTRAOCULAR LENS PLACEMENT (IOC)  LEFT;  Surgeon: Nevada Crane, MD;  Location: [facility];  Service: Ophthalmology;  Laterality: Left;   CATARACT EXTRACTION W/PHACO Right 11/20/2017   Procedure: CATARACT EXTRACTION PHACO AND INTRAOCULAR LENS PLACEMENT (IOC) RIGHT;  Surgeon: Nevada Crane, MD;  Location: [facility];  Service: Ophthalmology;  Laterality: Right;   ESOPHAGOGASTRODUODENOSCOPY (EGD) WITH PROPOFOL N/A 10/31/2016   Procedure: ESOPHAGOGASTRODUODENOSCOPY (EGD) WITH PROPOFOL;  Surgeon: Christena Deem, MD;  Location: [facility];  Service: Endoscopy;  Laterality: N/A;   I & D EXTREMITY Right 11/29/2021   Procedure: IRRIGATION AND DEBRIDEMENT EXTREMITY;  Surgeon: Henrene Dodge, MD;  Location: [facility] OR[facility];  Service: General;  Laterality: Right;   JOINT REPLACEMENT Right 2003   knee   knee meniscus repair Right    LEG WOUND REPAIR / CLOSURE  11/2021   MELANOMA EXCISION Right    ear. Followed by Dr. Gwen Pounds   PATELLECTOMY Bilateral    TONSILLECTOMY     Family History  Problem Relation Age of Onset   Hypertension Mother    Heart disease Mother        CHF   Heart disease Father    Cancer Sister        melanoma   Social History   Socioeconomic History   Marital status: Married    Spouse name: [name]   Number of children: 4   Years of education: 16   Highest education level: Not on file  Occupational History   Occupation: Retired    Comment: Holiday representative -   Tobacco Use   Smoking status: Never   Smokeless tobacco: Never  Vaping Use   Vaping status: Never Used  Substance and Sexual Activity   Alcohol use: Yes    Comment: 1 glass of wine daily   Drug use: No   Sexual activity: Not Currently  Other Topics Concern   Not on file  Social History Narrative   Mr. Branigan grew up in [place], [place]. He attended [employer] in [place] and obtained his Oncologist in [employer]. 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 He and his wife are living at [facility]. He is very active at [facility]. He enjoys music.    Social Determinants of Health   Financial Resource Strain: Low Risk  (09/26/2022)   Overall Financial Resource Strain (CARDIA)    Difficulty of Paying Living Expenses: Not hard at all  Food Insecurity: No Food Insecurity (09/26/2022)   Hunger Vital Sign    Worried About Running Out of Food in the Last Year: Never true    Ran Out of Food in the Last Year: Never true  Transportation Needs: No Transportation Needs (09/26/2022)   PRAPARE - Administrator, Civil Service (Medical): No    Lack of Transportation (Non-Medical): No  Physical Activity: Insufficiently Active (09/26/2022)   Exercise Vital Sign    Days of Exercise per Week: 2 days    Minutes of Exercise per Session: 60 min  Stress: No Stress Concern Present (09/26/2022)   [employer] of Occupational Health - Occupational Stress Questionnaire    Feeling of Stress : Only a little  Social Connections: Moderately Integrated (09/26/2022)   Social Connection and Isolation Panel [NHANES]    Frequency of Communication with Friends and Family: More than three times a week    Frequency of Social Gatherings with Friends and Family: More than three times a week    Attends Religious Services: More than 4 times per year    Active Member of [employer] or Organizations: No    Attends Engineer, structural: Never    Marital Status: Married    Tobacco Counseling Counseling given: Not Answered   Clinical Intake:  Pre-visit preparation completed: Yes  Pain : No/denies pain     BMI - recorded: 19.37 Nutritional Status: BMI of 19-24  Normal Nutritional Risks: None Diabetes: No  How often do you need to have someone help you when you read instructions, pamphlets, or other written materials from your doctor or  pharmacy?:  1 - Never  Interpreter Needed?: No  Information entered by :: R. Sybella Harnish LPN   Activities of Daily Living    09/26/2022   10:38 AM 11/28/2021    6:39 PM  In your present state of health, do you have any difficulty performing the following activities:  Hearing? 1   Comment wears aids   Vision? 0   Comment glasses   Difficulty concentrating or making decisions? 1   Walking or climbing stairs? 1   Comment walker   Dressing or bathing? 0   Doing errands, shopping? 1 0  Comment wife drives him   Quarry manager and eating ? N   Using the Toilet? N   In the past six months, have you accidently leaked urine? N   Do you have problems with loss of bowel control? N   Managing your Medications? Y   Comment wife takes care of   Managing your Finances? Y   Comment wife helps   Housekeeping or managing your Housekeeping? Y   Comment wife helps     Patient Care Team: [name], MD as PCP - General (Internal Medicine) Antonieta Iba, MD as PCP - Cardiology (Cardiology) Lonell Face, MD as Consulting Physician (Neurology) Georgiana Spinner, NP as Nurse Practitioner (Vascular Surgery) Erin Fulling, MD as Consulting Physician (Pulmonary Disease) Sondra Come, MD as Consulting Physician (Urology) Donovan Kail, PA-C as Physician Assistant (Physician Assistant)  Indicate any recent Medical Services you may have received from other than [facility] providers in the past year (date may be approximate).     Assessment:   This is a routine wellness examination for [place].  Hearing/Vision screen Hearing Screening - Comments:: Wears aids Vision Screening - Comments:: glasses  Dietary issues and exercise activities discussed:     Goals Addressed             This Visit's Progress    Patient Stated       Taking PT to help with building stength       Depression Screen    09/26/2022   10:47 AM 09/06/2022   11:01 AM 08/08/2022   11:20 AM 07/12/2022   10:38 AM  06/12/2022    8:32 AM 05/03/2022   10:05 AM 04/04/2022    9:30 AM  PHQ 2/9 Scores  PHQ - 2 Score 2 2 4 1  0 3 5  PHQ- 9 Score 6 6 12 4  11 11     Fall Risk    09/26/2022   10:56 AM 09/06/2022   11:01 AM 08/08/2022   11:19 AM 07/12/2022   10:38 AM 05/03/2022   10:04 AM  Fall Risk   Falls in the past year? 1 1 1 1 1   Number falls in past yr: 1 1 1 1 1   Injury with Fall? 1 1 1  0 1  Risk for fall due to : History of fall(s);Medication side effect;Impaired balance/gait History of fall(s) History of fall(s) History of fall(s) History of fall(s)  Follow up Falls evaluation completed;Education provided;Falls prevention discussed Falls evaluation completed Falls evaluation completed Falls evaluation completed Falls evaluation completed    MEDICARE RISK AT HOME:  Medicare Risk at Home - 09/26/22 1057     Any stairs in or around the home? Yes    If so, are there any without handrails? Yes    Home free of loose throw rugs in walkways, pet beds, electrical cords, etc? Yes    Adequate lighting in your home to  reduce risk of falls? Yes    Life alert? Yes    Use of a cane, walker or w/c? Yes   walker   Grab bars in the bathroom? Yes    Shower chair or bench in shower? Yes    Elevated toilet seat or a handicapped toilet? Yes             T Cognitive Function:    03/30/2015    2:42 PM  MMSE - Mini Mental State Exam  Orientation to time 5  Orientation to Place 5  Registration 3  Attention/ Calculation 5  Recall 3  Language- name 2 objects 2  Language- repeat 1  Language- follow 3 step command 3  Language- read & follow direction 1  Write a sentence 0  Write a sentence-comments Some difficulty. Tremors.  Copy design 1  Total score 29        09/26/2022   11:02 AM 05/29/2017    9:50 AM 04/14/2016    4:35 PM  6CIT Screen  What Year? 0 points 0 points 0 points  What month? 0 points 0 points 0 points  What time? 0 points 0 points 0 points  Count back from 20 2 points 0 points 0 points   Months in reverse 4 points 0 points 0 points  Repeat phrase 6 points 0 points 0 points  Total Score 12 points 0 points 0 points    Immunizations Immunization History  Administered Date(s) Administered   Fluad Quad(high Dose 65+) 12/13/2018, 12/21/2020   Influenza,inj,Quad PF,6+ Mos 01/14/2014, 01/25/2018   Influenza-Unspecified 12/11/2012, 12/26/2019, 12/28/2021   Moderna Sars-Covid-2 Vaccination 04/25/2019, 05/16/2019, 06/26/2019, 01/17/2020   Pneumococcal Conjugate-13 01/14/2014, 10/04/2018, 12/13/2018   Pneumococcal Polysaccharide-23 04/28/2012   Td 12/02/2021   Tdap 08/19/2014   Zoster Recombinant(Shingrix) 03/12/2017, 09/19/2021   Zoster, Live 04/28/2010    TDAP status: Up to date  Flu Vaccine status: Up to date  Pneumococcal vaccine status: Up to date  Covid-19 vaccine status: Completed vaccines  Qualifies for Shingles Vaccine? Yes   Zostavax completed Yes   Shingrix Completed?: Yes  Screening Tests Health Maintenance  Topic Date Due   COVID-19 Vaccine (5 - 2023-24 season) 11/11/2021   Medicare Annual Wellness (AWV)  08/06/2022   INFLUENZA VACCINE  10/12/2022   DTaP/Tdap/Td (3 - Td or Tdap) 12/03/2031   Pneumonia Vaccine 12+ Years old  Completed   Zoster Vaccines- Shingrix  Completed   HPV VACCINES  Aged Out    Health Maintenance  Health Maintenance Due  Topic Date Due   COVID-19 Vaccine (5 - 2023-24 season) 11/11/2021   Medicare Annual Wellness (AWV)  08/06/2022    Colorectal cancer screening: No longer required.   Lung Cancer Screening: (Low Dose CT Chest recommended if Age 61-80 years, 20 pack-year currently smoking OR have quit w/in 15years.) does not qualify.     Additional Screening:  Hepatitis C Screening: does not qualify; Completed NA age  Vision Screening: Recommended annual ophthalmology exams for early detection of glaucoma and other disorders of the eye. Is the patient up to date with their annual eye exam?  Yes  Who is the provider  or what is the name of the office in which the patient attends annual eye exams? [facility] If pt is not established with a provider, would they like to be referred to a provider to establish care? No .   Dental Screening: Recommended annual dental exams for proper oral hygiene     Community Resource Referral /  Chronic Care Management: CRR required this visit?  No   CCM required this visit?  No     Plan:     I have personally reviewed and noted the following in the patient's chart:   Medical and social history Use of alcohol, tobacco or illicit drugs  Current medications and supplements including opioid prescriptions. Patient is not currently taking opioid prescriptions. Functional ability and status Nutritional status Physical activity Advanced directives List of other physicians Hospitalizations, surgeries, and ER visits in previous 12 months Vitals Screenings to include cognitive, depression, and falls Referrals and appointments  In addition, I have reviewed and discussed with patient certain preventive protocols, quality metrics, and best practice recommendations. A written personalized care plan for preventive services as well as general preventive health recommendations were provided to patient.     Sydell Axon, LPN   [id number]/[id number]   After Visit Summary: (MyChart) Due to this being a telephonic visit, the after visit summary with patients personalized plan was offered to patient via MyChart   Nurse Notes: Patient's wife helped with the visit because patient was having difficulty hearing.   I have reviewed the above information and agree with above.   Duncan Dull, MD

## 2022-09-26 NOTE — Patient Instructions (Signed)
Per patient no change in vitals since last visit, unable to obtain new vitals due to telehealth visit  [name] , Thank you for taking time to come for your Medicare Wellness Visit. I appreciate your ongoing commitment to your health goals. Please review the following plan we discussed and let me know if I can assist you in the future.   These are the goals we discussed:  Goals       Follow up with pcp (pt-stated)      As needed      Patient Stated      Taking PT to help with building stength        This is a list of the screening recommended for you and due dates:  Health Maintenance  Topic Date Due   COVID-19 Vaccine (5 - 2023-24 season) 11/11/2021   Flu Shot  10/12/2022   Medicare Annual Wellness Visit  09/26/2023   DTaP/Tdap/Td vaccine (3 - Td or Tdap) 12/03/2031   Pneumonia Vaccine  Completed   Zoster (Shingles) Vaccine  Completed   HPV Vaccine  Aged Out    Advanced directives: On file  Conditions/risks identified: None  Next appointment: Follow up in one year for your annual wellness visit. Patient declined to schedule follow-up visit today.  Preventive Care 39 Years and Older, Male  Preventive care refers to lifestyle choices and visits with your health care provider that can promote health and wellness. What does preventive care include? A yearly physical exam. This is also called an annual well check. Dental exams once or twice a year. Routine eye exams. Ask your health care provider how often you should have your eyes checked. Personal lifestyle choices, including: Daily care of your teeth and gums. Regular physical activity. Eating a healthy diet. Avoiding tobacco and drug use. Limiting alcohol use. Practicing safe sex. Taking low doses of aspirin every day. Taking vitamin and mineral supplements as recommended by your health care provider. What happens during an annual well check? The services and screenings done by your health care provider during  your annual well check will depend on your age, overall health, lifestyle risk factors, and family history of disease. Counseling  Your health care provider may ask you questions about your: Alcohol use. Tobacco use. Drug use. Emotional well-being. Home and relationship well-being. Sexual activity. Eating habits. History of falls. Memory and ability to understand (cognition). Work and work Astronomer. Screening  You may have the following tests or measurements: Height, weight, and BMI. Blood pressure. Lipid and cholesterol levels. These may be checked every 5 years, or more frequently if you are over 43 years old. Skin check. Lung cancer screening. You may have this screening every year starting at age 87 if you have a 30-pack-year history of smoking and currently smoke or have quit within the past 15 years. Fecal occult blood test (FOBT) of the stool. You may have this test every year starting at age 87. Flexible sigmoidoscopy or colonoscopy. You may have a sigmoidoscopy every 5 years or a colonoscopy every 10 years starting at age 87. Prostate cancer screening. Recommendations will vary depending on your family history and other risks. Hepatitis C blood test. Hepatitis B blood test. Sexually transmitted disease (STD) testing. Diabetes screening. This is done by checking your blood sugar (glucose) after you have not eaten for a while (fasting). You may have this done every 1-3 years. Abdominal aortic aneurysm (AAA) screening. You may need this if you are a current or former smoker.  Osteoporosis. You may be screened starting at age 87 if you are at high risk. Talk with your health care provider about your test results, treatment options, and if necessary, the need for more tests. Vaccines  Your health care provider may recommend certain vaccines, such as: Influenza vaccine. This is recommended every year. Tetanus, diphtheria, and acellular pertussis (Tdap, Td) vaccine. You may need  a Td booster every 10 years. Zoster vaccine. You may need this after age 87. Pneumococcal 13-valent conjugate (PCV13) vaccine. One dose is recommended after age 87. Pneumococcal polysaccharide (PPSV23) vaccine. One dose is recommended after age 87. Talk to your health care provider about which screenings and vaccines you need and how often you need them. This information is not intended to replace advice given to you by your health care provider. Make sure you discuss any questions you have with your health care provider. Document Released: 03/26/2015 Document Revised: 11/17/2015 Document Reviewed: 12/29/2014 Elsevier Interactive Patient Education  2017 [employer].  Fall Prevention in the Home Falls can cause injuries. They can happen to people of all ages. There are many things you can do to make your home safe and to help prevent falls. What can I do on the outside of my home? Regularly fix the edges of walkways and driveways and fix any cracks. Remove anything that might make you trip as you walk through a door, such as a raised step or threshold. Trim any bushes or trees on the path to your home. Use bright outdoor lighting. Clear any walking paths of anything that might make someone trip, such as rocks or tools. Regularly check to see if handrails are loose or broken. Make sure that both sides of any steps have handrails. Any raised decks and porches should have guardrails on the edges. Have any leaves, snow, or ice cleared regularly. Use sand or salt on walking paths during winter. Clean up any spills in your garage right away. This includes oil or grease spills. What can I do in the bathroom? Use night lights. Install grab bars by the toilet and in the tub and shower. Do not use towel bars as grab bars. Use non-skid mats or decals in the tub or shower. If you need to sit down in the shower, use a plastic, non-slip stool. Keep the floor dry. Clean up any water that spills on the  floor as soon as it happens. Remove soap buildup in the tub or shower regularly. Attach bath mats securely with double-sided non-slip rug tape. Do not have throw rugs and other things on the floor that can make you trip. What can I do in the bedroom? Use night lights. Make sure that you have a light by your bed that is easy to reach. Do not use any sheets or blankets that are too big for your bed. They should not hang down onto the floor. Have a firm chair that has side arms. You can use this for support while you get dressed. Do not have throw rugs and other things on the floor that can make you trip. What can I do in the kitchen? Clean up any spills right away. Avoid walking on wet floors. Keep items that you use a lot in easy-to-reach places. If you need to reach something above you, use a strong step stool that has a grab bar. Keep electrical cords out of the way. Do not use floor polish or wax that makes floors slippery. If you must use wax, use non-skid floor  wax. Do not have throw rugs and other things on the floor that can make you trip. What can I do with my stairs? Do not leave any items on the stairs. Make sure that there are handrails on both sides of the stairs and use them. Fix handrails that are broken or loose. Make sure that handrails are as long as the stairways. Check any carpeting to make sure that it is firmly attached to the stairs. Fix any carpet that is loose or worn. Avoid having throw rugs at the top or bottom of the stairs. If you do have throw rugs, attach them to the floor with carpet tape. Make sure that you have a light switch at the top of the stairs and the bottom of the stairs. If you do not have them, ask someone to add them for you. What else can I do to help prevent falls? Wear shoes that: Do not have high heels. Have rubber bottoms. Are comfortable and fit you well. Are closed at the toe. Do not wear sandals. If you use a stepladder: Make sure that  it is fully opened. Do not climb a closed stepladder. Make sure that both sides of the stepladder are locked into place. Ask someone to hold it for you, if possible. Clearly mark and make sure that you can see: Any grab bars or handrails. First and last steps. Where the edge of each step is. Use tools that help you move around (mobility aids) if they are needed. These include: Canes. Walkers. Scooters. Crutches. Turn on the lights when you go into a dark area. Replace any light bulbs as soon as they burn out. Set up your furniture so you have a clear path. Avoid moving your furniture around. If any of your floors are uneven, fix them. If there are any pets around you, be aware of where they are. Review your medicines with your doctor. Some medicines can make you feel dizzy. This can increase your chance of falling. Ask your doctor what other things that you can do to help prevent falls. This information is not intended to replace advice given to you by your health care provider. Make sure you discuss any questions you have with your health care provider. Document Released: 12/24/2008 Document Revised: 08/05/2015 Document Reviewed: 04/03/2014 Elsevier Interactive Patient Education  2017 [employer].

## 2022-10-11 ENCOUNTER — Ambulatory Visit (INDEPENDENT_AMBULATORY_CARE_PROVIDER_SITE_OTHER): Payer: PPO | Admitting: Nurse Practitioner

## 2022-10-11 ENCOUNTER — Ambulatory Visit (INDEPENDENT_AMBULATORY_CARE_PROVIDER_SITE_OTHER): Payer: PPO

## 2022-10-11 ENCOUNTER — Encounter (INDEPENDENT_AMBULATORY_CARE_PROVIDER_SITE_OTHER): Payer: Self-pay | Admitting: Nurse Practitioner

## 2022-10-11 VITALS — BP 109/63 | HR 60 | Resp 18 | Ht 75.0 in | Wt 155.0 lb

## 2022-10-11 DIAGNOSIS — I739 Peripheral vascular disease, unspecified: Secondary | ICD-10-CM

## 2022-10-11 DIAGNOSIS — J449 Chronic obstructive pulmonary disease, unspecified: Secondary | ICD-10-CM

## 2022-10-11 DIAGNOSIS — L97929 Non-pressure chronic ulcer of unspecified part of left lower leg with unspecified severity: Secondary | ICD-10-CM

## 2022-10-11 DIAGNOSIS — E785 Hyperlipidemia, unspecified: Secondary | ICD-10-CM

## 2022-10-11 NOTE — Progress Notes (Unsigned)
Subjective:    Patient ID: Theodore [name], male    DOB: 10/15/1929, 89 y.o.   MRN: [id number] Chief Complaint  Patient presents with  . Follow-up    1 year f/u    HPI  Review of Systems     Objective:   Physical Exam  BP 109/63 (BP Location: Right Arm)   Pulse 60   Resp 18   Ht 6\' 3"  (1.905 m)   Wt 155 lb (70.3 kg)   BMI 19.37 kg/m   Past Medical History:  Diagnosis Date  . Actinic keratosis   . Arthritis    knees  . Asymptomatic Sinus bradycardia    a. 05/2016 Zio: Avg HR 61 (41-167).  . Benign essential tremor   . Cancer [facility]) 2002   melanoma right ear  . Colon polyp   . COPD (chronic obstructive pulmonary disease) (HCC)    pt said he believes it is a misdiagnosis   . Coronary artery calcification seen on CT scan    a. 06/2016 CTA Chest: cor Ca2+; b. 05/2017 MV: EF >65%. No ischemia/infarct.  [name] COVID-19 virus infection 04/06/2020  . Depression   . Dysplastic nevus 04/25/2022   neck - anterior, severe, shave removal 06/05/22  . History of echocardiogram    a. 04/2016 Echo: EF 60-65%, mild conc LVH. Nl PASP.  [name] History of kidney stones   . Hx of dysplastic nevus 05/07/2017   L anterior shoulder, severe  . Hx of dysplastic nevus 09/06/2020   R lower sternum, moderate atypia  . Hypothyroidism   . Hypothyroidism   . Laceration of right lower extremity   . Melanoma (HCC) 1990's per pt   R ear  . 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 MSSA infection, non-invasive   . Palpitations    a. 05/2016 Zio: Avg HR 61 (41-167). 9 SVT runs (fastest 167 - 5 beats; longest 8 beats - 101 bpm). Rare PACs/PVCs.  . Pre-syncope    a. 06/2022 Zio: Predominantly sinus bradycardia with an average heart rate of 53 (40-148).  1 run of NSVT x 17 beats (143 bpm), 19 SVT runs (fastest 148 x 7 beats; longest 11 beats @ 114 bpm).  Rare PVCs/PACs. Triggered events assoc w/ sinus rhythym.  [name] PSVT (paroxysmal supraventricular tachycardia)    a. 05/2016 Zio: 9 SVT runs; b. 06/2022 Zio:  19 SVT runs, fastest 148, longest 11 beats.  [name] PVC's (premature ventricular contractions)   . Rhinitis   . Sepsis due to cellulitis (HCC) 11/28/2021  . Severe sepsis (HCC) 11/28/2021  . Wears dentures    partial upper and lower  . Wears hearing aid in both ears     Social History   Socioeconomic History  . Marital status: Married    Spouse name: [name]  . Number of children: 4  . Years of education: 18  . Highest education level: Not on file  Occupational History  . Occupation: Retired    Comment: Holiday representative -   Tobacco Use  . Smoking status: Never  . Smokeless tobacco: Never  Vaping Use  . Vaping status: Never Used  Substance and Sexual Activity  . Alcohol use: Yes    Comment: 1 glass of wine daily  . Drug use: No  . Sexual activity: Not Currently  Other Topics Concern  . Not on file  Social History Narrative   Theodore [name] grew up in [place], [place]. He attended [employer] in [place] and obtained  his Bachelor's Degree in [employer]. 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 He and his wife are living at [facility]. He is very active at [facility]. He enjoys music.    Social Determinants of Health   Financial Resource Strain: Low Risk  (09/26/2022)   Overall Financial Resource Strain (CARDIA)   . Difficulty of Paying Living Expenses: Not hard at all  Food Insecurity: No Food Insecurity (09/26/2022)   Hunger Vital Sign   . Worried About Programme researcher, broadcasting/film/video in the Last Year: Never true   . Ran Out of Food in the Last Year: Never true  Transportation Needs: No Transportation Needs (09/26/2022)   PRAPARE - Transportation   . Lack of Transportation (Medical): No   . Lack of Transportation (Non-Medical): No  Physical Activity: Insufficiently Active (09/26/2022)   Exercise Vital Sign   . Days of Exercise per Week: 2 days   . Minutes of  Exercise per Session: 60 min  Stress: No Stress Concern Present (09/26/2022)   [employer] of Occupational Health - Occupational Stress Questionnaire   . Feeling of Stress : Only a little  Social Connections: Moderately Integrated (09/26/2022)   Social Connection and Isolation Panel [NHANES]   . Frequency of Communication with Friends and Family: More than three times a week   . Frequency of Social Gatherings with Friends and Family: More than three times a week   . Attends Religious Services: More than 4 times per year   . Active Member of Clubs or Organizations: No   . Attends Banker Meetings: Never   . Marital Status: Married  Catering manager Violence: Not At Risk (09/26/2022)   Humiliation, Afraid, Rape, and Kick questionnaire   . Fear of Current or Ex-Partner: No   . Emotionally Abused: No   . Physically Abused: No   . Sexually Abused: No    Past Surgical History:  Procedure Laterality Date  . ADENOIDECTOMY    . CATARACT EXTRACTION W/PHACO Left 10/22/2017   Procedure: CATARACT EXTRACTION PHACO AND INTRAOCULAR LENS PLACEMENT (IOC)  LEFT;  Surgeon: Nevada Crane, MD;  Location: [facility];  Service: Ophthalmology;  Laterality: Left;  . CATARACT EXTRACTION W/PHACO Right 11/20/2017   Procedure: CATARACT EXTRACTION PHACO AND INTRAOCULAR LENS PLACEMENT (IOC) RIGHT;  Surgeon: Nevada Crane, MD;  Location: [facility];  Service: Ophthalmology;  Laterality: Right;  . ESOPHAGOGASTRODUODENOSCOPY (EGD) WITH PROPOFOL N/A 10/31/2016   Procedure: ESOPHAGOGASTRODUODENOSCOPY (EGD) WITH PROPOFOL;  Surgeon: Christena Deem, MD;  Location: [facility];  Service: Endoscopy;  Laterality: N/A;  . I & D EXTREMITY Right 11/29/2021   Procedure: IRRIGATION AND DEBRIDEMENT EXTREMITY;  Surgeon: Henrene Dodge, MD;  Location: [facility] OR[facility];  Service: General;  Laterality: Right;  . JOINT REPLACEMENT Right 2003   knee  . knee meniscus repair Right   . LEG WOUND  REPAIR / CLOSURE  11/2021  . MELANOMA EXCISION Right    ear. Followed by Dr. Gwen Pounds  . PATELLECTOMY Bilateral   . TONSILLECTOMY      Family History  Problem Relation Age of Onset  . Hypertension Mother   . Heart disease Mother        CHF  . Heart disease Father   . Cancer Sister        melanoma    Allergies  Allergen Reactions  . Penicillins Rash and Other (See Comments)  Other reaction(s): Other (see comments) Other reaction(s): UNKNOWN  . Benzonatate     Pruritic rash  . Lexapro [Escitalopram] Other (See Comments)    Increased hand tremor  . Molnupiravir     Pruritic rash on neck        Latest Ref Rng & Units 06/21/2022    5:12 PM 05/03/2022   10:43 AM 01/04/2022   11:57 AM  CBC  WBC 4.0 - 10.5 K/uL 6.8  5.4  6.6   Hemoglobin 13.0 - 17.0 g/dL 40.9  81.1  91.4   Hematocrit 39.0 - 52.0 % 42.2  45.7  38.5   Platelets 150 - 400 K/uL 217  254.0  275.0       CMP     Component Value Date/Time   NA 140 06/21/2022 1712   K 4.8 06/21/2022 1712   CL 105 06/21/2022 1712   CO2 29 06/21/2022 1712   GLUCOSE 96 06/21/2022 1712   BUN 35 (H) 06/21/2022 1712   CREATININE 1.22 06/21/2022 1712   CREATININE 0.92 02/03/2015 1504   CALCIUM 8.9 06/21/2022 1712   PROT 6.3 05/03/2022 1043   ALBUMIN 4.2 05/03/2022 1043   AST 16 05/03/2022 1043   ALT 16 05/03/2022 1043   ALKPHOS 43 05/03/2022 1043   BILITOT 1.0 05/03/2022 1043   GFR 50.55 (L) 05/03/2022 1043   GFRNONAA 56 (L) 06/21/2022 1712   GFRNONAA 77 02/03/2015 1504     No results found.     Assessment & Plan:   1. PAD (peripheral artery disease) (HCC) ***  2. Dyslipidemia Continue statin as ordered and reviewed, no changes at this time  3. Non-healing ulcer of lower leg, left, with unspecified severity (HCC) Lower extremity wound is fully healed at this time  4. Chronic obstructive pulmonary disease, unspecified COPD type (HCC) This also likely contributes to the patient's overall fatigue.  Patient to  continue medications as prescribed   Current Outpatient Medications on File Prior to Visit  Medication Sig Dispense Refill  . ALPRAZolam (XANAX) 0.25 MG tablet Take 1 tablet (0.25 mg total) by mouth 2 (two) times daily as needed for anxiety or sleep. 60 tablet 2  . ascorbic acid (VITAMIN C) 500 MG tablet Take 500 mg by mouth daily.    [name] buPROPion (WELLBUTRIN SR) 150 MG 12 hr tablet Take 1 tablet (150 mg total) by mouth in the morning. 90 tablet 1  . Cholecalciferol (VITAMIN D3) 50 MCG (2000 UT) TABS Take 1 tablet by mouth daily.    . cycloSPORINE (RESTASIS) 0.05 % ophthalmic emulsion Place 1 drop into both eyes 2 (two) times daily.    [name] ezetimibe (ZETIA) 10 MG tablet TAKE 1 TABLET BY MOUTH EVERY DAY 60 tablet 0  . levothyroxine (SYNTHROID) 100 MCG tablet TAKE 1 TABLET BY MOUTH EVERY DAY BEFORE BREAKFAST 90 tablet 3  . memantine (NAMENDA) 5 MG tablet Take 5 mg by mouth 2 (two) times daily.    . mometasone (ELOCON) 0.1 % cream Place in external ear canal as needed for itching 15 g 1  . OVER THE COUNTER MEDICATION Folate 1360 mcg 1 Daily    . rosuvastatin (CRESTOR) 10 MG tablet Take 1 tablet (10 mg total) by mouth every other day. 45 tablet 3  . Saccharomyces boulardii (PROBIOTIC) 250 MG CAPS Take 1 capsule by mouth daily at 12 noon.    . sertraline (ZOLOFT) 50 MG tablet Take 1.5 tablets (75 mg total) by mouth daily. 135 tablet 1  . testosterone cypionate (DEPOTESTOSTERONE  CYPIONATE) 200 MG/ML injection INJECT 0.5 ML (100 MG TOTAL) INTO THE MUSCLE EVERY 14 DAYS. 1 mL 2  . vitamin B-12 (CYANOCOBALAMIN) 500 MCG tablet Take 1 tablet by mouth daily.    [name] ZINC GLUCONATE PO Take 80 mg by mouth daily.    . fluticasone (FLONASE) 50 MCG/ACT nasal spray SPRAY 2 SPRAYS INTO EACH NOSTRIL EVERY DAY (Patient not taking: Reported on 09/26/2022) 48 mL 1  . Iodine Strong, Lugols, (IODINE STRONG PO) Take 225 mg by mouth daily. (Patient not taking: Reported on 09/26/2022)    . Iodine, Kelp, (KELP PO) Take 56 mg by  mouth daily. (Patient not taking: Reported on 09/26/2022)     No current facility-administered medications on file prior to visit.    There are no Patient Instructions on file for this visit. No follow-ups on file.   Georgiana Spinner, NP

## 2022-10-12 DIAGNOSIS — R2681 Unsteadiness on feet: Secondary | ICD-10-CM | POA: Diagnosis not present

## 2022-10-12 DIAGNOSIS — R296 Repeated falls: Secondary | ICD-10-CM | POA: Diagnosis not present

## 2022-10-12 DIAGNOSIS — M6281 Muscle weakness (generalized): Secondary | ICD-10-CM | POA: Diagnosis not present

## 2022-10-12 DIAGNOSIS — R2689 Other abnormalities of gait and mobility: Secondary | ICD-10-CM | POA: Diagnosis not present

## 2022-10-12 DIAGNOSIS — R1312 Dysphagia, oropharyngeal phase: Secondary | ICD-10-CM | POA: Diagnosis not present

## 2022-10-12 DIAGNOSIS — F800 Phonological disorder: Secondary | ICD-10-CM | POA: Diagnosis not present

## 2022-10-13 ENCOUNTER — Other Ambulatory Visit: Payer: PPO

## 2022-10-13 DIAGNOSIS — R972 Elevated prostate specific antigen [PSA]: Secondary | ICD-10-CM | POA: Diagnosis not present

## 2022-10-18 DIAGNOSIS — N182 Chronic kidney disease, stage 2 (mild): Secondary | ICD-10-CM | POA: Diagnosis not present

## 2022-10-18 LAB — MICROALBUMIN, URINE: Microalb, Ur: 0.4

## 2022-10-18 LAB — MICROALBUMIN / CREATININE URINE RATIO: Microalb Creat Ratio: 4

## 2022-10-18 LAB — PROTEIN / CREATININE RATIO, URINE: Creatinine, Urine: 102

## 2022-10-19 ENCOUNTER — Ambulatory Visit: Payer: PPO | Admitting: Urology

## 2022-10-24 ENCOUNTER — Other Ambulatory Visit: Payer: Self-pay | Admitting: Internal Medicine

## 2022-10-25 DIAGNOSIS — R319 Hematuria, unspecified: Secondary | ICD-10-CM | POA: Diagnosis not present

## 2022-10-25 DIAGNOSIS — I129 Hypertensive chronic kidney disease with stage 1 through stage 4 chronic kidney disease, or unspecified chronic kidney disease: Secondary | ICD-10-CM | POA: Diagnosis not present

## 2022-10-25 DIAGNOSIS — N1832 Chronic kidney disease, stage 3b: Secondary | ICD-10-CM | POA: Diagnosis not present

## 2022-10-25 DIAGNOSIS — N2581 Secondary hyperparathyroidism of renal origin: Secondary | ICD-10-CM | POA: Insufficient documentation

## 2022-10-26 ENCOUNTER — Other Ambulatory Visit: Payer: Self-pay | Admitting: Cardiovascular Disease

## 2022-10-26 ENCOUNTER — Encounter (INDEPENDENT_AMBULATORY_CARE_PROVIDER_SITE_OTHER): Payer: Self-pay

## 2022-10-27 ENCOUNTER — Encounter: Payer: Self-pay | Admitting: Internal Medicine

## 2022-11-03 ENCOUNTER — Encounter: Payer: Self-pay | Admitting: Internal Medicine

## 2022-11-03 MED ORDER — ALPRAZOLAM 0.25 MG PO TABS: 0.2500 mg | ORAL_TABLET | Freq: Every evening | ORAL | 3 refills | Status: DC | PRN

## 2022-11-03 NOTE — Telephone Encounter (Signed)
Xanax 0.25 ,mg   Lov:09/06/22  Nov: 12/07/22  Disp   Refills  Start          End  60 tablet  2  03/28/2022

## 2022-11-07 DIAGNOSIS — R2689 Other abnormalities of gait and mobility: Secondary | ICD-10-CM | POA: Diagnosis not present

## 2022-11-07 DIAGNOSIS — R29898 Other symptoms and signs involving the musculoskeletal system: Secondary | ICD-10-CM | POA: Diagnosis not present

## 2022-11-07 DIAGNOSIS — G250 Essential tremor: Secondary | ICD-10-CM | POA: Diagnosis not present

## 2022-11-07 DIAGNOSIS — R296 Repeated falls: Secondary | ICD-10-CM | POA: Diagnosis not present

## 2022-11-07 DIAGNOSIS — R4189 Other symptoms and signs involving cognitive functions and awareness: Secondary | ICD-10-CM | POA: Diagnosis not present

## 2022-11-14 DIAGNOSIS — R1312 Dysphagia, oropharyngeal phase: Secondary | ICD-10-CM | POA: Diagnosis not present

## 2022-11-14 DIAGNOSIS — R2689 Other abnormalities of gait and mobility: Secondary | ICD-10-CM | POA: Diagnosis not present

## 2022-11-14 DIAGNOSIS — M6281 Muscle weakness (generalized): Secondary | ICD-10-CM | POA: Diagnosis not present

## 2022-11-14 DIAGNOSIS — R296 Repeated falls: Secondary | ICD-10-CM | POA: Diagnosis not present

## 2022-11-14 DIAGNOSIS — R2681 Unsteadiness on feet: Secondary | ICD-10-CM | POA: Diagnosis not present

## 2022-11-14 DIAGNOSIS — F800 Phonological disorder: Secondary | ICD-10-CM | POA: Diagnosis not present

## 2022-11-15 ENCOUNTER — Encounter: Payer: Self-pay | Admitting: Urology

## 2022-11-15 ENCOUNTER — Ambulatory Visit: Payer: PPO | Admitting: Urology

## 2022-11-15 VITALS — BP 111/59 | HR 60 | Ht 75.0 in | Wt 160.6 lb

## 2022-11-15 DIAGNOSIS — R972 Elevated prostate specific antigen [PSA]: Secondary | ICD-10-CM | POA: Diagnosis not present

## 2022-11-15 NOTE — Progress Notes (Signed)
   11/15/2022 2:37 PM   Theodore [name] 20-Mar-1929 [id number]  Reason for visit: Follow up elevated PSA, low testosterone  HPI: 89 year old male who I originally saw in September 2020 for an elevated PSA of 9.77 which had increased from 3.65 in 2016 and 2.1 in 2008.  I recommended follow-up with repeat PSA in 2 to 3 months, but he did not follow-up.  He has been on testosterone long-term over 10 years via PCP.  His PCP repeated PSA which had jumped up to 16.2 in July 2022.  PSA was repeated in August and remained elevated at 14.8.  At that point we stopped his testosterone for 1 month repeat PSA on 11/22/2020 remained elevated at 14.5.  DRE at our prior visit was benign.  Using shared decision making at our last visit he deferred biopsy and opted for a repeat PSA in 6 months  PSA continued to rise, including 21.7 in December 2022, and 20.1 on 05/17/2021.  PSA doubling time is 2.7 years  We had a long conversation at our last visit about likelihood of prostate cancer, but with his age and comorbidities, he opted to defer biopsy or further imaging at this time.  He preferred more of a watchful waiting type approach.  PSA has stabilized, most recently 22 from August 2024 which is relatively stable from 17.15 January 2022, 19.13 November 2021, and 20.1 in March 2023  We discussed this PSA elevation likely still represents prostate cancer, but with his age and comorbidities watchful waiting continues to be a reasonable option.  Using shared decision making he again opts to defer biopsy or further imaging and pursue watchful waiting.  Return precautions discussed at length, recommended follow-up in 1 year for repeat PSA, if significant increase in the PSA recommend cross-sectional imaging with CT abdomen and pelvis with contrast  RTC 1 year PSA prior-patient prefers more of a watchful waiting approach for suspected prostate cancer   Sondra Come, MD  [facility] [address], [address] [place], [place] [address] [phone]

## 2022-11-16 ENCOUNTER — Inpatient Hospital Stay: Payer: PPO | Admitting: Infectious Diseases

## 2022-11-16 ENCOUNTER — Ambulatory Visit: Payer: PPO | Admitting: Urology

## 2022-11-20 ENCOUNTER — Encounter: Payer: Self-pay | Admitting: Dermatology

## 2022-11-20 ENCOUNTER — Ambulatory Visit: Payer: PPO | Admitting: Dermatology

## 2022-11-20 VITALS — BP 113/64 | HR 68

## 2022-11-20 DIAGNOSIS — L570 Actinic keratosis: Secondary | ICD-10-CM

## 2022-11-20 DIAGNOSIS — W908XXA Exposure to other nonionizing radiation, initial encounter: Secondary | ICD-10-CM | POA: Diagnosis not present

## 2022-11-20 DIAGNOSIS — L814 Other melanin hyperpigmentation: Secondary | ICD-10-CM

## 2022-11-20 DIAGNOSIS — Z86006 Personal history of melanoma in-situ: Secondary | ICD-10-CM

## 2022-11-20 DIAGNOSIS — L578 Other skin changes due to chronic exposure to nonionizing radiation: Secondary | ICD-10-CM | POA: Diagnosis not present

## 2022-11-20 DIAGNOSIS — L821 Other seborrheic keratosis: Secondary | ICD-10-CM | POA: Diagnosis not present

## 2022-11-20 DIAGNOSIS — Z86018 Personal history of other benign neoplasm: Secondary | ICD-10-CM

## 2022-11-20 DIAGNOSIS — Z85820 Personal history of malignant melanoma of skin: Secondary | ICD-10-CM | POA: Diagnosis not present

## 2022-11-20 DIAGNOSIS — D229 Melanocytic nevi, unspecified: Secondary | ICD-10-CM | POA: Diagnosis not present

## 2022-11-20 DIAGNOSIS — D1801 Hemangioma of skin and subcutaneous tissue: Secondary | ICD-10-CM

## 2022-11-20 DIAGNOSIS — D225 Melanocytic nevi of trunk: Secondary | ICD-10-CM | POA: Diagnosis not present

## 2022-11-20 DIAGNOSIS — Z1283 Encounter for screening for malignant neoplasm of skin: Secondary | ICD-10-CM | POA: Diagnosis not present

## 2022-11-20 NOTE — Patient Instructions (Addendum)

## 2022-11-20 NOTE — Progress Notes (Signed)
Follow-Up Visit   Subjective  Theodore [name] is a 89 y.o. male who presents for the following: Skin Cancer Screening and Upper Body Skin Exam  The patient presents for Upper Body Skin Exam (UBSE) for skin cancer screening and mole check. The patient has spots, moles and lesions to be evaluated, some may be new or changing, including right shoulder, right temple, and forehead/scalp area.  He has a history of melanoma.   The following portions of the chart were reviewed this encounter and updated as appropriate: medications, allergies, medical history  Review of Systems:  No other skin or systemic complaints except as noted in HPI or Assessment and Plan.  Objective  Well appearing patient in no apparent distress; mood and affect are within normal limits.  All skin waist up examined. Relevant physical exam findings are noted in the Assessment and Plan.  central upper forehead x 4 (4) Keratotic papules.    Assessment & Plan   Hypertrophic actinic keratosis (4) central upper forehead x 4  vs Inflamed Sks  Actinic keratoses are precancerous spots that appear secondary to cumulative UV radiation exposure/sun exposure over time. They are chronic with expected duration over 1 year. A portion of actinic keratoses will progress to squamous cell carcinoma of the skin. It is not possible to reliably predict which spots will progress to skin cancer and so treatment is recommended to prevent development of skin cancer.  Recommend daily broad spectrum sunscreen SPF 30+ to sun-exposed areas, reapply every 2 hours as needed.  Recommend staying in the shade or wearing long sleeves, sun glasses (UVA+UVB protection) and wide brim hats (4-inch brim around the entire circumference of the hat). Call for new or changing lesions.  Destruction of lesion - central upper forehead x 4 (4)  Destruction method: cryotherapy   Informed consent: discussed and consent obtained   Lesion destroyed using liquid  nitrogen: Yes   Region frozen until ice ball extended beyond lesion: Yes   Outcome: patient tolerated procedure well with no complications   Post-procedure details: wound care instructions given   Additional details:  Prior to procedure, discussed risks of blister formation, small wound, skin dyspigmentation, or rare scar following cryotherapy. Recommend Vaseline ointment to treated areas while healing.    Skin cancer screening performed today.  Actinic Damage - Chronic condition, secondary to cumulative UV/sun exposure - diffuse scaly erythematous macules with underlying dyspigmentation - Recommend daily broad spectrum sunscreen SPF 30+ to sun-exposed areas, reapply every 2 hours as needed.  - Staying in the shade or wearing long sleeves, sun glasses (UVA+UVB protection) and wide brim hats (4-inch brim around the entire circumference of the hat) are also recommended for sun protection.  - Call for new or changing lesions.  Lentigines,  Hemangiomas - Benign normal skin lesions - Benign-appearing - Call for any changes  SEBORRHEIC KERATOSIS - Stuck-on, waxy, tan-brown papules and/or plaques, including right shoulder x 2 and right temple  - Benign-appearing - Discussed benign etiology and prognosis. - Observe - Call for any changes  Melanocytic Nevi - Tan-brown and/or pink-flesh-colored symmetric macules and papules - Right upper abdomen  4 mm medium brown macule, stable - Benign appearing on exam today - Observation - Call clinic for new or changing moles - Recommend daily use of broad spectrum spf 30+ sunscreen to sun-exposed areas.   History of Melanoma - treated in the 90s - No evidence of recurrence today of right ear - Recommend regular full body skin exams - Recommend  daily broad spectrum sunscreen SPF 30+ to sun-exposed areas, reapply every 2 hours as needed.  - Call if any new or changing lesions are noted between office visits   History of Melanoma in Situ - 2015 -  No evidence of recurrence today of left jaw - Recommend regular full body skin exams - Recommend daily broad spectrum sunscreen SPF 30+ to sun-exposed areas, reapply every 2 hours as needed.  - Call if any new or changing lesions are noted between office visits  History of Dysplastic Nevi Left lateral neck, L ant shoulder, R lower sternum - No evidence of recurrence today - Recommend regular full body skin exams - Recommend daily broad spectrum sunscreen SPF 30+ to sun-exposed areas, reapply every 2 hours as needed.  - Call if any new or changing lesions are noted between office visits  Return in about 6 months (around 05/20/2023) for Hx melanoma, Hx Dysplastic Nevus, Hx AKs, TBSE.  ICherlyn Labella, CMA, am acting as scribe for Willeen Niece, MD .   Documentation: I have reviewed the above documentation for accuracy and completeness, and I agree with the above.  Willeen Niece, MD

## 2022-11-21 DIAGNOSIS — M65332 Trigger finger, left middle finger: Secondary | ICD-10-CM | POA: Diagnosis not present

## 2022-11-21 DIAGNOSIS — M65342 Trigger finger, left ring finger: Secondary | ICD-10-CM | POA: Diagnosis not present

## 2022-12-07 ENCOUNTER — Encounter: Payer: Self-pay | Admitting: Internal Medicine

## 2022-12-07 ENCOUNTER — Ambulatory Visit: Payer: PPO | Admitting: Internal Medicine

## 2022-12-07 VITALS — BP 104/52 | HR 55 | Temp 97.4°F | Ht 75.0 in | Wt 162.4 lb

## 2022-12-07 DIAGNOSIS — F0283 Dementia in other diseases classified elsewhere, unspecified severity, with mood disturbance: Secondary | ICD-10-CM | POA: Diagnosis not present

## 2022-12-07 DIAGNOSIS — G309 Alzheimer's disease, unspecified: Secondary | ICD-10-CM

## 2022-12-07 DIAGNOSIS — I69991 Dysphagia following unspecified cerebrovascular disease: Secondary | ICD-10-CM

## 2022-12-07 DIAGNOSIS — F411 Generalized anxiety disorder: Secondary | ICD-10-CM | POA: Diagnosis not present

## 2022-12-07 DIAGNOSIS — R7989 Other specified abnormal findings of blood chemistry: Secondary | ICD-10-CM

## 2022-12-07 DIAGNOSIS — Q819 Epidermolysis bullosa, unspecified: Secondary | ICD-10-CM

## 2022-12-07 MED ORDER — TESTOSTERONE CYPIONATE 200 MG/ML IM SOLN: 100.0000 mg | INTRAMUSCULAR | 2 refills | Status: DC

## 2022-12-07 MED ORDER — MIRTAZAPINE 7.5 MG PO TABS: 7.5000 mg | ORAL_TABLET | Freq: Every day | ORAL | 0 refills | Status: DC

## 2022-12-07 MED ORDER — BUPROPION HCL ER (SR) 150 MG PO TB12: 150.0000 mg | ORAL_TABLET | Freq: Every morning | ORAL | 1 refills | Status: DC

## 2022-12-07 NOTE — Progress Notes (Signed)
Subjective:  Patient ID: Theodore [name], male    DOB: [birth year]  Age: 89 y.o. MRN: [id number]  CC: The primary encounter diagnosis was Low serum testosterone level. 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   HPI Theodore JOHANNESSEN presents for  Chief Complaint  Patient presents with   Medical Management of Chronic Issues    Follow up on depression, anxiety and unintentional weight loss   1) underweight:  reviewed weight trajectory which is improving.  3 meals daily and snacks . [name] notes normal sized meals. .  Sleeps until 11 am most days.  Lots of cookies and ice cream.  Home weights range fro m151 to 155   2) referred to GI  by neurology. .   has chronic constipation despite increasing his water intake to 48 ounces,  taking stool softenters and dulcolax ,  has a bm every 4 days.  Has started a new supplement (ordered online ) one week ago and the constipation has improved.    3) anxiety ,  aggravated by his physical and cognitive decline. Fears eventual placement in Healthcare (T/L nursing facility  Taking sertraline.  Adding remeron today  takes wellbutrin in the morning only.  Using alprazolam to manage insomnia and nightmares.   4) deconditioning :  has been discharged from PT completed the work ,        Outpatient Medications Prior to Visit  Medication Sig Dispense Refill   ALPRAZolam (XANAX) 0.25 MG tablet Take 1 tablet (0.25 mg total) by mouth at bedtime as needed for anxiety or sleep. 30 tablet 3   ascorbic acid (VITAMIN C) 500 MG tablet Take 500 mg by mouth daily.     Cholecalciferol (VITAMIN D3) 50 MCG (2000 UT) TABS Take 1 tablet by mouth daily.     cycloSPORINE (RESTASIS) 0.05 % ophthalmic emulsion Place 1 drop into both eyes 2 (two) times daily.     ezetimibe (ZETIA) 10 MG  tablet TAKE 1 TABLET BY MOUTH EVERY DAY 60 tablet 6   levothyroxine (SYNTHROID) 100 MCG tablet TAKE 1 TABLET BY MOUTH EVERY DAY BEFORE BREAKFAST 90 tablet 3   memantine (NAMENDA) 5 MG tablet Take 5 mg by mouth 2 (two) times daily.     mometasone (ELOCON) 0.1 % cream Place in external ear canal as needed for itching 15 g 1   Probiotic Product (MISC INTESTINAL FLORA REGULAT) CAPS Take by mouth.     rosuvastatin (CRESTOR) 10 MG tablet Take 1 tablet (10 mg total) by mouth every other day. 45 tablet 3   sertraline (ZOLOFT) 50 MG tablet Take 1.5 tablets (75 mg total) by mouth daily. 135 tablet 1   vitamin B-12 (CYANOCOBALAMIN) 500 MCG tablet Take 1 tablet by mouth daily.     ZINC GLUCONATE PO Take 80 mg by mouth daily.     buPROPion (WELLBUTRIN SR) 150 MG 12 hr tablet Take 1 tablet (150 mg total) by mouth in the morning. 90 tablet 1   testosterone cypionate (DEPOTESTOSTERONE CYPIONATE) 200 MG/ML injection INJECT 0.5 ML (100 MG TOTAL) INTO THE MUSCLE EVERY 14 DAYS. 1 mL 2   acetaminophen (TYLENOL) 500 MG tablet Take 500 mg by mouth as needed.     OVER THE COUNTER MEDICATION Folate 1360 mcg 1 Daily     PARoxetine (PAXIL) 10 MG tablet Take 10 mg by  mouth daily.     propranolol ER (INDERAL LA) 60 MG 24 hr capsule Take 1 capsule (60 mg total) by mouth daily. As needed per pt 90 capsule 0   Saccharomyces boulardii (PROBIOTIC) 250 MG CAPS Take 1 capsule by mouth daily at 12 noon.     No facility-administered medications prior to visit.    Review of Systems;  Patient denies headache, fevers, malaise, unintentional weight loss, skin rash, eye pain, sinus congestion and sinus pain, sore throat, dysphagia,  hemoptysis , cough, dyspnea, wheezing, chest pain, palpitations, orthopnea, edema, abdominal pain, nausea, melena, diarrhea, constipation, flank pain, dysuria, hematuria, urinary  Frequency, nocturia, numbness, tingling, seizures,  Focal weakness, Loss of consciousness,  Tremor, insomnia, depression,  anxiety, and suicidal ideation.      Objective:  BP (!) 104/52   Pulse (!) 55   Temp (!) 97.4 F (36.3 C) (Oral)   Ht 6\' 3"  (1.905 m)   Wt 162 lb 6.4 oz (73.7 kg)   SpO2 99%   BMI 20.30 kg/m   BP Readings from Last 3 Encounters:  12/07/22 (!) 104/52  11/20/22 113/64  11/15/22 (!) 111/59    Wt Readings from Last 3 Encounters:  12/07/22 162 lb 6.4 oz (73.7 kg)  11/15/22 160 lb 9.6 oz (72.8 kg)  10/11/22 155 lb (70.3 kg)    Physical Exam Vitals reviewed.  Constitutional:      General: He is not in acute distress.    Appearance: Normal appearance. He is underweight. He is not ill-appearing, toxic-appearing or diaphoretic.  HENT:     Head: Normocephalic.  Eyes:     General: No scleral icterus.       Right eye: No discharge.        Left eye: No discharge.     Conjunctiva/sclera: Conjunctivae normal.  Cardiovascular:     Rate and Rhythm: Normal rate and regular rhythm.     Heart sounds: Normal heart sounds.  Pulmonary:     Effort: Pulmonary effort is normal. No respiratory distress.     Breath sounds: Normal breath sounds.  Musculoskeletal:        General: Normal range of motion.     Cervical back: Normal range of motion.  Skin:    General: Skin is warm and dry.  Neurological:     General: No focal deficit present.     Mental Status: He is alert and oriented to person, place, and time. Mental status is at baseline.  Psychiatric:        Mood and Affect: Mood is anxious.        Speech: Speech is slurred.        Behavior: Behavior normal.        Thought Content: Thought content normal.        Cognition and Memory: Memory is impaired.        Judgment: Judgment normal.    Lab Results  Component Value Date   HGBA1C 4.8 01/17/2021   HGBA1C 4.7 05/28/2018    Lab Results  Component Value Date   CREATININE 1.22 06/21/2022   CREATININE 1.25 05/03/2022   CREATININE 1.19 01/23/2022    Lab Results  Component Value Date   WBC 6.8 06/21/2022   HGB 13.7  06/21/2022   HCT 42.2 06/21/2022   PLT 217 06/21/2022   GLUCOSE 96 06/21/2022   CHOL 197 09/21/2020   TRIG 70.0 09/21/2020   HDL 62.00 09/21/2020   LDLCALC 121 (H) 09/21/2020   ALT 16 05/03/2022  AST 16 05/03/2022   NA 140 06/21/2022   K 4.8 06/21/2022   CL 105 06/21/2022   CREATININE 1.22 06/21/2022   BUN 35 (H) 06/21/2022   CO2 29 06/21/2022   TSH 1.37 05/03/2022   PSA 21.68 (H) 02/11/2021   INR 1.4 (H) 11/29/2021   HGBA1C 4.8 01/17/2021    No results found.  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 Reviewed behavioral modifications to prevent aspiration and choking    Other orders -     buPROPion HCl ER (SR); Take 1 tablet (150 mg total) by mouth in the morning.  Dispense: 90 tablet; Refill: 1 -     Mirtazapine; Take 1 tablet (7.5 mg total) by mouth at bedtime.  Dispense: 30 tablet; Refill: 0 -     Testosterone Cypionate; Inject 0.5 mLs (100 mg total) into the muscle every 14 (fourteen) days.  Dispense: 1 mL; Refill: 2     I provided 38 minutes of face-to-face time during this encounter reviewing patient's last visit with me, patient's  most recent visit with neurology  and cardiology,   previous surgical and non surgical procedures, previous  labs  and imaging studies, counseling on currently addressed issues,  and post visit ordering to diagnostics and therapeutics .   Follow-up: Return in about 4 weeks (around 01/04/2023) for depression ,  weight loss .   [name], MD

## 2022-12-07 NOTE — Patient Instructions (Addendum)
Continue use of sertraline and welllbutrin in the morning with breakfast for anxiety/depression  Change the thyroid  medication to bedtime dosing to simplify things .  Adding  mirtazapine (remeron) at bedtime to improve appetite  ,  sleep and energy,  we can increase the dose  every 4 weeks, faster if you prefer  Testosterone refilled

## 2022-12-07 NOTE — Assessment & Plan Note (Signed)
He prefers to continue testosterone replacement therapy for effect on QOL  despite the likelihood of prostate CA based on continued rise in PSA (followed by [facility]; note reviewed)

## 2022-12-08 NOTE — Assessment & Plan Note (Addendum)
He has been referred to Speech Therapy at [facility] , which has ended/ . Reviewed behavioral modifications to prevent aspiration and choking

## 2022-12-08 NOTE — Assessment & Plan Note (Signed)
Continue zoloft and alprazolam.

## 2022-12-08 NOTE — Assessment & Plan Note (Addendum)
Coninue Nemantine and wellbutrin once daily for low energy.  Adding remeron at bedtime for appetite stimulation

## 2022-12-13 DIAGNOSIS — R2689 Other abnormalities of gait and mobility: Secondary | ICD-10-CM | POA: Diagnosis not present

## 2022-12-13 DIAGNOSIS — E440 Moderate protein-calorie malnutrition: Secondary | ICD-10-CM | POA: Diagnosis not present

## 2022-12-13 DIAGNOSIS — R4189 Other symptoms and signs involving cognitive functions and awareness: Secondary | ICD-10-CM | POA: Diagnosis not present

## 2022-12-13 DIAGNOSIS — R1312 Dysphagia, oropharyngeal phase: Secondary | ICD-10-CM | POA: Diagnosis not present

## 2022-12-13 DIAGNOSIS — F800 Phonological disorder: Secondary | ICD-10-CM | POA: Diagnosis not present

## 2022-12-13 DIAGNOSIS — R2681 Unsteadiness on feet: Secondary | ICD-10-CM | POA: Diagnosis not present

## 2022-12-13 DIAGNOSIS — I878 Other specified disorders of veins: Secondary | ICD-10-CM | POA: Diagnosis not present

## 2022-12-13 DIAGNOSIS — R296 Repeated falls: Secondary | ICD-10-CM | POA: Diagnosis not present

## 2022-12-13 DIAGNOSIS — R634 Abnormal weight loss: Secondary | ICD-10-CM | POA: Diagnosis not present

## 2022-12-13 DIAGNOSIS — K5900 Constipation, unspecified: Secondary | ICD-10-CM | POA: Diagnosis not present

## 2022-12-13 DIAGNOSIS — M6281 Muscle weakness (generalized): Secondary | ICD-10-CM | POA: Diagnosis not present

## 2022-12-14 NOTE — Addendum Note (Signed)
Addended by: [name] on: 12/14/2022 10:11 PM   Modules accepted: Level of Service

## 2022-12-31 ENCOUNTER — Other Ambulatory Visit: Payer: Self-pay | Admitting: Internal Medicine

## 2023-01-04 ENCOUNTER — Ambulatory Visit: Payer: PPO | Admitting: Internal Medicine

## 2023-01-04 ENCOUNTER — Encounter: Payer: Self-pay | Admitting: Internal Medicine

## 2023-01-04 VITALS — BP 127/73 | HR 45 | Temp 97.8°F | Ht 75.0 in | Wt 162.4 lb

## 2023-01-04 DIAGNOSIS — N1832 Chronic kidney disease, stage 3b: Secondary | ICD-10-CM | POA: Diagnosis not present

## 2023-01-04 DIAGNOSIS — E038 Other specified hypothyroidism: Secondary | ICD-10-CM

## 2023-01-04 DIAGNOSIS — I69991 Dysphagia following unspecified cerebrovascular disease: Secondary | ICD-10-CM

## 2023-01-04 DIAGNOSIS — I700 Atherosclerosis of aorta: Secondary | ICD-10-CM | POA: Diagnosis not present

## 2023-01-04 DIAGNOSIS — J449 Chronic obstructive pulmonary disease, unspecified: Secondary | ICD-10-CM | POA: Diagnosis not present

## 2023-01-04 DIAGNOSIS — E440 Moderate protein-calorie malnutrition: Secondary | ICD-10-CM

## 2023-01-04 DIAGNOSIS — F0283 Dementia in other diseases classified elsewhere, unspecified severity, with mood disturbance: Secondary | ICD-10-CM

## 2023-01-04 DIAGNOSIS — G309 Alzheimer's disease, unspecified: Secondary | ICD-10-CM | POA: Diagnosis not present

## 2023-01-04 DIAGNOSIS — E785 Hyperlipidemia, unspecified: Secondary | ICD-10-CM

## 2023-01-04 DIAGNOSIS — F411 Generalized anxiety disorder: Secondary | ICD-10-CM

## 2023-01-04 LAB — HEMOGLOBIN A1C: Hgb A1c MFr Bld: 5 % (ref 4.6–6.5)

## 2023-01-04 LAB — TSH: TSH: 1.02 u[IU]/mL (ref 0.35–5.50)

## 2023-01-04 LAB — LIPID PANEL
Cholesterol: 152 mg/dL (ref 0–200)
HDL: 65.2 mg/dL (ref >39.00)
LDL Cholesterol: 71 mg/dL (ref 0–99)
NonHDL: 86.34
Total CHOL/HDL Ratio: 2
Triglycerides: 78 mg/dL (ref 0.0–149.0)
VLDL: 15.6 mg/dL (ref 0.0–40.0)

## 2023-01-04 LAB — COMPREHENSIVE METABOLIC PANEL
ALT: 24 U/L (ref 0–53)
AST: 19 U/L (ref 0–37)
Albumin: 4.2 g/dL (ref 3.5–5.2)
Alkaline Phosphatase: 50 U/L (ref 39–117)
BUN: 30 mg/dL — ABNORMAL HIGH (ref 6–23)
CO2: 31 meq/L (ref 19–32)
Calcium: 9.1 mg/dL (ref 8.4–10.5)
Chloride: 105 meq/L (ref 96–112)
Creatinine, Ser: 1.29 mg/dL (ref 0.40–1.50)
GFR: 48.45 mL/min — ABNORMAL LOW (ref >60.00)
Glucose, Bld: 87 mg/dL (ref 70–99)
Potassium: 4.9 meq/L (ref 3.5–5.1)
Sodium: 140 meq/L (ref 135–145)
Total Bilirubin: 0.7 mg/dL (ref 0.2–1.2)
Total Protein: 6.1 g/dL (ref 6.0–8.3)

## 2023-01-04 LAB — LDL CHOLESTEROL, DIRECT: Direct LDL: 72 mg/dL

## 2023-01-04 NOTE — Patient Instructions (Addendum)
I agree with stopping Namenda  (memantine)  because it can cause constipation,  slow heart rate  low blood pressure,  dizziness, and headaches  Continue mirtazipine at bedtime to stimulate appetite , but increase the dose to 15 mg to  improve your depression . If ti makes him more sleep,  reduce dose to 7.5 mg   For the rash:   use Cetaphil,  cerave,  aveeno or eucerin   EVERY DAY .  THESE are the best moisturizers to use    Start using Generic allegra or  generic claritin twice  daily,   along with famotidine (generic for Pepcid)   20  twice daily  to manage the [facility]

## 2023-01-04 NOTE — Progress Notes (Signed)
Subjective:  Patient ID: Theodore [name], male    DOB: [birth year]  Age: 89 y.o. MRN: [id number]  CC: The primary encounter diagnosis was Other specified hypothyroidism. 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   HPI LEXUS STENNER presents for  Chief Complaint  Patient presents with   Medical Management of Chronic Issues   One month follow up on multiple symptoms including depression/anxiety aggravated by dementia , and unintentional weight loss.  He is accompanied by his wife [name],    1) Weight loss:   HE HAS been taking  Remeron 7.5 mg at bedtime w since  last visit.  [name] has kept daily weights and reports a gain of  3 lbs since Oct 10.  He feels that his appetite has increased.    sleeping 12 to 13 hours daily   2) Constipation:  He was placed on an aggressive bowel regimen  of twice daily metamucil, miralax and stool softener,  increased water intake and physical  activity,  and a daily bathroom ritual of sitting for 5 minutes . KUB was done but results not available  has follow up in December.  He is now having 2 BMs daily   3) New onset pruritic rash on legs and arms : he has developed scatter puritic papules of various morphologies on his extremities but none on his torso.   started 1-2 weeks ago . Has started using a moisturizer for itching   4) Bradycardia:  sinus,  chronic per review of July 2024 EKG..  more persistent and < 50 at times since Feb 2022    5) Dysphagia: HAD A CHOKING SPELL THIS MORNING WHILE EATING OATMEAL BECAUSE HE WAS EATING TOO FAST.       6) Dementia:  states that he stopped Namenda one week ago for unclear  reasons.   Outpatient Medications Prior to Visit  Medication Sig Dispense Refill   ALPRAZolam (XANAX) 0.25 MG tablet Take 1 tablet (0.25 mg total) by mouth at bedtime as needed for anxiety or sleep. 30 tablet 3   ascorbic acid (VITAMIN C) 500 MG tablet Take 500 mg by mouth daily.     buPROPion (WELLBUTRIN SR) 150 MG 12 hr tablet Take 1 tablet (150 mg total) by mouth in the morning. 90 tablet 1   cycloSPORINE (RESTASIS) 0.05 % ophthalmic emulsion Place 1 drop into both eyes 2 (two) times daily.     ezetimibe (ZETIA) 10 MG tablet TAKE 1 TABLET BY MOUTH EVERY DAY 60 tablet 6   levothyroxine (SYNTHROID) 100 MCG tablet TAKE 1 TABLET BY MOUTH EVERY DAY BEFORE BREAKFAST 90 tablet 3   mirtazapine (REMERON) 7.5 MG tablet TAKE 1 TABLET BY MOUTH AT BEDTIME. 90 tablet 1   mometasone (ELOCON) 0.1 % cream Place in external ear canal as needed for itching 15 g 1   Probiotic Product (MISC INTESTINAL FLORA REGULAT) CAPS Take by mouth.     rosuvastatin (CRESTOR) 10 MG tablet Take 1 tablet (10 mg total) by mouth every other day. 45 tablet 3   sertraline (ZOLOFT) 50 MG tablet Take 1.5 tablets (75 mg total) by mouth daily. 135 tablet 1   testosterone cypionate (DEPOTESTOSTERONE CYPIONATE) 200 MG/ML injection Inject 0.5 mLs (100 mg total) into the  muscle every 14 (fourteen) days. 1 mL 2   vitamin B-12 (CYANOCOBALAMIN) 500 MCG tablet Take 1 tablet by mouth daily.     ZINC GLUCONATE PO Take 80 mg by mouth daily.     Cholecalciferol (VITAMIN D3) 50 MCG (2000 UT) TABS Take 1 tablet by mouth daily.     memantine (NAMENDA) 5 MG tablet Take 5 mg by mouth 2 (two) times daily.     No facility-administered medications prior to visit.    Review of Systems;  Patient denies headache, fevers, malaise, unintentional weight loss, eye pain, sinus congestion and sinus pain, sore throat, dysphagia,  hemoptysis , cough, dyspnea, wheezing, chest pain, palpitations, orthopnea, edema, abdominal pain, nausea, melena, diarrhea,  flank  pain, dysuria, hematuria, urinary  Frequency, nocturia, numbness, tingling, seizures,  Focal weakness, Loss of consciousness,  Tremor, insomnia, and suicidal ideation.      Objective:  BP 127/73   Pulse (!) 45   Temp 97.8 F (36.6 C) (Oral)   Ht 6\' 3"  (1.905 m)   Wt 162 lb 6.4 oz (73.7 kg)   SpO2 97%   BMI 20.30 kg/m   BP Readings from Last 3 Encounters:  01/04/23 127/73  12/07/22 (!) 104/52  11/20/22 113/64    Wt Readings from Last 3 Encounters:  01/04/23 162 lb 6.4 oz (73.7 kg)  12/07/22 162 lb 6.4 oz (73.7 kg)  11/15/22 160 lb 9.6 oz (72.8 kg)    Physical Exam Vitals reviewed.  Constitutional:      General: He is not in acute distress.    Appearance: Normal appearance. He is normal weight. He is not ill-appearing, toxic-appearing or diaphoretic.  HENT:     Head: Normocephalic.  Eyes:     General: No scleral icterus.       Right eye: No discharge.        Left eye: No discharge.     Conjunctiva/sclera: Conjunctivae normal.  Cardiovascular:     Rate and Rhythm: Normal rate and regular rhythm.     Heart sounds: Normal heart sounds.  Pulmonary:     Effort: Pulmonary effort is normal. No respiratory distress.     Breath sounds: Normal breath sounds.  Musculoskeletal:        General: Normal range of motion.     Cervical back: Normal range of motion.  Skin:    General: Skin is warm and dry.  Neurological:     General: No focal deficit present.     Mental Status: He is alert and oriented to person, place, and time. Mental status is at baseline.  Psychiatric:        Mood and Affect: Mood normal.        Behavior: Behavior normal.        Thought Content: Thought content normal.        Judgment: Judgment normal.    Lab Results  Component Value Date   HGBA1C 5.0 01/04/2023   HGBA1C 4.8 01/17/2021   HGBA1C 4.7 05/28/2018    Lab Results  Component Value Date   CREATININE 1.29 01/04/2023   CREATININE 1.22 06/21/2022   CREATININE 1.25 05/03/2022    Lab  Results  Component Value Date   WBC 6.8 06/21/2022   HGB 13.7 06/21/2022   HCT 42.2 06/21/2022   PLT 217 06/21/2022   GLUCOSE 87 01/04/2023   CHOL 152 01/04/2023   TRIG 78.0 01/04/2023   HDL 65.20 01/04/2023   LDLDIRECT 72.0 01/04/2023   LDLCALC 71 01/04/2023   ALT 24  01/04/2023   AST 19 01/04/2023   NA 140 01/04/2023   K 4.9 01/04/2023   CL 105 01/04/2023   CREATININE 1.29 01/04/2023   BUN 30 (H) 01/04/2023   CO2 31 01/04/2023   TSH 1.02 01/04/2023   PSA 21.68 (H) 02/11/2021   INR 1.4 (H) 11/29/2021   HGBA1C 5.0 01/04/2023   MICROALBUR 0.4 10/18/2022    No results found.  Assessment & Plan:  .Other specified hypothyroidism Assessment & Plan: Thyroid function is WNL on current dose.  No current changes needed.    Lab Results  Component Value Date   TSH 1.02 01/04/2023     Orders: -     TSH  Dyslipidemia -     Lipid panel -     LDL cholesterol, direct -     Comprehensive metabolic panel -     Hemoglobin A1c  GAD (generalized anxiety disorder) Assessment & Plan: Aggravated by dementia. Continue zoloft and alprazolam    Protein-calorie malnutrition, moderate (HCC) Assessment & Plan: Weight loss has stopped with addition of Remeron . eating 3 meals daily.  Encouraged to add Ensure or Boost daily    Thoracic aortic atherosclerosis [facility]) Assessment & Plan: Reviewed findings of prior CT scan today..  Patient is tolerating rosuvastatin every other day,  and zetia daily    Dysphagia as late effect of cerebrovascular disease Assessment & Plan:  . Reviewed behavioral modifications to prevent aspiration and choking    Chronic kidney disease, stage 3b (HCC) Assessment & Plan: Managed with avoidance of NSAIDs.  Seeing nephrology.  GFR has improved to 47 ml/min per last check .    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   Follow-up: No follow-ups on file.   [name], MD

## 2023-01-06 NOTE — Assessment & Plan Note (Signed)
Reviewed findings of prior CT scan today..  Patient is tolerating rosuvastatin every other day,  and zetia daily

## 2023-01-06 NOTE — Assessment & Plan Note (Signed)
He reports improvement in his dyspnea and is asymptomatic with activities

## 2023-01-06 NOTE — Assessment & Plan Note (Signed)
Stopping  Nemantine gven multiple adverse side effecsts

## 2023-01-06 NOTE — Assessment & Plan Note (Signed)
Thyroid function is WNL on current dose.  No current changes needed.    Lab Results  Component Value Date   TSH 1.02 01/04/2023

## 2023-01-06 NOTE — Assessment & Plan Note (Signed)
Managed with avoidance of NSAIDs.  Seeing nephrology.  GFR has improved to 47 ml/min per last check .    Lab Results  Component Value Date   CREATININE 1.29 01/04/2023

## 2023-01-06 NOTE — Assessment & Plan Note (Signed)
Aggravated by dementia. Continue zoloft and alprazolam

## 2023-01-06 NOTE — Assessment & Plan Note (Signed)
.   Reviewed behavioral modifications to prevent aspiration and choking

## 2023-01-06 NOTE — Assessment & Plan Note (Signed)
Weight loss has stopped with addition of Remeron . eating 3 meals daily.  Encouraged to add Ensure or Boost daily

## 2023-01-11 ENCOUNTER — Ambulatory Visit: Payer: PPO | Admitting: Podiatry

## 2023-01-11 ENCOUNTER — Encounter: Payer: Self-pay | Admitting: Podiatry

## 2023-01-11 VITALS — Ht 75.0 in | Wt 162.0 lb

## 2023-01-11 DIAGNOSIS — M79674 Pain in right toe(s): Secondary | ICD-10-CM | POA: Diagnosis not present

## 2023-01-11 DIAGNOSIS — M79675 Pain in left toe(s): Secondary | ICD-10-CM | POA: Diagnosis not present

## 2023-01-11 DIAGNOSIS — B351 Tinea unguium: Secondary | ICD-10-CM

## 2023-01-11 NOTE — Progress Notes (Signed)
  Subjective:  Patient ID: Theodore [name], male    DOB: Jun 17, 1929,  MRN: [id number]  Chief Complaint  Patient presents with   Foot Pain    Rm6: feet are gray and scaly , nail trim   89 y.o. male returns for the above complaint.  Patient presents with complaint of thickened elongated dystrophic mycotic toenails x 10 mild pain on palpation worse with ambulation worse with pressure patient would like me debride down he is not able to do it himself  Objective:  There were no vitals filed for this visit. Podiatric Exam: Vascular: dorsalis pedis and posterior tibial pulses are palpable bilateral. Capillary return is immediate. Temperature gradient is WNL. Skin turgor WNL  Sensorium: Normal Semmes Weinstein monofilament test. Normal tactile sensation bilaterally. Nail Exam: Pt has thick disfigured discolored nails with subungual debris noted bilateral entire nail hallux through fifth toenails.  Pain on palpation to the nails. Ulcer Exam: There is no evidence of ulcer or pre-ulcerative changes or infection. Orthopedic Exam: Muscle tone and strength are WNL. No limitations in general ROM. No crepitus or effusions noted.  Skin: No Porokeratosis. No infection or ulcers    Assessment & Plan:   1. Pain due to onychomycosis of toenails of both feet     Patient was evaluated and treated and all questions answered.  Onychomycosis with pain  -Nails palliatively debrided as below. -Educated on self-care  Procedure: Nail Debridement Rationale: pain  Type of Debridement: manual, sharp debridement. Instrumentation: Nail nipper, rotary burr. Number of Nails: 10  Procedures and Treatment: Consent by patient was obtained for treatment procedures. The patient understood the discussion of treatment and procedures well. All questions were answered thoroughly reviewed. Debridement of mycotic and hypertrophic toenails, 1 through 5 bilateral and clearing of subungual debris. No ulceration, no infection  noted.  Return Visit-Office Procedure: Patient instructed to return to the office for a follow up visit 3 months for continued evaluation and treatment.  Nicholes Rough, DPM    No follow-ups on file.

## 2023-01-16 DIAGNOSIS — R4189 Other symptoms and signs involving cognitive functions and awareness: Secondary | ICD-10-CM | POA: Diagnosis not present

## 2023-01-16 DIAGNOSIS — G250 Essential tremor: Secondary | ICD-10-CM | POA: Diagnosis not present

## 2023-01-16 DIAGNOSIS — G4710 Hypersomnia, unspecified: Secondary | ICD-10-CM | POA: Diagnosis not present

## 2023-01-16 DIAGNOSIS — R29898 Other symptoms and signs involving the musculoskeletal system: Secondary | ICD-10-CM | POA: Diagnosis not present

## 2023-01-16 DIAGNOSIS — R634 Abnormal weight loss: Secondary | ICD-10-CM | POA: Diagnosis not present

## 2023-01-30 DIAGNOSIS — Z681 Body mass index (BMI) 19 or less, adult: Secondary | ICD-10-CM | POA: Diagnosis not present

## 2023-01-30 DIAGNOSIS — F03911 Unspecified dementia, unspecified severity, with agitation: Secondary | ICD-10-CM | POA: Diagnosis not present

## 2023-01-30 DIAGNOSIS — E441 Mild protein-calorie malnutrition: Secondary | ICD-10-CM | POA: Diagnosis not present

## 2023-01-30 DIAGNOSIS — F0394 Unspecified dementia, unspecified severity, with anxiety: Secondary | ICD-10-CM | POA: Diagnosis not present

## 2023-01-30 DIAGNOSIS — D692 Other nonthrombocytopenic purpura: Secondary | ICD-10-CM | POA: Diagnosis not present

## 2023-01-30 DIAGNOSIS — F330 Major depressive disorder, recurrent, mild: Secondary | ICD-10-CM | POA: Diagnosis not present

## 2023-01-30 DIAGNOSIS — F0393 Unspecified dementia, unspecified severity, with mood disturbance: Secondary | ICD-10-CM | POA: Diagnosis not present

## 2023-01-30 DIAGNOSIS — Z66 Do not resuscitate: Secondary | ICD-10-CM | POA: Diagnosis not present

## 2023-01-30 DIAGNOSIS — Z515 Encounter for palliative care: Secondary | ICD-10-CM | POA: Diagnosis not present

## 2023-01-31 DIAGNOSIS — H04123 Dry eye syndrome of bilateral lacrimal glands: Secondary | ICD-10-CM | POA: Diagnosis not present

## 2023-01-31 DIAGNOSIS — H109 Unspecified conjunctivitis: Secondary | ICD-10-CM | POA: Diagnosis not present

## 2023-01-31 DIAGNOSIS — H01003 Unspecified blepharitis right eye, unspecified eyelid: Secondary | ICD-10-CM | POA: Diagnosis not present

## 2023-02-01 DIAGNOSIS — H6123 Impacted cerumen, bilateral: Secondary | ICD-10-CM | POA: Diagnosis not present

## 2023-02-01 DIAGNOSIS — H6063 Unspecified chronic otitis externa, bilateral: Secondary | ICD-10-CM | POA: Diagnosis not present

## 2023-02-01 DIAGNOSIS — H903 Sensorineural hearing loss, bilateral: Secondary | ICD-10-CM | POA: Diagnosis not present

## 2023-02-02 ENCOUNTER — Ambulatory Visit: Payer: PPO

## 2023-02-02 ENCOUNTER — Encounter: Payer: Self-pay | Admitting: Podiatry

## 2023-02-02 ENCOUNTER — Ambulatory Visit: Payer: PPO | Admitting: Podiatry

## 2023-02-02 ENCOUNTER — Encounter: Payer: Self-pay | Admitting: Internal Medicine

## 2023-02-02 VITALS — Ht 75.0 in | Wt 162.0 lb

## 2023-02-02 DIAGNOSIS — L97522 Non-pressure chronic ulcer of other part of left foot with fat layer exposed: Secondary | ICD-10-CM

## 2023-02-02 DIAGNOSIS — B351 Tinea unguium: Secondary | ICD-10-CM

## 2023-02-02 DIAGNOSIS — M79674 Pain in right toe(s): Secondary | ICD-10-CM

## 2023-02-02 MED ORDER — GENTAMICIN SULFATE 0.1 % EX CREA: 1.0000 | TOPICAL_CREAM | Freq: Two times a day (BID) | CUTANEOUS | 1 refills | Status: DC

## 2023-02-02 MED ORDER — DOXYCYCLINE HYCLATE 100 MG PO TABS: 100.0000 mg | ORAL_TABLET | Freq: Two times a day (BID) | ORAL | 0 refills | Status: DC

## 2023-02-02 NOTE — Progress Notes (Signed)
Chief Complaint  Patient presents with   Toe Pain    PT is here due to injuring 3rd left toe a week ago, pt states it was fine until 3 days ago, shooting pain in toe that goes up to top of foot.    HPI: 89 y.o. male presenting today as a new patient with his wife for evaluation of redness with swelling to the left third toe.  Patient believes that he stubbed his toe about 1 week ago.  He has had increased pain and tenderness for the last few days.  He also says that he has had a superficial wound overlying the tip of the toe for a few weeks now.  They have not been anything for treatment  Past Medical History:  Diagnosis Date   Actinic keratosis    Arthritis    knees   Asymptomatic Sinus bradycardia    a. 05/2016 Zio: Avg HR 61 (41-167).   Benign essential tremor    Cancer (HCC) 2002   melanoma right ear   Colon polyp    COPD (chronic obstructive pulmonary disease) (HCC)    pt said he believes it is a misdiagnosis    Coronary artery calcification seen on CT scan    a. 06/2016 CTA Chest: cor Ca2+; b. 05/2017 MV: EF >65%. No ischemia/infarct.   COVID-19 virus infection 04/06/2020   Depression    Dysplastic nevus 04/25/2022   Left lateral neck, severe, shave removal 06/05/22   History of echocardiogram    a. 04/2016 Echo: EF 60-65%, mild conc LVH. Nl PASP.   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 05/2016 Zio: Avg HR 61 (41-167). 9 SVT runs (fastest 167 - 5 beats; longest 8 beats - 101 bpm). Rare PACs/PVCs.   Pre-syncope    a. 06/2022 Zio: Predominantly sinus bradycardia with an average heart rate of 53 (40-148).  1 run of NSVT x 17 beats (143  bpm), 19 SVT runs (fastest 148 x 7 beats; longest 11 beats @ 114 bpm).  Rare PVCs/PACs. Triggered events assoc w/ sinus rhythym.   PSVT (paroxysmal supraventricular tachycardia) (HCC)    a. 05/2016 Zio: 9 SVT runs; b. 06/2022 Zio: 19 SVT runs, fastest 148, longest 11 beats.   PVC's (premature ventricular contractions)    Rhinitis    Sepsis due to cellulitis (HCC) 11/28/2021   Severe sepsis (HCC) 11/28/2021   Wears dentures    partial upper and lower   Wears hearing aid in both ears     Past Surgical History:  Procedure Laterality Date   ADENOIDECTOMY     CATARACT EXTRACTION W/PHACO Left 10/22/2017   Procedure: CATARACT EXTRACTION PHACO AND INTRAOCULAR LENS PLACEMENT (IOC)  LEFT;  Surgeon: Nevada Crane, MD;  Location: [facility];  Service: Ophthalmology;  Laterality: Left;   CATARACT EXTRACTION W/PHACO Right 11/20/2017   Procedure: CATARACT EXTRACTION PHACO AND INTRAOCULAR LENS PLACEMENT (IOC) RIGHT;  Surgeon: Nevada Crane, MD;  Location: [facility];  Service: Ophthalmology;  Laterality: Right;   ESOPHAGOGASTRODUODENOSCOPY (EGD) WITH PROPOFOL N/A 10/31/2016  Procedure: ESOPHAGOGASTRODUODENOSCOPY (EGD) WITH PROPOFOL;  Surgeon: Christena Deem, MD;  Location: [facility] ENDOSCOPY;  Service: Endoscopy;  Laterality: N/A;   I & D EXTREMITY Right 11/29/2021   Procedure: IRRIGATION AND DEBRIDEMENT EXTREMITY;  Surgeon: Henrene Dodge, MD;  Location: [facility] ORS;  Service: General;  Laterality: Right;   JOINT REPLACEMENT Right 2003   knee   knee meniscus repair Right    LEG WOUND REPAIR / CLOSURE  11/2021   MELANOMA EXCISION Right    ear. Followed by Dr. Gwen Pounds   PATELLECTOMY Bilateral    TONSILLECTOMY      Allergies  Allergen Reactions   Penicillins Rash and Other (See Comments)    Other reaction(s): Other (see comments) Other reaction(s): UNKNOWN   Benzonatate     Pruritic rash   Lexapro [Escitalopram] Other (See Comments)    Increased hand tremor    Molnupiravir     Pruritic rash on neck     LT toes 02/02/2023  Physical Exam: General: The patient is alert and oriented x3 in no acute distress.  Dermatology: Superficial ulcer noted overlying the DIPJ of the left third toe.  It appears stable.  There is some surrounding erythema around the toe extending into the dorsum of the forefoot  Vascular: Palpable pedal pulses bilaterally. Capillary refill within normal limits.    Neurological: Grossly intact via light touch  Musculoskeletal Exam: Hammertoe contracture noted to the lesser digits  Radiographic Exam LT foot 02/02/2023:  Chronic degenerative changes noted throughout the joints of the foot.  Hammertoe contracture noted also to the lesser digits.  No obvious indication of erosions that would be concerning for osteomyelitis.  No gas within the tissue  Assessment/Plan of Care: 1.  Ulcer overlying the DIPJ of the left third toe with mild cellulitis 2.  Hammertoe contracture lesser digits  -Patient evaluated.  X-rays reviewed -Prescription for doxycycline 100 mg BID x 10 days -Prescription for gentamicin cream to apply to the superficial wound twice daily -Continue Brooks running shoes.  These do not seem to rub or irritate the toes -Return to clinic 2 weeks     Felecia Shelling, DPM [facility]  Dr. Felecia Shelling, DPM    [address] [place], [place] [address]                Office [phone]  Fax [phone]

## 2023-02-06 MED ORDER — TESTOSTERONE 50 MG/5GM (1%) TD GEL: 5.0000 g | Freq: Every day | TRANSDERMAL | 5 refills | Status: DC

## 2023-02-20 ENCOUNTER — Ambulatory Visit: Payer: PPO | Admitting: Podiatry

## 2023-02-20 DIAGNOSIS — L97522 Non-pressure chronic ulcer of other part of left foot with fat layer exposed: Secondary | ICD-10-CM

## 2023-02-20 NOTE — Progress Notes (Signed)
No chief complaint on file.   HPI: 89 y.o. male presenting today for follow-up evaluation of an ulcer to the left third toe.  Significant improvement.  He took the oral antibiotics and has been applying the gentamicin cream as instructed.  No new complaints  Past Medical History:  Diagnosis Date   Actinic keratosis    Arthritis    knees   Asymptomatic Sinus bradycardia    a. 05/2016 Zio: Avg HR 61 (41-167).   Benign essential tremor    Cancer (HCC) 2002   melanoma right ear   Colon polyp    COPD (chronic obstructive pulmonary disease) (HCC)    pt said he believes it is a misdiagnosis    Coronary artery calcification seen on CT scan    a. 06/2016 CTA Chest: cor Ca2+; b. 05/2017 MV: EF >65%. No ischemia/infarct.   COVID-19 virus infection 04/06/2020   Depression    Dysplastic nevus 04/25/2022   Left lateral neck, severe, shave removal 06/05/22   History of echocardiogram    a. 04/2016 Echo: EF 60-65%, mild conc LVH. Nl PASP.   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 05/2016 Zio: Avg HR 61 (41-167). 9 SVT runs (fastest 167 - 5 beats; longest 8 beats - 101 bpm). Rare PACs/PVCs.   Pre-syncope    a. 06/2022 Zio: Predominantly sinus bradycardia with an average heart rate of 53 (40-148).  1 run of NSVT x 17 beats (143 bpm), 19 SVT runs (fastest 148 x 7 beats; longest 11 beats @ 114 bpm).  Rare PVCs/PACs. Triggered events assoc w/ sinus rhythym.   PSVT (paroxysmal supraventricular tachycardia) (HCC)    a. 05/2016 Zio: 9 SVT runs; b. 06/2022 Zio: 19 SVT runs, fastest 148, longest 11 beats.   PVC's (premature ventricular contractions)    Rhinitis     Sepsis due to cellulitis (HCC) 11/28/2021   Severe sepsis (HCC) 11/28/2021   Wears dentures    partial upper and lower   Wears hearing aid in both ears     Past Surgical History:  Procedure Laterality Date   ADENOIDECTOMY     CATARACT EXTRACTION W/PHACO Left 10/22/2017   Procedure: CATARACT EXTRACTION PHACO AND INTRAOCULAR LENS PLACEMENT (IOC)  LEFT;  Surgeon: Nevada Crane, MD;  Location: [facility];  Service: Ophthalmology;  Laterality: Left;   CATARACT EXTRACTION W/PHACO Right 11/20/2017   Procedure: CATARACT EXTRACTION PHACO AND INTRAOCULAR LENS PLACEMENT (IOC) RIGHT;  Surgeon: Nevada Crane, MD;  Location: [facility];  Service: Ophthalmology;  Laterality: Right;   ESOPHAGOGASTRODUODENOSCOPY (EGD) WITH PROPOFOL N/A 10/31/2016   Procedure: ESOPHAGOGASTRODUODENOSCOPY (EGD) WITH PROPOFOL;  Surgeon: Christena Deem, MD;  Location: [facility];  Service: Endoscopy;  Laterality: N/A;   I & D EXTREMITY Right 11/29/2021   Procedure: IRRIGATION AND DEBRIDEMENT EXTREMITY;  Surgeon: Henrene Dodge, MD;  Location: [facility] ORS;  Service: General;  Laterality: Right;   JOINT REPLACEMENT Right 2003   knee   knee meniscus repair Right    LEG WOUND REPAIR / CLOSURE  11/2021   MELANOMA EXCISION  Right    ear. Followed by Dr. Gwen Pounds   PATELLECTOMY Bilateral    TONSILLECTOMY      Allergies  Allergen Reactions   Penicillins Rash and Other (See Comments)    Other reaction(s): Other (see comments) Other reaction(s): UNKNOWN   Benzonatate     Pruritic rash   Lexapro [Escitalopram] Other (See Comments)    Increased hand tremor   Molnupiravir     Pruritic rash on neck     LT toes 02/02/2023  Physical Exam: General: The patient is alert and oriented x3 in no acute distress.  Dermatology: Superficial ulcer noted overlying the DIPJ of the left third toe.  It appears stable with significant improvement since last visit.  There is some surrounding erythema around  the toe extending into the dorsum of the forefoot  Vascular: Palpable pedal pulses bilaterally. Capillary refill within normal limits.    Neurological: Grossly intact via light touch  Musculoskeletal Exam: Hammertoe contracture noted to the lesser digits  Radiographic Exam LT foot 02/02/2023:  Chronic degenerative changes noted throughout the joints of the foot.  Hammertoe contracture noted also to the lesser digits.  No obvious indication of erosions that would be concerning for osteomyelitis.  No gas within the tissue  Assessment/Plan of Care: 1.  Ulcer overlying the DIPJ of the left third toe with mild cellulitis; resolved 2.  Hammertoe contracture lesser digits  -Patient evaluated.  -Light debridement of the loosely adhered skin was performed today.  The majority of the wound is mostly healed.  There is a remaining very superficial wound but overall significant improvement. -Continue the gentamicin cream with a light Band-Aid until completely resolved -Return to clinic as needed     Felecia Shelling, DPM [facility]  Dr. Felecia Shelling, DPM    [address] [place], [place] [address]                Office [phone]  Fax [phone]

## 2023-02-22 ENCOUNTER — Other Ambulatory Visit (HOSPITAL_COMMUNITY): Payer: Self-pay

## 2023-02-28 ENCOUNTER — Other Ambulatory Visit: Payer: Self-pay | Admitting: Internal Medicine

## 2023-03-01 ENCOUNTER — Other Ambulatory Visit: Payer: Self-pay | Admitting: Internal Medicine

## 2023-03-01 DIAGNOSIS — R4189 Other symptoms and signs involving cognitive functions and awareness: Secondary | ICD-10-CM | POA: Diagnosis not present

## 2023-03-01 DIAGNOSIS — R1310 Dysphagia, unspecified: Secondary | ICD-10-CM | POA: Diagnosis not present

## 2023-03-01 DIAGNOSIS — K5900 Constipation, unspecified: Secondary | ICD-10-CM | POA: Diagnosis not present

## 2023-03-01 DIAGNOSIS — R634 Abnormal weight loss: Secondary | ICD-10-CM | POA: Diagnosis not present

## 2023-03-01 NOTE — Telephone Encounter (Signed)
Medication was discontinued on 02/06/2023.

## 2023-03-04 ENCOUNTER — Encounter: Payer: Self-pay | Admitting: Internal Medicine

## 2023-03-04 ENCOUNTER — Other Ambulatory Visit: Payer: Self-pay | Admitting: Internal Medicine

## 2023-03-05 ENCOUNTER — Other Ambulatory Visit: Payer: Self-pay | Admitting: Family

## 2023-03-05 MED ORDER — ALPRAZOLAM 0.25 MG PO TABS: 0.2500 mg | ORAL_TABLET | Freq: Every evening | ORAL | 3 refills | Status: DC | PRN

## 2023-03-05 NOTE — Telephone Encounter (Signed)
Refilled: 11/03/2022 Last OV: 01/04/2023 Next OV: not scheduled

## 2023-03-13 ENCOUNTER — Other Ambulatory Visit: Payer: Self-pay | Admitting: Nurse Practitioner

## 2023-03-13 DIAGNOSIS — R634 Abnormal weight loss: Secondary | ICD-10-CM

## 2023-03-13 DIAGNOSIS — R1310 Dysphagia, unspecified: Secondary | ICD-10-CM

## 2023-03-16 ENCOUNTER — Ambulatory Visit
Admission: RE | Admit: 2023-03-16 | Discharge: 2023-03-16 | Disposition: A | Discharge status: Home / Self Care | Payer: PPO | Source: Ambulatory Visit | Attending: Nurse Practitioner | Admitting: Nurse Practitioner

## 2023-03-16 DIAGNOSIS — R1310 Dysphagia, unspecified: Secondary | ICD-10-CM | POA: Insufficient documentation

## 2023-03-16 DIAGNOSIS — R059 Cough, unspecified: Secondary | ICD-10-CM | POA: Diagnosis not present

## 2023-03-16 DIAGNOSIS — R634 Abnormal weight loss: Secondary | ICD-10-CM | POA: Diagnosis not present

## 2023-03-26 ENCOUNTER — Encounter: Payer: Self-pay | Admitting: Internal Medicine

## 2023-03-26 MED ORDER — SERTRALINE HCL 50 MG PO TABS: 75.0000 mg | ORAL_TABLET | Freq: Every day | ORAL | 1 refills | Status: DC

## 2023-04-05 ENCOUNTER — Other Ambulatory Visit: Payer: Self-pay | Admitting: Nurse Practitioner

## 2023-04-05 DIAGNOSIS — R1310 Dysphagia, unspecified: Secondary | ICD-10-CM

## 2023-04-05 DIAGNOSIS — R933 Abnormal findings on diagnostic imaging of other parts of digestive tract: Secondary | ICD-10-CM

## 2023-04-05 DIAGNOSIS — R634 Abnormal weight loss: Secondary | ICD-10-CM

## 2023-04-16 ENCOUNTER — Ambulatory Visit: Payer: PPO | Admitting: Podiatry

## 2023-04-18 ENCOUNTER — Ambulatory Visit
Admission: RE | Admit: 2023-04-18 | Discharge: 2023-04-18 | Disposition: A | Discharge status: Home / Self Care | Payer: PPO | Source: Ambulatory Visit | Attending: Nurse Practitioner | Admitting: Nurse Practitioner

## 2023-04-18 DIAGNOSIS — R1310 Dysphagia, unspecified: Secondary | ICD-10-CM | POA: Insufficient documentation

## 2023-04-18 DIAGNOSIS — R933 Abnormal findings on diagnostic imaging of other parts of digestive tract: Secondary | ICD-10-CM | POA: Insufficient documentation

## 2023-04-18 DIAGNOSIS — R638 Other symptoms and signs concerning food and fluid intake: Secondary | ICD-10-CM | POA: Diagnosis not present

## 2023-04-18 DIAGNOSIS — R634 Abnormal weight loss: Secondary | ICD-10-CM | POA: Insufficient documentation

## 2023-04-18 NOTE — Progress Notes (Addendum)
 Modified Barium Swallow Study  Patient Details  Name: Theodore [name] MRN: [id number] Date of Birth: 04/18/1929  Today's Date: 04/18/2023  Modified Barium Swallow completed.  Full report located under Chart Review in the Imaging Section.  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  Pt had f/u w/ GI and a DG Esophagus 03/2023 w/ Flash penetration with several episodes of coughing during the  study; Mild Esophageal phase Dysmotility.  No recent CXR per chart since 2023. No recent dxs of pneumonia per pt/Wife.   OF NOTE: pt has a MBSS in 2023 revealing adequate oropharyngeal abilities w/ endorsement of choking sometimes when eating/drining. General aspiration precautions and strategies were recommended then; no diet change.  Pt and Wife endorsed the swallowing issues have been ongoing for years now.    Clinical Impression Patient presents with Moderate oropharyngeal phase dysphagia per this assessment today. Performance appeared directly related to deconditioning/weakness in setting of Cognitive decline(baseline Dementia) and Malnutrition as well as Advance Age(89y). Pt has a Baseline of Dysphagia per chart w/ self-reported episodes of coughing when eating/drinking at home. Wife endorsed also. These issues increase risk for aspiration/aspiration pneumonia as well as impact oral intake overall. Pt and Wife denied any recent Pulmonary issues; no CXR (since 2023) nor tx for pneumonia noted per chart notes.  Oral phase was c/b grossly functional bolus management, mastication of softened solids, though  min increased time was needed for mastication and A-P transfer of soft solids -- suspect impact from Missing Dentition w/ need for extra mastication time. Slight+ bolus loss into the buccal areas noted intermittently. Premature spillage into the pharynx noted w/ thin liquids. Pt followed directions to use oral clearing strategies(lingual sweep) b/t trials to manage/clear any oral residue.  Swallow initiation appeared to occur along the posterior surface of the epiglottis(and slight spilling from) with thin/nectar liquids; min more timely swallow initiation was noted w/ nectar consistency liquids. Swallow initiation occurred at the valleculae w/ increased textured trials of purees, soft solids.   Pharyngeal stage was noted for reduced hyolaryngeal excursion and reduced BOT contact/stripping (suspect age related impact). Epiglottic inversion appeared partial w/ decreased airway closure/protection which resulted in Inconsistent, laryngeal Penetration/Aspiration w/ thin liquids before/during the swallow; no overt laryngeal penetration nor aspiration noted w/ nectar liquids and increased textures/foods. Pt was sensate to the Aspiration  responding w/ a Cough; less sensate to the laryngeal Penetration of thin liquids as well as the pharyngeal residue seeping along the underneath side of the epiglottis into the upper laryngeal vestibule b/t trials. MOD pharyngeal residue remained valleculae>pyriform sinuses w/ MIN amount along aeryepiglottic folds and on BOT intermittently w/ trials post-swallows (all consistencies involved); passive coating and seepage of this pharyngeal residue noted under the epiglottis and into the upper laryngeal vestibule (trace-min amount). Pt often responded w/ a delayed cough and/or throat clearing (pt and Wife stated throat clear/cough occurs at home with and without meals).   Amplitude/duration of PE segment opening appeared grossly Theodore [name] w/ fair bolus clearance noted into/through viewable  Cervical Esophageal area.    Consistencies tested were thin liquids via tsp 1, cup sip 3, multiple sips 3; Nectar liquids via tsp 1, cup sip 1, multiple sips 3; Honey  x1 tsp; Puree x1 (1/2) tsp; cookie w/ puree 2 trials.    Swallowing strategies practiced and recommended to pt, then Wife include: lingual sweep followed by a Dry swallow; throat clear/cough intermittently during and POST meals -- these were employed during the study and resulted in reduction of oropharyngeal residue and upper airway clearance of Penetrated/Aspirated material for improved airway protection overall.  The above reviewed via video and discussed thoroughly w/ pt and Wife.  Topics of Aging swallowing and Dysphagia, Aspiration precautions, and Swallowing strategies was discussed; Handouts given. Encouraged pt to remain Mobile (ie. his exercise class, gentle walking, etc) for Pulmonary health; increased air movement.  Discussion was initiated on the possible need to transition to Nectar consistency liquids in his diet -- but to continue to monitor for any negative sequelae of penetration/aspiration currently including decline in Pulmonary status. Recommended discussion w/ pt's PCP, including discussion of GOC/QOL wishes  if making further changes in his diet consistency. Recommend f/u w/ Palliative Care and Dietician for support also. Recommend f/u w/ skilled ST services for Dysphagia tx and Education on this MBSS as well as therapy. Factors that may increase risk of adverse event in presence of aspiration Noe & Lianne 2021): Reduced cognitive function;Limited mobility;Frail or deconditioned  Swallow Evaluation Recommendations Recommendations: PO diet PO Diet Recommendation: a more Dysphagia 3 (Mechanical soft); Thin liquids VIA CUP ONLY - NO STRAWS (Level 0) (easy to chew foods; moistened foods for cohesion). Explore need to transition to Nectar consistency liquids VIA CUP in his diet.  Liquid Administration via: Cup; No  straw Medication Administration: Whole meds with puree (vs need to Crush in Puree) Supervision: Patient able to self-feed; Intermittent supervision/cueing for swallowing strategies Swallowing strategies: Minimize environmental distractions; Slow rate;Small bites/sips; Multiple dry swallows after each bite/sip; Follow solids with liquids; Lingual Sweep b/t bites/sips w/ Dry swallow; Cough during (intermittently) and post meals to aid clearing airway Postural changes: Position pt fully upright for meals; Stay upright 30-60 min after meals; Out of bed for meals Oral care recommendations: Oral care BID (2x/day); Oral care before PO; Pt independent with oral care (support) Recommended consults: Consider Palliative care and Dietician for support, GOC  Caregiver Recommendations:  (CREAM SOUPS; No broth soups); verbal and visual Cues in the home for swallowing strategies; natural food/drink consistencies to support his swallowing Therapy Recommendations: Recommend f/u w/ skilled ST services per MD order for oropharyngeal phase Dysphagia, and Education on swallowing strategies utilized during MBSS as well as aspiration precautions; and Tx for exercises to strengthen the oropharyngeal swallow. Recommend Education on the Aging Swallow (also in setting of Cognitive decline); diet consistency modifications for ease of mastication d/t Missing Dentition and for overall ease of intake. Further discussion on potential transition to Nectar consistency liquids in hopes to reduce risk for aspiration/aspiration pneumonia (though this is not a risk-free option either).         Comer Portugal, MS, CCC-SLP Speech Language Pathologist Rehab Services; [facility] [phone] (ascom) Laquitha Heslin 04/18/2023,7:24 PM

## 2023-04-19 ENCOUNTER — Other Ambulatory Visit (HOSPITAL_COMMUNITY): Payer: Self-pay

## 2023-04-19 ENCOUNTER — Encounter: Payer: Self-pay | Admitting: Internal Medicine

## 2023-04-19 DIAGNOSIS — R471 Dysarthria and anarthria: Secondary | ICD-10-CM

## 2023-04-19 DIAGNOSIS — K117 Disturbances of salivary secretion: Secondary | ICD-10-CM

## 2023-04-19 DIAGNOSIS — E440 Moderate protein-calorie malnutrition: Secondary | ICD-10-CM

## 2023-04-19 DIAGNOSIS — I69991 Dysphagia following unspecified cerebrovascular disease: Secondary | ICD-10-CM

## 2023-04-19 DIAGNOSIS — F0283 Dementia in other diseases classified elsewhere, unspecified severity, with mood disturbance: Secondary | ICD-10-CM

## 2023-04-20 ENCOUNTER — Telehealth: Payer: Self-pay | Admitting: Internal Medicine

## 2023-04-20 ENCOUNTER — Other Ambulatory Visit (HOSPITAL_COMMUNITY): Payer: Self-pay

## 2023-04-20 NOTE — Telephone Encounter (Signed)
 Copied from CRM [id number]. Topic: General - Other >> Apr 20, 2023  3:16 PM Whitney O wrote: Reason for CRM: malaunia calling from [facility] concerning a referral for [facility] we willl be following them for palliative care and dr will still be primary care

## 2023-04-23 ENCOUNTER — Other Ambulatory Visit (HOSPITAL_COMMUNITY): Payer: Self-pay

## 2023-04-23 NOTE — Telephone Encounter (Signed)
 Will you be pt's attending provider?

## 2023-04-23 NOTE — Telephone Encounter (Signed)
[facility] is aware.

## 2023-04-24 ENCOUNTER — Ambulatory Visit: Payer: PPO | Admitting: Podiatry

## 2023-04-24 ENCOUNTER — Encounter: Payer: Self-pay | Admitting: Podiatry

## 2023-04-24 DIAGNOSIS — M79674 Pain in right toe(s): Secondary | ICD-10-CM | POA: Diagnosis not present

## 2023-04-24 DIAGNOSIS — L97522 Non-pressure chronic ulcer of other part of left foot with fat layer exposed: Secondary | ICD-10-CM | POA: Diagnosis not present

## 2023-04-24 DIAGNOSIS — B351 Tinea unguium: Secondary | ICD-10-CM | POA: Diagnosis not present

## 2023-04-24 DIAGNOSIS — M79675 Pain in left toe(s): Secondary | ICD-10-CM | POA: Diagnosis not present

## 2023-04-24 NOTE — Progress Notes (Signed)
Chief Complaint  Patient presents with   Wound Check    "It's about the same."   Nail Problem    "He needs his toenails trimmed."    SUBJECTIVE Patient presents to office today complaining of elongated, thickened nails that cause pain while ambulating in shoes.  Patient is unable to trim their own nails.  Patient continues to have a preulcerative callus lesion overlying the DIPJ of the left third toe.  Patient is here for further evaluation and treatment.  Past Medical History:  Diagnosis Date   Actinic keratosis    Arthritis    knees   Asymptomatic Sinus bradycardia    a. 05/2016 Zio: Avg HR 61 (41-167).   Benign essential tremor    Cancer (HCC) 2002   melanoma right ear   Colon polyp    COPD (chronic obstructive pulmonary disease) (HCC)    pt said he believes it is a misdiagnosis    Coronary artery calcification seen on CT scan    a. 06/2016 CTA Chest: cor Ca2+; b. 05/2017 MV: EF >65%. No ischemia/infarct.   COVID-19 virus infection 04/06/2020   Depression    Dysplastic nevus 04/25/2022   Left lateral neck, severe, shave removal 06/05/22   History of echocardiogram    a. 04/2016 Echo: EF 60-65%, mild conc LVH. Nl PASP.   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 05/2016 Zio: Avg HR 61 (41-167). 9 SVT runs (fastest 167 - 5 beats; longest 8 beats - 101 bpm). Rare PACs/PVCs.   Pre-syncope    a. 06/2022 Zio: Predominantly sinus bradycardia with an average heart rate of 53 (40-148).  1 run of NSVT x 17 beats (143 bpm), 19 SVT runs (fastest 148 x 7 beats; longest 11 beats @ 114 bpm).  Rare PVCs/PACs. Triggered events assoc w/ sinus rhythym.    PSVT (paroxysmal supraventricular tachycardia) (HCC)    a. 05/2016 Zio: 9 SVT runs; b. 06/2022 Zio: 19 SVT runs, fastest 148, longest 11 beats.   PVC's (premature ventricular contractions)    Rhinitis    Sepsis due to cellulitis (HCC) 11/28/2021   Severe sepsis (HCC) 11/28/2021   Wears dentures    partial upper and lower   Wears hearing aid in both ears     Allergies  Allergen Reactions   Penicillins Rash and Other (See Comments)    Other reaction(s): Other (see comments) Other reaction(s): UNKNOWN   Benzonatate     Pruritic rash   Lexapro [Escitalopram] Other (See Comments)    Increased hand tremor   Molnupiravir     Pruritic rash on neck      OBJECTIVE General Patient is awake, alert, and oriented x 3 and in no acute distress. Derm Skin is dry and supple bilateral. Negative open lesions or macerations. Remaining integument unremarkable. Nails are tender, long, thickened and dystrophic with subungual debris, consistent with onychomycosis, 1-5 bilateral. No signs of infection noted. Callus lesion noted overlying the DIPJ of the left third toe.  After debridement there is a superficial wound measuring approximately 0.2 x 0.2 x  0.1 cm.  Partial thickness limited to breakdown of the skin. Vasc  DP and PT pedal pulses palpable bilaterally. Temperature gradient within normal limits.  Neuro grossly intact via light touch Musculoskeletal Exam hammertoe deformity noted to the lesser digits bilateral  ASSESSMENT 1.  Pain due to onychomycosis of toenails both 2.  Superficial wound DIPJ left third toe 3.  Hammertoe contracture lesser digits bilateral  PLAN OF CARE 1. Patient evaluated today.  2. Instructed to maintain good pedal hygiene and foot care.  3. Mechanical debridement of nails 1-5 bilaterally performed using a nail nipper. Filed with dremel without incident.  4.  Light debridement of the superficial wound was performed today using a tissue nipper.  Recommend triple antibiotic  and a Band-Aid daily to protect the toe from the shoes  -Return to clinic in 3 mos. routine footcare   Felecia Shelling, DPM [facility]  Dr. Felecia Shelling, DPM    [address] [place], [place] [address]                Office [phone]  Fax [phone]

## 2023-04-26 DIAGNOSIS — H04123 Dry eye syndrome of bilateral lacrimal glands: Secondary | ICD-10-CM | POA: Diagnosis not present

## 2023-04-26 DIAGNOSIS — H0289 Other specified disorders of eyelid: Secondary | ICD-10-CM | POA: Diagnosis not present

## 2023-04-26 DIAGNOSIS — Z961 Presence of intraocular lens: Secondary | ICD-10-CM | POA: Diagnosis not present

## 2023-04-27 DIAGNOSIS — D164 Benign neoplasm of bones of skull and face: Secondary | ICD-10-CM | POA: Diagnosis not present

## 2023-04-27 DIAGNOSIS — H6123 Impacted cerumen, bilateral: Secondary | ICD-10-CM | POA: Diagnosis not present

## 2023-04-27 DIAGNOSIS — H903 Sensorineural hearing loss, bilateral: Secondary | ICD-10-CM | POA: Diagnosis not present

## 2023-05-03 ENCOUNTER — Other Ambulatory Visit: Payer: Self-pay | Admitting: Internal Medicine

## 2023-05-03 DIAGNOSIS — N1832 Chronic kidney disease, stage 3b: Secondary | ICD-10-CM

## 2023-05-03 DIAGNOSIS — Z79899 Other long term (current) drug therapy: Secondary | ICD-10-CM

## 2023-05-29 ENCOUNTER — Ambulatory Visit: Payer: PPO | Admitting: Dermatology

## 2023-05-29 ENCOUNTER — Encounter: Payer: Self-pay | Admitting: Dermatology

## 2023-05-29 DIAGNOSIS — Z86006 Personal history of melanoma in-situ: Secondary | ICD-10-CM

## 2023-05-29 DIAGNOSIS — D225 Melanocytic nevi of trunk: Secondary | ICD-10-CM | POA: Diagnosis not present

## 2023-05-29 DIAGNOSIS — W908XXA Exposure to other nonionizing radiation, initial encounter: Secondary | ICD-10-CM

## 2023-05-29 DIAGNOSIS — L578 Other skin changes due to chronic exposure to nonionizing radiation: Secondary | ICD-10-CM

## 2023-05-29 DIAGNOSIS — Z1283 Encounter for screening for malignant neoplasm of skin: Secondary | ICD-10-CM | POA: Diagnosis not present

## 2023-05-29 DIAGNOSIS — L821 Other seborrheic keratosis: Secondary | ICD-10-CM | POA: Diagnosis not present

## 2023-05-29 DIAGNOSIS — Z86018 Personal history of other benign neoplasm: Secondary | ICD-10-CM | POA: Diagnosis not present

## 2023-05-29 DIAGNOSIS — Z85820 Personal history of malignant melanoma of skin: Secondary | ICD-10-CM | POA: Diagnosis not present

## 2023-05-29 DIAGNOSIS — L814 Other melanin hyperpigmentation: Secondary | ICD-10-CM

## 2023-05-29 DIAGNOSIS — L820 Inflamed seborrheic keratosis: Secondary | ICD-10-CM | POA: Diagnosis not present

## 2023-05-29 DIAGNOSIS — R234 Changes in skin texture: Secondary | ICD-10-CM | POA: Diagnosis not present

## 2023-05-29 DIAGNOSIS — D1801 Hemangioma of skin and subcutaneous tissue: Secondary | ICD-10-CM

## 2023-05-29 DIAGNOSIS — D229 Melanocytic nevi, unspecified: Secondary | ICD-10-CM

## 2023-05-29 NOTE — Patient Instructions (Addendum)

## 2023-05-29 NOTE — Progress Notes (Signed)
 Follow-Up Visit   Subjective  Theodore [name] is a 89 y.o. male who presents for the following: Skin Cancer Screening and Upper Body Skin Exam. Hx of MM, Hx of MIS. HxDN. Hx of AKs. Hx of hypertrophic AKs vs ISKs at central upper forehead. Tx with LN2 11/20/2022.  The patient presents for Upper Body Skin Exam (UBSE) for skin cancer screening and mole check. The patient has spots, moles and lesions to be evaluated, some may be new or changing and the patient may have concern these could be cancer.    The following portions of the chart were reviewed this encounter and updated as appropriate: medications, allergies, medical history  Review of Systems:  No other skin or systemic complaints except as noted in HPI or Assessment and Plan.  Objective  Well appearing patient in no apparent distress; mood and affect are within normal limits.  All skin waist up examined. Relevant physical exam findings are noted in the Assessment and Plan.  Inferior Left Jaw x1, Central upper forehead (vs hypertrophic AK) x1 (2) Erythematous keratotic or waxy stuck-on papule    Assessment & Plan   INFLAMED SEBORRHEIC KERATOSIS (2) Inferior Left Jaw x1, Central upper forehead (vs hypertrophic AK) x1 (2) (Vs hypertrophic AK at central upper forehead)  Symptomatic, irritating, patient would like treated. Destruction of lesion - Inferior Left Jaw x1, Central upper forehead (vs hypertrophic AK) x1 (2)  Destruction method: cryotherapy   Informed consent: discussed and consent obtained   Lesion destroyed using liquid nitrogen: Yes   Region frozen until ice ball extended beyond lesion: Yes   Outcome: patient tolerated procedure well with no complications   Post-procedure details: wound care instructions given   Additional details:  Prior to procedure, discussed risks of blister formation, small wound, skin dyspigmentation, or rare scar following cryotherapy. Recommend Vaseline ointment to treated areas while  healing.   Skin cancer screening performed today.  History of Melanoma - treated in the 90s. Right ear - No evidence of recurrence today - Recommend regular full body skin exams - Recommend daily broad spectrum sunscreen SPF 30+ to sun-exposed areas, reapply every 2 hours as needed.  - Call if any new or changing lesions are noted between office visits  History of Melanoma in Situ - 2015. Left jaw - No evidence of recurrence today  - Recommend regular full body skin exams - Recommend daily broad spectrum sunscreen SPF 30+ to sun-exposed areas, reapply every 2 hours as needed.  - Call if any new or changing lesions are noted between office visits  History of Dysplastic Nevi Left lateral neck (severe 06/05/2022), L ant shoulder (severe 05/07/2017), R lower sternum (moderate 09/06/2020) - No evidence of recurrence today - Recommend regular full body skin exams - Recommend daily broad spectrum sunscreen SPF 30+ to sun-exposed areas, reapply every 2 hours as needed.  - Call if any new or changing lesions are noted between office visits  Actinic Damage - Chronic condition, secondary to cumulative UV/sun exposure - diffuse scaly erythematous macules with underlying dyspigmentation - Recommend daily broad spectrum sunscreen SPF 30+ to sun-exposed areas, reapply every 2 hours as needed.  - Staying in the shade or wearing long sleeves, sun glasses (UVA+UVB protection) and wide brim hats (4-inch brim around the entire circumference of the hat) are also recommended for sun protection.  - Call for new or changing lesions.  Lentigines, Seborrheic Keratoses, Hemangiomas - Benign normal skin lesions - Benign-appearing - Call for any changes  Melanocytic  Nevi - Tan-brown and/or pink-flesh-colored symmetric macules and papules - Right upper abdomen 4 mm medium brown macule, stable - Benign appearing on exam today - Observation - Call clinic for new or changing moles - Recommend daily use of broad  spectrum spf 30+ sunscreen to sun-exposed areas.   Eschar; Hx of recent fall  Exam: Heme crusted linear papule at R posterior shoulder.  Treatment: The patient will observe these symptoms, and report promptly any worsening or unexpected persistence.   SEBORRHEIC KERATOSIS. Biopsy proven 01/12/2014.  - 3 cm speckled waxy tan patch at frontal scalp, - Reviewed photo from 03/01/2020, Lighter compared to previous photo, new photo today. - Benign-appearing - Discussed benign etiology and prognosis. - Observe - Call for any changes   Return in about 6 months (around 11/29/2023) for UBSE, HxMM, HxMIS, HxDN.  I, Lawson Radar, CMA, am acting as scribe for Willeen Niece, MD.   Documentation: I have reviewed the above documentation for accuracy and completeness, and I agree with the above.  Willeen Niece, MD

## 2023-05-31 ENCOUNTER — Other Ambulatory Visit (INDEPENDENT_AMBULATORY_CARE_PROVIDER_SITE_OTHER)

## 2023-05-31 DIAGNOSIS — Z79899 Other long term (current) drug therapy: Secondary | ICD-10-CM

## 2023-06-01 ENCOUNTER — Other Ambulatory Visit: Payer: Self-pay | Admitting: Internal Medicine

## 2023-06-01 LAB — CBC WITH DIFFERENTIAL/PLATELET
Basophils Absolute: 0 10*3/uL (ref 0.0–0.1)
Basophils Relative: 0.8 % (ref 0.0–3.0)
Eosinophils Absolute: 0.2 10*3/uL (ref 0.0–0.7)
Eosinophils Relative: 4.4 % (ref 0.0–5.0)
HCT: 39.6 % (ref 39.0–52.0)
Hemoglobin: 13.5 g/dL (ref 13.0–17.0)
Lymphocytes Relative: 27.5 % (ref 12.0–46.0)
Lymphs Abs: 1.2 10*3/uL (ref 0.7–4.0)
MCHC: 34.1 g/dL (ref 30.0–36.0)
MCV: 96.3 fl (ref 78.0–100.0)
Monocytes Absolute: 0.4 10*3/uL (ref 0.1–1.0)
Monocytes Relative: 8.8 % (ref 3.0–12.0)
Neutro Abs: 2.5 10*3/uL (ref 1.4–7.7)
Neutrophils Relative %: 58.5 % (ref 43.0–77.0)
Platelets: 229 10*3/uL (ref 150.0–400.0)
RBC: 4.11 Mil/uL — ABNORMAL LOW (ref 4.22–5.81)
RDW: 13 % (ref 11.5–15.5)
WBC: 4.3 10*3/uL (ref 4.0–10.5)

## 2023-06-01 LAB — COMPREHENSIVE METABOLIC PANEL
ALT: 17 U/L (ref 0–53)
AST: 20 U/L (ref 0–37)
Albumin: 4.3 g/dL (ref 3.5–5.2)
Alkaline Phosphatase: 48 U/L (ref 39–117)
BUN: 35 mg/dL — ABNORMAL HIGH (ref 6–23)
CO2: 29 meq/L (ref 19–32)
Calcium: 9 mg/dL (ref 8.4–10.5)
Chloride: 104 meq/L (ref 96–112)
Creatinine, Ser: 1.63 mg/dL — ABNORMAL HIGH (ref 0.40–1.50)
GFR: 36.49 mL/min — ABNORMAL LOW (ref >60.00)
Glucose, Bld: 72 mg/dL (ref 70–99)
Potassium: 5 meq/L (ref 3.5–5.1)
Sodium: 140 meq/L (ref 135–145)
Total Bilirubin: 0.8 mg/dL (ref 0.2–1.2)
Total Protein: 6.6 g/dL (ref 6.0–8.3)

## 2023-06-01 LAB — TSH: TSH: 0.4 u[IU]/mL (ref 0.35–5.50)

## 2023-06-02 ENCOUNTER — Encounter: Payer: Self-pay | Admitting: Internal Medicine

## 2023-06-02 NOTE — Assessment & Plan Note (Signed)
 GFR has declined since October.  Reminded to continue avoidance of NSAIDs. And follow up with nephrology.   Lab Results  Component Value Date   CREATININE 1.63 (H) 05/31/2023

## 2023-06-08 ENCOUNTER — Encounter: Payer: Self-pay | Admitting: Internal Medicine

## 2023-06-11 ENCOUNTER — Ambulatory Visit (INDEPENDENT_AMBULATORY_CARE_PROVIDER_SITE_OTHER): Admitting: Internal Medicine

## 2023-06-11 VITALS — BP 118/52 | HR 56 | Ht 75.0 in | Wt 162.2 lb

## 2023-06-11 DIAGNOSIS — H8110 Benign paroxysmal vertigo, unspecified ear: Secondary | ICD-10-CM | POA: Diagnosis not present

## 2023-06-11 NOTE — Assessment & Plan Note (Signed)
-  Patient presents today with positional vertigo with episodes that last a few minutes at a time and then resolve.  This is happening for the last 2 weeks. -Patient also complained of a fall approximately 2 weeks ago prior to the onset of the symptoms but also had an additional fall within the last 2 weeks secondary to vertigo. -On exam, no nystagmus noted.  Strength was intact.  Attempted the Epley maneuver but was unable to elicit nystagmus or vertigo. -Patient likely has BPPV -Referral placed to vestibular PT for further evaluation -Instruction given on getting up slowly and waiting for the vertigo to resolve before attempting to stand up -Return precautions given to the patient and his wife -No further workup at this time

## 2023-06-11 NOTE — Progress Notes (Signed)
   Acute Office Visit  Subjective:     Patient ID: Theodore [name], male    DOB: 04-18-1929, 89 y.o.   MRN: [id number]  Chief Complaint  Patient presents with   Dizziness    Dizziness Pertinent negatives include no congestion or headaches.   Patient is in today for worsening dizziness and fall.  Patient states that over the last 2 weeks he has noted dizziness when he attempts to get out of bed in the morning.  Patient states that the room is spinning and that he has to lay back down for the feeling to pass before getting up.  Patient states that the vertigo lasts only for a few minutes and then resolves.  It happens with sudden movements of his head especially when going from a laying down to standing position.  He did have a fall approximately 2 weeks ago where he slid off a chair and hit the back of his head and had another fall recently after the vertigo started.  No fevers or chills.  No nausea or vomiting.  No focal weakness.  No loss of balance.  Patient able to ambulate with rollator without issues.  Patient has chronic hearing loss and requires hearing aids.  No tenderness noted.  Review of Systems  Constitutional: Negative.   HENT:  Positive for hearing loss. Negative for congestion, sinus pain and tinnitus.   Respiratory: Negative.    Cardiovascular: Negative.   Neurological:  Positive for dizziness. Negative for speech change, focal weakness and headaches.  Psychiatric/Behavioral: Negative.          Objective:    BP (!) 118/52   Pulse (!) 56   Ht 6\' 3"  (1.905 m)   Wt 162 lb 3.2 oz (73.6 kg)   SpO2 99%   BMI 20.27 kg/m    Physical Exam Constitutional:      Appearance: Normal appearance.  HENT:     Head: Normocephalic and atraumatic.  Cardiovascular:     Rate and Rhythm: Normal rate and regular rhythm.     Heart sounds: Normal heart sounds.  Pulmonary:     Effort: No respiratory distress.     Breath sounds: Normal breath sounds. No wheezing.  Neurological:      Mental Status: He is alert and oriented to person, place, and time.     Cranial Nerves: Cranial nerves 2-12 are intact. No dysarthria or facial asymmetry.     Motor: No weakness or tremor.     Comments: I attempted the Epley maneuver but was unable to elicit nystagmus or vertigo.     No results found for any visits on 06/11/23.      Assessment & Plan:   Problem List Items Addressed This Visit       Nervous and Auditory   BPPV (benign paroxysmal positional vertigo) - Primary   Relevant Orders   Ambulatory referral to Physical Therapy    No orders of the defined types were placed in this encounter.   No follow-ups on file.  Earl Lagos, MD

## 2023-06-11 NOTE — Patient Instructions (Signed)
-  Was a pleasure meeting you today -I suspect that your dizziness is caused by benign paroxysmal positional vertigo (BPPV) -I have given you a handout to read about this -I have put in a referral to physical therapy (vestibular therapy) to help with exercises for this -In the meantime, please get up slowly from bed or from sitting down and make sure that the dizziness is passed before moving. -Please call us with any questions or concerns or worsening symptoms.

## 2023-06-12 DIAGNOSIS — M65332 Trigger finger, left middle finger: Secondary | ICD-10-CM | POA: Diagnosis not present

## 2023-06-12 DIAGNOSIS — M65342 Trigger finger, left ring finger: Secondary | ICD-10-CM | POA: Diagnosis not present

## 2023-06-20 ENCOUNTER — Encounter: Payer: Self-pay | Admitting: Internal Medicine

## 2023-06-20 ENCOUNTER — Ambulatory Visit (INDEPENDENT_AMBULATORY_CARE_PROVIDER_SITE_OTHER): Admitting: Internal Medicine

## 2023-06-20 VITALS — BP 136/70 | HR 62 | Temp 97.7°F | Ht 75.0 in | Wt 163.6 lb

## 2023-06-20 DIAGNOSIS — M79672 Pain in left foot: Secondary | ICD-10-CM

## 2023-06-20 MED ORDER — PREDNISONE 10 MG PO TABS
ORAL_TABLET | ORAL | 0 refills | Status: DC
Start: 2023-06-20 — End: 2023-07-18

## 2023-06-20 MED ORDER — DOXYCYCLINE HYCLATE 100 MG PO TABS: 100.0000 mg | ORAL_TABLET | Freq: Two times a day (BID) | ORAL | 0 refills | Status: DC

## 2023-06-20 NOTE — Patient Instructions (Signed)
 I think your pain may be from GOUT,  but I am covering you  for cellulitis as well   The prednisone is a gradual taper ober 12 days :   4 tablets daily x 3 days 3 tablets daily x 3 days 2 tablets daily x 3 days 1 tablet daily x 3 days   The antibiotic is doxycycline.  Take it twice daily WITH FOOD  to avoid nausea  Sending nausea medication just in case.    Daily use of a probiotic advised for 3 weeks.    Protect your legs with the "leg protectors"    If symptoms do not resolve,  or if they return let me know

## 2023-06-21 ENCOUNTER — Other Ambulatory Visit: Payer: Self-pay | Admitting: Internal Medicine

## 2023-06-21 DIAGNOSIS — M79672 Pain in left foot: Secondary | ICD-10-CM | POA: Insufficient documentation

## 2023-06-21 NOTE — Assessment & Plan Note (Signed)
 Exam suggestive of gout, but will treat for cellulitis as well .[name] Prednisone taper x 12 days and doxycycline for 7 days.  Protbiotic advised

## 2023-06-21 NOTE — Progress Notes (Signed)
 Subjective:  Patient ID: Theodore [name], male    DOB: February 06, 1930  Age: 89 y.o. MRN: [id number]  CC: The encounter diagnosis was Foot pain, left.   HPI Theodore [name] presents for  Chief Complaint  Patient presents with   Foot Swelling    LEFT FOOT RED SWOLLEN WARM TO TOUCH SHARP PAINS KEEPS HIM FROM SLEEPING GOOD AT [name] presents with his wife [name] today.  He has been having pain and swelling of his left foot for the past ten days.  The pain is at times sharp but responds to tylenol transiently. [name]  He denies any recent injury.  No history of gout. Denies fevers.     Outpatient Medications Prior to Visit  Medication Sig Dispense Refill   ALPRAZolam (XANAX) 0.25 MG tablet Take 1 tablet (0.25 mg total) by mouth at bedtime as needed for anxiety or sleep. 30 tablet 3   ascorbic acid (VITAMIN C) 500 MG tablet Take 500 mg by mouth daily.     buPROPion (WELLBUTRIN SR) 150 MG 12 hr tablet Take 1 tablet (150 mg total) by mouth in the morning. 90 tablet 1   cycloSPORINE (RESTASIS) 0.05 % ophthalmic emulsion Place 1 drop into both eyes 2 (two) times daily.     ezetimibe (ZETIA) 10 MG tablet TAKE 1 TABLET BY MOUTH EVERY DAY 60 tablet 6   levothyroxine (SYNTHROID) 100 MCG tablet TAKE 1 TABLET BY MOUTH EVERY DAY BEFORE BREAKFAST 90 tablet 3   mirtazapine (REMERON) 7.5 MG tablet TAKE 1 TABLET BY MOUTH AT BEDTIME. 90 tablet 1   Probiotic Product (MISC INTESTINAL FLORA REGULAT) CAPS Take by mouth.     sertraline (ZOLOFT) 50 MG tablet Take 1.5 tablets (75 mg total) by mouth daily. 135 tablet 1   testosterone cypionate (DEPOTESTOSTERONE CYPIONATE) 200 MG/ML injection INJECT 0.5 MLS (100 MG TOTAL) INTO THE MUSCLE EVERY 14 (FOURTEEN) DAYS. 1 mL 2   vitamin B-12 (CYANOCOBALAMIN) 500 MCG tablet Take 1 tablet by mouth daily.     ZINC GLUCONATE PO Take 80 mg by mouth daily.     No facility-administered medications prior to visit.    Review of Systems;  Patient denies headache, fevers, malaise,  unintentional weight loss, skin rash, eye pain, sinus congestion and sinus pain, sore throat, dysphagia,  hemoptysis , cough, dyspnea, wheezing, chest pain, palpitations, orthopnea, edema, abdominal pain, nausea, melena, diarrhea, constipation, flank pain, dysuria, hematuria, urinary  Frequency, nocturia, numbness, tingling, seizures,  Focal weakness, Loss of consciousness,  Tremor, insomnia, depression, anxiety, and suicidal ideation.      Objective:  BP 136/70   Pulse 62   Temp 97.7 F (36.5 C) (Oral)   Ht 6\' 3"  (1.905 m)   Wt 163 lb 9.6 oz (74.2 kg)   SpO2 99%   BMI 20.45 kg/m   BP Readings from Last 3 Encounters:  06/20/23 136/70  06/11/23 (!) 118/52  01/04/23 127/73    Wt Readings from Last 3 Encounters:  06/20/23 163 lb 9.6 oz (74.2 kg)  06/11/23 162 lb 3.2 oz (73.6 kg)  02/02/23 162 lb (73.5 kg)    Physical Exam Vitals reviewed.  Constitutional:      General: He is not in acute distress.    Appearance: Normal appearance. He is normal weight. He is not ill-appearing, toxic-appearing or diaphoretic.  HENT:     Head: Normocephalic.  Eyes:     General: No scleral icterus.       Right eye: No discharge.  Left eye: No discharge.     Conjunctiva/sclera: Conjunctivae normal.  Cardiovascular:     Rate and Rhythm: Normal rate and regular rhythm.     Heart sounds: Normal heart sounds.  Pulmonary:     Effort: Pulmonary effort is normal. No respiratory distress.     Breath sounds: Normal breath sounds.  Musculoskeletal:        General: Swelling and tenderness present. Normal range of motion.     Cervical back: Normal range of motion.  Feet:     Left foot:     Skin integrity: Erythema, warmth and dry skin present. No skin breakdown.     Comments: Great toe is tender to palpation Erythema is position dependent and resolves with foot elevation Varicose veins present  Skin:    General: Skin is warm and dry.  Neurological:     General: No focal deficit present.      Mental Status: He is alert and oriented to person, place, and time. Mental status is at baseline.  Psychiatric:        Mood and Affect: Mood normal.        Behavior: Behavior normal.        Thought Content: Thought content normal.        Judgment: Judgment normal.     Lab Results  Component Value Date   HGBA1C 5.0 01/04/2023   HGBA1C 4.8 01/17/2021   HGBA1C 4.7 05/28/2018    Lab Results  Component Value Date   CREATININE 1.63 (H) 05/31/2023   CREATININE 1.29 01/04/2023   CREATININE 1.22 06/21/2022    Lab Results  Component Value Date   WBC 4.3 05/31/2023   HGB 13.5 05/31/2023   HCT 39.6 05/31/2023   PLT 229.0 05/31/2023   GLUCOSE 72 05/31/2023   CHOL 152 01/04/2023   TRIG 78.0 01/04/2023   HDL 65.20 01/04/2023   LDLDIRECT 72.0 01/04/2023   LDLCALC 71 01/04/2023   ALT 17 05/31/2023   AST 20 05/31/2023   NA 140 05/31/2023   K 5.0 05/31/2023   CL 104 05/31/2023   CREATININE 1.63 (H) 05/31/2023   BUN 35 (H) 05/31/2023   CO2 29 05/31/2023   TSH 0.40 05/31/2023   PSA 21.68 (H) 02/11/2021   INR 1.4 (H) 11/29/2021   HGBA1C 5.0 01/04/2023   MICROALBUR 0.4 10/18/2022    DG SWALLOW FUNC OP MEDICARE SPEECH PATH Result Date: 04/18/2023 Table formatting from the original result was not included. 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  Pt had f/u w/ GI and a DG Esophagus 03/2023 w/ "Flash penetration with several episodes of coughing during the  study; Mild Esophageal  phase Dysmotility.  No recent CXR per chart since 2023. No recent dxs of pneumonia per pt/Wife.  OF NOTE: pt has a MBSS in 2023 revealing "adequate oropharyngeal abilities" w/ endorsement of "choking sometimes" when eating/drining. General aspiration precautions and strategies were recommended then; no diet change.  Pt and Wife endorsed the swallowing issues have been ongoing for "years now". Clinical Impression Patient presents with Moderate oropharyngeal phase dysphagia per this assessment today. Performance appeared directly  related to deconditioning/weakness in setting of Cognitive decline(baseline Dementia) and Malnutrition as well as Advance Age(89y). Pt has a Baseline of Dysphagia per chart w/ self-reported episodes of "coughing" when eating/drinking at home. Wife endorsed also. These issues increase risk for aspiration/aspiration pneumonia as well as impact oral intake overall. Pt and Wife denied any recent Pulmonary issues; no CXR (since 2023) nor tx for pneumonia noted per chart notes. Oral phase was c/b grossly functional bolus management, mastication of softened solids though min increased time was needed b/f initation of A-P transfer -- suspect impact from Missing Dentition and need for extra mastication time. Slight+ bolus loss into the buccal areas noted intermittently. Premature spillage into the pharynx noted w/ thin liquids. Pt followed directions to use oral clearing strategies b/t trials to manage/clear any oral residue. Swallow initiation appeared to occur along the posterior surface of the epiglottis(+) with thin/nectar liquids; min more timely swallow initiation was noted w/ nectar consistency liquids. Swallow initiation occurred at the valleculae w/ increased textured trials.  Pharyngeal stage was noted for reduced hyolaryngeal excursion and reduced BOT contact/stripping (suspect age related impact). Epiglottic inversion appeared partial w/ decreased airway closure/protection which resulted  in Inconsistent, laryngeal Penetration/Aspiration w/ thin liquids before/during the swallow; no overt laryngeal penetration nor aspiration noted w/ nectar liquids. Pt was sensate to the Aspiration  responding w/ a Cough; less sensate to the laryngeal Penetration of thin liquids as well as the pharyngeal residue seeping along the underneath side of the epiglottis into the upper laryngeal vestibule b/t trials. MOD pharyngeal residue remained BOT>pyriform sinuses post-swallows w/ ALL consistencies; passive coating and seepage of this residue noted under the epiglottis and into the upper laryngeal vestibule (trace-min amount). Pt often responded w/ a delayed cough.  Amplitude/duration of PE segment opening appeared grossly [facility] w/ fair bolus clearance noted in viewable Esophageal area.   Consistencies tested were thin liquids via tsp 1, cup sip 3, multiple sips 3; Nectar liquids via tsp 1, cup sip 1, multiple sips 3; Honey x1 tsp; Puree x1 (1/2) tsp; cookie w/ puree 2 trials.  Swallowing strategies practiced and recommended to pt, then Wife include: lingual sweep followed by a Dry swallow; throat clear/cough intermittently during and POST meals -- these were employed during the study and resulted in reduction of oropharyngeal residue and upper airway clearance of Penetrated material for improved airway protection overall.  The above reviewed via video and discussed thoroughly w/ pt and Wife. Topics of Aging swallowing and Dysphagia, Aspiration precautions, and Swallowing strategies was discussed; Handouts given. Encouraged pt to remain Mobile for Pulmonary health; increased air movement. Discussion was initiated on the possible need to transition to Nectar consistency liquids in his diet -- but first, continue to monitor for any negative sequelae of penetration/aspiration including decline in Pulmonary status, and discuss pt's GOC/QOL wishes w/ PCP and Wife if making further changes in his diet consistency. Recommend f/u  w/ Palliative Care and Dietician for support also. Factors that may increase risk of adverse event in presence of aspiration [employer] 2021): Reduced cognitive function;Limited mobility;Frail or deconditioned Swallow Evaluation Recommendations Recommendations: PO diet PO Diet Recommendation: a more Dysphagia 3 (Mechanical soft); Thin liquids VIA CUP ONLY - NO STRAWS (Level 0) (easy to chew foods; moistened foods for cohesion). Explore need to transition to Nectar consistency liquids VIA CUP in his diet. Liquid Administration via: Cup; No straw Medication Administration: Whole meds with puree (vs need to Crush in Puree) Supervision: Patient able to self-feed; Intermittent supervision/cueing  for swallowing strategies Swallowing strategies: Minimize environmental distractions; Slow rate;Small bites/sips; Multiple dry swallows after each bite/sip; Follow solids with liquids; Lingual Sweep b/t bites/sips w/ Dry swallow; Cough during (intermittently) and post meals to aid clearing airway Postural changes: Position pt fully upright for meals; Stay upright 30-60 min after meals; Out of bed for meals Oral care recommendations: Oral care BID (2x/day); Oral care before PO; Pt independent with oral care (support) Recommended consults: Consider Palliative care and Dietician for support Caregiver Recommendations:  (CREAM SOUPS; No broth soups); verbal and visual Cues in the home for swallowing strategies; natural food/drink consistencies Treatment Plan Treatment Plan Treatment recommendations: Defer treatment plan to SLP at other venue (see follow-up recommendations) Follow-up recommendations: Home health SLP (vs Outpatient f/u for Dysphagia tx) Recommendations Comment: education; assess benefits of transition to Nectar  consistency liquids in his diet Functional status assessment: Patient has had a recent decline in their functional status and/or demonstrates limited ability to make significant improvements in function  in a reasonable and predictable amount of time. Treatment frequency: -- (TBD) Treatment duration: -- (TBD) Interventions: Aspiration precaution training; Compensatory techniques; Patient/family education (Strategies) Recommendations Recommendations for follow up therapy are one component of a multi-disciplinary discharge planning process, led by the attending physician.  Recommendations may be updated based on patient status, additional functional criteria and insurance authorization. Assessment: Orofacial Exam: Orofacial Exam Oral Cavity: Oral Hygiene: WFL (min dry?) Oral Cavity - Dentition: Missing dentition (Denture plate lower) Orofacial Anatomy: WFL Oral Motor/Sensory Function: WFL (for strength/ROM; though min decreased rapid lingual movements; min decreased articulation in his speech noted also (baseline per Wife/pt)) Anatomy: Anatomy: Suspected cervical osteophytes (min) Boluses Administered: Boluses Administered Boluses Administered: Thin liquids (Level 0); Mildly thick liquids (Level 2, nectar thick); Moderately thick liquids (Level 3, honey thick); Puree; Solid  Oral Impairment Domain: Oral Impairment Domain Lip Closure: No labial escape Tongue control during bolus hold: Escape to lateral buccal cavity/floor of mouth (intermittent w/ liquids) Bolus preparation/mastication: Slow prolonged chewing/mashing with complete recollection (w/ softened solids) Bolus transport/lingual motion: Brisk tongue motion (WFL - min slower w/ A-P transfer effort w/ soft solid trials) Oral residue: Trace residue lining oral structures Location of oral residue : Tongue Initiation of pharyngeal swallow : Posterior laryngeal surface of the epiglottis (w/ liquids; Valleculae w/ increased textures)  Pharyngeal Impairment Domain: Pharyngeal Impairment Domain Soft palate elevation: No bolus between soft palate (SP)/pharyngeal wall (PW) Laryngeal elevation: Partial superior movement of thyroid cartilage/partial approximation of  arytenoids to epiglottic petiole Anterior hyoid excursion: Partial anterior movement Epiglottic movement: Partial inversion Laryngeal vestibule closure: Incomplete, narrow column air/contrast in laryngeal vestibule Pharyngeal stripping wave : Present - diminished Pharyngeal contraction (A/P view only): N/A Pharyngoesophageal segment opening: Complete distension and complete duration, no obstruction of flow Tongue base retraction: Trace column of contrast or air between tongue base and PPW Pharyngeal residue: Collection of residue within or on pharyngeal structures Location of pharyngeal residue: Tongue base; Valleculae; Pharyngeal wall; Aryepiglottic folds; Pyriform sinuses  Esophageal Impairment Domain: Esophageal Impairment Domain Esophageal clearance upright position: Complete clearance, esophageal coating (in viewable area) Pill: Pill Consistency administered: -- (n/a) Penetration/Aspiration Scale Score: Penetration/Aspiration Scale Score 1.  Material does not enter airway: Moderately thick liquids (Level 3, honey thick); Puree; Solid 3.  Material enters airway, remains ABOVE vocal cords and not ejected out: Thin liquids (Level 0); Mildly thick liquids (Level 2, nectar thick) 5.  Material enters airway, CONTACTS cords and not ejected out: Thin liquids (Level 0) (x1) Compensatory Strategies: Compensatory  Strategies Compensatory strategies: Yes Multiple swallows: Effective (to help reduce pharyngeal residue) Other(comment): Effective (Cough aided clearing/reducing material in the larygeal vestibule) Effective Other(comment): Thin liquid (Level 0) (utilized w/)   General Information: Caregiver present: Yes (Wife)  Diet Prior to this Study: Regular; Thin liquids (Level 0) (soft foods at home)   No data recorded  Respiratory Status: WFL   Supplemental O2: None (Room air)   History of Recent Intubation: No  Behavior/Cognition: Alert; Cooperative; Pleasant mood; Distractible; Requires cueing (cues needed  intermittently) Self-Feeding Abilities: Able to self-feed (setup) Baseline vocal quality/speech: Normal Volitional Cough: Able to elicit Volitional Swallow: Able to elicit Exam Limitations: No limitations Goal Planning: Prognosis for improved oropharyngeal function: Fair Barriers to Reach Goals: Cognitive deficits; Time post onset; Severity of deficits Barriers/Prognosis Comment: Baseline Dysphagia; Advanced Age Patient/Family Stated Goal: to learn more about the swallowing strategies to implement daily Consulted and agree with results and recommendations: Patient; Family member/caregiver (Wife) Pain: Pain Assessment Pain Assessment: No/denies pain End of Session: Start Time:SLP Start Time (ACUTE ONLY): 1300 Stop Time: SLP Stop Time (ACUTE ONLY): 1430 Time Calculation:SLP Time Calculation (min) (ACUTE ONLY): 90 min Charges: SLP Evaluations $ SLP Speech Visit: 1 Visit SLP Evaluations $MBS Swallow: 1 Procedure SLP visit diagnosis: SLP Visit Diagnosis: Dysphagia, oropharyngeal phase (R13.12) (baseline Dementia; advanced age) Past Medical History: Past Medical History: Diagnosis Date  Actinic keratosis   Arthritis   knees  Asymptomatic Sinus bradycardia   a. 05/2016 Zio: Avg HR 61 (41-167).  Benign essential tremor   Cancer (HCC) 2002  melanoma right ear  Colon polyp   COPD (chronic obstructive pulmonary disease) (HCC)   pt said he believes it is a misdiagnosis   Coronary artery calcification seen on CT scan   a. 06/2016 CTA Chest: cor Ca2+; b. 05/2017 MV: EF >65%. No ischemia/infarct.  COVID-19 virus infection 04/06/2020  Depression   Dysplastic nevus 04/25/2022  Left lateral neck, severe, shave removal 06/05/22  History of echocardiogram   a. 04/2016 Echo: EF 60-65%, mild conc LVH. Nl PASP.  History of kidney stones   Hx of dysplastic nevus 05/07/2017  L anterior shoulder, severe  Hx of dysplastic nevus 09/06/2020  R lower sternum, moderate atypia  Hypothyroidism   Hypothyroidism   Laceration of right lower extremity    Melanoma (HCC) 1990's per pt  R ear  Melanoma in situ (HCC) 01/12/2014  left jaw  MSSA infection, non-invasive   Palpitations   a. 05/2016 Zio: Avg HR 61 (41-167). 9 SVT runs (fastest 167 - 5 beats; longest 8 beats - 101 bpm). Rare PACs/PVCs.  Pre-syncope   a. 06/2022 Zio: Predominantly sinus bradycardia with an average heart rate of 53 (40-148).  1 run of NSVT x 17 beats (143 bpm), 19 SVT runs (fastest 148 x 7 beats; longest 11 beats @ 114 bpm).  Rare PVCs/PACs. Triggered events assoc w/ sinus rhythym.  PSVT (paroxysmal supraventricular tachycardia) (HCC)   a. 05/2016 Zio: 9 SVT runs; b. 06/2022 Zio: 19 SVT runs, fastest 148, longest 11 beats.  PVC's (premature ventricular contractions)   Rhinitis   Sepsis due to cellulitis (HCC) 11/28/2021  Severe sepsis (HCC) 11/28/2021  Wears dentures   partial upper and lower  Wears hearing aid in both ears  Past Surgical History: Past Surgical History: Procedure Laterality Date  ADENOIDECTOMY    CATARACT EXTRACTION W/PHACO Left 10/22/2017  Procedure: CATARACT EXTRACTION PHACO AND INTRAOCULAR LENS PLACEMENT (IOC)  LEFT;  Surgeon: Nevada Crane, MD;  Location: [facility];  Service: Ophthalmology;  Laterality: Left;  CATARACT EXTRACTION W/PHACO Right 11/20/2017  Procedure: CATARACT EXTRACTION PHACO AND INTRAOCULAR LENS PLACEMENT (IOC) RIGHT;  Surgeon: Nevada Crane, MD;  Location: [facility];  Service: Ophthalmology;  Laterality: Right;  ESOPHAGOGASTRODUODENOSCOPY (EGD) WITH PROPOFOL N/A 10/31/2016  Procedure: ESOPHAGOGASTRODUODENOSCOPY (EGD) WITH PROPOFOL;  Surgeon: Christena Deem, MD;  Location: [facility];  Service: Endoscopy;  Laterality: N/A;  I & D EXTREMITY Right 11/29/2021  Procedure: IRRIGATION AND DEBRIDEMENT EXTREMITY;  Surgeon: Henrene Dodge, MD;  Location: [facility];  Service: General;  Laterality: Right;  JOINT REPLACEMENT Right 2003  knee  knee meniscus repair Right   LEG WOUND REPAIR / CLOSURE  11/2021  MELANOMA EXCISION Right   ear.  Followed by Dr. Gwen Pounds  PATELLECTOMY Bilateral   TONSILLECTOMY   Watson,Katherine 04/18/2023, 7:43 PM CLINICAL DATA:  Patient with history of dysphagia, intermittent coughing while eating. Noted to have EXAM: MODIFIED BARIUM SWALLOW TECHNIQUE: Different consistencies of barium were administered orally to the patient by the Speech Pathologist. Imaging of the pharynx was performed in the lateral projection. Lynnette Caffey, PA-C was present in the fluoroscopy room during this study, which was supervised and interpreted by Genevive Bi, MD. FLUOROSCOPY: Radiation Exposure Index (as provided by the fluoroscopic device): 4.70 mGy Kerma COMPARISON:  MBS 01/27/22, Esophagram 03/16/23 FINDINGS: Vestibular  Penetration: Seen with thin liquid trials Aspiration: One episode of frank aspiration without cough during trial of thin liquid. Other:  None. IMPRESSION: Modified barium swallow significant for penetration and one episode of aspiration during trials of thin liquid only. Please refer to the Speech Pathologists report for complete details and recommendations. Electronically Signed   By: Genevive Bi M.D.   On: 04/18/2023 17:02   Assessment & Plan:  .Foot pain, left Assessment & Plan: Exam suggestive of gout, but will treat for cellulitis as well .[name] Prednisone taper x 12 days and doxycycline for 7 days.  Protbiotic advised   Other orders -     predniSONE; 4 tablets daily for 3 days,  reduce by 1 tablet every 3 days until gone  on Day 1 , then reduce by 1 tablet daily until gone  Dispense: 30 tablet; Refill: 0 -     Doxycycline Hyclate; Take 1 tablet (100 mg total) by mouth 2 (two) times daily. With a full meal  Dispense: 14 tablet; Refill: 0     I spent 34 minutes on the day of this face to face encounter reviewing patient's  most recent visit with cardiology,  nephrology,  and neurology,  prior relevant surgical and non surgical procedures, recent  labs and imaging studies, counseling on weight  management,  reviewing the assessment and plan with patient, and post visit ordering and reviewing of  diagnostics and therapeutics with patient  .   Follow-up: Return in about 4 weeks (around 07/18/2023).   [name], MD

## 2023-06-28 NOTE — Progress Notes (Signed)
 Cardiology Clinic Note   Date: 06/29/2023 ID: Theodore [name], DOB 02/23/1930, MRN [id number]  Primary Cardiologist:  Belva Boyden, MD  Chief Complaint   Theodore [name] is a 89 y.o. male who presents to the clinic today for routine follow up.   Patient Profile   Theodore [name] is followed by Dr. Gollan for the history outlined below.      Past medical history significant for: CAD. Found on CT 2018. PAD. ABI 10/11/2022: Mild right lower extremity arterial disease, moderate left lower extremity arterial disease. PVCs/PSVT. 14-day ZIO 07/13/2022: HR 40 to 148 bpm, average 53 bpm.  Predominantly sinus bradycardia.  1 run of NSVT lasting 17 beats with max rate 143 bpm.  19 runs of SVT fastest lasting 7 beats with max rate 148 bpm, longest lasting 11 beats average rate 114 bpm.  Rare ectopy.  4 triggered events associated with NSR. Echo 09/05/2022: EF 55 to 60%.  No RWMA.  Mild LVH.  Grade I DD.  Normal RV size/function.  Normal PA pressure, RVSP 32 mmHg.  Mild AI. Hyperlipidemia. Lipid panel 01/04/2023: Direct LDL 72, HDL 65, TG 78, total 152. COPD. GERD. Hypothyroidism. Dementia. CKD stage IIIb. Frequent falls.   In summary, patient establish care with cardiology in 2018 during hospitalization for dyspnea, weakness, chest tightness with findings of sinus bradycardia.  CT performed during hospital admission showed three-vessel coronary calcification.  Echo showed normal LV function.  Stress testing was a low risk study.  Subsequent monitor showed PVCs and sinus rhythm with average heart rates in the low 60s.  In June 2022 patient complained of fatigue, dyspnea, presyncope.  Repeat echo showed normal LV function with Grade I DD.  Outpatient monitoring November 2022 showed an average heart rate of 59 bpm with 15 runs of SVT.  Patient was seen in April 2024 for after near syncopal event during physical therapy.  While using the recumbent bicycle he became diaphoretic and lightheaded.  Event  monitoring showed predominantly sinus bradycardia as detailed above.  Echo showed normal LV/RV function with Grade I DD.  Patient was last seen in the office by Dr. Gollan on 09/25/2022 for follow-up after testing.  He had a recent fall in his bathroom sustaining a laceration to the back of his head requiring staples.  He continued to work with PT and was encouraged to continue his walking program.     History of Present Illness    Today, patient is accompanied by his wife. He reports recent fall 5 days ago. He got up in the middle of the night to get a snack and on his way back to the bedroom he tripped and fell hitting his face on the carpet and possibly hitting the top of his head on something. He did not tell his wife about it until the next morning and states he did not want to be checked out by EMS. When asked about frequency of falls since July patient states he has had 3 falls. However, his wife reports 3 falls just within the last month and many falls since July. She states she has not witnessed his falls but usually gets there right after it happens. Patient was having episodes of dizziness described as the room spinning around him. He reported this to a provider in PCP's office and was referred to vestibular therapy. They contacted patient a few days ago to set it up and patient reported he was no longer having that sensation and did not think he  needed the therapy. He denies chest pain, lightheadedness, dizziness, presyncope or syncope prior to most recent fall.  He ambulates with a rolling walker but wife states he does not always use it in the house. He finished working with PT about 6 months ago. He was attending chair yoga but has not done so for the last month. He is otherwise feeling well. He denies chest pain, pressure or tightness. He reports dyspnea with heavier exertion such as longer walks but none with routine activities. No orthopnea, PND or lower extremity edema.      ROS:  Somewhat limited secondary to patient with dementia.   EKGs/Labs Reviewed    EKG Interpretation Date/Time:  Friday June 29 2023 14:08:15 EDT Ventricular Rate:  57 PR Interval:  156 QRS Duration:  76 QT Interval:  430 QTC Calculation: 418 R Axis:   34  Text Interpretation: Sinus bradycardia When compared with ECG of 25-Sep-2022 11:53, Premature atrial complexes are no longer Present Confirmed by Morey Ar [phone]) on 06/29/2023 2:20:54 PM   05/31/2023: ALT 17; AST 20; BUN 35; Creatinine, Ser 1.63; Potassium 5.0; Sodium 140   05/31/2023: Hemoglobin 13.5; WBC 4.3   05/31/2023: TSH 0.40    Physical Exam    VS:  BP 110/60   Pulse (!) 57   Ht 6\' 3"  (1.905 m)   Wt 161 lb 9.6 oz (73.3 kg)   SpO2 99%   BMI 20.20 kg/m  , BMI Body mass index is 20.2 kg/m.  GEN: Well nourished, well developed, in no acute distress. Neck: No JVD or carotid bruits. Cardiac:  RRR. No murmurs. No rubs or gallops.   Respiratory:  Respirations regular and unlabored. Clear to auscultation without rales, wheezing or rhonchi. GI: Soft, nontender, nondistended. Extremities: Radials/DP/PT 2+ and equal bilaterally. No clubbing or cyanosis. No edema.  Skin: Warm and dry, no rash. Neuro: Strength intact.  Assessment & Plan   Coronary calcification Seen on CT 2018.  Low risk stress test March 2018.  Patient denies chest pain, pressure or tightness.  - Continue Zetia .  PAD ABIs July 2024 demonstrated mild right lower extremity arterial disease and moderate left lower extremity arterial disease.  Patient does not complain about claudication today.  - Continue Zetia .  Bradycardia/PVCs/PSVT 14-day ZIO May 2024 demonstrated predominantly sinus bradycardia HR 40 to 148 bpm, average 53 bpm, 1 run of NSVT lasting 17 beats and 19 runs of SVT lasting up to 11 beats, rare ectopy, triggered events associated with NSR.  Patient denies palpitations, lightheadedness, dizziness, presyncope or syncope.  -Continue to  monitor.   Hyperlipidemia LDL 70 13 December 2022, at goal.  Patient continues to decline statin. - Continue Zetia .  Frequent falls Patient's wife reports 3 falls within the last month and more falls since July 2024. Last fall 5 days ago. Patient got up in the middle of the night for a snack and tripped in the dark living room on his way back to the bedroom sustaining an abrasion on the bridge of his nose and possibly striking the top of his head on something. He did not tell his wife until the next morning and declined being checked out by EMS. He was having bouts of dizziness where he felt the room was spinning. He was evaluated by a provider at his PCP's office and referred for vestibular therapy. When he was contacted to schedule he felt he was not having any further symptoms and did not need therapy. He completed PT 6 months ago. Was attending  chair yoga up until a month ago. He ambulates with a rolling walker but does not always use it in his home. He denies chest pain, palpitations, lightheadedness, dizziness, presyncope or syncope prior to last fall.  -Encouraged to attend chair yoga regularly.  -Consider vestibular therapy if dizziness returns.  -Encouraged to utilize rolling walker all of the time.  -Continue to follow with PCP.   Disposition: Return in 6 months or sooner as needed.          Signed, Lonell Rives. Garrison Michie, DNP, NP-C

## 2023-06-29 ENCOUNTER — Encounter: Payer: Self-pay | Admitting: Student

## 2023-06-29 ENCOUNTER — Ambulatory Visit: Attending: Student | Admitting: Student

## 2023-06-29 VITALS — BP 110/60 | HR 57 | Ht 75.0 in | Wt 161.6 lb

## 2023-06-29 DIAGNOSIS — R001 Bradycardia, unspecified: Secondary | ICD-10-CM | POA: Diagnosis not present

## 2023-06-29 DIAGNOSIS — I2510 Atherosclerotic heart disease of native coronary artery without angina pectoris: Secondary | ICD-10-CM

## 2023-06-29 DIAGNOSIS — I739 Peripheral vascular disease, unspecified: Secondary | ICD-10-CM | POA: Diagnosis not present

## 2023-06-29 DIAGNOSIS — I471 Supraventricular tachycardia, unspecified: Secondary | ICD-10-CM

## 2023-06-29 DIAGNOSIS — I493 Ventricular premature depolarization: Secondary | ICD-10-CM

## 2023-06-29 NOTE — Patient Instructions (Signed)
 Medication Instructions:  Your Physician recommend you continue on your current medication as directed.    *If you need a refill on your cardiac medications before your next appointment, please call your pharmacy*  Lab Work: None If you have labs (blood work) drawn today and your tests are completely normal, you will receive your results only by: MyChart Message (if you have MyChart) OR A paper copy in the mail If you have any lab test that is abnormal or we need to change your treatment, we will call you to review the results.  Testing/Procedures: None  Follow-Up: At [facility], you and your health needs are our priority.  As part of our continuing mission to provide you with exceptional heart care, our providers are all part of one team.  This team includes your primary Cardiologist (physician) and Advanced Practice Providers or APPs (Physician Assistants and Nurse Practitioners) who all work together to provide you with the care you need, when you need it.  Your next appointment:   6 month(s)  Provider:   Timothy Gollan, MD or Morey Ar, NP    Other Instructions Physical Therapy for dizziness - contact number [phone]

## 2023-07-03 ENCOUNTER — Ambulatory Visit: Admitting: Dermatology

## 2023-07-03 DIAGNOSIS — L578 Other skin changes due to chronic exposure to nonionizing radiation: Secondary | ICD-10-CM | POA: Diagnosis not present

## 2023-07-03 DIAGNOSIS — L821 Other seborrheic keratosis: Secondary | ICD-10-CM | POA: Diagnosis not present

## 2023-07-03 DIAGNOSIS — L738 Other specified follicular disorders: Secondary | ICD-10-CM | POA: Diagnosis not present

## 2023-07-03 DIAGNOSIS — L820 Inflamed seborrheic keratosis: Secondary | ICD-10-CM

## 2023-07-03 DIAGNOSIS — Z872 Personal history of diseases of the skin and subcutaneous tissue: Secondary | ICD-10-CM

## 2023-07-03 DIAGNOSIS — W908XXA Exposure to other nonionizing radiation, initial encounter: Secondary | ICD-10-CM

## 2023-07-03 NOTE — Progress Notes (Signed)
   Follow-Up Visit   Subjective  Theodore [name] is a 89 y.o. male who presents for the following: patient reports a spot that has been growing at forehead for several months. He picks at it.    The patient has spots, moles and lesions to be evaluated, some may be new or changing and the patient may have concern these could be cancer.   The following portions of the chart were reviewed this encounter and updated as appropriate: medications, allergies, medical history  Review of Systems:  No other skin or systemic complaints except as noted in HPI or Assessment and Plan.  Objective  Well appearing patient in no apparent distress; mood and affect are within normal limits.   A focused examination was performed of the following areas: Face and scalp  Relevant exam findings are noted in the Assessment and Plan.  right upper forehead x 2, left frontal scalp x 1 (3) Erythematous stuck-on, waxy papule  Assessment & Plan   SEBORRHEIC KERATOSIS - Stuck-on, waxy, tan-brown papules and/or plaques  - Benign-appearing - Discussed benign etiology and prognosis. - Observe - Call for any changes  Sebaceous Hyperplasia At right upper forehead -  3 mm Small yellow papule with a central dell at right upper forehead - Benign-appearing - Observe. Call for changes.    ACTINIC DAMAGE - chronic, secondary to cumulative UV radiation exposure/sun exposure over time - diffuse scaly erythematous macules with underlying dyspigmentation - Recommend daily broad spectrum sunscreen SPF 30+ to sun-exposed areas, reapply every 2 hours as needed.  - Recommend staying in the shade or wearing long sleeves, sun glasses (UVA+UVB protection) and wide brim hats (4-inch brim around the entire circumference of the hat). - Call for new or changing lesions.  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 - these  may recur and new lesions may form requiring treatment to prevent transformation into skin cancer - observe for new or changing spots and contact [facility] for appointment if occur - photoprotection with sun protective clothing; sunglasses and broad spectrum sunscreen with SPF of at least 30 + and frequent self skin exams recommended - yearly exams by a dermatologist recommended for persons with history of PreCancerous Actinic Keratoses  INFLAMED SEBORRHEIC KERATOSIS (3) right upper forehead x 2, left frontal scalp x 1 (3) Symptomatic, irritating, patient would like treated. Destruction of lesion - right upper forehead x 2, left frontal scalp x 1 (3)  Destruction method: cryotherapy   Informed consent: discussed and consent obtained   Lesion destroyed using liquid nitrogen: Yes   Region frozen until ice ball extended beyond lesion: Yes   Outcome: patient tolerated procedure well with no complications   Post-procedure details: wound care instructions given   Additional details:  Prior to procedure, discussed risks of blister formation, small wound, skin dyspigmentation, or rare scar following cryotherapy. Recommend Vaseline ointment to treated areas while healing.   Return as scheduled.  I, Randee Busing, CMA, am acting as scribe for Artemio Larry, MD.   Documentation: I have reviewed the above documentation for accuracy and completeness, and I agree with the above.  Artemio Larry, MD

## 2023-07-03 NOTE — Patient Instructions (Addendum)
 Seborrheic Keratosis  What causes seborrheic keratoses? Seborrheic keratoses are harmless, common skin growths that first appear during adult life.  As time goes by, more growths appear.  Some people may develop a large number of them.  Seborrheic keratoses appear on both covered and uncovered body parts.  They are not caused by sunlight.  The tendency to develop seborrheic keratoses can be inherited.  They vary in color from skin-colored to gray, brown, or even black.  They can be either smooth or have a rough, warty surface.   Seborrheic keratoses are superficial and look as if they were stuck on the skin.  Under the microscope this type of keratosis looks like layers upon layers of skin.  That is why at times the top layer may seem to fall off, but the rest of the growth remains and re-grows.    Treatment Seborrheic keratoses do not need to be treated, but can easily be removed in the office.  Seborrheic keratoses often cause symptoms when they rub on clothing or jewelry.  Lesions can be in the way of shaving.  If they become inflamed, they can cause itching, soreness, or burning.  Removal of a seborrheic keratosis can be accomplished by freezing, burning, or surgery. If any spot bleeds, scabs, or grows rapidly, please return to have it checked, as these can be an indication of a skin cancer.   Cryotherapy Aftercare  Wash gently with soap and water everyday.   Apply Vaseline and Band-Aid daily until healed.      Due to recent changes in healthcare laws, you may see results of your pathology and/or laboratory studies on MyChart before the doctors have had a chance to review them. We understand that in some cases there may be results that are confusing or concerning to you. Please understand that not all results are received at the same time and often the doctors may need to interpret multiple results in order to provide you with the best plan of care or course of treatment. Therefore, we ask  that you please give [place] 2 business days to thoroughly review all your results before contacting the office for clarification. Should we see a critical lab result, you will be contacted sooner.   If You Need Anything After Your Visit  If you have any questions or concerns for your doctor, please call our main line at [phone] and press option 4 to reach your doctor's medical assistant. If no one answers, please leave a voicemail as directed and we will return your call as soon as possible. Messages left after 4 pm will be answered the following business day.   You may also send [place] a message via MyChart. We typically respond to MyChart messages within 1-2 business days.  For prescription refills, please ask your pharmacy to contact our office. Our fax number is [phone].  If you have an urgent issue when the clinic is closed that cannot wait until the next business day, you can page your doctor at the number below.    Please note that while we do our best to be available for urgent issues outside of office hours, we are not available 24/7.   If you have an urgent issue and are unable to reach [place], you may choose to seek medical care at your doctor's office, retail clinic, urgent care center, or emergency room.  If you have a medical emergency, please immediately call 911 or go to the emergency department.  Pager Numbers  - Dr.  Gwen Pounds: [phone]  - Dr. Roseanne Reno: [phone]  - Dr. Katrinka Blazing: [phone]   In the event of inclement weather, please call our main line at [phone] for an update on the status of any delays or closures.  Dermatology Medication Tips: Please keep the boxes that topical medications come in in order to help keep track of the instructions about where and how to use these. Pharmacies typically print the medication instructions only on the boxes and not directly on the medication tubes.   If your medication is too expensive, please contact our office at  [phone] option 4 or send [place] a message through MyChart.   We are unable to tell what your co-pay for medications will be in advance as this is different depending on your insurance coverage. However, we may be able to find a substitute medication at lower cost or fill out paperwork to get insurance to cover a needed medication.   If a prior authorization is required to get your medication covered by your insurance company, please allow [place] 1-2 business days to complete this process.  Drug prices often vary depending on where the prescription is filled and some pharmacies may offer cheaper prices.  The website www.goodrx.com contains coupons for medications through different pharmacies. The prices here do not account for what the cost may be with help from insurance (it may be cheaper with your insurance), but the website can give you the price if you did not use any insurance.  - You can print the associated coupon and take it with your prescription to the pharmacy.  - You may also stop by our office during regular business hours and pick up a GoodRx coupon card.  - If you need your prescription sent electronically to a different pharmacy, notify our office through [facility] or by phone at [phone] option 4.     Si Usted Necesita Algo Despus de Su Visita  Tambin puede enviarnos un mensaje a travs de Clinical cytogeneticist. Por lo general respondemos a los mensajes de MyChart en el transcurso de 1 a 2 das hbiles.  Para renovar recetas, por favor pida a su farmacia que se ponga en contacto con nuestra oficina. Annie Sable de fax es [place] [phone].  Si tiene un asunto urgente cuando la clnica est cerrada y que no puede esperar hasta el siguiente da hbil, puede llamar/localizar a su doctor(a) al nmero que aparece a continuacin.   Por favor, tenga en cuenta que aunque hacemos todo lo posible para estar disponibles para asuntos urgentes fuera del horario de [place], no estamos  disponibles las 24 horas del da, los 7 [address]  [address] de la [place].   Si tiene un problema urgente y no puede comunicarse con nosotros, puede optar por buscar atencin mdica  en el consultorio de su doctor(a), en una clnica privada, en un centro de atencin urgente o en una sala de emergencias.  Si tiene Engineer, drilling, por favor llame inmediatamente al 911 o vaya a la sala de emergencias.  Nmeros de bper  - Dr. Gwen Pounds: [phone]  - Dra. Roseanne Reno: [phone]  - Dr. Katrinka Blazing: [phone]   En caso de inclemencias del tiempo, por favor llame a Lacy Duverney principal al [phone] para una actualizacin sobre el [place] de cualquier retraso o cierre.  Consejos para la medicacin en dermatologa: Por favor, guarde las cajas en las que vienen los medicamentos de uso tpico para ayudarle a seguir las instrucciones sobre dnde y cmo usarlos. Las farmacias generalmente imprimen las instrucciones del  medicamento slo en las cajas y no directamente en los tubos del medicamento.   Si su medicamento es muy caro, por favor, pngase en contacto con Rolm Gala llamando al [phone] y presione la opcin 4 o envenos un mensaje a travs de Clinical cytogeneticist.   No podemos decirle cul ser su copago por los medicamentos por adelantado ya que esto es diferente dependiendo de la cobertura de su seguro. Sin embargo, es posible que podamos encontrar un medicamento sustituto a Audiological scientist un formulario para que el seguro cubra el medicamento que se considera necesario.   Si se requiere una autorizacin previa para que su compaa de seguros [place] su medicamento, por favor permtanos de 1 a 2 das hbiles para completar [address]  Los precios de los medicamentos varan con frecuencia dependiendo del Environmental consultant de dnde se surte la receta y alguna farmacias pueden ofrecer precios ms baratos.  El sitio web www.goodrx.com tiene cupones para medicamentos de Health and safety inspector. Los precios aqu no  tienen en cuenta lo que podra costar con la ayuda del seguro (puede ser ms barato con su seguro), pero el sitio web puede darle el precio si no utiliz Tourist information centre manager.  - Puede imprimir el cupn correspondiente y llevarlo con su receta a la farmacia.  - Tambin puede pasar por nuestra oficina durante el horario de atencin regular y Education officer, museum una tarjeta de cupones de GoodRx.  - Si necesita que su receta se enve electrnicamente a una farmacia diferente, informe a nuestra oficina a travs de MyChart de [place] o por telfono llamando al [phone] y presione la opcin 4.

## 2023-07-12 ENCOUNTER — Other Ambulatory Visit: Payer: Self-pay | Admitting: Internal Medicine

## 2023-07-13 NOTE — Telephone Encounter (Signed)
 Refilled: 03/05/2023 Last OV: 01/04/2023 Next OV: 07/18/2023

## 2023-07-16 ENCOUNTER — Other Ambulatory Visit: Payer: Self-pay | Admitting: Internal Medicine

## 2023-07-18 ENCOUNTER — Ambulatory Visit (INDEPENDENT_AMBULATORY_CARE_PROVIDER_SITE_OTHER): Admitting: Internal Medicine

## 2023-07-18 ENCOUNTER — Encounter: Payer: Self-pay | Admitting: Internal Medicine

## 2023-07-18 VITALS — BP 110/52 | HR 67 | Ht 75.0 in | Wt 163.2 lb

## 2023-07-18 DIAGNOSIS — E440 Moderate protein-calorie malnutrition: Secondary | ICD-10-CM

## 2023-07-18 DIAGNOSIS — R2689 Other abnormalities of gait and mobility: Secondary | ICD-10-CM | POA: Diagnosis not present

## 2023-07-18 DIAGNOSIS — R296 Repeated falls: Secondary | ICD-10-CM

## 2023-07-18 DIAGNOSIS — I700 Atherosclerosis of aorta: Secondary | ICD-10-CM | POA: Diagnosis not present

## 2023-07-18 DIAGNOSIS — G309 Alzheimer's disease, unspecified: Secondary | ICD-10-CM | POA: Diagnosis not present

## 2023-07-18 DIAGNOSIS — I129 Hypertensive chronic kidney disease with stage 1 through stage 4 chronic kidney disease, or unspecified chronic kidney disease: Secondary | ICD-10-CM

## 2023-07-18 DIAGNOSIS — F411 Generalized anxiety disorder: Secondary | ICD-10-CM

## 2023-07-18 DIAGNOSIS — R29898 Other symptoms and signs involving the musculoskeletal system: Secondary | ICD-10-CM | POA: Diagnosis not present

## 2023-07-18 DIAGNOSIS — F0283 Dementia in other diseases classified elsewhere, unspecified severity, with mood disturbance: Secondary | ICD-10-CM

## 2023-07-18 MED ORDER — BUPROPION HCL ER (SR) 150 MG PO TB12: 150.0000 mg | ORAL_TABLET | Freq: Every morning | ORAL | 1 refills | Status: DC

## 2023-07-18 MED ORDER — SERTRALINE HCL 50 MG PO TABS: 75.0000 mg | ORAL_TABLET | Freq: Every day | ORAL | 1 refills | Status: DC

## 2023-07-18 NOTE — Patient Instructions (Signed)
 I'm glad your foot and your knee are feeling better   I have reordered Physical Therapy to work on your leg strength and balance

## 2023-07-18 NOTE — Progress Notes (Signed)
 Subjective:  Patient ID: Theodore [name], male    DOB: Jan 03, 1930  Age: 89 y.o. MRN: [id number]  CC: The primary encounter diagnosis was Bilateral leg weakness. 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   HPI Theodore [name] presents for  Chief Complaint  Patient presents with   Medical Management of Chronic Issues   1) follow up on foot pain: treated for gout AND cellulitis last month.  Does not recall the visit. But reports that his foot pain is non existent.  Sleeping well and gaining weight by home scales. Per [name], he is  eating  a lot of sweets in between meals, and at night  and eating meals too.  " I sleep too much when I do sleep"  sleeps until 11-12 unless he has an appt  .  Going to bed later ,  schedule became hectitic due to family visitaiotns. .  Taking alprazolam  nightly but still slept even when he  ran out of alprazolam  during daughter's visit but medication has been refilled as of May 2   2) cognitive decline: did not tolerate Namenda trial.   Depression/anxiety still  problematic  but tolerating zoloft ,  wellbutrin  and at bedtime alprazolam  .  3) Fear of falling due to recurrent leg weakness.  Can't get out of chair without using his arms and has  fallen several times.  He completed PT 6 months ago through [facility]    Outpatient Medications Prior to Visit  Medication Sig Dispense Refill   ALPRAZolam  (XANAX ) 0.25 MG tablet TAKE 1 TABLET (0.25 MG TOTAL) BY MOUTH AT BEDTIME AS NEEDED FOR ANXIETY OR SLEEP. 30 tablet 3   ascorbic acid  (VITAMIN C ) 500 MG tablet Take 500 mg by mouth daily.     cycloSPORINE  (RESTASIS ) 0.05 % ophthalmic emulsion Place  1 drop into both eyes 2 (two) times daily.     ezetimibe  (ZETIA ) 10 MG tablet TAKE 1 TABLET BY MOUTH EVERY DAY 60 tablet 6   Folic Acid  (FOLATE PO) Take 680 mcg by mouth daily.     levothyroxine  (SYNTHROID ) 100 MCG tablet TAKE 1 TABLET BY MOUTH EVERY DAY BEFORE BREAKFAST 90 tablet 3   mirtazapine  (REMERON ) 7.5 MG tablet TAKE 1 TABLET BY MOUTH EVERYDAY AT BEDTIME 90 tablet 1   Probiotic Product (MISC INTESTINAL FLORA REGULAT) CAPS Take by mouth.     testosterone  cypionate (DEPOTESTOSTERONE CYPIONATE) 200 MG/ML injection INJECT 0.5 MLS (100 MG TOTAL) INTO THE MUSCLE EVERY 14 (FOURTEEN) DAYS. 1 mL 2   vitamin B-12 (CYANOCOBALAMIN ) 500 MCG tablet Take 1 tablet by mouth daily.     buPROPion  (WELLBUTRIN  SR) 150 MG 12 hr tablet Take 1 tablet (150 mg total) by mouth in the morning. 90 tablet 1   predniSONE  (DELTASONE ) 10 MG tablet 4 tablets daily for 3 days,  reduce by 1 tablet every 3 days until gone  on Day 1 , then reduce by 1 tablet daily until gone 30 tablet 0   sertraline  (ZOLOFT ) 50 MG tablet Take 1.5 tablets (75 mg total) by mouth daily. 135 tablet 1   doxycycline  (VIBRA -TABS) 100 MG tablet Take 1 tablet (100 mg total) by mouth 2 (two) times daily. With a full meal 14 tablet 0   ZINC   GLUCONATE PO Take 80 mg by mouth daily.     No facility-administered medications prior to visit.    Review of Systems;  Patient denies headache, fevers, malaise, unintentional weight loss, skin rash, eye pain, sinus congestion and sinus pain, sore throat, dysphagia,  hemoptysis , cough, dyspnea, wheezing, chest pain, palpitations, orthopnea, edema, abdominal pain, nausea, melena, diarrhea, constipation, flank pain, dysuria, hematuria, urinary  Frequency, nocturia, numbness, tingling, seizures,  Focal weakness, Loss of consciousness,  Tremor, insomnia,  and suicidal ideation.      Objective:  BP (!) 110/52   Pulse 67   Ht 6\' 3"  (1.905 m)   Wt 163 lb 3.2 oz (74 kg)   SpO2 98%   BMI 20.40 kg/m   BP Readings  from Last 3 Encounters:  07/18/23 (!) 110/52  06/29/23 110/60  06/20/23 136/70    Wt Readings from Last 3 Encounters:  07/18/23 163 lb 3.2 oz (74 kg)  06/29/23 161 lb 9.6 oz (73.3 kg)  06/20/23 163 lb 9.6 oz (74.2 kg)    Physical Exam Vitals reviewed.  Constitutional:      General: He is not in acute distress.    Appearance: Normal appearance. He is not ill-appearing, toxic-appearing or diaphoretic.  HENT:     Head: Normocephalic.  Eyes:     General: No scleral icterus.       Right eye: No discharge.        Left eye: No discharge.     Conjunctiva/sclera: Conjunctivae normal.  Cardiovascular:     Rate and Rhythm: Normal rate and regular rhythm.     Heart sounds: Normal heart sounds.  Pulmonary:     Effort: Pulmonary effort is normal. No respiratory distress.     Breath sounds: Normal breath sounds.  Musculoskeletal:        General: Normal range of motion.     Cervical back: Normal range of motion.  Skin:    General: Skin is warm and dry.  Neurological:     General: No focal deficit present.     Mental Status: He is alert and oriented to person, place, and time. Mental status is at baseline.  Psychiatric:        Mood and Affect: Mood normal.        Behavior: Behavior normal.        Thought Content: Thought content normal.        Judgment: Judgment normal.     Lab Results  Component Value Date   HGBA1C 5.0 01/04/2023   HGBA1C 4.8 01/17/2021   HGBA1C 4.7 05/28/2018    Lab Results  Component Value Date   CREATININE 1.63 (H) 05/31/2023   CREATININE 1.29 01/04/2023   CREATININE 1.22 06/21/2022    Lab Results  Component Value Date   WBC 4.3 05/31/2023   HGB 13.5 05/31/2023   HCT 39.6 05/31/2023   PLT 229.0 05/31/2023   GLUCOSE 72 05/31/2023   CHOL 152 01/04/2023   TRIG 78.0 01/04/2023   HDL 65.20 01/04/2023   LDLDIRECT 72.0 01/04/2023   LDLCALC 71 01/04/2023   ALT 17 05/31/2023   AST 20 05/31/2023   NA 140 05/31/2023   K 5.0 05/31/2023   CL 104  05/31/2023   CREATININE 1.63 (H) 05/31/2023   BUN 35 (H) 05/31/2023   CO2 29 05/31/2023   TSH 0.40 05/31/2023   PSA 21.68 (H) 02/11/2021   INR 1.4 (H) 11/29/2021   HGBA1C 5.0 01/04/2023   MICROALBUR 0.4 10/18/2022    DG SWALLOW  FUNC OP MEDICARE SPEECH PATH Result Date: 04/18/2023 Table formatting from the original result was not included. 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  Pt had f/u w/ GI and a DG Esophagus 03/2023 w/ "Flash penetration with several episodes of coughing during the  study; Mild Esophageal phase Dysmotility.  No recent CXR per chart since 2023. No recent dxs of pneumonia per pt/Wife.  OF NOTE: pt has a MBSS in 2023 revealing "adequate oropharyngeal abilities" w/ endorsement of "choking sometimes" when eating/drining. General aspiration precautions and strategies were recommended then; no diet change.  Pt and Wife endorsed the swallowing issues have been ongoing for "years now". Clinical Impression Patient presents with Moderate oropharyngeal phase dysphagia per this assessment today. Performance appeared directly related to deconditioning/weakness in setting of Cognitive decline(baseline Dementia) and Malnutrition as well as Advance Age(89y). Pt has a Baseline of Dysphagia per chart w/ self-reported episodes of "coughing" when eating/drinking at home. Wife endorsed also. These issues increase risk for aspiration/aspiration pneumonia as well as impact oral intake overall. Pt  and Wife denied any recent Pulmonary issues; no CXR (since 2023) nor tx for pneumonia noted per chart notes. Oral phase was c/b grossly functional bolus management, mastication of softened solids though min increased time was needed b/f initation of A-P transfer -- suspect impact from Missing Dentition and need for extra mastication time. Slight+ bolus loss into the buccal areas noted intermittently. Premature spillage into the pharynx noted w/ thin liquids. Pt followed directions to use oral clearing strategies b/t trials to manage/clear any oral residue. Swallow initiation appeared to occur along the posterior surface of the epiglottis(+) with thin/nectar liquids; min more timely swallow initiation was noted w/ nectar consistency liquids. Swallow initiation occurred at the valleculae w/ increased textured trials.  Pharyngeal stage was noted for reduced hyolaryngeal excursion and reduced BOT contact/stripping (suspect age related impact). Epiglottic inversion appeared partial w/ decreased airway closure/protection which resulted in Inconsistent, laryngeal Penetration/Aspiration w/ thin liquids before/during the swallow; no overt laryngeal penetration nor aspiration noted w/ nectar liquids. Pt was sensate to the Aspiration  responding w/ a Cough; less sensate to the laryngeal Penetration of thin liquids as well as the pharyngeal residue seeping along the underneath side of the epiglottis into the upper laryngeal vestibule b/t trials. MOD pharyngeal residue remained BOT>pyriform sinuses post-swallows w/ ALL consistencies; passive coating and seepage of this residue noted under the epiglottis and into the upper laryngeal vestibule (trace-min amount). Pt often responded w/ a delayed cough.  Amplitude/duration of PE segment opening appeared grossly [facility] w/ fair bolus clearance noted in viewable Esophageal area.   Consistencies tested were thin liquids via tsp 1, cup sip 3, multiple sips 3; Nectar liquids via tsp 1, cup  sip 1, multiple sips 3; Honey x1 tsp; Puree x1 (1/2) tsp; cookie w/ puree 2 trials.  Swallowing strategies practiced and recommended to pt, then Wife include: lingual sweep followed by a Dry swallow; throat clear/cough intermittently during and POST meals -- these were employed during the study and resulted in reduction of oropharyngeal residue and upper airway clearance of Penetrated material for improved  airway protection overall.  The above reviewed via video and discussed thoroughly w/ pt and Wife. Topics of Aging swallowing and Dysphagia, Aspiration precautions, and Swallowing strategies was discussed; Handouts given. Encouraged pt to remain Mobile for Pulmonary health; increased air movement. Discussion was initiated on the possible need to transition to Nectar consistency liquids in his diet -- but first, continue to monitor for any negative sequelae of penetration/aspiration including decline in Pulmonary status, and discuss pt's GOC/QOL wishes w/ PCP and Wife if making further changes in his diet consistency. Recommend f/u w/ Palliative Care and Dietician for support also. Factors that may increase risk of adverse event in presence of aspiration Roderick Civatte & Jessy Morocco 2021): Reduced cognitive function;Limited mobility;Frail or deconditioned Swallow Evaluation Recommendations Recommendations: PO diet PO Diet Recommendation: a more Dysphagia 3 (Mechanical soft); Thin liquids VIA CUP ONLY - NO STRAWS (Level 0) (easy to chew foods; moistened foods for cohesion). Explore need to transition to Nectar consistency liquids VIA CUP in his diet. Liquid Administration via: Cup; No straw Medication Administration: Whole meds with puree (vs need to Crush in Puree) Supervision: Patient able to self-feed; Intermittent supervision/cueing for swallowing strategies Swallowing strategies: Minimize environmental distractions; Slow rate;Small bites/sips; Multiple dry swallows after each bite/sip; Follow solids with liquids; Lingual  Sweep b/t bites/sips w/ Dry swallow; Cough during (intermittently) and post meals to aid clearing airway Postural changes: Position pt fully upright for meals; Stay upright 30-60 min after meals; Out of bed for meals Oral care recommendations: Oral care BID (2x/day); Oral care before PO; Pt independent with oral care (support) Recommended consults: Consider Palliative care and Dietician for support Caregiver Recommendations:  (CREAM SOUPS; No broth soups); verbal and visual Cues in the home for swallowing strategies; natural food/drink consistencies Treatment Plan Treatment Plan Treatment recommendations: Defer treatment plan to SLP at other venue (see follow-up recommendations) Follow-up recommendations: Home health SLP (vs Outpatient f/u for Dysphagia tx) Recommendations Comment: education; assess benefits of transition to Nectar  consistency liquids in his diet Functional status assessment: Patient has had a recent decline in their functional status and/or demonstrates limited ability to make significant improvements in function in a reasonable and predictable amount of time. Treatment frequency: -- (TBD) Treatment duration: -- (TBD) Interventions: Aspiration precaution training; Compensatory techniques; Patient/family education (Strategies) Recommendations Recommendations for follow up therapy are one component of a multi-disciplinary discharge planning process, led by the attending physician.  Recommendations may be updated based on patient status, additional functional criteria and insurance authorization. Assessment: Orofacial Exam: Orofacial Exam Oral Cavity: Oral Hygiene: WFL (min dry?) Oral Cavity - Dentition: Missing dentition (Denture plate lower) Orofacial Anatomy: WFL Oral Motor/Sensory Function: WFL (for strength/ROM; though min decreased rapid lingual movements; min decreased articulation in his speech noted also (baseline per Wife/pt)) Anatomy: Anatomy: Suspected cervical osteophytes (min) Boluses  Administered: Boluses Administered Boluses Administered: Thin liquids (Level 0); Mildly thick liquids (Level 2, nectar thick); Moderately thick liquids (Level 3, honey thick); Puree; Solid  Oral Impairment Domain: Oral Impairment Domain Lip Closure: No labial escape Tongue control during bolus hold: Escape to lateral buccal cavity/floor of mouth (intermittent w/ liquids) Bolus preparation/mastication: Slow prolonged chewing/mashing with complete recollection (w/ softened solids) Bolus transport/lingual motion: Brisk tongue motion (WFL - min slower w/ A-P transfer effort w/ soft solid trials) Oral residue: Trace residue lining oral structures Location of oral residue : Tongue Initiation of pharyngeal swallow : Posterior laryngeal surface of the epiglottis (w/ liquids; Valleculae w/ increased textures)  Pharyngeal Impairment Domain: Pharyngeal Impairment Domain Soft  palate elevation: No bolus between soft palate (SP)/pharyngeal wall (PW) Laryngeal elevation: Partial superior movement of thyroid  cartilage/partial approximation of arytenoids to epiglottic petiole Anterior hyoid excursion: Partial anterior movement Epiglottic movement: Partial inversion Laryngeal vestibule closure: Incomplete, narrow column air/contrast in laryngeal vestibule Pharyngeal stripping wave : Present - diminished Pharyngeal contraction (A/P view only): N/A Pharyngoesophageal segment opening: Complete distension and complete duration, no obstruction of flow Tongue base retraction: Trace column of contrast or air between tongue base and PPW Pharyngeal residue: Collection of residue within or on pharyngeal structures Location of pharyngeal residue: Tongue base; Valleculae; Pharyngeal wall; Aryepiglottic folds; Pyriform sinuses  Esophageal Impairment Domain: Esophageal Impairment Domain Esophageal clearance upright position: Complete clearance, esophageal coating (in viewable area) Pill: Pill Consistency administered: -- (n/a)  Penetration/Aspiration Scale Score: Penetration/Aspiration Scale Score 1.  Material does not enter airway: Moderately thick liquids (Level 3, honey thick); Puree; Solid 3.  Material enters airway, remains ABOVE vocal cords and not ejected out: Thin liquids (Level 0); Mildly thick liquids (Level 2, nectar thick) 5.  Material enters airway, CONTACTS cords and not ejected out: Thin liquids (Level 0) (x1) Compensatory Strategies: Compensatory Strategies Compensatory strategies: Yes Multiple swallows: Effective (to help reduce pharyngeal residue) Other(comment): Effective (Cough aided clearing/reducing material in the larygeal vestibule) Effective Other(comment): Thin liquid (Level 0) (utilized w/)   General Information: Caregiver present: Yes (Wife)  Diet Prior to this Study: Regular; Thin liquids (Level 0) (soft foods at home)   No data recorded  Respiratory Status: WFL   Supplemental O2: None (Room air)   History of Recent Intubation: No  Behavior/Cognition: Alert; Cooperative; Pleasant mood; Distractible; Requires cueing (cues needed intermittently) Self-Feeding Abilities: Able to self-feed (setup) Baseline vocal quality/speech: Normal Volitional Cough: Able to elicit Volitional Swallow: Able to elicit Exam Limitations: No limitations Goal Planning: Prognosis for improved oropharyngeal function: Fair Barriers to Reach Goals: Cognitive deficits; Time post onset; Severity of deficits Barriers/Prognosis Comment: Baseline Dysphagia; Advanced Age Patient/Family Stated Goal: to learn more about the swallowing strategies to implement daily Consulted and agree with results and recommendations: Patient; Family member/caregiver (Wife) Pain: Pain Assessment Pain Assessment: No/denies pain End of Session: Start Time:SLP Start Time (ACUTE ONLY): 1300 Stop Time: SLP Stop Time (ACUTE ONLY): 1430 Time Calculation:SLP Time Calculation (min) (ACUTE ONLY): 90 min Charges: SLP Evaluations $ SLP Speech Visit: 1 Visit SLP Evaluations $MBS  Swallow: 1 Procedure SLP visit diagnosis: SLP Visit Diagnosis: Dysphagia, oropharyngeal phase (R13.12) (baseline Dementia; advanced age) Past Medical History: Past Medical History: Diagnosis Date  Actinic keratosis   Arthritis   knees  Asymptomatic Sinus bradycardia   a. 05/2016 Zio: Avg HR 61 (41-167).  Benign essential tremor   Cancer (HCC) 2002  melanoma right ear  Colon polyp   COPD (chronic obstructive pulmonary disease) (HCC)   pt said he believes it is a misdiagnosis   Coronary artery calcification seen on CT scan   a. 06/2016 CTA Chest: cor Ca2+; b. 05/2017 MV: EF >65%. No ischemia/infarct.  COVID-19 virus infection 04/06/2020  Depression   Dysplastic nevus 04/25/2022  Left lateral neck, severe, shave removal 06/05/22  History of echocardiogram   a. 04/2016 Echo: EF 60-65%, mild conc LVH. Nl PASP.  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 05/2016 Zio: Avg HR 61 (41-167). 9 SVT runs (fastest 167 - 5 beats; longest 8 beats - 101 bpm). Rare PACs/PVCs.  Pre-syncope   a. 06/2022 Zio: Predominantly sinus bradycardia with an average heart rate of 53 (40-148).  1 run of NSVT x 17 beats (143 bpm), 19 SVT runs (fastest 148 x 7 beats; longest 11 beats @ 114 bpm).  Rare PVCs/PACs. Triggered events assoc w/ sinus rhythym.  PSVT (paroxysmal supraventricular tachycardia) (HCC)   a. 05/2016 Zio: 9 SVT runs; b. 06/2022 Zio: 19 SVT runs, fastest 148, longest 11 beats.  PVC's (premature ventricular contractions)   Rhinitis   Sepsis due to cellulitis (HCC) 11/28/2021  Severe sepsis (HCC) 11/28/2021  Wears dentures   partial upper and lower  Wears hearing aid in both ears  Past Surgical History: Past Surgical History:  Procedure Laterality Date  ADENOIDECTOMY    CATARACT EXTRACTION W/PHACO Left 10/22/2017  Procedure: CATARACT EXTRACTION PHACO AND INTRAOCULAR LENS PLACEMENT (IOC)  LEFT;  Surgeon: Rosa College, MD;  Location: [facility];  Service: Ophthalmology;  Laterality: Left;  CATARACT EXTRACTION W/PHACO Right 11/20/2017  Procedure: CATARACT EXTRACTION PHACO AND INTRAOCULAR LENS PLACEMENT (IOC) RIGHT;  Surgeon: Rosa College, MD;  Location: [facility];  Service: Ophthalmology;  Laterality: Right;  ESOPHAGOGASTRODUODENOSCOPY (EGD) WITH PROPOFOL  N/A 10/31/2016  Procedure: ESOPHAGOGASTRODUODENOSCOPY (EGD) WITH PROPOFOL ;  Surgeon: Deveron Fly, MD;  Location: [facility];  Service: Endoscopy;  Laterality: N/A;  I & D EXTREMITY Right 11/29/2021  Procedure: IRRIGATION AND DEBRIDEMENT EXTREMITY;  Surgeon: Emmalene Hare, MD;  Location: [facility] ORS;  Service: General;  Laterality: Right;  JOINT REPLACEMENT Right 2003  knee  knee meniscus repair Right   LEG WOUND REPAIR / CLOSURE  11/2021  MELANOMA EXCISION Right   ear. Followed by Dr. Bary Likes  PATELLECTOMY Bilateral   TONSILLECTOMY   Watson,Katherine 04/18/2023, 7:43 PM CLINICAL DATA:  Patient with history of dysphagia, intermittent coughing while eating. Noted to have EXAM: MODIFIED BARIUM SWALLOW TECHNIQUE: Different consistencies of barium were administered orally to the patient by the Speech Pathologist. Imaging of the pharynx was performed in the lateral projection. Nathan Bake, PA-C was present in the fluoroscopy room during this study, which was supervised and interpreted by Deboraha Fallow, MD. FLUOROSCOPY: Radiation Exposure Index (as provided by the fluoroscopic device): 4.70 mGy Kerma COMPARISON:  MBS 01/27/22, Esophagram 03/16/23 FINDINGS: Vestibular  Penetration: Seen with thin liquid trials Aspiration: One episode of frank aspiration without cough during trial of thin liquid. Other:  None. IMPRESSION: Modified barium swallow  significant for penetration and one episode of aspiration during trials of thin liquid only. Please refer to the Speech Pathologists report for complete details and recommendations. Electronically Signed   By: Deboraha Fallow M.D.   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 Renal function stable, no changes today.   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 Continue zoloft  and alprazolam     Protein-calorie malnutrition, moderate (HCC) Assessment & Plan: Weight loss has stopped and he is gaining weight with use of Remeron     Thoracic aortic atherosclerosis [facility]) Assessment & Plan: Patient is tolerating rosuvastatin  every other day,  and zetia  daily   Lab Results  Component Value Date   CHOL 152 01/04/2023   HDL 65.20 01/04/2023   LDLCALC 71 01/04/2023   LDLDIRECT 72.0 01/04/2023   TRIG 78.0 01/04/2023   CHOLHDL 2 01/04/2023      Other orders -     buPROPion  HCl ER (SR); Take 1 tablet (150 mg total) by mouth in the morning.  Dispense: 90 tablet; Refill: 1 -     Sertraline  HCl; Take 1.5 tablets (75 mg total) by mouth daily.  Dispense: 135 tablet; Refill: 1     I spent 34 minutes on the day of this face to face encounter  reviewing patient's  most recent visit with neurology,  prior relevant surgical and non surgical procedures, recent  labs and imaging studies, counseling on nutritional needs  and anxiety management,   reviewing the assessment and plan with patient, and post visit ordering and reviewing of  diagnostics and therapeutics with patient  .   Follow-up: No follow-ups on file.   Thersia Flax, MD

## 2023-07-20 DIAGNOSIS — D164 Benign neoplasm of bones of skull and face: Secondary | ICD-10-CM | POA: Diagnosis not present

## 2023-07-20 DIAGNOSIS — H903 Sensorineural hearing loss, bilateral: Secondary | ICD-10-CM | POA: Diagnosis not present

## 2023-07-20 DIAGNOSIS — H6123 Impacted cerumen, bilateral: Secondary | ICD-10-CM | POA: Diagnosis not present

## 2023-07-20 NOTE — Assessment & Plan Note (Signed)
 Weight loss has stopped and he is gaining weight with use of Remeron 

## 2023-07-20 NOTE — Assessment & Plan Note (Signed)
Aggravated by dementia. Continue zoloft and alprazolam

## 2023-07-20 NOTE — Assessment & Plan Note (Signed)
 He did not tolerate Nemantine given multiple adverse side effecsts

## 2023-07-20 NOTE — Assessment & Plan Note (Signed)
 Patient is tolerating rosuvastatin  every other day,  and zetia  daily   Lab Results  Component Value Date   CHOL 152 01/04/2023   HDL 65.20 01/04/2023   LDLCALC 71 01/04/2023   LDLDIRECT 72.0 01/04/2023   TRIG 78.0 01/04/2023   CHOLHDL 2 01/04/2023

## 2023-07-20 NOTE — Assessment & Plan Note (Signed)
 Well controlled on current regimen. Renal function stable, no changes today.   Lab Results  Component Value Date   CREATININE 1.63 (H) 05/31/2023   Lab Results  Component Value Date   NA 140 05/31/2023   K 5.0 05/31/2023   CL 104 05/31/2023   CO2 29 05/31/2023

## 2023-07-24 ENCOUNTER — Encounter: Payer: Self-pay | Admitting: Podiatry

## 2023-07-24 ENCOUNTER — Ambulatory Visit: Payer: PPO | Admitting: Podiatry

## 2023-07-24 DIAGNOSIS — M2042 Other hammer toe(s) (acquired), left foot: Secondary | ICD-10-CM | POA: Diagnosis not present

## 2023-07-24 DIAGNOSIS — B351 Tinea unguium: Secondary | ICD-10-CM | POA: Diagnosis not present

## 2023-07-24 DIAGNOSIS — M2041 Other hammer toe(s) (acquired), right foot: Secondary | ICD-10-CM

## 2023-07-24 DIAGNOSIS — M79675 Pain in left toe(s): Secondary | ICD-10-CM

## 2023-07-24 DIAGNOSIS — M79674 Pain in right toe(s): Secondary | ICD-10-CM | POA: Diagnosis not present

## 2023-07-24 NOTE — Progress Notes (Signed)
 Chief Complaint  Patient presents with   Nail Problem    "He's here for a follow-up."    SUBJECTIVE Patient presents to office today complaining of elongated, thickened nails that cause pain while ambulating in shoes.  Patient is unable to trim their own nails.  Patient continues to have a preulcerative callus lesion overlying the DIPJ of the left third toe.  Patient is here for further evaluation and treatment.  Past Medical History:  Diagnosis Date   Actinic keratosis    Arthritis    knees   Asymptomatic Sinus bradycardia    a. 05/2016 Zio: Avg HR 61 (41-167).   Benign essential tremor    Cancer (HCC) 2002   melanoma right ear   Colon polyp    COPD (chronic obstructive pulmonary disease) (HCC)    pt said he believes it is a misdiagnosis    Coronary artery calcification seen on CT scan    a. 06/2016 CTA Chest: cor Ca2+; b. 05/2017 MV: EF >65%. No ischemia/infarct.   COVID-19 virus infection 04/06/2020   Depression    Dysplastic nevus 04/25/2022   Left lateral neck, severe, shave removal 06/05/22   History of echocardiogram    a. 04/2016 Echo: EF 60-65%, mild conc LVH. Nl PASP.   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 05/2016 Zio: Avg HR 61 (41-167). 9 SVT runs (fastest 167 - 5 beats; longest 8 beats - 101 bpm). Rare PACs/PVCs.   Pre-syncope    a. 06/2022 Zio: Predominantly sinus bradycardia with an average heart rate of 53 (40-148).  1 run of NSVT x 17 beats (143 bpm), 19 SVT runs (fastest 148 x 7 beats; longest 11 beats @ 114 bpm).  Rare PVCs/PACs. Triggered events assoc w/ sinus rhythym.   PSVT (paroxysmal supraventricular tachycardia)  (HCC)    a. 05/2016 Zio: 9 SVT runs; b. 06/2022 Zio: 19 SVT runs, fastest 148, longest 11 beats.   PVC's (premature ventricular contractions)    Rhinitis    Sepsis due to cellulitis (HCC) 11/28/2021   Severe sepsis (HCC) 11/28/2021   Wears dentures    partial upper and lower   Wears hearing aid in both ears     Allergies  Allergen Reactions   Penicillins Rash and Other (See Comments)    Other reaction(s): Other (see comments) Other reaction(s): UNKNOWN   Benzonatate      Pruritic rash   Lexapro  [Escitalopram ] Other (See Comments)    Increased hand tremor   Molnupiravir      Pruritic rash on neck      OBJECTIVE General Patient is awake, alert, and oriented x 3 and in no acute distress. Derm Skin is dry and supple bilateral. Negative open lesions or macerations. Remaining integument unremarkable. Nails are tender, long, thickened and dystrophic with subungual debris, consistent with onychomycosis, 1-5 bilateral. No signs of infection noted. Callus lesion noted overlying the DIPJ of the left third toe.  After debridement there is a superficial wound measuring approximately 0.2 x 0.2 x 0.1 cm.  Partial thickness limited to breakdown of the skin.  Vasc  DP and PT pedal pulses palpable bilaterally. Temperature gradient within normal limits.  Neuro grossly intact via light touch Musculoskeletal Exam hammertoe deformity noted to the lesser digits bilateral  ASSESSMENT 1.  Pain due to onychomycosis of toenails both 2.  Superficial wound DIPJ left third toe 3.  Hammertoe contracture lesser digits bilateral  PLAN OF CARE -Patient evaluated today.  -Today we discussed the pathology and etiology of the hammertoes causing excessive pressure to the distal tips of the toes.  The patient is not interested in surgical intervention at this time.  Recommend conservative care including good supportive tennis shoes and the bicycles going barefoot. -Instructed to maintain good pedal hygiene and foot  care.  -Mechanical debridement of nails 1-5 bilaterally performed using a nail nipper. Filed with dremel without incident.  -Light debridement of the superficial wound was performed today using a tissue nipper.  Recommend triple antibiotic and a Band-Aid daily to protect the toe from the shoes  -Return to clinic in 3 mos. routine footcare   Dot Gazella, DPM [facility]  Dr. Dot Gazella, DPM    [address] [place], [place] [address]                Office [phone]  Fax [phone]

## 2023-07-31 ENCOUNTER — Encounter (INDEPENDENT_AMBULATORY_CARE_PROVIDER_SITE_OTHER): Payer: Self-pay

## 2023-08-02 DIAGNOSIS — M6281 Muscle weakness (generalized): Secondary | ICD-10-CM | POA: Diagnosis not present

## 2023-08-02 DIAGNOSIS — R2681 Unsteadiness on feet: Secondary | ICD-10-CM | POA: Diagnosis not present

## 2023-08-02 DIAGNOSIS — R296 Repeated falls: Secondary | ICD-10-CM | POA: Diagnosis not present

## 2023-08-14 DIAGNOSIS — M6281 Muscle weakness (generalized): Secondary | ICD-10-CM | POA: Diagnosis not present

## 2023-08-14 DIAGNOSIS — R296 Repeated falls: Secondary | ICD-10-CM | POA: Diagnosis not present

## 2023-08-14 DIAGNOSIS — R2681 Unsteadiness on feet: Secondary | ICD-10-CM | POA: Diagnosis not present

## 2023-08-24 ENCOUNTER — Other Ambulatory Visit: Payer: Self-pay | Admitting: Internal Medicine

## 2023-08-24 MED ORDER — QUETIAPINE FUMARATE 50 MG PO TABS: 50.0000 mg | ORAL_TABLET | Freq: Every day | ORAL | 1 refills | Status: DC

## 2023-09-17 DIAGNOSIS — R2681 Unsteadiness on feet: Secondary | ICD-10-CM | POA: Diagnosis not present

## 2023-09-17 DIAGNOSIS — M6281 Muscle weakness (generalized): Secondary | ICD-10-CM | POA: Diagnosis not present

## 2023-09-17 DIAGNOSIS — R296 Repeated falls: Secondary | ICD-10-CM | POA: Diagnosis not present

## 2023-09-18 IMAGING — CR DG CHEST 2V
1 series · 2 of 2 positions shown · non-contrast
Comparison: 01/03/2021

CLINICAL DATA: Dyspnea

EXAM:
CHEST - 2 VIEW

[Series 1: w chest pa · 0.14mm/px · 2 of 2 slices shown]
[im 1/2]
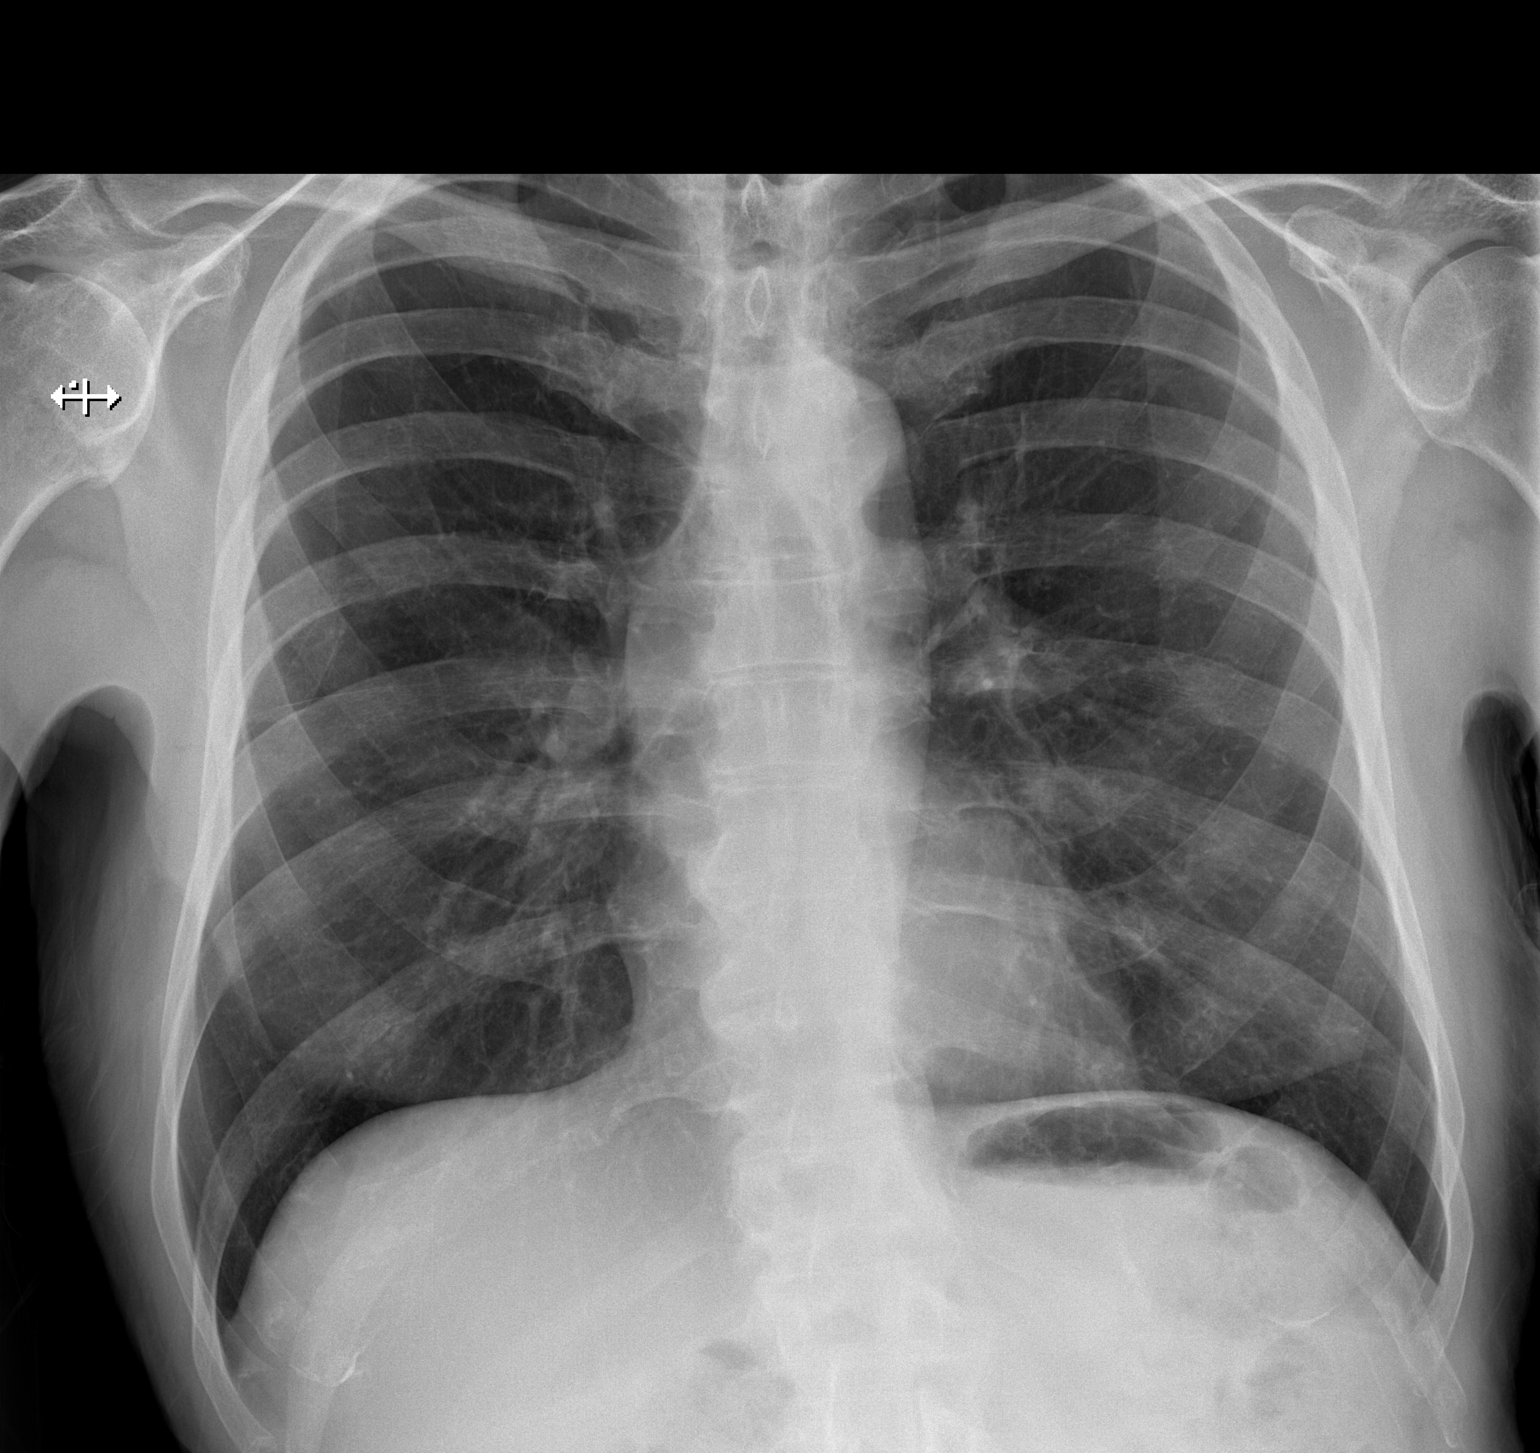
[im 2/2]
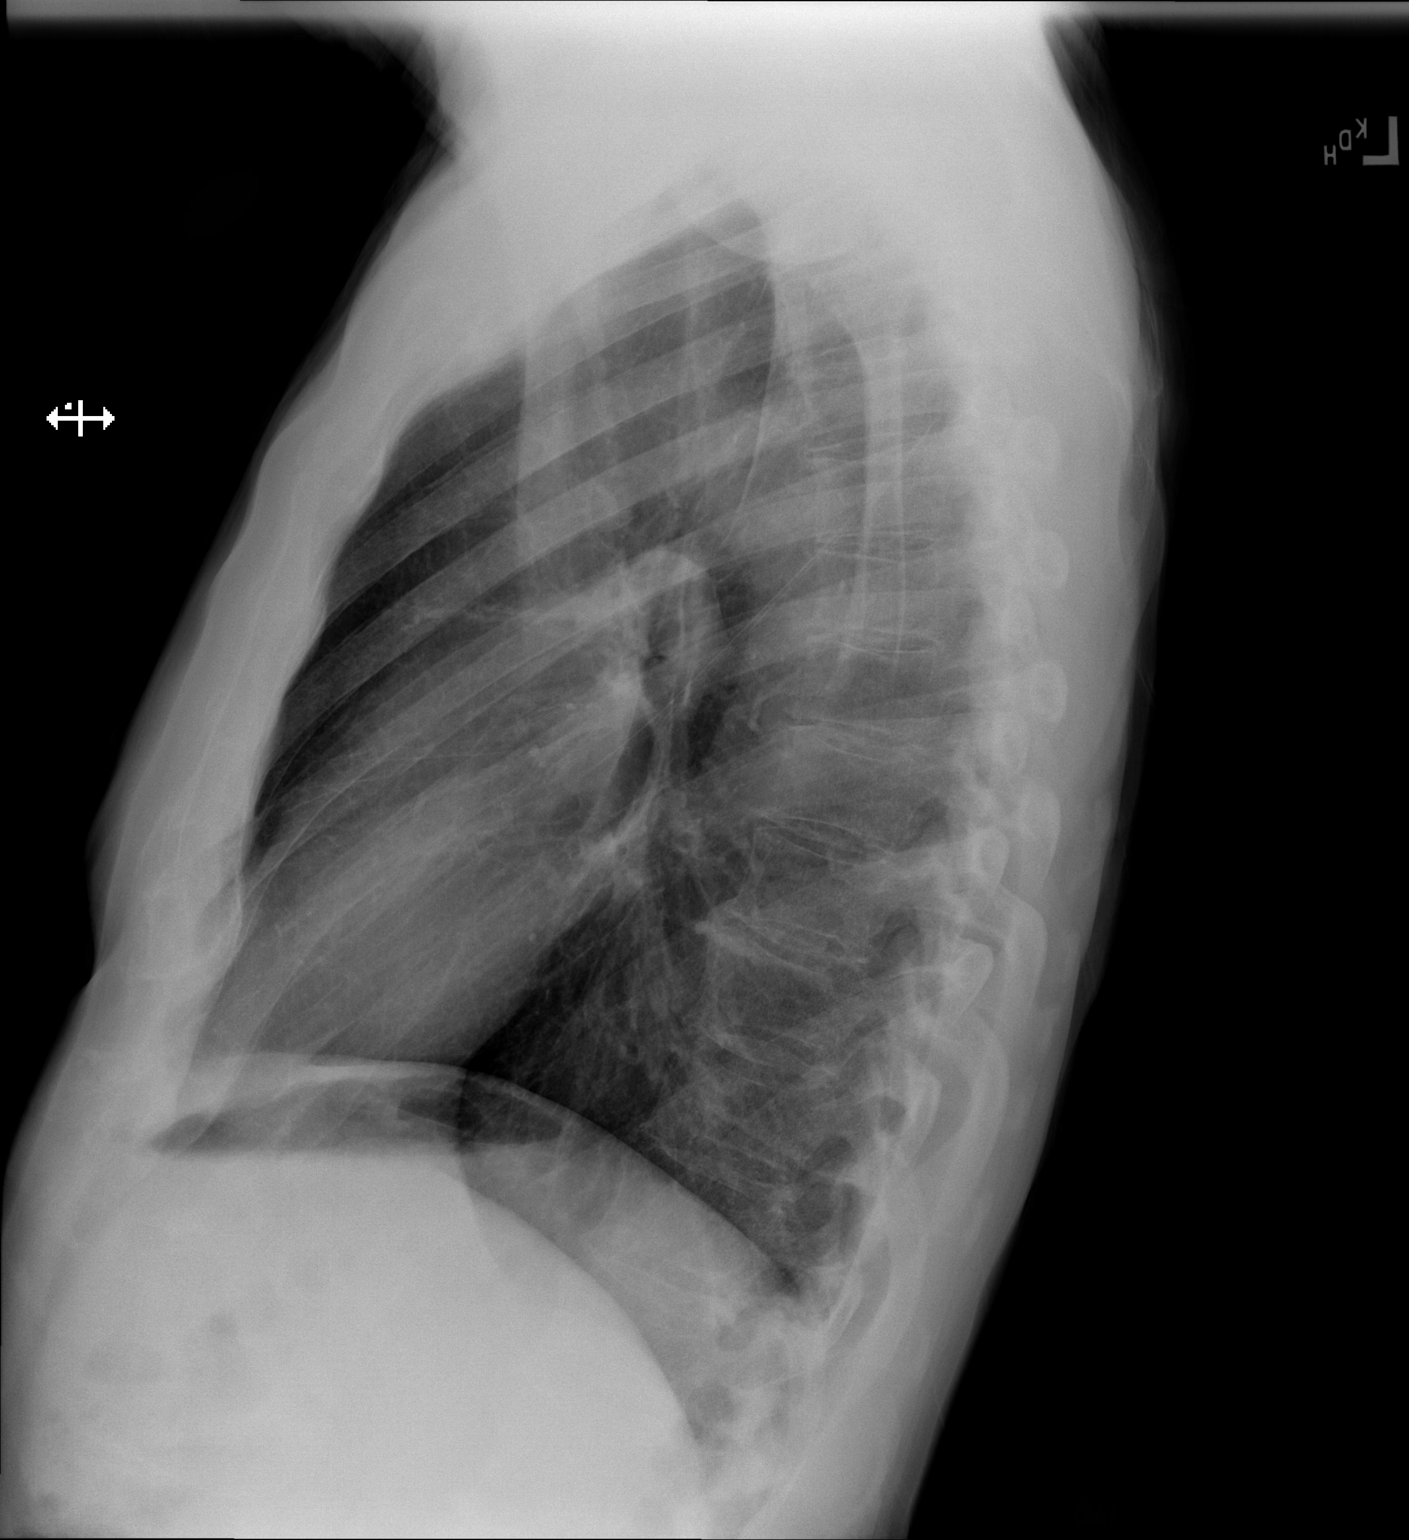

[2 of 2 positions shown; findings below may reference images not displayed]

FINDINGS: The heart size and mediastinal contours are within normal limits.
Both lungs are clear. The visualized skeletal structures are
unremarkable.
IMPRESSION: No active cardiopulmonary disease.

## 2023-09-19 IMAGING — MR MR HEAD W/O CM
12 series · 48 of 48 positions shown · non-contrast
Comparison: Head CT 06/13/2019.  Brain MRI 02/10/2019.

CLINICAL DATA: Essential tremor. Additional history provided by
scanning technologist: Tremors, hearing loss, weakness, unsteady
gait.

EXAM:
MRI HEAD WITHOUT CONTRAST
TECHNIQUE: Multiplanar, multiecho pulse sequences of the brain and surrounding
structures were obtained without intravenous contrast.

[Series 5: T1 · sagittal · 4.0mm · 0.75mm/px · 2 of 32 slices shown (1 of 2)]
[im 1/32]
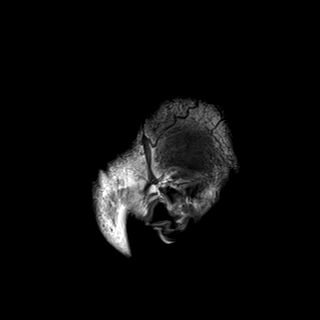
[im 32/32]
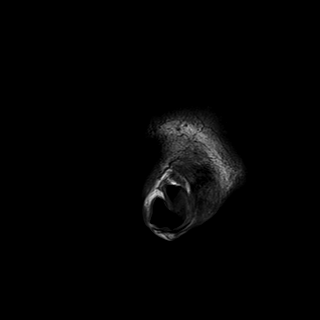

[Series 6: DWI · axial · 3.0mm · 0.94mm/px · z∈[-21,+113]mm · 10 of 165 slices shown (1 of 3)]
[im 1/165]
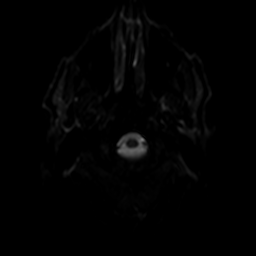
[im 19/165]
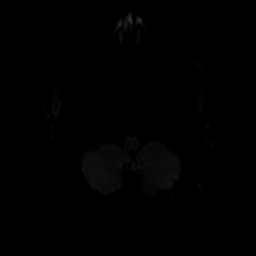
[im 37/165]
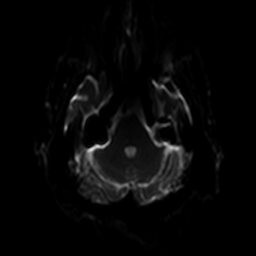
[im 55/165]
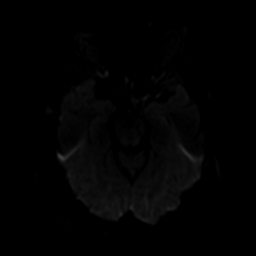
[im 73/165]
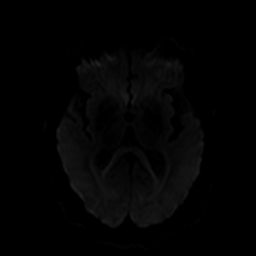
[im 92/165]
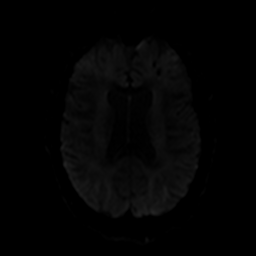
[im 110/165]
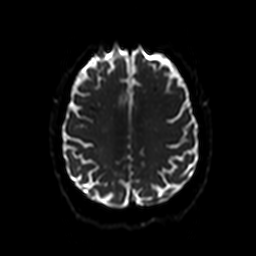
[im 128/165]
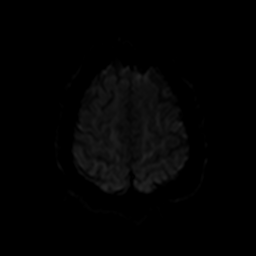
[im 146/165]
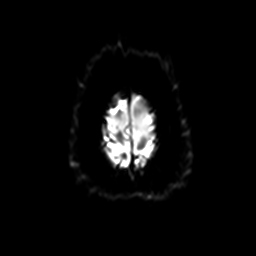
[im 165/165]
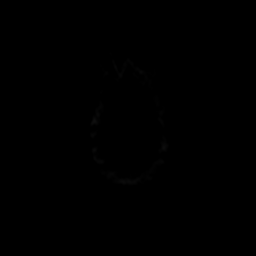

[Series 7: ax dwi_tracew · axial · 3.0mm · 0.94mm/px · z∈[-21,+113]mm · 5 of 84 slices shown]
[im 1/84]
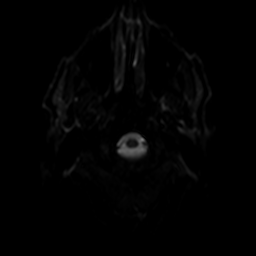
[im 21/84]
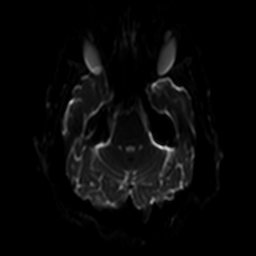
[im 42/84]
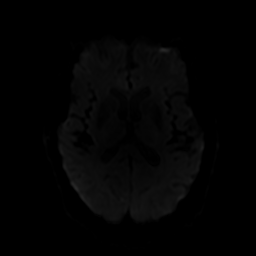
[im 63/84]
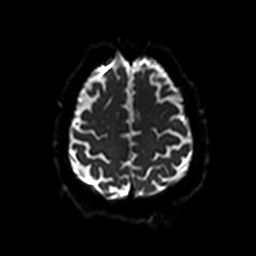
[im 84/84]
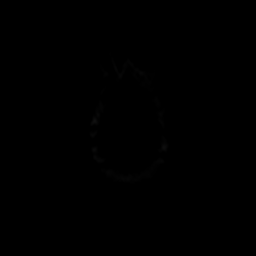

[Series 8: ax dwi_adc · axial · 3.0mm · 0.94mm/px · z∈[-21,+113]mm · 3 of 42 slices shown]
[im 1/42]
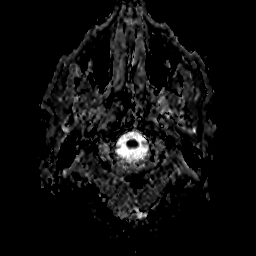
[im 21/42]
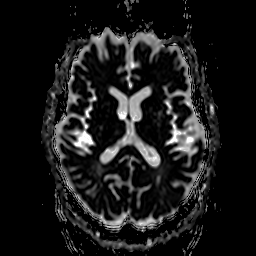
[im 42/42]
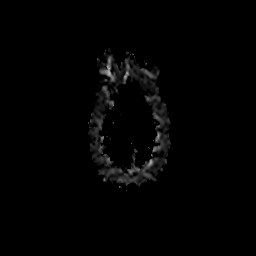

[Series 9: DWI · coronal · 5.0mm · 1.44mm/px · 4 of 68 slices shown (2 of 3)]
[im 1/68]
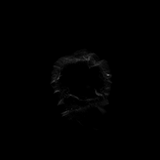
[im 23/68]
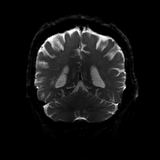
[im 45/68]
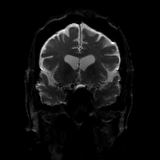
[im 68/68]
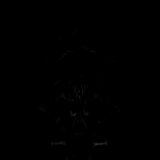

[Series 10: DWI · coronal · 5.0mm · 1.44mm/px · 2 of 34 slices shown (3 of 3)]
[im 1/34]
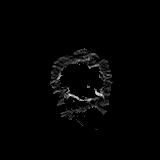
[im 34/34]
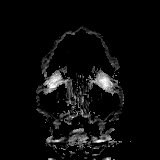

[Series 11: T2 · axial · 4.0mm · 0.36mm/px · z∈[-24,+112]mm · 2 of 30 slices shown (1 of 2)]
[im 1/30]
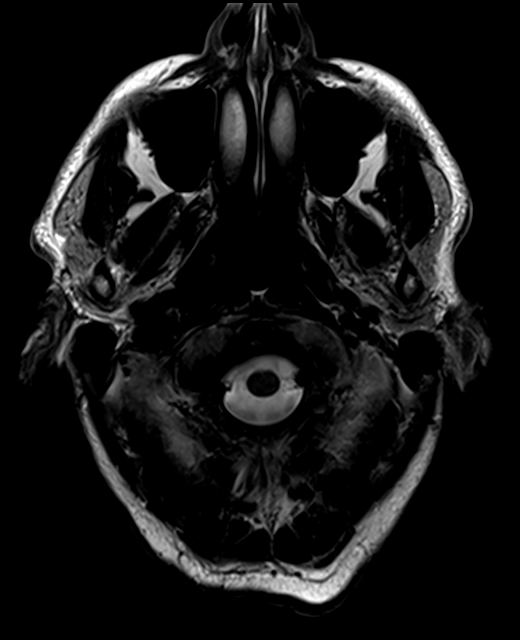
[im 30/30]
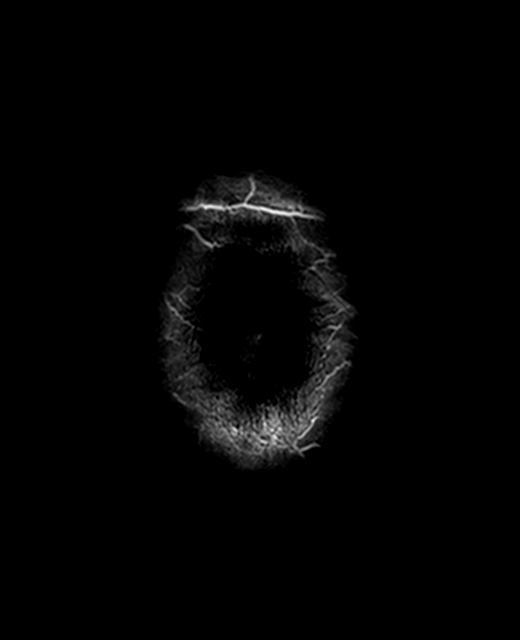

[Series 12: FLAIR · axial · 3.0mm · 0.72mm/px · z∈[-24,+112]mm · 2 of 26 slices shown]
[im 1/26]
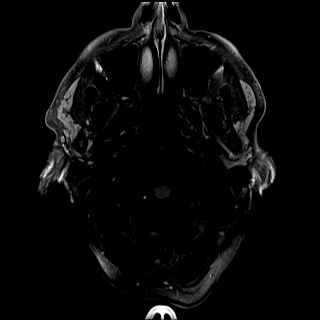
[im 26/26]
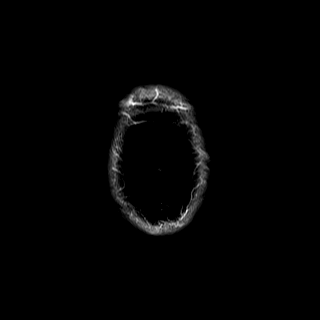

[Series 13: mip_images(sw) · axial · 24.0mm · 0.90mm/px · z∈[-15,+104]mm · 3 of 45 slices shown]
[im 1/45]
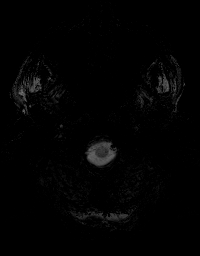
[im 23/45]
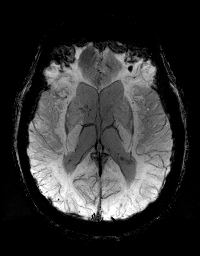
[im 45/45]
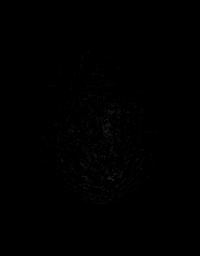

[Series 14: swi_images · axial · 3.0mm · 0.90mm/px · z∈[-25,+113]mm · 3 of 52 slices shown]
[im 1/52]
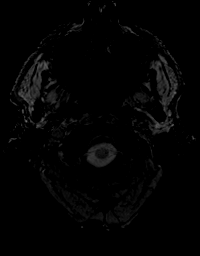
[im 26/52]
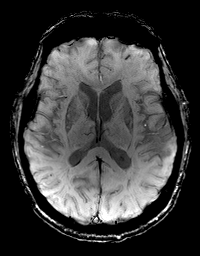
[im 52/52]
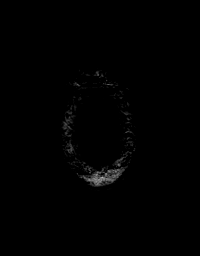

[Series 15: T1 · axial · 1.0mm · 0.90mm/px · z∈[-41,+106]mm · 10 of 160 slices shown (2 of 2)]
[im 1/160]
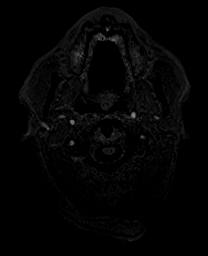
[im 18/160]
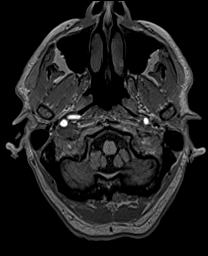
[im 36/160]
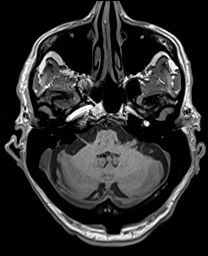
[im 54/160]
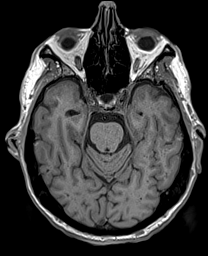
[im 71/160]
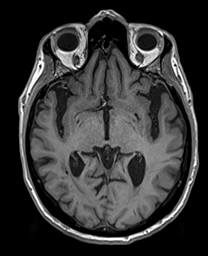
[im 89/160]
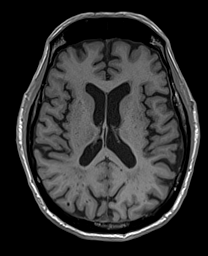
[im 107/160]
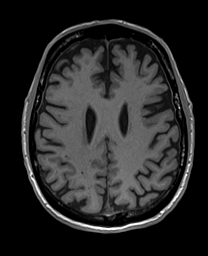
[im 124/160]
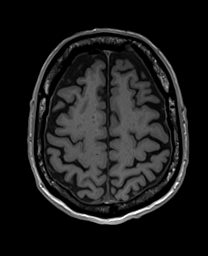
[im 142/160]
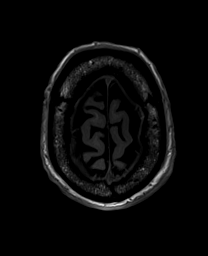
[im 160/160]
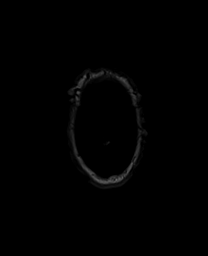

[Series 16: T2 · coronal · 4.5mm · 0.36mm/px · 2 of 33 slices shown (2 of 2)]
[im 1/33]
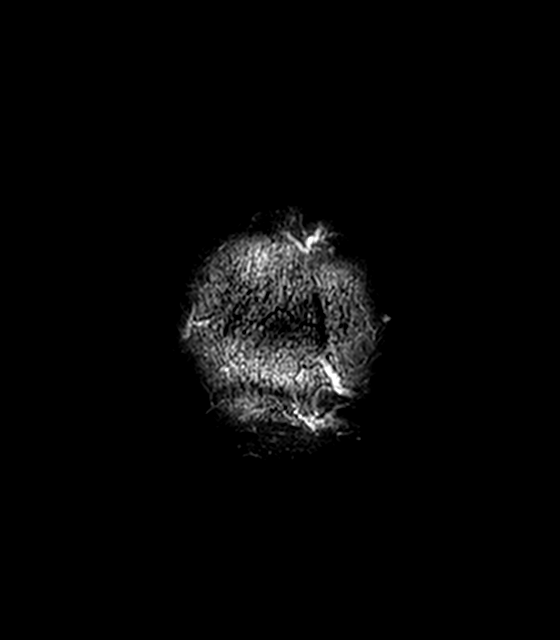
[im 33/33]
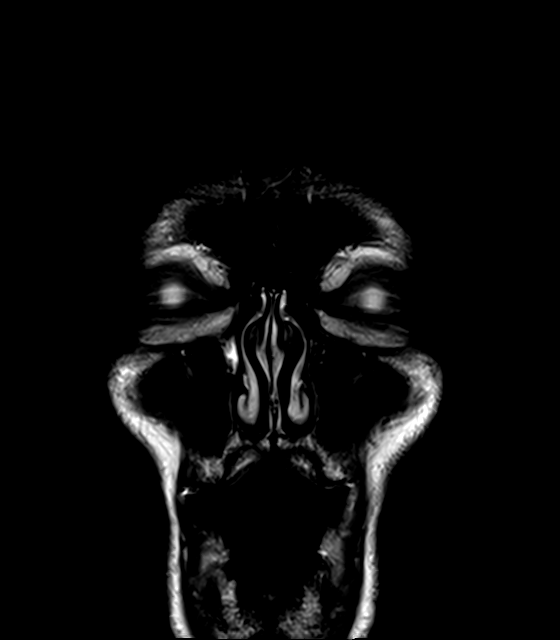

[48 of 48 positions shown; findings below may reference images not displayed]

FINDINGS: Brain:

Mild generalized cerebral and cerebellar atrophy.

Mild multifocal T2 FLAIR hyperintense signal abnormality within the
cerebral white matter, nonspecific but compatible with chronic small
vessel ischemic disease.

Scattered curvilinear foci of SWI signal loss along the bilateral
frontal lobes, compatible with sequela of remote subarachnoid
hemorrhage. Findings appear to have been present on the prior brain
MRI of 02/10/2019, but are seen to better advantage on today's
study.

Additionally, there are scattered supratentorial chronic parenchymal
microhemorrhages.

A small T2 hyperintense focus within the left thalamus appears to
reflect a prominent perivascular space.

Partially empty sella turcica, a nonspecific finding.  Additionally

There is no acute infarct.

No evidence of an intracranial mass.

No extra-axial fluid collection.

No midline shift.

Vascular: Maintained flow voids within the proximal large arterial
vessels.

Skull and upper cervical spine: No focal suspicious marrow lesion.

Sinuses/Orbits: Visualized orbits show no acute finding. Bilateral
ocular lens replacements. Trace mucosal thickening within the
bilateral ethmoid sinuses.

Other: Trace fluid within the left mastoid air cells.
IMPRESSION: No evidence of acute intracranial abnormality.

Mild chronic small-vessel ischemic changes within the cerebral white
matter, similar to the prior brain MRI of 02/10/2019.

Scattered chronic hemosiderin deposition overlying the bilateral
frontal lobes, compatible with sequela of remote subarachnoid
hemorrhage.

Additionally, there are scattered supratentorial chronic
microhemorrhages which may reflect sequela of chronic hypertensive
microangiopathy and/or early manifestations of cerebral amyloid
angiopathy.

Mild generalized cerebral and cerebellar atrophy.

## 2023-09-23 ENCOUNTER — Other Ambulatory Visit: Payer: Self-pay | Admitting: Cardiovascular Disease

## 2023-09-26 ENCOUNTER — Encounter: Payer: Self-pay | Admitting: Urology

## 2023-10-01 ENCOUNTER — Ambulatory Visit (INDEPENDENT_AMBULATORY_CARE_PROVIDER_SITE_OTHER): Payer: PPO | Admitting: *Deleted

## 2023-10-01 ENCOUNTER — Telehealth: Payer: Self-pay

## 2023-10-01 ENCOUNTER — Encounter: Payer: Self-pay | Admitting: Internal Medicine

## 2023-10-01 VITALS — Ht 75.0 in | Wt 156.0 lb

## 2023-10-01 DIAGNOSIS — Z111 Encounter for screening for respiratory tuberculosis: Secondary | ICD-10-CM

## 2023-10-01 DIAGNOSIS — Z0000 Encounter for general adult medical examination without abnormal findings: Secondary | ICD-10-CM

## 2023-10-01 NOTE — Progress Notes (Signed)
 Subjective:   Theodore [name] is a 89 y.o. who presents for a Medicare Wellness preventive visit.  As a reminder, Annual Wellness Visits don't include a physical exam, and some assessments may be limited, especially if this visit is performed virtually. We may recommend an in-person follow-up visit with your provider if needed.  Visit Complete: Virtual I connected with  Theodore [name] on 10/01/23 by a audio enabled telemedicine application and verified that I am speaking with the correct person using two identifiers.  Patient Location: Home  Provider Location: Home Office  I discussed the limitations of evaluation and management by telemedicine. The patient expressed understanding and agreed to proceed.  Vital Signs: Because this visit was a virtual/telehealth visit, some criteria may be missing or patient reported. Any vitals not documented were not able to be obtained and vitals that have been documented are patient reported.  VideoDeclined- This patient declined Librarian, academic. Therefore the visit was completed with audio only.  Persons Participating in Visit: Patient assisted by wife [name] .  AWV Questionnaire: Yes: Patient Medicare AWV questionnaire was completed by the patient on 09/26/23; I have confirmed that all information answered by patient is correct and no changes since this date.  Cardiac Risk Factors include: advanced age (>38men, >49 women);dyslipidemia;male gender     Objective:    Today's Vitals   10/01/23 1014  Weight: 156 lb (70.8 kg)  Height: 6' 3 (1.905 m)   Body mass index is 19.5 kg/m.     09/26/2022   11:02 AM 08/11/2022    3:10 PM 06/02/2022    3:09 PM 12/22/2021   12:10 PM 12/19/2021    4:32 PM 12/15/2021    2:47 PM 12/09/2021   10:37 AM  Advanced Directives  Does Patient Have a Medical Advance Directive? Yes Yes Yes Yes Yes Yes No  Type of Estate agent of [place];Living will Healthcare Power of  [place];Living will Healthcare Power of [place];Living will Healthcare Power of [employer] Power of [employer] Power of Attorney   Does patient want to make changes to medical advance directive? No - Patient declined   No - Patient declined No - Patient declined No - Patient declined No - Patient declined  Copy of Healthcare Power of Attorney in Chart? Yes - validated most recent copy scanned in chart (See row information)   Yes - validated most recent copy scanned in chart (See row information) Yes - validated most recent copy scanned in chart (See row information) Yes - validated most recent copy scanned in chart (See row information)     Current Medications (verified) Outpatient Encounter Medications as of 10/01/2023  Medication Sig   ALPRAZolam  (XANAX ) 0.25 MG tablet TAKE 1 TABLET (0.25 MG TOTAL) BY MOUTH AT BEDTIME AS NEEDED FOR ANXIETY OR SLEEP.   ascorbic acid  (VITAMIN C ) 500 MG tablet Take 500 mg by mouth daily.   buPROPion  (WELLBUTRIN  SR) 150 MG 12 hr tablet Take 1 tablet (150 mg total) by mouth in the morning.   cycloSPORINE  (RESTASIS ) 0.05 % ophthalmic emulsion Place 1 drop into both eyes 2 (two) times daily.   ezetimibe  (ZETIA ) 10 MG tablet TAKE 1 TABLET BY MOUTH EVERY DAY   famotidine (PEPCID) 20 MG tablet Take 20 mg by mouth 2 (two) times daily.   Folic Acid  (FOLATE PO) Take 680 mcg by mouth daily.   levothyroxine  (SYNTHROID ) 100 MCG tablet TAKE 1 TABLET BY MOUTH EVERY DAY BEFORE BREAKFAST   mirtazapine  (  REMERON ) 7.5 MG tablet TAKE 1 TABLET BY MOUTH EVERYDAY AT BEDTIME   Probiotic Product (MISC INTESTINAL FLORA REGULAT) CAPS Take by mouth.   QUEtiapine  (SEROQUEL ) 50 MG tablet Take 1 tablet (50 mg total) by mouth at bedtime.   sertraline  (ZOLOFT ) 50 MG tablet Take 1.5 tablets (75 mg total) by mouth daily.   vitamin B-12 (CYANOCOBALAMIN ) 500 MCG tablet Take 1 tablet by mouth daily.   testosterone  cypionate (DEPOTESTOSTERONE CYPIONATE) 200 MG/ML injection INJECT 0.5  MLS (100 MG TOTAL) INTO THE MUSCLE EVERY 14 (FOURTEEN) DAYS. (Patient not taking: Reported on 10/01/2023)   No facility-administered encounter medications on file as of 10/01/2023.    Allergies (verified) Penicillins, Benzonatate , Lexapro  [escitalopram ], and Molnupiravir    History: Past Medical History:  Diagnosis Date   Actinic keratosis    Arthritis    knees   Asymptomatic Sinus bradycardia    a. 05/2016 Zio: Avg HR 61 (41-167).   Benign essential tremor    Cancer (HCC) 2002   melanoma right ear   Colon polyp    COPD (chronic obstructive pulmonary disease) (HCC)    pt said he believes it is a misdiagnosis    Coronary artery calcification seen on CT scan    a. 06/2016 CTA Chest: cor Ca2+; b. 05/2017 MV: EF >65%. No ischemia/infarct.   COVID-19 virus infection 04/06/2020   Depression    Dysplastic nevus 04/25/2022   Left lateral neck, severe, shave removal 06/05/22   History of echocardiogram    a. 04/2016 Echo: EF 60-65%, mild conc LVH. Nl PASP.   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 05/2016 Zio: Avg HR 61 (41-167). 9 SVT runs (fastest 167 - 5 beats; longest 8 beats - 101 bpm). Rare PACs/PVCs.   Pre-syncope    a. 06/2022 Zio: Predominantly sinus bradycardia with an average heart rate of 53 (40-148).  1 run of NSVT x 17 beats (143 bpm), 19 SVT runs (fastest 148 x 7 beats; longest 11 beats @ 114 bpm).  Rare PVCs/PACs. Triggered events assoc w/ sinus rhythym.   PSVT (paroxysmal supraventricular tachycardia) (HCC)    a. 05/2016 Zio: 9 SVT runs; b. 06/2022 Zio: 19 SVT runs, fastest 148, longest 11 beats.   PVC's (premature ventricular contractions)     Rhinitis    Sepsis due to cellulitis (HCC) 11/28/2021   Severe sepsis (HCC) 11/28/2021   Wears dentures    partial upper and lower   Wears hearing aid in both ears    Past Surgical History:  Procedure Laterality Date   ADENOIDECTOMY     CATARACT EXTRACTION W/PHACO Left 10/22/2017   Procedure: CATARACT EXTRACTION PHACO AND INTRAOCULAR LENS PLACEMENT (IOC)  LEFT;  Surgeon: Myrna Adine Anes, MD;  Location: [facility];  Service: Ophthalmology;  Laterality: Left;   CATARACT EXTRACTION W/PHACO Right 11/20/2017   Procedure: CATARACT EXTRACTION PHACO AND INTRAOCULAR LENS PLACEMENT (IOC) RIGHT;  Surgeon: Myrna Adine Anes, MD;  Location: [facility];  Service: Ophthalmology;  Laterality: Right;   ESOPHAGOGASTRODUODENOSCOPY (EGD) WITH PROPOFOL  N/A 10/31/2016   Procedure: ESOPHAGOGASTRODUODENOSCOPY (EGD) WITH  PROPOFOL ;  Surgeon: Gaylyn Gladis PENNER, MD;  Location: [facility];  Service: Endoscopy;  Laterality: N/A;   I & D EXTREMITY Right 11/29/2021   Procedure: IRRIGATION AND DEBRIDEMENT EXTREMITY;  Surgeon: Desiderio Schanz, MD;  Location: [facility];  Service: General;  Laterality: Right;   JOINT REPLACEMENT Right 2003   knee   knee meniscus repair Right    LEG WOUND REPAIR / CLOSURE  11/2021   MELANOMA EXCISION Right    ear. Followed by Dr. Hester   PATELLECTOMY Bilateral    TONSILLECTOMY     Family History  Problem Relation Age of Onset   Hypertension Mother    Heart disease Mother        CHF   Heart disease Father    Cancer Sister        melanoma   Social History   Socioeconomic History   Marital status: Married    Spouse name: [name]   Number of children: 4   Years of education: 16   Highest education level: Bachelor's degree (e.g., BA, AB, BS)  Occupational History   Occupation: Retired    Comment: Holiday representative -   Tobacco Use   Smoking status: Never   Smokeless tobacco: Never  Vaping Use   Vaping status: Never Used  Substance and Sexual Activity    Alcohol use: Not Currently    Comment: 1 glass of wine daily   Drug use: No   Sexual activity: Not Currently  Other Topics Concern   Not on file  Social History Narrative   Mr. Czerniak grew up in [place], [place]. He attended [employer] in [place]  and obtained his Bachelor's Degree in [employer]. 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 He and his wife are living at [facility]. He is very active at [facility]. He enjoys music.    Social Drivers of Corporate investment banker Strain: Low Risk  (09/26/2023)   Overall Financial Resource Strain (CARDIA)    Difficulty of Paying Living Expenses: Not hard at all  Food Insecurity: No Food Insecurity (09/26/2023)   Hunger Vital Sign    Worried About Running Out of Food in the Last Year: Never true    Ran Out of Food in the Last Year: Never true  Transportation Needs: No Transportation Needs (09/26/2023)   PRAPARE - Administrator, Civil Service (Medical): No    Lack of Transportation (Non-Medical): No  Physical Activity: Insufficiently Active (09/26/2023)   Exercise Vital Sign    Days of Exercise per Week: 2 days    Minutes of Exercise per Session: 60 min  Stress: Stress Concern Present (09/26/2023)   [employer] of Occupational Health - Occupational Stress Questionnaire    Feeling of Stress: Rather much  Social Connections: Socially Integrated (09/26/2023)   Social Connection and Isolation Panel    Frequency of Communication with Friends and Family: More than three times a week    Frequency of Social Gatherings with Friends and Family: Once a week    Attends Religious Services: More than 4 times per year    Active Member of [employer] or Organizations: Yes    Attends Engineer, structural: More than 4 times per year    Marital Status: Married    Tobacco Counseling Counseling given: Not  Answered    Clinical Intake:  Pre-visit preparation completed: Yes  Pain : No/denies pain  BMI - recorded: 19.5 Nutritional Status: BMI of 19-24  Normal Nutritional Risks: None Diabetes: No  Lab Results  Component Value Date   HGBA1C 5.0 01/04/2023   HGBA1C 4.8 01/17/2021   HGBA1C 4.7 05/28/2018     How often do you need to have someone help you when you read instructions, pamphlets, or other written materials from your doctor or pharmacy?: 1 - Never  Interpreter Needed?: No  Information entered by :: R. Solan Vosler LPN   Activities of Daily Living     10/01/2023   10:16 AM  In your present state of health, do you have any difficulty performing the following activities:  Hearing? 1  Comment wears aids  Vision? 0  Comment glasses  Difficulty concentrating or making decisions? 1  Walking or climbing stairs? 1  Dressing or bathing? 0  Doing errands, shopping? 1  Preparing Food and eating ? N  Using the Toilet? N  In the past six months, have you accidently leaked urine? N  Do you have problems with loss of bowel control? N  Managing your Medications? Y  Managing your Finances? Y  Housekeeping or managing your Housekeeping? Y    Patient Care Team: Marylynn Verneita CROME, MD as PCP - General (Internal Medicine) Perla Evalene PARAS, MD as PCP - Cardiology (Cardiology) Maree Jannett POUR, MD as Consulting Physician (Neurology) Delores Orvin BRAVO, NP as Nurse Practitioner (Vascular Surgery) Isaiah Scrivener, MD as Consulting Physician (Pulmonary Disease) Francisca Redell BROCKS, MD as Consulting Physician (Urology) Terryl Arthea SAUNDERS, PA-C as Physician Assistant (Physician Assistant)  I have updated your Care Teams any recent Medical Services you may have received from other providers in the past year.     Assessment:   This is a routine wellness examination for [place].  Hearing/Vision screen Hearing Screening - Comments:: Wears aids Vision Screening - Comments:: glasses   Goals Addressed              This Visit's Progress    Patient Stated       Wants to continue to paint and eat well       Depression Screen     10/01/2023   10:25 AM 07/18/2023   11:40 AM 06/20/2023    4:36 PM 06/11/2023    3:01 PM 01/04/2023   12:06 PM 12/07/2022   10:45 AM 09/26/2022   10:47 AM  PHQ 2/9 Scores  PHQ - 2 Score 2 3 0 3 2 2 2   PHQ- 9 Score 3 7 0 8 4 8 6     Fall Risk     10/01/2023   10:19 AM 07/18/2023   11:40 AM 06/20/2023    4:36 PM 06/11/2023    3:01 PM 01/04/2023   12:06 PM  Fall Risk   Falls in the past year? 1 1 0  1  Number falls in past yr: 1 1 0 1 1  Injury with Fall? 0 0 0 1 1  Risk for fall due to : History of fall(s);Impaired balance/gait History of fall(s) No Fall Risks History of fall(s) History of fall(s)  Follow up Falls evaluation completed;Falls prevention discussed Falls evaluation completed Falls evaluation completed Falls evaluation completed Falls evaluation completed    MEDICARE RISK AT HOME:  Medicare Risk at Home Any stairs in or around the home?: Yes If so, are there any without handrails?: No Home free of loose throw rugs in walkways, pet beds, electrical cords, etc?: Yes Adequate lighting in your home to reduce risk of falls?: Yes  Life alert?: No Use of a cane, walker or w/c?: Yes Grab bars in the bathroom?: Yes Shower chair or bench in shower?: Yes Elevated toilet seat or a handicapped toilet?: Yes  TIMED UP AND GO:  Was the test performed?  No  Cognitive Function: 6CIT completed    03/30/2015    2:42 PM  MMSE - Mini Mental State Exam  Orientation to time 5   Orientation to Place 5   Registration 3   Attention/ Calculation 5   Recall 3   Language- name 2 objects 2   Language- repeat 1  Language- follow 3 step command 3   Language- read & follow direction 1   Write a sentence 0   Write a sentence-comments Some difficulty. Tremors.   Copy design 1   Total score 29      Data saved with a previous flowsheet row definition         10/01/2023   10:31 AM 09/26/2022   11:02 AM 05/29/2017    9:50 AM 04/14/2016    4:35 PM  6CIT Screen  What Year? 4 points 0 points 0 points 0 points  What month? 0 points 0 points 0 points 0 points  What time? 3 points 0 points 0 points 0 points  Count back from 20 2 points 2 points 0 points 0 points  Months in reverse 4 points 4 points 0 points 0 points  Repeat phrase 6 points 6 points 0 points 0 points  Total Score 19 points 12 points 0 points 0 points    Immunizations Immunization History  Administered Date(s) Administered   Fluad Quad(high Dose 65+) 12/13/2018, 12/21/2020   Influenza,inj,Quad PF,6+ Mos 01/14/2014, 01/25/2018   Influenza-Unspecified 12/11/2012, 12/26/2019, 12/28/2021   Moderna Covid-19 Fall Seasonal Vaccine 106yrs & older 12/08/2022   Moderna Sars-Covid-2 Vaccination 03/28/2019, 04/25/2019, 05/16/2019, 06/26/2019, 01/17/2020, 07/29/2020   Pfizer Covid-19 Vaccine Bivalent Booster 17yrs & up 12/02/2020   Pneumococcal Conjugate-13 01/14/2014, 10/04/2018, 12/13/2018   Pneumococcal Polysaccharide-23 04/28/2012   Td 12/02/2021   Tdap 08/19/2014   Zoster Recombinant(Shingrix) 03/12/2017, 09/19/2021   Zoster, Live 04/28/2010    Screening Tests Health Maintenance  Topic Date Due   COVID-19 Vaccine (9 - Moderna risk 2024-25 season) 06/07/2023   Medicare Annual Wellness (AWV)  09/26/2023   INFLUENZA VACCINE  10/12/2023   DTaP/Tdap/Td (3 - Td or Tdap) 12/03/2031   Pneumococcal Vaccine: 50+ Years  Completed   Zoster Vaccines- Shingrix  Completed   Hepatitis B Vaccines  Aged Out   HPV VACCINES  Aged Out   Meningococcal B Vaccine  Aged Out    Health Maintenance  Health Maintenance Due  Topic Date Due   COVID-19 Vaccine (9 - Moderna risk 2024-25 season) 06/07/2023   Medicare Annual Wellness (AWV)  09/26/2023   Health Maintenance Items Addressed: Discussed the need to update flu and covid vaccines annually  Additional Screening:  Vision Screening: Recommended  annual ophthalmology exams for early detection of glaucoma and other disorders of the eye. Up to date   [facility] Would you like a referral to an eye doctor? No    Dental Screening: Recommended annual dental exams for proper oral hygiene  Community Resource Referral / Chronic Care Management: CRR required this visit?  No   CCM required this visit?  No   Plan:    I have personally reviewed and noted the following in the patient's chart:   Medical and social history Use of alcohol, tobacco or illicit drugs  Current medications  and supplements including opioid prescriptions. Patient is not currently taking opioid prescriptions. Functional ability and status Nutritional status Physical activity Advanced directives List of other physicians Hospitalizations, surgeries, and ER visits in previous 12 months Vitals Screenings to include cognitive, depression, and falls Referrals and appointments  In addition, I have reviewed and discussed with patient certain preventive protocols, quality metrics, and best practice recommendations. A written personalized care plan for preventive services as well as general preventive health recommendations were provided to patient.   Angeline Fredericks, LPN   [id number]/7974   After Visit Summary: (MyChart) Due to this being a telephonic visit, the after visit summary with patients personalized plan was offered to patient via MyChart   Notes: Nothing significant to report at this time.

## 2023-10-01 NOTE — Telephone Encounter (Signed)
Attempted to call pt. Line was busy. Will try again later.

## 2023-10-01 NOTE — Patient Instructions (Signed)
 Mr. Barretto , Thank you for taking time out of your busy schedule to complete your Annual Wellness Visit with me. I enjoyed our conversation and look forward to speaking with you again next year. I, as well as your care team,  appreciate your ongoing commitment to your health goals. Please review the following plan we discussed and let me know if I can assist you in the future. Your Game plan/ To Do List    Referrals: If you haven't heard from the office you've been referred to, please reach out to them at the phone provided.  Remember to update your flu and covid vaccines annually Follow up Visits: Next Medicare AWV with our clinical staff: 10/03/24 @ 11:30   Have you seen your provider in the last 6 months (3 months if uncontrolled diabetes)? Yes Next Office Visit with your provider: 02/03/24  Clinician Recommendations:  Aim for 30 minutes of exercise or brisk walking, 6-8 glasses of water, and 5 servings of fruits and vegetables each day.       This is a list of the screening recommended for you and due dates:  Health Maintenance  Topic Date Due   COVID-19 Vaccine (10 - 2024-25 season) 08/17/2023   Flu Shot  10/12/2023   Medicare Annual Wellness Visit  09/30/2024   DTaP/Tdap/Td vaccine (3 - Td or Tdap) 12/03/2031   Pneumococcal Vaccine for age over 42  Completed   Zoster (Shingles) Vaccine  Completed   Hepatitis B Vaccine  Aged Out   HPV Vaccine  Aged Out   Meningitis B Vaccine  Aged Out    Advanced directives: (In Chart) A copy of your advanced directives are scanned into your chart should your provider ever need it. Advance Care Planning is important because it:  [x]  Makes sure you receive the medical care that is consistent with your values, goals, and preferences  [x]  It provides guidance to your family and loved ones and reduces their decisional burden about whether or not they are making the right decisions based on your wishes.

## 2023-10-01 NOTE — Telephone Encounter (Signed)
-----   Message from Hans Baptist sent at 10/01/2023  3:15 PM EDT ----- On this patient's annual wellness visit he notes difficulty with medication management, finances and household management.  He also appears to have worsening memory issues.  His next appointment with his PCP is in November.  Please have him follow-up in the next 1 to 2 weeks so that we can address some of these issues with him.

## 2023-10-02 ENCOUNTER — Telehealth: Payer: Self-pay

## 2023-10-02 NOTE — Telephone Encounter (Signed)
 Copied from CRM [id number]. Topic: Referral - Question >> Oct 02, 2023  9:05 AM Chasity T wrote: Reason for CRM: [name] wife of patient is calling in because she requested a referral for her husband to start at [facility] on 10/03/23 from 9:30-3:30 pm and is wanting to know if someone can contact her back to discuss. The Interior and spatial designer of the daycare Izetta Rm. Please contact patient wife to discuss referral.

## 2023-10-02 NOTE — Telephone Encounter (Signed)
 [name] wife of patient is calling in because she requested a referral for her husband to start at [facility] on 10/03/23 from 9:30-3:30 pm and is wanting to know if someone can contact her back to discuss. The Interior and spatial designer of the daycare Izetta Rm. Please contact patient wife to discuss referral.

## 2023-10-03 NOTE — Telephone Encounter (Signed)
 Placed in red folder for completion. It look like he will need a tb skin test. Is it okay to schedule him for a nurse visit to have done?

## 2023-10-03 NOTE — Addendum Note (Signed)
 Addended by: MARYLYNN VERNEITA CROME on: 10/03/2023 05:17 PM   Modules accepted: Orders

## 2023-10-04 DIAGNOSIS — Z0279 Encounter for issue of other medical certificate: Secondary | ICD-10-CM

## 2023-10-04 NOTE — Telephone Encounter (Signed)
 Placed I quick sign folder for signature

## 2023-10-04 NOTE — Telephone Encounter (Signed)
 Spoke with pt's wife to let her know that the form is complete and has been faxed. Wife stated that the day care program would like to have a copy of pt's DNR on file. Wife would like to know she could come by and pick up two more copies of the DNR. If okay I will get them ready for signature.

## 2023-10-12 ENCOUNTER — Other Ambulatory Visit (INDEPENDENT_AMBULATORY_CARE_PROVIDER_SITE_OTHER): Payer: Self-pay | Admitting: Nurse Practitioner

## 2023-10-12 DIAGNOSIS — H6061 Unspecified chronic otitis externa, right ear: Secondary | ICD-10-CM | POA: Diagnosis not present

## 2023-10-12 DIAGNOSIS — H6123 Impacted cerumen, bilateral: Secondary | ICD-10-CM | POA: Diagnosis not present

## 2023-10-12 DIAGNOSIS — I739 Peripheral vascular disease, unspecified: Secondary | ICD-10-CM

## 2023-10-12 DIAGNOSIS — M272 Inflammatory conditions of jaws: Secondary | ICD-10-CM | POA: Diagnosis not present

## 2023-10-14 NOTE — Progress Notes (Signed)
 MRN : [id number]  Theodore [name] is a 89 y.o. (Jan 12, 1930) male who presents with chief complaint of check circulation.  History of Present Illness:   The patient returns to the office for followup regarding atherosclerotic changes of the lower extremities and review of the noninvasive studies.   There have been no interval changes in lower extremity symptoms. No interval shortening of the patient's claudication distance or development of rest pain symptoms. No new ulcers or wounds have occurred since the last visit.  There have been no significant changes to the patient's overall health care.  The patient denies amaurosis fugax or recent TIA symptoms. There are no documented recent neurological changes noted. There is no history of DVT, PE or superficial thrombophlebitis. The patient denies recent episodes of angina or shortness of breath.   ABI Rt=0.86 and Lt=0.63  (previous ABI's Rt=0.94 and Lt=0.79)   No outpatient medications have been marked as taking for the 10/15/23 encounter (Appointment) with Jama, Cordella MATSU, MD.    Past Medical History:  Diagnosis Date   Actinic keratosis    Arthritis    knees   Asymptomatic Sinus bradycardia    a. 05/2016 Zio: Avg HR 61 (41-167).   Benign essential tremor    Cancer (HCC) 2002   melanoma right ear   Colon polyp    COPD (chronic obstructive pulmonary disease) (HCC)    pt said he believes it is a misdiagnosis    Coronary artery calcification seen on CT scan    a. 06/2016 CTA Chest: cor Ca2+; b. 05/2017 MV: EF >65%. No ischemia/infarct.   COVID-19 virus infection 04/06/2020   Depression    Dysplastic nevus 04/25/2022   Left lateral neck, severe, shave removal 06/05/22   History of echocardiogram    a. 04/2016 Echo: EF 60-65%, mild conc LVH. Nl PASP.   History of kidney stones    Hx of dysplastic nevus 05/07/2017   L anterior shoulder, severe   Hx of dysplastic  nevus 09/06/2020   R lower sternum, moderate atypia   Hypothyroidism    Hypothyroidism    Laceration of right lower extremity    Melanoma (HCC) 1990's per pt   R ear   Melanoma in situ (HCC) 01/12/2014   left jaw   MSSA infection, non-invasive    Palpitations    a. 05/2016 Zio: Avg HR 61 (41-167). 9 SVT runs (fastest 167 - 5 beats; longest 8 beats - 101 bpm). Rare PACs/PVCs.   Pre-syncope    a. 06/2022 Zio: Predominantly sinus bradycardia with an average heart rate of 53 (40-148).  1 run of NSVT x 17 beats (143 bpm), 19 SVT runs (fastest 148 x 7 beats; longest 11 beats @ 114 bpm).  Rare PVCs/PACs. Triggered events assoc w/ sinus rhythym.   PSVT (paroxysmal supraventricular tachycardia) (HCC)    a. 05/2016 Zio: 9 SVT runs; b. 06/2022 Zio: 19 SVT runs, fastest 148, longest 11 beats.   PVC's (premature ventricular contractions)    Rhinitis    Sepsis due to cellulitis (HCC) 11/28/2021   Severe sepsis (HCC) 11/28/2021   Wears dentures  partial upper and lower   Wears hearing aid in both ears     Past Surgical History:  Procedure Laterality Date   ADENOIDECTOMY     CATARACT EXTRACTION W/PHACO Left 10/22/2017   Procedure: CATARACT EXTRACTION PHACO AND INTRAOCULAR LENS PLACEMENT (IOC)  LEFT;  Surgeon: Myrna Adine Anes, MD;  Location: [facility];  Service: Ophthalmology;  Laterality: Left;   CATARACT EXTRACTION W/PHACO Right 11/20/2017   Procedure: CATARACT EXTRACTION PHACO AND INTRAOCULAR LENS PLACEMENT (IOC) RIGHT;  Surgeon: Myrna Adine Anes, MD;  Location: [facility];  Service: Ophthalmology;  Laterality: Right;   ESOPHAGOGASTRODUODENOSCOPY (EGD) WITH PROPOFOL  N/A 10/31/2016   Procedure: ESOPHAGOGASTRODUODENOSCOPY (EGD) WITH PROPOFOL ;  Surgeon: Gaylyn Gladis PENNER, MD;  Location: [facility] ENDOSCOPY;  Service: Endoscopy;  Laterality: N/A;   I & D EXTREMITY Right 11/29/2021   Procedure: IRRIGATION AND DEBRIDEMENT EXTREMITY;  Surgeon: Desiderio Schanz, MD;  Location: [facility] ORS;   Service: General;  Laterality: Right;   JOINT REPLACEMENT Right 2003   knee   knee meniscus repair Right    LEG WOUND REPAIR / CLOSURE  11/2021   MELANOMA EXCISION Right    ear. Followed by Dr. Hester   PATELLECTOMY Bilateral    TONSILLECTOMY      Social History Social History   Tobacco Use   Smoking status: Never   Smokeless tobacco: Never  Vaping Use   Vaping status: Never Used  Substance Use Topics   Alcohol use: Not Currently    Comment: 1 glass of wine daily   Drug use: No    Family History Family History  Problem Relation Age of Onset   Hypertension Mother    Heart disease Mother        CHF   Heart disease Father    Cancer Sister        melanoma    Allergies  Allergen Reactions   Penicillins Rash and Other (See Comments)    Other reaction(s): Other (see comments) Other reaction(s): UNKNOWN   Benzonatate      Pruritic rash   Lexapro  [Escitalopram ] Other (See Comments)    Increased hand tremor   Molnupiravir      Pruritic rash on neck      REVIEW OF SYSTEMS (Negative unless checked)  Constitutional: [] Weight loss  [] Fever  [] Chills Cardiac: [] Chest pain   [] Chest pressure   [] Palpitations   [] Shortness of breath when laying flat   [] Shortness of breath with exertion. Vascular:  [x] Pain in legs with walking   [] Pain in legs at rest  [] History of DVT   [] Phlebitis   [] Swelling in legs   [] Varicose veins   [] Non-healing ulcers Pulmonary:   [] Uses home oxygen   [] Productive cough   [] Hemoptysis   [] Wheeze  [x] COPD   [] Asthma Neurologic:  [] Dizziness   [] Seizures   [] History of stroke   [] History of TIA  [] Aphasia   [] Vissual changes   [] Weakness or numbness in arm   [x] Weakness or numbness in leg Musculoskeletal:   [] Joint swelling   [] Joint pain   [] Low back pain Hematologic:  [] Easy bruising  [] Easy bleeding   [] Hypercoagulable state   [] Anemic Gastrointestinal:  [] Diarrhea   [] Vomiting  [x] Gastroesophageal reflux/heartburn   [] Difficulty  swallowing. Genitourinary:  [] Chronic kidney disease   [] Difficult urination  [] Frequent urination   [] Blood in urine Skin:  [] Rashes   [] Ulcers  Psychological:  [] History of anxiety   []  History of major depression.  Physical Examination  There were no vitals filed for this visit. There is  no height or weight on file to calculate BMI. Gen: WD/WN, NAD Head: [place]/AT, No temporalis wasting.  Ear/Nose/Throat: Hearing grossly intact, nares w/o erythema or drainage Eyes: PER, EOMI, sclera nonicteric.  Neck: Supple, no masses.  No bruit or JVD.  Pulmonary:  Good air movement, no audible wheezing, no use of accessory muscles.  Cardiac: RRR, normal S1, S2, no Murmurs. Vascular:  mild trophic changes, blister left 2nd toe Vessel Right Left  Radial Palpable Palpable  PT Not Palpable Not Palpable  DP Not Palpable Not Palpable  Gastrointestinal: soft, non-distended. No guarding/no peritoneal signs.  Musculoskeletal: M/S 5/5 throughout.  No visible deformity.  Neurologic: CN 2-12 intact. Pain and light touch intact in extremities.  Symmetrical.  Speech is fluent. Motor exam as listed above. Psychiatric: Judgment intact, Mood & affect appropriate for pt's clinical situation. Dermatologic: No rashes blister left 2nd toe.  No changes consistent with cellulitis.   CBC Lab Results  Component Value Date   WBC 4.3 05/31/2023   HGB 13.5 05/31/2023   HCT 39.6 05/31/2023   MCV 96.3 05/31/2023   PLT 229.0 05/31/2023    BMET    Component Value Date/Time   NA 140 05/31/2023 1537   K 5.0 05/31/2023 1537   CL 104 05/31/2023 1537   CO2 29 05/31/2023 1537   GLUCOSE 72 05/31/2023 1537   BUN 35 (H) 05/31/2023 1537   CREATININE 1.63 (H) 05/31/2023 1537   CREATININE 0.92 02/03/2015 1504   CALCIUM  9.0 05/31/2023 1537   GFRNONAA 56 (L) 06/21/2022 1712   GFRNONAA 77 02/03/2015 1504   GFRAA >60 05/10/2016 0236   GFRAA 89 02/03/2015 1504   CrCl cannot be calculated (Patient's most recent lab result is  older than the maximum 21 days allowed.).  COAG Lab Results  Component Value Date   INR 1.4 (H) 11/29/2021    Radiology No results found.   Assessment/Plan 1. PAD (peripheral artery disease) (HCC) (Primary)  Recommend:  The patient has evidence of atherosclerosis of the lower extremities with claudication.  The patient does not voice lifestyle limiting changes at this point in time.  Noninvasive studies do not suggest clinically significant change.  No invasive studies, angiography or surgery at this time The patient should continue walking and begin a more formal exercise program.  The patient should continue antiplatelet therapy and aggressive treatment of the lipid abnormalities  No changes in the patient's medications at this time  Continued surveillance is indicated as atherosclerosis is likely to progress with time.    The patient will continue follow up with noninvasive studies as ordered.  - VAS US  ABI WITH/WO TBI; Future  2. Chronic obstructive pulmonary disease, unspecified COPD type (HCC) Continue pulmonary medications and aerosols as already ordered, these medications have been reviewed and there are no changes at this time.   3. GERD without esophagitis Continue PPI as already ordered, this medication has been reviewed and there are no changes at this time.  Avoidence of caffeine and alcohol  Moderate elevation of the head of the bed   4. Lumbar radiculopathy Continue medications to treat the patient's degenerative disease as already ordered, these medications have been reviewed and there are no changes at this time.  Continued activity and therapy was stressed.    Cordella Shawl, MD  10/14/2023 2:53 PM

## 2023-10-15 ENCOUNTER — Ambulatory Visit (INDEPENDENT_AMBULATORY_CARE_PROVIDER_SITE_OTHER): Payer: PPO | Admitting: Vascular Surgery

## 2023-10-15 ENCOUNTER — Encounter (INDEPENDENT_AMBULATORY_CARE_PROVIDER_SITE_OTHER): Payer: Self-pay | Admitting: Vascular Surgery

## 2023-10-15 ENCOUNTER — Ambulatory Visit (INDEPENDENT_AMBULATORY_CARE_PROVIDER_SITE_OTHER): Payer: PPO

## 2023-10-15 VITALS — BP 116/51 | HR 58 | Resp 16 | Ht 75.0 in | Wt 161.0 lb

## 2023-10-15 DIAGNOSIS — R296 Repeated falls: Secondary | ICD-10-CM | POA: Diagnosis not present

## 2023-10-15 DIAGNOSIS — R2681 Unsteadiness on feet: Secondary | ICD-10-CM | POA: Diagnosis not present

## 2023-10-15 DIAGNOSIS — K219 Gastro-esophageal reflux disease without esophagitis: Secondary | ICD-10-CM | POA: Diagnosis not present

## 2023-10-15 DIAGNOSIS — I739 Peripheral vascular disease, unspecified: Secondary | ICD-10-CM

## 2023-10-15 DIAGNOSIS — M5416 Radiculopathy, lumbar region: Secondary | ICD-10-CM

## 2023-10-15 DIAGNOSIS — J449 Chronic obstructive pulmonary disease, unspecified: Secondary | ICD-10-CM

## 2023-10-15 DIAGNOSIS — M6281 Muscle weakness (generalized): Secondary | ICD-10-CM | POA: Diagnosis not present

## 2023-10-16 ENCOUNTER — Telehealth: Payer: Self-pay | Admitting: Internal Medicine

## 2023-10-16 LAB — VAS US ABI WITH/WO TBI
Left ABI: 0.63
Right ABI: 0.86

## 2023-10-16 NOTE — Telephone Encounter (Unsigned)
 Copied from CRM [id number] [id number]. Topic: General - Other >> Oct 16, 2023 12:58 PM Gennette ORN wrote: Reason for CRM: [facility] [phone] is calling because he sent a form over to be filled out. Reach if you haven't received.

## 2023-10-17 ENCOUNTER — Ambulatory Visit (INDEPENDENT_AMBULATORY_CARE_PROVIDER_SITE_OTHER): Admitting: Family Medicine

## 2023-10-17 ENCOUNTER — Encounter: Payer: Self-pay | Admitting: Family Medicine

## 2023-10-17 VITALS — BP 112/62 | HR 68 | Temp 97.6°F | Ht 75.0 in | Wt 163.6 lb

## 2023-10-17 DIAGNOSIS — G309 Alzheimer's disease, unspecified: Secondary | ICD-10-CM | POA: Diagnosis not present

## 2023-10-17 DIAGNOSIS — R1310 Dysphagia, unspecified: Secondary | ICD-10-CM | POA: Diagnosis not present

## 2023-10-17 DIAGNOSIS — R490 Dysphonia: Secondary | ICD-10-CM | POA: Diagnosis not present

## 2023-10-17 DIAGNOSIS — J029 Acute pharyngitis, unspecified: Secondary | ICD-10-CM | POA: Insufficient documentation

## 2023-10-17 DIAGNOSIS — F0283 Dementia in other diseases classified elsewhere, unspecified severity, with mood disturbance: Secondary | ICD-10-CM

## 2023-10-17 LAB — POC COVID19 BINAXNOW: SARS Coronavirus 2 Ag: NEGATIVE

## 2023-10-17 NOTE — Progress Notes (Signed)
 Patient ID: Theodore [name], male    DOB: [birth year], 89 y.o.   MRN: [id number]  This visit was conducted in person.  BP 112/62   Pulse 68   Temp 97.6 F (36.4 C) (Oral)   Ht 6' 3 (1.905 m)   Wt 163 lb 9.6 oz (74.2 kg)   SpO2 98%   BMI 20.45 kg/m    CC:  Chief Complaint  Patient presents with   Sore Throat    For about 2 days. He did choke on food a few days ago.     Subjective:   HPI: Theodore [name] is a 89 y.o. male presenting on 10/17/2023 for Sore Throat (For about 2 days. He did choke on food a few days ago. )   PCP Marylynn   Date of onset: 2 days.. choked on chicken... wife reports he chokes frequently.  Since then has not been able to talk above a whisper.  Has hoarse voice  No throat pain  Wife says he is more disoriented in last few days as well. Calling on phne early AM.  No fever   No congestion, stable cough, NO SOB, no abd pain, No  new rash, no diarrhea, no constipation.  Uses Pepcid AC 2 x  daily... no heartburn.  Swallowing study 6  months ago... recommended thickened liquids... he is NOT compliant.   History of dysphagia ? Past CVA as cause per wife he has not had CVA.  Sick contacts: wife states mild ST today COVID testing:   none     He  has tried to treat with  cherry bark  solution and  Steam/ vinegar treatment    Non-smoker. History of dementia.     Relevant past medical, surgical, family and social history reviewed and updated as indicated. Interim medical history since our last visit reviewed. Allergies and medications reviewed and updated. Outpatient Medications Prior to Visit  Medication Sig Dispense Refill   ALPRAZolam  (XANAX ) 0.25 MG tablet TAKE 1 TABLET (0.25 MG TOTAL) BY MOUTH AT BEDTIME AS NEEDED FOR ANXIETY OR SLEEP. 30 tablet 3   ascorbic acid  (VITAMIN C ) 500 MG tablet Take 500 mg by mouth daily.     buPROPion  (WELLBUTRIN  SR) 150 MG 12 hr tablet Take 1 tablet (150 mg total) by mouth in the morning. 90 tablet 1   cycloSPORINE   (RESTASIS ) 0.05 % ophthalmic emulsion Place 1 drop into both eyes 2 (two) times daily.     ezetimibe  (ZETIA ) 10 MG tablet TAKE 1 TABLET BY MOUTH EVERY DAY 90 tablet 2   famotidine (PEPCID) 20 MG tablet Take 20 mg by mouth 2 (two) times daily.     Folic Acid  (FOLATE PO) Take 680 mcg by mouth daily.     levothyroxine  (SYNTHROID ) 100 MCG tablet TAKE 1 TABLET BY MOUTH EVERY DAY BEFORE BREAKFAST 90 tablet 3   mirtazapine  (REMERON ) 7.5 MG tablet TAKE 1 TABLET BY MOUTH EVERYDAY AT BEDTIME 90 tablet 1   Probiotic Product (MISC INTESTINAL FLORA REGULAT) CAPS Take by mouth.     QUEtiapine  (SEROQUEL ) 50 MG tablet Take 1 tablet (50 mg total) by mouth at bedtime. 90 tablet 1   sertraline  (ZOLOFT ) 50 MG tablet Take 1.5 tablets (75 mg total) by mouth daily. 135 tablet 1   vitamin B-12 (CYANOCOBALAMIN ) 500 MCG tablet Take 1 tablet by mouth daily.     testosterone  cypionate (DEPOTESTOSTERONE CYPIONATE) 200 MG/ML injection INJECT 0.5 MLS (100 MG TOTAL) INTO THE MUSCLE EVERY 14 (FOURTEEN) DAYS. (  Patient not taking: Reported on 10/01/2023) 1 mL 2   No facility-administered medications prior to visit.     Per HPI unless specifically indicated in ROS section below Review of Systems  Constitutional:  Negative for fatigue and fever.  HENT:  Positive for sore throat. Negative for ear pain.   Eyes:  Negative for pain.  Respiratory:  Negative for cough and shortness of breath.   Cardiovascular:  Negative for chest pain, palpitations and leg swelling.  Gastrointestinal:  Negative for abdominal pain.  Genitourinary:  Negative for dysuria.  Musculoskeletal:  Negative for arthralgias.  Neurological:  Negative for syncope, light-headedness and headaches.  Psychiatric/Behavioral:  Negative for dysphoric mood.    Objective:  BP 112/62   Pulse 68   Temp 97.6 F (36.4 C) (Oral)   Ht 6' 3 (1.905 m)   Wt 163 lb 9.6 oz (74.2 kg)   SpO2 98%   BMI 20.45 kg/m   Wt Readings from Last 3 Encounters:  10/17/23 163 lb 9.6  oz (74.2 kg)  10/15/23 161 lb (73 kg)  10/01/23 156 lb (70.8 kg)      Physical Exam Constitutional:      Appearance: He is well-developed.     Comments: Elderly male in NAD  HENT:     Head: Normocephalic.     Right Ear: Hearing normal.     Left Ear: Hearing normal.     Nose: Nose normal.     Mouth/Throat:     Mouth: Mucous membranes are moist.     Pharynx: Posterior oropharyngeal erythema present.  Eyes:     Extraocular Movements: Extraocular movements intact.     Conjunctiva/sclera: Conjunctivae normal.     Pupils: Pupils are equal, round, and reactive to light.  Neck:     Thyroid : No thyroid  mass or thyromegaly.     Vascular: No carotid bruit.     Trachea: Trachea normal.  Cardiovascular:     Rate and Rhythm: Normal rate and regular rhythm.     Pulses: Normal pulses.     Heart sounds: Heart sounds not distant. No murmur heard.    No friction rub. No gallop.     Comments: No peripheral edema Pulmonary:     Effort: Pulmonary effort is normal. No respiratory distress.     Breath sounds: Normal breath sounds.  Skin:    General: Skin is warm and dry.     Findings: No rash.  Psychiatric:        Speech: Speech normal.        Behavior: Behavior normal.        Thought Content: Thought content normal.       Results for orders placed or performed in visit on 10/15/23  VAS US  ABI WITH/WO TBI   Collection Time: 10/15/23 10:43 AM  Result Value Ref Range   Right ABI .86    Left ABI .63     Assessment and Plan  ST (sore throat) Assessment & Plan: Acute, possible early viral infection.  COVID test performed in office today. Given symptoms started after choking spell I wonder if there is throat irritation versus acid as cause of both hoarse voice, sore throat and chronic dysphagia. Appears barium swallow showed no clear stricture 8 months ago. Swallowing recommendations by speech therapy not followed by patient. Lung exam clear and no suggestion of aspiration  pneumonia.  Continue Pepcid AC twice daily but start trial of Prilosec for the next 2 weeks.  Follow-up with PCP if not improving  and consider follow-up with GI for possible EGD.  Orders: -     POC COVID-19 BinaxNow  Hoarse voice quality  Dysphagia, unspecified type Assessment & Plan: Chronic, complicated patient unknown to me.  Unclear cause.  Per wife no past CVA.  Recommend following up with PCP regarding further evaluation. Encouraged patient to follow dysphagia protocol given earlier in the year.   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  No clear sign of ongoing infection.     No follow-ups on file.   Greig Ring, MD

## 2023-10-17 NOTE — Assessment & Plan Note (Signed)
 Chronic, complicated patient unknown to me.  Unclear cause.  Per wife no past CVA.  Recommend following up with PCP regarding further evaluation. Encouraged patient to follow dysphagia protocol given earlier in the year.

## 2023-10-17 NOTE — Assessment & Plan Note (Addendum)
 Acute, possible early viral infection.  COVID test performed in office today. Given symptoms started after choking spell I wonder if there is throat irritation versus acid as cause of both hoarse voice, sore throat and chronic dysphagia. Appears barium swallow showed no clear stricture 8 months ago. Swallowing recommendations by speech therapy not followed by patient. Lung exam clear and no suggestion of aspiration pneumonia.  Continue Pepcid AC twice daily but start trial of Prilosec for the next 2 weeks.  Follow-up with PCP if not improving and consider follow-up with GI for possible EGD.

## 2023-10-17 NOTE — Assessment & Plan Note (Signed)
 Chronic with mild  recent worsening of cognition.  No clear sign of ongoing infection.

## 2023-10-17 NOTE — Patient Instructions (Signed)
 Start Prilosec 20 mg daily.  Continue Pepcid AC twice daily.  Follow up with Dr. Tullo if not improving

## 2023-10-18 ENCOUNTER — Other Ambulatory Visit: Payer: Self-pay | Admitting: Internal Medicine

## 2023-10-18 ENCOUNTER — Encounter: Payer: Self-pay | Admitting: Internal Medicine

## 2023-10-18 DIAGNOSIS — Z7189 Other specified counseling: Secondary | ICD-10-CM

## 2023-10-18 DIAGNOSIS — F0283 Dementia in other diseases classified elsewhere, unspecified severity, with mood disturbance: Secondary | ICD-10-CM

## 2023-10-18 NOTE — Telephone Encounter (Signed)
 Copied from CRM [id number]. Topic: General - Other >> Oct 16, 2023 12:58 PM Gennette ORN wrote: Reason for CRM: [facility] [phone] is calling because he sent a form over to be filled out. Reach if you haven't received. >> Oct 18, 2023  2:58 PM Rosina BIRCH wrote: Darilyn from [facility] called stating he has not received an update regarding a FL2 fax that he sent over. Elijah will refax the FL2 form CB [phone]

## 2023-10-18 NOTE — Telephone Encounter (Signed)
 LMTCB. Need to let them know that we did receive the FL2 form however Dr. Marylynn is out of the office until Tuesday next week. If okay we can have a different provider sign if that will be okay.

## 2023-10-19 NOTE — Telephone Encounter (Unsigned)
 Copied from CRM [id number]. Topic: General - Other >> Oct 19, 2023  9:43 AM Annabella S wrote: Reason for CRM: Elaisa from [facility] returning [place] call please follow up  [phone]

## 2023-10-19 NOTE — Telephone Encounter (Signed)
 Theodore [name] has just called back to speak with Theodore [name] due to missed call. I advised him the question she has regarding another provider signing and he stated that it is okay for someone to sign the forms.

## 2023-10-19 NOTE — Telephone Encounter (Signed)
 Form is signed and faxed back to [facility]

## 2023-10-20 ENCOUNTER — Encounter (INDEPENDENT_AMBULATORY_CARE_PROVIDER_SITE_OTHER): Payer: Self-pay | Admitting: Vascular Surgery

## 2023-10-24 DIAGNOSIS — R634 Abnormal weight loss: Secondary | ICD-10-CM | POA: Diagnosis not present

## 2023-10-24 DIAGNOSIS — R2689 Other abnormalities of gait and mobility: Secondary | ICD-10-CM | POA: Diagnosis not present

## 2023-10-24 DIAGNOSIS — R4189 Other symptoms and signs involving cognitive functions and awareness: Secondary | ICD-10-CM | POA: Diagnosis not present

## 2023-10-24 DIAGNOSIS — F32A Depression, unspecified: Secondary | ICD-10-CM | POA: Diagnosis not present

## 2023-10-24 DIAGNOSIS — G250 Essential tremor: Secondary | ICD-10-CM | POA: Diagnosis not present

## 2023-10-24 DIAGNOSIS — G4710 Hypersomnia, unspecified: Secondary | ICD-10-CM | POA: Diagnosis not present

## 2023-10-24 DIAGNOSIS — Z1331 Encounter for screening for depression: Secondary | ICD-10-CM | POA: Diagnosis not present

## 2023-11-06 ENCOUNTER — Ambulatory Visit (INDEPENDENT_AMBULATORY_CARE_PROVIDER_SITE_OTHER): Admitting: Podiatry

## 2023-11-06 ENCOUNTER — Encounter: Payer: Self-pay | Admitting: Podiatry

## 2023-11-06 VITALS — Ht 75.0 in | Wt 163.6 lb

## 2023-11-06 DIAGNOSIS — R319 Hematuria, unspecified: Secondary | ICD-10-CM | POA: Diagnosis not present

## 2023-11-06 DIAGNOSIS — N2581 Secondary hyperparathyroidism of renal origin: Secondary | ICD-10-CM | POA: Diagnosis not present

## 2023-11-06 DIAGNOSIS — B351 Tinea unguium: Secondary | ICD-10-CM

## 2023-11-06 DIAGNOSIS — N1831 Chronic kidney disease, stage 3a: Secondary | ICD-10-CM | POA: Diagnosis not present

## 2023-11-06 DIAGNOSIS — N182 Chronic kidney disease, stage 2 (mild): Secondary | ICD-10-CM | POA: Diagnosis not present

## 2023-11-06 DIAGNOSIS — M79675 Pain in left toe(s): Secondary | ICD-10-CM

## 2023-11-06 DIAGNOSIS — M79674 Pain in right toe(s): Secondary | ICD-10-CM | POA: Diagnosis not present

## 2023-11-06 DIAGNOSIS — N1832 Chronic kidney disease, stage 3b: Secondary | ICD-10-CM | POA: Diagnosis not present

## 2023-11-06 DIAGNOSIS — I10 Essential (primary) hypertension: Secondary | ICD-10-CM | POA: Diagnosis not present

## 2023-11-06 DIAGNOSIS — I129 Hypertensive chronic kidney disease with stage 1 through stage 4 chronic kidney disease, or unspecified chronic kidney disease: Secondary | ICD-10-CM | POA: Diagnosis not present

## 2023-11-06 NOTE — Progress Notes (Signed)
 Chief Complaint  Patient presents with   Nail Problem    Pt is here for RFC.    SUBJECTIVE Patient presents to office today complaining of elongated, thickened nails that cause pain while ambulating in shoes.  Patient is unable to trim their own nails.  Patient continues to have a preulcerative callus lesion overlying the DIPJ of the left third toe.  Patient is here for further evaluation and treatment.  Past Medical History:  Diagnosis Date   Actinic keratosis    Arthritis    knees   Asymptomatic Sinus bradycardia    a. 05/2016 Zio: Avg HR 61 (41-167).   Benign essential tremor    Cancer (HCC) 2002   melanoma right ear   Colon polyp    COPD (chronic obstructive pulmonary disease) (HCC)    pt said he believes it is a misdiagnosis    Coronary artery calcification seen on CT scan    a. 06/2016 CTA Chest: cor Ca2+; b. 05/2017 MV: EF >65%. No ischemia/infarct.   COVID-19 virus infection 04/06/2020   Depression    Dysplastic nevus 04/25/2022   Left lateral neck, severe, shave removal 06/05/22   History of echocardiogram    a. 04/2016 Echo: EF 60-65%, mild conc LVH. Nl PASP.   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 05/2016 Zio: Avg HR 61 (41-167). 9 SVT runs (fastest 167 - 5 beats; longest 8 beats - 101 bpm). Rare PACs/PVCs.   Pre-syncope    a. 06/2022 Zio: Predominantly sinus bradycardia with an average heart rate of 53 (40-148).  1 run of NSVT x 17 beats (143 bpm), 19 SVT runs (fastest 148 x 7 beats; longest 11 beats @ 114 bpm).  Rare PVCs/PACs. Triggered events assoc w/ sinus rhythym.   PSVT (paroxysmal supraventricular tachycardia) (HCC)     a. 05/2016 Zio: 9 SVT runs; b. 06/2022 Zio: 19 SVT runs, fastest 148, longest 11 beats.   PVC's (premature ventricular contractions)    Rhinitis    Sepsis due to cellulitis (HCC) 11/28/2021   Severe sepsis (HCC) 11/28/2021   Wears dentures    partial upper and lower   Wears hearing aid in both ears     Allergies  Allergen Reactions   Penicillins Rash and Other (See Comments)    Other reaction(s): Other (see comments) Other reaction(s): UNKNOWN   Benzonatate      Pruritic rash   Lexapro  [Escitalopram ] Other (See Comments)    Increased hand tremor   Molnupiravir      Pruritic rash on neck      OBJECTIVE General Patient is awake, alert, and oriented x 3 and in no acute distress. Derm Skin is dry and supple bilateral. Negative open lesions or macerations. Remaining integument unremarkable. Nails are tender, long, thickened and dystrophic with subungual debris, consistent with onychomycosis, 1-5 bilateral. No signs of infection noted. Callus lesion noted overlying the DIPJ of the left third toe.  After debridement there is a superficial wound measuring approximately 0.2 x 0.2 x 0.1 cm.  Partial thickness limited to breakdown of the skin.  Vasc  DP and PT pedal pulses palpable bilaterally. Temperature gradient within normal limits.  Neuro grossly intact via light touch Musculoskeletal Exam hammertoe deformity noted to the lesser digits bilateral  ASSESSMENT 1.  Pain due to onychomycosis of toenails both 2.  Superficial wound DIPJ left third toe 3.  Hammertoe contracture lesser digits bilateral  PLAN OF CARE -Patient evaluated today.  -Today we discussed the pathology and etiology of the hammertoes causing excessive pressure to the distal tips of the toes.  The patient is not interested in surgical intervention at this time.  Recommend conservative care including good supportive tennis shoes and the bicycles going barefoot. -Instructed to maintain good pedal hygiene and foot care.   -Mechanical debridement of nails 1-5 bilaterally performed using a nail nipper. Filed with dremel without incident.  -Light debridement of the superficial wound was performed today using a tissue nipper.  Recommend triple antibiotic and a Band-Aid daily to protect the toe from the shoes  -Return to clinic in 3 mos. routine footcare   Thresa EMERSON Sar, DPM [facility]  Dr. Thresa EMERSON Sar, DPM    [address] [place], [place] [address]                Office [phone]  Fax [phone]

## 2023-11-08 ENCOUNTER — Other Ambulatory Visit: Payer: Self-pay | Admitting: Internal Medicine

## 2023-11-08 DIAGNOSIS — R319 Hematuria, unspecified: Secondary | ICD-10-CM | POA: Diagnosis not present

## 2023-11-08 DIAGNOSIS — I129 Hypertensive chronic kidney disease with stage 1 through stage 4 chronic kidney disease, or unspecified chronic kidney disease: Secondary | ICD-10-CM | POA: Diagnosis not present

## 2023-11-08 DIAGNOSIS — N1832 Chronic kidney disease, stage 3b: Secondary | ICD-10-CM | POA: Diagnosis not present

## 2023-11-08 DIAGNOSIS — N2581 Secondary hyperparathyroidism of renal origin: Secondary | ICD-10-CM | POA: Diagnosis not present

## 2023-11-09 ENCOUNTER — Other Ambulatory Visit: Payer: Self-pay | Admitting: Internal Medicine

## 2023-11-09 MED ORDER — BUPROPION HCL ER (SR) 150 MG PO TB12
150.0000 mg | ORAL_TABLET | Freq: Every morning | ORAL | 1 refills | Status: DC
Start: 2023-11-09 — End: 2024-01-14

## 2023-11-09 NOTE — Addendum Note (Signed)
 Addended by: HARRIETTE RAISIN on: 11/09/2023 08:18 AM   Modules accepted: Orders

## 2023-11-14 ENCOUNTER — Ambulatory Visit: Admitting: Urology

## 2023-11-15 ENCOUNTER — Ambulatory Visit: Payer: Self-pay | Admitting: Urology

## 2023-11-21 ENCOUNTER — Ambulatory Visit: Admitting: Urology

## 2023-11-22 DIAGNOSIS — H04123 Dry eye syndrome of bilateral lacrimal glands: Secondary | ICD-10-CM | POA: Diagnosis not present

## 2023-11-22 DIAGNOSIS — Z961 Presence of intraocular lens: Secondary | ICD-10-CM | POA: Diagnosis not present

## 2023-11-22 DIAGNOSIS — H01003 Unspecified blepharitis right eye, unspecified eyelid: Secondary | ICD-10-CM | POA: Diagnosis not present

## 2023-11-22 DIAGNOSIS — H0289 Other specified disorders of eyelid: Secondary | ICD-10-CM | POA: Diagnosis not present

## 2023-11-26 NOTE — Telephone Encounter (Addendum)
 Patient verbalized understanding of lab results.  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 We will discuss more at next visit.   The rest of the labs are ok with minor variations. ----- Message ----- From: Lab, Background User Sent: 10/24/2023   5:13 PM EDT To: Jannett Mickie Fairly, MD

## 2023-11-27 ENCOUNTER — Other Ambulatory Visit
Admission: RE | Admit: 2023-11-27 | Discharge: 2023-11-27 | Disposition: A | Discharge status: Home / Self Care | Attending: Urology | Admitting: Urology

## 2023-11-27 ENCOUNTER — Ambulatory Visit (INDEPENDENT_AMBULATORY_CARE_PROVIDER_SITE_OTHER): Admitting: Urology

## 2023-11-27 VITALS — BP 112/64 | HR 69 | Wt 161.2 lb

## 2023-11-27 DIAGNOSIS — R972 Elevated prostate specific antigen [PSA]: Secondary | ICD-10-CM | POA: Insufficient documentation

## 2023-11-27 LAB — PSA: Prostatic Specific Antigen: 34.98 ng/mL — ABNORMAL HIGH (ref 0.00–4.00)

## 2023-11-27 NOTE — Progress Notes (Signed)
   11/27/2023 3:32 PM   Theodore [name] [birth year] [id number]  Reason for visit: Follow up suspected prostate cancer on watchful waiting  History: Frail gentleman who has been on watchful waiting for suspected prostate cancer with persistently elevated and rising PSA, he has deferred further imaging or biopsy  Physical Exam: BP 112/64 (BP Location: Left Arm, Patient Position: Sitting, Cuff Size: Normal)   Pulse 69   Wt 161 lb 3.2 oz (73.1 kg)   SpO2 99%   BMI 20.15 kg/m   Imaging/labs: PSA today pending, prior PSA 22 from August 2024  Today: Denies any major changes over the last year, specifically no dysuria, gross hematuria, pelvic pain, bone pain  Plan:   With his overall frailty and comorbidities, agree that watchful waiting remains most reasonable approach, discussed other options including biopsy or further imaging and he defers.  Will contact with PSA results today.  If PSA rises greater than 40 could consider CT if patient amenable   Theodore JAYSON Burnet, MD  [facility] [address], [address] [place], [place] [address] [phone]

## 2023-11-28 ENCOUNTER — Ambulatory Visit: Payer: Self-pay | Admitting: Urology

## 2023-12-03 ENCOUNTER — Ambulatory Visit: Admitting: Dermatology

## 2023-12-06 ENCOUNTER — Ambulatory Visit: Admitting: Dermatology

## 2023-12-06 ENCOUNTER — Encounter: Payer: Self-pay | Admitting: Dermatology

## 2023-12-06 DIAGNOSIS — Z86006 Personal history of melanoma in-situ: Secondary | ICD-10-CM | POA: Diagnosis not present

## 2023-12-06 DIAGNOSIS — L814 Other melanin hyperpigmentation: Secondary | ICD-10-CM

## 2023-12-06 DIAGNOSIS — L720 Epidermal cyst: Secondary | ICD-10-CM | POA: Diagnosis not present

## 2023-12-06 DIAGNOSIS — Z86018 Personal history of other benign neoplasm: Secondary | ICD-10-CM | POA: Diagnosis not present

## 2023-12-06 DIAGNOSIS — Z85820 Personal history of malignant melanoma of skin: Secondary | ICD-10-CM

## 2023-12-06 DIAGNOSIS — L820 Inflamed seborrheic keratosis: Secondary | ICD-10-CM

## 2023-12-06 DIAGNOSIS — L821 Other seborrheic keratosis: Secondary | ICD-10-CM | POA: Diagnosis not present

## 2023-12-06 DIAGNOSIS — W908XXA Exposure to other nonionizing radiation, initial encounter: Secondary | ICD-10-CM

## 2023-12-06 DIAGNOSIS — L578 Other skin changes due to chronic exposure to nonionizing radiation: Secondary | ICD-10-CM

## 2023-12-06 DIAGNOSIS — Z1283 Encounter for screening for malignant neoplasm of skin: Secondary | ICD-10-CM | POA: Diagnosis not present

## 2023-12-06 DIAGNOSIS — D1801 Hemangioma of skin and subcutaneous tissue: Secondary | ICD-10-CM | POA: Diagnosis not present

## 2023-12-06 DIAGNOSIS — L729 Follicular cyst of the skin and subcutaneous tissue, unspecified: Secondary | ICD-10-CM

## 2023-12-06 DIAGNOSIS — D229 Melanocytic nevi, unspecified: Secondary | ICD-10-CM

## 2023-12-06 DIAGNOSIS — D692 Other nonthrombocytopenic purpura: Secondary | ICD-10-CM

## 2023-12-06 NOTE — Patient Instructions (Addendum)
 Cryotherapy Aftercare  Wash gently with soap and water everyday.   Apply Vaseline and Band-Aid daily until healed.   Melanoma ABCDEs  Melanoma is the most dangerous type of skin cancer, and is the leading cause of death from skin disease.  You are more likely to develop melanoma if you: Have light-colored skin, light-colored eyes, or red or blond hair Spend a lot of time in the sun Tan regularly, either outdoors or in a tanning bed Have had blistering sunburns, especially during childhood Have a close family member who has had a melanoma Have atypical moles or large birthmarks  Early detection of melanoma is key since treatment is typically straightforward and cure rates are extremely high if we catch it early.   The first sign of melanoma is often a change in a mole or a new dark spot.  The ABCDE system is a way of remembering the signs of melanoma.  A for asymmetry:  The two halves do not match. B for border:  The edges of the growth are irregular. C for color:  A mixture of colors are present instead of an even brown color. D for diameter:  Melanomas are usually (but not always) greater than 6mm - the size of a pencil eraser. E for evolution:  The spot keeps changing in size, shape, and color.  Please check your skin once per month between visits. You can use a small mirror in front and a large mirror behind you to keep an eye on the back side or your body.   If you see any new or changing lesions before your next follow-up, please call to schedule a visit.  Please continue daily skin protection including broad spectrum sunscreen SPF 30+ to sun-exposed areas, reapplying every 2 hours as needed when you're outdoors.    Due to recent changes in healthcare laws, you may see results of your pathology and/or laboratory studies on MyChart before the doctors have had a chance to review them. We understand that in some cases there may be results that are confusing or concerning to you.  Please understand that not all results are received at the same time and often the doctors may need to interpret multiple results in order to provide you with the best plan of care or course of treatment. Therefore, we ask that you please give us  2 business days to thoroughly review all your results before contacting the office for clarification. Should we see a critical lab result, you will be contacted sooner.   If You Need Anything After Your Visit  If you have any questions or concerns for your doctor, please call our main line at [phone] and press option 4 to reach your doctor's medical assistant. If no one answers, please leave a voicemail as directed and we will return your call as soon as possible. Messages left after 4 pm will be answered the following business day.   You may also send us  a message via MyChart. We typically respond to MyChart messages within 1-2 business days.  For prescription refills, please ask your pharmacy to contact our office. Our fax number is [phone].  If you have an urgent issue when the clinic is closed that cannot wait until the next business day, you can page your doctor at the number below.    Please note that while we do our best to be available for urgent issues outside of office hours, we are not available 24/7.   If you have an urgent issue  and are unable to reach us , you may choose to seek medical care at your doctor's office, retail clinic, urgent care center, or emergency room.  If you have a medical emergency, please immediately call 911 or go to the emergency department.  Pager Numbers  - Dr. Hester: [phone]  - Dr. Jackquline: [phone]  - Dr. Claudene: [phone]   - Dr. Raymund: [phone]  In the event of inclement weather, please call our main line at [phone] for an update on the status of any delays or closures.  Dermatology Medication Tips: Please keep the boxes that topical medications come in in order to  help keep track of the instructions about where and how to use these. Pharmacies typically print the medication instructions only on the boxes and not directly on the medication tubes.   If your medication is too expensive, please contact our office at [phone] option 4 or send us  a message through MyChart.   We are unable to tell what your co-pay for medications will be in advance as this is different depending on your insurance coverage. However, we may be able to find a substitute medication at lower cost or fill out paperwork to get insurance to cover a needed medication.   If a prior authorization is required to get your medication covered by your insurance company, please allow us  1-2 business days to complete this process.  Drug prices often vary depending on where the prescription is filled and some pharmacies may offer cheaper prices.  The website www.goodrx.com contains coupons for medications through different pharmacies. The prices here do not account for what the cost may be with help from insurance (it may be cheaper with your insurance), but the website can give you the price if you did not use any insurance.  - You can print the associated coupon and take it with your prescription to the pharmacy.  - You may also stop by our office during regular business hours and pick up a GoodRx coupon card.  - If you need your prescription sent electronically to a different pharmacy, notify our office through [facility] or by phone at [phone] option 4.     Si Usted Necesita Algo Despus de Su Visita  Tambin puede enviarnos un mensaje a travs de Clinical cytogeneticist. Por lo general respondemos a los mensajes de MyChart en el transcurso de 1 a 2 das hbiles.  Para renovar recetas, por favor pida a su farmacia que se ponga en contacto con nuestra oficina. Randi lakes de fax es [place] [phone].  Si tiene un asunto urgente cuando la clnica est cerrada y que no puede esperar hasta  el siguiente da hbil, puede llamar/localizar a su doctor(a) al nmero que aparece a continuacin.   Por favor, tenga en cuenta que aunque hacemos todo lo posible para estar disponibles para asuntos urgentes fuera del horario de [place], no estamos disponibles las 24 horas del da, los 7 [address]  [address] de la [place].   Si tiene un problema urgente y no puede comunicarse con nosotros, puede optar por buscar atencin mdica  en el consultorio de su doctor(a), en una clnica privada, en un centro de atencin urgente o en una sala de emergencias.  Si tiene Engineer, drilling, por favor llame inmediatamente al 911 o vaya a la sala de emergencias.  Nmeros de bper  - Dr. Hester: [phone]  - Dra. Jackquline: [phone]  - Dr. Claudene: [phone]  - Dra. Kitts: [phone]  En caso de inclemencias del tiempo, por favor  llame a landry lnea principal al [phone] para una actualizacin sobre el [place] de cualquier retraso o cierre.  Consejos para la medicacin en dermatologa: Por favor, guarde las cajas en las que vienen los medicamentos de uso tpico para ayudarle a seguir las instrucciones sobre dnde y cmo usarlos. Las farmacias generalmente imprimen las instrucciones del medicamento slo en las cajas y no directamente en los tubos del [place].   Si su medicamento es muy caro, por favor, pngase en contacto con landry rieger llamando al [phone] y presione la opcin 4 o envenos un mensaje a travs de Clinical cytogeneticist.   No podemos decirle cul ser su copago por los medicamentos por adelantado ya que esto es diferente dependiendo de la cobertura de su seguro. Sin embargo, es posible que podamos encontrar un medicamento sustituto a Audiological scientist un formulario para que el seguro cubra el medicamento que se considera necesario.   Si se requiere una autorizacin previa para que su compaa de seguros malta su medicamento, por favor permtanos de 1 a 2 das hbiles para completar este  proceso.  Los precios de los medicamentos varan con frecuencia dependiendo del Environmental consultant de dnde se surte la receta y alguna farmacias pueden ofrecer precios ms baratos.  El sitio web www.goodrx.com tiene cupones para medicamentos de Health and safety inspector. Los precios aqu no tienen en cuenta lo que podra costar con la ayuda del seguro (puede ser ms barato con su seguro), pero el sitio web puede darle el precio si no utiliz Tourist information centre manager.  - Puede imprimir el cupn correspondiente y llevarlo con su receta a la farmacia.  - Tambin puede pasar por nuestra oficina durante el horario de atencin regular y Education officer, museum una tarjeta de cupones de GoodRx.  - Si necesita que su receta se enve electrnicamente a una farmacia diferente, informe a nuestra oficina a travs de MyChart de [place] o por telfono llamando al [phone] y presione la opcin 4.

## 2023-12-06 NOTE — Progress Notes (Signed)
 Follow-Up Visit   Subjective  Theodore [name] is a 89 y.o. male who presents for the following: Skin Cancer Screening and Upper Body Skin Exam  The patient presents for Upper Body Skin Exam (UBSE) for skin cancer screening and mole check. The patient has spots, moles and lesions to be evaluated, some may be new or changing and the patient may have concern these could be cancer.  Hx DN, MMis. Patient accompanied by wife who contributes to history. Patient has had multiple falls and has noticed enlarged veins at arms. Wife states that he will be moving into memory care at [facility] next week.  The following portions of the chart were reviewed this encounter and updated as appropriate: medications, allergies, medical history  Review of Systems:  No other skin or systemic complaints except as noted in HPI or Assessment and Plan.  Objective  Well appearing patient in no apparent distress; mood and affect are within normal limits.  All skin waist up examined. Relevant physical exam findings are noted in the Assessment and Plan.  L hand x 2, L forearm x 4, forehead x 1 (7) Erythematous stuck-on, waxy papule  Assessment & Plan   INFLAMED SEBORRHEIC KERATOSIS (7) L hand x 2, L forearm x 4, forehead x 1 (7) Recheck right forehead on f/up Destruction of lesion - L hand x 2, L forearm x 4, forehead x 1 (7)  Destruction method: cryotherapy   Informed consent: discussed and consent obtained   Lesion destroyed using liquid nitrogen: Yes   Region frozen until ice ball extended beyond lesion: Yes   Outcome: patient tolerated procedure well with no complications   Post-procedure details: wound care instructions given   Additional details:  Prior to procedure, discussed risks of blister formation, small wound, skin dyspigmentation, or rare scar following cryotherapy. Recommend Vaseline ointment to treated areas while healing.   Skin cancer screening performed today.  Actinic Damage - Chronic  condition, secondary to cumulative UV/sun exposure - diffuse scaly erythematous macules with underlying dyspigmentation - Recommend daily broad spectrum sunscreen SPF 30+ to sun-exposed areas, reapply every 2 hours as needed.  - Staying in the shade or wearing long sleeves, sun glasses (UVA+UVB protection) and wide brim hats (4-inch brim around the entire circumference of the hat) are also recommended for sun protection.  - Call for new or changing lesions.  Lentigines, Seborrheic Keratoses, Hemangiomas - Benign normal skin lesions - Benign-appearing - Call for any changes  Melanocytic Nevi - Tan-brown and/or pink-flesh-colored symmetric macules and papules - Benign appearing on exam today - Observation - Call clinic for new or changing moles - Recommend daily use of broad spectrum spf 30+ sunscreen to sun-exposed areas.   History of Melanoma - treated in the 90s. Right ear - No evidence of recurrence today - Recommend regular full body skin exams - Recommend daily broad spectrum sunscreen SPF 30+ to sun-exposed areas, reapply every 2 hours as needed.  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  - Call if any new or changing lesions are noted between office visits   History of Dysplastic Nevi Left lateral neck (severe 06/05/2022), L ant shoulder (severe 05/07/2017), R lower sternum (moderate 09/06/2020) - No evidence  of recurrence today - Recommend regular full body skin exams - Recommend daily broad spectrum sunscreen SPF 30+ to sun-exposed areas, reapply every 2 hours as needed.  - Call if any new or changing lesions are noted between office visits  Purpura - Chronic; persistent and recurrent.  Treatable, but not curable. -  Violaceous macules and patches - Benign - Related to trauma, age, sun damage and/or use of blood thinners, chronic use of topical and/or oral steroids - Observe - Can use OTC arnica containing moisturizer such as Dermend Bruise Formula if desired - Call for worsening or other concerns  EPIDERMAL INCLUSION CYST Exam: Subcutaneous nodule at L upper back  Benign-appearing. Exam most consistent with an epidermal inclusion cyst. Discussed that a cyst is a benign growth that can grow over time and sometimes get irritated or inflamed. Recommend observation if it is not bothersome. Discussed option of surgical excision to remove it if it is growing, symptomatic, or other changes noted. Please call for new or changing lesions so they can be evaluated.    Return in about 6 months (around 06/04/2024) for AK follow up, with Dr. Jackquline.  LILLETTE Lonell Drones, RMA, am acting as scribe for Rexene Jackquline, MD .   Documentation: I have reviewed the above documentation for accuracy and completeness, and I agree with the above.  Rexene Jackquline, MD

## 2023-12-13 ENCOUNTER — Other Ambulatory Visit: Payer: Self-pay | Admitting: Nurse Practitioner

## 2023-12-14 NOTE — Telephone Encounter (Signed)
Controlled substance database reviewed.  Medication sent to pharmacy.

## 2023-12-18 ENCOUNTER — Other Ambulatory Visit: Payer: Self-pay | Admitting: Internal Medicine

## 2023-12-19 ENCOUNTER — Telehealth: Payer: Self-pay

## 2023-12-19 ENCOUNTER — Other Ambulatory Visit: Payer: Self-pay | Admitting: Internal Medicine

## 2023-12-19 MED ORDER — BUSPIRONE HCL 10 MG PO TABS: 10.0000 mg | ORAL_TABLET | Freq: Three times a day (TID) | ORAL | 1 refills | Status: DC

## 2023-12-19 NOTE — Telephone Encounter (Signed)
 Copied from CRM #[id number]. Topic: General - Other >> Dec 19, 2023 10:03 AM Rosina BIRCH wrote: Reason for CRM: audrey from memory care called stating the patient is moving to their facility and they need to discuss medication with the doctor because she believes it will be a difficult move for him. She want to talk to the nurse about medication to help settle him [phone]

## 2023-12-19 NOTE — Telephone Encounter (Signed)
 Spoke with Gustav and she is concerned that the Alprazolam  0.25 mg tablet is not going to be enough to help keep pt calm this afternoon when he moves into memory care. She stated that she watches pt when he is in adult daycare and he does not listen to the nurses in there. She also stated that pt's wife is not going to come back to see him for a couple of days and is afraid that is going to upset him. His son is here and has agreed to visit pt for dinners but she is afraid that may trigger pt to think he is getting to go home or make him want to go home. I advised Gustav that Dr. Marylynn sent pt's wife a message stating that they should use the alprazolam  0.25 mg to help but Gustav is wanting to know if something else can be added and she suggested maybe buspar.

## 2023-12-19 NOTE — Telephone Encounter (Signed)
 Beatris Arrant, RN and advised her of the medication changes and she would like to know how often they can give pt the alprazolam  so they make sure they have it correct in their [facility]

## 2023-12-20 NOTE — Telephone Encounter (Signed)
 Deb, RN is aware of the medication instructions.

## 2023-12-21 NOTE — Telephone Encounter (Unsigned)
 Copied from CRM [id number]. Topic: General - Call Back - No Documentation >> Dec 20, 2023  4:13 PM Rea C wrote: Reason for CRM: Orlean, RN from [facility] calling to follow up on information that was faxed over for patient: form about discontinuation of xanax , orders for medications on patients list, and questions on whether the patient needs to continue a nebulizer or not.   [phone]

## 2023-12-24 NOTE — Telephone Encounter (Signed)
 Spoke with Theodore [name] and she stated that she has received all the information and orders that was needed. Nothing further is needed at this time.

## 2023-12-25 MED ORDER — ALPRAZOLAM 0.25 MG PO TABS: 0.2500 mg | ORAL_TABLET | Freq: Two times a day (BID) | ORAL | 5 refills | Status: DC

## 2023-12-25 NOTE — Progress Notes (Unsigned)
 Cardiology Clinic Note   Date: 12/27/2023 ID: Theodore [name], DOB 10/16/1929, MRN [id number]  Primary Cardiologist:  Evalene Lunger, MD  Chief Complaint   Theodore [name] is a 89 y.o. male who presents to the clinic today for routine follow up.   Patient Profile   Theodore [name] is followed by Dr. Gollan for the history outlined below.       Past medical history significant for: CAD. Found on CT 2018. PAD. ABI 10/15/2023: Mild right lower extremity arterial disease, moderate left lower extremity arterial disease. PVCs/PSVT. 14-day ZIO 07/13/2022: HR 40 to 148 bpm, average 53 bpm.  Predominantly sinus bradycardia.  1 run of NSVT lasting 17 beats with max rate 143 bpm.  19 runs of SVT fastest lasting 7 beats with max rate 148 bpm, longest lasting 11 beats average rate 114 bpm.  Rare ectopy.  4 triggered events associated with NSR. Echo 09/05/2022: EF 55 to 60%.  No RWMA.  Mild LVH.  Grade I DD.  Normal RV size/function.  Normal PA pressure, RVSP 32 mmHg.  Mild AI. Hyperlipidemia. Lipid panel 01/04/2023: Direct LDL 72, HDL 65, TG 78, total 152. COPD. GERD. Hypothyroidism. Dementia. CKD stage IIIb. Frequent falls.   In summary, patient establish care with cardiology in 2018 during hospitalization for dyspnea, weakness, chest tightness with findings of sinus bradycardia.  CT performed during hospital admission showed three-vessel coronary calcification.  Echo showed normal LV function.  Stress testing was a low risk study.  Subsequent monitor showed PVCs and sinus rhythm with average heart rates in the low 60s.  In June 2022 patient complained of fatigue, dyspnea, presyncope.  Repeat echo showed normal LV function with Grade I DD.  Outpatient monitoring November 2022 showed an average heart rate of 59 bpm with 15 runs of SVT.  Patient was seen in April 2024 for after near syncopal event during physical therapy.  While using the recumbent bicycle he became diaphoretic and lightheaded.  Event  monitoring showed predominantly sinus bradycardia as detailed above.  Echo showed normal LV/RV function with Grade I DD.  Patient was last seen in the office by Dr. Gollan on 09/25/2022 for follow-up after testing.  He had a recent fall in his bathroom sustaining a laceration to the back of his head requiring staples.  He continued to work with PT and was encouraged to continue his walking program.   Patient was last seen in the office by me on 06/29/2023 for routine follow-up.  Patient had suffered a fall 5 days prior when getting up in the middle of the night tripping and hitting his face on the carpet as well as possibly hitting the top of his head on something.  He did not want to get checked out by EMS when he told his wife about it the following morning.  Patient's wife reported 3 falls in the last month and many falls since the last time he was seen in July 2024.  Patient was having episodes of dizziness described as a room spinning and was referred to vestibular therapy.  He never attended the therapy as dizziness had resolved.  He was encouraged to consider vestibular therapy if dizziness returns and utilize rolling walker at all times.     History of Present Illness    Today, patient is accompanied by his wife. He is now residing in memory care as of 8 days ago. He continues to have frequent falls. Last fall was yesterday. He denies having lightheadedness, dizziness, presyncope,  palpitations or chest pain prior to falls. He ambulates with a rollator. He and his wife have no concerns or complaints today.     ROS: Limited secondary to patient with dementia.   EKGs/Labs Reviewed    EKG Interpretation Date/Time:  Thursday December 27 2023 13:48:26 EDT Ventricular Rate:  62 PR Interval:  168 QRS Duration:  74 QT Interval:  426 QTC Calculation: 432 R Axis:   41  Text Interpretation: Normal sinus rhythm Normal ECG When compared with ECG of 29-Jun-2023 14:08, No significant change was found  Confirmed by Loistine Sober [phone]) on 12/27/2023 1:52:21 PM   05/31/2023: ALT 17; AST 20; BUN 35; Creatinine, Ser 1.63; Potassium 5.0; Sodium 140   05/31/2023: Hemoglobin 13.5; WBC 4.3   05/31/2023: TSH 0.40    Physical Exam    VS:  BP 120/60 (BP Location: Left Arm, Patient Position: Sitting, Cuff Size: Normal)   Pulse 62   Ht 6' 3 (1.905 m)   Wt 159 lb 8 oz (72.3 kg)   SpO2 97%   BMI 19.94 kg/m  , BMI Body mass index is 19.94 kg/m.  GEN: Well nourished, well developed, in no acute distress. Neck: No JVD or carotid bruits. Cardiac:  RRR.  No murmur. No rubs or gallops.   Respiratory:  Respirations regular and unlabored. Clear to auscultation without rales, wheezing or rhonchi. GI: Soft, nontender, nondistended. Extremities: Radials/DP/PT 2+ and equal bilaterally. No clubbing or cyanosis. No edema  Skin: Warm and dry, no rash. Neuro: Strength intact.  Assessment & Plan   Coronary calcification Seen on CT 2018.  Low risk stress test March 2018.  Patient denies chest pain, pressure or tightness. EKG shows normal sinus rhythm 62 bpm.  - Continue Zetia .   PAD ABIs August 2025 demonstrated mild right lower extremity arterial disease and moderate left lower extremity arterial disease.  Patient does not complain about claudication today. - Continue Zetia . - Continue to follow with vascular surgery.   Bradycardia/PVCs/PSVT 14-day ZIO May 2024 demonstrated predominantly sinus bradycardia HR 40 to 148 bpm, average 53 bpm, 1 run of NSVT lasting 17 beats and 19 runs of SVT lasting up to 11 beats, rare ectopy, triggered events associated with NSR.  Patient denies palpitations, lightheadedness, dizziness, presyncope or syncope. - Continue to monitor.    Hyperlipidemia LDL 72 October 2024, at goal.  Patient continues to decline statin. - Continue Zetia .   Frequent falls/dementia Patient continues to have frequent falls. Last fall was one day ago. He denies lightheadedness,  dizziness, presyncope, palpitations or chest pain prior to falls. He is now residing in memory care.  - Encouraged ambulation with rollator at all times.   Disposition: Return in 1 year or sooner as needed. Discussed with wife following up as needed now that he is residing in memory care. She will consider that closer to the time of his follow up.          Signed, Sober HERO. Jay Haskew, DNP, NP-C

## 2023-12-25 NOTE — Addendum Note (Signed)
 Addended by: MARYLYNN VERNEITA CROME on: 12/25/2023 05:18 PM   Modules accepted: Orders

## 2023-12-25 NOTE — Telephone Encounter (Signed)
 noted

## 2023-12-25 NOTE — Telephone Encounter (Unsigned)
 Copied from CRM #[id number]. Topic: General - Other >> Dec 25, 2023  2:41 PM Thersia BROCKS wrote: Reason for CRM: audrey from memory care [facility] called and wanted to know  Can Dr.Tullo send and e script for the xanax  to [facility]

## 2023-12-26 ENCOUNTER — Telehealth: Payer: Self-pay | Admitting: Internal Medicine

## 2023-12-26 MED ORDER — ALPRAZOLAM 0.25 MG PO TABS: 0.2500 mg | ORAL_TABLET | Freq: Two times a day (BID) | ORAL | 5 refills | Status: DC

## 2023-12-26 NOTE — Telephone Encounter (Signed)
 Theodore [name] with [facility] called. She says that the original Rx of Xanax  was to give 1 tab 0.25 mg BID, then Dr. Tulo changed it to 2 tab = 0.5 mg po prn not to exceed twice a day. She says the one received yesterday is saying 0.25 mg 1 tab BID. She is wanting to know if Dr. Marylynn wants the patient to take Xanax  0.25 mg BID or Xanax  0.5 mg daily not to exceed twice a day. If there is a change a new Rx will be needed and a call to let them know so the system is updated for the patient. Advised I will send this to Dr. Tullo.    Copied from CRM #[id number]. Topic: Clinical - Medication Question >> Dec 26, 2023 10:56 AM Theodore [name] wrote: Reason for CRM: Is requesting a call back to confirm the patients Xanax  dosage. Theodore [name] from [facility] is requesting a call back at [phone].

## 2023-12-26 NOTE — Telephone Encounter (Signed)
 Spoke with Orlean to give her the updated directions of use for the Alprazolam  and let her know that a new prescriptions was sent to the pharmacy as well.

## 2023-12-26 NOTE — Telephone Encounter (Signed)
 I have resent the alprazolam  with the following directions:  0.25 mg twice daily (scheduled) May increase dose to 0.5 mg twice daily if needed for agitation

## 2023-12-26 NOTE — Telephone Encounter (Signed)
 Theodore [name] is needing clarification on the Alprazolam  rx. She is wanting to know if the alprazolam  needs to be scheduled for the 0.25 mg twice daily and then have a PRN script on hand or is it supposed to be PRN up to two times daily.

## 2023-12-27 ENCOUNTER — Ambulatory Visit: Attending: Student | Admitting: Student

## 2023-12-27 ENCOUNTER — Encounter: Payer: Self-pay | Admitting: Student

## 2023-12-27 ENCOUNTER — Encounter: Payer: Self-pay | Admitting: Internal Medicine

## 2023-12-27 ENCOUNTER — Non-Acute Institutional Stay (SKILLED_NURSING_FACILITY): Payer: Self-pay | Admitting: Internal Medicine

## 2023-12-27 VITALS — BP 120/60 | HR 62 | Ht 75.0 in | Wt 159.5 lb

## 2023-12-27 DIAGNOSIS — I493 Ventricular premature depolarization: Secondary | ICD-10-CM | POA: Diagnosis not present

## 2023-12-27 DIAGNOSIS — I471 Supraventricular tachycardia, unspecified: Secondary | ICD-10-CM | POA: Diagnosis not present

## 2023-12-27 DIAGNOSIS — E440 Moderate protein-calorie malnutrition: Secondary | ICD-10-CM

## 2023-12-27 DIAGNOSIS — C61 Malignant neoplasm of prostate: Secondary | ICD-10-CM

## 2023-12-27 DIAGNOSIS — E782 Mixed hyperlipidemia: Secondary | ICD-10-CM

## 2023-12-27 DIAGNOSIS — F0283 Dementia in other diseases classified elsewhere, unspecified severity, with mood disturbance: Secondary | ICD-10-CM | POA: Diagnosis not present

## 2023-12-27 DIAGNOSIS — F0390 Unspecified dementia without behavioral disturbance: Secondary | ICD-10-CM | POA: Diagnosis not present

## 2023-12-27 DIAGNOSIS — R001 Bradycardia, unspecified: Secondary | ICD-10-CM | POA: Diagnosis not present

## 2023-12-27 DIAGNOSIS — R296 Repeated falls: Secondary | ICD-10-CM | POA: Diagnosis not present

## 2023-12-27 DIAGNOSIS — R627 Adult failure to thrive: Secondary | ICD-10-CM

## 2023-12-27 DIAGNOSIS — G309 Alzheimer's disease, unspecified: Secondary | ICD-10-CM | POA: Diagnosis not present

## 2023-12-27 DIAGNOSIS — I2510 Atherosclerotic heart disease of native coronary artery without angina pectoris: Secondary | ICD-10-CM | POA: Diagnosis not present

## 2023-12-27 NOTE — Progress Notes (Signed)
 NURSING HOME LOCATION:  [facility], [place]  ROOM NUMBER:  [id number]  CODE STATUS:  DNR PCP: Verneita Kettering MD  This is a comprehensive admission note to this SNFperformed on this date less than 30 days from date of admission. Included are preadmission medical/surgical history; reconciled medication list; family history; social history and comprehensive review of systems.  Corrections and additions to the records were documented. Comprehensive physical exam was also performed. Additionally a clinical summary was entered for each active diagnosis pertinent to this admission in the Problem List to enhance continuity of care.  HPI: He was admitted to [facility] memory care unit on 12/19/2023.  Past medical and surgical history: Includes hypothyroidism; B12 deficiency; dyslipidemia; depression; benign essential tremor; history of colon polyps; COPD; PSVT; history of melanoma; and history of nephrolithiasis. Significant surgeries and procedures include bilateral patellaectomy; melanoma excision; EGD; and colonoscopy.  Family history: reviewed, non contributory due to advanced age.  Social history: Non-smoker; currently no alcohol intake.   Review of systems: Clinical neurocognitive deficits made validity of responses questionable , compromising ROS completion.  He could not tell me how long he had been at the facility and could not identify his PCP.  He made comments such as physically getting along but mentally. He did validate some dyspnea on exertion. Staff had requested an increase in his alprazolam  dose as he was very anxious and could not focus or be redirected.  Consideration for addition of BuSpar was discussed if the alprazolam  dose increase is ineffective.  Constitutional: No fever, significant weight change  Cardiovascular: No chest pain, palpitations, paroxysmal nocturnal dyspnea, claudication, edema  Respiratory: No cough, sputum production,  hemoptysis,  significant snoring, apnea  Gastrointestinal: No heartburn, dysphagia, abdominal pain, nausea /vomiting, rectal bleeding, melena Genitourinary: No dysuria, hematuria, pyuria Musculoskeletal: No joint stiffness, joint swelling, weakness, pain Dermatologic: No rash, pruritus, change in appearance of skin Neurologic: No dizziness, headache, syncope, seizures, numbness, tingling Hematologic/lymphatic: No significant bruising, lymphadenopathy, abnormal bleeding  Physical exam:  Pertinent or positive findings: Pattern alopecia is present.  Facies tend to be blank.  Hearing aids present bilaterally.  Eyes tend to be widely open.  There is slight erythema to the edge of the lids of the left eye without frank cellulitis.  The lids almost appear slightly everted.  Mouth remains agape for the most part.  He had difficulty with word retrieval during the interview.  Cheeks appear to be somewhat sunken.  He has bilateral hearing aids.  The cervical spine tends to be somewhat straight.  There is slight splitting of the first heart sound.  Pedal pulses are not palpable.  He has 1/2+ edema at the sock line.  Interosseous wasting and limb atrophy are present.  He uses a rolling walker.  When he began to walk without the walker,his gait was choppy and slightly unsteady.  General appearance:  no acute distress, increased work of breathing is present.   Lymphatic: No lymphadenopathy about the head, neck, axilla. Ears:  External ear exam shows no significant lesions or deformities.   Nose:  External nasal examination shows no deformity or inflammation. Nasal mucosa are pink and moist without lesions, exudates Neck:  No thyromegaly, masses, tenderness noted.    Heart:  Normal rate and regular rhythm.  S2 normal without gallop, murmur, click, rub.  Lungs:  without wheezes, rhonchi, rales, rubs. Abdomen: Bowel sounds are normal.  Abdomen is soft and nontender with no organomegaly, hernias, masses. GU:  Deferred  Extremities:  No cyanosis, clubbing Neurologic exam:  Balance, Rhomberg, finger to nose testing could not be completed due to clinical state Skin: Warm & dry w/o tenting. No significant lesions or rash.  See clinical summary under each active problem in the Problem List with associated updated therapeutic plan :  Protein-calorie malnutrition, moderate Interosseous wasting and limb atrophy on exam.  Nutritionist will evaluate at the SNF.  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  Staff states that he exhibits anxiety about his wife's status and appears somewhat depressed.  Continue to monitor and assess need for change in psychotropic medications.  Prostate cancer [facility]) The patient has no comprehension of the diagnosis.  Continue surveillance.  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  Continue prostate cancer surveillance.  Palliative or Hospice care would be considered as clinically indicated with progressive deterioration physically or mentally.

## 2023-12-27 NOTE — Assessment & Plan Note (Addendum)
 He exhibits word retrieval difficulty and can provide no meaningful history.  Staff states that he exhibits anxiety about his wife's status and appears somewhat depressed.  Continue to monitor and assess need for change in psychotropic medications.

## 2023-12-27 NOTE — Assessment & Plan Note (Addendum)
 DNR status is appropriate.  Continue prostate cancer surveillance.  Palliative or Hospice care would be considered as clinically indicated with progressive deterioration physically or mentally.

## 2023-12-27 NOTE — Telephone Encounter (Signed)
 Copied from CRM [id number]. Topic: General - Other >> Dec 27, 2023 11:11 AM Harlene ORN wrote: Reason for CRM: Nurse Gustav from [facility], called the PCP to let them know that they will be moving the patient to the Memory Care Unit. Please sign the Xanax  order that was sent over with the Provider's signature; sent on 12/26/2023 Phone: [phone]

## 2023-12-27 NOTE — Assessment & Plan Note (Signed)
 Interosseous wasting and limb atrophy on exam.  Nutritionist will evaluate at the SNF.

## 2023-12-27 NOTE — Patient Instructions (Signed)
.  Medication Instructions:  No changes *If you need a refill on your cardiac medications before your next appointment, please call your pharmacy*  Lab Work: None ordered If you have labs (blood work) drawn today and your tests are completely normal, you will receive your results only by: MyChart Message (if you have MyChart) OR A paper copy in the mail If you have any lab test that is abnormal or we need to change your treatment, we will call you to review the results.  Testing/Procedures: None ordered  Follow-Up: At [facility], you and your health needs are our priority.  As part of our continuing mission to provide you with exceptional heart care, our providers are all part of one team.  This team includes your primary Cardiologist (physician) and Advanced Practice Providers or APPs (Physician Assistants and Nurse Practitioners) who all work together to provide you with the care you need, when you need it.  Your next appointment:   12 month(s)  Provider:   You may see Timothy Gollan, MD or one of the following Advanced Practice Providers on your designated Care Team:   Barnie Hila, NP    We recommend signing up for the patient portal called MyChart.  Sign up information is provided on this After Visit Summary.  MyChart is used to connect with patients for Virtual Visits (Telemedicine).  Patients are able to view lab/test results, encounter notes, upcoming appointments, etc.  Non-urgent messages can be sent to your provider as well.   To learn more about what you can do with MyChart, go to [email or web address].

## 2023-12-27 NOTE — Assessment & Plan Note (Signed)
 The patient has no comprehension of the diagnosis.  Continue surveillance.

## 2024-01-01 NOTE — Telephone Encounter (Signed)
 Spoke with Gustav to let her know that I would refax them to the pharmacy. Also faxed them to her as well.

## 2024-01-01 NOTE — Telephone Encounter (Unsigned)
 Copied from CRM [id number]. Topic: General - Other >> Jan 01, 2024 11:29 AM Franky GRADE wrote: Gustav from [facility] is calling to follow up on this message, she states they have not received a response or follow up. Called [facility] and was informed to send a CRM.  >> Jan 01, 2024 10:52 AM Robinson H wrote: Gustav calling following up on order that needed to be signed for Xanax , advised Gustav per message from Harlene order was signed and faxed 10/21. Gustav states she will reach out to Pharmacy to verify they have received order.

## 2024-01-01 NOTE — Telephone Encounter (Signed)
 Signed and faxed

## 2024-01-03 NOTE — Telephone Encounter (Signed)
 Copied from CRM #[id number]. Topic: General - Call Back - No Documentation >> Jan 03, 2024 12:55 PM Alfonso ORN wrote: Reason for CRM: [facility] would like to relay message that faxes submitted today need to be signed and returned by EOD.   fax [phone]

## 2024-01-04 NOTE — Telephone Encounter (Signed)
 Requested forms were faxed back over to [facility] by [employer] on 01/03/24.

## 2024-01-05 ENCOUNTER — Other Ambulatory Visit: Payer: Self-pay | Admitting: Internal Medicine

## 2024-01-10 DIAGNOSIS — T161XXA Foreign body in right ear, initial encounter: Secondary | ICD-10-CM | POA: Diagnosis not present

## 2024-01-10 DIAGNOSIS — H903 Sensorineural hearing loss, bilateral: Secondary | ICD-10-CM | POA: Diagnosis not present

## 2024-01-10 DIAGNOSIS — H6123 Impacted cerumen, bilateral: Secondary | ICD-10-CM | POA: Diagnosis not present

## 2024-01-10 DIAGNOSIS — R1314 Dysphagia, pharyngoesophageal phase: Secondary | ICD-10-CM | POA: Diagnosis not present

## 2024-01-14 ENCOUNTER — Encounter: Payer: Self-pay | Admitting: Internal Medicine

## 2024-01-14 ENCOUNTER — Ambulatory Visit (INDEPENDENT_AMBULATORY_CARE_PROVIDER_SITE_OTHER): Admitting: Internal Medicine

## 2024-01-14 VITALS — BP 142/74 | HR 55 | Ht 75.0 in | Wt 163.4 lb

## 2024-01-14 DIAGNOSIS — F411 Generalized anxiety disorder: Secondary | ICD-10-CM | POA: Diagnosis not present

## 2024-01-14 DIAGNOSIS — R627 Adult failure to thrive: Secondary | ICD-10-CM

## 2024-01-14 DIAGNOSIS — G309 Alzheimer's disease, unspecified: Secondary | ICD-10-CM

## 2024-01-14 DIAGNOSIS — F0283 Dementia in other diseases classified elsewhere, unspecified severity, with mood disturbance: Secondary | ICD-10-CM | POA: Diagnosis not present

## 2024-01-14 MED ORDER — QUETIAPINE FUMARATE 50 MG PO TABS: 50.0000 mg | ORAL_TABLET | Freq: Every day | ORAL | 1 refills | Status: AC

## 2024-01-14 MED ORDER — ALPRAZOLAM 0.5 MG PO TABS: 0.5000 mg | ORAL_TABLET | Freq: Two times a day (BID) | ORAL | 2 refills | Status: DC

## 2024-01-14 MED ORDER — SERTRALINE HCL 100 MG PO TABS: 100.0000 mg | ORAL_TABLET | Freq: Every day | ORAL | 1 refills | Status: DC

## 2024-01-14 NOTE — Assessment & Plan Note (Signed)
 Simplifying regimen today as follows:  d/c wellbutrin  . Increase sertraline  to 100 mg daily.  Continue seroquel  50 mg at bedtime  dc buspirone.  Increase alprazolam  to 0.5 mg bid,  third dose prn agitation

## 2024-01-14 NOTE — Patient Instructions (Addendum)
 I am simplifying Theodore [name]'s regimen    I  have increased the sertraline  to 100 mg daily to help manage Faizaan's  anxiety   I have stopped the mirtazapine  and the wellbutrin    I will stop the buspirone and increase the  alprazolam   to 0.5 mg  scheduled twice daily , with a third dose PRN agitation  . If the staff feels that he needs more alprazolam  on a regular basis I can prescribe a long acting alprazolam  at a higher dose that is dose once daily

## 2024-01-14 NOTE — Progress Notes (Unsigned)
 Subjective:  Patient ID: Theodore [name], male    DOB: June 28, 1929  Age: 89 y.o. MRN: [id number]  CC: There were no encounter diagnoses.   HPI Theodore [name] presents for  Chief Complaint  Patient presents with   Medical Management of Chronic Issues    6 month follow up    Dementia:  now  living in Memory care at [facility].  Independent of ADL's.  Ambuates with walker  forgets to wear his hearing aids, has lost 3 HAs in the last 2 months. ENT has recommended  restricting  HAs to left ear only and reprogramming.  Last prchase 3 weeks ago, has already lost one.   GAD:  patient is concerned about his current regimen of medication : currently taking scheduled sertraline  75 mg daily  alprazolam  0.25 mg bid,  buspirone 10 mg tid,  and seroquel  50 mg at bedtime .  He denies problems currently,  but has remarked to [name] that he is getting medication all day long   Outpatient Medications Prior to Visit  Medication Sig Dispense Refill   ascorbic acid  (VITAMIN C ) 500 MG tablet Take 500 mg by mouth daily.     cycloSPORINE  (RESTASIS ) 0.05 % ophthalmic emulsion Place 1 drop into both eyes 2 (two) times daily.     ezetimibe  (ZETIA ) 10 MG tablet TAKE 1 TABLET BY MOUTH EVERY DAY 90 tablet 2   famotidine (PEPCID) 20 MG tablet Take 20 mg by mouth 2 (two) times daily.     Folic Acid  (FOLATE PO) Take 680 mcg by mouth daily.     levothyroxine  (SYNTHROID ) 100 MCG tablet TAKE 1 TABLET BY MOUTH EVERY DAY BEFORE BREAKFAST 90 tablet 3   Probiotic Product (MISC INTESTINAL FLORA REGULAT) CAPS Take by mouth.     vitamin B-12 (CYANOCOBALAMIN ) 500 MCG tablet Take 1 tablet by mouth daily.     ALPRAZolam  (XANAX ) 0.25 MG tablet Take 1 tablet (0.25 mg total) by mouth 2 (two) times daily. MAY INCREASE TO 0.5 MG DOSE TWICE IF NEEDED FOR AGITATION 120 tablet 5   buPROPion  (WELLBUTRIN  SR) 150 MG 12 hr tablet Take 1 tablet (150 mg total) by mouth in the morning. 90 tablet 1   busPIRone (BUSPAR) 10 MG tablet Take 1 tablet (10  mg total) by mouth 3 (three) times daily. 90 tablet 1   mirtazapine  (REMERON ) 7.5 MG tablet TAKE 1 TABLET BY MOUTH EVERYDAY AT BEDTIME 90 tablet 1   QUEtiapine  (SEROQUEL ) 50 MG tablet Take 1 tablet (50 mg total) by mouth at bedtime. 90 tablet 1   sertraline  (ZOLOFT ) 50 MG tablet Take 1.5 tablets (75 mg total) by mouth daily. 135 tablet 1   No facility-administered medications prior to visit.    Review of Systems;  Patient denies headache, fevers, malaise, unintentional weight loss, skin rash, eye pain, sinus congestion and sinus pain, sore throat, dysphagia,  hemoptysis , cough, dyspnea, wheezing, chest pain, palpitations, orthopnea, edema, abdominal pain, nausea, melena, diarrhea, constipation, flank pain, dysuria, hematuria, urinary  Frequency, nocturia, numbness, tingling, seizures,  Focal weakness, Loss of consciousness,  Tremor, insomnia, depression, anxiety, and suicidal ideation.      Objective:  BP (!) 142/74   Pulse (!) 55   Ht 6' 3 (1.905 m)   Wt 163 lb 6.4 oz (74.1 kg)   SpO2 99%   BMI 20.42 kg/m   BP Readings from Last 3 Encounters:  01/14/24 (!) 142/74  12/27/23 120/60  12/27/23 133/78    Wt Readings from  Last 3 Encounters:  01/14/24 163 lb 6.4 oz (74.1 kg)  12/27/23 159 lb 8 oz (72.3 kg)  11/27/23 161 lb 3.2 oz (73.1 kg)    Physical Exam  Lab Results  Component Value Date   HGBA1C 5.0 01/04/2023   HGBA1C 4.8 01/17/2021   HGBA1C 4.7 05/28/2018    Lab Results  Component Value Date   CREATININE 1.63 (H) 05/31/2023   CREATININE 1.29 01/04/2023   CREATININE 1.22 06/21/2022    Lab Results  Component Value Date   WBC 4.3 05/31/2023   HGB 13.5 05/31/2023   HCT 39.6 05/31/2023   PLT 229.0 05/31/2023   GLUCOSE 72 05/31/2023   CHOL 152 01/04/2023   TRIG 78.0 01/04/2023   HDL 65.20 01/04/2023   LDLDIRECT 72.0 01/04/2023   LDLCALC 71 01/04/2023   ALT 17 05/31/2023   AST 20 05/31/2023   NA 140 05/31/2023   K 5.0 05/31/2023   CL 104 05/31/2023    CREATININE 1.63 (H) 05/31/2023   BUN 35 (H) 05/31/2023   CO2 29 05/31/2023   TSH 0.40 05/31/2023   PSA 21.68 (H) 02/11/2021   INR 1.4 (H) 11/29/2021   HGBA1C 5.0 01/04/2023   MICROALBUR 0.4 10/18/2022    No results found.  Assessment & Plan:  .There are no diagnoses linked to this encounter.   I spent 34 minutes on the day of this face to face encounter reviewing patient's  most recent visit with cardiology,  nephrology,  and neurology,  prior relevant surgical and non surgical procedures, recent  labs and imaging studies, counseling on weight management,  reviewing the assessment and plan with patient, and post visit ordering and reviewing of  diagnostics and therapeutics with patient  .   Follow-up: No follow-ups on file.   Verneita LITTIE Kettering, MD

## 2024-01-15 NOTE — Assessment & Plan Note (Signed)
 Simplification of medication regimen needed.  Increase sertraline  to 100 mg daily  stopp wellbutirn given increased anxiety  stop buspirone and increase alprazolam  to 0.5 mg bid

## 2024-01-15 NOTE — Assessment & Plan Note (Signed)
 Secondary to dementia complicated by anxiety . He has gained 4 lbs since moving to [facility].  DNR order in place

## 2024-01-21 ENCOUNTER — Other Ambulatory Visit: Payer: Self-pay | Admitting: Internal Medicine

## 2024-01-21 MED ORDER — BUSPIRONE HCL 10 MG PO TABS: 10.0000 mg | ORAL_TABLET | Freq: Three times a day (TID) | ORAL | 1 refills | Status: DC

## 2024-01-24 ENCOUNTER — Telehealth: Payer: Self-pay

## 2024-01-24 NOTE — Telephone Encounter (Signed)
 Signed by Leron Glance, NP and faxed back.

## 2024-01-24 NOTE — Telephone Encounter (Signed)
 Copied from CRM [id number]. Topic: General - Other >> Jan 24, 2024  2:10 PM Theodore [name] wrote: Reason for CRM: Gustav stated that she will be faxing over forms for an antibacterial request due to the pt having a sore on his knee and looks like its getting infected.

## 2024-02-02 ENCOUNTER — Other Ambulatory Visit: Payer: Self-pay | Admitting: Internal Medicine

## 2024-02-05 ENCOUNTER — Ambulatory Visit: Admitting: Podiatry

## 2024-02-05 ENCOUNTER — Encounter: Payer: Self-pay | Admitting: Podiatry

## 2024-02-05 VITALS — Ht 75.0 in | Wt 163.4 lb

## 2024-02-05 DIAGNOSIS — B351 Tinea unguium: Secondary | ICD-10-CM | POA: Diagnosis not present

## 2024-02-05 DIAGNOSIS — M79675 Pain in left toe(s): Secondary | ICD-10-CM

## 2024-02-05 DIAGNOSIS — M79674 Pain in right toe(s): Secondary | ICD-10-CM | POA: Diagnosis not present

## 2024-02-13 ENCOUNTER — Telehealth: Payer: Self-pay

## 2024-02-13 ENCOUNTER — Encounter: Payer: Self-pay | Admitting: Internal Medicine

## 2024-02-13 ENCOUNTER — Ambulatory Visit: Admitting: Internal Medicine

## 2024-02-13 VITALS — BP 136/64 | HR 66 | Ht 75.0 in | Wt 168.0 lb

## 2024-02-13 DIAGNOSIS — R1310 Dysphagia, unspecified: Secondary | ICD-10-CM

## 2024-02-13 DIAGNOSIS — G309 Alzheimer's disease, unspecified: Secondary | ICD-10-CM

## 2024-02-13 DIAGNOSIS — F0283 Dementia in other diseases classified elsewhere, unspecified severity, with mood disturbance: Secondary | ICD-10-CM | POA: Diagnosis not present

## 2024-02-13 DIAGNOSIS — M6281 Muscle weakness (generalized): Secondary | ICD-10-CM

## 2024-02-13 DIAGNOSIS — F411 Generalized anxiety disorder: Secondary | ICD-10-CM

## 2024-02-13 MED ORDER — ALPRAZOLAM 1 MG PO TABS: 1.0000 mg | ORAL_TABLET | Freq: Two times a day (BID) | ORAL | 5 refills | Status: DC

## 2024-02-13 MED ORDER — SERTRALINE HCL 100 MG PO TABS: 150.0000 mg | ORAL_TABLET | Freq: Every day | ORAL | 5 refills | Status: DC

## 2024-02-13 NOTE — Patient Instructions (Addendum)
 Jovaun's anxiety is not adequately controlled if he is pacing constantly.   I am increasing the sertaline dose to 150 mg and the alprazolam  dose to 1 mg twice daily as a TRIAL  I am also ordering physical therapy to work on his balance

## 2024-02-13 NOTE — Progress Notes (Unsigned)
 Subjective:  Patient ID: Theodore [name], male    DOB: [birth year]  Age: 89 y.o. MRN: [id number]  CC: There were no encounter diagnoses.   HPI DONTAYE HUR presents for  Chief Complaint  Patient presents with   Medical Management of Chronic Issues   Follow up on dementia with agitation, decreased mobility,  and frequent falls  due to loss of balance.   Recent transition to Memory Care,  Patient has been more confused and agitated per staff. Most recent fall occurred today , witnessed ,  no injuries  has been observed to pace the floor for hours daily, looking for his wife.   Not cooperative with the staff at [facility] which he goes to 2/week,    On a dysphagia diet .     Outpatient Medications Prior to Visit  Medication Sig Dispense Refill   ALPRAZolam  (XANAX ) 0.5 MG tablet Take 1 tablet (0.5 mg total) by mouth 2 (two) times daily. MAY give additional dose  0.5 MG daily  IF NEEDED FOR AGITATION 90 tablet 2   ascorbic acid  (VITAMIN C ) 500 MG tablet Take 500 mg by mouth daily.     busPIRone  (BUSPAR ) 10 MG tablet Take 1 tablet (10 mg total) by mouth 3 (three) times daily. 90 tablet 1   cycloSPORINE  (RESTASIS ) 0.05 % ophthalmic emulsion Place 1 drop into both eyes 2 (two) times daily.     ezetimibe  (ZETIA ) 10 MG tablet TAKE 1 TABLET BY MOUTH EVERY DAY 90 tablet 2   famotidine (PEPCID) 20 MG tablet Take 20 mg by mouth 2 (two) times daily.     Folic Acid  (FOLATE PO) Take 680 mcg by mouth daily.     levothyroxine  (SYNTHROID ) 100 MCG tablet TAKE 1 TABLET BY MOUTH EVERY DAY BEFORE BREAKFAST 90 tablet 3   Probiotic Product (MISC INTESTINAL FLORA REGULAT) CAPS Take by mouth.     QUEtiapine  (SEROQUEL ) 50 MG tablet Take 1 tablet (50 mg total) by mouth at bedtime. 90 tablet 1   sertraline  (ZOLOFT ) 100 MG tablet Take 1 tablet (100 mg total) by mouth daily. 90 tablet 1   vitamin B-12 (CYANOCOBALAMIN ) 500 MCG tablet Take 1 tablet by mouth daily.     No facility-administered medications prior to  visit.    Review of Systems;  Patient denies headache, fevers, malaise, unintentional weight loss, skin rash, eye pain, sinus congestion and sinus pain, sore throat, dysphagia,  hemoptysis , cough, dyspnea, wheezing, chest pain, palpitations, orthopnea, edema, abdominal pain, nausea, melena, diarrhea, constipation, flank pain, dysuria, hematuria, urinary  Frequency, nocturia, numbness, tingling, seizures,  Focal weakness, Loss of consciousness,  Tremor, insomnia, depression, anxiety, and suicidal ideation.      Objective:  There were no vitals taken for this visit.  BP Readings from Last 3 Encounters:  01/14/24 (!) 142/74  12/27/23 120/60  12/27/23 133/78    Wt Readings from Last 3 Encounters:  02/05/24 163 lb 6.4 oz (74.1 kg)  01/14/24 163 lb 6.4 oz (74.1 kg)  12/27/23 159 lb 8 oz (72.3 kg)    Physical Exam  Lab Results  Component Value Date   HGBA1C 5.0 01/04/2023   HGBA1C 4.8 01/17/2021   HGBA1C 4.7 05/28/2018    Lab Results  Component Value Date   CREATININE 1.63 (H) 05/31/2023   CREATININE 1.29 01/04/2023   CREATININE 1.22 06/21/2022    Lab Results  Component Value Date   WBC 4.3 05/31/2023   HGB 13.5 05/31/2023   HCT 39.6 05/31/2023  PLT 229.0 05/31/2023   GLUCOSE 72 05/31/2023   CHOL 152 01/04/2023   TRIG 78.0 01/04/2023   HDL 65.20 01/04/2023   LDLDIRECT 72.0 01/04/2023   LDLCALC 71 01/04/2023   ALT 17 05/31/2023   AST 20 05/31/2023   NA 140 05/31/2023   K 5.0 05/31/2023   CL 104 05/31/2023   CREATININE 1.63 (H) 05/31/2023   BUN 35 (H) 05/31/2023   CO2 29 05/31/2023   TSH 0.40 05/31/2023   PSA 21.68 (H) 02/11/2021   INR 1.4 (H) 11/29/2021   HGBA1C 5.0 01/04/2023   MICROALBUR 0.4 10/18/2022    No results found.  Assessment & Plan:  .There are no diagnoses linked to this encounter.   I spent 34 minutes on the day of this face to face encounter reviewing patient's  most recent visit with cardiology,  nephrology,  and neurology,  prior  relevant surgical and non surgical procedures, recent  labs and imaging studies, counseling on weight management,  reviewing the assessment and plan with patient, and post visit ordering and reviewing of  diagnostics and therapeutics with patient  .   Follow-up: No follow-ups on file.   Verneita LITTIE Kettering, MD

## 2024-02-13 NOTE — Addendum Note (Signed)
 Addended by: MARYLYNN VERNEITA CROME on: 02/13/2024 05:02 PM   Modules accepted: Orders

## 2024-02-13 NOTE — Telephone Encounter (Signed)
 Copied from CRM [id number]. Topic: Clinical - Prescription Issue >> Feb 13, 2024  3:55 PM Deaijah H wrote: Reason for CRM: Shirlen w/ [employer] regarding a new prescription that was sent to pharmacy. They're  stating she will need to rewrite the script due to directions Take 1 tablet (1 mg total) by mouth 2 (two) times daily. MAY give additional dose  0.5 MG daily  IF NEEDED FOR AGITATION.SABRA Lamp (pharmacy) [id number]

## 2024-02-14 ENCOUNTER — Telehealth: Payer: Self-pay

## 2024-02-14 MED ORDER — ACETAMINOPHEN 500 MG PO CAPS: 1000.0000 mg | ORAL_CAPSULE | Freq: Two times a day (BID) | ORAL | 11 refills | Status: DC

## 2024-02-14 NOTE — Assessment & Plan Note (Signed)
 Resulting in recurrent falls.  Complicated by his dementia and refusal to use the rolling walker at times. PT ordered

## 2024-02-14 NOTE — Telephone Encounter (Signed)
 Copied from CRM #[id number]. Topic: Clinical - Medication Question >> Feb 14, 2024  9:34 AM Mercedes MATSU wrote: Reason for CRM:  Nurse Gustav called in and is requesting frequency for the Tylenol  that Dr Tullo prescribed the patient. She can be reached directly at [phone].

## 2024-02-14 NOTE — Assessment & Plan Note (Signed)
 Regimen to treat anxiety addressed.  Letter to support permanently removing him from jury duty summons written

## 2024-02-14 NOTE — Assessment & Plan Note (Signed)
.   Reviewed behavioral modifications to prevent aspiration and choking .  Now on dysphagia diet with thickened liquids.  Gaining weight (needed)

## 2024-02-14 NOTE — Telephone Encounter (Signed)
 noted

## 2024-02-14 NOTE — Assessment & Plan Note (Signed)
 Complicated by dementia.  Staff reports that he paces the floor for hours looking for hiw wife. Theodore [name].  Will increas sertraline  as a trial fo 150 mg daily,  and increase  alprazolam  to 1.0 mg bid,  0.5 mg additional prn agitation daily

## 2024-02-14 NOTE — Addendum Note (Signed)
 Addended by: MARYLYNN VERNEITA CROME on: 02/14/2024 03:00 PM   Modules accepted: Orders

## 2024-02-15 NOTE — Telephone Encounter (Signed)
 Nurse at [facility] is aware.

## 2024-02-17 NOTE — Progress Notes (Signed)
 Chief Complaint  Patient presents with   Nail Problem    PT IS HERE FOR rfc.    SUBJECTIVE Patient presents to office today complaining of elongated, thickened nails that cause pain while ambulating in shoes.  Patient is unable to trim their own nails.  Patient continues to have a preulcerative callus lesion overlying the DIPJ of the left third toe.  Patient is here for further evaluation and treatment.  Past Medical History:  Diagnosis Date   Actinic keratosis    Allergy 1990s   Penicillin   Anxiety 1946   Arthritis    knees   Asymptomatic Sinus bradycardia    a. 05/2016 Zio: Avg HR 61 (41-167).   Benign essential tremor    Cancer (HCC) 2002   melanoma right ear   Chronic kidney disease    Mild   Colon polyp    COPD (chronic obstructive pulmonary disease) (HCC)    pt said he believes it is a misdiagnosis    Coronary artery calcification seen on CT scan    a. 06/2016 CTA Chest: cor Ca2+; b. 05/2017 MV: EF >65%. No ischemia/infarct.   COVID-19 virus infection 22-Apr-2020   Depression 1946   Death of brother   Dysplastic nevus 04/25/2022   Left lateral neck, severe, shave removal 06/05/22   GERD (gastroesophageal reflux disease)    History of echocardiogram    a. 04/2016 Echo: EF 60-65%, mild conc LVH. Nl PASP.   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 05/2016 Zio: Avg HR 61 (41-167). 9 SVT runs (fastest 167 - 5 beats; longest 8 beats - 101 bpm). Rare PACs/PVCs.   Pre-syncope    a. 06/2022 Zio: Predominantly sinus bradycardia with an average heart rate of 53 (40-148).  1 run of NSVT x 17 beats (143 bpm), 19 SVT runs (fastest 148 x 7  beats; longest 11 beats @ 114 bpm).  Rare PVCs/PACs. Triggered events assoc w/ sinus rhythym.   PSVT (paroxysmal supraventricular tachycardia)    a. 05/2016 Zio: 9 SVT runs; b. 06/2022 Zio: 19 SVT runs, fastest 148, longest 11 beats.   PVC's (premature ventricular contractions)    Rhinitis    Sepsis due to cellulitis (HCC) 11/28/2021   Severe sepsis (HCC) 11/28/2021   Wears dentures    partial upper and lower   Wears hearing aid in both ears     Allergies  Allergen Reactions   Penicillins Rash and Other (See Comments)    Other reaction(s): Other (see comments) Other reaction(s): UNKNOWN   Benzonatate      Pruritic rash   Lexapro  [Escitalopram ] Other (See Comments)    Increased hand tremor   Molnupiravir      Pruritic rash on neck      OBJECTIVE General Patient is awake, alert, and oriented x 3 and in no acute distress. Derm Skin is dry and supple bilateral. Negative open lesions or macerations. Remaining integument unremarkable. Nails are tender, long, thickened and dystrophic with subungual debris, consistent with onychomycosis, 1-5 bilateral. No signs of infection noted. Callus lesion noted overlying the  DIPJ of the left third toe.  After debridement there is a superficial wound measuring approximately 0.2 x 0.2 x 0.1 cm.  Partial thickness limited to breakdown of the skin. Vasc  DP and PT pedal pulses palpable bilaterally. Temperature gradient within normal limits.  Neuro grossly intact via light touch Musculoskeletal Exam hammertoe deformity noted to the lesser digits bilateral  ASSESSMENT 1.  Pain due to onychomycosis of toenails both 2.  Superficial wound DIPJ left third toe 3.  Hammertoe contracture lesser digits bilateral  PLAN OF CARE -Patient evaluated today.  -Today we discussed the pathology and etiology of the hammertoes causing excessive pressure to the distal tips of the toes.  The patient is not interested in surgical intervention at this time.  Recommend  conservative care including good supportive tennis shoes and the bicycles going barefoot. -Instructed to maintain good pedal hygiene and foot care.  -Mechanical debridement of nails 1-5 bilaterally performed using a nail nipper. Filed with dremel without incident.  -Light debridement of the superficial wound was performed today using a tissue nipper.  Recommend triple antibiotic and a Band-Aid daily to protect the toe from the shoes  -Return to clinic in 3 mos. routine footcare   Thresa EMERSON Sar, DPM [facility]  Dr. Thresa EMERSON Sar, DPM    [address] [place], [place] [address]                Office [phone]  Fax [phone]

## 2024-02-18 ENCOUNTER — Telehealth: Payer: Self-pay

## 2024-02-18 DIAGNOSIS — F0283 Dementia in other diseases classified elsewhere, unspecified severity, with mood disturbance: Secondary | ICD-10-CM

## 2024-02-18 NOTE — Telephone Encounter (Signed)
 Is it okay to place an OT referral?

## 2024-02-18 NOTE — Telephone Encounter (Signed)
 Copied from CRM [id number]. Topic: Referral - Question >> Feb 18, 2024  2:38 PM Brittany M wrote: Reason for CRM: Jerel from [facility]-Wanting to get OT referral faxed to [phone]

## 2024-02-18 NOTE — Addendum Note (Signed)
 Addended by: Mirra Basilio on: 02/18/2024 04:50 PM   Modules accepted: Orders

## 2024-02-18 NOTE — Telephone Encounter (Signed)
 Referral has been submitted

## 2024-02-20 ENCOUNTER — Ambulatory Visit: Payer: Self-pay

## 2024-02-20 NOTE — Telephone Encounter (Signed)
 Spoke with pt [facility] and they stated that the pharmay was still needing clarification on the scheduled dose and the frequency of the PRN rx. I let [facility] know that I would call the pharmacy and with the clarification then give them a call back. Spoke with [facility] pharmacy and they stated that they did not need any further clarification on the tylenol  rx and they see that the PRN rx was discontinued in November. Called [facility] back to let them know as well.

## 2024-02-20 NOTE — Telephone Encounter (Unsigned)
 Copied from CRM #[id number]. Topic: Clinical - Medication Question >> Feb 20, 2024  1:31 PM Berneda FALCON wrote: Reason for CRM: Dedra at [facility] would like to request PRN order for Tylenol  for patient. No current symptoms but has intermittent mild pain.  Carmen-[phone] [phone]  Can fax over order if approved.

## 2024-02-20 NOTE — Telephone Encounter (Signed)
 FYI Only or Action Required?: FYI only for provider: appointment scheduled on 12/15.  Patient was last seen in primary care on 02/13/2024 by Marylynn Verneita CROME, MD.  Called Nurse Triage reporting Knee Pain.  Symptoms began several months ago.  Interventions attempted: Rest, hydration, or home remedies.  Symptoms are: gradually worsening.  Triage Disposition: See PCP When Office is Open (Within 3 Days)  Patient/caregiver understands and will follow disposition?: Yes  Copied from CRM #[id number]. Topic: Clinical - Red Word Triage >> Feb 20, 2024  3:43 PM Jayma L wrote: Red Word that prompted transfer to Nurse Triage: [facility] calling  - nurse shirly - pain meds needs - cant walk , left knee, legs give out on him. Pain is getting worse and serve Reason for Disposition  [1] MODERATE pain (e.g., interferes with normal activities, limping) AND [2] present > 3 days  Answer Assessment - Initial Assessment Questions Wheelchair the last few days- prior would use Rollator. Has had multiple falls especially when trying to walk without assistance. At memory care facility. Spoke with wife.   Appt made for Monday with [facility] to assess symptoms. ED precautions understood and wife will discuss further with memory care facility.   1. LOCATION and RADIATION: Where is the pain located?      Left knee pain  2. QUALITY: What does the pain feel like?  (e.g., sharp, dull, aching, burning)     Unsure if radiating- wife states maybe radiates down to the foot  3. SEVERITY: How bad is the pain? What does it keep you from doing?   (Scale 1-10; or mild, moderate, severe)     Complains about it a lot.  4. ONSET: When did the pain start? Does it come and go, or is it there all the time?  knee pain for months - falls frequently  5. RECURRENT: Have you had this pain before? If Yes, ask: When, and what happened then?     For the last few months  6. SETTING: Has there been any recent work, exercise or  other activity that involved that part of the body?      Worsened with falls  7. AGGRAVATING FACTORS: What makes the knee pain worse? (e.g., walking, climbing stairs, running)     Falls  8. ASSOCIATED SYMPTOMS: Is there any swelling or redness of the knee?     Redness from a fall.  9. OTHER SYMPTOMS: Do you have any other symptoms? (e.g., calf pain, chest pain, difficulty breathing, fever)     Denies all  Protocols used: Knee Pain-A-AH

## 2024-02-21 NOTE — Telephone Encounter (Signed)
 Pt is scheduled for the knee pain at [facility] on Monday.

## 2024-02-25 ENCOUNTER — Ambulatory Visit: Admitting: Family Medicine

## 2024-02-28 ENCOUNTER — Telehealth: Payer: Self-pay | Admitting: Family Medicine

## 2024-02-28 ENCOUNTER — Ambulatory Visit: Admitting: Family Medicine

## 2024-02-28 NOTE — Progress Notes (Deleted)
° °  Acute Office Visit  Patient ID: Theodore [name], male    DOB: 02-22-1930, 89 y.o.   MRN: [id number]  PCP: Marylynn Verneita CROME, MD  No chief complaint on file.   Subjective:     HPI  Discussed the use of AI scribe software for clinical note transcription with the patient, who gave verbal consent to proceed.  History of Present Illness    ROS     Objective:    There were no vitals taken for this visit. BP Readings from Last 3 Encounters:  02/13/24 136/64  01/14/24 (!) 142/74  12/27/23 120/60   Wt Readings from Last 3 Encounters:  02/13/24 168 lb (76.2 kg)  02/05/24 163 lb 6.4 oz (74.1 kg)  01/14/24 163 lb 6.4 oz (74.1 kg)      Physical Exam  Physical Exam     No results found for any visits on 02/28/24.     Assessment & Plan:   Problem List Items Addressed This Visit   None   Assessment and Plan Assessment & Plan     No orders of the defined types were placed in this encounter.   No follow-ups on file.  Rockie Agent, MD [facility]

## 2024-02-29 ENCOUNTER — Non-Acute Institutional Stay: Payer: Self-pay | Admitting: Internal Medicine

## 2024-02-29 ENCOUNTER — Ambulatory Visit: Admitting: Internal Medicine

## 2024-02-29 ENCOUNTER — Encounter: Payer: Self-pay | Admitting: Internal Medicine

## 2024-02-29 DIAGNOSIS — E039 Hypothyroidism, unspecified: Secondary | ICD-10-CM | POA: Diagnosis not present

## 2024-02-29 DIAGNOSIS — R627 Adult failure to thrive: Secondary | ICD-10-CM | POA: Diagnosis not present

## 2024-02-29 DIAGNOSIS — E785 Hyperlipidemia, unspecified: Secondary | ICD-10-CM | POA: Diagnosis not present

## 2024-02-29 DIAGNOSIS — Z7189 Other specified counseling: Secondary | ICD-10-CM

## 2024-02-29 DIAGNOSIS — K219 Gastro-esophageal reflux disease without esophagitis: Secondary | ICD-10-CM

## 2024-02-29 DIAGNOSIS — J438 Other emphysema: Secondary | ICD-10-CM

## 2024-02-29 DIAGNOSIS — F02B11 Dementia in other diseases classified elsewhere, moderate, with agitation: Secondary | ICD-10-CM | POA: Diagnosis not present

## 2024-02-29 DIAGNOSIS — G301 Alzheimer's disease with late onset: Secondary | ICD-10-CM

## 2024-02-29 DIAGNOSIS — F411 Generalized anxiety disorder: Secondary | ICD-10-CM

## 2024-02-29 DIAGNOSIS — N1832 Chronic kidney disease, stage 3b: Secondary | ICD-10-CM | POA: Diagnosis not present

## 2024-02-29 MED ORDER — BREXPIPRAZOLE 1 MG PO TABS: ORAL_TABLET | ORAL | Status: DC

## 2024-02-29 NOTE — Assessment & Plan Note (Addendum)
 Patient currently on Seroquel  50 mg at bedtime.  Will start Rexulti  0.5 mg every morning for a week then increase to 1 mg every morning.  Will monitor to see how his agitation is.  May have to stop Seroquel  if pt gets too sedated.  Since we are taking over his care at memory care as his primary provider, we have not had the opportunity to perform any gradual dose reductions as this is the first time we are assuming care as his primary care physician.

## 2024-02-29 NOTE — Assessment & Plan Note (Signed)
 Stable COPD.  He is not requiring any inhalers.

## 2024-02-29 NOTE — Assessment & Plan Note (Signed)
 Patient has failure to thrive syndrome.  Likely due to his advanced age and poor appetite.

## 2024-02-29 NOTE — Assessment & Plan Note (Signed)
Continue Synthroid 100 mcg daily

## 2024-02-29 NOTE — Assessment & Plan Note (Signed)
-  Continue Pepcid 20 mg twice daily.

## 2024-02-29 NOTE — Assessment & Plan Note (Signed)
 Patient has anxiety along with dementia.  He is already on sertraline  150 mg daily.,  BuSpar  10 mg 3 times a day, Xanax  1 mg every 12 hours scheduled and 0.5 mg daily as needed increased anxiety.  Hopefully the Rexulti  that is being started will also help with his anxiety.

## 2024-02-29 NOTE — Assessment & Plan Note (Signed)
 Last BUN/creatinine in August 2025 BUN of 32, creatinine 1.6

## 2024-02-29 NOTE — Assessment & Plan Note (Signed)
 Patient's DNR sheet is completed and the DNR binder at the nurses station.

## 2024-02-29 NOTE — Progress Notes (Signed)
 [facility] Routine Care Progress Note    Location:  Other [place].  Nursing Home Room Number: [facility] [id number] Place of Service:  ALF [phone]) Camellia Door, DO   PCP: Marylynn Verneita CROME, MD   Patient Care Team: Marylynn Verneita CROME, MD as PCP - General (Internal Medicine) Perla Evalene PARAS, MD as PCP - Cardiology (Cardiology) Maree Jannett POUR, MD as Consulting Physician (Neurology) Delores Orvin BRAVO, NP as Nurse Practitioner (Vascular Surgery) Isaiah Scrivener, MD as Consulting Physician (Pulmonary Disease) Francisca Redell BROCKS, MD as Consulting Physician (Urology) Schulz, Zachary R, PA-C as Physician Assistant (Physician Assistant)   Extended Emergency Contact Information Primary Emergency Contact: Greenstreet,[name] Address: [address]          [place], [place] [address] [place] Home Phone: [phone] Mobile Phone: [phone] Relation: Spouse   Goals of care: Advanced Directive information    02/29/2024    8:43 AM  Advanced Directives  Does Patient Have a Medical Advance Directive? Yes  Type of Estate Agent of [place];Out of facility DNR (pink MOST or yellow form)  Does patient want to make changes to medical advance directive? No - Patient declined  Copy of Healthcare Power of Attorney in Chart? Yes - validated most recent copy scanned in chart (See row information)     CODE STATUS: Do Not Resuscitate (DNR)   Chief Complaint  Patient presents with   Agitation    Agitation.      HPI: Pt is a 89 y.o. male seen today for an routine/acute visit for Agitation and to assume medical care of patient.  Patient is a 89 year old male former patient of Verneita Marylynn, MD who is being seen today for worsening agitation due to his dementia and to assume his medical care.  Patient had been seen by Dr. Tish in October 2025 for adult failure to thrive.  However the family decided not to use [facility] for the patient's primary care and he was seen  back in the office by Dr. Marylynn on January 14, 2024.  Nursing reports that they are having difficulty getting hold of Dr. Tullo for orders and routine care.  Ultimately the family called the nurse back and wanted [facility] to assume his primary care at [facility].  Nursing reports the patient gets very agitated for very little reason.  He is not violent but is argumentative.  He is impulsive and tries to stand up without assistance.  He is at danger for falling numerous times but has not due to the assistance of the aides and nurses in memory care.  Patient unable to give me the date or the time.  He does not know its Christmas season.  He is quite irritable.  CNA just helped him change his what and urine soaked underwear.  He resists attempts to help him ambulate with his wheelchair that he walks behind.  .   Past Medical History:  Diagnosis Date   Actinic keratosis    Allergy 1990s   Penicillin   Anxiety 1946   Arthritis    knees   Asymptomatic Sinus bradycardia    a. 05/2016 Zio: Avg HR 61 (41-167).   Benign essential tremor    Cancer (HCC) 2002   melanoma right ear   Chronic kidney disease    Mild   Colon polyp    COPD (chronic obstructive pulmonary disease) (HCC)    pt said he believes it is a misdiagnosis  Coronary artery calcification seen on CT scan    a. 06/2016 CTA Chest: cor Ca2+; b. 05/2017 MV: EF >65%. No ischemia/infarct.   COVID-19 virus infection 2020-04-18   Depression 1946   Death of brother   Dysplastic nevus 04/25/2022   Left lateral neck, severe, shave removal 06/05/22   GERD (gastroesophageal reflux disease)    History of echocardiogram    a. 04/2016 Echo: EF 60-65%, mild conc LVH. Nl PASP.   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 05/2016 Zio: Avg HR 61 (41-167). 9 SVT runs (fastest 167 - 5 beats; longest 8 beats - 101 bpm). Rare PACs/PVCs.   Pre-syncope    a. 06/2022 Zio: Predominantly sinus bradycardia with an average heart rate of 53 (40-148).  1 run of NSVT x 17 beats (143 bpm), 19 SVT runs (fastest 148 x 7 beats; longest 11 beats @ 114 bpm).  Rare PVCs/PACs. Triggered events assoc w/ sinus rhythym.   PSVT (paroxysmal supraventricular tachycardia)    a. 05/2016 Zio: 9 SVT runs; b. 06/2022 Zio: 19 SVT runs, fastest 148, longest 11 beats.   PVC's (premature ventricular contractions)    Rhinitis    Sepsis due to cellulitis (HCC) 11/28/2021   Severe sepsis (HCC) 11/28/2021   Wears dentures    partial upper and lower   Wears hearing aid in both ears    Past Surgical History:  Procedure Laterality Date   ADENOIDECTOMY     BRAIN SURGERY     Nonessential tremors   CATARACT EXTRACTION W/PHACO Left 10/22/2017   Procedure: CATARACT EXTRACTION PHACO AND INTRAOCULAR LENS PLACEMENT (IOC)  LEFT;  Surgeon: Myrna Adine Anes, MD;  Location: [facility];  Service: Ophthalmology;  Laterality: Left;   CATARACT EXTRACTION W/PHACO Right 11/20/2017   Procedure: CATARACT EXTRACTION PHACO AND INTRAOCULAR LENS PLACEMENT (IOC) RIGHT;  Surgeon: Myrna Adine Anes, MD;  Location: [facility];  Service: Ophthalmology;  Laterality: Right;   ESOPHAGOGASTRODUODENOSCOPY (EGD) WITH PROPOFOL  N/A 10/31/2016   Procedure: ESOPHAGOGASTRODUODENOSCOPY (EGD) WITH PROPOFOL ;  Surgeon: Gaylyn Gladis PENNER, MD;  Location: [facility] END[facility];  Service: Endoscopy;  Laterality: N/A;   I & D EXTREMITY Right 11/29/2021   Procedure: IRRIGATION AND DEBRIDEMENT EXTREMITY;  Surgeon: Desiderio Schanz, MD;  Location: [facility] ORS;  Service: General;  Laterality: Right;   JOINT REPLACEMENT Right 2003   knee   knee  meniscus repair Right    LEG WOUND REPAIR / CLOSURE  11/2021   MELANOMA EXCISION Right    ear. Followed by Dr. Hester   PATELLECTOMY Bilateral    TONSILLECTOMY       Allergies[1]   Outpatient Encounter Medications as of 02/29/2024  Medication Sig   Acetaminophen  500 MG capsule Take 2 capsules (1,000 mg total) by mouth every 12 (twelve) hours.   ALPRAZolam  (XANAX ) 1 MG tablet Take 1 tablet (1 mg total) by mouth 2 (two) times daily. MAY give additional dose  0.5 MG daily  IF NEEDED FOR AGITATION   ascorbic acid  (VITAMIN C ) 500 MG tablet Take 500 mg by mouth daily.   brexpiprazole (  REXULTI) 1 MG TABS tablet Take 0.5 tablets (0.5 mg total) by mouth daily for 7 days, THEN 1 tablet (1 mg total) daily.   busPIRone  (BUSPAR ) 10 MG tablet Take 1 tablet (10 mg total) by mouth 3 (three) times daily.   Cholecalciferol  (VITAMIN D3) 125 MCG (5000 UT) TABS Take 1 tablet by mouth daily.   ezetimibe  (ZETIA ) 10 MG tablet TAKE 1 TABLET BY MOUTH EVERY DAY   famotidine (PEPCID) 20 MG tablet Take 20 mg by mouth 2 (two) times daily.   fluticasone  (FLONASE ) 50 MCG/ACT nasal spray Place 1 spray into both nostrils daily.   Folic Acid  (FOLATE PO) Take 680 mcg by mouth daily.   levothyroxine  (SYNTHROID ) 100 MCG tablet TAKE 1 TABLET BY MOUTH EVERY DAY BEFORE BREAKFAST   Polyethyl Glycol-Propyl Glycol (SYSTANE) 0.4-0.3 % SOLN Apply to eye.   polyethylene glycol (MIRALAX  / GLYCOLAX ) 17 g packet Take 17 g by mouth daily.   Probiotic Product (MISC INTESTINAL FLORA REGULAT) CAPS Take by mouth.   QUEtiapine  (SEROQUEL ) 50 MG tablet Take 1 tablet (50 mg total) by mouth at bedtime.   senna (SENOKOT) 8.6 MG TABS tablet Take 1 tablet by mouth at bedtime as needed for mild constipation.   sertraline  (ZOLOFT ) 100 MG tablet Take 1.5 tablets (150 mg total) by mouth daily.   vitamin B-12 (CYANOCOBALAMIN ) 500 MCG tablet Take 1 tablet by mouth daily.   cycloSPORINE  (RESTASIS ) 0.05 % ophthalmic emulsion Place 1 drop into both eyes 2  (two) times daily. (Patient not taking: Reported on 02/29/2024)   No facility-administered encounter medications on file as of 02/29/2024.     Review of Systems  Unable to perform ROS: Dementia     Immunization History  Administered Date(s) Administered   Fluad Quad(high Dose 65+) 12/13/2018, 12/21/2020   INFLUENZA, HIGH DOSE SEASONAL PF 01/04/2023   Influenza,inj,Quad PF,6+ Mos 01/14/2014, 01/25/2018   Influenza-Unspecified 12/11/2012, 12/26/2019, 12/28/2021   Moderna Covid-19 Fall Seasonal Vaccine 17yrs & older 12/08/2022, 06/22/2023   Moderna Sars-Covid-2 Vaccination 03/28/2019, 04/25/2019, 05/16/2019, 06/26/2019, 01/17/2020, 07/29/2020   Pfizer Covid-19 Vaccine Bivalent Booster 35yrs & up 12/02/2020   Pfizer(Comirnaty)Fall Seasonal Vaccine 12 years and older 06/22/2023   Pneumococcal Conjugate-13 01/14/2014, 10/04/2018, 12/13/2018   Pneumococcal Polysaccharide-23 04/28/2012   Td 12/02/2021   Tdap 08/19/2014   Unspecified SARS-COV-2 Vaccination 01/04/2024   Zoster Recombinant(Shingrix) 03/12/2017, 09/19/2021   Zoster, Live 04/28/2010   Pertinent  Health Maintenance Due  Topic Date Due   Influenza Vaccine  06/10/2024 (Originally 10/12/2023)      07/18/2023   11:40 AM 10/01/2023   10:19 AM 10/17/2023   11:12 AM 01/14/2024    1:04 PM 02/13/2024    2:55 PM  Fall Risk  Falls in the past year? 1 1 1 1 1   Was there an injury with Fall? 0  0  0  0  1  Fall Risk Category Calculator 2 2 2 2 3   Patient at Risk for Falls Due to History of fall(s) History of fall(s);Impaired balance/gait History of fall(s) History of fall(s) History of fall(s)  Fall risk Follow up Falls evaluation completed Falls evaluation completed;Falls prevention discussed Falls evaluation completed Falls evaluation completed Falls evaluation completed     Data saved with a previous flowsheet row definition   Functional Status Survey:     Vitals:   02/29/24 0836  BP: (!) 176/71  Pulse: 60  Resp: 20  Temp: 98.2  F (36.8 C)  SpO2: 100%  Weight: 161 lb (73 kg)  Height: 6'  3 (1.905 m)   Body mass index is 20.12 kg/m. Physical Exam Vitals and nursing note reviewed.  Constitutional:      General: He is not in acute distress.    Comments: Awake, thin, not in any distress.  Very shaky when standing up.  He required 3 attempts to stand up from his bed to walk behind his wheelchair.  HENT:     Head: Normocephalic and atraumatic.  Cardiovascular:     Rate and Rhythm: Normal rate and regular rhythm.  Pulmonary:     Effort: Pulmonary effort is normal. No respiratory distress.  Abdominal:     General: Abdomen is flat. There is no distension.  Musculoskeletal:     Right lower leg: No edema.     Left lower leg: No edema.  Skin:    General: Skin is warm and dry.     Capillary Refill: Capillary refill takes less than 2 seconds.  Neurological:     Gait: Gait abnormal.     Comments: Walking behind wheelchair. Required 3 attempts to stand from sitting position on his bed.      Labs reviewed: Recent Labs    05/31/23 1537  NA 140  K 5.0  CL 104  CO2 29  GLUCOSE 72  BUN 35*  CREATININE 1.63*  CALCIUM  9.0   Recent Labs    05/31/23 1537  AST 20  ALT 17  ALKPHOS 48  BILITOT 0.8  PROT 6.6  ALBUMIN  4.3   Recent Labs    05/31/23 1537  WBC 4.3  NEUTROABS 2.5  HGB 13.5  HCT 39.6  MCV 96.3  PLT 229.0   Lab Results  Component Value Date   TSH 0.40 05/31/2023   Lab Results  Component Value Date   HGBA1C 5.0 01/04/2023   Lab Results  Component Value Date   CHOL 152 01/04/2023   HDL 65.20 01/04/2023   LDLCALC 71 01/04/2023   LDLDIRECT 72.0 01/04/2023   TRIG 78.0 01/04/2023   CHOLHDL 2 01/04/2023     Assessment and Plan: Moderate Alzheimer dementia with agitation (HCC) Patient currently on Seroquel  50 mg at bedtime.  Will start Rexulti 0.5 mg every morning for a week then increase to 1 mg every morning.  Will monitor to see how his agitation is.  May have to stop Seroquel   if pt gets too sedated.  Since we are taking over his care at memory care as his primary provider, we have not had the opportunity to perform any gradual dose reductions as this is the first time we are assuming care as his primary care physician.  Adult failure to thrive syndrome Patient has failure to thrive syndrome.  Likely due to his advanced age and poor appetite.  Chronic obstructive pulmonary disease (HCC) Stable COPD.  He is not requiring any inhalers.  Chronic kidney disease, stage 3b (HCC) Last BUN/creatinine in August 2025 BUN of 32, creatinine 1.6  GAD (generalized anxiety disorder) Patient has anxiety along with dementia.  He is already on sertraline  150 mg daily.,  BuSpar  10 mg 3 times a day, Xanax  1 mg every 12 hours scheduled and 0.5 mg daily as needed increased anxiety.  Hopefully the Rexulti that is being started will also help with his anxiety.  Dyslipidemia Continue him on Zetia  10 mg daily.  DNR (do not resuscitate) discussion Patient's DNR sheet is completed and the DNR binder at the nurses station.  GERD without esophagitis Continue Pepcid 20 mg twice daily  Acquired hypothyroidism Continue Synthroid   100 mcg daily    Meds ordered this encounter  Medications   brexpiprazole (REXULTI) 1 MG TABS tablet    Sig: Take 0.5 tablets (0.5 mg total) by mouth daily for 7 days, THEN 1 tablet (1 mg total) daily.     Camellia Door, DO  [facility] [phone]       [1]  Allergies Allergen Reactions   Penicillins Rash and Other (See Comments)    Other reaction(s): Other (see comments) Other reaction(s): UNKNOWN   Benzonatate      Pruritic rash   Lexapro  [Escitalopram ] Other (See Comments)    Increased hand tremor   Molnupiravir      Pruritic rash on neck

## 2024-02-29 NOTE — Assessment & Plan Note (Signed)
 Continue him on Zetia  10 mg daily.

## 2024-03-03 ENCOUNTER — Telehealth: Payer: Self-pay | Admitting: Internal Medicine

## 2024-03-03 NOTE — Telephone Encounter (Signed)
 Spoke with pt's wife [name] via phone.  [phone].  Discussed that I am assuming pt's care at her request. Discussed pt's severe dementia with agitation and paranoia.  Discussed pt's increasing agitation and violent behavior towards staff. Discussed that if pt's aggressive, agitated and violent behavior causes any staff member to be significantly injured, then pt may not be able to stay in memory care unit any longer.  Wife agrees with above statements and wishes for medications to be used to help calm and control pt's agitation. Reviewed that pt was already on Seroquel  at night time. Discussed adding Rexulti  to regiment. Discussed risks and benefits of Rexulti  in a patient who is 89 yo with dementia and agitation/behavioral disturbance.  Family members were informed of the potential risks and benefits of starting/increasing the dose of psychotropic medications, in accordance with CMS guidelines for informed consent.   The discussion included the following key points: Purpose of Medication: Explanation of the specific psychiatric condition being treated (e.g., depression, anxiety, agitation, end of life care) and how the medication is expected to improve the patient's symptoms. Risks: A detailed review of the potential side effects of the medication(s), including but not limited to sedation, cognitive decline, falls, weight gain, or changes in behavior. The risks of overmedication or inappropriate dosing, as well as the potential for drug interactions, were discussed. Benefits: The potential benefits of the medication(s) were explained, including symptom relief, improved quality of life, and possible reduction in distress or behavioral problems. It was emphasized that benefits may vary and are not guaranteed. Alternative Approaches: Other non-pharmacological treatments or alternative medications, if applicable, were reviewed, and the patient/family was given the opportunity to ask questions and express  preferences. Monitoring Plan: The need for close monitoring of medication effects, including follow-up appointments, lab tests, and behavioral assessments, was outlined. Family members were made aware of how the patient's progress will be tracked. Voluntary Participation: It was emphasized that the decision to start or increase the medication is voluntary and the family has the right to withdraw consent at any time without penalty. The family was encouraged to ask questions and seek further clarification as needed.   The wife(Dane) acknowledged understanding the risks, benefits, and alternatives discussed and gave verbal consent to proceed with the treatment plan. Written consent will be obtained by facility administrative staff.   Camellia Door, DO [facility].

## 2024-03-11 ENCOUNTER — Ambulatory Visit: Admitting: Internal Medicine

## 2024-03-12 ENCOUNTER — Other Ambulatory Visit: Payer: Self-pay | Admitting: Orthopedic Surgery

## 2024-03-12 DIAGNOSIS — F03911 Unspecified dementia, unspecified severity, with agitation: Secondary | ICD-10-CM

## 2024-03-12 MED ORDER — LORAZEPAM 0.5 MG PO TABS: 0.5000 mg | ORAL_TABLET | Freq: Four times a day (QID) | ORAL | 1 refills | Status: DC | PRN

## 2024-03-16 ENCOUNTER — Emergency Department

## 2024-03-16 ENCOUNTER — Observation Stay

## 2024-03-16 ENCOUNTER — Other Ambulatory Visit: Payer: Self-pay

## 2024-03-16 ENCOUNTER — Inpatient Hospital Stay
Admission: EM | Admit: 2024-03-16 | Discharge: 2024-03-20 | DRG: 193 | Disposition: A | Discharge status: Hospice | Attending: Obstetrics and Gynecology | Admitting: Obstetrics and Gynecology

## 2024-03-16 DIAGNOSIS — S92911A Unspecified fracture of right toe(s), initial encounter for closed fracture: Principal | ICD-10-CM

## 2024-03-16 DIAGNOSIS — Z96651 Presence of right artificial knee joint: Secondary | ICD-10-CM | POA: Diagnosis present

## 2024-03-16 DIAGNOSIS — E860 Dehydration: Secondary | ICD-10-CM | POA: Diagnosis present

## 2024-03-16 DIAGNOSIS — Z5320 Procedure and treatment not carried out because of patient's decision for unspecified reasons: Secondary | ICD-10-CM | POA: Diagnosis not present

## 2024-03-16 DIAGNOSIS — K219 Gastro-esophageal reflux disease without esophagitis: Secondary | ICD-10-CM | POA: Diagnosis present

## 2024-03-16 DIAGNOSIS — R456 Violent behavior: Secondary | ICD-10-CM | POA: Diagnosis present

## 2024-03-16 DIAGNOSIS — J189 Pneumonia, unspecified organism: Principal | ICD-10-CM | POA: Diagnosis present

## 2024-03-16 DIAGNOSIS — Z961 Presence of intraocular lens: Secondary | ICD-10-CM | POA: Diagnosis present

## 2024-03-16 DIAGNOSIS — F05 Delirium due to known physiological condition: Secondary | ICD-10-CM | POA: Diagnosis present

## 2024-03-16 DIAGNOSIS — Z515 Encounter for palliative care: Secondary | ICD-10-CM

## 2024-03-16 DIAGNOSIS — Z8249 Family history of ischemic heart disease and other diseases of the circulatory system: Secondary | ICD-10-CM

## 2024-03-16 DIAGNOSIS — S92501A Displaced unspecified fracture of right lesser toe(s), initial encounter for closed fracture: Secondary | ICD-10-CM | POA: Diagnosis present

## 2024-03-16 DIAGNOSIS — Z79890 Hormone replacement therapy (postmenopausal): Secondary | ICD-10-CM

## 2024-03-16 DIAGNOSIS — Z880 Allergy status to penicillin: Secondary | ICD-10-CM

## 2024-03-16 DIAGNOSIS — N1832 Chronic kidney disease, stage 3b: Secondary | ICD-10-CM | POA: Diagnosis present

## 2024-03-16 DIAGNOSIS — F03918 Unspecified dementia, unspecified severity, with other behavioral disturbance: Secondary | ICD-10-CM

## 2024-03-16 DIAGNOSIS — E785 Hyperlipidemia, unspecified: Secondary | ICD-10-CM | POA: Diagnosis present

## 2024-03-16 DIAGNOSIS — J449 Chronic obstructive pulmonary disease, unspecified: Secondary | ICD-10-CM | POA: Diagnosis present

## 2024-03-16 DIAGNOSIS — R296 Repeated falls: Secondary | ICD-10-CM | POA: Diagnosis present

## 2024-03-16 DIAGNOSIS — F0390 Unspecified dementia without behavioral disturbance: Secondary | ICD-10-CM

## 2024-03-16 DIAGNOSIS — E43 Unspecified severe protein-calorie malnutrition: Secondary | ICD-10-CM | POA: Diagnosis present

## 2024-03-16 DIAGNOSIS — G309 Alzheimer's disease, unspecified: Secondary | ICD-10-CM | POA: Diagnosis present

## 2024-03-16 DIAGNOSIS — Z79899 Other long term (current) drug therapy: Secondary | ICD-10-CM

## 2024-03-16 DIAGNOSIS — I2510 Atherosclerotic heart disease of native coronary artery without angina pectoris: Secondary | ICD-10-CM | POA: Diagnosis present

## 2024-03-16 DIAGNOSIS — Z808 Family history of malignant neoplasm of other organs or systems: Secondary | ICD-10-CM

## 2024-03-16 DIAGNOSIS — E039 Hypothyroidism, unspecified: Secondary | ICD-10-CM | POA: Diagnosis present

## 2024-03-16 DIAGNOSIS — Z888 Allergy status to other drugs, medicaments and biological substances status: Secondary | ICD-10-CM

## 2024-03-16 DIAGNOSIS — Z85820 Personal history of malignant melanoma of skin: Secondary | ICD-10-CM

## 2024-03-16 DIAGNOSIS — I739 Peripheral vascular disease, unspecified: Secondary | ICD-10-CM | POA: Diagnosis present

## 2024-03-16 DIAGNOSIS — J188 Other pneumonia, unspecified organism: Secondary | ICD-10-CM

## 2024-03-16 DIAGNOSIS — Z974 Presence of external hearing-aid: Secondary | ICD-10-CM

## 2024-03-16 DIAGNOSIS — R7989 Other specified abnormal findings of blood chemistry: Secondary | ICD-10-CM | POA: Diagnosis present

## 2024-03-16 DIAGNOSIS — F02C11 Dementia in other diseases classified elsewhere, severe, with agitation: Secondary | ICD-10-CM | POA: Diagnosis present

## 2024-03-16 DIAGNOSIS — F411 Generalized anxiety disorder: Secondary | ICD-10-CM | POA: Diagnosis present

## 2024-03-16 DIAGNOSIS — Z681 Body mass index (BMI) 19 or less, adult: Secondary | ICD-10-CM

## 2024-03-16 DIAGNOSIS — F02B11 Dementia in other diseases classified elsewhere, moderate, with agitation: Secondary | ICD-10-CM | POA: Diagnosis present

## 2024-03-16 DIAGNOSIS — Z8546 Personal history of malignant neoplasm of prostate: Secondary | ICD-10-CM

## 2024-03-16 DIAGNOSIS — I129 Hypertensive chronic kidney disease with stage 1 through stage 4 chronic kidney disease, or unspecified chronic kidney disease: Secondary | ICD-10-CM | POA: Diagnosis present

## 2024-03-16 DIAGNOSIS — Z1152 Encounter for screening for COVID-19: Secondary | ICD-10-CM

## 2024-03-16 DIAGNOSIS — X58XXXA Exposure to other specified factors, initial encounter: Secondary | ICD-10-CM | POA: Diagnosis present

## 2024-03-16 DIAGNOSIS — C61 Malignant neoplasm of prostate: Secondary | ICD-10-CM | POA: Diagnosis present

## 2024-03-16 DIAGNOSIS — J440 Chronic obstructive pulmonary disease with acute lower respiratory infection: Secondary | ICD-10-CM | POA: Diagnosis present

## 2024-03-16 DIAGNOSIS — Z8616 Personal history of COVID-19: Secondary | ICD-10-CM

## 2024-03-16 DIAGNOSIS — Z66 Do not resuscitate: Secondary | ICD-10-CM | POA: Diagnosis present

## 2024-03-16 LAB — URINALYSIS, ROUTINE W REFLEX MICROSCOPIC
Bilirubin Urine: NEGATIVE
Glucose, UA: NEGATIVE mg/dL
Hgb urine dipstick: NEGATIVE
Ketones, ur: NEGATIVE mg/dL
Leukocytes,Ua: NEGATIVE
Nitrite: NEGATIVE
Protein, ur: NEGATIVE mg/dL
Specific Gravity, Urine: 1.012 (ref 1.005–1.030)
pH: 6 (ref 5.0–8.0)

## 2024-03-16 LAB — BASIC METABOLIC PANEL WITH GFR
Anion gap: 12 (ref 5–15)
BUN: 26 mg/dL — ABNORMAL HIGH (ref 8–23)
CO2: 27 mmol/L (ref 22–32)
Calcium: 9.2 mg/dL (ref 8.9–10.3)
Chloride: 103 mmol/L (ref 98–111)
Creatinine, Ser: 1.31 mg/dL — ABNORMAL HIGH (ref 0.61–1.24)
GFR, Estimated: 51 mL/min — ABNORMAL LOW
Glucose, Bld: 121 mg/dL — ABNORMAL HIGH (ref 70–99)
Potassium: 5 mmol/L (ref 3.5–5.1)
Sodium: 141 mmol/L (ref 135–145)

## 2024-03-16 LAB — CBC
HCT: 35.6 % — ABNORMAL LOW (ref 39.0–52.0)
Hemoglobin: 11.7 g/dL — ABNORMAL LOW (ref 13.0–17.0)
MCH: 31.3 pg (ref 26.0–34.0)
MCHC: 32.9 g/dL (ref 30.0–36.0)
MCV: 95.2 fL (ref 80.0–100.0)
Platelets: 285 K/uL (ref 150–400)
RBC: 3.74 MIL/uL — ABNORMAL LOW (ref 4.22–5.81)
RDW: 13.7 % (ref 11.5–15.5)
WBC: 8 K/uL (ref 4.0–10.5)
nRBC: 0 % (ref 0.0–0.2)

## 2024-03-16 LAB — TROPONIN T, HIGH SENSITIVITY
Troponin T High Sensitivity: 105 ng/L (ref 0–19)
Troponin T High Sensitivity: 100 ng/L (ref 0–19)

## 2024-03-16 LAB — RESP PANEL BY RT-PCR (RSV, FLU A&B, COVID)  RVPGX2
Influenza A by PCR: NEGATIVE
Influenza B by PCR: NEGATIVE
Resp Syncytial Virus by PCR: NEGATIVE
SARS Coronavirus 2 by RT PCR: NEGATIVE

## 2024-03-16 LAB — LACTIC ACID, PLASMA
Lactic Acid, Venous: 1.1 mmol/L (ref 0.5–1.9)
Lactic Acid, Venous: 1.3 mmol/L (ref 0.5–1.9)

## 2024-03-16 LAB — PROCALCITONIN: Procalcitonin: 0.84 ng/mL

## 2024-03-16 LAB — MAGNESIUM: Magnesium: 2.1 mg/dL (ref 1.7–2.4)

## 2024-03-16 MED ORDER — HEPARIN SODIUM (PORCINE) 5000 UNIT/ML IJ SOLN
5000.0000 [IU] | Freq: Three times a day (TID) | INTRAMUSCULAR | Status: DC
  Administered 2024-03-16 – 2024-03-17 (×3): 5000 [IU] via SUBCUTANEOUS
  Filled 2024-03-16 (×3): qty 1

## 2024-03-16 MED ORDER — FAMOTIDINE 20 MG PO TABS
20.0000 mg | ORAL_TABLET | Freq: Two times a day (BID) | ORAL | Status: DC
  Filled 2024-03-16 (×2): qty 1

## 2024-03-16 MED ORDER — FOLIC ACID 1 MG PO TABS
1.0000 mg | ORAL_TABLET | Freq: Every day | ORAL | Status: DC
  Filled 2024-03-16: qty 1

## 2024-03-16 MED ORDER — SODIUM CHLORIDE 0.9% FLUSH
3.0000 mL | Freq: Two times a day (BID) | INTRAVENOUS | Status: DC
  Administered 2024-03-16 – 2024-03-20 (×9): 3 mL via INTRAVENOUS

## 2024-03-16 MED ORDER — SENNOSIDES-DOCUSATE SODIUM 8.6-50 MG PO TABS: 1.0000 | ORAL_TABLET | Freq: Every evening | ORAL | Status: DC | PRN

## 2024-03-16 MED ORDER — SODIUM CHLORIDE 0.9 % IV SOLN
2.0000 g | Freq: Once | INTRAVENOUS | Status: AC
  Administered 2024-03-16: 2 g via INTRAVENOUS
  Filled 2024-03-16: qty 20

## 2024-03-16 MED ORDER — ACETAMINOPHEN 325 MG RE SUPP: 650.0000 mg | Freq: Four times a day (QID) | RECTAL | Status: DC | PRN

## 2024-03-16 MED ORDER — SODIUM CHLORIDE 0.9 % IV SOLN
500.0000 mg | Freq: Once | INTRAVENOUS | Status: AC
  Administered 2024-03-16: 500 mg via INTRAVENOUS
  Filled 2024-03-16: qty 5

## 2024-03-16 MED ORDER — ONDANSETRON HCL 4 MG/2ML IJ SOLN: 4.0000 mg | Freq: Four times a day (QID) | INTRAMUSCULAR | Status: DC | PRN

## 2024-03-16 MED ORDER — BREXPIPRAZOLE 1 MG PO TABS
2.0000 mg | ORAL_TABLET | Freq: Every day | ORAL | Status: DC
  Administered 2024-03-16: 2 mg via ORAL
  Filled 2024-03-16: qty 2
  Filled 2024-03-16: qty 1

## 2024-03-16 MED ORDER — SODIUM CHLORIDE 0.9 % IV BOLUS
1000.0000 mL | Freq: Once | INTRAVENOUS | Status: AC
  Administered 2024-03-16: 1000 mL via INTRAVENOUS

## 2024-03-16 MED ORDER — LORAZEPAM 2 MG/ML IJ SOLN
1.0000 mg | Freq: Once | INTRAMUSCULAR | Status: AC | PRN
  Administered 2024-03-16: 1 mg via INTRAVENOUS
  Filled 2024-03-16: qty 1

## 2024-03-16 MED ORDER — ZIPRASIDONE HCL 20 MG PO CAPS
20.0000 mg | ORAL_CAPSULE | Freq: Two times a day (BID) | ORAL | Status: DC
  Administered 2024-03-16: 20 mg via ORAL
  Filled 2024-03-16 (×4): qty 1

## 2024-03-16 MED ORDER — VITAMIN D3 25 MCG (1000 UNIT) PO TABS
5000.0000 [IU] | ORAL_TABLET | Freq: Every day | ORAL | Status: DC
  Filled 2024-03-16 (×2): qty 5

## 2024-03-16 MED ORDER — SODIUM CHLORIDE 0.9 % IV SOLN: 500.0000 mg | INTRAVENOUS | Status: DC

## 2024-03-16 MED ORDER — LEVOTHYROXINE SODIUM 50 MCG PO TABS: 100.0000 ug | ORAL_TABLET | Freq: Every day | ORAL | Status: DC

## 2024-03-16 MED ORDER — EZETIMIBE 10 MG PO TABS
10.0000 mg | ORAL_TABLET | Freq: Every day | ORAL | Status: DC
  Filled 2024-03-16: qty 1

## 2024-03-16 MED ORDER — SODIUM CHLORIDE 0.9 % IV SOLN: 2.0000 g | INTRAVENOUS | Status: DC

## 2024-03-16 MED ORDER — ACETAMINOPHEN 325 MG PO TABS: 650.0000 mg | ORAL_TABLET | Freq: Four times a day (QID) | ORAL | Status: DC | PRN

## 2024-03-16 MED ORDER — OLANZAPINE 10 MG IM SOLR
2.5000 mg | Freq: Once | INTRAMUSCULAR | Status: AC | PRN
  Administered 2024-03-16: 2.5 mg via INTRAMUSCULAR
  Filled 2024-03-16: qty 10

## 2024-03-16 MED ORDER — ASPIRIN 81 MG PO CHEW
324.0000 mg | CHEWABLE_TABLET | Freq: Once | ORAL | Status: AC
  Administered 2024-03-16: 324 mg via ORAL
  Filled 2024-03-16: qty 4

## 2024-03-16 MED ORDER — ONDANSETRON HCL 4 MG PO TABS: 4.0000 mg | ORAL_TABLET | Freq: Four times a day (QID) | ORAL | Status: DC | PRN

## 2024-03-16 MED ORDER — ZIPRASIDONE MESYLATE 20 MG IM SOLR
20.0000 mg | Freq: Once | INTRAMUSCULAR | Status: AC
  Administered 2024-03-16: 20 mg via INTRAMUSCULAR
  Filled 2024-03-16: qty 20

## 2024-03-16 MED ORDER — ALPRAZOLAM 0.5 MG PO TABS
1.0000 mg | ORAL_TABLET | Freq: Once | ORAL | Status: AC
  Administered 2024-03-16: 1 mg via ORAL
  Filled 2024-03-16: qty 2

## 2024-03-16 NOTE — ED Notes (Signed)
 Pt sitting up in bed eating dinner.

## 2024-03-16 NOTE — ED Notes (Signed)
 Pt wife [name] called back for update, informed of admit status

## 2024-03-16 NOTE — ED Notes (Addendum)
 Pt combative and uncooperative with this RN taking vitals. PT would not allow his temp to be taken and RN could not get a accurate BP as pt would not keep his arm still. Pt will not keep cardiac leads on.

## 2024-03-16 NOTE — ED Notes (Signed)
 Pt stating need to void, pt very irate that this RN has not helped him. Pt informed this is the first time he has mentioned needing to void. Urinal used to help pt void. Pt cleaned of urine, repostioned.

## 2024-03-16 NOTE — ED Notes (Signed)
"  Pt refusing all oral medications   "

## 2024-03-16 NOTE — ED Notes (Signed)
 Pt seen attempting to slide out of bed, pt no longer following instructions. Pt becoming aggressive with staff not allowing anyone to help him slide up in bed. Pt adament about going home and that his wife has been waiting for him today.  This RN called wife hoping she could speak with him and calm him down, this did not work. Pt continues to stay at edge of bed and yell at staff. IM meds to be given.

## 2024-03-16 NOTE — BH Assessment (Signed)
 Comprehensive Clinical Assessment (CCA) Screening, Triage and Referral Note  03/16/2024 Theodore [name] [id number]  Chief Complaint:  Chief Complaint  Patient presents with   Agitation   Visit Diagnosis:   Theodore [name] is a 89 year old male who presents to the ER, due to changes in his behaviors within the last two weeks. Patient was able to provide his name and date of birth but was unable to provide accurate date, year and location. When asked what brought him to the ER, he shared, that he had disappointed his wife and he is on his last straw. 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 During the interview, the patient was tearful.  Collateral was obtained from [facility] staff [phone]), who report a significant behavioral and cognitive decline over the past two weeks. Although the patient previously presented as irritable and sarcastic at baseline, he is now increasingly combative, verbally aggressive, and threatening toward staff and residents, including statements about harming others. Staff also report, the other day he attempted to touch staff after contact with stool. They were trying to help him clean up from when he was using the bathroom. He has tried to roll over residents and staff his wheelchair, and is considered a high fall risk.  The patient is refusing medications and food, has had minimal sleep, and has been wandering into other residents' rooms overnight. Staff have also observed coughing with meals, raising concerns for medical concerns. Several psychiatric medications have been trialed or adjusted with limited benefit, and staff are concerned about a possible underlying medical cause. His current presentation represents a marked decline from baseline, within the last two weeks.  Lab work wasn't completed and result before TTS completed this consult.  Patient Reported Information How did you  hear about us ? Other (Comment)  What Is the Reason for Your Visit/Call Today? Changes in his behaviors within the last two weeks  How Long Has This Been Causing You Problems? 1 wk - 1 month  What Do You Feel Would Help You the Most Today? Treatment for Depression or other mood problem   Have You Recently Had Any Thoughts About Hurting Yourself? No  Are You Planning to Commit Suicide/Harm Yourself At This time? No   Have you Recently Had Thoughts About Hurting Someone Theodore [name]? No  Are You Planning to Harm Someone at This Time? No  Explanation: No data recorded  Have You Used Any Alcohol  or Drugs in the Past 24 Hours? No  How Long Ago Did You Use Drugs or Alcohol ? No data recorded What Did You Use and How Much? No data recorded  Do You Currently Have a Therapist/Psychiatrist? Yes  Name of Therapist/Psychiatrist: Via the care facility   Have You Been Recently Discharged From Any Office Practice or Programs? No  Explanation of Discharge From Practice/Program: No data recorded   CCA Screening Triage Referral Assessment Type of Contact: Face-to-Face  Telemedicine Service Delivery:   Is this Initial or Reassessment?   Date Telepsych consult ordered in [facility]:    Time Telepsych consult ordered in [facility]:    Location of Assessment: [facility] ED  Provider Location: [facility]    Collateral Involvement: No data recorded  Does Patient Have a Court Appointed Legal Guardian? No data recorded Name and Contact of Legal Guardian: No data recorded If Minor and Not Living with Parent(s), Who has Custody? No data recorded Is CPS involved or ever been  involved? Never  Is APS involved or ever been involved? Never   Patient Determined To Be At Risk for Harm To Self or Others Based on Review of Patient Reported Information or Presenting Complaint? No  Method: No data recorded Availability of Means: No data recorded Intent: No data recorded Notification Required: No data recorded Additional  Information for Danger to Others Potential: No data recorded Additional Comments for Danger to Others Potential: No data recorded Are There Guns or Other Weapons in Your Home? No  Types of Guns/Weapons: No data recorded Are These Weapons Safely Secured?                            No data recorded Who Could Verify You Are Able To Have These Secured: No data recorded Do You Have any Outstanding Charges, Pending Court Dates, Parole/Probation? No data recorded Contacted To Inform of Risk of Harm To Self or Others: No data recorded  Does Patient Present under Involuntary Commitment? No   [place] of Residence: [place]   Patient Currently Receiving the Following Services: -- (Memory Care Unit)   Determination of Need: Emergent (2 hours)   Options For Referral: No data recorded  Disposition Recommendation per psychiatric provider: Pending Psych Consult and Lab work  Kiki DOROTHA Barge MS, LCAS, [facility], [facility] Therapeutic Triage Specialist 03/16/2024 1:25 PM

## 2024-03-16 NOTE — ED Notes (Signed)
 Pt currently laying in bed, covered with blankets. NAD at this time, will continue to monitor.

## 2024-03-16 NOTE — ED Notes (Addendum)
 Pt still very agitated. Will give the meds a little longer and try to obtain VS again.

## 2024-03-16 NOTE — ED Notes (Signed)
 This RN and Scientist, Product/process Development, assisted pt with the urinal as pt said he needed to void. Pt did not void. Pt pulled back up in the bed with bed alarm on.

## 2024-03-16 NOTE — ED Notes (Signed)
 Pt returned from CT. CT stating pt became agitated and started kicking at staff. Upon arrival to room pt attempting to crawl out of bed. This RN able to talk pt back into laying in bed, covered with blankets, bed alarm still in place.

## 2024-03-16 NOTE — ED Provider Notes (Signed)
 "  [facility] Provider Note    Event Date/Time   First MD Initiated Contact with Patient 03/16/24 1151     (approximate)   History   Agitation  EM caveat dementia  Reviewed records from his care facility which reports that he became combative and agitated  HPI  Theodore [name] is a 89 y.o. male he reports that he must be here because he wants to got into an argument with a store owner  He has not any pain or discomfort.  Does not want to harm anyone or himself.  He just reports that he just must of somehow ended up got in an argument and that negotiation did not go well  Denies any pain.  Right foot is not causing him any pain or discomfort.  He does point out that he wears size 15 shoes [for which I suspect he is quite right]  He has no complaint at this point reports he is not sure why he got sent over here because the store owner must of gotten an argument with him  Reviewed external records from his primary physician team.  Carries history of dementia.  Also has a history of psychotic disturbances and mood disturbances.  Frequent falls.  Past Medical History:  Diagnosis Date   Actinic keratosis    Allergy 1990s   Penicillin   Anxiety 1946   Arthritis    knees   Asymptomatic Sinus bradycardia    a. 05/2016 Zio: Avg HR 61 (41-167).   Benign essential tremor    Cancer (HCC) 2002   melanoma right ear   Chronic kidney disease    Mild   Colon polyp    COPD (chronic obstructive pulmonary disease) (HCC)    pt said he believes it is a misdiagnosis    Coronary artery calcification seen on CT scan    a. 06/2016 CTA Chest: cor Ca2+; b. 05/2017 MV: EF >65%. No ischemia/infarct.   COVID-19 virus infection 04/21/20   Depression 1946   Death of brother   Dysplastic nevus 04/25/2022   Left lateral neck, severe, shave removal 06/05/22   GERD (gastroesophageal reflux disease)    History of echocardiogram    a. 04/2016 Echo: EF 60-65%, mild conc LVH. Nl  PASP.   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 05/2016 Zio: Avg HR 61 (41-167). 9 SVT runs (fastest 167 - 5 beats; longest 8 beats - 101 bpm). Rare PACs/PVCs.   Pre-syncope    a. 06/2022 Zio: Predominantly sinus bradycardia with an average heart rate of 53 (40-148).  1 run of NSVT x 17 beats (143 bpm), 19 SVT runs (fastest 148 x 7 beats; longest 11 beats @ 114 bpm).  Rare PVCs/PACs. Triggered events assoc w/ sinus rhythym.   PSVT (paroxysmal supraventricular tachycardia)    a. 05/2016 Zio: 9 SVT runs; b. 06/2022 Zio: 19 SVT runs, fastest 148, longest 11 beats.   PVC's (premature ventricular contractions)    Rhinitis    Sepsis due to cellulitis (HCC) 11/28/2021   Severe sepsis (HCC)  11/28/2021   Wears dentures    partial upper and lower   Wears hearing aid in both ears      Physical Exam   Triage Vital Signs: ED Triage Vitals  Encounter Vitals Group     BP 03/16/24 1140 (!) 158/72     Girls Systolic BP Percentile --      Girls Diastolic BP Percentile --      Boys Systolic BP Percentile --      Boys Diastolic BP Percentile --      Pulse Rate 03/16/24 1140 65     Resp 03/16/24 1140 14     Temp 03/16/24 1140 97.6 F (36.4 C)     Temp Source 03/16/24 1140 Oral     SpO2 03/16/24 1140 94 %     Weight 03/16/24 1144 160 lb 15 oz (73 kg)     Height 03/16/24 1144 6' 4 (1.93 m)     Head Circumference --      Peak Flow --      Pain Score 03/16/24 1140 3     Pain Loc --      Pain Education --      Exclude from Growth Chart --     Most recent vital signs: Vitals:   03/16/24 1430 03/16/24 1500  BP: (!) 181/74 (!) 174/68  Pulse:    Resp: (!) 24  (!) 22  Temp:    SpO2:  100%     General: Awake, no distress.  He is pleasant recognizes himself but he is disoriented to date and situation.  Shows no acute psychomotor agitation at this time is calm and pleasant with our care request allow his IV to be placed.  This conversation though is obviously that of the man who is not well oriented CV:   Good peripheral perfusion. Normal rate and heart tones. Resp:   Normal effort. Lung sounds clear bilateral. Speaking without distress. Abd:   No distention. Soft, non-tender to palpation in all quadrants. No rebound or guarding. Neuro:   No focal neuro deficits noted. Moves extremities well without noted concern. Other:  Right second toe with slight abrasion, bandage atop.  No erythema no skin breakdown or redness.  No deformity.  Probable arthritic like changes are notable throughout the toes of the feet   ED Results / Procedures / Treatments   Labs (all labs ordered are listed, but only abnormal results are displayed) Labs Reviewed  CBC - Abnormal; Notable for the following components:      Result Value   RBC 3.74 (*)    Hemoglobin 11.7 (*)    HCT 35.6 (*)    All other components within normal limits  BASIC METABOLIC PANEL WITH GFR - Abnormal; Notable for the following components:   Glucose, Bld 121 (*)    BUN 26 (*)    Creatinine, Ser 1.31 (*)    GFR, Estimated 51 (*)    All other components within normal limits  URINALYSIS, ROUTINE W REFLEX MICROSCOPIC - Abnormal; Notable for the following components:   Color, Urine YELLOW (*)    APPearance CLEAR (*)    All other components within normal limits  TROPONIN T, HIGH SENSITIVITY - Abnormal; Notable for the following components:   Troponin T High Sensitivity 105 (*)    All other components within normal limits  TROPONIN T, HIGH SENSITIVITY - Abnormal; Notable for the following components:   Troponin T High Sensitivity 100 (*)    All other components within normal  limits  RESP PANEL BY  RT-PCR (RSV, FLU A&B, COVID)  RVPGX2  CULTURE, BLOOD (ROUTINE X 2)  CULTURE, BLOOD (ROUTINE X 2)  MAGNESIUM   PROCALCITONIN  LACTIC ACID, PLASMA  LACTIC ACID, PLASMA     EKG  I independently reviewed the EKG which demonstrates sinus rhythm heart rate 65 QRS 85 QTc 450.  No evidence of frank ischemia.  Artifact present   RADIOLOGY I independently reviewed images of    CT Chest Wo Contrast Result Date: 03/16/2024 CLINICAL DATA:  Pneumonia EXAM: CT CHEST WITHOUT CONTRAST TECHNIQUE: Multidetector CT imaging of the chest was performed following the standard protocol without IV contrast. RADIATION DOSE REDUCTION: This exam was performed according to the departmental dose-optimization program which includes automated exposure control, adjustment of the mA and/or kV according to patient size and/or use of iterative reconstruction technique. COMPARISON:  April 19, 2021 FINDINGS: Cardiovascular: Aortic atherosclerosis. Normal cardiac size. No pericardial effusion. Extensive coronary artery calcifications are noted. Mediastinum/Nodes: No enlarged mediastinal or axillary lymph nodes. Thyroid  gland, trachea, and esophagus demonstrate no significant findings. Lungs/Pleura: No pneumothorax or pleural effusion is noted. Interval development of multiple patchy opacities seen in the left upper and predominantly left lower lobes, concerning for multifocal pneumonia. Minimal right posterior basilar subsegmental atelectasis is noted. Upper Abdomen: Cholelithiasis. Musculoskeletal: No chest wall mass or suspicious bone lesions identified. IMPRESSION: 1. Interval development of multiple patchy opacities seen in the left upper and predominantly left lower lobes, concerning for multifocal pneumonia. 2. Extensive coronary artery calcifications are noted suggesting coronary artery disease. 3. Cholelithiasis. 4. Aortic atherosclerosis. Aortic Atherosclerosis (ICD10-I70.0). Electronically Signed   By: Lynwood Landy Raddle M.D.    On: 03/16/2024 15:10   DG Chest Portable 1 View Result Date: 03/16/2024 CLINICAL DATA:  elevated trop, eval for thoracic process, agitation/change in dementia pattern EXAM: PORTABLE CHEST 1 VIEW COMPARISON:  April 19, 2021 FINDINGS: Evaluation is limited by rotation. Question increased nodular fullness along the RIGHT perihilar border, possibly due to rotation. Cardiac silhouette is otherwise similar compared to prior given differences in technique. No pleural effusion or pneumothorax. Patchy bibasilar perihilar predominant reticulonodular opacities. IMPRESSION: 1. Patchy bibasilar perihilar predominant opacities could reflect infection, aspiration or atelectasis. 2. Rounded opacity along the RIGHT perihilar border, possibly summation artifact due to patient rotation but technically indeterminate. Recommend repeat PA and lateral chest radiograph after resolution of acute symptomatology for improved comparison. Electronically Signed   By: Corean Salter M.D.   On: 03/16/2024 13:15   CT Head Wo Contrast Result Date: 03/16/2024 EXAM: CT HEAD WITHOUT CONTRAST 03/16/2024 12:21:00 PM TECHNIQUE: CT of the head was performed without the administration of intravenous contrast. Automated exposure control, iterative reconstruction, and/or weight based adjustment of the mA/kV was utilized to reduce the radiation dose to as low as reasonably achievable. COMPARISON: CT of the head dated 09/18/2022. CLINICAL HISTORY: Delirium; Dementia with behavioral disturbance. May have had a fall, grossly evaluate for intracranial trauma. FINDINGS: BRAIN AND VENTRICLES: No acute hemorrhage. No evidence of acute infarct. Nonspecific periventricular and subcortical white matter hypoattenuation, favored to reflect chronic microvascular ischemic changes. Generalized volume loss. No hydrocephalus. No extra-axial collection. No mass effect or midline shift. Atherosclerotic calcifications of carotid siphons and intracranial vertebral  arteries. ORBITS: Bilateral lens replacement. SINUSES: No acute abnormality. SOFT TISSUES AND SKULL: No acute soft tissue abnormality. No skull fracture. IMPRESSION: 1. No acute intracranial abnormality, similar to 09/18/22. 2. Nonspecific periventricular and subcortical white matter hypoattenuation, favored to reflect chronic microvascular ischemic changes. Electronically signed by:  Evalene Coho MD 03/16/2024 12:26 PM EST RP Workstation: [id number]   DG Foot 2 Views Right Result Date: 03/16/2024 EXAM: 2 VIEW(S) XRAY OF THE RIGHT FOOT 03/16/2024 12:12:00 PM COMPARISON: None available. CLINICAL HISTORY: fall, ? injury to R 2nd toe FINDINGS: BONES AND JOINTS: Possible nondisplaced fracture of the distal aspect of the proximal phalanx of the second toe. Difficult to confirm on lateral imaging due to the overlap of bony structures. Consider dedicated imaging of the second toe, ideally with 3 views excluding adjacent overlapping bony structures. Plantar heel spur. Degenerative changes of the distal interphalangeal joints. No malalignment. SOFT TISSUES: Atherosclerotic vascular calcifications. IMPRESSION: 1. Possible nondisplaced fracture of the distal aspect of the proximal phalanx of the second toe, difficult to confirm on lateral imaging due to overlap of bony structures; differential considerations include projectional artifact or a nutrient canal. 2. Recommend dedicated 3-view radiographs of the second toe to further evaluate; if initial dedicated radiographs are negative but symptoms persist, consider repeat radiographs in 7-10 days or cross-sectional imaging (CT or MRI) for occult fracture. Electronically signed by: Waddell Calk MD 03/16/2024 12:23 PM EST RP Workstation: [id number]      Reviewed his history in light of his known dementia with behavioral disturbances previous CT of the head was normal in July 2024 I doubt based on his current presentation that an acute central neurologic cause has arisen.   He shows no evidence of headache, his speech is clear though of course confused with regard to orientation, and he shows no motor deficits.  He is normocephalic atraumatic.  However given the report of fall possibly somewhat proximate will obtain CT head to evaluate for gross traumatic injury though clinically this seems doubtful    PROCEDURES:  Critical Care performed: No  Procedures   MEDICATIONS ORDERED IN ED: Medications  sodium chloride  flush (NS) 0.9 % injection 3 mL (3 mLs Intravenous Given 03/16/24 1510)  acetaminophen  (TYLENOL ) tablet 650 mg (has no administration in time range)    Or  acetaminophen  (TYLENOL ) suppository 650 mg (has no administration in time range)  senna-docusate (Senokot-S) tablet 1 tablet (has no administration in time range)  heparin  injection 5,000 Units (has no administration in time range)  ondansetron  (ZOFRAN ) tablet 4 mg (has no administration in time range)    Or  ondansetron  (ZOFRAN ) injection 4 mg (has no administration in time range)  OLANZapine  (ZYPREXA ) injection 2.5 mg (has no administration in time range)  ziprasidone  (GEODON ) capsule 20 mg (has no administration in time range)  cefTRIAXone  (ROCEPHIN ) 2 g in sodium chloride  0.9 % 100 mL IVPB (has no administration in time range)  azithromycin  (ZITHROMAX ) 500 mg in sodium chloride  0.9 % 250 mL IVPB (has no administration in time range)  ALPRAZolam  (XANAX ) tablet 1 mg (1 mg Oral Given 03/16/24 1213)  sodium chloride  0.9 % bolus 1,000 mL (0 mLs Intravenous Stopped 03/16/24 1405)  aspirin  chewable tablet 324 mg (324 mg Oral Given 03/16/24 1300)     IMPRESSION / MDM / ASSESSMENT AND PLAN / ED COURSE  I reviewed the triage vital signs and the nursing notes.                              Based on presentation, the differential diagnosis includes, but is not limited to key considerations: altered mental DIFFERENTIAL DIAGNOSIS CONSIDERATIONS: High-acuity etiologies considered and prioritized for  rule-out: - Hypoglycemia - Hypoxia / Hypercarbia - Sepsis / Meningitis / Encephalitis -  Intracranial Hemorrhage / Stroke - Wernicke's Encephalopathy - Hypertensive Encephalopathy - Toxidrome / Withdrawal  Discussed with wife.  Understand that he has underlying dementia has had some cognitive and behavioral issues in the past but seems to have escalated predominantly in the last week.  She reports has not had any recent illnesses he has otherwise been well but seems less apt to interact with staff or him when he does it is more of a agitated attitude.  I suspect he may have a baseline of dementia with behavioral disturbance, and it seems that there is underlying worsening.  Today we will focus on evaluation medically to see if he might have underlying cause such as UTI dehydration electrolyte abnormality or something would drive this worsening.  Have consult with psychiatry.  I will not place the patient under involuntary commitment as he does not appear to be an imminent risk to himself or others.  However given his state of dementia if he does start attempting to leave, we cannot let him leave freely for his own safety.  Workup focused on identifying reversible metabolic, infectious, or structural causes.    Patient's presentation is most consistent with acute complicated illness / injury requiring diagnostic workup.    The patient is on the cardiac monitor to evaluate for evidence of arrhythmia and/or significant heart rate changes.  Clinical Course as of 03/16/24 1544  Sun Mar 16, 2024  1203 Theodore [name] [facility] Contact [phone] [email or web address] [address] [place] [address]  [MQ]  1206 Per wife has been in memory care at [facility] since Dec 8th [MQ]  1207 Obtained history from the patient's wife. [MQ]  1306 Discussed with Dr. KANDICE Bathe, of the hospitalist service.  He recommended discussing with cardiology as to whether or not the patient would benefit from  observation to the elevated troponin.  I think this is reasonable.  It is almost impossible to know if this patient had any associated chest pain as he is quite unreliable in history taking.  Sent message to Dr. Lonni to evaluate history, EKG, labs today and assist us  with decision making as to whether or not observation on hospitalist service would be of benefit or if rechecking and finding downtrending troponin would be sufficient [MQ]  1346 Case discussed with cardiology team Dr. Lonni.  Given the patient's history, current recommendation is to trend troponin.  If troponin rising or delta upward change cardiology would advise admission for serial troponins and echocardiogram tomorrow.  If troponin flat or downtrending they advised based on the patient's history, previous cardiac workups that no further intervention necessarty at this time.  [MQ]  1436 Urinalysis clear [MQ]  1438 Unclear by history if the patient could have pneumonia.  Chest x-ray some questionable infiltrate versus atelectasis.  He is not febrile and his white count is not elevated but with his severe dementia and age it seems entirely possible this could be an occult infection driving his increased agitation.  At this point we will obtain CT chest without contrast to evaluate in more detail if findings consistent with a pneumonialike pattern.  Additionally second troponin is pending. [MQ]    Clinical Course User Index [MQ] Dicky Anes, MD   CT imaging demonstrates infiltrative like pattern concerning for pneumonia.  No recent hospitalization.  Will start on Rocephin  and azithromycin .  Allergy to penicillin is a mild rash, not a contraindication to cephalosporin.  Ongoing care signed to Dr. Bathe  FINAL CLINICAL IMPRESSION(S) / ED DIAGNOSES  Final diagnoses:  Closed nondisplaced fracture of phalanx of toe of right foot, unspecified toe, initial encounter  Dementia with behavioral disturbance (HCC)  Community acquired  pneumonia, unspecified laterality     Rx / DC Orders   ED Discharge Orders     None        Note:  This document was prepared using Dragon voice recognition software and may include unintentional dictation errors.   Dicky Anes, MD 03/16/24 1544  "

## 2024-03-16 NOTE — ED Notes (Signed)
 Pt swinging at staff. Trying to climb out of the bed. Floor coverage reached out to about pt's behavior. Pt is being a danger to self and staff.

## 2024-03-16 NOTE — H&P (Addendum)
 " History and Physical    Theodore [name] FMW:[id number] DOB: [birth year] DOA: 03/16/2024  DOS: the patient was seen and examined on 03/16/2024  PCP: Marylynn Verneita CROME, MD   Patient coming from: SNF  I have personally briefly reviewed patient's old medical records in [facility] and CareEverywhere  HPI:  Theodore [name] is a 89 y.o. year old male with medical history of HTN, HLD, CKDIIIa, hypothyroidism, COPD, hx of prostate cancer, and alzheimer's disease with behavioral disturbance presenting to the ED with worsening agitation ongoing for last few weeks. This has been being managed in the memory care unit but pt refusing care at the facility and being more aggressive towards the staff.  On arrival to the ED patient was noted to be HDS stable. CBC without leukocytosis, mild anemia slightly below baseline.  BMP with slight creatinine elevation consistent with CKD otherwise.  Troponin elevated at 105, with repeat pending.  Given elevated troponin, Dr. Dicky consulted regarding further workup for this and observation for serial monitoring.  He discussed with cardiology who recommended if troponin is not downtrending then get echocardiogram but if downtrending and no complaint of chest pain with normal EKG he can be discharged from cardiac standpoint.  Given this, [facility] contacted for admission.  Review of Systems: unable to review all systems due to the inability of the patient to answer questions.   Past Medical History:  Diagnosis Date   Actinic keratosis    Allergy 1990s   Penicillin   Anxiety 1946   Arthritis    knees   Asymptomatic Sinus bradycardia    a. 05/2016 Zio: Avg HR 61 (41-167).   Benign essential tremor    Cancer (HCC) 2002   melanoma right ear   Chronic kidney disease    Mild   Colon polyp    COPD (chronic obstructive pulmonary disease) (HCC)    pt said he believes it is a misdiagnosis    Coronary artery calcification seen on CT scan    a. 06/2016 CTA Chest: cor Ca2+; b. 05/2017  MV: EF >65%. No ischemia/infarct.   COVID-19 virus infection 04/29/2020   Depression 1946   Death of brother   Dysplastic nevus 04/25/2022   Left lateral neck, severe, shave removal 06/05/22   GERD (gastroesophageal reflux disease)    History of echocardiogram    a. 04/2016 Echo: EF 60-65%, mild conc LVH. Nl PASP.   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 05/2016 Zio: Avg HR 61 (41-167). 9 SVT runs (fastest 167 - 5 beats; longest 8 beats - 101 bpm). Rare PACs/PVCs.   Pre-syncope    a. 06/2022 Zio: Predominantly sinus bradycardia with an average heart rate of 53 (40-148).  1 run of NSVT x 17 beats (143 bpm), 19 SVT runs (fastest 148 x 7 beats; longest 11 beats @ 114 bpm).  Rare PVCs/PACs. Triggered events assoc w/ sinus rhythym.   PSVT (paroxysmal supraventricular tachycardia)    a. 05/2016 Zio: 9 SVT runs; b. 06/2022 Zio: 19 SVT runs, fastest 148, longest 11 beats.  PVC's (premature ventricular contractions)    Rhinitis    Sepsis due to cellulitis (HCC) 11/28/2021   Severe sepsis (HCC) 11/28/2021   Wears dentures    partial upper and lower   Wears hearing aid in both ears     Past Surgical History:  Procedure Laterality Date   ADENOIDECTOMY     BRAIN SURGERY     Nonessential tremors   CATARACT EXTRACTION W/PHACO Left 10/22/2017   Procedure: CATARACT EXTRACTION PHACO AND INTRAOCULAR LENS PLACEMENT (IOC)  LEFT;  Surgeon: Myrna Adine Anes, MD;  Location: [facility];  Service: Ophthalmology;  Laterality: Left;   CATARACT EXTRACTION W/PHACO Right 11/20/2017   Procedure: CATARACT EXTRACTION PHACO AND INTRAOCULAR LENS PLACEMENT (IOC) RIGHT;   Surgeon: Myrna Adine Anes, MD;  Location: [facility];  Service: Ophthalmology;  Laterality: Right;   ESOPHAGOGASTRODUODENOSCOPY (EGD) WITH PROPOFOL  N/A 10/31/2016   Procedure: ESOPHAGOGASTRODUODENOSCOPY (EGD) WITH PROPOFOL ;  Surgeon: Gaylyn Gladis PENNER, MD;  Location: [facility];  Service: Endoscopy;  Laterality: N/A;   I & D EXTREMITY Right 11/29/2021   Procedure: IRRIGATION AND DEBRIDEMENT EXTREMITY;  Surgeon: Desiderio Schanz, MD;  Location: [facility];  Service: General;  Laterality: Right;   JOINT REPLACEMENT Right 2003   knee   knee meniscus repair Right    LEG WOUND REPAIR / CLOSURE  11/2021   MELANOMA EXCISION Right    ear. Followed by Dr. Hester   PATELLECTOMY Bilateral    TONSILLECTOMY       Allergies[1]  Family History  Problem Relation Age of Onset   Hypertension Mother    Heart disease Mother        CHF   Heart disease Father    Cancer Sister        melanoma    Prior to Admission medications  Medication Sig Start Date End Date Taking? Authorizing Provider  Acetaminophen  500 MG capsule Take 2 capsules (1,000 mg total) by mouth every 12 (twelve) hours. 02/14/24   Marylynn Verneita CROME, MD  ALPRAZolam  (XANAX ) 1 MG tablet Take 1 tablet (1 mg total) by mouth 2 (two) times daily. MAY give additional dose  0.5 MG daily  IF NEEDED FOR AGITATION 02/13/24   Marylynn Verneita CROME, MD  ascorbic acid  (VITAMIN C ) 500 MG tablet Take 500 mg by mouth daily.    [provider]  brexpiprazole  (REXULTI ) 1 MG TABS tablet Take 0.5 tablets (0.5 mg total) by mouth daily for 7 days, THEN 1 tablet (1 mg total) daily. 02/29/24 03/07/25  Laurence Locus, DO  busPIRone  (BUSPAR ) 10 MG tablet Take 1 tablet (10 mg total) by mouth 3 (three) times daily. 01/21/24   Marylynn Verneita CROME, MD  Cholecalciferol  (VITAMIN D3) 125 MCG (5000 UT) TABS Take 1 tablet by mouth daily.    [provider]  cycloSPORINE  (RESTASIS ) 0.05 % ophthalmic emulsion Place 1 drop into both eyes 2 (two) times  daily. Patient not taking: Reported on 02/29/2024    [provider]  ezetimibe  (ZETIA ) 10 MG tablet TAKE 1 TABLET BY MOUTH EVERY DAY 09/25/23   Gollan, Timothy J, MD  famotidine  (PEPCID ) 20 MG tablet Take 20 mg by mouth 2 (two) times daily.    [provider]  fluticasone  (FLONASE ) 50 MCG/ACT nasal spray Place 1 spray into both nostrils daily.    [provider]  Folic Acid  (FOLATE PO) Take 680 mcg by mouth daily.    [provider]  levothyroxine  (SYNTHROID ) 100 MCG tablet TAKE 1 TABLET BY MOUTH EVERY DAY BEFORE  BREAKFAST 06/21/23   Marylynn Verneita CROME, MD  LORazepam  (ATIVAN ) 0.5 MG tablet Take 1 tablet (0.5 mg total) by mouth every 6 (six) hours as needed for anxiety (Apply 1ml- 0.5 mg topically every 6 hours as needed for agitation). 03/12/24   Gil Greig BRAVO, NP  Polyethyl Glycol-Propyl Glycol (SYSTANE) 0.4-0.3 % SOLN Apply to eye.    [provider]  polyethylene glycol (MIRALAX  / GLYCOLAX ) 17 g packet Take 17 g by mouth daily.    [provider]  Probiotic Product (MISC INTESTINAL FLORA REGULAT) CAPS Take by mouth.    [provider]  QUEtiapine  (SEROQUEL ) 50 MG tablet Take 1 tablet (50 mg total) by mouth at bedtime. 01/14/24   Tullo, Teresa L, MD  senna (SENOKOT) 8.6 MG TABS tablet Take 1 tablet by mouth at bedtime as needed for mild constipation.    [provider]  sertraline  (ZOLOFT ) 100 MG tablet Take 1.5 tablets (150 mg total) by mouth daily. 02/13/24   Tullo, Teresa L, MD  vitamin B-12 (CYANOCOBALAMIN ) 500 MCG tablet Take 1 tablet by mouth daily.    [provider]    Social History:  reports that he has never smoked. He has never used smokeless tobacco. He reports that he does not currently use alcohol . He reports that he does not use drugs.  Lives at memory care unit. Does not drink or smoke ciga.rette.   Physical Exam: Vitals:   03/16/24 1330 03/16/24 1400 03/16/24 1430 03/16/24 1500  BP: (!) 108/95 (!)  172/80 (!) 181/74 (!) 174/68  Pulse: (!) 103     Resp: (!) 25 (!) 27 (!) 24 (!) 22  Temp:      TempSrc:      SpO2:  99%  100%  Weight:      Height:        Gen: NAD HENT: NCAT CV: normal heart sounds Lung: CTAB Abd: No TTP, normal bowel sounds MSK: No asymmetry, good bulk and tone, slight erythema of 2nd toe of right foot Neuro: alert and but disoriented to self, place or time   Labs on Admission: I have personally reviewed following labs and imaging studies  CBC: Recent Labs  Lab 03/16/24 1200  WBC 8.0  HGB 11.7*  HCT 35.6*  MCV 95.2  PLT 285   Basic Metabolic Panel: Recent Labs  Lab 03/16/24 1200 03/16/24 1405  NA 141  --   K 5.0  --   CL 103  --   CO2 27  --   GLUCOSE 121*  --   BUN 26*  --   CREATININE 1.31*  --   CALCIUM  9.2  --   MG  --  2.1   GFR: Estimated Creatinine Clearance: 37.2 mL/min (A) (by C-G formula based on SCr of 1.31 mg/dL (H)). Liver Function Tests: No results for input(s): AST, ALT, ALKPHOS, BILITOT, PROT, ALBUMIN  in the last 168 hours. No results for input(s): LIPASE, AMYLASE in the last 168 hours. No results for input(s): AMMONIA in the last 168 hours. Coagulation Profile: No results for input(s): INR, PROTIME in the last 168 hours. Cardiac Enzymes: No results for input(s): CKTOTAL, CKMB, CKMBINDEX, TROPONINI, TROPONINIHS in the last 168 hours. BNP (last 3 results) No results for input(s): BNP in the last 8760 hours. HbA1C: No results for input(s): HGBA1C in the last 72 hours. CBG: No results for input(s): GLUCAP in the last 168 hours. Lipid Profile: No results for input(s): CHOL, HDL, LDLCALC, TRIG, CHOLHDL, LDLDIRECT in the last 72  hours. Thyroid  Function Tests: No results for input(s): TSH, T4TOTAL, FREET4, T3FREE, THYROIDAB in the last 72 hours. Anemia Panel: No results for input(s): VITAMINB12, FOLATE, FERRITIN, TIBC, IRON, RETICCTPCT in the last 72  hours. Urine analysis:    Component Value Date/Time   COLORURINE YELLOW (A) 03/16/2024 1350   APPEARANCEUR CLEAR (A) 03/16/2024 1350   LABSPEC 1.012 03/16/2024 1350   PHURINE 6.0 03/16/2024 1350   GLUCOSEU NEGATIVE 03/16/2024 1350   HGBUR NEGATIVE 03/16/2024 1350   BILIRUBINUR NEGATIVE 03/16/2024 1350   KETONESUR NEGATIVE 03/16/2024 1350   PROTEINUR NEGATIVE 03/16/2024 1350   NITRITE NEGATIVE 03/16/2024 1350   LEUKOCYTESUR NEGATIVE 03/16/2024 1350    Radiological Exams on Admission: I have personally reviewed images CT Chest Wo Contrast Result Date: 03/16/2024 CLINICAL DATA:  Pneumonia EXAM: CT CHEST WITHOUT CONTRAST TECHNIQUE: Multidetector CT imaging of the chest was performed following the standard protocol without IV contrast. RADIATION DOSE REDUCTION: This exam was performed according to the departmental dose-optimization program which includes automated exposure control, adjustment of the mA and/or kV according to patient size and/or use of iterative reconstruction technique. COMPARISON:  April 19, 2021 FINDINGS: Cardiovascular: Aortic atherosclerosis. Normal cardiac size. No pericardial effusion. Extensive coronary artery calcifications are noted. Mediastinum/Nodes: No enlarged mediastinal or axillary lymph nodes. Thyroid  gland, trachea, and esophagus demonstrate no significant findings. Lungs/Pleura: No pneumothorax or pleural effusion is noted. Interval development of multiple patchy opacities seen in the left upper and predominantly left lower lobes, concerning for multifocal pneumonia. Minimal right posterior basilar subsegmental atelectasis is noted. Upper Abdomen: Cholelithiasis. Musculoskeletal: No chest wall mass or suspicious bone lesions identified. IMPRESSION: 1. Interval development of multiple patchy opacities seen in the left upper and predominantly left lower lobes, concerning for multifocal pneumonia. 2. Extensive coronary artery calcifications are noted suggesting coronary  artery disease. 3. Cholelithiasis. 4. Aortic atherosclerosis. Aortic Atherosclerosis (ICD10-I70.0). Electronically Signed   By: Lynwood Landy Raddle M.D.   On: 03/16/2024 15:10   DG Chest Portable 1 View Result Date: 03/16/2024 CLINICAL DATA:  elevated trop, eval for thoracic process, agitation/change in dementia pattern EXAM: PORTABLE CHEST 1 VIEW COMPARISON:  April 19, 2021 FINDINGS: Evaluation is limited by rotation. Question increased nodular fullness along the RIGHT perihilar border, possibly due to rotation. Cardiac silhouette is otherwise similar compared to prior given differences in technique. No pleural effusion or pneumothorax. Patchy bibasilar perihilar predominant reticulonodular opacities. IMPRESSION: 1. Patchy bibasilar perihilar predominant opacities could reflect infection, aspiration or atelectasis. 2. Rounded opacity along the RIGHT perihilar border, possibly summation artifact due to patient rotation but technically indeterminate. Recommend repeat PA and lateral chest radiograph after resolution of acute symptomatology for improved comparison. Electronically Signed   By: Corean Salter M.D.   On: 03/16/2024 13:15   CT Head Wo Contrast Result Date: 03/16/2024 EXAM: CT HEAD WITHOUT CONTRAST 03/16/2024 12:21:00 PM TECHNIQUE: CT of the head was performed without the administration of intravenous contrast. Automated exposure control, iterative reconstruction, and/or weight based adjustment of the mA/kV was utilized to reduce the radiation dose to as low as reasonably achievable. COMPARISON: CT of the head dated 09/18/2022. CLINICAL HISTORY: Delirium; Dementia with behavioral disturbance. May have had a fall, grossly evaluate for intracranial trauma. FINDINGS: BRAIN AND VENTRICLES: No acute hemorrhage. No evidence of acute infarct. Nonspecific periventricular and subcortical white matter hypoattenuation, favored to reflect chronic microvascular ischemic changes. Generalized volume loss. No  hydrocephalus. No extra-axial collection. No mass effect or midline shift. Atherosclerotic calcifications of carotid siphons and intracranial vertebral arteries. ORBITS: Bilateral  lens replacement. SINUSES: No acute abnormality. SOFT TISSUES AND SKULL: No acute soft tissue abnormality. No skull fracture. IMPRESSION: 1. No acute intracranial abnormality, similar to 09/18/22. 2. Nonspecific periventricular and subcortical white matter hypoattenuation, favored to reflect chronic microvascular ischemic changes. Electronically signed by: Evalene Coho MD 03/16/2024 12:26 PM EST RP Workstation: [id number]   DG Foot 2 Views Right Result Date: 03/16/2024 EXAM: 2 VIEW(S) XRAY OF THE RIGHT FOOT 03/16/2024 12:12:00 PM COMPARISON: None available. CLINICAL HISTORY: fall, ? injury to R 2nd toe FINDINGS: BONES AND JOINTS: Possible nondisplaced fracture of the distal aspect of the proximal phalanx of the second toe. Difficult to confirm on lateral imaging due to the overlap of bony structures. Consider dedicated imaging of the second toe, ideally with 3 views excluding adjacent overlapping bony structures. Plantar heel spur. Degenerative changes of the distal interphalangeal joints. No malalignment. SOFT TISSUES: Atherosclerotic vascular calcifications. IMPRESSION: 1. Possible nondisplaced fracture of the distal aspect of the proximal phalanx of the second toe, difficult to confirm on lateral imaging due to overlap of bony structures; differential considerations include projectional artifact or a nutrient canal. 2. Recommend dedicated 3-view radiographs of the second toe to further evaluate; if initial dedicated radiographs are negative but symptoms persist, consider repeat radiographs in 7-10 days or cross-sectional imaging (CT or MRI) for occult fracture. Electronically signed by: Waddell Calk MD 03/16/2024 12:23 PM EST RP Workstation: [id number]    EKG: My personal interpretation of EKG shows: Normal sinus rhythm negative  ST changes    Assessment/Plan Principal Problem:   Multifocal pneumonia Active Problems:   Elevated troponin   Acquired hypothyroidism   Hyperlipidemia   Moderate Alzheimer dementia with agitation (HCC)   PAD (peripheral artery disease)   Chronic obstructive pulmonary disease (HCC)   Chronic kidney disease, stage 3b (HCC)   GAD (generalized anxiety disorder)   GERD without esophagitis   Pt with some coughing and worsening agitation with image finding showing multifocal pneumonia. Getting IV abx with CAP coverage. Bcx ordered. Will trend WBC, monitor for fevers. He can be switched to oral therapy soon given he is on room air.   Elevated troponin: No reported chest pain but patient is not reliable historian and has severe dementia.  Elevation of troponin could be multifactorial given his pneumonia, elevated blood pressure for renal clearance.  Given is quite elevated, will serially monitor and get echocardiogram.   Right 2nd Toe Fracture: Possible fracture seen on foot xray. Dedicated 2nd toe radiography ordered. Patient not complaining of any pain, and no TTP with palpation. There is some erythema.   Alzheimer's dementia with behavioral disturbances: Patient on multiple medications including Seroquel  50 mg nightly, Zoloft  150 mg, alprazolam  1 mg twice daily.  Psychiatry has been consulted regarding his behavioral disturbances.  He may benefit from increasing his Seroquel  and limiting his benzodiazepine use as it can lead to paradoxical agitation in the elderly.  Hypertension: Not on any pharmacotherapy.  Elevated blood pressure.  Will monitor and start low-dose amlodipine if needed.  Hypothyroidism: On Synthroid  100 mg.  Given last TSH was 0.4, will repeat TSH  GERD: Patient is on Pepcid  20 mg twice daily.  GAD: On sertraline  as above, BuSpar  10 mg 3 times daily and Rexulti   VTE prophylaxis:  SQ Heparin   Diet:HH Code Status:  DNR/DNI(Do NOT Intubate) Telemetry:  Admission  status: Observation, Telemetry bed Patient is from: Home Anticipated d/c is to: Home Anticipated d/c is in: 1-2 days   Family Communication: Updated at bedside  Consults  called: None   Severity of Illness: The appropriate patient status for this patient is OBSERVATION. Observation status is judged to be reasonable and necessary in order to provide the required intensity of service to ensure the patient's safety. The patient's presenting symptoms, physical exam findings, and initial radiographic and laboratory data in the context of their medical condition is felt to place them at decreased risk for further clinical deterioration. Furthermore, it is anticipated that the patient will be medically stable for discharge from the hospital within 2 midnights of admission.    Morene Bathe, MD Jol[facility] DEL. [facility]     [1]  Allergies Allergen Reactions   Penicillins Rash and Other (See Comments)    Other reaction(s): Other (see comments) Other reaction(s): UNKNOWN   Benzonatate      Pruritic rash   Lexapro  [Escitalopram ] Other (See Comments)    Increased hand tremor   Molnupiravir      Pruritic rash on neck    "

## 2024-03-16 NOTE — Hospital Course (Addendum)
 Theodore [name]

## 2024-03-16 NOTE — ED Triage Notes (Signed)
 Pt to ED via [facility] from Memory Care at [facility] for increased agitation over the last couple weeks that is progressively getting worse. Pt also not eating and per EMS uncooperative on all fronts. Pt has become violent and hitting/throwing things at facility. Dr at the facility dx with psychosis, meds used at facility are no longer working and pt is refusing meds as well. Facility Dr states if pt continues to refuse meds would need an order for liquid meds. EMS also reports pt is more aggressive with females. Pt presents with DNR.

## 2024-03-16 NOTE — ED Notes (Signed)
 Pt covered with warm blankets.

## 2024-03-16 NOTE — ED Notes (Signed)
 Pt refusing to wear O2 sensor, tried to explain need to monitor however pt continues to pull off sensor.

## 2024-03-16 NOTE — Consult Note (Signed)
 Code Status: DNR Consulting Service: Psychiatry Reason for Consultation: Acute agitation, confusion, behavioral disturbance; assess need for psychiatric admission vs. medical stabilization  Identifying Information  [name] is a 89 year old male residing in a memory care unit at [facility] who was brought to the emergency department via [facility] for progressively worsening agitation, confusion, medication refusal, and violent behavior over the past several weeks.  Chief Complaint  Per patient (limited reliability): Disorganized speech about his wife and unrelated topics. Per facility/EMS: Increased agitation, not eating, refusing meds, becoming violent.  History of Present Illness  I personally evaluated this patient in the emergency department. He is severely confused, nonsensical, tangential, and difficult to redirect. He repeatedly talks about his wife and various unrelated subjects and is unable to provide a coherent or goal-directed account of events. He is not able to meaningfully participate in the interview.  Per EMS and [facility] staff, the patient has experienced progressive behavioral deterioration over the last several weeks, including:  Increasing agitation and aggression  Violent behavior, including hitting and throwing objects at staff  Refusal of medications and food  Poor cooperation with care  Psychiatric medications previously effective are no longer working  Facility physician diagnosed psychosis and noted that continued refusal of medications may necessitate liquid formulations  EMS reports the patient is more aggressive toward male staff  From a medical standpoint, the patients stability is unclear, and his presentation raises significant concern for an underlying medical etiology contributing to delirium. At this time, he cannot be considered medically cleared for psychiatric admission.  Collateral Information  Source: [facility] / Facility Physician / EMS  Summary: Subacute but progressive decline with agitation, violence, refusal of care, and poor response to psychiatric medications. Facility is unable to safely manage him at this time.  Past Psychiatric History  History of irritability and behavioral disturbance per facility  Recent diagnosis of psychosis by facility physician  No reliable history of prior psychiatric hospitalizations available  Multiple recent medication trials/adjustments with limited benefit  Past Medical History  Advanced age (34 years)  Dementia (memory care resident)  Additional medical history not fully available at time of evaluation  Medications  Currently refusing medications  Full medication reconciliation pending  Allergies  Not available at time of evaluation  Substance Use History  Unable to assess reliably  No collateral evidence of active substance use  Family and Social History  Resides in memory care unit at [facility]  Marital status unclear; patient perseverates on wife  No reliable family history obtainable due to cognitive impairment  Mental Status Examination  Appearance: Elderly male, disheveled, appears acutely confused  Behavior: Restless, poorly cooperative, difficult to redirect  Speech: Disorganized, tangential, nonsensical  Mood: Unable to clearly articulate  Affect: Labile, occasionally irritable  Thought Process: Severely disorganized  Thought Content: Perseverative on wife and random topics; no coherent delusional system elicited due to confusion  Perception: Unable to reliably assess hallucinations  Orientation: Disoriented  Attention/Concentration: Severely impaired  Memory: Severely impaired  Insight: Poor  Judgment: Poor  Suicide and Violence Risk Assessment  Suicidal Ideation: Unable to reliably assess due to severe confusion; no direct suicidal statements elicited  Homicidal Ideation: Unable to  reliably assess; documented violent behavior toward others per collateral  Risk Factors: Advanced age, dementia, acute mental status change, agitation, refusal of food/meds, insomnia, aggression  Protective Factors: Supervised setting (currently insufficient)  Overall Risk Assessment:  Suicide risk: Indeterminate due to delirium-level impairment  Violence risk: High, given recent physical aggression and inability to control impulses  Assessment  [name] is a 89 year old male with dementia presenting with acute to subacute worsening confusion, agitation, aggression, and refusal of care, representing a significant deviation from baseline. His presentation is most consistent with delirium or another underlying medical process, rather than a primary psychiatric disorder alone.  At this time, the patient is not medically cleared and not appropriate for inpatient psychiatric admission until medical causes are adequately evaluated and treated.  Differential Diagnosis  Delirium due to medical condition (infection, metabolic abnormality, dehydration, medication effect, aspiration, pain)  Major neurocognitive disorder with behavioral disturbance  Medication-induced delirium or toxicity  Psychosis secondary to medical condition  Primary psychotic disorder (less likely given age and acute onset)  Medical Necessity  Given the patients acute mental status changes, aggressive behavior, refusal of food and medications, and inability to ensure safety, continued medical hospitalization and diagnostic evaluation are medically necessary prior to any psychiatric disposition.  Recommendations / Plan  Medical admission and clearance are required prior to consideration of psychiatric admission.  Complete delirium workup, including labs, infectious evaluation, medication review, and imaging as clinically indicated.  Psychiatric medication recommendation (off-label):  Start GE0DON 20 mg BID on  an off-label basis for agitation and behavioral disturbance, with close monitoring given advanced age and medical comorbidities.  Monitor closely for sedation, extrapyramidal symptoms, QTc prolongation, aspiration risk, and falls.  Psychiatry to continue to follow during medical hospitalization.  Once medically stable and cleared, reassess need for psychiatric inpatient admission if severe behavioral symptoms persist.  Disposition  The patient is not currently appropriate for psychiatric admission. Priority should be given to medical stabilization and resolution of underlying medical issues. Psychiatric admission may be reconsidered after medical clearance.

## 2024-03-16 NOTE — ED Notes (Signed)
 Pt taken to CT.

## 2024-03-16 NOTE — ED Notes (Signed)
 Pt stating need to void, pt given urinal and helped to void.

## 2024-03-16 NOTE — ED Notes (Signed)
 Repositioned pt back into the bed as both of pts legs was hanging over the bed. Informed RN, Suzen

## 2024-03-16 NOTE — ED Notes (Signed)
 Pt attempting to climb out of bed over side rails. Pt repositioned in bed, bed placed in trendelenburg.

## 2024-03-16 NOTE — ED Notes (Signed)
 Pt's bed changed, pt placed in clean brief and hospital gown. [facility]itors from [facility] at bedside to see pt.

## 2024-03-16 NOTE — ED Notes (Signed)
 Pt asking to take IV out as it pinches him when he bends his arm. IV removed

## 2024-03-16 NOTE — ED Notes (Signed)
 After pt swallowed ASA gave pt some sips of water with pt getting choked up, stated I swallowed some down the wrong home.

## 2024-03-17 ENCOUNTER — Observation Stay
Admit: 2024-03-17 | Discharge: 2024-03-17 | Disposition: A | Discharge status: Home / Self Care | Attending: Internal Medicine | Admitting: Internal Medicine

## 2024-03-17 DIAGNOSIS — Z681 Body mass index (BMI) 19 or less, adult: Secondary | ICD-10-CM | POA: Diagnosis not present

## 2024-03-17 DIAGNOSIS — R7989 Other specified abnormal findings of blood chemistry: Secondary | ICD-10-CM

## 2024-03-17 DIAGNOSIS — I2510 Atherosclerotic heart disease of native coronary artery without angina pectoris: Secondary | ICD-10-CM | POA: Diagnosis present

## 2024-03-17 DIAGNOSIS — X58XXXA Exposure to other specified factors, initial encounter: Secondary | ICD-10-CM | POA: Diagnosis present

## 2024-03-17 DIAGNOSIS — N1832 Chronic kidney disease, stage 3b: Secondary | ICD-10-CM | POA: Diagnosis present

## 2024-03-17 DIAGNOSIS — G309 Alzheimer's disease, unspecified: Secondary | ICD-10-CM | POA: Diagnosis present

## 2024-03-17 DIAGNOSIS — I10 Essential (primary) hypertension: Secondary | ICD-10-CM | POA: Diagnosis not present

## 2024-03-17 DIAGNOSIS — Z66 Do not resuscitate: Secondary | ICD-10-CM | POA: Diagnosis present

## 2024-03-17 DIAGNOSIS — F05 Delirium due to known physiological condition: Secondary | ICD-10-CM | POA: Diagnosis present

## 2024-03-17 DIAGNOSIS — K219 Gastro-esophageal reflux disease without esophagitis: Secondary | ICD-10-CM | POA: Diagnosis present

## 2024-03-17 DIAGNOSIS — Z8616 Personal history of COVID-19: Secondary | ICD-10-CM | POA: Diagnosis not present

## 2024-03-17 DIAGNOSIS — I129 Hypertensive chronic kidney disease with stage 1 through stage 4 chronic kidney disease, or unspecified chronic kidney disease: Secondary | ICD-10-CM | POA: Diagnosis present

## 2024-03-17 DIAGNOSIS — F02C11 Dementia in other diseases classified elsewhere, severe, with agitation: Secondary | ICD-10-CM | POA: Diagnosis present

## 2024-03-17 DIAGNOSIS — E43 Unspecified severe protein-calorie malnutrition: Secondary | ICD-10-CM | POA: Diagnosis present

## 2024-03-17 DIAGNOSIS — Z79890 Hormone replacement therapy (postmenopausal): Secondary | ICD-10-CM | POA: Diagnosis not present

## 2024-03-17 DIAGNOSIS — Z1152 Encounter for screening for COVID-19: Secondary | ICD-10-CM | POA: Diagnosis not present

## 2024-03-17 DIAGNOSIS — J188 Other pneumonia, unspecified organism: Secondary | ICD-10-CM | POA: Diagnosis not present

## 2024-03-17 DIAGNOSIS — I739 Peripheral vascular disease, unspecified: Secondary | ICD-10-CM | POA: Diagnosis present

## 2024-03-17 DIAGNOSIS — E039 Hypothyroidism, unspecified: Secondary | ICD-10-CM | POA: Diagnosis present

## 2024-03-17 DIAGNOSIS — R296 Repeated falls: Secondary | ICD-10-CM | POA: Diagnosis present

## 2024-03-17 DIAGNOSIS — F02B11 Dementia in other diseases classified elsewhere, moderate, with agitation: Secondary | ICD-10-CM | POA: Diagnosis not present

## 2024-03-17 DIAGNOSIS — Z515 Encounter for palliative care: Secondary | ICD-10-CM | POA: Diagnosis not present

## 2024-03-17 DIAGNOSIS — J189 Pneumonia, unspecified organism: Secondary | ICD-10-CM | POA: Diagnosis present

## 2024-03-17 DIAGNOSIS — E785 Hyperlipidemia, unspecified: Secondary | ICD-10-CM | POA: Diagnosis present

## 2024-03-17 DIAGNOSIS — E860 Dehydration: Secondary | ICD-10-CM | POA: Diagnosis present

## 2024-03-17 DIAGNOSIS — G301 Alzheimer's disease with late onset: Secondary | ICD-10-CM | POA: Diagnosis not present

## 2024-03-17 DIAGNOSIS — F411 Generalized anxiety disorder: Secondary | ICD-10-CM | POA: Diagnosis present

## 2024-03-17 DIAGNOSIS — S92501A Displaced unspecified fracture of right lesser toe(s), initial encounter for closed fracture: Secondary | ICD-10-CM | POA: Diagnosis present

## 2024-03-17 DIAGNOSIS — J440 Chronic obstructive pulmonary disease with acute lower respiratory infection: Secondary | ICD-10-CM | POA: Diagnosis present

## 2024-03-17 LAB — ECHOCARDIOGRAM COMPLETE
AR max vel: 2.63 cm2
AV Area VTI: 2.65 cm2
AV Area mean vel: 2.61 cm2
AV Mean grad: 3 mmHg
AV Peak grad: 5 mmHg
Ao pk vel: 1.12 m/s
Area-P 1/2: 3.31 cm2
Calc EF: 49.1 %
Height: 76 in
MV VTI: 1.91 cm2
S' Lateral: 2.3 cm
Single Plane A2C EF: 46.6 %
Single Plane A4C EF: 54.5 %
Weight: 2574.97 [oz_av]

## 2024-03-17 LAB — BASIC METABOLIC PANEL WITH GFR
Anion gap: 11 (ref 5–15)
BUN: 21 mg/dL (ref 8–23)
CO2: 24 mmol/L (ref 22–32)
Calcium: 9.2 mg/dL (ref 8.9–10.3)
Chloride: 108 mmol/L (ref 98–111)
Creatinine, Ser: 1.25 mg/dL — ABNORMAL HIGH (ref 0.61–1.24)
GFR, Estimated: 54 mL/min — ABNORMAL LOW
Glucose, Bld: 94 mg/dL (ref 70–99)
Potassium: 4.5 mmol/L (ref 3.5–5.1)
Sodium: 143 mmol/L (ref 135–145)

## 2024-03-17 LAB — CBC
HCT: 33.8 % — ABNORMAL LOW (ref 39.0–52.0)
Hemoglobin: 11.3 g/dL — ABNORMAL LOW (ref 13.0–17.0)
MCH: 31.5 pg (ref 26.0–34.0)
MCHC: 33.4 g/dL (ref 30.0–36.0)
MCV: 94.2 fL (ref 80.0–100.0)
Platelets: 290 K/uL (ref 150–400)
RBC: 3.59 MIL/uL — ABNORMAL LOW (ref 4.22–5.81)
RDW: 13.5 % (ref 11.5–15.5)
WBC: 7.6 K/uL (ref 4.0–10.5)
nRBC: 0 % (ref 0.0–0.2)

## 2024-03-17 LAB — CBG MONITORING, ED
Glucose-Capillary: 61 mg/dL — ABNORMAL LOW (ref 70–99)
Glucose-Capillary: 139 mg/dL — ABNORMAL HIGH (ref 70–99)

## 2024-03-17 MED ORDER — RISPERIDONE 1 MG/ML PO SOLN
0.5000 mg | Freq: Two times a day (BID) | ORAL | Status: DC
  Administered 2024-03-17 (×2): 0.5 mg via ORAL
  Filled 2024-03-17 (×3): qty 0.5

## 2024-03-17 MED ORDER — GLYCOPYRROLATE 0.2 MG/ML IJ SOLN
0.2000 mg | INTRAMUSCULAR | Status: DC | PRN
  Administered 2024-03-19: 0.2 mg via INTRAVENOUS
  Filled 2024-03-17: qty 1

## 2024-03-17 MED ORDER — MORPHINE SULFATE (PF) 2 MG/ML IV SOLN
2.0000 mg | INTRAVENOUS | Status: DC | PRN
  Administered 2024-03-19 – 2024-03-20 (×4): 4 mg via INTRAVENOUS
  Administered 2024-03-20 (×3): 2 mg via INTRAVENOUS
  Administered 2024-03-20: 4 mg via INTRAVENOUS
  Filled 2024-03-17: qty 2
  Filled 2024-03-17: qty 1
  Filled 2024-03-17 (×3): qty 2
  Filled 2024-03-17 (×2): qty 1
  Filled 2024-03-17: qty 2
  Filled 2024-03-17: qty 1

## 2024-03-17 MED ORDER — LORAZEPAM 2 MG/ML IJ SOLN
2.0000 mg | INTRAMUSCULAR | Status: DC | PRN
  Administered 2024-03-17: 2 mg via INTRAVENOUS
  Administered 2024-03-18 (×2): 4 mg via INTRAVENOUS
  Administered 2024-03-19: 2 mg via INTRAVENOUS
  Filled 2024-03-17: qty 2
  Filled 2024-03-17 (×2): qty 1
  Filled 2024-03-17: qty 2

## 2024-03-17 MED ORDER — DEXTROSE 50 % IV SOLN
INTRAVENOUS | Status: AC
  Administered 2024-03-17: 25 g via INTRAVENOUS
  Filled 2024-03-17: qty 50

## 2024-03-17 MED ORDER — HALOPERIDOL LACTATE 5 MG/ML IJ SOLN: 5.0000 mg | Freq: Four times a day (QID) | INTRAMUSCULAR | Status: DC | PRN

## 2024-03-17 MED ORDER — ACETAMINOPHEN 325 MG PO TABS: 650.0000 mg | ORAL_TABLET | Freq: Four times a day (QID) | ORAL | Status: DC | PRN

## 2024-03-17 MED ORDER — ACETAMINOPHEN 650 MG RE SUPP: 650.0000 mg | Freq: Four times a day (QID) | RECTAL | Status: DC | PRN

## 2024-03-17 MED ORDER — HALOPERIDOL LACTATE 5 MG/ML IJ SOLN
5.0000 mg | Freq: Once | INTRAMUSCULAR | Status: AC
  Administered 2024-03-17: 5 mg via INTRAVENOUS
  Filled 2024-03-17: qty 1

## 2024-03-17 MED ORDER — DEXTROSE 50 % IV SOLN: 25.0000 g | Freq: Once | INTRAVENOUS | Status: AC

## 2024-03-17 MED ORDER — GLYCOPYRROLATE 1 MG PO TABS: 1.0000 mg | ORAL_TABLET | ORAL | Status: DC | PRN

## 2024-03-17 MED ORDER — DIPHENHYDRAMINE HCL 50 MG/ML IJ SOLN: 25.0000 mg | INTRAMUSCULAR | Status: DC | PRN

## 2024-03-17 MED ORDER — SODIUM CHLORIDE 0.9 % IV SOLN: INTRAVENOUS | Status: DC

## 2024-03-17 MED ORDER — POLYVINYL ALCOHOL 1.4 % OP SOLN: 1.0000 [drp] | Freq: Four times a day (QID) | OPHTHALMIC | Status: DC | PRN

## 2024-03-17 MED ORDER — GLYCOPYRROLATE 0.2 MG/ML IJ SOLN: 0.2000 mg | INTRAMUSCULAR | Status: DC | PRN

## 2024-03-17 NOTE — ED Notes (Signed)
 Pt provided with dinner tray ate about 50% of tray

## 2024-03-17 NOTE — Plan of Care (Signed)
 Consult received for palliative medicine team to assist with management of behavioral disturbances related to advanced dementia for Theodore [name]. Patient already followed by [facility] at [facility] memory care. Discussed with Dr. Kandis and Theodore [name], [facility] liaison, need for management of symptoms.  Theodore advises he is following patient at this time and does not need assistance from PMT.  He advises patient's symptoms need to be managed prior to assessing for [facility] hospice admission.  Dr. Kandis will attempt to get symptoms under control for possible [facility] admission. [facility] following patient through admission and discharge.  Thank you for consulting PMT.  Please reconsult if other needs or concerns arise.    Devere Sacks, ELNITA- [facility] Palliative Medicine Team  03/17/2024 4:23 PM  Office [phone]  Pager [phone]  No charge

## 2024-03-17 NOTE — Progress Notes (Signed)
 " PROGRESS NOTE    HIPOLITO MARTINEZLOPEZ  FMW:[id number] DOB: 1929/12/07 DOA: 03/16/2024 PCP: Marylynn Verneita CROME, MD     Brief Narrative:   Theodore [name] is a 89 y.o. year old male with medical history of HTN, HLD, CKDIIIa, hypothyroidism, COPD, hx of prostate cancer, and alzheimer's disease with behavioral disturbance presenting to the ED with worsening agitation ongoing for last few weeks. This has been being managed in the memory care unit but pt refusing care at the facility and being more aggressive towards the staff. On arrival to the ED patient was noted to be HDS stable. CBC without leukocytosis, mild anemia slightly below baseline. BMP with slight creatinine elevation consistent with CKD otherwise. Troponin elevated at 105, with repeat pending. Given elevated troponin, Dr. Dicky consulted regarding further workup for this and observation for serial monitoring. He discussed with cardiology who recommended if troponin is not downtrending then get echocardiogram but if downtrending and no complaint of chest pain with normal EKG he can be discharged from cardiac standpoint. Given this, [facility] contacted for admission.    Assessment & Plan:   Principal Problem:   Multifocal pneumonia Active Problems:   Elevated troponin   Acquired hypothyroidism   Hyperlipidemia   Moderate Alzheimer dementia with agitation (HCC)   PAD (peripheral artery disease)   Chronic obstructive pulmonary disease (HCC)   Chronic kidney disease, stage 3b (HCC)   GAD (generalized anxiety disorder)   GERD without esophagitis   Prostate cancer (HCC)  # Advanced Alzheimer dementia with behavioral disturbance Several weeks worsening agitation, reduced PO. Here no apparent reversible. No apparent infection (ua unremarkable, BCs ngtd, covid/flu/rsv neg), not hypoxic, no electrolyte or renal derangement, ct head nothing acute). Discussed with wife that this appears to be end-stage dementia. After thoughtful deliberation, she elects to  pursue full comfort care. Currently in memory care but they can't care for patient given current symptom burden. Hospice engaged - inpatient hospice a possibility but again symptoms would need to be better controlled. - symptom mgmt with prn haldo, oral risperidone , prn ativan , prn morphine   # Pulmonary infiltrates No hypoxia or cough but wife reports frequent aspiration. CT chest with multiple patchy opacities, suspect microaspiration. No clinical signs pna. Started on ceftriaxone /azithromycin  on admission. Do not think bacterial pna, and regardless we have transitioned to comfort care  # Tropinemia Stable, mild. Echo with preserved EF, no RWMAs  # Hypothyroid # CKD 3a # PAD # COPD All stable   DVT prophylaxis: n/a Code Status: dnr/dni Family Communication: wife updated telephonically twice today  Level of care: Telemetry Status is: Inpatient Remains inpatient appropriate because: uncontrolled agitation    Consultants:  none  Procedures: none  Antimicrobials:  S/p ceftriaxone /azithromycin     Subjective: Asleep this morning, combative this afternoon  Objective: Vitals:   03/17/24 0700 03/17/24 0800 03/17/24 1100 03/17/24 1315  BP: (!) 152/53 (!) 138/46 (!) 147/74   Pulse: 60 (!) 54 (!) 48   Resp: 16 19 14    Temp:    97.9 F (36.6 C)  TempSrc:    Axillary  SpO2: 100% 100% 100%   Weight:      Height:        Intake/Output Summary (Last 24 hours) at 03/17/2024 1627 Last data filed at 03/16/2024 2129 Gross per 24 hour  Intake 3 ml  Output --  Net 3 ml   Filed Weights   03/16/24 1144  Weight: 73 kg    Examination:  General exam: chronically ill, malnourished Respiratory  system: normal wob Cardiovascular system: S1 & S2 heard, RRR.   Gastrointestinal system: Abdomen is nondistended, soft and nontender.   Central nervous system: Asleep Extremities: warm, no edema Skin: No visible rashes, lesions or ulcers Psychiatry: asleep at time of exam    Data  Reviewed: I have personally reviewed following labs and imaging studies  CBC: Recent Labs  Lab 03/16/24 1200 03/17/24 0429  WBC 8.0 7.6  HGB 11.7* 11.3*  HCT 35.6* 33.8*  MCV 95.2 94.2  PLT 285 290   Basic Metabolic Panel: Recent Labs  Lab 03/16/24 1200 03/16/24 1405 03/17/24 0429  NA 141  --  143  K 5.0  --  4.5  CL 103  --  108  CO2 27  --  24  GLUCOSE 121*  --  94  BUN 26*  --  21  CREATININE 1.31*  --  1.25*  CALCIUM  9.2  --  9.2  MG  --  2.1  --    GFR: Estimated Creatinine Clearance: 38.9 mL/min (A) (by C-G formula based on SCr of 1.25 mg/dL (H)). Liver Function Tests: No results for input(s): AST, ALT, ALKPHOS, BILITOT, PROT, ALBUMIN  in the last 168 hours. No results for input(s): LIPASE, AMYLASE in the last 168 hours. No results for input(s): AMMONIA in the last 168 hours. Coagulation Profile: No results for input(s): INR, PROTIME in the last 168 hours. Cardiac Enzymes: No results for input(s): CKTOTAL, CKMB, CKMBINDEX, TROPONINI in the last 168 hours. BNP (last 3 results) No results for input(s): PROBNP in the last 8760 hours. HbA1C: No results for input(s): HGBA1C in the last 72 hours. CBG: Recent Labs  Lab 03/17/24 1400 03/17/24 1434  GLUCAP 61* 139*   Lipid Profile: No results for input(s): CHOL, HDL, LDLCALC, TRIG, CHOLHDL, LDLDIRECT in the last 72 hours. Thyroid  Function Tests: No results for input(s): TSH, T4TOTAL, FREET4, T3FREE, THYROIDAB in the last 72 hours. Anemia Panel: No results for input(s): VITAMINB12, FOLATE, FERRITIN, TIBC, IRON, RETICCTPCT in the last 72 hours. Urine analysis:    Component Value Date/Time   COLORURINE YELLOW (A) 03/16/2024 1350   APPEARANCEUR CLEAR (A) 03/16/2024 1350   LABSPEC 1.012 03/16/2024 1350   PHURINE 6.0 03/16/2024 1350   GLUCOSEU NEGATIVE 03/16/2024 1350   HGBUR NEGATIVE 03/16/2024 1350   BILIRUBINUR NEGATIVE 03/16/2024 1350    KETONESUR NEGATIVE 03/16/2024 1350   PROTEINUR NEGATIVE 03/16/2024 1350   NITRITE NEGATIVE 03/16/2024 1350   LEUKOCYTESUR NEGATIVE 03/16/2024 1350   Sepsis Labs: @LABRCNTIP (procalcitonin:4,lacticidven:4)  ) Recent Results (from the past 240 hours)  Resp panel by RT-PCR (RSV, Flu A&B, Covid) Anterior Nasal Swab     Status: None   Collection Time: 03/16/24 12:57 PM   Specimen: Anterior Nasal Swab  Result Value Ref Range Status   SARS Coronavirus 2 by RT PCR NEGATIVE NEGATIVE Final    Comment: (NOTE) SARS-CoV-2 target nucleic acids are NOT DETECTED.  The SARS-CoV-2 RNA is generally detectable in upper respiratory specimens during the acute phase of infection. The lowest concentration of SARS-CoV-2 viral copies this assay can detect is 138 copies/mL. A negative result does not preclude SARS-Cov-2 infection and should not be used as the sole basis for treatment or other patient management decisions. A negative result may occur with  improper specimen collection/handling, submission of specimen other than nasopharyngeal swab, presence of viral mutation(s) within the areas targeted by this assay, and inadequate number of viral copies(<138 copies/mL). A negative result must be combined with clinical observations, patient history, and epidemiological information.  The expected result is Negative.  Fact Sheet for Patients:  bloggercourse.com  Fact Sheet for Healthcare Providers:  seriousbroker.it  This test is no t yet approved or cleared by the [place]  FDA and  has been authorized for detection and/or diagnosis of SARS-CoV-2 by FDA under an Emergency Use Authorization (EUA). This EUA will remain  in effect (meaning this test can be used) for the duration of the COVID-19 declaration under Section 564(b)(1) of the Act, 21 U.S.C.section 360bbb-3(b)(1), unless the authorization is terminated  or revoked sooner.       Influenza A  by PCR NEGATIVE NEGATIVE Final   Influenza B by PCR NEGATIVE NEGATIVE Final    Comment: (NOTE) The Xpert Xpress SARS-CoV-2/FLU/RSV plus assay is intended as an aid in the diagnosis of influenza from Nasopharyngeal swab specimens and should not be used as a sole basis for treatment. Nasal washings and aspirates are unacceptable for Xpert Xpress SARS-CoV-2/FLU/RSV testing.  Fact Sheet for Patients: bloggercourse.com  Fact Sheet for Healthcare Providers: seriousbroker.it  This test is not yet approved or cleared by the [place]  FDA and has been authorized for detection and/or diagnosis of SARS-CoV-2 by FDA under an Emergency Use Authorization (EUA). This EUA will remain in effect (meaning this test can be used) for the duration of the COVID-19 declaration under Section 564(b)(1) of the Act, 21 U.S.C. section 360bbb-3(b)(1), unless the authorization is terminated or revoked.     Resp Syncytial Virus by PCR NEGATIVE NEGATIVE Final    Comment: (NOTE) Fact Sheet for Patients: bloggercourse.com  Fact Sheet for Healthcare Providers: seriousbroker.it  This test is not yet approved or cleared by the [place]  FDA and has been authorized for detection and/or diagnosis of SARS-CoV-2 by FDA under an Emergency Use Authorization (EUA). This EUA will remain in effect (meaning this test can be used) for the duration of the COVID-19 declaration under Section 564(b)(1) of the Act, 21 U.S.C. section 360bbb-3(b)(1), unless the authorization is terminated or revoked.  Performed at [facility], [address] [place], [place] [address]   Blood Culture (routine x 2)     Status: None (Preliminary result)   Collection Time: 03/16/24  3:58 PM   Specimen: BLOOD  Result Value Ref Range Status   Specimen Description BLOOD RIGHT ANTECUBITAL  Final   Special Requests   Final     BOTTLES DRAWN AEROBIC AND ANAEROBIC Blood Culture adequate volume   Culture   Final    NO GROWTH < 24 HOURS Performed at [facility], [address] [place], [place] [address]    Report Status PENDING  Incomplete  Blood Culture (routine x 2)     Status: None (Preliminary result)   Collection Time: 03/16/24  3:58 PM   Specimen: BLOOD  Result Value Ref Range Status   Specimen Description BLOOD BLOOD LEFT HAND  Final   Special Requests   Final    BOTTLES DRAWN AEROBIC AND ANAEROBIC Blood Culture results may not be optimal due to an inadequate volume of blood received in culture bottles   Culture   Final    NO GROWTH < 24 HOURS Performed at [facility], [address] [place], [place] [address]    Report Status PENDING  Incomplete         Radiology Studies: ECHOCARDIOGRAM COMPLETE Result Date: 03/17/2024    ECHOCARDIOGRAM REPORT   Patient Name:   RUDDY SWIRE Rylander Date of Exam: 03/17/2024 Medical Rec #:  [id number]  Height:       76.0 in Accession #:    [id number]    Weight:       160.9 lb Date of Birth:  15-Jul-1929     BSA:          2.020 m Patient Age:    89 years      BP:           138/46 mmHg Patient Gender: M             HR:           61 bpm. Exam Location:  [facility] Procedure: 2D Echo, Cardiac Doppler and Color Doppler (Both Spectral and Color            Flow Doppler were utilized during procedure). Indications:     Elevated Troponin  History:         Patient has prior history of Echocardiogram examinations, most                  recent 09/05/2022.  Sonographer:     Rosina Dunk Referring Phys:  [id number] MORENE BATHE Diagnosing Phys: Deatrice Cage MD  Sonographer Comments: Technically difficult study due to poor echo windows and suboptimal parasternal window. Image acquisition challenging due to uncooperative patient. IMPRESSIONS  1. Left ventricular ejection fraction, by estimation, is 60 to 65%. The left ventricle has normal function. The left ventricle has no  regional wall motion abnormalities. Left ventricular diastolic parameters are indeterminate.  2. Right ventricular systolic function is normal. The right ventricular size is normal.  3. The mitral valve is normal in structure. No evidence of mitral valve regurgitation. No evidence of mitral stenosis.  4. The aortic valve is calcified. Aortic valve regurgitation is not visualized. Aortic valve sclerosis/calcification is present, without any evidence of aortic stenosis.  5. The inferior vena cava is normal in size with greater than 50% respiratory variability, suggesting right atrial pressure of 3 mmHg.  6. Technically difficult study due to poor echo windows FINDINGS  Left Ventricle: Left ventricular ejection fraction, by estimation, is 60 to 65%. The left ventricle has normal function. The left ventricle has no regional wall motion abnormalities. The left ventricular internal cavity size was normal in size. There is  no left ventricular hypertrophy. Left ventricular diastolic parameters are indeterminate. Right Ventricle: The right ventricular size is normal. No increase in right ventricular wall thickness. Right ventricular systolic function is normal. Left Atrium: Left atrial size was normal in size. Right Atrium: Right atrial size was normal in size. Pericardium: There is no evidence of pericardial effusion. Mitral Valve: The mitral valve is normal in structure. No evidence of mitral valve regurgitation. No evidence of mitral valve stenosis. MV peak gradient, 4.7 mmHg. The mean mitral valve gradient is 2.0 mmHg. Tricuspid Valve: The tricuspid valve is normal in structure. Tricuspid valve regurgitation is not demonstrated. No evidence of tricuspid stenosis. Aortic Valve: The aortic valve is calcified. Aortic valve regurgitation is not visualized. Aortic valve sclerosis/calcification is present, without any evidence of aortic stenosis. Aortic valve mean gradient measures 3.0 mmHg. Aortic valve peak gradient  measures 5.0 mmHg. Aortic valve area, by VTI measures 2.65 cm. Pulmonic Valve: The pulmonic valve was normal in structure. Pulmonic valve regurgitation is not visualized. No evidence of pulmonic stenosis. Aorta: The aortic root is normal in size and structure. Venous: The inferior vena cava is normal in size with greater than 50% respiratory variability, suggesting right atrial pressure of 3 mmHg. IAS/Shunts: No atrial  level shunt detected by color flow Doppler.  LEFT VENTRICLE PLAX 2D LVIDd:         4.00 cm     Diastology LVIDs:         2.30 cm     LV e' medial:    4.79 cm/s LV PW:         1.00 cm     LV E/e' medial:  20.1 LV IVS:        1.00 cm     LV e' lateral:   5.44 cm/s LVOT diam:     1.90 cm     LV E/e' lateral: 17.7 LV SV:         77 LV SV Index:   38 LVOT Area:     2.84 cm LV IVRT:       102 msec  LV Volumes (MOD) LV vol d, MOD A2C: 50.2 ml LV vol d, MOD A4C: 38.2 ml LV vol s, MOD A2C: 26.8 ml LV vol s, MOD A4C: 17.4 ml LV SV MOD A2C:     23.4 ml LV SV MOD A4C:     38.2 ml LV SV MOD BP:      22.4 ml RIGHT VENTRICLE            IVC RV Basal diam:  3.30 cm    IVC diam: 1.70 cm RV S prime:     3.44 cm/s LEFT ATRIUM             Index        RIGHT ATRIUM           Index LA diam:        2.80 cm 1.39 cm/m   RA Area:     10.20 cm LA Vol (A2C):   27.1 ml 13.42 ml/m  RA Volume:   17.80 ml  8.81 ml/m LA Vol (A4C):   34.8 ml 17.23 ml/m LA Biplane Vol: 30.9 ml 15.30 ml/m  AORTIC VALVE AV Area (Vmax):    2.63 cm AV Area (Vmean):   2.61 cm AV Area (VTI):     2.65 cm AV Vmax:           112.00 cm/s AV Vmean:          75.600 cm/s AV VTI:            0.290 m AV Peak Grad:      5.0 mmHg AV Mean Grad:      3.0 mmHg LVOT Vmax:         104.00 cm/s LVOT Vmean:        69.700 cm/s LVOT VTI:          0.271 m LVOT/AV VTI ratio: 0.93  AORTA Ao Root diam: 3.70 cm MITRAL VALVE MV Area (PHT): 3.31 cm    SHUNTS MV Area VTI:   1.91 cm    Systemic VTI:  0.27 m MV Peak grad:  4.7 mmHg    Systemic Diam: 1.90 cm MV Mean grad:  2.0  mmHg MV Vmax:       1.08 m/s MV Vmean:      67.5 cm/s MV Decel Time: 229 msec MV E velocity: 96.40 cm/s MV A velocity: 93.30 cm/s MV E/A ratio:  1.03 Deatrice Cage MD Electronically signed by Deatrice Cage MD Signature Date/Time: 03/17/2024/1:34:10 PM    Final    DG Toe 2nd Right Result Date: 03/16/2024 EXAM: 1 VIEW(S) XRAY OF THE RIGHT TOES 03/16/2024 08:13:47 PM COMPARISON: Foot series earlier  today. CLINICAL HISTORY: [id number] Fracture [id number] Fracture [id number]. FINDINGS: BONES AND JOINTS: No fracture, subluxation, or dislocation. Specifically, no fracture within the second proximal phalanx as questioned on prior foot series. SOFT TISSUES: The soft tissues are unremarkable. IMPRESSION: 1. No acute pony abnormality. Electronically signed by: Franky Crease MD 03/16/2024 08:20 PM EST RP Workstation: [id number]   CT Chest Wo Contrast Result Date: 03/16/2024 CLINICAL DATA:  Pneumonia EXAM: CT CHEST WITHOUT CONTRAST TECHNIQUE: Multidetector CT imaging of the chest was performed following the standard protocol without IV contrast. RADIATION DOSE REDUCTION: This exam was performed according to the departmental dose-optimization program which includes automated exposure control, adjustment of the mA and/or kV according to patient size and/or use of iterative reconstruction technique. COMPARISON:  April 19, 2021 FINDINGS: Cardiovascular: Aortic atherosclerosis. Normal cardiac size. No pericardial effusion. Extensive coronary artery calcifications are noted. Mediastinum/Nodes: No enlarged mediastinal or axillary lymph nodes. Thyroid  gland, trachea, and esophagus demonstrate no significant findings. Lungs/Pleura: No pneumothorax or pleural effusion is noted. Interval development of multiple patchy opacities seen in the left upper and predominantly left lower lobes, concerning for multifocal pneumonia. Minimal right posterior basilar subsegmental atelectasis is noted. Upper Abdomen: Cholelithiasis. Musculoskeletal: No chest wall  mass or suspicious bone lesions identified. IMPRESSION: 1. Interval development of multiple patchy opacities seen in the left upper and predominantly left lower lobes, concerning for multifocal pneumonia. 2. Extensive coronary artery calcifications are noted suggesting coronary artery disease. 3. Cholelithiasis. 4. Aortic atherosclerosis. Aortic Atherosclerosis (ICD10-I70.0). Electronically Signed   By: Lynwood Landy Raddle M.D.   On: 03/16/2024 15:10   DG Chest Portable 1 View Result Date: 03/16/2024 CLINICAL DATA:  elevated trop, eval for thoracic process, agitation/change in dementia pattern EXAM: PORTABLE CHEST 1 VIEW COMPARISON:  April 19, 2021 FINDINGS: Evaluation is limited by rotation. Question increased nodular fullness along the RIGHT perihilar border, possibly due to rotation. Cardiac silhouette is otherwise similar compared to prior given differences in technique. No pleural effusion or pneumothorax. Patchy bibasilar perihilar predominant reticulonodular opacities. IMPRESSION: 1. Patchy bibasilar perihilar predominant opacities could reflect infection, aspiration or atelectasis. 2. Rounded opacity along the RIGHT perihilar border, possibly summation artifact due to patient rotation but technically indeterminate. Recommend repeat PA and lateral chest radiograph after resolution of acute symptomatology for improved comparison. Electronically Signed   By: Corean Salter M.D.   On: 03/16/2024 13:15   CT Head Wo Contrast Result Date: 03/16/2024 EXAM: CT HEAD WITHOUT CONTRAST 03/16/2024 12:21:00 PM TECHNIQUE: CT of the head was performed without the administration of intravenous contrast. Automated exposure control, iterative reconstruction, and/or weight based adjustment of the mA/kV was utilized to reduce the radiation dose to as low as reasonably achievable. COMPARISON: CT of the head dated 09/18/2022. CLINICAL HISTORY: Delirium; Dementia with behavioral disturbance. May have had a fall, grossly  evaluate for intracranial trauma. FINDINGS: BRAIN AND VENTRICLES: No acute hemorrhage. No evidence of acute infarct. Nonspecific periventricular and subcortical white matter hypoattenuation, favored to reflect chronic microvascular ischemic changes. Generalized volume loss. No hydrocephalus. No extra-axial collection. No mass effect or midline shift. Atherosclerotic calcifications of carotid siphons and intracranial vertebral arteries. ORBITS: Bilateral lens replacement. SINUSES: No acute abnormality. SOFT TISSUES AND SKULL: No acute soft tissue abnormality. No skull fracture. IMPRESSION: 1. No acute intracranial abnormality, similar to 09/18/22. 2. Nonspecific periventricular and subcortical white matter hypoattenuation, favored to reflect chronic microvascular ischemic changes. Electronically signed by: Evalene Coho MD 03/16/2024 12:26 PM EST RP Workstation: [id number]   DG Foot 2 Views Right Result Date: 03/16/2024  EXAM: 2 VIEW(S) XRAY OF THE RIGHT FOOT 03/16/2024 12:12:00 PM COMPARISON: None available. CLINICAL HISTORY: fall, ? injury to R 2nd toe FINDINGS: BONES AND JOINTS: Possible nondisplaced fracture of the distal aspect of the proximal phalanx of the second toe. Difficult to confirm on lateral imaging due to the overlap of bony structures. Consider dedicated imaging of the second toe, ideally with 3 views excluding adjacent overlapping bony structures. Plantar heel spur. Degenerative changes of the distal interphalangeal joints. No malalignment. SOFT TISSUES: Atherosclerotic vascular calcifications. IMPRESSION: 1. Possible nondisplaced fracture of the distal aspect of the proximal phalanx of the second toe, difficult to confirm on lateral imaging due to overlap of bony structures; differential considerations include projectional artifact or a nutrient canal. 2. Recommend dedicated 3-view radiographs of the second toe to further evaluate; if initial dedicated radiographs are negative but symptoms  persist, consider repeat radiographs in 7-10 days or cross-sectional imaging (CT or MRI) for occult fracture. Electronically signed by: Waddell Calk MD 03/16/2024 12:23 PM EST RP Workstation: [id number]        Scheduled Meds:  heparin   5,000 Units Subcutaneous Q8H   risperiDONE   0.5 mg Oral BID   sodium chloride  flush  3 mL Intravenous Q12H   Continuous Infusions:   LOS: 0 days     Devaughn KATHEE Ban, MD [facility]   If 7PM-7AM, please contact night-coverage [email or web address] Password [facility] 03/17/2024, 4:27 PM     "

## 2024-03-17 NOTE — ED Notes (Signed)
 Pt continuing to pull at lines and attempt to get out of the bed. Sitter at bedside.

## 2024-03-17 NOTE — Progress Notes (Addendum)
 [facility] Liaison Note  Referral received today from ICM, Nathanael Ring, RN, for hospice follow up at [facility].    Referral submitted today. Facility will provide all DME needs.  Please call with any hospice related questions or concerns.  Thank you for the opportunity to participate in this patient's care  [facility] Liaison [phone]

## 2024-03-17 NOTE — Consult Note (Signed)
 [place] Psychiatric Consult Follow-up  Patient Name: .Theodore [name]  MRN: [id number]  DOB: Mar 23, 1929  Consult Order details:  Orders (From admission, onward)     Start     Ordered   03/16/24 1206  CONSULT TO CALL ACT TEAM       Ordering Provider: Dicky Anes, MD  Provider:  (Not yet assigned)  Question:  Reason for Consult?  Answer:  psych consult   03/16/24 1205   03/16/24 1205  IP CONSULT TO PSYCHIATRY       Comments: Increasing agitation, combativeness. Refusing help from staff, more paranoid recently. Wife discussed with me and concerns for agressiveness, more than typical. Escalating agitated behaviors.  Ordering Provider: Dicky Anes, MD  Provider:  (Not yet assigned)  Question:  Reason for consult:  Answer:  Medication management   03/16/24 1204             Mode of Visit: In person    Psychiatry Consult Evaluation  Service Date: March 17, 2024 LOS:  LOS: 0 days  Chief Complaint Aggressive behaviors  Primary Psychiatric Diagnoses   Moderate Alzheimer dementia with agitation [facility])   Assessment   Patient was seen on rounds today by psychiatry. Patient was noted to be sleeping in bed at the time of rounds. Given previous documented aggressive behaviors in the chart, this provider opted not to awaken patient in order to prevent potential agitation and escalation of behaviors.  Per chart review, patient has demonstrated increasing aggressive behaviors at the facility and is refusing to take his medications. Patient has a documented history of dementia per chart review. Previous psychiatrist who assessed patient had added scheduled Geodon  to the medication regimen. However, patient continues to refuse oral medications at this time, which presents significant difficulties with medication adherence and implementing the treatment plan. Of note, patient was also prescribed Rexulti . However, due to the ongoing medication refusal, adherence to this medication has also been  problematic.  Recommendations were discussed with the primary medical team regarding potentially switching to risperidone  oral solution 0.5 mg twice daily to target patient's aggressive behaviors associated with dementia. The liquid formulation may help promote better medication adherence, as it can be mixed with food or beverages and is more difficult for patients to refuse or cheek compared to tablets. The liquid formulation also allows for more flexible dosing adjustments if needed. However, per chart review, patient's other medications are currently being held at this time pending speech-language pathology evaluation for swallowing function and aspiration risk. Further adjustments to psychiatric medications will be pending results of this evaluation to ensure safe administration route.  This provider attempted to contact patient's wife via telephone to discuss medication recommendations, risks, benefits, and side effects. However, this provider was unable to reach her at this time. Per communication with the primary medical team, they have remained in close contact with the family and have been actively discussing care recommendations with patient's wife. The primary medical team is reviewing the risks, benefits, and side effects of the psychiatric medication recommendations with wife so that the primary team can implement the recommended medication changes with family consent.  Per primary medical team, patient will be transitioning to hospice and palliative care services given his current medical condition and goals of care. The primary medical team has confirmed they are in communication with patient's wife regarding this transition and the medication recommendations. Given that patient is transitioning to hospice care focused on comfort and quality of life, and the primary medical team  has obtained appropriate consent from wife for medication changes, the initiation of risperidone  oral solution as  recommended is appropriate at this time.  Psychiatry will continue to follow along and work collaboratively with the primary medical team to establish an appropriate medication regimen to target patient's increasing aggressive behaviors while maintaining comfort and quality of life as patient transitions to hospice care. Psychiatry remains available for consultation and medication adjustments as needed based on patient's response and clinical status.    Diagnoses:  Active Hospital problems: Principal Problem:   Multifocal pneumonia Active Problems:   Acquired hypothyroidism   PAD (peripheral artery disease)   Chronic obstructive pulmonary disease (HCC)   Chronic kidney disease, stage 3b (HCC)   GAD (generalized anxiety disorder)   Moderate Alzheimer dementia with agitation (HCC)   Hyperlipidemia   GERD without esophagitis   Elevated troponin    Plan   ## Psychiatric Medication Recommendations:  See above  ## Medical Decision Making Capacity: Not specifically addressed in this encounter  ## Further Work-up:   -- most recent EKG on 03/16/2024 had QtC of 454 -- Pertinent labwork reviewed earlier this admission includes: CBC, BMP, review of previous CMP's collected at on previous lab works   ## Disposition:--Per primary medical team, patient will be transitioning to hospice palliative care services  ## Behavioral / Environmental: -Delirium Precautions: Delirium Interventions for Nursing and Staff: - RN to open blinds every AM. - To Bedside: Glasses, hearing aide, and pt's own shoes. Make available to patients. when possible and encourage use. - Encourage po fluids when appropriate, keep fluids within reach. - OOB to chair with meals. - Passive ROM exercises to all extremities with AM & PM care. - RN to assess orientation to person, time and place QAM and PRN. - Recommend extended visitation hours with familiar family/friends as feasible. - Staff to minimize disturbances at night. Turn  off television when pt asleep or when not in use.    ## Safety and Observation Level:  - Based on my clinical evaluation, I estimate the patient to be at low risk of self harm in the current setting. - At this time, we recommend  routine. This decision is based on my review of the chart including patient's history and current presentation, interview of the patient, mental status examination, and consideration of suicide risk including evaluating suicidal ideation, plan, intent, suicidal or self-harm behaviors, risk factors, and protective factors. This judgment is based on our ability to directly address suicide risk, implement suicide prevention strategies, and develop a safety plan while the patient is in the clinical setting. Please contact our team if there is a concern that risk level has changed.  CSSR Risk Category:C-SSRS RISK CATEGORY: No Risk  Suicide Risk Assessment: Patient has following modifiable risk factors for suicide: N/A, which we are addressing by working with the medical team to maximize medication management for symptom control. Patient has following non-modifiable or demographic risk factors for suicide: male gender Patient has the following protective factors against suicide: Supportive family, no history of suicide attempts, and no history of NSSIB  Thank you for this consult request. Recommendations have been communicated to the primary team.  We will continue to round at this time.  Zelda Sharps, NP        History of Present Illness  Relevant Aspects of [facility]   Collected by initial provider-please reference initial note    Psychiatric and Social History  Psychiatric History:  Collected by initial provider-please reference initial note  Exam Findings  Physical Exam: Reviewed and agree with the physical exam findings conducted by the medical provider Vital Signs:  Temp:  [97.6 F (36.4 C)-98.1 F (36.7 C)] 98.1 F (36.7 C) (01/05 0151) Pulse  Rate:  [54-103] 54 (01/05 0800) Resp:  [14-27] 19 (01/05 0800) BP: (94-181)/(46-116) 138/46 (01/05 0800) SpO2:  [94 %-100 %] 100 % (01/05 0800) Weight:  [73 kg] 73 kg (01/04 1144) Blood pressure (!) 138/46, pulse (!) 54, temperature 98.1 F (36.7 C), temperature source Axillary, resp. rate 19, height 6' 4 (1.93 m), weight 73 kg, SpO2 100%. Body mass index is 19.59 kg/m.        Other History   These have been pulled in through the EMR, reviewed, and updated if appropriate.  Family History:  The patient's family history includes Cancer in his sister; Heart disease in his father and mother; Hypertension in his mother.  Medical History: Past Medical History:  Diagnosis Date   Actinic keratosis    Allergy 1990s   Penicillin   Anxiety 1946   Arthritis    knees   Asymptomatic Sinus bradycardia    a. 05/2016 Zio: Avg HR 61 (41-167).   Benign essential tremor    Cancer (HCC) 2002   melanoma right ear   Chronic kidney disease    Mild   Colon polyp    COPD (chronic obstructive pulmonary disease) (HCC)    pt said he believes it is a misdiagnosis    Coronary artery calcification seen on CT scan    a. 06/2016 CTA Chest: cor Ca2+; b. 05/2017 MV: EF >65%. No ischemia/infarct.   COVID-19 virus infection 2020/04/09   Depression 1946   Death of brother   Dysplastic nevus 04/25/2022   Left lateral neck, severe, shave removal 06/05/22   GERD (gastroesophageal reflux disease)    History of echocardiogram    a. 04/2016 Echo: EF 60-65%, mild conc LVH. Nl PASP.   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 05/2016 Zio: Avg HR 61 (41-167). 9 SVT runs  (fastest 167 - 5 beats; longest 8 beats - 101 bpm). Rare PACs/PVCs.   Pre-syncope    a. 06/2022 Zio: Predominantly sinus bradycardia with an average heart rate of 53 (40-148).  1 run of NSVT x 17 beats (143 bpm), 19 SVT runs (fastest 148 x 7 beats; longest 11 beats @ 114 bpm).  Rare PVCs/PACs. Triggered events assoc w/ sinus rhythym.   PSVT (paroxysmal supraventricular tachycardia)    a. 05/2016 Zio: 9 SVT runs; b. 06/2022 Zio: 19 SVT runs, fastest 148, longest 11 beats.   PVC's (premature ventricular contractions)    Rhinitis    Sepsis due to cellulitis (HCC) 11/28/2021   Severe sepsis (HCC) 11/28/2021   Wears dentures    partial upper and lower   Wears hearing aid in both ears     Surgical History: Past Surgical History:  Procedure Laterality Date   ADENOIDECTOMY     BRAIN SURGERY     Nonessential tremors   CATARACT EXTRACTION W/PHACO Left 10/22/2017   Procedure: CATARACT  EXTRACTION PHACO AND INTRAOCULAR LENS PLACEMENT (IOC)  LEFT;  Surgeon: Myrna Adine Anes, MD;  Location: [facility];  Service: Ophthalmology;  Laterality: Left;   CATARACT EXTRACTION W/PHACO Right 11/20/2017   Procedure: CATARACT EXTRACTION PHACO AND INTRAOCULAR LENS PLACEMENT (IOC) RIGHT;  Surgeon: Myrna Adine Anes, MD;  Location: [facility];  Service: Ophthalmology;  Laterality: Right;   ESOPHAGOGASTRODUODENOSCOPY (EGD) WITH PROPOFOL  N/A 10/31/2016   Procedure: ESOPHAGOGASTRODUODENOSCOPY (EGD) WITH PROPOFOL ;  Surgeon: Gaylyn Gladis PENNER, MD;  Location: [facility] ENDOSCOPY;  Service: Endoscopy;  Laterality: N/A;   I & D EXTREMITY Right 11/29/2021   Procedure: IRRIGATION AND DEBRIDEMENT EXTREMITY;  Surgeon: Desiderio Schanz, MD;  Location: [facility] ORS;  Service: General;  Laterality: Right;   JOINT REPLACEMENT Right 2003   knee   knee meniscus repair Right    LEG WOUND REPAIR / CLOSURE  11/2021   MELANOMA EXCISION Right    ear. Followed by Dr. Hester   PATELLECTOMY Bilateral    TONSILLECTOMY        Medications:  Current Medications[1]  Allergies: Allergies[2]  Zelda Sharps, NP This note was created using Dragon dictation software. Please excuse any inadvertent transcription errors. Case was discussed with supervising physician Dr. Jadapalle who is agreeable with current plan.       [1]  Current Facility-Administered Medications:    acetaminophen  (TYLENOL ) tablet 650 mg, 650 mg, Oral, Q6H PRN **OR** acetaminophen  (TYLENOL ) suppository 650 mg, 650 mg, Rectal, Q6H PRN, Fernand Prost, MD   azithromycin  (ZITHROMAX ) 500 mg in sodium chloride  0.9 % 250 mL IVPB, 500 mg, Intravenous, Q24H, Fernand Prost, MD   brexpiprazole  (REXULTI ) tablet 2 mg, 2 mg, Oral, Daily, Fernand Prost, MD, 2 mg at 03/16/24 2126   cefTRIAXone  (ROCEPHIN ) 2 g in sodium chloride  0.9 % 100 mL IVPB, 2 g, Intravenous, Q24H, Fernand Prost, MD   cholecalciferol  (VITAMIN D3) tablet 5,000 Units, 5,000 Units, Oral, Daily, Fernand Prost, MD   ezetimibe  (ZETIA ) tablet 10 mg, 10 mg, Oral, Daily, Fernand Prost, MD   famotidine  (PEPCID ) tablet 20 mg, 20 mg, Oral, BID, Khan, Ghalib, MD   folic acid  (FOLVITE ) tablet 1 mg, 1 mg, Oral, Daily, Fernand Prost, MD   heparin  injection 5,000 Units, 5,000 Units, Subcutaneous, Q8H, Fernand Prost, MD, 5,000 Units at 03/17/24 0604   levothyroxine  (SYNTHROID ) tablet 100 mcg, 100 mcg, Oral, Q0600, Fernand Prost, MD   ondansetron  (ZOFRAN ) tablet 4 mg, 4 mg, Oral, Q6H PRN **OR** ondansetron  (ZOFRAN ) injection 4 mg, 4 mg, Intravenous, Q6H PRN, Fernand Prost, MD   senna-docusate (Senokot-S) tablet 1 tablet, 1 tablet, Oral, QHS PRN, Fernand Prost, MD   sodium chloride  flush (NS) 0.9 % injection 3 mL, 3 mL, Intravenous, Q12H, Fernand Prost, MD, 3 mL at 03/16/24 2129   ziprasidone  (GEODON ) capsule 20 mg, 20 mg, Oral, BID WC, Madaram, Kondal R, MD, 20 mg at 03/16/24 1637  Current Outpatient Medications:    Acetaminophen  500 MG capsule, Take 2 capsules (1,000 mg total) by mouth every 12 (twelve) hours., Disp:  60 capsule, Rfl: 11   ALPRAZolam  (XANAX ) 0.5 MG tablet, Take 0.5 mg by mouth. Give 1 tablet (0.5mg ) by mouth every 24 hours as needed for increased anxiety not controlled by scheduled Xanax , Disp: , Rfl:    ALPRAZolam  (XANAX ) 1 MG tablet, Take 1 tablet (1 mg total) by mouth 2 (two) times daily. MAY give additional dose  0.5 MG daily  IF NEEDED FOR AGITATION, Disp: 75 tablet, Rfl: 5   ascorbic acid  (VITAMIN C ) 500 MG tablet, Take  500 mg by mouth daily., Disp: , Rfl:    brexpiprazole  (REXULTI ) 2 MG TABS tablet, Take 2 mg by mouth daily., Disp: , Rfl:    busPIRone  (BUSPAR ) 10 MG tablet, Take 1 tablet (10 mg total) by mouth 3 (three) times daily., Disp: 90 tablet, Rfl: 1   Cholecalciferol  (VITAMIN D3) 125 MCG (5000 UT) TABS, Take 1 tablet by mouth daily., Disp: , Rfl:    ezetimibe  (ZETIA ) 10 MG tablet, TAKE 1 TABLET BY MOUTH EVERY DAY, Disp: 90 tablet, Rfl: 2   famotidine  (PEPCID ) 20 MG tablet, Take 20 mg by mouth 2 (two) times daily., Disp: , Rfl:    fluticasone  (FLONASE ) 50 MCG/ACT nasal spray, Place 1 spray into both nostrils daily., Disp: , Rfl:    Folic Acid  (FOLATE PO), Take 680 mcg by mouth daily., Disp: , Rfl:    levothyroxine  (SYNTHROID ) 100 MCG tablet, TAKE 1 TABLET BY MOUTH EVERY DAY BEFORE BREAKFAST, Disp: 90 tablet, Rfl: 3   Lorazepam  POWD, Apply 0.5 mg topically every 6 (six) hours as needed (severe agitation)., Disp: , Rfl:    Polyethyl Glycol-Propyl Glycol (SYSTANE) 0.4-0.3 % SOLN, Apply to eye., Disp: , Rfl:    polyethylene glycol (MIRALAX  / GLYCOLAX ) 17 g packet, Take 17 g by mouth daily., Disp: , Rfl:    saccharomyces boulardii (FLORASTOR) 250 MG capsule, Take 250 mg by mouth daily., Disp: , Rfl:    senna (SENOKOT) 8.6 MG TABS tablet, Take 1 tablet by mouth at bedtime as needed for mild constipation., Disp: , Rfl:    sertraline  (ZOLOFT ) 100 MG tablet, Take 1.5 tablets (150 mg total) by mouth daily., Disp: 135 tablet, Rfl: 5   vitamin B-12 (CYANOCOBALAMIN ) 500 MCG tablet, Take 1  tablet by mouth daily., Disp: , Rfl:    brexpiprazole  (REXULTI ) 1 MG TABS tablet, Take 0.5 tablets (0.5 mg total) by mouth daily for 7 days, THEN 1 tablet (1 mg total) daily. (Patient not taking: Reported on 03/16/2024), Disp: , Rfl:    cycloSPORINE  (RESTASIS ) 0.05 % ophthalmic emulsion, Place 1 drop into both eyes 2 (two) times daily. (Patient not taking: No sig reported), Disp: , Rfl:    LORazepam  (ATIVAN ) 0.5 MG tablet, Take 1 tablet (0.5 mg total) by mouth every 6 (six) hours as needed for anxiety (Apply 1ml- 0.5 mg topically every 6 hours as needed for agitation)., Disp: 30 tablet, Rfl: 1   Probiotic Product (MISC INTESTINAL FLORA REGULAT) CAPS, Take by mouth. (Patient not taking: Reported on 03/16/2024), Disp: , Rfl:    QUEtiapine  (SEROQUEL ) 50 MG tablet, Take 1 tablet (50 mg total) by mouth at bedtime. (Patient not taking: Reported on 03/16/2024), Disp: 90 tablet, Rfl: 1 [2]  Allergies Allergen Reactions   Penicillins Rash and Other (See Comments)    Other reaction(s): Other (see comments) Other reaction(s): UNKNOWN   Benzonatate      Pruritic rash   Lexapro  [Escitalopram ] Other (See Comments)    Increased hand tremor   Molnupiravir      Pruritic rash on neck

## 2024-03-17 NOTE — ED Notes (Addendum)
 Vascular at bedside attempting to get ECHO. Pt not cooperating. Pt putting his hands over her chest and fighting with staff.

## 2024-03-17 NOTE — ED Notes (Signed)
 Pt attempting to get out of bed by sliding to the end of the bed.

## 2024-03-17 NOTE — Progress Notes (Addendum)
 SLP Cancellation Note  Patient Details Name: Theodore [name] MRN: [id number] DOB: [birth year]   Cancelled treatment:       Reason Eval/Treat Not Completed: Medical issues which prohibited therapy;Patient not medically ready (chart reviewed; consulted MD re: pt's overall status and gave recommendations d/t HIGH risk for aspiration)   Pt is a 89yo male w/ PMH including diagnoses of Malnutrition and weight loss, Falls, HOH w/ aids, GAD (generalized anxiety disorder), Dysphagia as late effect of cerebrovascular disease, Chronic kidney disease, stage 3b, COPD, and Dementia in Alzheimer's disease with depression.  ~1 year ago, he had an Outpt MBSS which revealed:  Moderate oropharyngeal phase Dysphagia in setting of his Dementia, deconditioning/weakness, and Malnutrition as well as Advance Age(89y). Pt had a Baseline of Dysphagia per chart w/ self-reported episodes of coughing when eating/drinking at home. Wife endorsed also. These issues increase risk for aspiration/aspiration pneumonia.  during the Pharyngeal phase, reduced hyolaryngeal excursion and reduced BOT contact/stripping (suspect age related impact). Epiglottic inversion appeared partial w/ decreased airway closure/protection which resulted in Inconsistent, laryngeal Penetration/Aspiration w/ thin liquids before/during the swallow; no overt laryngeal penetration nor aspiration noted w/ nectar liquids and increased textures/foods.. The recommendation was to monitor for need to transition to Nectar consistency liquids(downgrade of diet) if the swallowing strategies were not successful during swallowing and a decline in Pulmonary status occurred. (Following new strategies can be inconsistent in accuracy and reliability in setting of Cognitive decline)  He admitted to the ED on 1/4 from Memory Care at [facility] for aggressive behavior. He has been refusing all care and his medications and food and drink and requires 2+ Max assist w/ mobility/ADLs.   Per chart notes today, pt has been quite agitated this afternoon, requiring 2 IM injections of Haldol . Per discussion w/ MD re: the above and pt's current admit, MD stated pt is transitioning to Comfort status. Noted Wife is requesting Hospice care at [facility] at D/C.   MD agreed w/ SLP's recommendation of diet downgrade at this time to reduce risk for aspiration event(s) w/ strict aspiration precautions and feeding support - give po's only when fully awake and calm to be safe w/ oral intake. Diet: Dysphagia level 1 w/ Nectar liquids; 100% Supervision w/ aspiration precautions. ST services can be available for further needs and Education while pt is admitted but will follow peripherally at this time. MD agreed.       Comer Portugal, MS, CCC-SLP Speech Language Pathologist Rehab Services; [facility] [phone] (ascom) Ieshia Hatcher 03/17/2024, 3:36 PM

## 2024-03-17 NOTE — Care Management Obs Status (Signed)
 MEDICARE OBSERVATION STATUS NOTIFICATION   Patient Details  Name: Theodore [name] MRN: [id number] Date of Birth: 07-22-1929   Medicare Observation Status Notification Given:  Chaney BRANDY CHRISTIANE LELON, CMA 03/17/2024, 1:19 PM

## 2024-03-17 NOTE — ED Notes (Signed)
 Sitter at bedside at this time. Pt continues to yell and attempt to get out of the bed.

## 2024-03-17 NOTE — ED Notes (Signed)
 Pt was wet from urine. Pt's bed, brief, and gown changed. Pt pulled up in the bed. Warm blankets provided and bed alarm on.

## 2024-03-17 NOTE — Evaluation (Signed)
 Physical Therapy Evaluation Patient Details Name: Theodore [name] MRN: [id number] DOB: Apr 11, 1929 Today's Date: 03/17/2024  History of Present Illness  Pt is a 89 y.o. year old male with medical history of HTN, HLD, CKDIIIa, hypothyroidism, COPD, hx of prostate cancer, and alzheimer's disease with behavioral disturbance presenting to the ED with worsening agitation. MD assessment includes: multifocal pneumonia, elevated troponin, and R 2nd toe fracture with imaging results stating No acute bony abnormality.   Clinical Impression  Pt A&O x 0 and unable to provide any history or follow commands during the session.  Pt required +2 total assist with bed mobility tasks and once sitting at the EOB presented with heavy posterior lean/push resulting in pt requiring total assist to prevent sliding off of the EOB.  Pt will benefit from a trial of PT services while in acute care but may ultimately be appropriate for LTC without PT follow-up.          If plan is discharge home, recommend the following: Two people to help with walking and/or transfers;A lot of help with bathing/dressing/bathroom;Direct supervision/assist for medications management;Supervision due to cognitive status;Direct supervision/assist for financial management;Assist for transportation   Can travel by private vehicle   No    Equipment Recommendations Other (comment) (TBD)  Recommendations for Other Services       Functional Status Assessment Patient has had a recent decline in their functional status and/or demonstrates limited ability to make significant improvements in function in a reasonable and predictable amount of time     Precautions / Restrictions Precautions Precautions: Fall Restrictions Other Position/Activity Restrictions: h/o agitation/combative      Mobility  Bed Mobility Overal bed mobility: Needs Assistance Bed Mobility: Supine to Sit, Sit to Supine     Supine to sit: +2 for physical assistance, Total  assist Sit to supine: Total assist, +2 for physical assistance   General bed mobility comments: total assist for BLE and trunk control, pt unable to follow any commands or assist with bed mobility tasks    Transfers                   General transfer comment: Unsafe to attempt due to very poor sitting balance with heavy posterior lean/push    Ambulation/Gait                  Stairs            Wheelchair Mobility     Tilt Bed    Modified Rankin (Stroke Patients Only)       Balance Overall balance assessment: Needs assistance   Sitting balance-Leahy Scale: Poor Sitting balance - Comments: +2 assist to prevent pt from sliding off of the EOB due to heavy posterior lean/push in sitting at EOB Postural control: Posterior lean                                   Pertinent Vitals/Pain Pain Assessment Pain Assessment: PAINAD Breathing: normal Negative Vocalization: none Facial Expression: smiling or inexpressive Body Language: relaxed Consolability: no need to console PAINAD Score: 0    Home Living                     Additional Comments: Per chart review pt resides in memory care at [facility], pt unable to provide any history, A&O x 0    Prior Function Prior Level of Function : Patient poor historian/Family  not available             Mobility Comments: Unknown       Extremity/Trunk Assessment   Upper Extremity Assessment Upper Extremity Assessment: Difficult to assess due to impaired cognition    Lower Extremity Assessment Lower Extremity Assessment: Difficult to assess due to impaired cognition       Communication   Communication Communication: Impaired Factors Affecting Communication: Difficulty expressing self    Cognition Arousal: Alert Behavior During Therapy: Flat affect, Impulsive   PT - Cognitive impairments: History of cognitive impairments, No family/caregiver present to determine baseline                        PT - Cognition Comments: Pt A&O x 0, when educated pt that his name was Theodore [name] pt stated Theodore [name] Following commands: Impaired       Cueing Cueing Techniques: Tactile cues, Verbal cues     General Comments      Exercises     Assessment/Plan    PT Assessment Patient needs continued PT services  PT Problem List Decreased balance;Decreased mobility;Decreased strength;Decreased safety awareness       PT Treatment Interventions DME instruction;Gait training;Functional mobility training;Therapeutic activities;Therapeutic exercise;Balance training;Patient/family education    PT Goals (Current goals can be found in the Care Plan section)  Acute Rehab PT Goals PT Goal Formulation: Patient unable to participate in goal setting Time For Goal Achievement: 03/30/24 Potential to Achieve Goals: Fair    Frequency Min 1X/week     Co-evaluation               AM-PAC PT 6 Clicks Mobility  Outcome Measure Help needed turning from your back to your side while in a flat bed without using bedrails?: Total Help needed moving from lying on your back to sitting on the side of a flat bed without using bedrails?: Total Help needed moving to and from a bed to a chair (including a wheelchair)?: Total Help needed standing up from a chair using your arms (e.g., wheelchair or bedside chair)?: Total Help needed to walk in hospital room?: Total Help needed climbing 3-5 steps with a railing? : Total 6 Click Score: 6    End of Session Equipment Utilized During Treatment: Gait belt Activity Tolerance: Patient tolerated treatment well Patient left: in bed;with call bell/phone within reach;with bed alarm set;Other (comment) (safety mittens donned) Nurse Communication: Mobility status;Other (comment) (per nursing ok to leave wrist restraints off at end of session) PT Visit Diagnosis: Difficulty in walking, not elsewhere classified (R26.2);Muscle weakness (generalized)  (M62.81)    Time: 9082-9058 PT Time Calculation (min) (ACUTE ONLY): 24 min   Charges:   PT Evaluation $PT Eval Low Complexity: 1 Low   PT General Charges $$ ACUTE PT VISIT: 1 Visit    D. Scott Rut Betterton PT, DPT 03/17/2024, 10:03 AM

## 2024-03-17 NOTE — ED Notes (Signed)
 Fall mats placed on the floor at patients bedside. Pt continuing to throw his legs over the bed rails. Pt rocking back and fourth to try and sit up and scoot out of the bed.

## 2024-03-17 NOTE — ED Notes (Signed)
Pt refusing all PO medications.

## 2024-03-17 NOTE — ED Notes (Signed)
 Socks placed back on pt. RN placed urinal so pt could void. Pt did not.

## 2024-03-17 NOTE — ED Notes (Signed)
 Pt continuing to pull at lines and attempt to get out of bed and remove the restraints. PT jerking his arm up to attempt to break the restraint loose.

## 2024-03-17 NOTE — NC FL2 (Signed)
 " [place]  MEDICAID FL2 LEVEL OF CARE FORM     IDENTIFICATION  Patient Name: Theodore [name] Birthdate: [birth year] Sex: male Admission Date (Current Location): 03/16/2024  [facility] and [place] Number:  Chiropodist and Address:  [facility], [address], [place], [place] [address]      Provider Number: [id number]  Attending Physician Name and Address:  Kandis Devaughn Sayres, MD  Relative Name and Phone Number:  Candice Lunney- wife- [phone]    Current Level of Care: Hospital Recommended Level of Care: Skilled Nursing Facility Prior Approval Number:    Date Approved/Denied:   PASRR Number: [id number]  Discharge Plan: SNF (with hospice)    Current Diagnoses: Patient Active Problem List   Diagnosis Date Noted   Elevated troponin 03/16/2024   Multifocal pneumonia 03/16/2024   Prostate cancer (HCC) 12/27/2023   Adult failure to thrive syndrome 12/27/2023   DNR (do not resuscitate) discussion 10/18/2023   BPPV (benign paroxysmal positional vertigo) 06/11/2023   Secondary hyperparathyroidism of renal origin 10/25/2022   Dysphagia 09/07/2022   Sialorrhea 02/02/2022   Protein-calorie malnutrition, moderate 11/29/2021   Moderate Alzheimer dementia with agitation (HCC) 11/28/2021   Hyperlipidemia 11/28/2021   GERD without esophagitis 11/28/2021   Speech disturbance 08/26/2021   Lumbar radiculopathy 04/25/2021   Thoracic aortic atherosclerosis 01/17/2021   GAD (generalized anxiety disorder) 12/07/2020   Elevated PSA, between 10 and less than 20 ng/ml 11/30/2020   Chronic kidney disease, stage 3b (HCC) 11/10/2020   Benign hypertensive kidney disease with chronic kidney disease 11/10/2020   Acquired trigger finger of left middle finger 08/07/2019   Carpal tunnel syndrome of right wrist 07/10/2019   Ulnar neuropathy 06/05/2019   Localized, secondary osteoarthritis of the shoulder region 01/29/2019   Osteoarthritis of knee 01/29/2019    Chronic obstructive pulmonary disease (HCC) 09/24/2018   Cervical radiculopathy 08/14/2018   PAD (peripheral artery disease)    Acquired hypothyroidism 07/04/2014   Hypertrophy of prostate with urinary obstruction and other lower urinary tract symptoms (LUTS) 08/08/2013   Low serum testosterone  level 04/28/2013   Actinic keratosis 05/10/2010   History of malignant melanoma of skin 05/10/2010    Orientation RESPIRATION BLADDER Height & Weight        Normal Incontinent Weight: 73 kg Height:  6' 4 (193 cm)  BEHAVIORAL SYMPTOMS/MOOD NEUROLOGICAL BOWEL NUTRITION STATUS  Other (Comment) (history of agressive behavior)   Incontinent Diet (comfort)  AMBULATORY STATUS COMMUNICATION OF NEEDS Skin   Total Care Verbally Normal                       Personal Care Assistance Level of Assistance  Total care       Total Care Assistance: Maximum assistance   Functional Limitations Info             SPECIAL CARE FACTORS FREQUENCY                       Contractures Contractures Info: Not present    Additional Factors Info  Code Status Code Status Info: DNR             Current Medications (03/17/2024):  This is the current hospital active medication list Current Facility-Administered Medications  Medication Dose Route Frequency Provider Last Rate Last Admin   acetaminophen  (TYLENOL ) tablet 650 mg  650 mg Oral Q6H PRN Fernand Prost, MD       Or   acetaminophen  (TYLENOL ) suppository 650 mg  650 mg Rectal Q6H PRN Fernand Prost, MD       azithromycin  (ZITHROMAX ) 500 mg in sodium chloride  0.9 % 250 mL IVPB  500 mg Intravenous Q24H Khan, Ghalib, MD       brexpiprazole  (REXULTI ) tablet 2 mg  2 mg Oral Daily Khan, Ghalib, MD   2 mg at 03/16/24 2126   cefTRIAXone  (ROCEPHIN ) 2 g in sodium chloride  0.9 % 100 mL IVPB  2 g Intravenous Q24H Khan, Ghalib, MD       cholecalciferol  (VITAMIN D3) tablet 5,000 Units  5,000 Units Oral Daily Fernand Prost, MD       ezetimibe  (ZETIA ) tablet 10  mg  10 mg Oral Daily Fernand Prost, MD       famotidine  (PEPCID ) tablet 20 mg  20 mg Oral BID Fernand Prost, MD       folic acid  (FOLVITE ) tablet 1 mg  1 mg Oral Daily Fernand Prost, MD       heparin  injection 5,000 Units  5,000 Units Subcutaneous Q8H Khan, Ghalib, MD   5,000 Units at 03/17/24 9395   levothyroxine  (SYNTHROID ) tablet 100 mcg  100 mcg Oral Q0600 Fernand Prost, MD       ondansetron  (ZOFRAN ) tablet 4 mg  4 mg Oral Q6H PRN Fernand Prost, MD       Or   ondansetron  (ZOFRAN ) injection 4 mg  4 mg Intravenous Q6H PRN Khan, Ghalib, MD       senna-docusate (Senokot-S) tablet 1 tablet  1 tablet Oral QHS PRN Fernand Prost, MD       sodium chloride  flush (NS) 0.9 % injection 3 mL  3 mL Intravenous Q12H Khan, Ghalib, MD   3 mL at 03/16/24 2129   ziprasidone  (GEODON ) capsule 20 mg  20 mg Oral BID WC Madaram, Kondal R, MD   20 mg at 03/16/24 1637   Current Outpatient Medications  Medication Sig Dispense Refill   Acetaminophen  500 MG capsule Take 2 capsules (1,000 mg total) by mouth every 12 (twelve) hours. 60 capsule 11   ALPRAZolam  (XANAX ) 0.5 MG tablet Take 0.5 mg by mouth. Give 1 tablet (0.5mg ) by mouth every 24 hours as needed for increased anxiety not controlled by scheduled Xanax      ALPRAZolam  (XANAX ) 1 MG tablet Take 1 tablet (1 mg total) by mouth 2 (two) times daily. MAY give additional dose  0.5 MG daily  IF NEEDED FOR AGITATION 75 tablet 5   ascorbic acid  (VITAMIN C ) 500 MG tablet Take 500 mg by mouth daily.     brexpiprazole  (REXULTI ) 2 MG TABS tablet Take 2 mg by mouth daily.     busPIRone  (BUSPAR ) 10 MG tablet Take 1 tablet (10 mg total) by mouth 3 (three) times daily. 90 tablet 1   Cholecalciferol  (VITAMIN D3) 125 MCG (5000 UT) TABS Take 1 tablet by mouth daily.     ezetimibe  (ZETIA ) 10 MG tablet TAKE 1 TABLET BY MOUTH EVERY DAY 90 tablet 2   famotidine  (PEPCID ) 20 MG tablet Take 20 mg by mouth 2 (two) times daily.     fluticasone  (FLONASE ) 50 MCG/ACT nasal spray Place 1 spray into  both nostrils daily.     Folic Acid  (FOLATE PO) Take 680 mcg by mouth daily.     levothyroxine  (SYNTHROID ) 100 MCG tablet TAKE 1 TABLET BY MOUTH EVERY DAY BEFORE BREAKFAST 90 tablet 3   Lorazepam  POWD Apply 0.5 mg topically every 6 (six) hours as needed (severe agitation).     Polyethyl Glycol-Propyl Glycol (SYSTANE)  0.4-0.3 % SOLN Apply to eye.     polyethylene glycol (MIRALAX  / GLYCOLAX ) 17 g packet Take 17 g by mouth daily.     saccharomyces boulardii (FLORASTOR) 250 MG capsule Take 250 mg by mouth daily.     senna (SENOKOT) 8.6 MG TABS tablet Take 1 tablet by mouth at bedtime as needed for mild constipation.     sertraline  (ZOLOFT ) 100 MG tablet Take 1.5 tablets (150 mg total) by mouth daily. 135 tablet 5   vitamin B-12 (CYANOCOBALAMIN ) 500 MCG tablet Take 1 tablet by mouth daily.     brexpiprazole  (REXULTI ) 1 MG TABS tablet Take 0.5 tablets (0.5 mg total) by mouth daily for 7 days, THEN 1 tablet (1 mg total) daily. (Patient not taking: Reported on 03/16/2024)     cycloSPORINE  (RESTASIS ) 0.05 % ophthalmic emulsion Place 1 drop into both eyes 2 (two) times daily. (Patient not taking: No sig reported)     LORazepam  (ATIVAN ) 0.5 MG tablet Take 1 tablet (0.5 mg total) by mouth every 6 (six) hours as needed for anxiety (Apply 1ml- 0.5 mg topically every 6 hours as needed for agitation). 30 tablet 1   Probiotic Product (MISC INTESTINAL FLORA REGULAT) CAPS Take by mouth. (Patient not taking: Reported on 03/16/2024)     QUEtiapine  (SEROQUEL ) 50 MG tablet Take 1 tablet (50 mg total) by mouth at bedtime. (Patient not taking: Reported on 03/16/2024) 90 tablet 1     Discharge Medications: Please see discharge summary for a list of discharge medications.  Relevant Imaging Results:  Relevant Lab Results:   Additional Information SS# [id number]  Nathanael CHRISTELLA Ring, RN     "

## 2024-03-17 NOTE — ED Notes (Signed)
 Pt climbing over the bed rails and pulling at lines. MD messaged.

## 2024-03-17 NOTE — TOC Initial Note (Signed)
 Transition of Care [facility]) - Initial/Assessment Note    Patient Details  Name: Theodore [name] MRN: [id number] Date of Birth: 15-May-1929  Transition of Care [facility]) CM/SW Contact:    Nathanael CHRISTELLA Ring, RN Phone Number: 03/17/2024, 3:18 PM  Clinical Narrative:                 Patient placed under observation for pneumonia, he came in from Memory care at [facility] for aggressive behavior.  Seen by Behavioral health while in the ED and they have made some medication recommendations.  Wife agrees with Hospice and chooses [facility].  He is not a candidate for rehab, he is refusing all care and his medications and food and drink.  He was 2+ max assist for all mobility with PT today.  Reached out to [facility] about LTC with Hospice.  Also reached out to [facility] with [facility] he accepted the referral.  Will reach out to Alfon[facility] at [facility] when patient is ready to discharge it sounds like they want to come and evaluate him before letting him go over.  He has been quite agitated this afternoon, requiring 2 IM injections of Haldol .    Expected Discharge Plan: Skilled Nursing Facility Barriers to Discharge: Other (must enter comment)   Patient Goals and CMS Choice Patient states their goals for this hospitalization and ongoing recovery are:: Wife would like hospice care at [facility]          Expected Discharge Plan and Services   Discharge Planning Services: CM Consult   Living arrangements for the past 2 months: Assisted Living Facility                 DME Arranged: N/A         HH Arranged: NA          Prior Living Arrangements/Services Living arrangements for the past 2 months: Assisted Living Facility Lives with:: Facility Resident Patient language and need for interpreter reviewed:: Yes        Need for Family Participation in Patient Care: Yes (Comment) Care giver support system in place?: Yes (comment)   Criminal Activity/Legal Involvement Pertinent to Current  Situation/Hospitalization: No - Comment as needed  Activities of Daily Living      Permission Sought/Granted Permission sought to share information with : Facility Medical Sales Representative, Family Supports Permission granted to share information with : Yes, Verbal Permission Granted  Share Information with NAME: [name] Dchmidt  Permission granted to share info w AGENCY: [facility]  Permission granted to share info w Relationship: spouse  Permission granted to share info w Contact Information: [phone]  Emotional Assessment Appearance:: Appears stated age Attitude/Demeanor/Rapport: Unable to Assess Affect (typically observed): Unable to Assess   Alcohol  / Substance Use: Not Applicable Psych Involvement: Yes (comment)  Admission diagnosis:  Elevated troponin [R79.89] Patient Active Problem List   Diagnosis Date Noted   Elevated troponin 03/16/2024   Multifocal pneumonia 03/16/2024   Prostate cancer (HCC) 12/27/2023   Adult failure to thrive syndrome 12/27/2023   DNR (do not resuscitate) discussion 10/18/2023   BPPV (benign paroxysmal positional vertigo) 06/11/2023   Secondary hyperparathyroidism of renal origin 10/25/2022   Dysphagia 09/07/2022   Sialorrhea 02/02/2022   Protein-calorie malnutrition, moderate 11/29/2021   Moderate Alzheimer dementia with agitation (HCC) 11/28/2021   Hyperlipidemia 11/28/2021   GERD without esophagitis 11/28/2021   Speech disturbance 08/26/2021   Lumbar radiculopathy 04/25/2021   Thoracic aortic atherosclerosis 01/17/2021   GAD (generalized anxiety disorder)  12/07/2020   Elevated PSA, between 10 and less than 20 ng/ml 11/30/2020   Chronic kidney disease, stage 3b (HCC) 11/10/2020   Benign hypertensive kidney disease with chronic kidney disease 11/10/2020   Acquired trigger finger of left middle finger 08/07/2019   Carpal tunnel syndrome of right wrist 07/10/2019   Ulnar neuropathy 06/05/2019   Localized, secondary osteoarthritis of the  shoulder region 01/29/2019   Osteoarthritis of knee 01/29/2019   Chronic obstructive pulmonary disease (HCC) 09/24/2018   Cervical radiculopathy 08/14/2018   PAD (peripheral artery disease)    Acquired hypothyroidism 07/04/2014   Hypertrophy of prostate with urinary obstruction and other lower urinary tract symptoms (LUTS) 08/08/2013   Low serum testosterone  level 04/28/2013   Actinic keratosis 05/10/2010   History of malignant melanoma of skin 05/10/2010   PCP:  Marylynn Verneita CROME, MD Pharmacy:   [facility] - [place], [place] - [address] [place] [address] Phone: [phone] Fax: [phone]     Social Drivers of Health (SDOH) Social History: SDOH Screenings   Food Insecurity: No Food Insecurity (01/10/2024)  Housing: Low Risk (01/10/2024)  Transportation Needs: No Transportation Needs (01/10/2024)  Utilities: Not At Risk (10/01/2023)  Alcohol  Screen: Low Risk (09/26/2023)  Depression (PHQ2-9): Low Risk (02/13/2024)  Financial Resource Strain: Low Risk (01/10/2024)  Physical Activity: Inactive (01/10/2024)  Social Connections: Moderately Isolated (01/10/2024)  Stress: Stress Concern Present (01/10/2024)  Tobacco Use: Low Risk (03/16/2024)  Health Literacy: Inadequate Health Literacy (10/01/2023)   SDOH Interventions:     Readmission Risk Interventions     No data to display

## 2024-03-17 NOTE — ED Notes (Signed)
 Pt continuing to pull at lines and monitoring cables. Pt throwing his legs over the side of the bed rails.

## 2024-03-17 NOTE — ED Notes (Signed)
 Pt yelling out trying to get out of bed. Sitter remains at bedside.

## 2024-03-18 MED ORDER — OLANZAPINE 5 MG PO TBDP
5.0000 mg | ORAL_TABLET | Freq: Every day | ORAL | Status: DC
  Administered 2024-03-18: 5 mg via ORAL
  Filled 2024-03-18: qty 1

## 2024-03-18 NOTE — ED Notes (Signed)
 Pt found to be incontinent of urine. This RN, [name] RN, Theta EDT performed peri care and changed pt's brief, sheet and chuck pad. Pt trying to get out of bed, putting legs over side rail. Unable to redirect. Pt's stretcher in lowest, locked position.   Ativan  given at 0640 in attempt to help with pt's behavior.

## 2024-03-18 NOTE — Plan of Care (Signed)
   Problem: Safety: Goal: Non-violent Restraint(s) Outcome: Not Progressing   Problem: Education: Goal: Knowledge of General Education information will improve Description: Including pain rating scale, medication(s)/side effects and non-pharmacologic comfort measures Outcome: Not Progressing   Problem: Health Behavior/Discharge Planning: Goal: Ability to manage health-related needs will improve Outcome: Not Progressing   Problem: Clinical Measurements: Goal: Ability to maintain clinical measurements within normal limits will improve Outcome: Not Progressing Goal: Will remain free from infection Outcome: Not Progressing Goal: Diagnostic test results will improve Outcome: Not Progressing Goal: Respiratory complications will improve Outcome: Not Progressing Goal: Cardiovascular complication will be avoided Outcome: Not Progressing   Problem: Activity: Goal: Risk for activity intolerance will decrease Outcome: Not Progressing   Problem: Nutrition: Goal: Adequate nutrition will be maintained Outcome: Not Progressing   Problem: Coping: Goal: Level of anxiety will decrease Outcome: Not Progressing   Problem: Elimination: Goal: Will not experience complications related to bowel motility Outcome: Not Progressing Goal: Will not experience complications related to urinary retention Outcome: Not Progressing   Problem: Pain Managment: Goal: General experience of comfort will improve and/or be controlled Outcome: Not Progressing   Problem: Safety: Goal: Ability to remain free from injury will improve Outcome: Not Progressing   Problem: Skin Integrity: Goal: Risk for impaired skin integrity will decrease Outcome: Not Progressing

## 2024-03-18 NOTE — ED Notes (Signed)
Pt is snoring and sound asleep

## 2024-03-18 NOTE — ED Notes (Signed)
 RN to bedside to assess pt. Pt aarousable to voice and able to speak. Unable to understand parts of pt's speech. Pt appears restless and attempting to swing legs over bed. Legs placed back in bed and covered with blanket. Pt not in non-violent restraints.Safety sitter present. Bed locked in low position.

## 2024-03-18 NOTE — Progress Notes (Signed)
 [facility] Room 110 [facility] Liaison Note  Evaluation requested for the Hospice Home.  At this time, patient is not appropriate for the [facility].  [facility] will follow patient at [facility] SNF/LTC.  Referral submitted yesterday.   Please call with any hospice related questions or concerns.  Thank you for the opportunity to participate in this patient's care  [facility] [phone]

## 2024-03-18 NOTE — Progress Notes (Addendum)
 " PROGRESS NOTE    Theodore [name]  FMW:[id number] DOB: 06-Jan-1930 DOA: 03/16/2024 PCP: Marylynn Verneita CROME, MD     Brief Narrative:   Theodore [name] is a 89 y.o. year old male with medical history of HTN, HLD, CKDIIIa, hypothyroidism, COPD, hx of prostate cancer, and alzheimer's disease with behavioral disturbance presenting to the ED with worsening agitation ongoing for last few weeks. This has been being managed in the memory care unit but pt refusing care at the facility and being more aggressive towards the staff. On arrival to the ED patient was noted to be HDS stable. CBC without leukocytosis, mild anemia slightly below baseline. BMP with slight creatinine elevation consistent with CKD otherwise. Troponin elevated at 105, with repeat pending. Given elevated troponin, Dr. Dicky consulted regarding further workup for this and observation for serial monitoring. He discussed with cardiology who recommended if troponin is not downtrending then get echocardiogram but if downtrending and no complaint of chest pain with normal EKG he can be discharged from cardiac standpoint. Given this, [facility] contacted for admission.    Assessment & Plan:   Principal Problem:   Multifocal pneumonia Active Problems:   Elevated troponin   Acquired hypothyroidism   Hyperlipidemia   Moderate Alzheimer dementia with agitation (HCC)   PAD (peripheral artery disease)   Chronic obstructive pulmonary disease (HCC)   Chronic kidney disease, stage 3b (HCC)   GAD (generalized anxiety disorder)   GERD without esophagitis   Prostate cancer (HCC)  # Advanced Alzheimer dementia with behavioral disturbance Several weeks worsening agitation, reduced PO. Here no apparent reversible. No apparent infection (ua unremarkable, BCs ngtd, covid/flu/rsv neg), not hypoxic, no electrolyte or renal derangement, ct head nothing acute). Discussed with wife that this appears to be end-stage dementia. After thoughtful deliberation, she elects to  pursue full comfort care. Currently in memory care but they can't care for patient given current symptom burden. Hospice engaged as well. Yesterday very combative. Today much more sedate, so asking TOC to reach out to [facility]. Hospice re-evaluated today, per them not appropriate for inpatient hospice but can follow at next destination. - symptom mgmt with prn haldo, oral dissolving zyprexa , prn ativan , prn morphine   # Pulmonary infiltrates No hypoxia or cough but wife reports frequent aspiration. CT chest with multiple patchy opacities, suspect microaspiration. No clinical signs pna. Started on ceftriaxone /azithromycin  on admission. Do not think bacterial pneumonia, and regardless we have transitioned to comfort care  # Tropinemia Stable, mild. Echo with preserved EF, no RWMAs  # Hypothyroid # CKD 3a # PAD # COPD All stable   DVT prophylaxis: n/a Code Status: dnr/dni Family Communication: wife updated telephonically 1/6  Level of care: Med-Surg Status is: Inpatient Remains inpatient appropriate because: uncontrolled agitation    Consultants:  none  Procedures: none  Antimicrobials:  S/p ceftriaxone /azithromycin     Subjective: Asleep today  Objective: Vitals:   03/18/24 1030 03/18/24 1100 03/18/24 1130 03/18/24 1300  BP: (!) 179/62 (!) 180/58 (!) 178/72 (!) 179/85  Pulse: (!) 53 (!) 54 (!) 58 (!) 56  Resp: 15 17 14 15   Temp:      TempSrc:      SpO2: 100% 100% 100% 100%  Weight:      Height:        Intake/Output Summary (Last 24 hours) at 03/18/2024 1407 Last data filed at 03/17/2024 1841 Gross per 24 hour  Intake --  Output 300 ml  Net -300 ml   Filed Weights   03/16/24  1144  Weight: 73 kg    Examination:  General exam: chronically ill, malnourished Respiratory system: normal wob Cardiovascular system: S1 & S2 heard, RRR.   Gastrointestinal system: Abdomen is nondistended, soft  Central nervous system: Asleep Extremities: warm, no edema Skin: No  visible rashes, lesions or ulcers Psychiatry: asleep at time of exam    Data Reviewed: I have personally reviewed following labs and imaging studies  CBC: Recent Labs  Lab 03/16/24 1200 03/17/24 0429  WBC 8.0 7.6  HGB 11.7* 11.3*  HCT 35.6* 33.8*  MCV 95.2 94.2  PLT 285 290   Basic Metabolic Panel: Recent Labs  Lab 03/16/24 1200 03/16/24 1405 03/17/24 0429  NA 141  --  143  K 5.0  --  4.5  CL 103  --  108  CO2 27  --  24  GLUCOSE 121*  --  94  BUN 26*  --  21  CREATININE 1.31*  --  1.25*  CALCIUM  9.2  --  9.2  MG  --  2.1  --    GFR: Estimated Creatinine Clearance: 38.9 mL/min (A) (by C-G formula based on SCr of 1.25 mg/dL (H)). Liver Function Tests: No results for input(s): AST, ALT, ALKPHOS, BILITOT, PROT, ALBUMIN  in the last 168 hours. No results for input(s): LIPASE, AMYLASE in the last 168 hours. No results for input(s): AMMONIA in the last 168 hours. Coagulation Profile: No results for input(s): INR, PROTIME in the last 168 hours. Cardiac Enzymes: No results for input(s): CKTOTAL, CKMB, CKMBINDEX, TROPONINI in the last 168 hours. BNP (last 3 results) No results for input(s): PROBNP in the last 8760 hours. HbA1C: No results for input(s): HGBA1C in the last 72 hours. CBG: Recent Labs  Lab 03/17/24 1400 03/17/24 1434  GLUCAP 61* 139*   Lipid Profile: No results for input(s): CHOL, HDL, LDLCALC, TRIG, CHOLHDL, LDLDIRECT in the last 72 hours. Thyroid  Function Tests: No results for input(s): TSH, T4TOTAL, FREET4, T3FREE, THYROIDAB in the last 72 hours. Anemia Panel: No results for input(s): VITAMINB12, FOLATE, FERRITIN, TIBC, IRON, RETICCTPCT in the last 72 hours. Urine analysis:    Component Value Date/Time   COLORURINE YELLOW (A) 03/16/2024 1350   APPEARANCEUR CLEAR (A) 03/16/2024 1350   LABSPEC 1.012 03/16/2024 1350   PHURINE 6.0 03/16/2024 1350   GLUCOSEU NEGATIVE 03/16/2024  1350   HGBUR NEGATIVE 03/16/2024 1350   BILIRUBINUR NEGATIVE 03/16/2024 1350   KETONESUR NEGATIVE 03/16/2024 1350   PROTEINUR NEGATIVE 03/16/2024 1350   NITRITE NEGATIVE 03/16/2024 1350   LEUKOCYTESUR NEGATIVE 03/16/2024 1350   Sepsis Labs: @LABRCNTIP (procalcitonin:4,lacticidven:4)  ) Recent Results (from the past 240 hours)  Resp panel by RT-PCR (RSV, Flu A&B, Covid) Anterior Nasal Swab     Status: None   Collection Time: 03/16/24 12:57 PM   Specimen: Anterior Nasal Swab  Result Value Ref Range Status   SARS Coronavirus 2 by RT PCR NEGATIVE NEGATIVE Final    Comment: (NOTE) SARS-CoV-2 target nucleic acids are NOT DETECTED.  The SARS-CoV-2 RNA is generally detectable in upper respiratory specimens during the acute phase of infection. The lowest concentration of SARS-CoV-2 viral copies this assay can detect is 138 copies/mL. A negative result does not preclude SARS-Cov-2 infection and should not be used as the sole basis for treatment or other patient management decisions. A negative result may occur with  improper specimen collection/handling, submission of specimen other than nasopharyngeal swab, presence of viral mutation(s) within the areas targeted by this assay, and inadequate number of viral copies(<138 copies/mL). A  negative result must be combined with clinical observations, patient history, and epidemiological information. The expected result is Negative.  Fact Sheet for Patients:  bloggercourse.com  Fact Sheet for Healthcare Providers:  seriousbroker.it  This test is no t yet approved or cleared by the [place]  FDA and  has been authorized for detection and/or diagnosis of SARS-CoV-2 by FDA under an Emergency Use Authorization (EUA). This EUA will remain  in effect (meaning this test can be used) for the duration of the COVID-19 declaration under Section 564(b)(1) of the Act, 21 U.S.C.section 360bbb-3(b)(1),  unless the authorization is terminated  or revoked sooner.       Influenza A by PCR NEGATIVE NEGATIVE Final   Influenza B by PCR NEGATIVE NEGATIVE Final    Comment: (NOTE) The Xpert Xpress SARS-CoV-2/FLU/RSV plus assay is intended as an aid in the diagnosis of influenza from Nasopharyngeal swab specimens and should not be used as a sole basis for treatment. Nasal washings and aspirates are unacceptable for Xpert Xpress SARS-CoV-2/FLU/RSV testing.  Fact Sheet for Patients: bloggercourse.com  Fact Sheet for Healthcare Providers: seriousbroker.it  This test is not yet approved or cleared by the [place]  FDA and has been authorized for detection and/or diagnosis of SARS-CoV-2 by FDA under an Emergency Use Authorization (EUA). This EUA will remain in effect (meaning this test can be used) for the duration of the COVID-19 declaration under Section 564(b)(1) of the Act, 21 U.S.C. section 360bbb-3(b)(1), unless the authorization is terminated or revoked.     Resp Syncytial Virus by PCR NEGATIVE NEGATIVE Final    Comment: (NOTE) Fact Sheet for Patients: bloggercourse.com  Fact Sheet for Healthcare Providers: seriousbroker.it  This test is not yet approved or cleared by the [place]  FDA and has been authorized for detection and/or diagnosis of SARS-CoV-2 by FDA under an Emergency Use Authorization (EUA). This EUA will remain in effect (meaning this test can be used) for the duration of the COVID-19 declaration under Section 564(b)(1) of the Act, 21 U.S.C. section 360bbb-3(b)(1), unless the authorization is terminated or revoked.  Performed at [facility], [address] [place], [place] [address]   Blood Culture (routine x 2)     Status: None (Preliminary result)   Collection Time: 03/16/24  3:58 PM   Specimen: BLOOD  Result Value Ref Range Status    Specimen Description BLOOD RIGHT ANTECUBITAL  Final   Special Requests   Final    BOTTLES DRAWN AEROBIC AND ANAEROBIC Blood Culture adequate volume   Culture   Final    NO GROWTH 2 DAYS Performed at [facility], [address], [place], [place] [address]    Report Status PENDING  Incomplete  Blood Culture (routine x 2)     Status: None (Preliminary result)   Collection Time: 03/16/24  3:58 PM   Specimen: BLOOD  Result Value Ref Range Status   Specimen Description BLOOD BLOOD LEFT HAND  Final   Special Requests   Final    BOTTLES DRAWN AEROBIC AND ANAEROBIC Blood Culture results may not be optimal due to an inadequate volume of blood received in culture bottles   Culture   Final    NO GROWTH 2 DAYS Performed at [facility], [address], [place], [place] [address]    Report Status PENDING  Incomplete         Radiology Studies: ECHOCARDIOGRAM COMPLETE Result Date: 03/17/2024    ECHOCARDIOGRAM REPORT   Patient Name:   Akram A Achterberg Date of  Exam: 03/17/2024 Medical Rec #:  [id number]     Height:       76.0 in Accession #:    [id number]    Weight:       160.9 lb Date of Birth:  07-28-1929     BSA:          2.020 m Patient Age:    89 years      BP:           138/46 mmHg Patient Gender: M             HR:           61 bpm. Exam Location:  [facility] Procedure: 2D Echo, Cardiac Doppler and Color Doppler (Both Spectral and Color            Flow Doppler were utilized during procedure). Indications:     Elevated Troponin  History:         Patient has prior history of Echocardiogram examinations, most                  recent 09/05/2022.  Sonographer:     Rosina Dunk Referring Phys:  [id number] MORENE BATHE Diagnosing Phys: Deatrice Cage MD  Sonographer Comments: Technically difficult study due to poor echo windows and suboptimal parasternal window. Image acquisition challenging due to uncooperative patient. IMPRESSIONS  1. Left ventricular ejection fraction, by estimation, is 60 to  65%. The left ventricle has normal function. The left ventricle has no regional wall motion abnormalities. Left ventricular diastolic parameters are indeterminate.  2. Right ventricular systolic function is normal. The right ventricular size is normal.  3. The mitral valve is normal in structure. No evidence of mitral valve regurgitation. No evidence of mitral stenosis.  4. The aortic valve is calcified. Aortic valve regurgitation is not visualized. Aortic valve sclerosis/calcification is present, without any evidence of aortic stenosis.  5. The inferior vena cava is normal in size with greater than 50% respiratory variability, suggesting right atrial pressure of 3 mmHg.  6. Technically difficult study due to poor echo windows FINDINGS  Left Ventricle: Left ventricular ejection fraction, by estimation, is 60 to 65%. The left ventricle has normal function. The left ventricle has no regional wall motion abnormalities. The left ventricular internal cavity size was normal in size. There is  no left ventricular hypertrophy. Left ventricular diastolic parameters are indeterminate. Right Ventricle: The right ventricular size is normal. No increase in right ventricular wall thickness. Right ventricular systolic function is normal. Left Atrium: Left atrial size was normal in size. Right Atrium: Right atrial size was normal in size. Pericardium: There is no evidence of pericardial effusion. Mitral Valve: The mitral valve is normal in structure. No evidence of mitral valve regurgitation. No evidence of mitral valve stenosis. MV peak gradient, 4.7 mmHg. The mean mitral valve gradient is 2.0 mmHg. Tricuspid Valve: The tricuspid valve is normal in structure. Tricuspid valve regurgitation is not demonstrated. No evidence of tricuspid stenosis. Aortic Valve: The aortic valve is calcified. Aortic valve regurgitation is not visualized. Aortic valve sclerosis/calcification is present, without any evidence of aortic stenosis. Aortic  valve mean gradient measures 3.0 mmHg. Aortic valve peak gradient measures 5.0 mmHg. Aortic valve area, by VTI measures 2.65 cm. Pulmonic Valve: The pulmonic valve was normal in structure. Pulmonic valve regurgitation is not visualized. No evidence of pulmonic stenosis. Aorta: The aortic root is normal in size and structure. Venous: The inferior vena cava is normal in size with greater than 50% respiratory  variability, suggesting right atrial pressure of 3 mmHg. IAS/Shunts: No atrial level shunt detected by color flow Doppler.  LEFT VENTRICLE PLAX 2D LVIDd:         4.00 cm     Diastology LVIDs:         2.30 cm     LV e' medial:    4.79 cm/s LV PW:         1.00 cm     LV E/e' medial:  20.1 LV IVS:        1.00 cm     LV e' lateral:   5.44 cm/s LVOT diam:     1.90 cm     LV E/e' lateral: 17.7 LV SV:         77 LV SV Index:   38 LVOT Area:     2.84 cm LV IVRT:       102 msec  LV Volumes (MOD) LV vol d, MOD A2C: 50.2 ml LV vol d, MOD A4C: 38.2 ml LV vol s, MOD A2C: 26.8 ml LV vol s, MOD A4C: 17.4 ml LV SV MOD A2C:     23.4 ml LV SV MOD A4C:     38.2 ml LV SV MOD BP:      22.4 ml RIGHT VENTRICLE            IVC RV Basal diam:  3.30 cm    IVC diam: 1.70 cm RV S prime:     3.44 cm/s LEFT ATRIUM             Index        RIGHT ATRIUM           Index LA diam:        2.80 cm 1.39 cm/m   RA Area:     10.20 cm LA Vol (A2C):   27.1 ml 13.42 ml/m  RA Volume:   17.80 ml  8.81 ml/m LA Vol (A4C):   34.8 ml 17.23 ml/m LA Biplane Vol: 30.9 ml 15.30 ml/m  AORTIC VALVE AV Area (Vmax):    2.63 cm AV Area (Vmean):   2.61 cm AV Area (VTI):     2.65 cm AV Vmax:           112.00 cm/s AV Vmean:          75.600 cm/s AV VTI:            0.290 m AV Peak Grad:      5.0 mmHg AV Mean Grad:      3.0 mmHg LVOT Vmax:         104.00 cm/s LVOT Vmean:        69.700 cm/s LVOT VTI:          0.271 m LVOT/AV VTI ratio: 0.93  AORTA Ao Root diam: 3.70 cm MITRAL VALVE MV Area (PHT): 3.31 cm    SHUNTS MV Area VTI:   1.91 cm    Systemic VTI:  0.27 m MV  Peak grad:  4.7 mmHg    Systemic Diam: 1.90 cm MV Mean grad:  2.0 mmHg MV Vmax:       1.08 m/s MV Vmean:      67.5 cm/s MV Decel Time: 229 msec MV E velocity: 96.40 cm/s MV A velocity: 93.30 cm/s MV E/A ratio:  1.03 Deatrice Cage MD Electronically signed by Deatrice Cage MD Signature Date/Time: 03/17/2024/1:34:10 PM    Final    DG Toe 2nd Right Result Date: 03/16/2024 EXAM: 1 VIEW(S) XRAY  OF THE RIGHT TOES 03/16/2024 08:13:47 PM COMPARISON: Foot series earlier today. CLINICAL HISTORY: [id number] Fracture [id number] Fracture [id number]. FINDINGS: BONES AND JOINTS: No fracture, subluxation, or dislocation. Specifically, no fracture within the second proximal phalanx as questioned on prior foot series. SOFT TISSUES: The soft tissues are unremarkable. IMPRESSION: 1. No acute pony abnormality. Electronically signed by: Franky Crease MD 03/16/2024 08:20 PM EST RP Workstation: [id number]   CT Chest Wo Contrast Result Date: 03/16/2024 CLINICAL DATA:  Pneumonia EXAM: CT CHEST WITHOUT CONTRAST TECHNIQUE: Multidetector CT imaging of the chest was performed following the standard protocol without IV contrast. RADIATION DOSE REDUCTION: This exam was performed according to the departmental dose-optimization program which includes automated exposure control, adjustment of the mA and/or kV according to patient size and/or use of iterative reconstruction technique. COMPARISON:  April 19, 2021 FINDINGS: Cardiovascular: Aortic atherosclerosis. Normal cardiac size. No pericardial effusion. Extensive coronary artery calcifications are noted. Mediastinum/Nodes: No enlarged mediastinal or axillary lymph nodes. Thyroid  gland, trachea, and esophagus demonstrate no significant findings. Lungs/Pleura: No pneumothorax or pleural effusion is noted. Interval development of multiple patchy opacities seen in the left upper and predominantly left lower lobes, concerning for multifocal pneumonia. Minimal right posterior basilar subsegmental atelectasis is  noted. Upper Abdomen: Cholelithiasis. Musculoskeletal: No chest wall mass or suspicious bone lesions identified. IMPRESSION: 1. Interval development of multiple patchy opacities seen in the left upper and predominantly left lower lobes, concerning for multifocal pneumonia. 2. Extensive coronary artery calcifications are noted suggesting coronary artery disease. 3. Cholelithiasis. 4. Aortic atherosclerosis. Aortic Atherosclerosis (ICD10-I70.0). Electronically Signed   By: Lynwood Landy Raddle M.D.   On: 03/16/2024 15:10        Scheduled Meds:  OLANZapine  zydis  5 mg Oral QHS   sodium chloride  flush  3 mL Intravenous Q12H   Continuous Infusions:   LOS: 1 day     Devaughn KATHEE Ban, MD [facility]   If 7PM-7AM, please contact night-coverage [email or web address] Password [email or web address] 03/18/2024, 2:07 PM     "

## 2024-03-18 NOTE — TOC Progression Note (Signed)
 Transition of Care [facility]) - Progression Note    Patient Details  Name: Theodore [name] MRN: [id number] Date of Birth: May 31, 1929  [facility] CM/SW Contact  Victory Jackquline RAMAN, RN Phone Number: 03/18/2024, 4:13 PM  Clinical Narrative:    RNCM received a message from  the MD via secure chat asking if someone from [facility] was still coming out to assess the patient to return to their memory care with hospice, RNCM reached out to Alfonso with [facility] @ [phone] and she said that Amy from [facility] will be out to assess the patient tomorrow. RNCM will continue to follow for discharge planning needs.      Expected Discharge Plan: Skilled Nursing Facility Barriers to Discharge: Other (must enter comment)               Expected Discharge Plan and Services   Discharge Planning Services: CM Consult   Living arrangements for the past 2 months: Assisted Living Facility                 DME Arranged: N/A         HH Arranged: NA           Social Drivers of Health (SDOH) Interventions SDOH Screenings   Food Insecurity: No Food Insecurity (01/10/2024)  Housing: Low Risk (01/10/2024)  Transportation Needs: No Transportation Needs (01/10/2024)  Utilities: Not At Risk (10/01/2023)  Alcohol  Screen: Low Risk (09/26/2023)  Depression (PHQ2-9): Low Risk (02/13/2024)  Financial Resource Strain: Low Risk (01/10/2024)  Physical Activity: Inactive (01/10/2024)  Social Connections: Moderately Isolated (01/10/2024)  Stress: Stress Concern Present (01/10/2024)  Tobacco Use: Low Risk (03/16/2024)  Health Literacy: Inadequate Health Literacy (10/01/2023)    Readmission Risk Interventions     No data to display

## 2024-03-19 DIAGNOSIS — F02B11 Dementia in other diseases classified elsewhere, moderate, with agitation: Secondary | ICD-10-CM | POA: Diagnosis not present

## 2024-03-19 DIAGNOSIS — J188 Other pneumonia, unspecified organism: Secondary | ICD-10-CM | POA: Diagnosis not present

## 2024-03-19 DIAGNOSIS — G301 Alzheimer's disease with late onset: Secondary | ICD-10-CM | POA: Diagnosis not present

## 2024-03-19 DIAGNOSIS — N1832 Chronic kidney disease, stage 3b: Secondary | ICD-10-CM | POA: Diagnosis not present

## 2024-03-19 MED ORDER — QUETIAPINE FUMARATE 25 MG PO TABS
25.0000 mg | ORAL_TABLET | Freq: Every day | ORAL | Status: DC
  Administered 2024-03-19: 25 mg via ORAL
  Filled 2024-03-19: qty 1

## 2024-03-19 MED ORDER — HALOPERIDOL LACTATE 5 MG/ML IJ SOLN
2.0000 mg | Freq: Four times a day (QID) | INTRAMUSCULAR | Status: DC | PRN
  Administered 2024-03-19 – 2024-03-20 (×3): 2 mg via INTRAVENOUS
  Filled 2024-03-19 (×3): qty 1

## 2024-03-19 MED ORDER — MELATONIN 5 MG PO TABS
5.0000 mg | ORAL_TABLET | Freq: Every day | ORAL | Status: DC
  Administered 2024-03-19: 5 mg via ORAL
  Filled 2024-03-19: qty 1

## 2024-03-19 NOTE — Plan of Care (Signed)

## 2024-03-19 NOTE — Progress Notes (Signed)
" °  Progress Note   Patient: Theodore [name] FMW:[id number] DOB: 12-12-1929 DOA: 03/16/2024     2 DOS: the patient was seen and examined on 03/19/2024   Brief hospital course:  Theodore [name] is a 89 y.o. year old male with medical history of HTN, HLD, CKDIIIa, hypothyroidism, COPD, hx of prostate cancer, and alzheimer's disease with behavioral disturbance presenting to the ED with worsening agitation ongoing for last few weeks. This has been being managed in the memory care unit but pt refusing care at the facility and being more aggressive towards the staff.    Principal Problem:   Multifocal pneumonia Active Problems:   Elevated troponin   Acquired hypothyroidism   Hyperlipidemia   Moderate Alzheimer dementia with agitation (HCC)   PAD (peripheral artery disease)   Chronic obstructive pulmonary disease (HCC)   Chronic kidney disease, stage 3b (HCC)   GAD (generalized anxiety disorder)   GERD without esophagitis   Prostate cancer (HCC)   Assessment and Plan: # Advanced Alzheimer dementia with behavioral disturbance Several weeks worsening agitation, reduced PO.  After discussion with the family, patient was transition to comfort care. However, patient still has severe agitation at nighttime, requiring Ativan .  Memory unit could not accept the patient back.  Inpatient hospice has not accepted patient. Discussed with RN, patient has not been eating. At this point, I will try to control his behavior, will give as needed Haldol  at daytime.  Will try Seroquel  and melatonin at night.    # Pulmonary infiltrates No additional treatment due to comfort care  Elevated troponin Comfort care.   # Hypothyroid # CKD 3a # PAD # COPD All stable      Subjective:  Patient is sleepy, he was very agitated at the 2 AM, received IV Ativan .  He has not been eating or drinking.  Physical Exam: Vitals:   03/18/24 1522 03/18/24 1955 03/19/24 0500 03/19/24 0753  BP: (!) 177/73 (!) 156/71  (!)  166/66  Pulse: 73 86  68  Resp: 20   18  Temp: 98.2 F (36.8 C) 97.8 F (36.6 C)  98.1 F (36.7 C)  TempSrc: Axillary   Oral  SpO2: 99% 92%  100%  Weight:   70.5 kg   Height:       General exam: Ill-appearing and drowsy, Respiratory system: Clear to auscultation. Respiratory effort normal. Cardiovascular system: S1 & S2 heard, RRR. No JVD, murmurs, rubs, gallops or clicks. No pedal edema. Gastrointestinal system: Abdomen is nondistended, soft and nontender. No organomegaly or masses felt. Normal bowel sounds heard. Central nervous system: Drowsy and confused Extremities: Symmetric 5 x 5 power. Skin: No rashes, lesions or ulcers Psychiatry: Flat affect   Data Reviewed:  There are no new results to review at this time.  Family Communication: Wife updated at bedside  Disposition: Status is: Inpatient Remains inpatient appropriate because: Severity of disease, comfort care     Time spent: 50 minutes  Author: Murvin Mana, MD 03/19/2024 11:03 AM  For on call review [email or web address].    "

## 2024-03-19 NOTE — TOC Progression Note (Signed)
 Transition of Care [facility]) - Progression Note    Patient Details  Name: Theodore [name] MRN: [id number] Date of Birth: 08-May-1929  Transition of Care [facility]) CM/SW Contact  Dalia GORMAN Fuse, RN Phone Number: 03/19/2024, 10:11 AM  Clinical Narrative:     Plan for Amy with [facility] to come and assess the patient today to see if he is appropriate to return to their Memory Care with hospice.  TOC will continue to follow.    Expected Discharge Plan: Skilled Nursing Facility Barriers to Discharge: Other (must enter comment)               Expected Discharge Plan and Services   Discharge Planning Services: CM Consult   Living arrangements for the past 2 months: Assisted Living Facility                 DME Arranged: N/A         HH Arranged: NA           Social Drivers of Health (SDOH) Interventions SDOH Screenings   Food Insecurity: Patient Unable To Answer (03/18/2024)  Housing: Unknown (03/18/2024)  Transportation Needs: Patient Unable To Answer (03/18/2024)  Utilities: Patient Unable To Answer (03/18/2024)  Alcohol  Screen: Low Risk (09/26/2023)  Depression (PHQ2-9): Low Risk (02/13/2024)  Financial Resource Strain: Low Risk (01/10/2024)  Physical Activity: Inactive (01/10/2024)  Social Connections: Unknown (03/18/2024)  Recent Concern: Social Connections - Moderately Isolated (01/10/2024)  Stress: Stress Concern Present (01/10/2024)  Tobacco Use: Low Risk (03/16/2024)  Health Literacy: Inadequate Health Literacy (10/01/2023)    Readmission Risk Interventions     No data to display

## 2024-03-19 NOTE — Progress Notes (Signed)
 [facility] Room 110 [facility] Liaison Note  [facility] will evaluate patient for possible admission.  Hospice referral submitted on 1.5.26.    Please call with any hospice related questions or concerns  Thank you for the opportunity to participate in this patient's care  [facility] Liaison

## 2024-03-20 DIAGNOSIS — N1832 Chronic kidney disease, stage 3b: Secondary | ICD-10-CM | POA: Diagnosis not present

## 2024-03-20 DIAGNOSIS — G301 Alzheimer's disease with late onset: Secondary | ICD-10-CM | POA: Diagnosis not present

## 2024-03-20 DIAGNOSIS — J188 Other pneumonia, unspecified organism: Secondary | ICD-10-CM | POA: Diagnosis not present

## 2024-03-20 DIAGNOSIS — F02B11 Dementia in other diseases classified elsewhere, moderate, with agitation: Secondary | ICD-10-CM | POA: Diagnosis not present

## 2024-03-20 MED ORDER — HALOPERIDOL LACTATE 5 MG/ML IJ SOLN: 2.0000 mg | Freq: Four times a day (QID) | INTRAMUSCULAR | Status: AC | PRN

## 2024-03-20 MED ORDER — LORAZEPAM 2 MG/ML IJ SOLN: 1.0000 mg | INTRAMUSCULAR | Status: DC | PRN

## 2024-03-20 MED ORDER — MORPHINE SULFATE (PF) 2 MG/ML IV SOLN: 2.0000 mg | INTRAVENOUS | Status: AC | PRN

## 2024-03-20 MED ORDER — LORAZEPAM 2 MG/ML IJ SOLN: 1.0000 mg | INTRAMUSCULAR | Status: AC | PRN

## 2024-03-20 MED ORDER — GLYCOPYRROLATE 1 MG PO TABS: 1.0000 mg | ORAL_TABLET | ORAL | Status: AC | PRN

## 2024-03-20 MED ORDER — ACETAMINOPHEN 325 MG PO TABS: 650.0000 mg | ORAL_TABLET | Freq: Four times a day (QID) | ORAL | Status: AC | PRN

## 2024-03-20 NOTE — Progress Notes (Signed)
" °  Progress Note   Patient: Theodore [name] FMW:[id number] DOB: June 15, 1929 DOA: 03/16/2024     3 DOS: the patient was seen and examined on 03/20/2024   Brief hospital course:  Theodore [name] is a 89 y.o. year old male with medical history of HTN, HLD, CKDIIIa, hypothyroidism, COPD, hx of prostate cancer, and alzheimer's disease with behavioral disturbance presenting to the ED with worsening agitation ongoing for last few weeks. This has been being managed in the memory care unit but pt refusing care at the facility and being more aggressive towards the staff.    Principal Problem:   Multifocal pneumonia Active Problems:   Elevated troponin   Acquired hypothyroidism   Hyperlipidemia   Moderate Alzheimer dementia with agitation (HCC)   PAD (peripheral artery disease)   Chronic obstructive pulmonary disease (HCC)   Chronic kidney disease, stage 3b (HCC)   GAD (generalized anxiety disorder)   GERD without esophagitis   Prostate cancer (HCC)   Assessment and Plan: # Advanced Alzheimer dementia with behavioral disturbance Several weeks worsening agitation, reduced PO.  After discussion with the family, patient was transition to comfort care. However, patient still has severe agitation at nighttime, requiring Ativan .  Memory unit could not accept the patient back.  Inpatient hospice has not accepted patient. Patient continued to have severe agitation, not to be eating.  He slept overnight with melatonin and Seroquel , but is still very agitated today. I will ask hospice to reevaluate patient again to see if he qualifies for hospice home.  Continue comfort care     # Pulmonary infiltrates No additional treatment due to comfort care   Elevated troponin Comfort care.   # Hypothyroid # CKD 3a # PAD # COPD No need for treatment due to comfort care  Severe protein calorie malnutrition.      Subjective:  Patient slept last night, but still woke up with severe agitation.  Physical  Exam: Vitals:   03/19/24 0753 03/19/24 1937 03/20/24 0456 03/20/24 0753  BP: (!) 166/66 127/74  (!) 146/98  Pulse: 68 93  84  Resp: 18   (!) 26  Temp: 98.1 F (36.7 C) (!) 97.4 F (36.3 C)  98.7 F (37.1 C)  TempSrc: Oral   Oral  SpO2: 100% 99%  100%  Weight:   70.7 kg   Height:       General exam: Drowsy, appears severely malnourished. Respiratory system: Clear to auscultation. Respiratory effort normal. Cardiovascular system: Regular and tachycardic. No JVD, murmurs, rubs, gallops or clicks. No pedal edema. Gastrointestinal system: Abdomen is nondistended, soft and nontender. No organomegaly or masses felt. Normal bowel sounds heard. Central nervous system: Very agitated, totally confused. Extremities: Symmetric 5 x 5 power. Skin: No rashes, lesions or ulcers    Data Reviewed:  There are no new results to review at this time.  Family Communication: None  Disposition: Status is: Inpatient Remains inpatient appropriate because: Comfort care.     Time spent: 35 minutes  Author: Murvin Mana, MD 03/20/2024 12:06 PM  For on call review [email or web address].    "

## 2024-03-20 NOTE — Plan of Care (Signed)

## 2024-03-20 NOTE — Progress Notes (Signed)
 The patient's IV to his LFA came out while this writer was changing the dressing because the dressing was soiled. IV consult was put in for a new IV site.

## 2024-03-20 NOTE — TOC Transition Note (Signed)
 Transition of Care [facility]) - Discharge Note   Patient Details  Name: Theodore [name] MRN: [id number] Date of Birth: 10/03/1929  Transition of Care [facility]) CM/SW Contact:  Dalia GORMAN Fuse, RN Phone Number: 03/20/2024, 4:47 PM   Clinical Narrative:    Patient is medically ready for discharge to [facility]. TOC spoke with the patient's wife and she is in agreeement with the dc plan. She advised that she only received one form from [facility] and feels like it should be more. TOC sent secure message to Marinell and he will reach out.  TOC lvmm with [facility] intake [phone] to request amb auth. TOC lvmm with [facility] medical director, Dr. Marcey sar [phone] to request auth.   TOC received a call back from Dr. Sar and they are working on the request.  TOC lvmm with [facility] intake advising them to call 1C Unit Secretary with shara if it becomes available.  TOC leadership spoke with Dr. Sar and he advised the request is in progress. They will call Delphine Bring with the auth.  The Medical Necessity form has been completed. Transportation will need to be arranged via [facility] once ins shara is received.     Final next level of care: Hospice Medical Facility Barriers to Discharge: Barriers Resolved   Patient Goals and CMS Choice Patient states their goals for this hospitalization and ongoing recovery are:: Wife would like hosp[facility] care at [facility]          Discharge Placement                  Name of family member notified: Loa Hasten Patient and family notified of of transfer: 03/20/24  Discharge Plan and Services Additional resources added to the After Visit Summary for     Discharge Planning Services: CM Consult            DME Arranged: N/A         HH Arranged: NA          Social Drivers of Health (SDOH) Interventions SDOH Screenings   Food Insecurity: Patient Unable To Answer (03/18/2024)  Housing: Unknown (03/18/2024)  Transportation Needs: Patient Unable To  Answer (03/18/2024)  Utilities: Patient Unable To Answer (03/18/2024)  Alcohol  Screen: Low Risk (09/26/2023)  Depression (PHQ2-9): Low Risk (02/13/2024)  Financial Resource Strain: Low Risk (01/10/2024)  Physical Activity: Inactive (01/10/2024)  Social Connections: Unknown (03/18/2024)  Recent Concern: Social Connections - Moderately Isolated (01/10/2024)  Stress: Stress Concern Present (01/10/2024)  Tobacco Use: Low Risk (03/16/2024)  Health Literacy: Inadequate Health Literacy (10/01/2023)     Readmission Risk Interventions     No data to display

## 2024-03-20 NOTE — Care Management Important Message (Signed)
 Important Message  Patient Details  Name: Theodore [name] MRN: [id number] Date of Birth: [birth year]   Important Message Given:  Yes - Medicare IM     Anyelina Claycomb W, CMA 03/20/2024, 12:51 PM

## 2024-03-20 NOTE — TOC Progression Note (Signed)
 Transition of Care [facility]) - Progression Note    Patient Details  Name: Theodore [name] MRN: [id number] Date of Birth: [birth year]  [facility]) CM/SW Contact  Dalia GORMAN Fuse, RN Phone Number: 03/20/2024, 10:20 AM  Clinical Narrative:     TOC lvmm with Greig Bunting with [facility] to see if he is appropriate to return. TOC awaiting callback.  TOC will continue to follow.  Expected Discharge Plan: Skilled Nursing Facility Barriers to Discharge: Other (must enter comment)               Expected Discharge Plan and Services   Discharge Planning Services: CM Consult   Living arrangements for the past 2 months: Assisted Living Facility                 DME Arranged: N/A         HH Arranged: NA           Social Drivers of Health (SDOH) Interventions SDOH Screenings   Food Insecurity: Patient Unable To Answer (03/18/2024)  Housing: Unknown (03/18/2024)  Transportation Needs: Patient Unable To Answer (03/18/2024)  Utilities: Patient Unable To Answer (03/18/2024)  Alcohol  Screen: Low Risk (09/26/2023)  Depression (PHQ2-9): Low Risk (02/13/2024)  Financial Resource Strain: Low Risk (01/10/2024)  Physical Activity: Inactive (01/10/2024)  Social Connections: Unknown (03/18/2024)  Recent Concern: Social Connections - Moderately Isolated (01/10/2024)  Stress: Stress Concern Present (01/10/2024)  Tobacco Use: Low Risk (03/16/2024)  Health Literacy: Inadequate Health Literacy (10/01/2023)    Readmission Risk Interventions     No data to display

## 2024-03-20 NOTE — Discharge Summary (Signed)
 " Physician Discharge Summary   Patient: Theodore [name] MRN: [id number] DOB: 06-27-1929  Admit date:     03/16/2024  Discharge date: 03/20/2024  Discharge Physician: Murvin Mana   PCP: Marylynn Verneita CROME, MD   Recommendations at discharge:   Transfer to inpatient hospice.  Discharge Diagnoses: Principal Problem:   Multifocal pneumonia Active Problems:   Elevated troponin   Acquired hypothyroidism   Hyperlipidemia   Moderate Alzheimer dementia with agitation (HCC)   PAD (peripheral artery disease)   Chronic obstructive pulmonary disease (HCC)   Chronic kidney disease, stage 3b (HCC)   GAD (generalized anxiety disorder)   GERD without esophagitis   Prostate cancer (HCC)  Resolved Problems:   * No resolved hospital problems. *  Hospital Course:  Theodore [name] is a 89 y.o. year old male with medical history of HTN, HLD, CKDIIIa, hypothyroidism, COPD, hx of prostate cancer, and alzheimer's disease with behavioral disturbance presenting to the ED with worsening agitation ongoing for last few weeks. This has been being managed in the memory care unit but pt refusing care at the facility and being more aggressive towards the staff.  Patient was transition to comfort care after arriving in the hospital.  He is accepted to inpatient hospice today.  Assessment and Plan:  # Advanced Alzheimer dementia with behavioral disturbance Several weeks worsening agitation, reduced PO.  After discussion with the family, patient was transition to comfort care. However, patient still has severe agitation at nighttime, requiring Ativan .  Memory unit could not accept the patient back.  Inpatient hospice has not accepted patient. Patient continued to have severe agitation, not to be eating.  He slept overnight with melatonin and Seroquel , but is still very agitated today. I will ask hospice to reevaluate patient again to see if he qualifies for hospice home.  Continue comfort care     # Pulmonary  infiltrates No additional treatment due to comfort care   Elevated troponin Comfort care.   # Hypothyroid # CKD 3a # PAD # COPD No need for treatment due to comfort care   Severe protein calorie malnutrition.        Consultants: None Procedures performed: None  Disposition: Hospice care Diet recommendation:  Discharge Diet Orders (From admission, onward)     Start     Ordered   03/20/24 0000  Diet general       Comments: As tolerated   03/20/24 1344            DISCHARGE MEDICATION: Allergies as of 03/20/2024       Reactions   Penicillins Rash, Other (See Comments)   Other reaction(s): Other (see comments) Other reaction(s): UNKNOWN   Benzonatate     Pruritic rash   Lexapro  [escitalopram ] Other (See Comments)   Increased hand tremor   Molnupiravir     Pruritic rash on neck        Medication List     STOP taking these medications    Acetaminophen  500 MG capsule Replaced by: acetaminophen  325 MG tablet   ALPRAZolam  0.5 MG tablet Commonly known as: XANAX    ALPRAZolam  1 MG tablet Commonly known as: XANAX    ascorbic acid  500 MG tablet Commonly known as: VITAMIN C    brexpiprazole  1 MG Tabs tablet Commonly known as: Rexulti    busPIRone  10 MG tablet Commonly known as: BUSPAR    cyanocobalamin  500 MCG tablet Commonly known as: VITAMIN B12   cycloSPORINE  0.05 % ophthalmic emulsion Commonly known as: RESTASIS    ezetimibe  10 MG tablet Commonly  known as: ZETIA    famotidine  20 MG tablet Commonly known as: PEPCID    fluticasone  50 MCG/ACT nasal spray Commonly known as: FLONASE    FOLATE PO   levothyroxine  100 MCG tablet Commonly known as: SYNTHROID    LORazepam  0.5 MG tablet Commonly known as: ATIVAN  Replaced by: LORazepam  2 MG/ML injection   Lorazepam  Powd   Misc Intestinal Flora Regulat Caps   polyethylene glycol 17 g packet Commonly known as: MIRALAX  / GLYCOLAX    Rexulti  2 MG Tabs tablet Generic drug: brexpiprazole     saccharomyces boulardii 250 MG capsule Commonly known as: FLORASTOR   senna 8.6 MG Tabs tablet Commonly known as: SENOKOT   sertraline  100 MG tablet Commonly known as: ZOLOFT    Systane 0.4-0.3 % Soln Generic drug: Polyethyl Glycol-Propyl Glycol   Vitamin D3 125 MCG (5000 UT) Tabs       TAKE these medications    acetaminophen  325 MG tablet Commonly known as: TYLENOL  Take 2 tablets (650 mg total) by mouth every 6 (six) hours as needed for fever (Fever >/= 101). Replaces: Acetaminophen  500 MG capsule   glycopyrrolate  1 MG tablet Commonly known as: ROBINUL  Take 1 tablet (1 mg total) by mouth every 4 (four) hours as needed (excessive secretions).   haloperidol  lactate 5 MG/ML injection Commonly known as: HALDOL  Inject 0.4 mLs (2 mg total) into the vein every 6 (six) hours as needed.   LORazepam  2 MG/ML injection Commonly known as: ATIVAN  Inject 0.5 mLs (1 mg total) into the vein every 4 (four) hours as needed for anxiety. Replaces: LORazepam  0.5 MG tablet   morphine  (PF) 2 MG/ML injection Inject 1-2 mLs (2-4 mg total) into the vein every 30 (thirty) minutes as needed (severe pain, dyspnea; if not achieving comfort, call MD for infusion orders).   QUEtiapine  50 MG tablet Commonly known as: SEROquel  Take 1 tablet (50 mg total) by mouth at bedtime.        Discharge Exam: Filed Weights   03/16/24 1144 03/19/24 0500 03/20/24 0456  Weight: 73 kg 70.5 kg 70.7 kg   General exam: Drowsy, appears severely malnourished. Respiratory system: Clear to auscultation. Respiratory effort normal. Cardiovascular system: Regular and tachycardic. No JVD, murmurs, rubs, gallops or clicks. No pedal edema. Gastrointestinal system: Abdomen is nondistended, soft and nontender. No organomegaly or masses felt. Normal bowel sounds heard. Central nervous system: Very agitated, totally confused. Extremities: Symmetric 5 x 5 power. Skin: No rashes, lesions or ulcers  Condition at discharge:  poor  The results of significant diagnostics from this hospitalization (including imaging, microbiology, ancillary and laboratory) are listed below for reference.   Imaging Studies: ECHOCARDIOGRAM COMPLETE Result Date: 03/17/2024    ECHOCARDIOGRAM REPORT   Patient Name:   Theodore [name] [place] Date of Exam: 03/17/2024 Medical Rec #:  [id number]     Height:       76.0 in Accession #:    [id number]    Weight:       160.9 lb Date of Birth:  November 12, 1929     BSA:          2.020 m Patient Age:    89 years      BP:           138/46 mmHg Patient Gender: M             HR:           61 bpm. Exam Location:  [facility] Procedure: 2D Echo, Cardiac Doppler and Color Doppler (Both Spectral and Color  Flow Doppler were utilized during procedure). Indications:     Elevated Troponin  History:         Patient has prior history of Echocardiogram examinations, most                  recent 09/05/2022.  Sonographer:     Rosina Dunk Referring Phys:  [id number] MORENE BATHE Diagnosing Phys: Deatrice Cage MD  Sonographer Comments: Technically difficult study due to poor echo windows and suboptimal parasternal window. Image acquisition challenging due to uncooperative patient. IMPRESSIONS  1. Left ventricular ejection fraction, by estimation, is 60 to 65%. The left ventricle has normal function. The left ventricle has no regional wall motion abnormalities. Left ventricular diastolic parameters are indeterminate.  2. Right ventricular systolic function is normal. The right ventricular size is normal.  3. The mitral valve is normal in structure. No evidence of mitral valve regurgitation. No evidence of mitral stenosis.  4. The aortic valve is calcified. Aortic valve regurgitation is not visualized. Aortic valve sclerosis/calcification is present, without any evidence of aortic stenosis.  5. The inferior vena cava is normal in size with greater than 50% respiratory variability, suggesting right atrial pressure of 3 mmHg.  6. Technically  difficult study due to poor echo windows FINDINGS  Left Ventricle: Left ventricular ejection fraction, by estimation, is 60 to 65%. The left ventricle has normal function. The left ventricle has no regional wall motion abnormalities. The left ventricular internal cavity size was normal in size. There is  no left ventricular hypertrophy. Left ventricular diastolic parameters are indeterminate. Right Ventricle: The right ventricular size is normal. No increase in right ventricular wall thickness. Right ventricular systolic function is normal. Left Atrium: Left atrial size was normal in size. Right Atrium: Right atrial size was normal in size. Pericardium: There is no evidence of pericardial effusion. Mitral Valve: The mitral valve is normal in structure. No evidence of mitral valve regurgitation. No evidence of mitral valve stenosis. MV peak gradient, 4.7 mmHg. The mean mitral valve gradient is 2.0 mmHg. Tricuspid Valve: The tricuspid valve is normal in structure. Tricuspid valve regurgitation is not demonstrated. No evidence of tricuspid stenosis. Aortic Valve: The aortic valve is calcified. Aortic valve regurgitation is not visualized. Aortic valve sclerosis/calcification is present, without any evidence of aortic stenosis. Aortic valve mean gradient measures 3.0 mmHg. Aortic valve peak gradient measures 5.0 mmHg. Aortic valve area, by VTI measures 2.65 cm. Pulmonic Valve: The pulmonic valve was normal in structure. Pulmonic valve regurgitation is not visualized. No evidence of pulmonic stenosis. Aorta: The aortic root is normal in size and structure. Venous: The inferior vena cava is normal in size with greater than 50% respiratory variability, suggesting right atrial pressure of 3 mmHg. IAS/Shunts: No atrial level shunt detected by color flow Doppler.  LEFT VENTRICLE PLAX 2D LVIDd:         4.00 cm     Diastology LVIDs:         2.30 cm     LV e' medial:    4.79 cm/s LV PW:         1.00 cm     LV E/e' medial:  20.1  LV IVS:        1.00 cm     LV e' lateral:   5.44 cm/s LVOT diam:     1.90 cm     LV E/e' lateral: 17.7 LV SV:         77 LV SV Index:   38 LVOT  Area:     2.84 cm LV IVRT:       102 msec  LV Volumes (MOD) LV vol d, MOD A2C: 50.2 ml LV vol d, MOD A4C: 38.2 ml LV vol s, MOD A2C: 26.8 ml LV vol s, MOD A4C: 17.4 ml LV SV MOD A2C:     23.4 ml LV SV MOD A4C:     38.2 ml LV SV MOD BP:      22.4 ml RIGHT VENTRICLE            IVC RV Basal diam:  3.30 cm    IVC diam: 1.70 cm RV S prime:     3.44 cm/s LEFT ATRIUM             Index        RIGHT ATRIUM           Index LA diam:        2.80 cm 1.39 cm/m   RA Area:     10.20 cm LA Vol (A2C):   27.1 ml 13.42 ml/m  RA Volume:   17.80 ml  8.81 ml/m LA Vol (A4C):   34.8 ml 17.23 ml/m LA Biplane Vol: 30.9 ml 15.30 ml/m  AORTIC VALVE AV Area (Vmax):    2.63 cm AV Area (Vmean):   2.61 cm AV Area (VTI):     2.65 cm AV Vmax:           112.00 cm/s AV Vmean:          75.600 cm/s AV VTI:            0.290 m AV Peak Grad:      5.0 mmHg AV Mean Grad:      3.0 mmHg LVOT Vmax:         104.00 cm/s LVOT Vmean:        69.700 cm/s LVOT VTI:          0.271 m LVOT/AV VTI ratio: 0.93  AORTA Ao Root diam: 3.70 cm MITRAL VALVE MV Area (PHT): 3.31 cm    SHUNTS MV Area VTI:   1.91 cm    Systemic VTI:  0.27 m MV Peak grad:  4.7 mmHg    Systemic Diam: 1.90 cm MV Mean grad:  2.0 mmHg MV Vmax:       1.08 m/s MV Vmean:      67.5 cm/s MV Decel Time: 229 msec MV E velocity: 96.40 cm/s MV A velocity: 93.30 cm/s MV E/A ratio:  1.03 Deatrice Cage MD Electronically signed by Deatrice Cage MD Signature Date/Time: 03/17/2024/1:34:10 PM    Final    DG Toe 2nd Right Result Date: 03/16/2024 EXAM: 1 VIEW(S) XRAY OF THE RIGHT TOES 03/16/2024 08:13:47 PM COMPARISON: Foot series earlier today. CLINICAL HISTORY: [id number] Fracture [id number] Fracture [id number]. FINDINGS: BONES AND JOINTS: No fracture, subluxation, or dislocation. Specifically, no fracture within the second proximal phalanx as questioned on prior foot series. SOFT  TISSUES: The soft tissues are unremarkable. IMPRESSION: 1. No acute pony abnormality. Electronically signed by: Franky Crease MD 03/16/2024 08:20 PM EST RP Workstation: [id number]   CT Chest Wo Contrast Result Date: 03/16/2024 CLINICAL DATA:  Pneumonia EXAM: CT CHEST WITHOUT CONTRAST TECHNIQUE: Multidetector CT imaging of the chest was performed following the standard protocol without IV contrast. RADIATION DOSE REDUCTION: This exam was performed according to the departmental dose-optimization program which includes automated exposure control, adjustment of the mA and/or kV according to patient size and/or use of iterative reconstruction technique. COMPARISON:  April 19, 2021 FINDINGS: Cardiovascular: Aortic atherosclerosis. Normal cardiac size. No pericardial effusion. Extensive coronary artery calcifications are noted. Mediastinum/Nodes: No enlarged mediastinal or axillary lymph nodes. Thyroid  gland, trachea, and esophagus demonstrate no significant findings. Lungs/Pleura: No pneumothorax or pleural effusion is noted. Interval development of multiple patchy opacities seen in the left upper and predominantly left lower lobes, concerning for multifocal pneumonia. Minimal right posterior basilar subsegmental atelectasis is noted. Upper Abdomen: Cholelithiasis. Musculoskeletal: No chest wall mass or suspicious bone lesions identified. IMPRESSION: 1. Interval development of multiple patchy opacities seen in the left upper and predominantly left lower lobes, concerning for multifocal pneumonia. 2. Extensive coronary artery calcifications are noted suggesting coronary artery disease. 3. Cholelithiasis. 4. Aortic atherosclerosis. Aortic Atherosclerosis (ICD10-I70.0). Electronically Signed   By: Lynwood Landy Raddle M.D.   On: 03/16/2024 15:10   DG Chest Portable 1 View Result Date: 03/16/2024 CLINICAL DATA:  elevated trop, eval for thoracic process, agitation/change in dementia pattern EXAM: PORTABLE CHEST 1 VIEW  COMPARISON:  April 19, 2021 FINDINGS: Evaluation is limited by rotation. Question increased nodular fullness along the RIGHT perihilar border, possibly due to rotation. Cardiac silhouette is otherwise similar compared to prior given differences in technique. No pleural effusion or pneumothorax. Patchy bibasilar perihilar predominant reticulonodular opacities. IMPRESSION: 1. Patchy bibasilar perihilar predominant opacities could reflect infection, aspiration or atelectasis. 2. Rounded opacity along the RIGHT perihilar border, possibly summation artifact due to patient rotation but technically indeterminate. Recommend repeat PA and lateral chest radiograph after resolution of acute symptomatology for improved comparison. Electronically Signed   By: Corean Salter M.D.   On: 03/16/2024 13:15   CT Head Wo Contrast Result Date: 03/16/2024 EXAM: CT HEAD WITHOUT CONTRAST 03/16/2024 12:21:00 PM TECHNIQUE: CT of the head was performed without the administration of intravenous contrast. Automated exposure control, iterative reconstruction, and/or weight based adjustment of the mA/kV was utilized to reduce the radiation dose to as low as reasonably achievable. COMPARISON: CT of the head dated 09/18/2022. CLINICAL HISTORY: Delirium; Dementia with behavioral disturbance. May have had a fall, grossly evaluate for intracranial trauma. FINDINGS: BRAIN AND VENTRICLES: No acute hemorrhage. No evidence of acute infarct. Nonspecific periventricular and subcortical white matter hypoattenuation, favored to reflect chronic microvascular ischemic changes. Generalized volume loss. No hydrocephalus. No extra-axial collection. No mass effect or midline shift. Atherosclerotic calcifications of carotid siphons and intracranial vertebral arteries. ORBITS: Bilateral lens replacement. SINUSES: No acute abnormality. SOFT TISSUES AND SKULL: No acute soft tissue abnormality. No skull fracture. IMPRESSION: 1. No acute intracranial abnormality,  similar to 09/18/22. 2. Nonspecific periventricular and subcortical white matter hypoattenuation, favored to reflect chronic microvascular ischemic changes. Electronically signed by: Evalene Coho MD 03/16/2024 12:26 PM EST RP Workstation: [id number]   DG Foot 2 Views Right Result Date: 03/16/2024 EXAM: 2 VIEW(S) XRAY OF THE RIGHT FOOT 03/16/2024 12:12:00 PM COMPARISON: None available. CLINICAL HISTORY: fall, ? injury to R 2nd toe FINDINGS: BONES AND JOINTS: Possible nondisplaced fracture of the distal aspect of the proximal phalanx of the second toe. Difficult to confirm on lateral imaging due to the overlap of bony structures. Consider dedicated imaging of the second toe, ideally with 3 views excluding adjacent overlapping bony structures. Plantar heel spur. Degenerative changes of the distal interphalangeal joints. No malalignment. SOFT TISSUES: Atherosclerotic vascular calcifications. IMPRESSION: 1. Possible nondisplaced fracture of the distal aspect of the proximal phalanx of the second toe, difficult to confirm on lateral imaging due to overlap of bony structures; differential considerations include projectional artifact or a nutrient canal. 2. Recommend  dedicated 3-view radiographs of the second toe to further evaluate; if initial dedicated radiographs are negative but symptoms persist, consider repeat radiographs in 7-10 days or cross-sectional imaging (CT or MRI) for occult fracture. Electronically signed by: Waddell Calk MD 03/16/2024 12:23 PM EST RP Workstation: [id number]    Microbiology: Results for orders placed or performed during the hospital encounter of 03/16/24  Resp panel by RT-PCR (RSV, Flu A&B, Covid) Anterior Nasal Swab     Status: None   Collection Time: 03/16/24 12:57 PM   Specimen: Anterior Nasal Swab  Result Value Ref Range Status   SARS Coronavirus 2 by RT PCR NEGATIVE NEGATIVE Final    Comment: (NOTE) SARS-CoV-2 target nucleic acids are NOT DETECTED.  The SARS-CoV-2 RNA  is generally detectable in upper respiratory specimens during the acute phase of infection. The lowest concentration of SARS-CoV-2 viral copies this assay can detect is 138 copies/mL. A negative result does not preclude SARS-Cov-2 infection and should not be used as the sole basis for treatment or other patient management decisions. A negative result may occur with  improper specimen collection/handling, submission of specimen other than nasopharyngeal swab, presence of viral mutation(s) within the areas targeted by this assay, and inadequate number of viral copies(<138 copies/mL). A negative result must be combined with clinical observations, patient history, and epidemiological information. The expected result is Negative.  Fact Sheet for Patients:  bloggercourse.com  Fact Sheet for Healthcare Providers:  seriousbroker.it  This test is no t yet approved or cleared by the [place]  FDA and  has been authorized for detection and/or diagnosis of SARS-CoV-2 by FDA under an Emergency Use Authorization (EUA). This EUA will remain  in effect (meaning this test can be used) for the duration of the COVID-19 declaration under Section 564(b)(1) of the Act, 21 U.S.C.section 360bbb-3(b)(1), unless the authorization is terminated  or revoked sooner.       Influenza A by PCR NEGATIVE NEGATIVE Final   Influenza B by PCR NEGATIVE NEGATIVE Final    Comment: (NOTE) The Xpert Xpress SARS-CoV-2/FLU/RSV plus assay is intended as an aid in the diagnosis of influenza from Nasopharyngeal swab specimens and should not be used as a sole basis for treatment. Nasal washings and aspirates are unacceptable for Xpert Xpress SARS-CoV-2/FLU/RSV testing.  Fact Sheet for Patients: bloggercourse.com  Fact Sheet for Healthcare Providers: seriousbroker.it  This test is not yet approved or cleared by the  [place]  FDA and has been authorized for detection and/or diagnosis of SARS-CoV-2 by FDA under an Emergency Use Authorization (EUA). This EUA will remain in effect (meaning this test can be used) for the duration of the COVID-19 declaration under Section 564(b)(1) of the Act, 21 U.S.C. section 360bbb-3(b)(1), unless the authorization is terminated or revoked.     Resp Syncytial Virus by PCR NEGATIVE NEGATIVE Final    Comment: (NOTE) Fact Sheet for Patients: bloggercourse.com  Fact Sheet for Healthcare Providers: seriousbroker.it  This test is not yet approved or cleared by the [place]  FDA and has been authorized for detection and/or diagnosis of SARS-CoV-2 by FDA under an Emergency Use Authorization (EUA). This EUA will remain in effect (meaning this test can be used) for the duration of the COVID-19 declaration under Section 564(b)(1) of the Act, 21 U.S.C. section 360bbb-3(b)(1), unless the authorization is terminated or revoked.  Performed at [facility], [address] [place], [place] [address]   Blood Culture (routine x 2)     Status: None (Preliminary result)   Collection Time:  03/16/24  3:58 PM   Specimen: BLOOD  Result Value Ref Range Status   Specimen Description BLOOD RIGHT ANTECUBITAL  Final   Special Requests   Final    BOTTLES DRAWN AEROBIC AND ANAEROBIC Blood Culture adequate volume   Culture   Final    NO GROWTH 3 DAYS Performed at [facility], [address] [place], [place] [address]    Report Status PENDING  Incomplete  Blood Culture (routine x 2)     Status: None (Preliminary result)   Collection Time: 03/16/24  3:58 PM   Specimen: BLOOD  Result Value Ref Range Status   Specimen Description BLOOD BLOOD LEFT HAND  Final   Special Requests   Final    BOTTLES DRAWN AEROBIC AND ANAEROBIC Blood Culture results may not be optimal due to an inadequate volume of blood received  in culture bottles   Culture   Final    NO GROWTH 3 DAYS Performed at [facility], [address] [place], [place] [address]    Report Status PENDING  Incomplete    Labs: CBC: Recent Labs  Lab 03/16/24 1200 03/17/24 0429  WBC 8.0 7.6  HGB 11.7* 11.3*  HCT 35.6* 33.8*  MCV 95.2 94.2  PLT 285 290   Basic Metabolic Panel: Recent Labs  Lab 03/16/24 1200 03/16/24 1405 03/17/24 0429  NA 141  --  143  K 5.0  --  4.5  CL 103  --  108  CO2 27  --  24  GLUCOSE 121*  --  94  BUN 26*  --  21  CREATININE 1.31*  --  1.25*  CALCIUM  9.2  --  9.2  MG  --  2.1  --    Liver Function Tests: No results for input(s): AST, ALT, ALKPHOS, BILITOT, PROT, ALBUMIN  in the last 168 hours. CBG: Recent Labs  Lab 03/17/24 1400 03/17/24 1434  GLUCAP 61* 139*    Discharge time spent: 35 minutes.  Signed: Murvin Mana, MD [facility] 03/20/2024 "

## 2024-03-20 NOTE — Progress Notes (Signed)
 [facility] Liaison Note   Received request from Transitions of Care Manger for family interest in  [facility].  Eligibility has been confirmed.  Met/Spoke with spouse  to confirm interest and explain services. Spouse  agreeable to transfer today.  Transitions of Care manager aware.  RN please call report to [phone](Hospice Home) prior to patient leaving the unit.  Please send signed and completed DNR with patient at discharge.  Thank you  Marinell Nova,  [facility] Liaison [phone]

## 2024-03-21 LAB — CULTURE, BLOOD (ROUTINE X 2)
Culture: NO GROWTH
Culture: NO GROWTH
Special Requests: ADEQUATE

## 2024-04-14 ENCOUNTER — Ambulatory Visit (INDEPENDENT_AMBULATORY_CARE_PROVIDER_SITE_OTHER): Admitting: Vascular Surgery

## 2024-04-14 ENCOUNTER — Encounter (INDEPENDENT_AMBULATORY_CARE_PROVIDER_SITE_OTHER)

## 2024-05-13 ENCOUNTER — Ambulatory Visit: Admitting: Podiatry

## 2024-06-03 ENCOUNTER — Ambulatory Visit: Admitting: Dermatology

## 2024-10-03 ENCOUNTER — Ambulatory Visit
